# Patient Record
Sex: Male | Born: 1945 | Race: Black or African American | Hispanic: No | Marital: Married | Smoking: Never smoker
Health system: Southern US, Community
[De-identification: ages and names within clinical notes are randomized; demographics above are authoritative.]

## PROBLEM LIST (undated history)

## (undated) DIAGNOSIS — M1990 Unspecified osteoarthritis, unspecified site: Secondary | ICD-10-CM

## (undated) DIAGNOSIS — E785 Hyperlipidemia, unspecified: Secondary | ICD-10-CM

## (undated) DIAGNOSIS — D126 Benign neoplasm of colon, unspecified: Secondary | ICD-10-CM

## (undated) DIAGNOSIS — K625 Hemorrhage of anus and rectum: Secondary | ICD-10-CM

## (undated) DIAGNOSIS — K760 Fatty (change of) liver, not elsewhere classified: Secondary | ICD-10-CM

## (undated) DIAGNOSIS — E66811 Obesity, class 1: Secondary | ICD-10-CM

## (undated) DIAGNOSIS — K56609 Unspecified intestinal obstruction, unspecified as to partial versus complete obstruction: Secondary | ICD-10-CM

## (undated) DIAGNOSIS — K648 Other hemorrhoids: Secondary | ICD-10-CM

## (undated) DIAGNOSIS — K5900 Constipation, unspecified: Secondary | ICD-10-CM

## (undated) DIAGNOSIS — E669 Obesity, unspecified: Secondary | ICD-10-CM

## (undated) DIAGNOSIS — D649 Anemia, unspecified: Secondary | ICD-10-CM

## (undated) DIAGNOSIS — M549 Dorsalgia, unspecified: Secondary | ICD-10-CM

## (undated) DIAGNOSIS — N281 Cyst of kidney, acquired: Secondary | ICD-10-CM

## (undated) DIAGNOSIS — I10 Essential (primary) hypertension: Secondary | ICD-10-CM

## (undated) DIAGNOSIS — M810 Age-related osteoporosis without current pathological fracture: Secondary | ICD-10-CM

## (undated) DIAGNOSIS — R7303 Prediabetes: Secondary | ICD-10-CM

## (undated) DIAGNOSIS — E559 Vitamin D deficiency, unspecified: Secondary | ICD-10-CM

## (undated) DIAGNOSIS — F419 Anxiety disorder, unspecified: Secondary | ICD-10-CM

## (undated) DIAGNOSIS — R739 Hyperglycemia, unspecified: Secondary | ICD-10-CM

## (undated) DIAGNOSIS — K649 Unspecified hemorrhoids: Secondary | ICD-10-CM

## (undated) DIAGNOSIS — R933 Abnormal findings on diagnostic imaging of other parts of digestive tract: Secondary | ICD-10-CM

## (undated) DIAGNOSIS — J189 Pneumonia, unspecified organism: Secondary | ICD-10-CM

## (undated) DIAGNOSIS — R351 Nocturia: Secondary | ICD-10-CM

## (undated) HISTORY — DX: Obesity, class 1: E66.811

## (undated) HISTORY — DX: Unspecified intestinal obstruction, unspecified as to partial versus complete obstruction: K56.609

## (undated) HISTORY — DX: Benign neoplasm of colon, unspecified: D12.6

## (undated) HISTORY — DX: Nocturia: R35.1

## (undated) HISTORY — DX: Hyperlipidemia, unspecified: E78.5

## (undated) HISTORY — DX: Constipation, unspecified: K59.00

## (undated) HISTORY — DX: Anxiety disorder, unspecified: F41.9

## (undated) HISTORY — DX: Essential (primary) hypertension: I10

## (undated) HISTORY — DX: Unspecified osteoarthritis, unspecified site: M19.90

## (undated) HISTORY — PX: COLONOSCOPY: SHX174

## (undated) HISTORY — DX: Anemia, unspecified: D64.9

## (undated) HISTORY — PX: TONSILLECTOMY: SUR1361

## (undated) HISTORY — DX: Age-related osteoporosis without current pathological fracture: M81.0

## (undated) HISTORY — DX: Abnormal findings on diagnostic imaging of other parts of digestive tract: R93.3

## (undated) HISTORY — DX: Cyst of kidney, acquired: N28.1

## (undated) HISTORY — DX: Hyperglycemia, unspecified: R73.9

## (undated) HISTORY — DX: Unspecified hemorrhoids: K64.9

## (undated) HISTORY — DX: Other hemorrhoids: K64.8

## (undated) HISTORY — DX: Vitamin D deficiency, unspecified: E55.9

## (undated) HISTORY — DX: Hemorrhage of anus and rectum: K62.5

## (undated) HISTORY — DX: Obesity, unspecified: E66.9

## (undated) HISTORY — PX: CATARACT EXTRACTION W/ INTRAOCULAR LENS IMPLANT: SHX1309

## (undated) HISTORY — DX: Dorsalgia, unspecified: M54.9

---
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
## 1987-07-01 HISTORY — PX: FRACTURE SURGERY: SHX138

## 2003-07-01 DIAGNOSIS — K648 Other hemorrhoids: Secondary | ICD-10-CM

## 2003-07-01 DIAGNOSIS — K760 Fatty (change of) liver, not elsewhere classified: Secondary | ICD-10-CM

## 2003-07-01 HISTORY — DX: Fatty (change of) liver, not elsewhere classified: K76.0

## 2003-07-01 HISTORY — DX: Other hemorrhoids: K64.8

## 2003-07-01 HISTORY — PX: HEMORRHOID BANDING: SHX5850

## 2008-06-30 HISTORY — PX: KNEE SURGERY: SHX244

## 2010-06-30 HISTORY — PX: KNEE SURGERY: SHX244

## 2011-12-15 LAB — PULMONARY FUNCTION TEST

## 2012-05-30 LAB — HM COLONOSCOPY

## 2012-10-07 ENCOUNTER — Encounter: Payer: Self-pay | Admitting: Family Medicine

## 2012-10-07 ENCOUNTER — Ambulatory Visit (INDEPENDENT_AMBULATORY_CARE_PROVIDER_SITE_OTHER): Admitting: Family Medicine

## 2012-10-07 ENCOUNTER — Ambulatory Visit (HOSPITAL_BASED_OUTPATIENT_CLINIC_OR_DEPARTMENT_OTHER)
Admission: RE | Admit: 2012-10-07 | Discharge: 2012-10-07 | Disposition: A | Discharge status: Home / Self Care | Payer: BC Managed Care – PPO | Source: Ambulatory Visit | Attending: Family Medicine | Admitting: Family Medicine

## 2012-10-07 VITALS — BP 120/84 | HR 70 | Temp 98.0°F | Resp 16 | Ht 70.5 in | Wt 216.1 lb

## 2012-10-07 DIAGNOSIS — M545 Low back pain, unspecified: Secondary | ICD-10-CM

## 2012-10-07 DIAGNOSIS — H00019 Hordeolum externum unspecified eye, unspecified eyelid: Secondary | ICD-10-CM

## 2012-10-07 DIAGNOSIS — M5137 Other intervertebral disc degeneration, lumbosacral region: Secondary | ICD-10-CM | POA: Insufficient documentation

## 2012-10-07 DIAGNOSIS — M549 Dorsalgia, unspecified: Secondary | ICD-10-CM

## 2012-10-07 DIAGNOSIS — H00016 Hordeolum externum left eye, unspecified eyelid: Secondary | ICD-10-CM

## 2012-10-07 DIAGNOSIS — E785 Hyperlipidemia, unspecified: Secondary | ICD-10-CM

## 2012-10-07 DIAGNOSIS — M51379 Other intervertebral disc degeneration, lumbosacral region without mention of lumbar back pain or lower extremity pain: Secondary | ICD-10-CM | POA: Insufficient documentation

## 2012-10-07 DIAGNOSIS — T7840XA Allergy, unspecified, initial encounter: Secondary | ICD-10-CM

## 2012-10-07 DIAGNOSIS — E669 Obesity, unspecified: Secondary | ICD-10-CM

## 2012-10-07 DIAGNOSIS — I10 Essential (primary) hypertension: Secondary | ICD-10-CM

## 2012-10-07 MED ORDER — METHOCARBAMOL 500 MG PO TABS: 500.0000 mg | ORAL_TABLET | Freq: Two times a day (BID) | ORAL | Status: DC | PRN

## 2012-10-07 MED ORDER — IBUPROFEN 400 MG PO TABS: 400.0000 mg | ORAL_TABLET | Freq: Four times a day (QID) | ORAL | Status: DC | PRN

## 2012-10-07 NOTE — Patient Instructions (Addendum)
[facility]  Annual exam with labs prior  Lipid, renal, cbc, psa, hepatic, tsh  Preventive Care for Adults, Male A healthy lifestyle and preventive care can promote health and wellness. Preventive health guidelines for men include the following key practices:  A routine yearly physical is a good way to check with your caregiver about your health and preventative screening. It is a chance to share any concerns and updates on your health, and to receive a thorough exam.  Visit your dentist for a routine exam and preventative care every 6 months. Brush your teeth twice a day and floss once a day. Good oral hygiene prevents tooth decay and gum disease.  The frequency of eye exams is based on your age, health, family medical history, use of contact lenses, and other factors. Follow your caregiver's recommendations for frequency of eye exams.  Eat a healthy diet. Foods like vegetables, fruits, whole grains, low-fat dairy products, and lean protein foods contain the nutrients you need without too many calories. Decrease your intake of foods high in solid fats, added sugars, and salt. Eat the right amount of calories for you.Get information about a proper diet from your caregiver, if necessary.  Regular physical exercise is one of the most important things you can do for your health. Most adults should get at least 150 minutes of moderate-intensity exercise (any activity that increases your heart rate and causes you to sweat) each week. In addition, most adults need muscle-strengthening exercises on 2 or more days a week.  Maintain a healthy weight. The body mass index (BMI) is a screening tool to identify possible weight problems. It provides an estimate of body fat based on height and weight. Your caregiver can help determine your BMI, and can help you achieve or maintain a healthy weight.For adults 20 years and older:  A BMI below 18.5 is considered underweight.  A BMI of 18.5 to 24.9  is normal.  A BMI of 25 to 29.9 is considered overweight.  A BMI of 30 and above is considered obese.  Maintain normal blood lipids and cholesterol levels by exercising and minimizing your intake of saturated fat. Eat a balanced diet with plenty of fruit and vegetables. Blood tests for lipids and cholesterol should begin at age 63 and be repeated every 5 years. If your lipid or cholesterol levels are high, you are over 50, or you are a high risk for heart disease, you may need your cholesterol levels checked more frequently.Ongoing high lipid and cholesterol levels should be treated with medicines if diet and exercise are not effective.  If you smoke, find out from your caregiver how to quit. If you do not use tobacco, do not start.  If you choose to drink alcohol, do not exceed 2 drinks per day. One drink is considered to be 12 ounces (355 mL) of beer, 5 ounces (148 mL) of wine, or 1.5 ounces (44 mL) of liquor.  Avoid use of street drugs. Do not share needles with anyone. Ask for help if you need support or instructions about stopping the use of drugs.  High blood pressure causes heart disease and increases the risk of stroke. Your blood pressure should be checked at least every 1 to 2 years. Ongoing high blood pressure should be treated with medicines, if weight loss and exercise are not effective.  If you are 48 to 65 years old, ask your caregiver if you should take aspirin to prevent heart disease.  Diabetes screening involves  taking a blood sample to check your fasting blood sugar level. This should be done once every 3 years, after age 70, if you are within normal weight and without risk factors for diabetes. Testing should be considered at a younger age or be carried out more frequently if you are overweight and have at least 1 risk factor for diabetes.  Colorectal cancer can be detected and often prevented. Most routine colorectal cancer screening begins at the age of 76 and continues  through age 22. However, your caregiver may recommend screening at an earlier age if you have risk factors for colon cancer. On a yearly basis, your caregiver may provide home test kits to check for hidden blood in the stool. Use of a small camera at the end of a tube, to directly examine the colon (sigmoidoscopy or colonoscopy), can detect the earliest forms of colorectal cancer. Talk to your caregiver about this at age 85, when routine screening begins. Direct examination of the colon should be repeated every 5 to 10 years through age 46, unless early forms of pre-cancerous polyps or small growths are found.  Hepatitis C blood testing is recommended for all people born from [birth year] through 1965 and any individual with known risks for hepatitis C.  Practice safe sex. Use condoms and avoid high-risk sexual practices to reduce the spread of sexually transmitted infections (STIs). STIs include gonorrhea, chlamydia, syphilis, trichomonas, herpes, HPV, and human immunodeficiency virus (HIV). Herpes, HIV, and HPV are viral illnesses that have no cure. They can result in disability, cancer, and death.  A one-time screening for abdominal aortic aneurysm (AAA) and surgical repair of large AAAs by sound wave imaging (ultrasonography) is recommended for ages 76 to 44 years who are current or former smokers.  Healthy men should no longer receive prostate-specific antigen (PSA) blood tests as part of routine cancer screening. Consult with your caregiver about prostate cancer screening.  Testicular cancer screening is not recommended for adult males who have no symptoms. Screening includes self-exam, caregiver exam, and other screening tests. Consult with your caregiver about any symptoms you have or any concerns you have about testicular cancer.  Use sunscreen with skin protection factor (SPF) of 30 or more. Apply sunscreen liberally and repeatedly throughout the day. You should seek shade when your shadow is shorter  than you. Protect yourself by wearing long sleeves, pants, a wide-brimmed hat, and sunglasses year round, whenever you are outdoors.  Once a month, do a whole body skin exam, using a mirror to look at the skin on your back. Notify your caregiver of new moles, moles that have irregular borders, moles that are larger than a pencil eraser, or moles that have changed in shape or color.  Stay current with required immunizations.  Influenza. You need a dose every fall (or winter). The composition of the flu vaccine changes each year, so being vaccinated once is not enough.  Pneumococcal polysaccharide. You need 1 to 2 doses if you smoke cigarettes or if you have certain chronic medical conditions. You need 1 dose at age 33 (or older) if you have never been vaccinated.  Tetanus, diphtheria, pertussis (Tdap, Td). Get 1 dose of Tdap vaccine if you are younger than age 103 years, are over 71 and have contact with an infant, are a Research scientist (physical sciences), or simply want to be protected from whooping cough. After that, you need a Td booster dose every 10 years. Consult your caregiver if you have not had at least 3 tetanus and  diphtheria-containing shots sometime in your life or have a deep or dirty wound.  HPV. This vaccine is recommended for males 13 through 65 years of age. This vaccine may be given to men 22 through 65 years of age who have not completed the 3 dose series. It is recommended for men through age 55 who have sex with men or whose immune system is weakened because of HIV infection, other illness, or medications. The vaccine is given in 3 doses over 6 months.  Measles, mumps, rubella (MMR). You need at least 1 dose of MMR if you were born in 1957 or later. You may also need a 2nd dose.  Meningococcal. If you are age 4 to 69 years and a Orthoptist living in a residence hall, or have one of several medical conditions, you need to get vaccinated against meningococcal disease. You may also  need additional booster doses.  Zoster (shingles). If you are age 23 years or older, you should get this vaccine.  Varicella (chickenpox). If you have never had chickenpox or you were vaccinated but received only 1 dose, talk to your caregiver to find out if you need this vaccine.  Hepatitis A. You need this vaccine if you have a specific risk factor for hepatitis A virus infection, or you simply wish to be protected from this disease. The vaccine is usually given as 2 doses, 6 to 18 months apart.  Hepatitis B. You need this vaccine if you have a specific risk factor for hepatitis B virus infection or you simply wish to be protected from this disease. The vaccine is given in 3 doses, usually over 6 months. Preventative Service / Frequency Ages 40 to 57  Blood pressure check.** / Every 1 to 2 years.  Lipid and cholesterol check.** / Every 5 years beginning at age 46.  Hepatitis C blood test.** / For any individual with known risks for hepatitis C.  Skin self-exam. / Monthly.  Influenza immunization.** / Every year.  Pneumococcal polysaccharide immunization.** / 1 to 2 doses if you smoke cigarettes or if you have certain chronic medical conditions.  Tetanus, diphtheria, pertussis (Tdap,Td) immunization. / A one-time dose of Tdap vaccine. After that, you need a Td booster dose every 10 years.  HPV immunization. / 3 doses over 6 months, if 26 and younger.  Measles, mumps, rubella (MMR) immunization. / You need at least 1 dose of MMR if you were born in 1957 or later. You may also need a 2nd dose.  Meningococcal immunization. / 1 dose if you are age 48 to 25 years and a Orthoptist living in a residence hall, or have one of several medical conditions, you need to get vaccinated against meningococcal disease. You may also need additional booster doses.  Varicella immunization.** / Consult your caregiver.  Hepatitis A immunization.** / Consult your caregiver. 2 doses, 6 to  18 months apart.  Hepatitis B immunization.** / Consult your caregiver. 3 doses usually over 6 months. Ages 53 to 81  Blood pressure check.** / Every 1 to 2 years.  Lipid and cholesterol check.** / Every 5 years beginning at age 61.  Fecal occult blood test (FOBT) of stool. / Every year beginning at age 55 and continuing until age 22. You may not have to do this test if you get colonoscopy every 10 years.  Flexible sigmoidoscopy** or colonoscopy.** / Every 5 years for a flexible sigmoidoscopy or every 10 years for a colonoscopy beginning at age 61 and continuing  until age 49.  Hepatitis C blood test.** / For all people born from [birth year] through 1965 and any individual with known risks for hepatitis C.  Skin self-exam. / Monthly.  Influenza immunization.** / Every year.  Pneumococcal polysaccharide immunization.** / 1 to 2 doses if you smoke cigarettes or if you have certain chronic medical conditions.  Tetanus, diphtheria, pertussis (Tdap/Td) immunization.** / A one-time dose of Tdap vaccine. After that, you need a Td booster dose every 10 years.  Measles, mumps, rubella (MMR) immunization. / You need at least 1 dose of MMR if you were born in 1957 or later. You may also need a 2nd dose.  Varicella immunization.**/ Consult your caregiver.  Meningococcal immunization.** / Consult your caregiver.  Hepatitis A immunization.** / Consult your caregiver. 2 doses, 6 to 18 months apart.  Hepatitis B immunization.** / Consult your caregiver. 3 doses, usually over 6 months. Ages 73 and over  Blood pressure check.** / Every 1 to 2 years.  Lipid and cholesterol check.**/ Every 5 years beginning at age 67.  Fecal occult blood test (FOBT) of stool. / Every year beginning at age 23 and continuing until age 72. You may not have to do this test if you get colonoscopy every 10 years.  Flexible sigmoidoscopy** or colonoscopy.** / Every 5 years for a flexible sigmoidoscopy or every 10 years for a  colonoscopy beginning at age 42 and continuing until age 42.  Hepatitis C blood test.** / For all people born from [birth year] through 1965 and any individual with known risks for hepatitis C.  Abdominal aortic aneurysm (AAA) screening.** / A one-time screening for ages 77 to 62 years who are current or former smokers.  Skin self-exam. / Monthly.  Influenza immunization.** / Every year.  Pneumococcal polysaccharide immunization.** / 1 dose at age 41 (or older) if you have never been vaccinated.  Tetanus, diphtheria, pertussis (Tdap, Td) immunization. / A one-time dose of Tdap vaccine if you are over 65 and have contact with an infant, are a Research scientist (physical sciences), or simply want to be protected from whooping cough. After that, you need a Td booster dose every 10 years.  Varicella immunization. ** / Consult your caregiver.  Meningococcal immunization.** / Consult your caregiver.  Hepatitis A immunization. ** / Consult your caregiver. 2 doses, 6 to 18 months apart.  Hepatitis B immunization.** / Check with your caregiver. 3 doses, usually over 6 months. **Family history and personal history of risk and conditions may change your caregiver's recommendations. Document Released: 08/12/2001 Document Revised: 09/08/2011 Document Reviewed: 11/11/2010 [facility] Patient Information 2013 [place], [place].

## 2012-10-10 ENCOUNTER — Encounter: Payer: Self-pay | Admitting: Family Medicine

## 2012-10-10 DIAGNOSIS — M545 Low back pain, unspecified: Secondary | ICD-10-CM | POA: Insufficient documentation

## 2012-10-10 DIAGNOSIS — Z86010 Personal history of colonic polyps: Secondary | ICD-10-CM | POA: Insufficient documentation

## 2012-10-10 DIAGNOSIS — H00019 Hordeolum externum unspecified eye, unspecified eyelid: Secondary | ICD-10-CM | POA: Insufficient documentation

## 2012-10-10 DIAGNOSIS — T7840XA Allergy, unspecified, initial encounter: Secondary | ICD-10-CM | POA: Insufficient documentation

## 2012-10-10 DIAGNOSIS — M549 Dorsalgia, unspecified: Secondary | ICD-10-CM

## 2012-10-10 DIAGNOSIS — E669 Obesity, unspecified: Secondary | ICD-10-CM | POA: Insufficient documentation

## 2012-10-10 DIAGNOSIS — E785 Hyperlipidemia, unspecified: Secondary | ICD-10-CM | POA: Insufficient documentation

## 2012-10-10 DIAGNOSIS — I10 Essential (primary) hypertension: Secondary | ICD-10-CM | POA: Insufficient documentation

## 2012-10-10 HISTORY — DX: Dorsalgia, unspecified: M54.9

## 2012-10-10 NOTE — Assessment & Plan Note (Signed)
Well controlled no changes to meds 

## 2012-10-10 NOTE — Assessment & Plan Note (Signed)
Consider DASH diet and increase exercise.

## 2012-10-10 NOTE — Assessment & Plan Note (Signed)
Prescribed Flonase, encouraged Zyrtec daily

## 2012-10-10 NOTE — Progress Notes (Signed)
Patient ID: [name], male   DOB: 10/22/1945, 67 y.o.   MRN: [id number] [name] [id number] 1945/09/28 10/10/2012      Progress Note-Follow Up  Subjective  Chief Complaint  Chief Complaint  Patient presents with  . Establish Care    Pt new to establish care. Had colonoscopy 05/2012. Up to date with tdap and shingels vaccines per pt.  . Back Pain    Pt reports intermittent low back pain x 3-4 months. Worse with sitting and lying.   . Stye    Pt thinks he may have a stye on his left eye. Reports that he gets one every year.    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 His wife already 6 cannot clinic. He is a medical history which includes hypertension, hyperlipidemia chronic back pain. Generally is doing well. His last colonoscopy in December 2013 was diagnosed with hemorrhoids and polyps. Reports a lifelong history of poor sleeping but this is not worse than usual. Has trouble with spring allergies frequently. Is having a little flare in his left eye at the moment. Says he frequently develops styes when his allergies flare and he has a mild one in his left eye. Has chronic back pain has been having more trouble for the last 3-4 months. It is the worst pain occurs when he changes positions specifically from sitting to lying and lying to sitting. He is following with acupuncture and chiropractic and finds that somewhat helpful. No neurologic complaints, chest pain or palpitations, shortness of breath, GI or GU concerns otherwise  Past Medical History  Diagnosis Date  . History of colon polyps   . Back pain 10/10/2012  . Arthritis   . Hyperlipidemia   . Hypertension   . Obesity (BMI 30.0-34.9)   . Allergic state 10/10/2012    Past Surgical History  Procedure Laterality Date  . Tonsillectomy  1957  . Fracture surgery Left 1989    leg  . Knee surgery Left 2010    arthroscopy, torn carilage  . Knee surgery Right 2012    arthroscopy    Family  History  Problem Relation Age of Onset  . Stroke Mother   . Arthritis Father   . Cancer Father     liver  . Hyperlipidemia Sister   . Hypertension Sister   . Diabetes Brother   . Hyperlipidemia Sister   . Hypertension Sister     History   Social History  . Marital Status: Married    Spouse Name: N/A    Number of Children: 1  . Years of Education: N/A   Occupational History  .  [employer]   Social History Main Topics  . Smoking status: Never Smoker   . Smokeless tobacco: Never Used  . Alcohol Use: 1.0 oz/week    2 drink(s) per week  . Drug Use: No  . Sexually Active: No   Other Topics Concern  . Not on file   Social History Narrative  . No narrative on file    No current outpatient prescriptions on file prior to visit.   No current facility-administered medications on file prior to visit.    No Known Allergies  Review of Systems  Review of Systems  Constitutional: Negative for fever and malaise/fatigue.  HENT: Negative for congestion.   Eyes: Negative for discharge.  Respiratory: Negative for shortness of breath.   Cardiovascular: Negative for chest pain, palpitations and leg swelling.  Gastrointestinal: Negative for nausea,  abdominal pain and diarrhea.  Genitourinary: Negative for dysuria.  Musculoskeletal: Positive for myalgias, back pain and joint pain. Negative for falls.  Skin: Negative for rash.  Neurological: Negative for loss of consciousness and headaches.  Endo/Heme/Allergies: Negative for polydipsia.  Psychiatric/Behavioral: Negative for depression and suicidal ideas. The patient is not nervous/anxious and does not have insomnia.     Objective  BP 120/84  Pulse 70  Temp(Src) 98 F (36.7 C) (Oral)  Resp 16  Ht 5' 10.5" (1.791 m)  Wt 216 lb 1.9 oz (98.031 kg)  BMI 30.56 kg/m2  SpO2 99%  Physical Exam  Physical Exam  Constitutional: He is oriented to person, place, and time and well-developed, well-nourished, and in no  distress. No distress.  HENT:  Head: Normocephalic and atraumatic.  Eyes: Conjunctivae are normal.  Neck: Neck supple. No thyromegaly present.  Cardiovascular: Normal rate, regular rhythm and normal heart sounds.   No murmur heard. Pulmonary/Chest: Effort normal and breath sounds normal. No respiratory distress.  Abdominal: He exhibits no distension and no mass. There is no tenderness.  Musculoskeletal: He exhibits no edema.  Neurological: He is alert and oriented to person, place, and time.  Skin: Skin is warm.  Psychiatric: Memory, affect and judgment normal.      Assessment & Plan  Stye Occur occasionally with allergy flares, continue eye drops and apply warm compresses qid  Allergic state Prescribed Flonase, encouraged Zyrtec daily  Hypertension Well controlled no changes to meds.  Obesity (BMI 30.0-34.9) Consider DASH diet and increase exercise.  Hyperlipidemia Avoid trans fats, start a krill oil cap daily. Continue Livalo daily  Back pain Follows with chiropractor and Acupuncturist, may use NSAIDs and encouraged increased exercise.

## 2012-10-10 NOTE — Assessment & Plan Note (Signed)
Follows with chiropractor and Acupuncturist, may use NSAIDs and encouraged increased exercise.

## 2012-10-10 NOTE — Assessment & Plan Note (Signed)
Occur occasionally with allergy flares, continue eye drops and apply warm compresses qid

## 2012-10-10 NOTE — Assessment & Plan Note (Signed)
Avoid trans fats, start a krill oil cap daily. Continue Livalo daily

## 2012-10-18 ENCOUNTER — Telehealth: Payer: Self-pay | Admitting: Family Medicine

## 2012-10-18 MED ORDER — METOPROLOL TARTRATE 50 MG PO TABS: 50.0000 mg | ORAL_TABLET | Freq: Every day | ORAL | Status: DC

## 2012-10-18 NOTE — Telephone Encounter (Signed)
metroprolol tartrate  50 mg qty 30 take 1 per day.   They are going out of town and need to pick this up today

## 2012-11-15 ENCOUNTER — Telehealth: Payer: Self-pay | Admitting: Family Medicine

## 2012-11-15 DIAGNOSIS — M545 Low back pain, unspecified: Secondary | ICD-10-CM

## 2012-11-15 MED ORDER — IBUPROFEN 400 MG PO TABS: 400.0000 mg | ORAL_TABLET | Freq: Four times a day (QID) | ORAL | Status: DC | PRN

## 2012-11-15 NOTE — Telephone Encounter (Signed)
Refill- ibuprofen 400mg  tablet. Take one tablet(400mg  total) by mouth every six hours as needed for pain. Qty 30 last fill 4.10.14

## 2012-11-23 ENCOUNTER — Other Ambulatory Visit: Payer: Self-pay | Admitting: Family Medicine

## 2012-11-23 ENCOUNTER — Telehealth: Payer: Self-pay | Admitting: *Deleted

## 2012-11-23 DIAGNOSIS — Z0000 Encounter for general adult medical examination without abnormal findings: Secondary | ICD-10-CM

## 2012-11-23 LAB — TSH: TSH: 1.221 u[IU]/mL (ref 0.350–4.500)

## 2012-11-23 LAB — RENAL FUNCTION PANEL
Albumin: 4.3 g/dL (ref 3.5–5.2)
BUN: 12 mg/dL (ref 6–23)
CO2: 25 mEq/L (ref 19–32)
Calcium: 9.4 mg/dL (ref 8.4–10.5)
Chloride: 110 mEq/L (ref 96–112)
Creat: 1.09 mg/dL (ref 0.50–1.35)
Glucose, Bld: 113 mg/dL — ABNORMAL HIGH (ref 70–99)
Phosphorus: 3.3 mg/dL (ref 2.3–4.6)
Potassium: 4.5 mEq/L (ref 3.5–5.3)
Sodium: 140 mEq/L (ref 135–145)

## 2012-11-23 LAB — HEPATIC FUNCTION PANEL
ALT: 23 U/L (ref 0–53)
AST: 19 U/L (ref 0–37)
Albumin: 4.3 g/dL (ref 3.5–5.2)
Alkaline Phosphatase: 76 U/L (ref 39–117)
Bilirubin, Direct: 0.1 mg/dL (ref 0.0–0.3)
Indirect Bilirubin: 0.3 mg/dL (ref 0.0–0.9)
Total Bilirubin: 0.4 mg/dL (ref 0.3–1.2)
Total Protein: 7 g/dL (ref 6.0–8.3)

## 2012-11-23 LAB — CBC WITH DIFFERENTIAL/PLATELET
Basophils Absolute: 0 10*3/uL (ref 0.0–0.1)
Basophils Relative: 0 % (ref 0–1)
Eosinophils Absolute: 0.4 10*3/uL (ref 0.0–0.7)
Eosinophils Relative: 7 % — ABNORMAL HIGH (ref 0–5)
HCT: 38.8 % — ABNORMAL LOW (ref 39.0–52.0)
Hemoglobin: 13.3 g/dL (ref 13.0–17.0)
Lymphocytes Relative: 39 % (ref 12–46)
Lymphs Abs: 2 10*3/uL (ref 0.7–4.0)
MCH: 27.7 pg (ref 26.0–34.0)
MCHC: 34.3 g/dL (ref 30.0–36.0)
MCV: 80.7 fL (ref 78.0–100.0)
Monocytes Absolute: 0.5 10*3/uL (ref 0.1–1.0)
Monocytes Relative: 9 % (ref 3–12)
Neutro Abs: 2.2 10*3/uL (ref 1.7–7.7)
Neutrophils Relative %: 45 % (ref 43–77)
Platelets: 199 10*3/uL (ref 150–400)
RBC: 4.81 MIL/uL (ref 4.22–5.81)
RDW: 13.7 % (ref 11.5–15.5)
WBC: 5 10*3/uL (ref 4.0–10.5)

## 2012-11-23 LAB — PSA: PSA: 0.8 ng/mL (ref <=4.00)

## 2012-11-23 LAB — LIPID PANEL
Cholesterol: 148 mg/dL (ref 0–200)
HDL: 40 mg/dL (ref >39)
LDL Cholesterol: 84 mg/dL (ref 0–99)
Total CHOL/HDL Ratio: 3.7 Ratio
Triglycerides: 121 mg/dL (ref <150)
VLDL: 24 mg/dL (ref 0–40)

## 2012-11-23 NOTE — Telephone Encounter (Signed)
Pt presented to the lab. Orders entered per 10/27/12 office note as below:  Annual exam with labs prior Lipid, renal, cbc, psa, hepatic, tsh

## 2012-11-24 LAB — HEMOGLOBIN A1C
Hgb A1c MFr Bld: 5.8 % — ABNORMAL HIGH (ref <5.7)
Mean Plasma Glucose: 120 mg/dL — ABNORMAL HIGH (ref <117)

## 2012-11-26 ENCOUNTER — Ambulatory Visit (INDEPENDENT_AMBULATORY_CARE_PROVIDER_SITE_OTHER): Admitting: Family Medicine

## 2012-11-26 ENCOUNTER — Encounter: Payer: Self-pay | Admitting: Family Medicine

## 2012-11-26 VITALS — BP 112/84 | HR 55 | Temp 98.4°F | Ht 70.5 in | Wt 217.0 lb

## 2012-11-26 DIAGNOSIS — Z86010 Personal history of colonic polyps: Secondary | ICD-10-CM

## 2012-11-26 DIAGNOSIS — I10 Essential (primary) hypertension: Secondary | ICD-10-CM

## 2012-11-26 DIAGNOSIS — Z5189 Encounter for other specified aftercare: Secondary | ICD-10-CM

## 2012-11-26 DIAGNOSIS — Z0000 Encounter for general adult medical examination without abnormal findings: Secondary | ICD-10-CM

## 2012-11-26 DIAGNOSIS — E669 Obesity, unspecified: Secondary | ICD-10-CM

## 2012-11-26 DIAGNOSIS — F411 Generalized anxiety disorder: Secondary | ICD-10-CM

## 2012-11-26 DIAGNOSIS — M549 Dorsalgia, unspecified: Secondary | ICD-10-CM

## 2012-11-26 DIAGNOSIS — T7840XD Allergy, unspecified, subsequent encounter: Secondary | ICD-10-CM

## 2012-11-26 MED ORDER — ALPRAZOLAM 1 MG PO TABS: 1.0000 mg | ORAL_TABLET | Freq: Every day | ORAL | Status: DC | PRN

## 2012-11-26 NOTE — Patient Instructions (Addendum)
Try the Salon Pas patches at night Krill oil caps, MegaRed 1 daily  Probiotic such as Digestive Advantage by Schiff, daily  Labs prior to visit, lipid, renal, cbc, tsh, hepatic, hgba1c  Back Pain, Adult Low back pain is very common. About 1 in 5 people have back pain.The cause of low back pain is rarely dangerous. The pain often gets better over time.About half of people with a sudden onset of back pain feel better in just 2 weeks. About 8 in 10 people feel better by 6 weeks.  CAUSES Some common causes of back pain include:  Strain of the muscles or ligaments supporting the spine.  Wear and tear (degeneration) of the spinal discs.  Arthritis.  Direct injury to the back. DIAGNOSIS Most of the time, the direct cause of low back pain is not known.However, back pain can be treated effectively even when the exact cause of the pain is unknown.Answering your caregiver's questions about your overall health and symptoms is one of the most accurate ways to make sure the cause of your pain is not dangerous. If your caregiver needs more information, he or she may order lab work or imaging tests (X-rays or MRIs).However, even if imaging tests show changes in your back, this usually does not require surgery. HOME CARE INSTRUCTIONS For many people, back pain returns.Since low back pain is rarely dangerous, it is often a condition that people can learn to [facility] their own.   Remain active. It is stressful on the back to sit or stand in one place. Do not sit, drive, or stand in one place for more than 30 minutes at a time. Take short walks on level surfaces as soon as pain allows.Try to increase the length of time you walk each day.  Do not stay in bed.Resting more than 1 or 2 days can delay your recovery.  Do not avoid exercise or work.Your body is made to move.It is not dangerous to be active, even though your back may hurt.Your back will likely heal faster if you return to being active  before your pain is gone.  Pay attention to your body when you bend and lift. Many people have less discomfortwhen lifting if they bend their knees, keep the load close to their bodies,and avoid twisting. Often, the most comfortable positions are those that put less stress on your recovering back.  Find a comfortable position to sleep. Use a firm mattress and lie on your side with your knees slightly bent. If you lie on your back, put a pillow under your knees.  Only take over-the-counter or prescription medicines as directed by your caregiver. Over-the-counter medicines to reduce pain and inflammation are often the most helpful.Your caregiver may prescribe muscle relaxant drugs.These medicines help dull your pain so you can more quickly return to your normal activities and healthy exercise.  Put ice on the injured area.  Put ice in a plastic bag.  Place a towel between your skin and the bag.  Leave the ice on for 15-20 minutes, 3-4 times a day for the first 2 to 3 days. After that, ice and heat may be alternated to reduce pain and spasms.  Ask your caregiver about trying back exercises and gentle massage. This may be of some benefit.  Avoid feeling anxious or stressed.Stress increases muscle tension and can worsen back pain.It is important to recognize when you are anxious or stressed and learn ways to manage it.Exercise is a great option. SEEK MEDICAL CARE IF:  You  have pain that is not relieved with rest or medicine.  You have pain that does not improve in 1 week.  You have new symptoms.  You are generally not feeling well. SEEK IMMEDIATE MEDICAL CARE IF:   You have pain that radiates from your back into your legs.  You develop new bowel or bladder control problems.  You have unusual weakness or numbness in your arms or legs.  You develop nausea or vomiting.  You develop abdominal pain.  You feel faint. Document Released: 06/16/2005 Document Revised: 12/16/2011  Document Reviewed: 11/04/2010 [facility] Patient Information 2014 [place], [place].

## 2012-11-26 NOTE — Progress Notes (Signed)
Patient ID: [name], male   DOB: [birth year], 67 y.o.   MRN: [id number] [name] [id number] 16-Nov-1945 11/26/2012      Progress Note-Follow Up  Subjective  Chief Complaint  Chief Complaint  Patient presents with  . Annual Exam    physical    HPI   patient is a 67 year old African American male who is in today for annual exam. His greatest complaint is of persistent low back pain. He has used chiropractic, acupuncture, physical therapy in the past without significant improvement. He has stiffness most notably after prolonged sitting and twisting in the morning. No radicular symptoms. No incontinence. No other recent complaints. No recent illness, fevers, chest pain, palpitations, shortness of breath, GI or GU concerns  Past Medical History  Diagnosis Date  . History of colon polyps   . Back pain 10/10/2012  . Arthritis   . Hyperlipidemia   . Hypertension   . Obesity (BMI 30.0-34.9)   . Allergic state 10/10/2012    Past Surgical History  Procedure Laterality Date  . Tonsillectomy  1957  . Fracture surgery Left 1989    leg  . Knee surgery Left 2010    arthroscopy, torn carilage  . Knee surgery Right 2012    arthroscopy    Family History  Problem Relation Age of Onset  . Stroke Mother   . Arthritis Father   . Cancer Father     liver, atomic testing in [employer]  . Hyperlipidemia Sister   . Hypertension Sister   . Obesity Sister   . Diabetes Sister   . Diabetes Brother   . Hyperlipidemia Sister   . Hypertension Sister   . Obesity Sister   . Diabetes Sister   . Aneurysm Brother     brain  . COPD Brother     History   Social History  . Marital Status: Married    Spouse Name: N/A    Number of Children: 1  . Years of Education: N/A   Occupational History  .  [employer]   Social History Main Topics  . Smoking status: Never Smoker   . Smokeless tobacco: Never Used  . Alcohol Use: 1.0 oz/week    2 drink(s) per week  . Drug Use: No  .  Sexually Active: No   Other Topics Concern  . Not on file   Social History Narrative  . No narrative on file    Current Outpatient Prescriptions on File Prior to Visit  Medication Sig Dispense Refill  . alfuzosin (UROXATRAL) 10 MG 24 hr tablet Take 10 mg by mouth daily.      Marland Kitchen ALPRAZolam (XANAX) 0.5 MG tablet Take 0.5 mg by mouth daily as needed for anxiety.      Marland Kitchen amLODipine-benazepril (LOTREL) 5-20 MG per capsule Take 1 capsule by mouth every morning.      Marland Kitchen azelastine (OPTIVAR) 0.05 % ophthalmic solution Place 1 drop into the left eye 2 (two) times daily.      . fluticasone (FLONASE) 50 MCG/ACT nasal spray Place 1 spray into the nose as needed.      Marland Kitchen ibuprofen (ADVIL,MOTRIN) 400 MG tablet Take 1 tablet (400 mg total) by mouth every 6 (six) hours as needed for pain.  30 tablet  3  . LIVALO 2 MG TABS Take 2 mg by mouth daily.      . methocarbamol (ROBAXIN) 500 MG tablet Take 1 tablet (500 mg total) by mouth 2 (two) times daily as needed.  60 tablet  1  . metoprolol (LOPRESSOR) 50 MG tablet Take 1 tablet (50 mg total) by mouth daily.  30 tablet  4   No current facility-administered medications on file prior to visit.    No Known Allergies  Review of Systems  Review of Systems  Constitutional: Negative for fever and malaise/fatigue.  HENT: Negative for congestion.   Eyes: Negative for discharge.  Respiratory: Negative for shortness of breath.   Cardiovascular: Negative for chest pain, palpitations and leg swelling.  Gastrointestinal: Positive for constipation. Negative for nausea, abdominal pain and diarrhea.  Genitourinary: Negative for dysuria.  Musculoskeletal: Negative for falls.  Skin: Negative for rash.  Neurological: Negative for loss of consciousness and headaches.  Endo/Heme/Allergies: Negative for polydipsia.  Psychiatric/Behavioral: Negative for depression and suicidal ideas. The patient is not nervous/anxious and does not have insomnia.     Objective  BP  112/84  Pulse 55  Temp(Src) 98.4 F (36.9 C) (Oral)  Ht 5' 10.5" (1.791 m)  Wt 217 lb 0.6 oz (98.449 kg)  BMI 30.69 kg/m2  SpO2 97%  Physical Exam  Physical Exam  Constitutional: He is oriented to person, place, and time and well-developed, well-nourished, and in no distress. No distress.  HENT:  Head: Normocephalic and atraumatic.  Eyes: Conjunctivae are normal.  Neck: Neck supple. No thyromegaly present.  Cardiovascular: Normal rate, regular rhythm and normal heart sounds.   No murmur heard. Pulmonary/Chest: Effort normal and breath sounds normal. No respiratory distress.  Abdominal: He exhibits no distension and no mass. There is no tenderness.  Musculoskeletal: He exhibits no edema.  Neurological: He is alert and oriented to person, place, and time.  Skin: Skin is warm.  Psychiatric: Memory, affect and judgment normal.    Lab Results  Component Value Date   TSH 1.221 11/23/2012   Lab Results  Component Value Date   WBC 5.0 11/23/2012   HGB 13.3 11/23/2012   HCT 38.8* 11/23/2012   MCV 80.7 11/23/2012   PLT 199 11/23/2012   Lab Results  Component Value Date   CREATININE 1.09 11/23/2012   BUN 12 11/23/2012   NA 140 11/23/2012   K 4.5 11/23/2012   CL 110 11/23/2012   CO2 25 11/23/2012   Lab Results  Component Value Date   ALT 23 11/23/2012   AST 19 11/23/2012   ALKPHOS 76 11/23/2012   BILITOT 0.4 11/23/2012   Lab Results  Component Value Date   CHOL 148 11/23/2012   Lab Results  Component Value Date   HDL 40 11/23/2012   Lab Results  Component Value Date   LDLCALC 84 11/23/2012   Lab Results  Component Value Date   TRIG 121 11/23/2012   Lab Results  Component Value Date   CHOLHDL 3.7 11/23/2012     Assessment & Plan  Back pain methocarbamal marginally helpful. Has tried PT, chiropractic and acupuncture in past. Referred to ortho further consideratio at this time. Xray did confirm degenerative changes.  Allergic state No changes today, no flare in  allergies at this time  Hypertension Well controlled, no changes.   Obesity (BMI 30.0-34.9) Encouraged DASH diet, increase activity as tolerated  History of colon polyps Avoid trans fats, incrase exercise add krill oil caps  Preventative health care Encouraged heart healthy diet, regular exercise, adequate sleep. UTD with colonoscopy.

## 2012-11-26 NOTE — Assessment & Plan Note (Addendum)
methocarbamal marginally helpful. Has tried PT, chiropractic and acupuncture in past. Referred to ortho further consideratio at this time. Xray did confirm degenerative changes.

## 2012-11-28 ENCOUNTER — Encounter: Payer: Self-pay | Admitting: Family Medicine

## 2012-11-28 DIAGNOSIS — Z0000 Encounter for general adult medical examination without abnormal findings: Secondary | ICD-10-CM | POA: Insufficient documentation

## 2012-11-28 DIAGNOSIS — Z860100 Personal history of colon polyps, unspecified: Secondary | ICD-10-CM | POA: Insufficient documentation

## 2012-11-28 NOTE — Assessment & Plan Note (Signed)
Encouraged heart healthy diet, regular exercise, adequate sleep. UTD with colonoscopy.

## 2012-11-28 NOTE — Assessment & Plan Note (Signed)
Encouraged DASH diet, increase activity as tolerated 

## 2012-11-28 NOTE — Assessment & Plan Note (Signed)
Well controlled, no changes 

## 2012-11-28 NOTE — Assessment & Plan Note (Signed)
Avoid trans fats, incrase exercise add krill oil caps

## 2012-11-28 NOTE — Assessment & Plan Note (Signed)
No changes today, no flare in allergies at this time

## 2012-12-15 ENCOUNTER — Telehealth: Payer: Self-pay | Admitting: Family Medicine

## 2012-12-15 NOTE — Telephone Encounter (Signed)
Received medical records from [facility]

## 2013-03-07 ENCOUNTER — Other Ambulatory Visit: Payer: Self-pay | Admitting: Family Medicine

## 2013-03-14 ENCOUNTER — Other Ambulatory Visit: Payer: Self-pay | Admitting: Family Medicine

## 2013-03-21 ENCOUNTER — Other Ambulatory Visit: Payer: Self-pay | Admitting: Family Medicine

## 2013-03-28 ENCOUNTER — Other Ambulatory Visit: Payer: Self-pay | Admitting: Family Medicine

## 2013-03-28 NOTE — Telephone Encounter (Signed)
Rx request to pharmacy/[facility]  

## 2013-04-25 ENCOUNTER — Other Ambulatory Visit: Payer: Self-pay | Admitting: Family Medicine

## 2013-05-16 ENCOUNTER — Encounter: Payer: Self-pay | Admitting: Family Medicine

## 2013-05-16 ENCOUNTER — Ambulatory Visit (INDEPENDENT_AMBULATORY_CARE_PROVIDER_SITE_OTHER): Payer: Medicare Other | Admitting: Family Medicine

## 2013-05-16 VITALS — BP 118/82 | HR 55 | Temp 98.3°F | Ht 70.5 in | Wt 221.1 lb

## 2013-05-16 DIAGNOSIS — E785 Hyperlipidemia, unspecified: Secondary | ICD-10-CM | POA: Diagnosis not present

## 2013-05-16 DIAGNOSIS — R059 Cough, unspecified: Secondary | ICD-10-CM

## 2013-05-16 DIAGNOSIS — R05 Cough: Secondary | ICD-10-CM | POA: Diagnosis not present

## 2013-05-16 DIAGNOSIS — M25511 Pain in right shoulder: Secondary | ICD-10-CM

## 2013-05-16 DIAGNOSIS — M1990 Unspecified osteoarthritis, unspecified site: Secondary | ICD-10-CM

## 2013-05-16 DIAGNOSIS — R7309 Other abnormal glucose: Secondary | ICD-10-CM | POA: Diagnosis not present

## 2013-05-16 DIAGNOSIS — R739 Hyperglycemia, unspecified: Secondary | ICD-10-CM

## 2013-05-16 DIAGNOSIS — I10 Essential (primary) hypertension: Secondary | ICD-10-CM | POA: Diagnosis not present

## 2013-05-16 DIAGNOSIS — M25519 Pain in unspecified shoulder: Secondary | ICD-10-CM

## 2013-05-16 DIAGNOSIS — J209 Acute bronchitis, unspecified: Secondary | ICD-10-CM

## 2013-05-16 DIAGNOSIS — M19019 Primary osteoarthritis, unspecified shoulder: Secondary | ICD-10-CM

## 2013-05-16 DIAGNOSIS — M129 Arthropathy, unspecified: Secondary | ICD-10-CM

## 2013-05-16 LAB — LIPID PANEL
Cholesterol: 143 mg/dL (ref 0–200)
HDL: 39 mg/dL — ABNORMAL LOW (ref >39)
LDL Cholesterol: 80 mg/dL (ref 0–99)
Total CHOL/HDL Ratio: 3.7 Ratio
Triglycerides: 121 mg/dL (ref <150)
VLDL: 24 mg/dL (ref 0–40)

## 2013-05-16 LAB — CBC
HCT: 38.9 % — ABNORMAL LOW (ref 39.0–52.0)
Hemoglobin: 13.6 g/dL (ref 13.0–17.0)
MCH: 28.6 pg (ref 26.0–34.0)
MCHC: 35 g/dL (ref 30.0–36.0)
MCV: 81.9 fL (ref 78.0–100.0)
Platelets: 193 10*3/uL (ref 150–400)
RBC: 4.75 MIL/uL (ref 4.22–5.81)
RDW: 13.6 % (ref 11.5–15.5)
WBC: 5.4 10*3/uL (ref 4.0–10.5)

## 2013-05-16 LAB — HEPATIC FUNCTION PANEL
ALT: 26 U/L (ref 0–53)
AST: 28 U/L (ref 0–37)
Albumin: 4.3 g/dL (ref 3.5–5.2)
Alkaline Phosphatase: 83 U/L (ref 39–117)
Bilirubin, Direct: 0.1 mg/dL (ref 0.0–0.3)
Indirect Bilirubin: 0.4 mg/dL (ref 0.0–0.9)
Total Bilirubin: 0.5 mg/dL (ref 0.3–1.2)
Total Protein: 7.3 g/dL (ref 6.0–8.3)

## 2013-05-16 LAB — RENAL FUNCTION PANEL
Albumin: 4.3 g/dL (ref 3.5–5.2)
BUN: 14 mg/dL (ref 6–23)
CO2: 28 mEq/L (ref 19–32)
Calcium: 9.7 mg/dL (ref 8.4–10.5)
Chloride: 105 mEq/L (ref 96–112)
Creat: 1.12 mg/dL (ref 0.50–1.35)
Glucose, Bld: 97 mg/dL (ref 70–99)
Phosphorus: 3.3 mg/dL (ref 2.3–4.6)
Potassium: 4.7 mEq/L (ref 3.5–5.3)
Sodium: 141 mEq/L (ref 135–145)

## 2013-05-16 LAB — TSH: TSH: 1.08 u[IU]/mL (ref 0.350–4.500)

## 2013-05-16 LAB — HEMOGLOBIN A1C
Hgb A1c MFr Bld: 5.8 % — ABNORMAL HIGH (ref <5.7)
Mean Plasma Glucose: 120 mg/dL — ABNORMAL HIGH (ref <117)

## 2013-05-16 MED ORDER — DOXYCYCLINE HYCLATE 100 MG PO TABS: 100.0000 mg | ORAL_TABLET | Freq: Two times a day (BID) | ORAL | Status: DC

## 2013-05-16 MED ORDER — HYDROCODONE-HOMATROPINE 5-1.5 MG/5ML PO SYRP: 5.0000 mL | ORAL_SOLUTION | Freq: Two times a day (BID) | ORAL | Status: DC | PRN

## 2013-05-16 NOTE — Patient Instructions (Addendum)
Mucinex 600 mg 1 tab po twice x 10 days Probiotic such as Digestive Advantage or a generic Zinc such as Coldeeze or Zicam Start antibiotic if not improving  Try Aspercreme or Icy Hot creams or salon pas patches  Upper Respiratory Infection, Adult An upper respiratory infection (URI) is also sometimes known as the common cold. The upper respiratory tract includes the nose, sinuses, throat, trachea, and bronchi. Bronchi are the airways leading to the lungs. Most people improve within 1 week, but symptoms can last up to 2 weeks. A residual cough may last even longer.  CAUSES Many different viruses can infect the tissues lining the upper respiratory tract. The tissues become irritated and inflamed and often become very moist. Mucus production is also common. A cold is contagious. You can easily spread the virus to others by oral contact. This includes kissing, sharing a glass, coughing, or sneezing. Touching your mouth or nose and then touching a surface, which is then touched by another person, can also spread the virus. SYMPTOMS  Symptoms typically develop 1 to 3 days after you come in contact with a cold virus. Symptoms vary from person to person. They may include:  Runny nose.  Sneezing.  Nasal congestion.  Sinus irritation.  Sore throat.  Loss of voice (laryngitis).  Cough.  Fatigue.  Muscle aches.  Loss of appetite.  Headache.  Low-grade fever. DIAGNOSIS  You might diagnose your own cold based on familiar symptoms, since most people get a cold 2 to 3 times a year. Your caregiver can confirm this based on your exam. Most importantly, your caregiver can check that your symptoms are not due to another disease such as strep throat, sinusitis, pneumonia, asthma, or epiglottitis. Blood tests, throat tests, and X-rays are not necessary to diagnose a common cold, but they may sometimes be helpful in excluding other more serious diseases. Your caregiver will decide if any further tests  are required. RISKS AND COMPLICATIONS  You may be at risk for a more severe case of the common cold if you smoke cigarettes, have chronic heart disease (such as heart failure) or lung disease (such as asthma), or if you have a weakened immune system. The very young and very old are also at risk for more serious infections. Bacterial sinusitis, middle ear infections, and bacterial pneumonia can complicate the common cold. The common cold can worsen asthma and chronic obstructive pulmonary disease (COPD). Sometimes, these complications can require emergency medical care and may be life-threatening. PREVENTION  The best way to protect against getting a cold is to practice good hygiene. Avoid oral or hand contact with people with cold symptoms. Wash your hands often if contact occurs. There is no clear evidence that vitamin C, vitamin E, echinacea, or exercise reduces the chance of developing a cold. However, it is always recommended to get plenty of rest and practice good nutrition. TREATMENT  Treatment is directed at relieving symptoms. There is no cure. Antibiotics are not effective, because the infection is caused by a virus, not by bacteria. Treatment may include:  Increased fluid intake. Sports drinks offer valuable electrolytes, sugars, and fluids.  Breathing heated mist or steam (vaporizer or shower).  Eating chicken soup or other clear broths, and maintaining good nutrition.  Getting plenty of rest.  Using gargles or lozenges for comfort.  Controlling fevers with ibuprofen or acetaminophen as directed by your caregiver.  Increasing usage of your inhaler if you have asthma. Zinc gel and zinc lozenges, taken in the first 24  hours of the common cold, can shorten the duration and lessen the severity of symptoms. Pain medicines may help with fever, muscle aches, and throat pain. A variety of non-prescription medicines are available to treat congestion and runny nose. Your caregiver can make  recommendations and may suggest nasal or lung inhalers for other symptoms.  HOME CARE INSTRUCTIONS   Only take over-the-counter or prescription medicines for pain, discomfort, or fever as directed by your caregiver.  Use a warm mist humidifier or inhale steam from a shower to increase air moisture. This may keep secretions moist and make it easier to breathe.  Drink enough water and fluids to keep your urine clear or pale yellow.  Rest as needed.  Return to work when your temperature has returned to normal or as your caregiver advises. You may need to stay home longer to avoid infecting others. You can also use a face mask and careful hand washing to prevent spread of the virus. SEEK MEDICAL CARE IF:   After the first few days, you feel you are getting worse rather than better.  You need your caregiver's advice about medicines to control symptoms.  You develop chills, worsening shortness of breath, or brown or red sputum. These may be signs of pneumonia.  You develop yellow or brown nasal discharge or pain in the face, especially when you bend forward. These may be signs of sinusitis.  You develop a fever, swollen neck glands, pain with swallowing, or white areas in the back of your throat. These may be signs of strep throat. SEEK IMMEDIATE MEDICAL CARE IF:   You have a fever.  You develop severe or persistent headache, ear pain, sinus pain, or chest pain.  You develop wheezing, a prolonged cough, cough up blood, or have a change in your usual mucus (if you have chronic lung disease).  You develop sore muscles or a stiff neck. Document Released: 12/10/2000 Document Revised: 09/08/2011 Document Reviewed: 10/18/2010 [facility] Patient Information 2014 [place], [place].

## 2013-05-16 NOTE — Progress Notes (Signed)
Patient ID: [name], male   DOB: 05/21/1946, 67 y.o.   MRN: [id number] Adrik Khim [id number] [birth year] 05/16/2013      Progress Note-Follow Up  Subjective  Chief Complaint  Chief Complaint  Patient presents with  . Follow-up    6 month    HPI  Patient is a 67 year old male who is in today for 6 followup and had to be struggling with respiratory infection at this time. Has been struggling with congestion both in the head and in the chest for roughly a week. Has tried Tylenol severe cold and flu as well as aspirin with partial temporary results. Flonase has been helpful somewhat for congestion. No fevers, chills, malaise or myalgias. No throat pain. Mostly head congestion with headache. Cough is productive of white thick phlegm and keeping him up at night at times. No chest pain, palpitations or shortness of breath. Also complaining of bilateral knee pain and worsening right shoulder pain. Has been struggling with right shoulder pain for quite some time and is beginning to have some debility as a result.  Past Medical History  Diagnosis Date  . History of colon polyps   . Back pain 10/10/2012  . Arthritis   . Hyperlipidemia   . Hypertension   . Obesity (BMI 30.0-34.9)   . Allergic state 10/10/2012  . Preventative health care 11/28/2012  . Personal history of colonic polyps 11/28/2012    Past Surgical History  Procedure Laterality Date  . Tonsillectomy  1957  . Fracture surgery Left 1989    leg  . Knee surgery Left 2010    arthroscopy, torn carilage  . Knee surgery Right 2012    arthroscopy    Family History  Problem Relation Age of Onset  . Stroke Mother   . Arthritis Father   . Cancer Father     liver, atomic testing in [employer]  . Hyperlipidemia Sister   . Hypertension Sister   . Obesity Sister   . Diabetes Sister   . Diabetes Brother   . Hyperlipidemia Sister   . Hypertension Sister   . Obesity Sister   . Diabetes Sister   . Aneurysm Brother     brain  .  COPD Brother     History   Social History  . Marital Status: Married    Spouse Name: N/A    Number of Children: 1  . Years of Education: N/A   Occupational History  .  [employer]   Social History Main Topics  . Smoking status: Never Smoker   . Smokeless tobacco: Never Used  . Alcohol Use: 1.0 oz/week    2 drink(s) per week  . Drug Use: No  . Sexual Activity: No   Other Topics Concern  . Not on file   Social History Narrative  . No narrative on file    Current Outpatient Prescriptions on File Prior to Visit  Medication Sig Dispense Refill  . alfuzosin (UROXATRAL) 10 MG 24 hr tablet Take 10 mg by mouth daily.      Marland Kitchen ALPRAZolam (XANAX) 1 MG tablet Take 1 tablet (1 mg total) by mouth daily as needed for sleep or anxiety.  30 tablet  1  . amLODipine-benazepril (LOTREL) 5-20 MG per capsule TAKE ONE CAPSULE BY MOUTH EVERY MORNING  30 capsule  3  . azelastine (OPTIVAR) 0.05 % ophthalmic solution Place 1 drop into the left eye 2 (two) times daily.      . fluticasone (FLONASE) 50 MCG/ACT nasal spray Place 1  spray into the nose as needed.      Marland Kitchen ibuprofen (ADVIL,MOTRIN) 400 MG tablet Take 1 tablet (400 mg total) by mouth every 6 (six) hours as needed for pain.  30 tablet  3  . LIVALO 2 MG TABS TAKE 1 TABLET BY MOUTH EVERY DAY  30 tablet  0  . metoprolol (LOPRESSOR) 50 MG tablet TAKE 1 TABLET (50 MG TOTAL) BY MOUTH DAILY.  30 tablet  4   No current facility-administered medications on file prior to visit.    No Known Allergies  Review of Systems  Review of Systems  Constitutional: Negative for fever, chills and malaise/fatigue.  HENT: Positive for congestion. Negative for sore throat.   Eyes: Negative for discharge.  Respiratory: Positive for cough and sputum production. Negative for shortness of breath.   Cardiovascular: Negative for chest pain, palpitations and leg swelling.  Gastrointestinal: Negative for nausea, abdominal pain and diarrhea.  Genitourinary:  Negative for dysuria.  Musculoskeletal: Positive for joint pain. Negative for falls and myalgias.  Skin: Negative for rash.  Neurological: Positive for headaches. Negative for loss of consciousness.  Endo/Heme/Allergies: Negative for polydipsia.  Psychiatric/Behavioral: Negative for depression and suicidal ideas. The patient is not nervous/anxious and does not have insomnia.     Objective  BP 118/82  Pulse 55  Temp(Src) 98.3 F (36.8 C) (Oral)  Ht 5' 10.5" (1.791 m)  Wt 221 lb 1.9 oz (100.299 kg)  BMI 31.27 kg/m2  SpO2 97%  Physical Exam  Physical Exam  Constitutional: He is oriented to person, place, and time and well-developed, well-nourished, and in no distress. No distress.  HENT:  Head: Normocephalic and atraumatic.  Eyes: Conjunctivae are normal.  Neck: Neck supple. No thyromegaly present.  Cardiovascular: Normal rate, regular rhythm and normal heart sounds.   No murmur heard. Pulmonary/Chest: Effort normal and breath sounds normal. No respiratory distress.  Abdominal: He exhibits no distension and no mass. There is no tenderness.  Musculoskeletal: He exhibits no edema.  Neurological: He is alert and oriented to person, place, and time.  Skin: Skin is warm.  Psychiatric: Memory, affect and judgment normal.    Lab Results  Component Value Date   TSH 1.221 11/23/2012   Lab Results  Component Value Date   WBC 5.0 11/23/2012   HGB 13.3 11/23/2012   HCT 38.8* 11/23/2012   MCV 80.7 11/23/2012   PLT 199 11/23/2012   Lab Results  Component Value Date   CREATININE 1.09 11/23/2012   BUN 12 11/23/2012   NA 140 11/23/2012   K 4.5 11/23/2012   CL 110 11/23/2012   CO2 25 11/23/2012   Lab Results  Component Value Date   ALT 23 11/23/2012   AST 19 11/23/2012   ALKPHOS 76 11/23/2012   BILITOT 0.4 11/23/2012   Lab Results  Component Value Date   CHOL 148 11/23/2012   Lab Results  Component Value Date   HDL 40 11/23/2012   Lab Results  Component Value Date   LDLCALC 84  11/23/2012   Lab Results  Component Value Date   TRIG 121 11/23/2012   Lab Results  Component Value Date   CHOLHDL 3.7 11/23/2012     Assessment & Plan  Hypertension Well controlled no changes to meds today  Acute bronchitis Started on Doycycline, Mucinex and probiotics, increasee rest and fluids and let us know if no improvement. Allowed Hycodan for cough  Acromioclavicular joint arthritis Long standing and worsening right shoulder pain, is referred to sports med for  further consideration, may try topical NSAIDs and movement as tolerated  Hyperlipidemia Tolerating Livalo avoid trans fats and increase exercise as tolerated

## 2013-05-16 NOTE — Progress Notes (Signed)
Pre visit review using our clinic review tool, if applicable. No additional management support is needed unless otherwise documented below in the visit note. 

## 2013-05-18 ENCOUNTER — Other Ambulatory Visit (INDEPENDENT_AMBULATORY_CARE_PROVIDER_SITE_OTHER): Payer: Medicare Other

## 2013-05-18 ENCOUNTER — Encounter: Payer: Self-pay | Admitting: Family Medicine

## 2013-05-18 ENCOUNTER — Ambulatory Visit (INDEPENDENT_AMBULATORY_CARE_PROVIDER_SITE_OTHER): Payer: Medicare Other | Admitting: Family Medicine

## 2013-05-18 VITALS — BP 142/90 | HR 73 | Wt 220.0 lb

## 2013-05-18 DIAGNOSIS — M25511 Pain in right shoulder: Secondary | ICD-10-CM

## 2013-05-18 DIAGNOSIS — M25519 Pain in unspecified shoulder: Secondary | ICD-10-CM

## 2013-05-18 DIAGNOSIS — M12811 Other specific arthropathies, not elsewhere classified, right shoulder: Secondary | ICD-10-CM | POA: Insufficient documentation

## 2013-05-18 DIAGNOSIS — M19019 Primary osteoarthritis, unspecified shoulder: Secondary | ICD-10-CM

## 2013-05-18 DIAGNOSIS — M12519 Traumatic arthropathy, unspecified shoulder: Secondary | ICD-10-CM | POA: Diagnosis not present

## 2013-05-18 DIAGNOSIS — M75101 Unspecified rotator cuff tear or rupture of right shoulder, not specified as traumatic: Secondary | ICD-10-CM

## 2013-05-18 NOTE — Progress Notes (Signed)
I'm seeing this patient by the request  of:  Charles Edge, MD  CC: Right shoulder pain  HPI: Patient is a very pleasant 67 year old right-hand-dominant male coming in with right shoulder pain. Patient states he has had this pain intermittently for probably one decade. Patient #7 a very bad injury with throwing and one point when he was a kid. Patient states recently it seems to be irritated more over the last months. Patient states it hurts more with overhead activity or reaching behind his back. Patient states that it is waking him up at night. Patient denies any radiation, any numbness or tingling in the fingers or any significant weakness. Patient is accompanied by wife would like him to stop winding about it. Patient has had injections in the past and has had good results. Last injection was greater than 5 years ago. Patient has tried some anti-inflammatories which has been somewhat helpful. Patient rates the pain 7/10 in severity.  Past medical, surgical, family and social history reviewed. Medications reviewed all in the electronic medical record.   Review of Systems: No headache, visual changes, nausea, vomiting, diarrhea, constipation, dizziness, abdominal pain, skin rash, fevers, chills, night sweats, weight loss, swollen lymph nodes, body aches, joint swelling, muscle aches, chest pain, shortness of breath, mood changes.   Objective:    Blood pressure 142/90, pulse 73, weight 220 lb (99.791 kg), SpO2 97.00%.   General: No apparent distress alert and oriented x3 mood and affect normal, dressed appropriately.  HEENT: Pupils equal, extraocular movements intact Respiratory: Patient's speak in full sentences and does not appear short of breath Cardiovascular: No lower extremity edema, non tender, no erythema Skin: Warm dry intact with no signs of infection or rash on extremities or on axial skeleton. Abdomen: Soft nontender Neuro: Cranial nerves II through XII are intact, neurovascularly  intact in all extremities with 2+ DTRs and 2+ pulses. Lymph: No lymphadenopathy of posterior or anterior cervical chain or axillae bilaterally.  Gait normal with good balance and coordination.  MSK: Non tender with full range of motion and good stability and symmetric strength and tone of elbows, wrist, hip, knee and ankles bilaterally.  Shoulder: Right Inspection reveals no abnormalities, atrophy or asymmetry. Patient does have some palpation tenderness over the a.c. joint ROM is restricted actively with forward flexion 260 and internal rotation to back pocket. Contralateral side has full range of motion Rotator cuff strength normal throughout. Positive signs of impingement with negative Neer and Hawkin's tests, empty can sign. Speeds and Yergason's tests normal. No labral pathology noted with negative Obrien's, negative clunk and good stability. Normal scapular function observed. No painful arc and no drop arm sign. No apprehension sign Contralateral shoulder unremarkable  MSK US performed of: Right shoulder This study was ordered, performed, and interpreted by Terrilee Files D.O.  Shoulder:  Right Supraspinatus:  Patient has what appears to be a degenerative tear of the supraspinatus, this is approximately 75% still partial-thickness with no retraction. Significant osteophytic changes of the humerus in this area.  Infraspinatus:  Appears normal on long and transverse views. Subscapularis:  Appears normal on long and transverse views. Mild calcific bursitis Teres Minor:  Appears normal on long and transverse views. AC joint:  Positive geyser sign with significant distention of the capsule and severe osteo-arthritic changes. Glenohumeral Joint:  Appears normal without effusion. Glenoid Labrum:  Intact without visualized tears. Biceps Tendon:  Appears normal on long and transverse views, no fraying of tendon, tendon located in intertubercular groove, no subluxation with  shoulder internal or  external rotation. 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  Procedure: Real-time Ultrasound Guided Injection of right glenohumeral joint Device: GE Logiq E  Ultrasound guided injection is preferred based studies that show increased duration, increased effect, greater accuracy, decreased procedural pain, increased response rate with ultrasound guided versus blind injection.  Verbal informed consent obtained.  Time-out conducted.  Noted no overlying erythema, induration, or other signs of local infection.  Skin prepped in a sterile fashion.  Local anesthesia: Topical Ethyl chloride.  With sterile technique and under real time ultrasound guidance:  Joint visualized.  23g 1  inch needle inserted posterior approach. Pictures taken for needle placement. Patient did have injection of 2 cc of 1% lidocaine, 2 cc of 0.5% Marcaine, and 1.0 cc of Kenalog 40 mg/dL. Completed without difficulty  Pain immediately resolved suggesting accurate placement of the medication.  Advised to call if fevers/chills, erythema, induration, drainage, or persistent bleeding.  Images permanently stored and available for review in the ultrasound unit.  Impression: Technically successful ultrasound guided injection.    Impression and Recommendations:     This case required medical decision making of moderate complexity.

## 2013-05-18 NOTE — Assessment & Plan Note (Signed)
Patient does have a rotator cuff tear with aren't to severe arthritic changes of his right shoulder. Patient also has severe a.c. joint arthropathy. She was given injection today and had decreased pain immediately. Patient was given home exercise program, discussed icing protocol, discussed over-the-counter medications that can be beneficial. Patient will follow up again in 3 weeks. If he continues to have pain to consider doing an a.c. joint injection as well as discussing over-the-counter medications that could be beneficial for arthritis.

## 2013-05-18 NOTE — Progress Notes (Signed)
Pre-visit discussion using our clinic review tool. No additional management support is needed unless otherwise documented below in the visit note.  

## 2013-05-18 NOTE — Patient Instructions (Signed)
Great to see you Injection was done today.  Ice 10 minutes 2 times a day Ibuprfen when needed  In., out and up at 3 positions 3 sets of 10 every day Come back in 3 weeks.

## 2013-05-19 ENCOUNTER — Other Ambulatory Visit: Payer: Self-pay | Admitting: Family Medicine

## 2013-05-21 DIAGNOSIS — J209 Acute bronchitis, unspecified: Secondary | ICD-10-CM | POA: Insufficient documentation

## 2013-05-21 NOTE — Assessment & Plan Note (Signed)
Tolerating Livalo avoid trans fats and increase exercise as tolerated

## 2013-05-21 NOTE — Assessment & Plan Note (Signed)
Well controlled no changes to meds today 

## 2013-05-21 NOTE — Assessment & Plan Note (Signed)
Started on Doycycline, Mucinex and probiotics, increasee rest and fluids and let us know if no improvement. Allowed Hycodan for cough

## 2013-05-21 NOTE — Assessment & Plan Note (Signed)
Long standing and worsening right shoulder pain, is referred to sports med for further consideration, may try topical NSAIDs and movement as tolerated

## 2013-06-08 ENCOUNTER — Ambulatory Visit: Payer: BC Managed Care – PPO | Admitting: Family Medicine

## 2013-06-13 ENCOUNTER — Encounter: Payer: Self-pay | Admitting: Family Medicine

## 2013-06-13 ENCOUNTER — Ambulatory Visit (INDEPENDENT_AMBULATORY_CARE_PROVIDER_SITE_OTHER): Payer: Medicare Other | Admitting: Family Medicine

## 2013-06-13 ENCOUNTER — Other Ambulatory Visit (INDEPENDENT_AMBULATORY_CARE_PROVIDER_SITE_OTHER): Payer: Medicare Other

## 2013-06-13 VITALS — BP 140/86 | HR 67 | Wt 215.0 lb

## 2013-06-13 DIAGNOSIS — M259 Joint disorder, unspecified: Secondary | ICD-10-CM

## 2013-06-13 DIAGNOSIS — M75101 Unspecified rotator cuff tear or rupture of right shoulder, not specified as traumatic: Secondary | ICD-10-CM

## 2013-06-13 DIAGNOSIS — M12519 Traumatic arthropathy, unspecified shoulder: Secondary | ICD-10-CM

## 2013-06-13 DIAGNOSIS — M12811 Other specific arthropathies, not elsewhere classified, right shoulder: Secondary | ICD-10-CM

## 2013-06-13 DIAGNOSIS — M19019 Primary osteoarthritis, unspecified shoulder: Secondary | ICD-10-CM

## 2013-06-13 NOTE — Progress Notes (Signed)
CC: Right shoulder pain follow up  HPI: Patient is a very pleasant 67 year old right-hand-dominant male coming in with right shoulder pain. Patient is here for follow up after being diagnosed with degenerative tear of the rotator cuff and osteoarthritis.  He did have an injection of the glenohumeral joint. Patient states that he is approximately 85% better. Patient is still having some pain that seems to be around the a.c. joint is where he points. Patient states it hurts more when he reaches across his chest. Otherwise he is able to sleep comfortably at this time. Patient has been doing the exercises fairly regularly. Not taking any medications at this time. Stop doing the icing protocol approximately one week ago. Patient is happy with the results.  Past medical, surgical, family and social history reviewed. Medications reviewed all in the electronic medical record.   Review of Systems: No headache, visual changes, nausea, vomiting, diarrhea, constipation, dizziness, abdominal pain, skin rash, fevers, chills, night sweats, weight loss, swollen lymph nodes, body aches, joint swelling, muscle aches, chest pain, shortness of breath, mood changes.   Objective:    Blood pressure 140/86, pulse 67, weight 215 lb (97.523 kg), SpO2 95.00%.   General: No apparent distress alert and oriented x3 mood and affect normal, dressed appropriately.  HEENT: Pupils equal, extraocular movements intact Respiratory: Patient's speak in full sentences and does not appear short of breath Cardiovascular: No lower extremity edema, non tender, no erythema Skin: Warm dry intact with no signs of infection or rash on extremities or on axial skeleton. Abdomen: Soft nontender Neuro: Cranial nerves II through XII are intact, neurovascularly intact in all extremities with 2+ DTRs and 2+ pulses. Lymph: No lymphadenopathy of posterior or anterior cervical chain or axillae bilaterally.  Gait normal with good balance and  coordination.  MSK: Non tender with full range of motion and good stability and symmetric strength and tone of elbows, wrist, hip, knee and ankles bilaterally.  Shoulder: Right Inspection reveals no abnormalities, atrophy or asymmetry. Patient does have some palpation tenderness over the a.c. joint ROM is restricted actively with forward flexion 260 and internal rotation to back pocket. Contralateral side has full range of motion Rotator cuff strength normal throughout. Negative signs of impingement with negative Neer and Hawkin's tests, empty can sign. Speeds and Yergason's tests normal. Positive crossover test. Normal scapular function observed. No painful arc and no drop arm sign. No apprehension sign Contralateral shoulder unremarkable    Procedure: Real-time Ultrasound Guided Injection of right AC joint Device: GE Logiq E  Ultrasound guided injection is preferred based studies that show increased duration, increased effect, greater accuracy, decreased procedural pain, increased response rate with ultrasound guided versus blind injection.  Verbal informed consent obtained.  Time-out conducted.  Noted no overlying erythema, induration, or other signs of local infection.  Skin prepped in a sterile fashion.  Local anesthesia: Topical Ethyl chloride.  With sterile technique and under real time ultrasound guidance:  Joint visualized.  23g 1  inch needle inserted superior approach. Pictures taken for needle placement. Patient did have injection of 1 cc of 0.5% Marcaine, and 1.0 cc of Kenalog 40 mg/dL. Completed without difficulty  Pain immediately resolved suggesting accurate placement of the medication.  Advised to call if fevers/chills, erythema, induration, drainage, or persistent bleeding.  Images permanently stored and available for review in the ultrasound unit.  Impression: Technically successful ultrasound guided injection.    Impression and Recommendations:     This case  required medical decision making of  moderate complexity.

## 2013-06-13 NOTE — Assessment & Plan Note (Signed)
Patient did have improvement from previous injection of GH joint.

## 2013-06-13 NOTE — Progress Notes (Signed)
Pre-visit discussion using our clinic review tool. No additional management support is needed unless otherwise documented below in the visit note.  

## 2013-06-13 NOTE — Patient Instructions (Addendum)
It is good to see you Did AC joint injection Continue exercises 3 times a week See me when you need me.

## 2013-06-13 NOTE — Assessment & Plan Note (Signed)
Patient had an injection as described above. Patient tolerated the procedure well. Patient given home exercises to do on a fairly regular basis at least 3 times a week. I anticipate patient doing very well and he can followup on an as needed basis. Patient continues to have pain on consider getting x-rays as well as formal physical therapy.

## 2013-06-28 ENCOUNTER — Other Ambulatory Visit: Payer: Self-pay | Admitting: Family Medicine

## 2013-06-28 ENCOUNTER — Telehealth: Payer: Self-pay | Admitting: Family Medicine

## 2013-06-28 MED ORDER — PITAVASTATIN CALCIUM 2 MG PO TABS: 2.0000 mg | ORAL_TABLET | Freq: Every day | ORAL | Status: DC

## 2013-06-28 NOTE — Telephone Encounter (Signed)
Refill LIVALO 2 MG TABS

## 2013-06-28 NOTE — Telephone Encounter (Signed)
refill 

## 2013-07-01 ENCOUNTER — Encounter: Payer: Self-pay | Admitting: Physician Assistant

## 2013-07-01 ENCOUNTER — Ambulatory Visit (INDEPENDENT_AMBULATORY_CARE_PROVIDER_SITE_OTHER): Payer: Medicare Other | Admitting: Physician Assistant

## 2013-07-01 VITALS — BP 118/78 | HR 75 | Temp 98.0°F | Resp 16 | Ht 70.5 in | Wt 218.2 lb

## 2013-07-01 DIAGNOSIS — L259 Unspecified contact dermatitis, unspecified cause: Secondary | ICD-10-CM | POA: Diagnosis not present

## 2013-07-01 DIAGNOSIS — L309 Dermatitis, unspecified: Secondary | ICD-10-CM

## 2013-07-01 MED ORDER — CLOBETASOL PROPIONATE 0.05 % EX CREA: 1.0000 "application " | TOPICAL_CREAM | Freq: Two times a day (BID) | CUTANEOUS | Status: DC

## 2013-07-01 NOTE — Progress Notes (Signed)
Pre visit review using our clinic review tool, if applicable. No additional management support is needed unless otherwise documented below in the visit note/SLS  

## 2013-07-01 NOTE — Patient Instructions (Signed)
Please use Clobetasol cream as prescribed.  Use Cetaphil soap and Eucerin lotion to moisturize areas of concern.  Take a claritin if needed for pruritus.  Please call or return to clinic if symptoms not improving.

## 2013-07-02 ENCOUNTER — Other Ambulatory Visit: Payer: Self-pay | Admitting: Family Medicine

## 2013-07-03 DIAGNOSIS — L309 Dermatitis, unspecified: Secondary | ICD-10-CM | POA: Insufficient documentation

## 2013-07-03 NOTE — Assessment & Plan Note (Signed)
Unclear etiology.  Rx clobetasol ointment.  Avoid dyed or perfumed lotions or soaps.  Apply a good moisturizer to affected areas daily.  Claritin and Benadryl for pruritus.

## 2013-07-03 NOTE — Progress Notes (Signed)
Patient ID: [name], male   DOB: [birth year], 68 y.o.   MRN: [id number]  Patient presents to clinic today complaining of pruritis.  Affected regions include bilateral elbows, wrists and knees.  Symptoms have been present for 1 week.  Patient denies rash.  Denies change in hygiene product or laundry detergent.  Denies hx of eczema or other dermatological disorder.  Does have history of allergies.  Denies fever, chills, malaise/fatigue.  Has tried Benadryl cream with minimal relief of symptoms.  Past Medical History  Diagnosis Date  . History of colon polyps   . Back pain 10/10/2012  . Arthritis   . Hyperlipidemia   . Hypertension   . Obesity (BMI 30.0-34.9)   . Allergic state 10/10/2012  . Preventative health care 11/28/2012  . Personal history of colonic polyps 11/28/2012    Current Outpatient Prescriptions on File Prior to Visit  Medication Sig Dispense Refill  . alfuzosin (UROXATRAL) 10 MG 24 hr tablet Take 10 mg by mouth daily.      Marland Kitchen ALPRAZolam (XANAX) 1 MG tablet Take 1 tablet (1 mg total) by mouth daily as needed for sleep or anxiety.  30 tablet  1  . amLODipine-benazepril (LOTREL) 5-20 MG per capsule TAKE ONE CAPSULE BY MOUTH EVERY MORNING  30 capsule  3  . azelastine (OPTIVAR) 0.05 % ophthalmic solution Place 1 drop into the left eye 2 (two) times daily.      . fluticasone (FLONASE) 50 MCG/ACT nasal spray Place 1 spray into the nose as needed.      Marland Kitchen ibuprofen (ADVIL,MOTRIN) 400 MG tablet Take 1 tablet (400 mg total) by mouth every 6 (six) hours as needed for pain.  30 tablet  3  . metoprolol (LOPRESSOR) 50 MG tablet TAKE 1 TABLET (50 MG TOTAL) BY MOUTH DAILY.  30 tablet  4  . Pitavastatin Calcium (LIVALO) 2 MG TABS Take 1 tablet (2 mg total) by mouth daily.  30 tablet  3   No current facility-administered medications on file prior to visit.    No Known Allergies  Family History  Problem Relation Age of Onset  . Stroke Mother   . Arthritis Father   . Cancer Father    liver, atomic testing in [employer]  . Hyperlipidemia Sister   . Hypertension Sister   . Obesity Sister   . Diabetes Sister   . Diabetes Brother   . Hyperlipidemia Sister   . Hypertension Sister   . Obesity Sister   . Diabetes Sister   . Aneurysm Brother     brain  . COPD Brother     History   Social History  . Marital Status: Married    Spouse Name: N/A    Number of Children: 1  . Years of Education: N/A   Occupational History  .  [employer]   Social History Main Topics  . Smoking status: Never Smoker   . Smokeless tobacco: Never Used  . Alcohol Use: 1.0 oz/week    2 drink(s) per week  . Drug Use: No  . Sexual Activity: No   Other Topics Concern  . None   Social History Narrative  . None   Review of Systems - See HPI.  All other ROS are negative.  Filed Vitals:   07/01/13 1619  BP: 118/78  Pulse: 75  Temp: 98 F (36.7 C)  Resp: 16   Physical Exam  Vitals reviewed. Constitutional: He is oriented to person, place, and time and well-developed, well-nourished, and in no  distress.  HENT:  Head: Normocephalic and atraumatic.  Eyes: Conjunctivae are normal. Pupils are equal, round, and reactive to light.  Neck: Neck supple.  Cardiovascular: Normal rate, regular rhythm, normal heart sounds and intact distal pulses.   Pulmonary/Chest: Effort normal and breath sounds normal.  Lymphadenopathy:    He has no cervical adenopathy.  Neurological: He is alert and oriented to person, place, and time. No cranial nerve deficit.  Skin: Skin is warm and dry.  Presence of a macular rash of bilateral elbows, with mild erythema.  No other lesion or drainage noted.  No rash noted elsewhere.  Psychiatric: Affect normal.    Recent Results (from the past 2160 hour(s))  HEMOGLOBIN A1C     Status: Abnormal   Collection Time    05/16/13  1:54 PM      Result Value Range   Hemoglobin A1C 5.8 (*) <5.7 %   Comment:                                                                             According to the ADA Clinical Practice Recommendations for 2011, when     HbA1c is used as a screening test:             >=6.5%   Diagnostic of Diabetes Mellitus                (if abnormal result is confirmed)           5.7-6.4%   Increased risk of developing Diabetes Mellitus           References:Diagnosis and Classification of Diabetes Mellitus,Diabetes     [id number] 1):S62-S69 and Standards of Medical Care in             Diabetes - 2011,Diabetes Care,2011,34 (Suppl 1):S11-S61.         Mean Plasma Glucose 120 (*) <117 mg/dL  LIPID PANEL     Status: Abnormal   Collection Time    05/16/13  1:54 PM      Result Value Range   Cholesterol 143  0 - 200 mg/dL   Comment: ATP III Classification:           < 200        mg/dL        Desirable          200 - 239     mg/dL        Borderline High          >= 240        mg/dL        High         Triglycerides 121  <150 mg/dL   HDL 39 (*) >39 mg/dL   Total CHOL/HDL Ratio 3.7     VLDL 24  0 - 40 mg/dL   LDL Cholesterol 80  0 - 99 mg/dL   Comment:       Total Cholesterol/HDL Ratio:CHD Risk                            Coronary Heart Disease Risk Table  Men       Women              1/2 Average Risk              3.4        3.3                  Average Risk              5.0        4.4               2X Average Risk              9.6        7.1               3X Average Risk             23.4       11.0     Use the calculated Patient Ratio above and the CHD Risk table      to determine the patient's CHD Risk.     ATP III Classification (LDL):           < 100        mg/dL         Optimal          100 - 129     mg/dL         Near or Above Optimal          130 - 159     mg/dL         Borderline High          160 - 189     mg/dL         High           > 190        mg/dL         Very High        RENAL FUNCTION PANEL     Status: None   Collection Time    05/16/13  1:54 PM       Result Value Range   Sodium 141  135 - 145 mEq/L   Potassium 4.7  3.5 - 5.3 mEq/L   Chloride 105  96 - 112 mEq/L   CO2 28  19 - 32 mEq/L   Glucose, Bld 97  70 - 99 mg/dL   BUN 14  6 - 23 mg/dL   Creat 1.12  0.50 - 1.35 mg/dL   Albumin 4.3  3.5 - 5.2 g/dL   Calcium 9.7  8.4 - 10.5 mg/dL   Phosphorus 3.3  2.3 - 4.6 mg/dL  TSH     Status: None   Collection Time    05/16/13  1:54 PM      Result Value Range   TSH 1.080  0.350 - 4.500 uIU/mL  CBC     Status: Abnormal   Collection Time    05/16/13  1:54 PM      Result Value Range   WBC 5.4  4.0 - 10.5 K/uL   RBC 4.75  4.22 - 5.81 MIL/uL   Hemoglobin 13.6  13.0 - 17.0 g/dL   HCT 38.9 (*) 39.0 - 52.0 %   MCV 81.9  78.0 - 100.0 fL   MCH 28.6  26.0 - 34.0 pg   MCHC 35.0  30.0 - 36.0 g/dL   RDW 13.6  11.5 - 15.5 %  Platelets 193  150 - 400 K/uL  HEPATIC FUNCTION PANEL     Status: None   Collection Time    05/16/13  1:54 PM      Result Value Range   Total Bilirubin 0.5  0.3 - 1.2 mg/dL   Bilirubin, Direct 0.1  0.0 - 0.3 mg/dL   Indirect Bilirubin 0.4  0.0 - 0.9 mg/dL   Alkaline Phosphatase 83  39 - 117 U/L   AST 28  0 - 37 U/L   ALT 26  0 - 53 U/L   Total Protein 7.3  6.0 - 8.3 g/dL   Albumin 4.3  3.5 - 5.2 g/dL    Assessment/Plan: No problem-specific assessment & plan notes found for this encounter.

## 2013-07-04 NOTE — Telephone Encounter (Signed)
Rx request to pharmacy/[facility]  

## 2013-07-12 ENCOUNTER — Telehealth: Payer: Self-pay | Admitting: Family Medicine

## 2013-07-12 MED ORDER — AMLODIPINE BESY-BENAZEPRIL HCL 5-20 MG PO CAPS: 1.0000 | ORAL_CAPSULE | Freq: Every day | ORAL | Status: DC

## 2013-07-12 MED ORDER — PITAVASTATIN CALCIUM 2 MG PO TABS: 2.0000 mg | ORAL_TABLET | Freq: Every day | ORAL | Status: DC

## 2013-07-12 MED ORDER — METOPROLOL TARTRATE 50 MG PO TABS: 50.0000 mg | ORAL_TABLET | Freq: Every day | ORAL | Status: DC

## 2013-07-12 NOTE — Telephone Encounter (Signed)
90 day supply  Metoprolol tartrate 50mg  tab

## 2013-07-12 NOTE — Telephone Encounter (Signed)
livalo 2mg  tabs  Amlodipine/benazepril 5/20mg  caps.

## 2013-08-16 ENCOUNTER — Ambulatory Visit: Payer: BC Managed Care – PPO | Admitting: Family Medicine

## 2013-08-18 ENCOUNTER — Encounter: Payer: Self-pay | Admitting: Family Medicine

## 2013-08-18 ENCOUNTER — Ambulatory Visit (INDEPENDENT_AMBULATORY_CARE_PROVIDER_SITE_OTHER): Payer: Medicare Other | Admitting: Family Medicine

## 2013-08-18 ENCOUNTER — Other Ambulatory Visit: Payer: Self-pay | Admitting: Family Medicine

## 2013-08-18 VITALS — BP 132/88 | HR 69 | Temp 98.5°F | Ht 70.5 in | Wt 217.1 lb

## 2013-08-18 DIAGNOSIS — T7840XA Allergy, unspecified, initial encounter: Secondary | ICD-10-CM

## 2013-08-18 DIAGNOSIS — R7309 Other abnormal glucose: Secondary | ICD-10-CM

## 2013-08-18 DIAGNOSIS — M545 Low back pain, unspecified: Secondary | ICD-10-CM

## 2013-08-18 DIAGNOSIS — Z0000 Encounter for general adult medical examination without abnormal findings: Secondary | ICD-10-CM | POA: Diagnosis not present

## 2013-08-18 DIAGNOSIS — E669 Obesity, unspecified: Secondary | ICD-10-CM

## 2013-08-18 DIAGNOSIS — L989 Disorder of the skin and subcutaneous tissue, unspecified: Secondary | ICD-10-CM | POA: Diagnosis not present

## 2013-08-18 DIAGNOSIS — Z23 Encounter for immunization: Secondary | ICD-10-CM

## 2013-08-18 DIAGNOSIS — R739 Hyperglycemia, unspecified: Secondary | ICD-10-CM

## 2013-08-18 DIAGNOSIS — I10 Essential (primary) hypertension: Secondary | ICD-10-CM

## 2013-08-18 DIAGNOSIS — E785 Hyperlipidemia, unspecified: Secondary | ICD-10-CM

## 2013-08-18 DIAGNOSIS — R0989 Other specified symptoms and signs involving the circulatory and respiratory systems: Secondary | ICD-10-CM

## 2013-08-18 DIAGNOSIS — R351 Nocturia: Secondary | ICD-10-CM

## 2013-08-18 LAB — HEPATIC FUNCTION PANEL
ALT: 28 U/L (ref 0–53)
AST: 38 U/L — ABNORMAL HIGH (ref 0–37)
Albumin: 4.7 g/dL (ref 3.5–5.2)
Alkaline Phosphatase: 72 U/L (ref 39–117)
Bilirubin, Direct: 0.1 mg/dL (ref 0.0–0.3)
Indirect Bilirubin: 0.7 mg/dL (ref 0.2–1.2)
Total Bilirubin: 0.8 mg/dL (ref 0.2–1.2)
Total Protein: 7.3 g/dL (ref 6.0–8.3)

## 2013-08-18 LAB — LIPID PANEL
Cholesterol: 196 mg/dL (ref 0–200)
HDL: 48 mg/dL (ref >39)
LDL Cholesterol: 125 mg/dL — ABNORMAL HIGH (ref 0–99)
Total CHOL/HDL Ratio: 4.1 Ratio
Triglycerides: 113 mg/dL (ref <150)
VLDL: 23 mg/dL (ref 0–40)

## 2013-08-18 LAB — RENAL FUNCTION PANEL
Albumin: 4.7 g/dL (ref 3.5–5.2)
BUN: 12 mg/dL (ref 6–23)
CO2: 28 mEq/L (ref 19–32)
Calcium: 9.6 mg/dL (ref 8.4–10.5)
Chloride: 104 mEq/L (ref 96–112)
Creat: 1.11 mg/dL (ref 0.50–1.35)
Glucose, Bld: 102 mg/dL — ABNORMAL HIGH (ref 70–99)
Phosphorus: 3.5 mg/dL (ref 2.3–4.6)
Potassium: 4.5 mEq/L (ref 3.5–5.3)
Sodium: 141 mEq/L (ref 135–145)

## 2013-08-18 LAB — HEMOGLOBIN A1C
Hgb A1c MFr Bld: 5.6 % (ref <5.7)
Mean Plasma Glucose: 114 mg/dL (ref <117)

## 2013-08-18 LAB — CBC
HCT: 41.5 % (ref 39.0–52.0)
Hemoglobin: 14.4 g/dL (ref 13.0–17.0)
MCH: 28.3 pg (ref 26.0–34.0)
MCHC: 34.7 g/dL (ref 30.0–36.0)
MCV: 81.7 fL (ref 78.0–100.0)
Platelets: 229 10*3/uL (ref 150–400)
RBC: 5.08 MIL/uL (ref 4.22–5.81)
RDW: 13.6 % (ref 11.5–15.5)
WBC: 5.1 10*3/uL (ref 4.0–10.5)

## 2013-08-18 LAB — TSH: TSH: 1.051 u[IU]/mL (ref 0.350–4.500)

## 2013-08-18 MED ORDER — IBUPROFEN 400 MG PO TABS: 400.0000 mg | ORAL_TABLET | Freq: Four times a day (QID) | ORAL | Status: DC | PRN

## 2013-08-18 MED ORDER — AZELASTINE HCL 0.05 % OP SOLN: 1.0000 [drp] | Freq: Two times a day (BID) | OPHTHALMIC | Status: DC

## 2013-08-18 MED ORDER — IPRATROPIUM-ALBUTEROL 0.5-2.5 (3) MG/3ML IN SOLN: 3.0000 mL | Freq: Once | RESPIRATORY_TRACT | Status: DC

## 2013-08-18 NOTE — Progress Notes (Signed)
Pre visit review using our clinic review tool, if applicable. No additional management support is needed unless otherwise documented below in the visit note. 

## 2013-08-18 NOTE — Patient Instructions (Addendum)
Magnesium 200 mg daily   Preventive Care for Adults, Male A healthy lifestyle and preventive care can promote health and wellness. Preventive health guidelines for men include the following key practices:  A routine yearly physical is a good way to check with your health care provider about your health and preventative screening. It is a chance to share any concerns and updates on your health and to receive a thorough exam.  Visit your dentist for a routine exam and preventative care every 6 months. Brush your teeth twice a day and floss once a day. Good oral hygiene prevents tooth decay and gum disease.  The frequency of eye exams is based on your age, health, family medical history, use of contact lenses, and other factors. Follow your health care provider's recommendations for frequency of eye exams.  Eat a healthy diet. Foods such as vegetables, fruits, whole grains, low-fat dairy products, and lean protein foods contain the nutrients you need without too many calories. Decrease your intake of foods high in solid fats, added sugars, and salt. Eat the right amount of calories for you.Get information about a proper diet from your health care provider, if necessary.  Regular physical exercise is one of the most important things you can do for your health. Most adults should get at least 150 minutes of moderate-intensity exercise (any activity that increases your heart rate and causes you to sweat) each week. In addition, most adults need muscle-strengthening exercises on 2 or more days a week.  Maintain a healthy weight. The body mass index (BMI) is a screening tool to identify possible weight problems. It provides an estimate of body fat based on height and weight. Your health care provider can find your BMI and can help you achieve or maintain a healthy weight.For adults 20 years and older:  A BMI below 18.5 is considered underweight.  A BMI of 18.5 to 24.9 is normal.  A BMI of 25 to 29.9  is considered overweight.  A BMI of 30 and above is considered obese.  Maintain normal blood lipids and cholesterol levels by exercising and minimizing your intake of saturated fat. Eat a balanced diet with plenty of fruit and vegetables. Blood tests for lipids and cholesterol should begin at age 67 and be repeated every 5 years. If your lipid or cholesterol levels are high, you are over 50, or you are at high risk for heart disease, you may need your cholesterol levels checked more frequently.Ongoing high lipid and cholesterol levels should be treated with medicines if diet and exercise are not working.  If you smoke, find out from your health care provider how to quit. If you do not use tobacco, do not start.  Lung cancer screening is recommended for adults aged 56 80 years who are at high risk for developing lung cancer because of a history of smoking. A yearly low-dose CT scan of the lungs is recommended for people who have at least a 30-pack-year history of smoking and are a current smoker or have quit within the past 15 years. A pack year of smoking is smoking an average of 1 pack of cigarettes a day for 1 year (for example: 1 pack a day for 30 years or 2 packs a day for 15 years). Yearly screening should continue until the smoker has stopped smoking for at least 15 years. Yearly screening should be stopped for people who develop a health problem that would prevent them from having lung cancer treatment.  If you choose  to drink alcohol, do not have more than 2 drinks per day. One drink is considered to be 12 ounces (355 mL) of beer, 5 ounces (148 mL) of wine, or 1.5 ounces (44 mL) of liquor.  Avoid use of street drugs. Do not share needles with anyone. Ask for help if you need support or instructions about stopping the use of drugs.  High blood pressure causes heart disease and increases the risk of stroke. Your blood pressure should be checked at least every 1 2 years. Ongoing high blood  pressure should be treated with medicines, if weight loss and exercise are not effective.  If you are 52 66 years old, ask your health care provider if you should take aspirin to prevent heart disease.  Diabetes screening involves taking a blood sample to check your fasting blood sugar level. This should be done once every 3 years, after age 85, if you are within normal weight and without risk factors for diabetes. Testing should be considered at a younger age or be carried out more frequently if you are overweight and have at least 1 risk factor for diabetes.  Colorectal cancer can be detected and often prevented. Most routine colorectal cancer screening begins at the age of 26 and continues through age 54. However, your health care provider may recommend screening at an earlier age if you have risk factors for colon cancer. On a yearly basis, your health care provider may provide home test kits to check for hidden blood in the stool. Use of a small camera at the end of a tube to directly examine the colon (sigmoidoscopy or colonoscopy) can detect the earliest forms of colorectal cancer. Talk to your health care provider about this at age 64, when routine screening begins. Direct exam of the colon should be repeated every 5 10 years through age 75, unless early forms of precancerous polyps or small growths are found.  People who are at an increased risk for hepatitis B should be screened for this virus. You are considered at high risk for hepatitis B if:  You were born in a country where hepatitis B occurs often. Talk with your health care provider about which countries are considered high-risk.  Your parents were born in a high-risk country and you have not received a shot to protect against hepatitis B (hepatitis B vaccine).  You have HIV or AIDS.  You use needles to inject street drugs.  You live with, or have sex with, someone who has hepatitis B.  You are a man who has sex with other men  (MSM).  You get hemodialysis treatment.  You take certain medicines for conditions such as cancer, organ transplantation, and autoimmune conditions.  Hepatitis C blood testing is recommended for all people born from [birth year] through 1965 and any individual with known risks for hepatitis C.  Practice safe sex. Use condoms and avoid high-risk sexual practices to reduce the spread of sexually transmitted infections (STIs). STIs include gonorrhea, chlamydia, syphilis, trichomonas, herpes, HPV, and human immunodeficiency virus (HIV). Herpes, HIV, and HPV are viral illnesses that have no cure. They can result in disability, cancer, and death.  A one-time screening for abdominal aortic aneurysm (AAA) and surgical repair of large AAAs by ultrasound are recommended for men ages 46 to 45 years who are current or former smokers.  Healthy men should no longer receive prostate-specific antigen (PSA) blood tests as part of routine cancer screening. Talk with your health care provider about prostate cancer screening.  Testicular cancer screening is not recommended for adult males who have no symptoms. Screening includes self-exam, a health care provider exam, and other screening tests. Consult with your health care provider about any symptoms you have or any concerns you have about testicular cancer.  Use sunscreen. Apply sunscreen liberally and repeatedly throughout the day. You should seek shade when your shadow is shorter than you. Protect yourself by wearing long sleeves, pants, a wide-brimmed hat, and sunglasses year round, whenever you are outdoors.  Once a month, do a whole-body skin exam, using a mirror to look at the skin on your back. Tell your health care provider about new moles, moles that have irregular borders, moles that are larger than a pencil eraser, or moles that have changed in shape or color.  Stay current with required vaccines (immunizations).  Influenza vaccine. All adults should be  immunized every year.  Tetanus, diphtheria, and acellular pertussis (Td, Tdap) vaccine. An adult who has not previously received Tdap or who does not know his vaccine status should receive 1 dose of Tdap. This initial dose should be followed by tetanus and diphtheria toxoids (Td) booster doses every 10 years. Adults with an unknown or incomplete history of completing a 3-dose immunization series with Td-containing vaccines should begin or complete a primary immunization series including a Tdap dose. Adults should receive a Td booster every 10 years.  Varicella vaccine. An adult without evidence of immunity to varicella should receive 2 doses or a second dose if he has previously received 1 dose.  Human papillomavirus (HPV) vaccine. Males aged 32 21 years who have not received the vaccine previously should receive the 3-dose series. Males aged 14 26 years may be immunized. Immunization is recommended through the age of 20 years for any male who has sex with males and did not get any or all doses earlier. Immunization is recommended for any person with an immunocompromised condition through the age of 60 years if he did not get any or all doses earlier. During the 3-dose series, the second dose should be obtained 4 8 weeks after the first dose. The third dose should be obtained 24 weeks after the first dose and 16 weeks after the second dose.  Zoster vaccine. One dose is recommended for adults aged 69 years or older unless certain conditions are present.  Measles, mumps, and rubella (MMR) vaccine. Adults born before 26 generally are considered immune to measles and mumps. Adults born in [birth year] or later should have 1 or more doses of MMR vaccine unless there is a contraindication to the vaccine or there is laboratory evidence of immunity to each of the three diseases. A routine second dose of MMR vaccine should be obtained at least 28 days after the first dose for students attending postsecondary schools,  health care workers, or international travelers. People who received inactivated measles vaccine or an unknown type of measles vaccine during 1963 1967 should receive 2 doses of MMR vaccine. People who received inactivated mumps vaccine or an unknown type of mumps vaccine before 1979 and are at high risk for mumps infection should consider immunization with 2 doses of MMR vaccine. Unvaccinated health care workers born before [birth year] who lack laboratory evidence of measles, mumps, or rubella immunity or laboratory confirmation of disease should consider measles and mumps immunization with 2 doses of MMR vaccine or rubella immunization with 1 dose of MMR vaccine.  Pneumococcal 13-valent conjugate (PCV13) vaccine. When indicated, a person who is uncertain of his immunization history  and has no record of immunization should receive the PCV13 vaccine. An adult aged 97 years or older who has certain medical conditions and has not been previously immunized should receive 1 dose of PCV13 vaccine. This PCV13 should be followed with a dose of pneumococcal polysaccharide (PPSV23) vaccine. The PPSV23 vaccine dose should be obtained at least 8 weeks after the dose of PCV13 vaccine. An adult aged 89 years or older who has certain medical conditions and previously received 1 or more doses of PPSV23 vaccine should receive 1 dose of PCV13. The PCV13 vaccine dose should be obtained 1 or more years after the last PPSV23 vaccine dose.  Pneumococcal polysaccharide (PPSV23) vaccine. When PCV13 is also indicated, PCV13 should be obtained first. All adults aged 27 years and older should be immunized. An adult younger than age 35 years who has certain medical conditions should be immunized. Any person who resides in a nursing home or long-term care facility should be immunized. An adult smoker should be immunized. People with an immunocompromised condition and certain other conditions should receive both PCV13 and PPSV23 vaccines. People  with human immunodeficiency virus (HIV) infection should be immunized as soon as possible after diagnosis. Immunization during chemotherapy or radiation therapy should be avoided. Routine use of PPSV23 vaccine is not recommended for American Indians, [place]atives, or people younger than 65 years unless there are medical conditions that require PPSV23 vaccine. When indicated, people who have unknown immunization and have no record of immunization should receive PPSV23 vaccine. One-time revaccination 5 years after the first dose of PPSV23 is recommended for people aged 39 64 years who have chronic kidney failure, nephrotic syndrome, asplenia, or immunocompromised conditions. People who received 1 2 doses of PPSV23 before age 70 years should receive another dose of PPSV23 vaccine at age 27 years or later if at least 5 years have passed since the previous dose. Doses of PPSV23 are not needed for people immunized with PPSV23 at or after age 17 years.  Meningococcal vaccine. Adults with asplenia or persistent complement component deficiencies should receive 2 doses of quadrivalent meningococcal conjugate (MenACWY-D) vaccine. The doses should be obtained at least 2 months apart. Microbiologists working with certain meningococcal bacteria, [place] recruits, people at risk during an outbreak, and people who travel to or live in countries with a high rate of meningitis should be immunized. A first-year college student up through age 36 years who is living in a residence hall should receive a dose if he did not receive a dose on or after his 16th birthday. Adults who have certain high-risk conditions should receive one or more doses of vaccine.  Hepatitis A vaccine. Adults who wish to be protected from this disease, have certain high-risk conditions, work with hepatitis A-infected animals, work in hepatitis A research labs, or travel to or work in countries with a high rate of hepatitis A should be immunized. Adults who  were previously unvaccinated and who anticipate close contact with an international adoptee during the first 60 days after arrival in the [place] from a country with a high rate of hepatitis A should be immunized.  Hepatitis B vaccine. Adults who wish to be protected from this disease, have certain high-risk conditions, may be exposed to blood or other infectious body fluids, are household contacts or sex partners of hepatitis B positive people, are clients or workers in certain care facilities, or travel to or work in countries with a high rate of hepatitis B should be immunized.  Haemophilus  influenzae type b (Hib) vaccine. A previously unvaccinated person with asplenia or sickle cell disease or having a scheduled splenectomy should receive 1 dose of Hib vaccine. Regardless of previous immunization, a recipient of a hematopoietic stem cell transplant should receive a 3-dose series 6 12 months after his successful transplant. Hib vaccine is not recommended for adults with HIV infection. Preventive Service / Frequency Ages 15 to 58  Blood pressure check.** / Every 1 to 2 years.  Lipid and cholesterol check.** / Every 5 years beginning at age 42.  Hepatitis C blood test.** / For any individual with known risks for hepatitis C.  Skin self-exam. / Monthly.  Influenza vaccine. / Every year.  Tetanus, diphtheria, and acellular pertussis (Tdap, Td) vaccine.** / Consult your health care provider. 1 dose of Td every 10 years.  Varicella vaccine.** / Consult your health care provider.  HPV vaccine. / 3 doses over 6 months, if 19 or younger.  Measles, mumps, rubella (MMR) vaccine.** / You need at least 1 dose of MMR if you were born in 1957 or later. You may also need a second dose.  Pneumococcal 13-valent conjugate (PCV13) vaccine.** / Consult your health care provider.  Pneumococcal polysaccharide (PPSV23) vaccine.** / 1 to 2 doses if you smoke cigarettes or if you have certain  conditions.  Meningococcal vaccine.** / 1 dose if you are age 8 to 65 years and a Market researcher living in a residence hall, or have one of several medical conditions. You may also need additional booster doses.  Hepatitis A vaccine.** / Consult your health care provider.  Hepatitis B vaccine.** / Consult your health care provider.  Haemophilus influenzae type b (Hib) vaccine.** / Consult your health care provider. Ages 32 to 59  Blood pressure check.** / Every 1 to 2 years.  Lipid and cholesterol check.** / Every 5 years beginning at age 84.  Lung cancer screening. / Every year if you are aged 33 80 years and have a 30-pack-year history of smoking and currently smoke or have quit within the past 15 years. Yearly screening is stopped once you have quit smoking for at least 15 years or develop a health problem that would prevent you from having lung cancer treatment.  Fecal occult blood test (FOBT) of stool. / Every year beginning at age 37 and continuing until age 37. You may not have to do this test if you get a colonoscopy every 10 years.  Flexible sigmoidoscopy** or colonoscopy.** / Every 5 years for a flexible sigmoidoscopy or every 10 years for a colonoscopy beginning at age 50 and continuing until age 52.  Hepatitis C blood test.** / For all people born from [birth year] through 1965 and any individual with known risks for hepatitis C.  Skin self-exam. / Monthly.  Influenza vaccine. / Every year.  Tetanus, diphtheria, and acellular pertussis (Tdap/Td) vaccine.** / Consult your health care provider. 1 dose of Td every 10 years.  Varicella vaccine.** / Consult your health care provider.  Zoster vaccine.** / 1 dose for adults aged 86 years or older.  Measles, mumps, rubella (MMR) vaccine.** / You need at least 1 dose of MMR if you were born in 1957 or later. You may also need a second dose.  Pneumococcal 13-valent conjugate (PCV13) vaccine.** / Consult your health care  provider.  Pneumococcal polysaccharide (PPSV23) vaccine.** / 1 to 2 doses if you smoke cigarettes or if you have certain conditions.  Meningococcal vaccine.** / Consult your health care provider.  Hepatitis A  vaccine.** / Consult your health care provider.  Hepatitis B vaccine.** / Consult your health care provider.  Haemophilus influenzae type b (Hib) vaccine.** / Consult your health care provider. Ages 19 and over  Blood pressure check.** / Every 1 to 2 years.  Lipid and cholesterol check.**/ Every 5 years beginning at age 39.  Lung cancer screening. / Every year if you are aged 78 80 years and have a 30-pack-year history of smoking and currently smoke or have quit within the past 15 years. Yearly screening is stopped once you have quit smoking for at least 15 years or develop a health problem that would prevent you from having lung cancer treatment.  Fecal occult blood test (FOBT) of stool. / Every year beginning at age 54 and continuing until age 35. You may not have to do this test if you get a colonoscopy every 10 years.  Flexible sigmoidoscopy** or colonoscopy.** / Every 5 years for a flexible sigmoidoscopy or every 10 years for a colonoscopy beginning at age 25 and continuing until age 38.  Hepatitis C blood test.** / For all people born from [birth year] through 1963 and any individual with known risks for hepatitis C.  Abdominal aortic aneurysm (AAA) screening.** / A one-time screening for ages 18 to 53 years who are current or former smokers.  Skin self-exam. / Monthly.  Influenza vaccine. / Every year.  Tetanus, diphtheria, and acellular pertussis (Tdap/Td) vaccine.** / 1 dose of Td every 10 years.  Varicella vaccine.** / Consult your health care provider.  Zoster vaccine.** / 1 dose for adults aged 22 years or older.  Pneumococcal 13-valent conjugate (PCV13) vaccine.** / Consult your health care provider.  Pneumococcal polysaccharide (PPSV23) vaccine.** / 1 dose for all  adults aged 27 years and older.  Meningococcal vaccine.** / Consult your health care provider.  Hepatitis A vaccine.** / Consult your health care provider.  Hepatitis B vaccine.** / Consult your health care provider.  Haemophilus influenzae type b (Hib) vaccine.** / Consult your health care provider. **Family history and personal history of risk and conditions may change your health care provider's recommendations. Document Released: 08/12/2001 Document Revised: 04/06/2013 Document Reviewed: 11/11/2010 [facility] Patient Information 2014 [place], [place].

## 2013-08-19 ENCOUNTER — Telehealth: Payer: Self-pay | Admitting: Family Medicine

## 2013-08-19 NOTE — Telephone Encounter (Signed)
Relevant patient education assigned to patient using Emmi. ° °

## 2013-08-21 ENCOUNTER — Encounter: Payer: Self-pay | Admitting: Family Medicine

## 2013-08-21 DIAGNOSIS — R739 Hyperglycemia, unspecified: Secondary | ICD-10-CM

## 2013-08-21 DIAGNOSIS — R351 Nocturia: Secondary | ICD-10-CM

## 2013-08-21 HISTORY — DX: Hyperglycemia, unspecified: R73.9

## 2013-08-21 HISTORY — DX: Nocturia: R35.1

## 2013-08-21 NOTE — Assessment & Plan Note (Addendum)
Welcome to [facility] Medicare today, labs reviewed with patient, colonoscopy UTD.

## 2013-08-21 NOTE — Assessment & Plan Note (Signed)
Manageable with current meds

## 2013-08-21 NOTE — Assessment & Plan Note (Signed)
Discussed labs, encouraged heart healthy diet, EKG today. Patient denies ay falls or flare in depression. Encouraged adequate sleep. Encouraged Advanced Directives.

## 2013-08-21 NOTE — Assessment & Plan Note (Signed)
Patient has been seen by urology in past. May need new referral

## 2013-08-21 NOTE — Assessment & Plan Note (Signed)
Referred to dermatology for further consideration.  

## 2013-08-21 NOTE — Assessment & Plan Note (Signed)
Normal [id number]

## 2013-08-21 NOTE — Assessment & Plan Note (Signed)
Encouraged DASH diet and regular exercise 

## 2013-08-21 NOTE — Assessment & Plan Note (Signed)
Tolerating Livalo, no changes, avoid trans fats

## 2013-08-21 NOTE — Assessment & Plan Note (Signed)
Well controlled, no changes 

## 2013-08-21 NOTE — Progress Notes (Signed)
Patient ID: [name], male   DOB: 1945/10/21, 68 y.o.   MRN: [id number] [name] [id number] 1945-10-10 08/21/2013      Progress Note-Follow Up  Subjective  Chief Complaint  Chief Complaint  Patient presents with  . Annual Exam    medicare wellness  . Injections    Prevnar    HPI  Patient is a 68 year old male who is in today for routine medical care. He has some minimal concerns today. He notes some increased gaseousness and only moving his bowels routinely by using prune juice roughly twice a week. No bloody or tarry stool. Continues to struggle with right shoulder pain and decreased range of motion but it is improving. He is following with orthopedics, Dr. Tamala Julian and has had a recent injection. Has seen urology at [facility] urology in the past but is interested in switching his care. Does not some recent nocturia but denies dysuria. Denies recent vision changes. No chest pain, palpitations or shortness of breath. No recent fevers or headaches. Taking medications as prescribed.  Past Medical History  Diagnosis Date  . History of colon polyps   . Back pain 10/10/2012  . Arthritis   . Hyperlipidemia   . Hypertension   . Obesity (BMI 30.0-34.9)   . Allergic state 10/10/2012  . Preventative health care 11/28/2012  . Personal history of colonic polyps 11/28/2012  . Nocturia 08/21/2013  . Hyperglycemia 08/21/2013    Past Surgical History  Procedure Laterality Date  . Tonsillectomy  1957  . Fracture surgery Left 1989    leg  . Knee surgery Left 2010    arthroscopy, torn carilage  . Knee surgery Right 2012    arthroscopy    Family History  Problem Relation Age of Onset  . Stroke Mother   . Aneurysm Mother     brain  . Arthritis Father   . Cancer Father     liver, atomic testing in [employer]  . Hyperlipidemia Sister   . Hypertension Sister   . Obesity Sister   . Diabetes Sister   . Diabetes Brother   . Hyperlipidemia Sister   . Hypertension Sister   . Obesity Sister    . Diabetes Sister   . Aneurysm Brother     brain  . COPD Brother     History   Social History  . Marital Status: Married    Spouse Name: N/A    Number of Children: 1  . Years of Education: N/A   Occupational History  .  [employer]   Social History Main Topics  . Smoking status: Never Smoker   . Smokeless tobacco: Never Used  . Alcohol Use: 1.0 oz/week    2 drink(s) per week  . Drug Use: No  . Sexual Activity: No   Other Topics Concern  . Not on file   Social History Narrative  . No narrative on file    Current Outpatient Prescriptions on File Prior to Visit  Medication Sig Dispense Refill  . ALPRAZolam (XANAX) 1 MG tablet Take 1 tablet (1 mg total) by mouth daily as needed for sleep or anxiety.  30 tablet  1  . amLODipine-benazepril (LOTREL) 5-20 MG per capsule Take 1 capsule by mouth daily.  90 capsule  1  . clobetasol cream (TEMOVATE) [id number] % Apply 1 application topically 2 (two) times daily.  30 g  0  . fluticasone (FLONASE) 50 MCG/ACT nasal spray Place 1 spray into the nose as needed.      Marland Kitchen  metoprolol (LOPRESSOR) 50 MG tablet Take 1 tablet (50 mg total) by mouth daily.  90 tablet  1  . Pitavastatin Calcium (LIVALO) 2 MG TABS Take 1 tablet (2 mg total) by mouth daily.  90 tablet  1   No current facility-administered medications on file prior to visit.    No Known Allergies  Review of Systems  Review of Systems  Constitutional: Negative for fever, chills and malaise/fatigue.  HENT: Negative for congestion, hearing loss and nosebleeds.   Eyes: Negative for discharge.  Respiratory: Negative for cough, sputum production, shortness of breath and wheezing.   Cardiovascular: Negative for chest pain, palpitations and leg swelling.  Gastrointestinal: Positive for constipation. Negative for heartburn, nausea, vomiting, abdominal pain, diarrhea and blood in stool.  Genitourinary: Positive for frequency. Negative for dysuria, urgency and hematuria.   Musculoskeletal: Positive for joint pain. Negative for back pain, falls and myalgias.       Right shoulder pain secondary to a tear is improving with therapy  Skin: Negative for rash.  Neurological: Negative for dizziness, tremors, sensory change, focal weakness, loss of consciousness, weakness and headaches.  Endo/Heme/Allergies: Negative for polydipsia. Does not bruise/bleed easily.  Psychiatric/Behavioral: Negative for depression and suicidal ideas. The patient is not nervous/anxious and does not have insomnia.     Objective  BP 132/88  Pulse 69  Temp(Src) 98.5 F (36.9 C) (Oral)  Ht 5' 10.5" (1.791 m)  Wt 217 lb 1.3 oz (98.467 kg)  BMI 30.70 kg/m2  SpO2 95%  Physical Exam  Physical Exam  Constitutional: He is oriented to person, place, and time and well-developed, well-nourished, and in no distress. No distress.  HENT:  Head: Normocephalic and atraumatic.  Eyes: Conjunctivae are normal.  Neck: Neck supple. No thyromegaly present.  Cardiovascular: Normal rate, regular rhythm and normal heart sounds.   No murmur heard. Pulmonary/Chest: Effort normal and breath sounds normal. No respiratory distress.  Abdominal: He exhibits no distension and no mass. There is no tenderness.  Musculoskeletal: He exhibits no edema.  Neurological: He is alert and oriented to person, place, and time.  Skin: Skin is warm.  Psychiatric: Memory, affect and judgment normal.    Lab Results  Component Value Date   TSH 1.051 08/18/2013   Lab Results  Component Value Date   WBC 5.1 08/18/2013   HGB 14.4 08/18/2013   HCT 41.5 08/18/2013   MCV 81.7 08/18/2013   PLT 229 08/18/2013   Lab Results  Component Value Date   CREATININE 1.11 08/18/2013   BUN 12 08/18/2013   NA 141 08/18/2013   K 4.5 08/18/2013   CL 104 08/18/2013   CO2 28 08/18/2013   Lab Results  Component Value Date   ALT 28 08/18/2013   AST 38* 08/18/2013   ALKPHOS 72 08/18/2013   BILITOT 0.8 08/18/2013   Lab Results  Component Value  Date   CHOL 196 08/18/2013   Lab Results  Component Value Date   HDL 48 08/18/2013   Lab Results  Component Value Date   LDLCALC 125* 08/18/2013   Lab Results  Component Value Date   TRIG 113 08/18/2013   Lab Results  Component Value Date   CHOLHDL 4.1 08/18/2013     Assessment & Plan  Preventative health care Welcome to [facility] Medicare today, labs reviewed with patient, colonoscopy UTD.  Medicare welcome exam Discussed labs, encouraged heart healthy diet, EKG today. Patient denies ay falls or flare in depression. Encouraged adequate sleep. Encouraged Advanced Directives.   Skin  lesion Referred to dermatology for further consideration.  Allergic state Manageable with current meds   Obesity (BMI 30.0-34.9) Encouraged DASH diet and regular exercise  Hypertension Well controlled, no changes  Hyperlipidemia Tolerating [place], no changes, avoid trans fats  Nocturia Patient has been seen by urology in past. May need new referral  Hyperglycemia Normal hgba1c.

## 2013-09-02 DIAGNOSIS — N401 Enlarged prostate with lower urinary tract symptoms: Secondary | ICD-10-CM | POA: Diagnosis not present

## 2013-09-02 DIAGNOSIS — N138 Other obstructive and reflux uropathy: Secondary | ICD-10-CM | POA: Diagnosis not present

## 2013-09-02 DIAGNOSIS — R351 Nocturia: Secondary | ICD-10-CM | POA: Diagnosis not present

## 2013-10-20 ENCOUNTER — Other Ambulatory Visit: Payer: Self-pay | Admitting: Family Medicine

## 2013-10-20 DIAGNOSIS — T753XXA Motion sickness, initial encounter: Secondary | ICD-10-CM

## 2013-10-20 MED ORDER — SCOPOLAMINE 1 MG/3DAYS TD PT72: 1.0000 | MEDICATED_PATCH | TRANSDERMAL | Status: DC

## 2013-10-21 ENCOUNTER — Telehealth: Payer: Self-pay | Admitting: Family Medicine

## 2013-10-21 NOTE — Telephone Encounter (Signed)
Received paperwork for PA on Transderm-scop, forward to nurse

## 2013-10-27 NOTE — Telephone Encounter (Signed)
Can you check on this. This was put in the "to be faxed" on Tuesday night.

## 2013-10-27 NOTE — Telephone Encounter (Signed)
Called scan ctr awaiting return call

## 2013-10-27 NOTE — Telephone Encounter (Signed)
Pharmacy calling checking on status of PA

## 2013-11-03 ENCOUNTER — Telehealth: Payer: Self-pay

## 2013-11-03 NOTE — Telephone Encounter (Signed)
PA for Transderm-Scop Patch was denied

## 2013-11-03 NOTE — Telephone Encounter (Signed)
Left a detailed message per md that insurance will not cover the Transderm patch

## 2013-11-29 ENCOUNTER — Other Ambulatory Visit: Payer: Self-pay

## 2013-11-29 MED ORDER — PITAVASTATIN CALCIUM 2 MG PO TABS: 2.0000 mg | ORAL_TABLET | Freq: Every day | ORAL | Status: DC

## 2013-11-29 NOTE — Telephone Encounter (Signed)
Per md send in 68 with [facility]

## 2013-11-30 ENCOUNTER — Other Ambulatory Visit: Payer: Self-pay | Admitting: Family Medicine

## 2014-01-05 ENCOUNTER — Other Ambulatory Visit: Payer: Self-pay

## 2014-01-05 MED ORDER — AZELASTINE HCL 0.05 % OP SOLN: 1.0000 [drp] | Freq: Two times a day (BID) | OPHTHALMIC | Status: DC

## 2014-02-06 ENCOUNTER — Encounter: Payer: Self-pay | Admitting: Family Medicine

## 2014-02-06 ENCOUNTER — Ambulatory Visit (INDEPENDENT_AMBULATORY_CARE_PROVIDER_SITE_OTHER): Payer: Medicare Other | Admitting: Family Medicine

## 2014-02-06 VITALS — BP 120/72 | HR 76 | Temp 98.7°F | Ht 70.5 in | Wt 221.1 lb

## 2014-02-06 DIAGNOSIS — R739 Hyperglycemia, unspecified: Secondary | ICD-10-CM

## 2014-02-06 DIAGNOSIS — M1990 Unspecified osteoarthritis, unspecified site: Secondary | ICD-10-CM

## 2014-02-06 DIAGNOSIS — Z86010 Personal history of colonic polyps: Secondary | ICD-10-CM

## 2014-02-06 DIAGNOSIS — I10 Essential (primary) hypertension: Secondary | ICD-10-CM | POA: Diagnosis not present

## 2014-02-06 DIAGNOSIS — R7309 Other abnormal glucose: Secondary | ICD-10-CM

## 2014-02-06 DIAGNOSIS — M129 Arthropathy, unspecified: Secondary | ICD-10-CM | POA: Diagnosis not present

## 2014-02-06 DIAGNOSIS — E785 Hyperlipidemia, unspecified: Secondary | ICD-10-CM

## 2014-02-06 DIAGNOSIS — K5900 Constipation, unspecified: Secondary | ICD-10-CM

## 2014-02-06 HISTORY — DX: Constipation, unspecified: K59.00

## 2014-02-06 LAB — HEMOGLOBIN A1C
Hgb A1c MFr Bld: 5.9 % — ABNORMAL HIGH (ref <5.7)
Mean Plasma Glucose: 123 mg/dL — ABNORMAL HIGH (ref <117)

## 2014-02-06 LAB — CBC
HCT: 37.5 % — ABNORMAL LOW (ref 39.0–52.0)
Hemoglobin: 12.8 g/dL — ABNORMAL LOW (ref 13.0–17.0)
MCH: 28 pg (ref 26.0–34.0)
MCHC: 34.1 g/dL (ref 30.0–36.0)
MCV: 82.1 fL (ref 78.0–100.0)
Platelets: 209 10*3/uL (ref 150–400)
RBC: 4.57 MIL/uL (ref 4.22–5.81)
RDW: 13.9 % (ref 11.5–15.5)
WBC: 5 10*3/uL (ref 4.0–10.5)

## 2014-02-06 NOTE — Progress Notes (Signed)
Patient ID: [name], male   DOB: 1945-09-06, 68 y.o.   MRN: [id number] Nicklous Aburto [id number] 30-Apr-1946 02/06/2014      Progress Note-Follow Up  Subjective  Chief Complaint  Chief Complaint  Patient presents with  . Follow-up    6 month    HPI   Patient is a 68 year old male in today for routine medical care. Has had trouble off and on for years has to strain sometimes after 2-3 days. Other times he does not have to strain and stool are soft. After straining will sometimes see blood ontissue and in bowl for a couple days. No pain associated. Las episode was yesterday. Last colonoscopy was in 2013 and was clear per patient. Denies CP/palp/SOB/HA/congestion/fevers/GI or GU c/o. Taking meds as prescribed   Past Medical History  Diagnosis Date  . History of colon polyps   . Back pain 10/10/2012  . Arthritis   . Hyperlipidemia   . Hypertension   . Obesity (BMI 30.0-34.9)   . Allergic state 10/10/2012  . Preventative health care 11/28/2012  . Personal history of colonic polyps 11/28/2012  . Nocturia 08/21/2013  . Hyperglycemia 08/21/2013    Past Surgical History  Procedure Laterality Date  . Tonsillectomy  1957  . Fracture surgery Left 1989    leg  . Knee surgery Left 2010    arthroscopy, torn carilage  . Knee surgery Right 2012    arthroscopy    Family History  Problem Relation Age of Onset  . Stroke Mother   . Aneurysm Mother     brain  . Arthritis Father   . Cancer Father     liver, atomic testing in [employer]  . Hyperlipidemia Sister   . Hypertension Sister   . Obesity Sister   . Diabetes Sister   . Diabetes Brother   . Hyperlipidemia Sister   . Hypertension Sister   . Obesity Sister   . Diabetes Sister   . Aneurysm Brother     brain  . COPD Brother     History   Social History  . Marital Status: Married    Spouse Name: N/A    Number of Children: 1  . Years of Education: N/A   Occupational History  .  [employer]   Social History Main  Topics  . Smoking status: Never Smoker   . Smokeless tobacco: Never Used  . Alcohol Use: 1.0 oz/week    2 drink(s) per week  . Drug Use: No  . Sexual Activity: No   Other Topics Concern  . Not on file   Social History Narrative  . No narrative on file    Current Outpatient Prescriptions on File Prior to Visit  Medication Sig Dispense Refill  . ALPRAZolam (XANAX) 1 MG tablet Take 1 tablet (1 mg total) by mouth daily as needed for sleep or anxiety.  30 tablet  1  . amLODipine-benazepril (LOTREL) 5-20 MG per capsule TAKE 1 CAPSULE DAILY  90 capsule  1  . azelastine (OPTIVAR) 0.05 % ophthalmic solution Place 1 drop into the left eye 2 (two) times daily.  6 mL  1  . clobetasol cream (TEMOVATE) 6.29 % Apply 1 application topically 2 (two) times daily.  30 g  0  . fluticasone (FLONASE) 50 MCG/ACT nasal spray Place 1 spray into the nose as needed.      Marland Kitchen ibuprofen (ADVIL,MOTRIN) 400 MG tablet Take 1 tablet (400 mg total) by mouth every 6 (six) hours as needed.  [place]  tablet  3  . metoprolol (LOPRESSOR) 50 MG tablet TAKE 1 TABLET DAILY  90 tablet  1  . Pitavastatin Calcium (LIVALO) 2 MG TABS Take 1 tablet (2 mg total) by mouth daily.  90 tablet  1   No current facility-administered medications on file prior to visit.    No Known Allergies  Review of Systems  Review of Systems  Constitutional: Negative for fever and malaise/fatigue.  HENT: Negative for congestion.   Eyes: Negative for discharge.  Respiratory: Negative for shortness of breath.   Cardiovascular: Negative for chest pain, palpitations and leg swelling.  Gastrointestinal: Positive for constipation and blood in stool. Negative for nausea, abdominal pain, diarrhea and melena.  Genitourinary: Negative for dysuria.  Musculoskeletal: Negative for falls.  Skin: Negative for rash.  Neurological: Negative for loss of consciousness and headaches.  Endo/Heme/Allergies: Negative for polydipsia.  Psychiatric/Behavioral: Negative for  depression and suicidal ideas. The patient is not nervous/anxious and does not have insomnia.     Objective  BP 120/72  Pulse 76  Temp(Src) 98.7 F (37.1 C) (Oral)  Ht 5' 10.5" (1.791 m)  Wt 221 lb 1.3 oz (100.281 kg)  BMI 31.26 kg/m2  SpO2 97%  Physical Exam  Physical Exam  Constitutional: He is oriented to person, place, and time and well-developed, well-nourished, and in no distress. No distress.  HENT:  Head: Normocephalic and atraumatic.  Right Ear: External ear normal.  Left Ear: External ear normal.  Nose: Nose normal.  Mouth/Throat: Oropharynx is clear and moist.  Eyes: Conjunctivae are normal. Right eye exhibits no discharge. Left eye exhibits no discharge.  Neck: Neck supple. No thyromegaly present.  Cardiovascular: Normal rate, regular rhythm and normal heart sounds.   No murmur heard. Pulmonary/Chest: Effort normal and breath sounds normal. No respiratory distress.  Abdominal: Soft. Bowel sounds are normal. He exhibits no distension and no mass. There is no tenderness.  Musculoskeletal: He exhibits no edema.  Neurological: He is alert and oriented to person, place, and time.  Skin: Skin is warm.  Psychiatric: Memory, affect and judgment normal.    Lab Results  Component Value Date   TSH 1.051 08/18/2013   Lab Results  Component Value Date   WBC 5.1 08/18/2013   HGB 14.4 08/18/2013   HCT 41.5 08/18/2013   MCV 81.7 08/18/2013   PLT 229 08/18/2013   Lab Results  Component Value Date   CREATININE 1.11 08/18/2013   BUN 12 08/18/2013   NA 141 08/18/2013   K 4.5 08/18/2013   CL 104 08/18/2013   CO2 28 08/18/2013   Lab Results  Component Value Date   ALT 28 08/18/2013   AST 38* 08/18/2013   ALKPHOS 72 08/18/2013   BILITOT 0.8 08/18/2013   Lab Results  Component Value Date   CHOL 196 08/18/2013   Lab Results  Component Value Date   HDL 48 08/18/2013   Lab Results  Component Value Date   LDLCALC 125* 08/18/2013   Lab Results  Component Value Date   TRIG  113 08/18/2013   Lab Results  Component Value Date   CHOLHDL 4.1 08/18/2013     Assessment & Plan  Hypertension Well controlled, no changes to meds. Encouraged heart healthy diet such as the DASH diet and exercise as tolerated.   Arthritis Persistent but better with exercise, encouraged to continue moderate exercise and start a fatty acid supplement such as Flaxseed oil or krill oil caps daily  Hyperglycemia hgba1c acceptable, minimize simple carbs. Increase exercise  as tolerated.   Hyperlipidemia Tolerating statin, encouraged heart healthy diet, avoid trans fats, minimize simple carbs and saturated fats. Increase exercise as tolerated  History of colon polyps Last colonoscopy in [place] with Dr Ferdinand Lango in 2013  was clear per patient, when due for next would like to change providers  Unspecified constipation Has had trouble off and on for years has to strain sometimes after 2-3 days. Other times he does not have to strain and stool are soft. After straining will sometimes see blood ontissue and in bowl for a couple days. No pain associated. Las episode was yesterday. Encouraged increased hydration and fiber in diet. Daily probiotics. If bowels not moving can use MOM 2 tbls po in 4 oz of warm prune juice by mouth every 2-3 days. If no results then repeat in 4 hours with  Dulcolax suppository pr, may repeat again in 4 more hours as needed. Seek care if symptoms worsen. Consider daily Miralax and/or Dulcolax if symptoms persist. If no improvement with conservative measures then call for referral, he acknowledges only drinking roughly 32 of fluid daily

## 2014-02-06 NOTE — Assessment & Plan Note (Signed)
Tolerating statin, encouraged heart healthy diet, avoid trans fats, minimize simple carbs and saturated fats. Increase exercise as tolerated 

## 2014-02-06 NOTE — Progress Notes (Signed)
Pre visit review using our clinic review tool, if applicable. No additional management support is needed unless otherwise documented below in the visit note. 

## 2014-02-06 NOTE — Assessment & Plan Note (Signed)
Last colonoscopy in [place] with Dr Ferdinand Lango in 2013  was clear per patient, when due for next would like to change providers

## 2014-02-06 NOTE — Assessment & Plan Note (Signed)
hgba1c acceptable, minimize simple carbs. Increase exercise as tolerated.  

## 2014-02-06 NOTE — Patient Instructions (Signed)
Prune juice daily. Encouraged increased hydration and fiber in diet. Daily probiotics. If bowels not moving can use MOM 2 tbls po in 4 oz of warm prune juice by mouth every 2-3 days. If no results then repeat in 4 hours with  Dulcolax suppository pr, may repeat again in 4 more hours as needed. Seek care if symptoms worsen. Consider daily Miralax and/or Dulcolax if symptoms persist.   Constipation Constipation is when a person has fewer than three bowel movements a week, has difficulty having a bowel movement, or has stools that are dry, hard, or larger than normal. As people grow older, constipation is more common. If you try to fix constipation with medicines that make you have a bowel movement (laxatives), the problem may get worse. Long-term laxative use may cause the muscles of the colon to become weak. A low-fiber diet, not taking in enough fluids, and taking certain medicines may make constipation worse.  CAUSES   Certain medicines, such as antidepressants, pain medicine, iron supplements, antacids, and water pills.   Certain diseases, such as diabetes, irritable bowel syndrome (IBS), thyroid disease, or depression.   Not drinking enough water.   Not eating enough fiber-rich foods.   Stress or travel.   Lack of physical activity or exercise.   Ignoring the urge to have a bowel movement.   Using laxatives too much.  SIGNS AND SYMPTOMS   Having fewer than three bowel movements a week.   Straining to have a bowel movement.   Having stools that are hard, dry, or larger than normal.   Feeling full or bloated.   Pain in the lower abdomen.   Not feeling relief after having a bowel movement.  DIAGNOSIS  Your health care provider will take a medical history and perform a physical exam. Further testing may be done for severe constipation. Some tests may include:  A barium enema X-ray to examine your rectum, colon, and, sometimes, your small intestine.   A  sigmoidoscopy to examine your lower colon.   A colonoscopy to examine your entire colon. TREATMENT  Treatment will depend on the severity of your constipation and what is causing it. Some dietary treatments include drinking more fluids and eating more fiber-rich foods. Lifestyle treatments may include regular exercise. If these diet and lifestyle recommendations do not help, your health care provider may recommend taking over-the-counter laxative medicines to help you have bowel movements. Prescription medicines may be prescribed if over-the-counter medicines do not work.  HOME CARE INSTRUCTIONS   Eat foods that have a lot of fiber, such as fruits, vegetables, whole grains, and beans.  Limit foods high in fat and processed sugars, such as french fries, hamburgers, cookies, candies, and soda.   A fiber supplement may be added to your diet if you cannot get enough fiber from foods.   Drink enough fluids to keep your urine clear or pale yellow.   Exercise regularly or as directed by your health care provider.   Go to the restroom when you have the urge to go. Do not hold it.   Only take over-the-counter or prescription medicines as directed by your health care provider. Do not take other medicines for constipation without talking to your health care provider first.  [place] IF:   You have bright red blood in your stool.   Your constipation lasts for more than 4 days or gets worse.   You have abdominal or rectal pain.   You have thin, pencil-like stools.  You have unexplained weight loss. MAKE SURE YOU:   Understand these instructions.  Will watch your condition.  Will get help right away if you are not doing well or get worse. Document Released: 03/14/2004 Document Revised: 06/21/2013 Document Reviewed: 03/28/2013 [facility] Patient Information 2015 [place], [place]. This information is not intended to replace advice given to you by your health care  provider. Make sure you discuss any questions you have with your health care provider.

## 2014-02-06 NOTE — Assessment & Plan Note (Signed)
Has had trouble off and on for years has to strain sometimes after 2-3 days. Other times he does not have to strain and stool are soft. After straining will sometimes see blood ontissue and in bowl for a couple days. No pain associated. Las episode was yesterday. Encouraged increased hydration and fiber in diet. Daily probiotics. If bowels not moving can use MOM 2 tbls po in 4 oz of warm prune juice by mouth every 2-3 days. If no results then repeat in 4 hours with  Dulcolax suppository pr, may repeat again in 4 more hours as needed. Seek care if symptoms worsen. Consider daily Miralax and/or Dulcolax if symptoms persist. If no improvement with conservative measures then call for referral, he acknowledges only drinking roughly 32 of fluid daily

## 2014-02-06 NOTE — Assessment & Plan Note (Signed)
Persistent but better with exercise, encouraged to continue moderate exercise and start a fatty acid supplement such as Flaxseed oil or krill oil caps daily

## 2014-02-06 NOTE — Assessment & Plan Note (Signed)
Well controlled, no changes to meds. Encouraged heart healthy diet such as the DASH diet and exercise as tolerated.  °

## 2014-02-07 LAB — RENAL FUNCTION PANEL
Albumin: 4.3 g/dL (ref 3.5–5.2)
BUN: 15 mg/dL (ref 6–23)
CO2: 26 mEq/L (ref 19–32)
Calcium: 9.3 mg/dL (ref 8.4–10.5)
Chloride: 106 mEq/L (ref 96–112)
Creat: 1.15 mg/dL (ref 0.50–1.35)
Glucose, Bld: 116 mg/dL — ABNORMAL HIGH (ref 70–99)
Phosphorus: 3.6 mg/dL (ref 2.3–4.6)
Potassium: 4.6 mEq/L (ref 3.5–5.3)
Sodium: 140 mEq/L (ref 135–145)

## 2014-02-07 LAB — LIPID PANEL
Cholesterol: 145 mg/dL (ref 0–200)
HDL: 36 mg/dL — ABNORMAL LOW (ref >39)
LDL Cholesterol: 78 mg/dL (ref 0–99)
Total CHOL/HDL Ratio: 4 Ratio
Triglycerides: 153 mg/dL — ABNORMAL HIGH (ref <150)
VLDL: 31 mg/dL (ref 0–40)

## 2014-02-07 LAB — TSH: TSH: 1.34 u[IU]/mL (ref 0.350–4.500)

## 2014-02-07 LAB — HEPATIC FUNCTION PANEL
ALT: 26 U/L (ref 0–53)
AST: 19 U/L (ref 0–37)
Albumin: 4.3 g/dL (ref 3.5–5.2)
Alkaline Phosphatase: 73 U/L (ref 39–117)
Bilirubin, Direct: 0.1 mg/dL (ref 0.0–0.3)
Indirect Bilirubin: 0.4 mg/dL (ref 0.2–1.2)
Total Bilirubin: 0.5 mg/dL (ref 0.2–1.2)
Total Protein: 6.8 g/dL (ref 6.0–8.3)

## 2014-02-22 ENCOUNTER — Other Ambulatory Visit: Payer: Medicare Other

## 2014-02-27 ENCOUNTER — Other Ambulatory Visit (INDEPENDENT_AMBULATORY_CARE_PROVIDER_SITE_OTHER): Payer: Medicare Other

## 2014-02-27 ENCOUNTER — Telehealth: Payer: Self-pay

## 2014-02-27 DIAGNOSIS — Z86010 Personal history of colonic polyps: Secondary | ICD-10-CM

## 2014-02-27 LAB — FECAL OCCULT BLOOD, IMMUNOCHEMICAL: Fecal Occult Bld: NEGATIVE

## 2014-02-27 NOTE — Telephone Encounter (Signed)
[name] with [facility] called stating that they needed an IFOB order placed  Order placed

## 2014-04-13 ENCOUNTER — Ambulatory Visit: Payer: Medicare Other

## 2014-04-23 ENCOUNTER — Other Ambulatory Visit: Payer: Self-pay | Admitting: Family

## 2014-04-24 NOTE — Telephone Encounter (Signed)
FYI

## 2014-06-01 ENCOUNTER — Other Ambulatory Visit: Payer: Self-pay | Admitting: Family Medicine

## 2014-06-19 ENCOUNTER — Ambulatory Visit (INDEPENDENT_AMBULATORY_CARE_PROVIDER_SITE_OTHER): Payer: Medicare Other | Admitting: Medical

## 2014-06-19 ENCOUNTER — Encounter: Payer: Self-pay | Admitting: Medical

## 2014-06-19 VITALS — BP 133/81 | HR 74 | Temp 98.3°F | Ht 69.75 in | Wt 219.8 lb

## 2014-06-19 DIAGNOSIS — J0101 Acute recurrent maxillary sinusitis: Secondary | ICD-10-CM

## 2014-06-19 DIAGNOSIS — J322 Chronic ethmoidal sinusitis: Secondary | ICD-10-CM | POA: Insufficient documentation

## 2014-06-19 MED ORDER — AZITHROMYCIN 250 MG PO TABS: ORAL_TABLET | ORAL | Status: DC

## 2014-06-19 MED ORDER — HYDROCODONE-HOMATROPINE 5-1.5 MG/5ML PO SYRP: 5.0000 mL | ORAL_SOLUTION | Freq: Three times a day (TID) | ORAL | Status: DC | PRN

## 2014-06-19 MED ORDER — FLUTICASONE PROPIONATE 50 MCG/ACT NA SUSP: 2.0000 | Freq: Every day | NASAL | Status: DC

## 2014-06-19 NOTE — Progress Notes (Signed)
Subjective:    Patient ID: [name], male    DOB: [birth year], 68 y.o.   MRN: [id number]  HPI   Pt in today reporting  cough, nasal congestion and runny nose for  7  Days. At first started with a lot of pnd that caused cough.Pt states he can bring up some colored mucous. Feels chest congestion. Some left side sinus pressure. Pt has been using some flonase but ran out 3 days ago.  His sinus pressure and cough are worst symptoms.  Does report hx of occasoinal bronchitis.  Associated symptoms( below yes or no)  Fever-no Chills-no Chest congestion-yes Sneezing- no Itching eyes-no Sore throat- yes Post-nasal drainage-yes Wheezing-no Purulent nasal drainage-no Fatigue-mild  No hx of asthma. No wheezing. Non smoker.  Past Medical History  Diagnosis Date  . History of colon polyps   . Back pain 10/10/2012  . Arthritis   . Hyperlipidemia   . Hypertension   . Obesity (BMI 30.0-34.9)   . Allergic state 10/10/2012  . Preventative health care 11/28/2012  . Personal history of colonic polyps 11/28/2012  . Nocturia 08/21/2013  . Hyperglycemia 08/21/2013  . Unspecified constipation 02/06/2014    History   Social History  . Marital Status: Married    Spouse Name: N/A    Number of Children: 1  . Years of Education: N/A   Occupational History  .  [employer]   Social History Main Topics  . Smoking status: Never Smoker   . Smokeless tobacco: Never Used  . Alcohol Use: 1.0 oz/week    2 drink(s) per week  . Drug Use: No  . Sexual Activity: No   Other Topics Concern  . Not on file   Social History Narrative    Past Surgical History  Procedure Laterality Date  . Tonsillectomy  1957  . Fracture surgery Left 1989    leg  . Knee surgery Left 2010    arthroscopy, torn carilage  . Knee surgery Right 2012    arthroscopy    Family History  Problem Relation Age of Onset  . Stroke Mother   . Aneurysm Mother     brain  . Arthritis Father   . Cancer Father    liver, atomic testing in [employer]  . Hyperlipidemia Sister   . Hypertension Sister   . Obesity Sister   . Diabetes Sister   . Diabetes Brother   . Hyperlipidemia Sister   . Hypertension Sister   . Obesity Sister   . Diabetes Sister   . Aneurysm Brother     brain  . COPD Brother     No Known Allergies  Current Outpatient Prescriptions on File Prior to Visit  Medication Sig Dispense Refill  . ALPRAZolam (XANAX) 1 MG tablet Take 1 tablet (1 mg total) by mouth daily as needed for sleep or anxiety. 30 tablet 1  . amLODipine-benazepril (LOTREL) 5-20 MG per capsule TAKE 1 CAPSULE DAILY 90 capsule 1  . azelastine (OPTIVAR) 0.05 % ophthalmic solution Place 1 drop into the left eye 2 (two) times daily. 6 mL 1  . clobetasol cream (TEMOVATE) 4.97 % Apply 1 application topically 2 (two) times daily. 30 g 0  . fluticasone (FLONASE) 50 MCG/ACT nasal spray Place 1 spray into the nose as needed.    Marland Kitchen ibuprofen (ADVIL,MOTRIN) 400 MG tablet TAKE 1 TABLET (400 MG TOTAL) BY MOUTH EVERY 6 (SIX) HOURS AS NEEDED. 30 tablet 2  . metoprolol (LOPRESSOR) 50 MG tablet TAKE 1 TABLET DAILY 90  tablet 1  . Pitavastatin Calcium (LIVALO) 2 MG TABS Take 1 tablet (2 mg total) by mouth daily. 90 tablet 1   No current facility-administered medications on file prior to visit.    BP 133/81 mmHg  Pulse 74  Temp(Src) 98.3 F (36.8 C) (Oral)  Ht 5' 9.75" (1.772 m)  Wt 219 lb 12.8 oz (99.701 kg)  BMI 31.75 kg/m2  SpO2 99%        Review of Systems  Constitutional: Positive for fatigue. Negative for fever and chills.  HENT: Positive for congestion, postnasal drip, rhinorrhea, sinus pressure and sore throat.   Respiratory: Positive for cough. Negative for chest tightness and wheezing.   Cardiovascular: Negative for chest pain and palpitations.  Musculoskeletal: Negative for back pain and neck pain.  Neurological: Negative for dizziness and headaches.  Hematological: Negative for adenopathy. Does not  bruise/bleed easily.       Objective:   Physical Exam  General  Mental Status - Alert. General Appearance - Well groomed. Not in acute distress.  Skin Rashes- No Rashes.  HEENT Head- Normal. Ear Auditory Canal - Left- Normal. Right - Normal.Tympanic Membrane- Left- Normal. Right- Normal. Eye Sclera/Conjunctiva- Left- Normal. Right- Normal. Nose & Sinuses Nasal Mucosa- Left-  Boggy and Congested. Right-  Boggy and  Congested.Bilateral tl  maxillary + frontal sinus pressure. Mouth & Throat Lips: Upper Lip- Normal: no dryness, cracking, pallor, cyanosis, or vesicular eruption. Lower Lip-Normal: no dryness, cracking, pallor, cyanosis or vesicular eruption. Buccal Mucosa- Bilateral- No Aphthous ulcers. Oropharynx- No Discharge or Erythema. Tonsils: Characteristics- Bilateral- No Erythema or Congestion. Size/Enlargement- Bilateral- No enlargement. Discharge- bilateral-None.  Neck Neck- Supple. No Masses.   Chest and Lung Exam Auscultation: Breath Sounds:-Clear even and unlabored. Only mild faint upper lobe rhonchi.  Cardiovascular Auscultation:Rythm- Regular, rate and rhythm. Murmurs & Other Heart Sounds:Ausculatation of the heart reveal- No Murmurs.  Lymphatic Head & Neck General Head & Neck Lymphatics: Bilateral: Description- No Localized lymphadenopathy.        Assessment & Plan:

## 2014-06-19 NOTE — Assessment & Plan Note (Signed)
You appear to have  Sinusitis and bronchitis. Rest hydrate and tylenol for fever. I am prescribing hydrocodone based cough medicine, and azithromycin  antibiotic. For your nasal congestion rx flonase nasal steroid.   You should gradually get better. If not then notify us and would recommend a chest xray.

## 2014-06-19 NOTE — Progress Notes (Signed)
Pre visit review using our clinic review tool, if applicable. No additional management support is needed unless otherwise documented below in the visit note. 

## 2014-06-19 NOTE — Patient Instructions (Signed)
You appear to have  Sinusitis and bronchitis. Rest hydrate and tylenol for fever. I am prescribing hydrocodone based cough medicine, and azithromycin  antibiotic. For your nasal congestion rx flonase nasal steroid.   You should gradually get better. If not then notify us and would recommend a chest xray.  Follow up in 7-10 days or as needed

## 2014-07-01 ENCOUNTER — Other Ambulatory Visit: Payer: Self-pay | Admitting: Family Medicine

## 2014-07-03 NOTE — Telephone Encounter (Signed)
Livalo refilled per protocol. JG//CMA

## 2014-07-25 ENCOUNTER — Encounter: Payer: Self-pay | Admitting: Family Medicine

## 2014-07-25 ENCOUNTER — Ambulatory Visit (INDEPENDENT_AMBULATORY_CARE_PROVIDER_SITE_OTHER): Payer: Medicare Other | Admitting: Family Medicine

## 2014-07-25 VITALS — BP 136/86 | HR 76 | Temp 98.4°F | Resp 16 | Ht 69.75 in | Wt 221.2 lb

## 2014-07-25 DIAGNOSIS — I10 Essential (primary) hypertension: Secondary | ICD-10-CM | POA: Diagnosis not present

## 2014-07-25 DIAGNOSIS — K5900 Constipation, unspecified: Secondary | ICD-10-CM | POA: Diagnosis not present

## 2014-07-25 DIAGNOSIS — E669 Obesity, unspecified: Secondary | ICD-10-CM | POA: Diagnosis not present

## 2014-07-25 DIAGNOSIS — K625 Hemorrhage of anus and rectum: Secondary | ICD-10-CM | POA: Diagnosis not present

## 2014-07-25 MED ORDER — HYDROCODONE-HOMATROPINE 5-1.5 MG/5ML PO SYRP: 5.0000 mL | ORAL_SOLUTION | Freq: Three times a day (TID) | ORAL | Status: DC | PRN

## 2014-07-25 MED ORDER — HYDROCORTISONE ACETATE 25 MG RE SUPP: 25.0000 mg | Freq: Two times a day (BID) | RECTAL | Status: DC

## 2014-07-25 MED ORDER — CEFDINIR 300 MG PO CAPS: 300.0000 mg | ORAL_CAPSULE | Freq: Two times a day (BID) | ORAL | Status: AC

## 2014-07-25 NOTE — Progress Notes (Signed)
Pre visit review using our clinic review tool, if applicable. No additional management support is needed unless otherwise documented below in the visit note/SLS  

## 2014-07-25 NOTE — Patient Instructions (Signed)
mucinex twice daily x 14 days Add a fiber supplement such as Benefiber or Metamucil Add Zinc 50 mg daily, coldeeze 64 oz of clear fluids daily   Sinusitis Sinusitis is redness, soreness, and inflammation of the paranasal sinuses. Paranasal sinuses are air pockets within the bones of your face (beneath the eyes, the middle of the forehead, or above the eyes). In healthy paranasal sinuses, mucus is able to drain out, and air is able to circulate through them by way of your nose. However, when your paranasal sinuses are inflamed, mucus and air can become trapped. This can allow bacteria and other germs to grow and cause infection. Sinusitis can develop quickly and last only a short time (acute) or continue over a long period (chronic). Sinusitis that lasts for more than 12 weeks is considered chronic.  CAUSES  Causes of sinusitis include:  Allergies.  Structural abnormalities, such as displacement of the cartilage that separates your nostrils (deviated septum), which can decrease the air flow through your nose and sinuses and affect sinus drainage.  Functional abnormalities, such as when the small hairs (cilia) that line your sinuses and help remove mucus do not work properly or are not present. SIGNS AND SYMPTOMS  Symptoms of acute and chronic sinusitis are the same. The primary symptoms are pain and pressure around the affected sinuses. Other symptoms include:  Upper toothache.  Earache.  Headache.  Bad breath.  Decreased sense of smell and taste.  A cough, which worsens when you are lying flat.  Fatigue.  Fever.  Thick drainage from your nose, which often is green and may contain pus (purulent).  Swelling and warmth over the affected sinuses. DIAGNOSIS  Your health care provider will perform a physical exam. During the exam, your health care provider may:  Look in your nose for signs of abnormal growths in your nostrils (nasal polyps).  Tap over the affected sinus to check  for signs of infection.  View the inside of your sinuses (endoscopy) using an imaging device that has a light attached (endoscope). If your health care provider suspects that you have chronic sinusitis, one or more of the following tests may be recommended:  Allergy tests.  Nasal culture. A sample of mucus is taken from your nose, sent to a lab, and screened for bacteria.  Nasal cytology. A sample of mucus is taken from your nose and examined by your health care provider to determine if your sinusitis is related to an allergy. TREATMENT  Most cases of acute sinusitis are related to a viral infection and will resolve on their own within 10 days. Sometimes medicines are prescribed to help relieve symptoms (pain medicine, decongestants, nasal steroid sprays, or saline sprays).  However, for sinusitis related to a bacterial infection, your health care provider will prescribe antibiotic medicines. These are medicines that will help kill the bacteria causing the infection.  Rarely, sinusitis is caused by a fungal infection. In theses cases, your health care provider will prescribe antifungal medicine. For some cases of chronic sinusitis, surgery is needed. Generally, these are cases in which sinusitis recurs more than 3 times per year, despite other treatments. HOME CARE INSTRUCTIONS   Drink plenty of water. Water helps thin the mucus so your sinuses can drain more easily.  Use a humidifier.  Inhale steam 3 to 4 times a day (for example, sit in the bathroom with the shower running).  Apply a warm, moist washcloth to your face 3 to 4 times a day, or as directed  by your health care provider.  Use saline nasal sprays to help moisten and clean your sinuses.  Take medicines only as directed by your health care provider.  If you were prescribed either an antibiotic or antifungal medicine, finish it all even if you start to feel better. SEEK IMMEDIATE MEDICAL CARE IF:  You have increasing pain or  severe headaches.  You have nausea, vomiting, or drowsiness.  You have swelling around your face.  You have vision problems.  You have a stiff neck.  You have difficulty breathing. MAKE SURE YOU:   Understand these instructions.  Will watch your condition.  Will get help right away if you are not doing well or get worse. Document Released: 06/16/2005 Document Revised: 10/31/2013 Document Reviewed: 07/01/2011 [facility] Patient Information 2015 [place], [place]. This information is not intended to replace advice given to you by your health care provider. Make sure you discuss any questions you have with your health care provider.

## 2014-07-28 ENCOUNTER — Encounter: Payer: Self-pay | Admitting: *Deleted

## 2014-07-30 ENCOUNTER — Encounter: Payer: Self-pay | Admitting: Family Medicine

## 2014-07-30 DIAGNOSIS — K625 Hemorrhage of anus and rectum: Secondary | ICD-10-CM

## 2014-07-30 HISTORY — DX: Hemorrhage of anus and rectum: K62.5

## 2014-07-30 NOTE — Assessment & Plan Note (Signed)
Encouraged increased fiber and fluids. Anusol HC suppositories qhs and referred to GI seek care if worsens

## 2014-07-30 NOTE — Assessment & Plan Note (Signed)
Encouraged DASH diet, decrease po intake and increase exercise as tolerated. Needs 7-8 hours of sleep nightly. Avoid trans fats, eat small, frequent meals every 4-5 hours with lean proteins, complex carbs and healthy fats. Minimize simple carbs 

## 2014-07-30 NOTE — Progress Notes (Signed)
Patient ID: [name], male   DOB: 29-Jan-1946, 69 y.o.   MRN: [id number]   [name]  [id number] 12/22/1945 07/30/2014      Progress Note-Follow Up  Subjective  Chief Complaint  Chief Complaint  Patient presents with  . Blood In Stools    Pt c/o blood in stools & constipation, consistently x1 wk    HPI  Patient is a 69 y.o. male in today for routine medical care. Patient is in today noting blood in his stool daily for roughly a week now. He acknowledges he was straining with a BM. Sees BRB only with BM once daily. Denies melena, abdominal pain, f/c/n/v. Otherwise feels well. Denies CP/palp/SOB/HA/congestion/fevers/GU c/o. Taking meds as prescribed  Past Medical History  Diagnosis Date  . Vitamin D deficiency   . Back pain 10/10/2012  . Arthritis   . Hyperlipidemia   . Hypertension   . Obesity (BMI 30.0-34.9)   . Allergic state 10/10/2012  . Osteoporosis   . Anemia   . Nocturia 08/21/2013  . Hyperglycemia 08/21/2013  . Unspecified constipation 02/06/2014  . Internal hemorrhoids   . Tubular adenoma of colon   . Rectal bleeding 07/30/2014    Past Surgical History  Procedure Laterality Date  . Tonsillectomy  1957  . Fracture surgery Left 1989    leg  . Knee surgery Left 2010    arthroscopy, torn carilage  . Knee surgery Right 2012    arthroscopy    Family History  Problem Relation Age of Onset  . Stroke Mother   . Aneurysm Mother     brain  . Arthritis Father   . Liver cancer Father     liver, atomic testing in [employer]  . Hyperlipidemia Sister   . Hypertension Sister   . Obesity Sister   . Diabetes Sister   . Diabetes Brother   . Hyperlipidemia Sister   . Hypertension Sister   . Obesity Sister   . Diabetes Sister   . Aneurysm Brother     brain  . COPD Brother     History   Social History  . Marital Status: Married    Spouse Name: N/A    Number of Children: 1  . Years of Education: N/A   Occupational History  .  [employer]    Social History Main Topics  . Smoking status: Never Smoker   . Smokeless tobacco: Never Used  . Alcohol Use: 1.0 oz/week    2 drink(s) per week  . Drug Use: No  . Sexual Activity: No   Other Topics Concern  . Not on file   Social History Narrative    Current Outpatient Prescriptions on File Prior to Visit  Medication Sig Dispense Refill  . ALPRAZolam (XANAX) 1 MG tablet Take 1 tablet (1 mg total) by mouth daily as needed for sleep or anxiety. 30 tablet 1  . amLODipine-benazepril (LOTREL) 5-20 MG per capsule TAKE 1 CAPSULE DAILY 90 capsule 1  . azelastine (OPTIVAR) 0.05 % ophthalmic solution Place 1 drop into the left eye 2 (two) times daily. 6 mL 1  . clobetasol cream (TEMOVATE) 7.00 % Apply 1 application topically 2 (two) times daily. 30 g 0  . fluticasone (FLONASE) 50 MCG/ACT nasal spray Place 2 sprays into both nostrils daily. 16 g 1  . ibuprofen (ADVIL,MOTRIN) 400 MG tablet TAKE 1 TABLET (400 MG TOTAL) BY MOUTH EVERY 6 (SIX) HOURS AS NEEDED. 30 tablet 2  . LIVALO 2 MG TABS TAKE 1 TABLET DAILY 90  tablet 0  . metoprolol (LOPRESSOR) 50 MG tablet TAKE 1 TABLET DAILY 90 tablet 1   No current facility-administered medications on file prior to visit.    No Known Allergies  Review of Systems  Review of Systems  Constitutional: Negative for fever and malaise/fatigue.  HENT: Negative for congestion.   Eyes: Negative for discharge.  Respiratory: Negative for shortness of breath.   Cardiovascular: Negative for chest pain, palpitations and leg swelling.  Gastrointestinal: Positive for constipation and blood in stool. Negative for nausea, abdominal pain, diarrhea and melena.  Genitourinary: Negative for dysuria.  Musculoskeletal: Negative for falls.  Skin: Negative for rash.  Neurological: Negative for loss of consciousness and headaches.  Endo/Heme/Allergies: Negative for polydipsia.  Psychiatric/Behavioral: Negative for depression and suicidal ideas. The patient is not  nervous/anxious and does not have insomnia.     Objective  BP 136/86 mmHg  Pulse 76  Temp(Src) 98.4 F (36.9 C) (Oral)  Resp 16  Ht 5' 9.75" (1.772 m)  Wt 221 lb 4 oz (100.358 kg)  BMI 31.96 kg/m2  SpO2 99%  Physical Exam  Physical Exam  Constitutional: He is oriented to person, place, and time and well-developed, well-nourished, and in no distress. No distress.  HENT:  Head: Normocephalic and atraumatic.  Eyes: Conjunctivae are normal.  Neck: Neck supple. No thyromegaly present.  Cardiovascular: Normal rate, regular rhythm and normal heart sounds.   No murmur heard. Pulmonary/Chest: Effort normal and breath sounds normal. No respiratory distress.  Abdominal: He exhibits no distension and no mass. There is no tenderness.  Musculoskeletal: He exhibits no edema.  Neurological: He is alert and oriented to person, place, and time.  Skin: Skin is warm.  Psychiatric: Memory, affect and judgment normal.    Lab Results  Component Value Date   TSH 1.340 02/06/2014   Lab Results  Component Value Date   WBC 5.0 02/06/2014   HGB 12.8* 02/06/2014   HCT 37.5* 02/06/2014   MCV 82.1 02/06/2014   PLT 209 02/06/2014   Lab Results  Component Value Date   CREATININE 1.15 02/06/2014   BUN 15 02/06/2014   NA 140 02/06/2014   K 4.6 02/06/2014   CL 106 02/06/2014   CO2 26 02/06/2014   Lab Results  Component Value Date   ALT 26 02/06/2014   AST 19 02/06/2014   ALKPHOS 73 02/06/2014   BILITOT 0.5 02/06/2014   Lab Results  Component Value Date   CHOL 145 02/06/2014   Lab Results  Component Value Date   HDL 36* 02/06/2014   Lab Results  Component Value Date   LDLCALC 78 02/06/2014   Lab Results  Component Value Date   TRIG 153* 02/06/2014   Lab Results  Component Value Date   CHOLHDL 4.0 02/06/2014     Assessment & Plan  Hypertension Well controlled, no changes to meds. Encouraged heart healthy diet such as the DASH diet and exercise as tolerated.     Constipation Encouraged increased hydration and fiber in diet. Daily probiotics. If bowels not moving can use MOM 2 tbls po in 4 oz of warm prune juice by mouth every 2-3 days. If no results then repeat in 4 hours with  Dulcolax suppository pr, may repeat again in 4 more hours as needed. Seek care if symptoms worsen. Consider daily Miralax and/or Dulcolax if symptoms persist.    Obesity (BMI 30.0-34.9) Encouraged DASH diet, decrease po intake and increase exercise as tolerated. Needs 7-8 hours of sleep nightly. Avoid trans fats,  eat small, frequent meals every 4-5 hours with lean proteins, complex carbs and healthy fats. Minimize simple carbs   Rectal bleeding Encouraged increased fiber and fluids. Anusol HC suppositories qhs and referred to GI seek care if worsens

## 2014-07-30 NOTE — Assessment & Plan Note (Signed)
Well controlled, no changes to meds. Encouraged heart healthy diet such as the DASH diet and exercise as tolerated.  °

## 2014-07-30 NOTE — Assessment & Plan Note (Addendum)
Encouraged increased hydration and fiber in diet. Daily probiotics. If bowels not moving can use MOM 2 tbls po in 4 oz of warm prune juice by mouth every 2-3 days. If no results then repeat in 4 hours with  Dulcolax suppository pr, may repeat again in 4 more hours as needed. Seek care if symptoms worsen. Consider daily Miralax and/or Dulcolax if symptoms persist.  

## 2014-07-31 ENCOUNTER — Encounter: Payer: Self-pay | Admitting: Physician Assistant

## 2014-07-31 ENCOUNTER — Ambulatory Visit (INDEPENDENT_AMBULATORY_CARE_PROVIDER_SITE_OTHER): Payer: Medicare Other | Admitting: Physician Assistant

## 2014-07-31 VITALS — BP 134/82 | HR 76 | Ht 69.25 in | Wt 220.4 lb

## 2014-07-31 DIAGNOSIS — K648 Other hemorrhoids: Secondary | ICD-10-CM | POA: Diagnosis not present

## 2014-07-31 DIAGNOSIS — K5900 Constipation, unspecified: Secondary | ICD-10-CM

## 2014-07-31 DIAGNOSIS — Z86010 Personal history of colonic polyps: Secondary | ICD-10-CM | POA: Diagnosis not present

## 2014-07-31 DIAGNOSIS — K644 Residual hemorrhoidal skin tags: Secondary | ICD-10-CM

## 2014-07-31 DIAGNOSIS — K625 Hemorrhage of anus and rectum: Secondary | ICD-10-CM | POA: Diagnosis not present

## 2014-07-31 MED ORDER — HYDROCORTISONE ACETATE 25 MG RE SUPP: 25.0000 mg | Freq: Two times a day (BID) | RECTAL | Status: DC

## 2014-07-31 MED ORDER — POLYETHYLENE GLYCOL 3350 17 GM/SCOOP PO POWD: ORAL | Status: DC

## 2014-07-31 MED ORDER — MOVIPREP 100 G PO SOLR: 1.0000 | Freq: Once | ORAL | Status: DC

## 2014-07-31 NOTE — Patient Instructions (Addendum)
You have been scheduled for a colonoscopy. Please follow written instructions given to you at your visit today.  Please pick up your prep kit at the pharmacy within the next 1-3 days. If you use inhalers (even only as needed), please bring them with you on the day of your procedure. Your physician has requested that you go to www.startemmi.com and enter the access code given to you at your visit today. This web site gives a general overview about your procedure. However, you should still follow specific instructions given to you by our office regarding your preparation for the procedure.  We have sent the following medications to your pharmacy for you to pick up at your convenience: Miralax-Take 2 capfuls (34 grams) daily dissolved in water/juice Anusol suppositories-per rectum twice daily x 10 days  Please make sure to increase your water intake!  Eat plenty of "P" fruits: peaches, pears, prunes, plums, pinapple and pear juice!  CC:Dr Penni Homans

## 2014-07-31 NOTE — Progress Notes (Signed)
Agree w/ Ms. Hvozdovic's note and mangement.  

## 2014-07-31 NOTE — Progress Notes (Signed)
Patient ID: [name], male   DOB: May 24, 1946, 69 y.o.   MRN: [id number]    HPI:    [name] is a pleasant 69 year old male referred for evaluation by Dr. Charlett Blake due to rectal bleeding.  [name] states that for the past several years she will be constipated. He will have days that he passes hard nugget like stools and may go several days without a bowel movement. Recently he was advised to use Mira lax which she says is not providing impressive relief. He has been having blood on the toilet tissue with bowel movements and blood dripping in the commode on a daily basis for the past 2-3 months. Over the past several weeks the bleeding has gotten much have a year. He has no associated abdominal pain. He had a colonoscopy by Dr. Ferdinand Lango in 2012 at which time a tubular adenoma with low-grade dysplasia was removed from the proximal descending colon and a hyperplastic polyp was removed from the rectum he had a repeat colonoscopy on 04/23/2012 which had an inadequate prep with limited view. No follow-up was scheduled. The patient's appetite has been good and his weight has been stable. He does have a sensation of incomplete evacuation. He has no mucus or tenesmus.   Past Medical History  Diagnosis Date  . Vitamin D deficiency   . Back pain 10/10/2012  . Arthritis   . Hyperlipidemia   . Hypertension   . Obesity (BMI 30.0-34.9)   . Allergic state 10/10/2012  . Osteoporosis   . Anemia   . Nocturia 08/21/2013  . Hyperglycemia 08/21/2013  . Unspecified constipation 02/06/2014  . Internal hemorrhoids   . Tubular adenoma of colon   . Rectal bleeding 07/30/2014  . Colon polyps 2013    Past Surgical History  Procedure Laterality Date  . Tonsillectomy  1957  . Fracture surgery Left 1989    leg  . Knee surgery Left 2010    arthroscopy, torn carilage  . Knee surgery Right 2012    arthroscopy   Family History  Problem Relation Age of Onset  . Stroke Mother   . Aneurysm Mother     brain  .  Arthritis Father   . Liver cancer Father     liver, atomic testing in [employer]  . Hyperlipidemia Sister   . Hypertension Sister   . Obesity Sister   . Diabetes Sister   . Diabetes Brother   . Hyperlipidemia Sister   . Hypertension Sister   . Obesity Sister   . Diabetes Sister   . Aneurysm Brother     brain  . COPD Brother   . Colon cancer Neg Hx   . Colon polyps Neg Hx   . Gallbladder disease Sister     x4  . Gallbladder disease Father   . Esophageal cancer Neg Hx    History  Substance Use Topics  . Smoking status: Never Smoker   . Smokeless tobacco: Never Used  . Alcohol Use: 1.2 oz/week    2 Not specified per week     Comment: Occassionally   Current Outpatient Prescriptions  Medication Sig Dispense Refill  . ALPRAZolam (XANAX) 1 MG tablet Take 1 tablet (1 mg total) by mouth daily as needed for sleep or anxiety. 30 tablet 1  . amLODipine-benazepril (LOTREL) 5-20 MG per capsule TAKE 1 CAPSULE DAILY 90 capsule 1  . azelastine (OPTIVAR) 0.05 % ophthalmic solution Place 1 drop into the left eye 2 (two) times daily. 6 mL 1  .  cefdinir (OMNICEF) 300 MG capsule Take 1 capsule (300 mg total) by mouth 2 (two) times daily. 28 capsule 0  . clobetasol cream (TEMOVATE) 6.77 % Apply 1 application topically 2 (two) times daily. 30 g 0  . fluticasone (FLONASE) 50 MCG/ACT nasal spray Place 2 sprays into both nostrils daily. 16 g 1  . HYDROcodone-homatropine (HYCODAN) 5-1.5 MG/5ML syrup Take 5 mLs by mouth every 8 (eight) hours as needed for cough. 120 mL 0  . hydrocortisone (ANUSOL-HC) 25 MG suppository Place 1 suppository (25 mg total) rectally 2 (two) times daily. 20 suppository 0  . ibuprofen (ADVIL,MOTRIN) 400 MG tablet TAKE 1 TABLET (400 MG TOTAL) BY MOUTH EVERY 6 (SIX) HOURS AS NEEDED. 30 tablet 2  . LIVALO 2 MG TABS TAKE 1 TABLET DAILY 90 tablet 0  . metoprolol (LOPRESSOR) 50 MG tablet TAKE 1 TABLET DAILY 90 tablet 1  . MOVIPREP 100 G SOLR Take 1 kit (200 g total) by mouth once. 1  kit 0  . polyethylene glycol powder (GLYCOLAX/MIRALAX) powder Dissolve 2 capfuls (32 grams) in water/juice and drink daily. May titrate as needed 527 g 2   No current facility-administered medications for this visit.   No Known Allergies   Review of Systems: Gen: Denies any fever, chills, sweats, anorexia, fatigue, weakness, malaise, weight loss, and sleep disorder CV: Denies chest pain, angina, palpitations, syncope, orthopnea, PND, peripheral edema, and claudication. Resp: Denies dyspnea at rest, dyspnea with exercise, cough, sputum, wheezing, coughing up blood, and pleurisy. GI: Denies vomiting blood, jaundice, and fecal incontinence.   Denies dysphagia or odynophagia. GU : Denies urinary burning, blood in urine, urinary frequency, urinary hesitancy, nocturnal urination, and urinary incontinence. MS: Denies joint pain, limitation of movement, and swelling, stiffness, low back pain, extremity pain. Denies muscle weakness, cramps, atrophy.  Derm: Denies rash, itching, dry skin, hives, moles, warts, or unhealing ulcers.  Psych: Denies depression, anxiety, memory loss, suicidal ideation, hallucinations, paranoia, and confusion. Heme: Denies bruising, bleeding, and enlarged lymph nodes. Neuro:  Denies any headaches, dizziness, paresthesias. Endo:  Denies any problems with DM, thyroid, adrenal function    Prior Endoscopies:   See history of present illness  Physical Exam: BP 134/82 mmHg  Pulse 76  Ht 5' 9.25" (1.759 m)  Wt 220 lb 6 oz (99.961 kg)  BMI 32.31 kg/m2 Constitutional: Pleasant,well-developedmale in no acute distress. HEENT: Normocephalic and atraumatic. Conjunctivae are normal. No scleral icterus. Neck supple. No thyromegaly Cardiovascular: Normal rate, regular rhythm.  Pulmonary/chest: Effort normal and breath sounds normal. No wheezing, rales or rhonchi. Abdominal: Soft, nondistended, nontender. Bowel sounds active throughout. There are no masses palpable. No  hepatomegaly. Rectal: external skin tags, anoscopy with posterior internal hemorrhoids. Extremities: no edema Lymphadenopathy: No cervical adenopathy noted. Neurological: Alert and oriented to person place and time. Skin: Skin is warm and dry. No rashes noted. Psychiatric: Normal mood and affect. Behavior is normal.  ASSESSMENT AND PLAN: #1 constipation. He has been advised to increase Mira lax to 2 capfuls daily and titrate as needed. He is to increase his water intake, and he will add "P fruits" to his diet.  #2. Internal hemorrhoids. He will be given a trial of Anusol HC suppositories 1 per rectum twice a day for 10 days. If his hemorrhoids are noted to be fairly large on colonoscopy, he may be a candidate for banding and he would like to discuss this with Dr. Carlean Purl at the time. He has had a hemorrhoid banded years ago with Dr. Ferdinand Lango and would  like to know if he is a candidate for additional banding.  #3. Personal history of adenomatous polyps. He is due for surveillance colonoscopy and will be scheduled for a colonoscopy to evaluate for recurrent polyps, neoplasia, or IBD.The risks, benefits, and alternatives to colonoscopy with possible biopsy and possible polypectomy were discussed with the patient and they consent to proceed. The procedure will be scheduled with Dr. Carlean Purl.  Further recommendations will be made pending the findings of his colonoscopy.    Kanton Kamel, Vita Barley PA-C 07/31/2014, 12:07 PM

## 2014-08-07 ENCOUNTER — Ambulatory Visit (INDEPENDENT_AMBULATORY_CARE_PROVIDER_SITE_OTHER): Payer: Medicare Other | Admitting: Family Medicine

## 2014-08-07 ENCOUNTER — Encounter: Payer: Self-pay | Admitting: Family Medicine

## 2014-08-07 VITALS — BP 138/90 | HR 55 | Temp 98.2°F | Ht 70.0 in | Wt 223.1 lb

## 2014-08-07 DIAGNOSIS — Z0000 Encounter for general adult medical examination without abnormal findings: Secondary | ICD-10-CM | POA: Diagnosis not present

## 2014-08-07 DIAGNOSIS — E669 Obesity, unspecified: Secondary | ICD-10-CM | POA: Diagnosis not present

## 2014-08-07 DIAGNOSIS — D649 Anemia, unspecified: Secondary | ICD-10-CM

## 2014-08-07 DIAGNOSIS — R351 Nocturia: Secondary | ICD-10-CM

## 2014-08-07 DIAGNOSIS — I10 Essential (primary) hypertension: Secondary | ICD-10-CM | POA: Diagnosis not present

## 2014-08-07 DIAGNOSIS — M545 Low back pain: Secondary | ICD-10-CM

## 2014-08-07 DIAGNOSIS — E785 Hyperlipidemia, unspecified: Secondary | ICD-10-CM

## 2014-08-07 DIAGNOSIS — Z79899 Other long term (current) drug therapy: Secondary | ICD-10-CM | POA: Diagnosis not present

## 2014-08-07 DIAGNOSIS — K625 Hemorrhage of anus and rectum: Secondary | ICD-10-CM | POA: Diagnosis not present

## 2014-08-07 DIAGNOSIS — R739 Hyperglycemia, unspecified: Secondary | ICD-10-CM

## 2014-08-07 DIAGNOSIS — E782 Mixed hyperlipidemia: Secondary | ICD-10-CM

## 2014-08-07 LAB — CBC
HCT: 34.9 % — ABNORMAL LOW (ref 39.0–52.0)
Hemoglobin: 11.8 g/dL — ABNORMAL LOW (ref 13.0–17.0)
MCHC: 33.8 g/dL (ref 30.0–36.0)
MCV: 83.1 fl (ref 78.0–100.0)
Platelets: 219 10*3/uL (ref 150.0–400.0)
RBC: 4.2 Mil/uL — ABNORMAL LOW (ref 4.22–5.81)
RDW: 13.6 % (ref 11.5–15.5)
WBC: 5.1 10*3/uL (ref 4.0–10.5)

## 2014-08-07 LAB — URINALYSIS
Bilirubin Urine: NEGATIVE
Hgb urine dipstick: NEGATIVE
Ketones, ur: NEGATIVE
Leukocytes, UA: NEGATIVE
Nitrite: NEGATIVE
Specific Gravity, Urine: 1.015 (ref 1.000–1.030)
Total Protein, Urine: NEGATIVE
Urine Glucose: NEGATIVE
Urobilinogen, UA: 0.2 (ref 0.0–1.0)
pH: 6 (ref 5.0–8.0)

## 2014-08-07 LAB — HEMOGLOBIN A1C: Hgb A1c MFr Bld: 6 % (ref 4.6–6.5)

## 2014-08-07 LAB — LIPID PANEL
Cholesterol: 135 mg/dL (ref 0–200)
HDL: 38.4 mg/dL — ABNORMAL LOW (ref >39.00)
LDL Cholesterol: 77 mg/dL (ref 0–99)
NonHDL: 96.6
Total CHOL/HDL Ratio: 4
Triglycerides: 98 mg/dL (ref 0.0–149.0)
VLDL: 19.6 mg/dL (ref 0.0–40.0)

## 2014-08-07 LAB — COMPREHENSIVE METABOLIC PANEL
ALT: 28 U/L (ref 0–53)
AST: 20 U/L (ref 0–37)
Albumin: 4.1 g/dL (ref 3.5–5.2)
Alkaline Phosphatase: 76 U/L (ref 39–117)
BUN: 15 mg/dL (ref 6–23)
CO2: 27 mEq/L (ref 19–32)
Calcium: 9.2 mg/dL (ref 8.4–10.5)
Chloride: 109 mEq/L (ref 96–112)
Creatinine, Ser: 1.19 mg/dL (ref 0.40–1.50)
GFR: 78.55 mL/min (ref >60.00)
Glucose, Bld: 107 mg/dL — ABNORMAL HIGH (ref 70–99)
Potassium: 4.1 mEq/L (ref 3.5–5.1)
Sodium: 140 mEq/L (ref 135–145)
Total Bilirubin: 0.5 mg/dL (ref 0.2–1.2)
Total Protein: 6.6 g/dL (ref 6.0–8.3)

## 2014-08-07 LAB — TSH: TSH: 1.22 u[IU]/mL (ref 0.35–4.50)

## 2014-08-07 LAB — PSA: PSA: 1.08 ng/mL (ref 0.10–4.00)

## 2014-08-07 MED ORDER — TAMSULOSIN HCL 0.4 MG PO CAPS: 0.4000 mg | ORAL_CAPSULE | Freq: Every day | ORAL | Status: DC

## 2014-08-07 NOTE — Patient Instructions (Addendum)
Benign Prostatic Hyperplasia An enlarged prostate (benign prostatic hyperplasia) is common in older men. You may experience the following:  Weak urine stream.  Dribbling.  Feeling like the bladder has not emptied completely.  Difficulty starting urination.  Getting up frequently at night to urinate.  Urinating more frequently during the day. HOME CARE INSTRUCTIONS  Monitor your prostatic hyperplasia for any changes. The following actions may help to alleviate any discomfort you are experiencing:  Give yourself time when you urinate.  Stay away from alcohol.  Avoid beverages containing caffeine, such as coffee, tea, and colas, because they can make the problem worse.  Avoid decongestants, antihistamines, and some prescription medicines that can make the problem worse.  Follow up with your health care provider for further treatment as recommended. SEEK MEDICAL CARE IF:  You are experiencing progressive difficulty voiding.  Your urine stream is progressively getting narrower.  You are awaking from sleep with the urge to void more frequently.  You are constantly feeling the need to void.  You experience loss of urine, especially in small amounts. SEEK IMMEDIATE MEDICAL CARE IF:   You develop increased pain with urination or are unable to urinate.  You develop severe abdominal pain, vomiting, a high fever, or fainting.  You develop back pain or blood in your urine. MAKE SURE YOU:   Understand these instructions.  Will watch your condition.  Will get help right away if you are not doing well or get worse. Document Released: 06/16/2005 Document Revised: 02/16/2013 Document Reviewed: 11/16/2012 [facility] Patient Information 2015 [place], [place]. This information is not intended to replace advice given to you by your health care provider. Make sure you discuss any questions you have with your health care provider.   Encouraged increased rest and hydration 64 oz daily,  continue probiotics. Mucinex twice daily x 2 weeks. Flonase daily, nasal saline twice daily

## 2014-08-07 NOTE — Progress Notes (Signed)
Pre visit review using our clinic review tool, if applicable. No additional management support is needed unless otherwise documented below in the visit note. 

## 2014-08-08 ENCOUNTER — Other Ambulatory Visit: Payer: Self-pay | Admitting: Family Medicine

## 2014-08-08 LAB — URINE CULTURE
Colony Count: NO GROWTH
Organism ID, Bacteria: NO GROWTH

## 2014-08-08 MED ORDER — FERROUS FUMARATE-FOLIC ACID 324-1 MG PO TABS: ORAL_TABLET | ORAL | Status: DC

## 2014-08-14 ENCOUNTER — Encounter: Payer: Self-pay | Admitting: Family Medicine

## 2014-08-14 DIAGNOSIS — D649 Anemia, unspecified: Secondary | ICD-10-CM

## 2014-08-14 HISTORY — DX: Anemia, unspecified: D64.9

## 2014-08-14 NOTE — Progress Notes (Signed)
[name]  [id number] 1946-05-17 08/14/2014      Progress Note-Follow Up  Subjective  Chief Complaint  No chief complaint on file.   HPI  Patient is a 69 y.o. male in today for routine medical care. Patient in today for follow-up. Continues to see blood intermittently in his stool. It is bright red and does not fill the bowl or continue between movements. He's been seen by gastroenterology and has been started on steroid suppositories and he does think they are helping. He's having no pain at this time. Has mild constipation but it is improving. No abdominal pain or fevers. No other acute complaints at this time. Denies CP/palp/SOB/HA/congestion/fevers/GI or GU c/o. Taking meds as prescribed  Past Medical History  Diagnosis Date  . Vitamin D deficiency   . Back pain 10/10/2012  . Arthritis   . Hyperlipidemia   . Hypertension   . Obesity (BMI 30.0-34.9)   . Allergic state 10/10/2012  . Osteoporosis   . Anemia   . Nocturia 08/21/2013  . Hyperglycemia 08/21/2013  . Unspecified constipation 02/06/2014  . Internal hemorrhoids   . Tubular adenoma of colon   . Rectal bleeding 07/30/2014  . Colon polyps 2013  . Anemia 08/14/2014    Past Surgical History  Procedure Laterality Date  . Tonsillectomy  1957  . Fracture surgery Left 1989    leg  . Knee surgery Left 2010    arthroscopy, torn carilage  . Knee surgery Right 2012    arthroscopy    Family History  Problem Relation Age of Onset  . Stroke Mother   . Aneurysm Mother     brain  . Arthritis Father   . Liver cancer Father     liver, atomic testing in [employer]  . Hyperlipidemia Sister   . Hypertension Sister   . Obesity Sister   . Diabetes Sister   . Diabetes Brother   . Hyperlipidemia Sister   . Hypertension Sister   . Obesity Sister   . Diabetes Sister   . Aneurysm Brother     brain  . COPD Brother   . Colon cancer Neg Hx   . Colon polyps Neg Hx   . Gallbladder disease Sister     x4  . Gallbladder  disease Father   . Esophageal cancer Neg Hx     History   Social History  . Marital Status: Married    Spouse Name: N/A  . Number of Children: 1  . Years of Education: N/A   Occupational History  . Retired [employer]   Social History Main Topics  . Smoking status: Never Smoker   . Smokeless tobacco: Never Used  . Alcohol Use: 1.2 oz/week    2 Standard drinks or equivalent per week     Comment: Occassionally  . Drug Use: No  . Sexual Activity: No   Other Topics Concern  . Not on file   Social History Narrative    Current Outpatient Prescriptions on File Prior to Visit  Medication Sig Dispense Refill  . ALPRAZolam (XANAX) 1 MG tablet Take 1 tablet (1 mg total) by mouth daily as needed for sleep or anxiety. 30 tablet 1  . amLODipine-benazepril (LOTREL) 5-20 MG per capsule TAKE 1 CAPSULE DAILY 90 capsule 1  . azelastine (OPTIVAR) 0.05 % ophthalmic solution Place 1 drop into the left eye 2 (two) times daily. 6 mL 1  . clobetasol cream (TEMOVATE) 1.15 % Apply 1 application topically 2 (two) times daily. 30 g  0  . fluticasone (FLONASE) 50 MCG/ACT nasal spray Place 2 sprays into both nostrils daily. 16 g 1  . hydrocortisone (ANUSOL-HC) 25 MG suppository Place 1 suppository (25 mg total) rectally 2 (two) times daily. 20 suppository 0  . ibuprofen (ADVIL,MOTRIN) 400 MG tablet TAKE 1 TABLET (400 MG TOTAL) BY MOUTH EVERY 6 (SIX) HOURS AS NEEDED. 30 tablet 2  . LIVALO 2 MG TABS TAKE 1 TABLET DAILY 90 tablet 0  . metoprolol (LOPRESSOR) 50 MG tablet TAKE 1 TABLET DAILY 90 tablet 1  . MOVIPREP 100 G SOLR Take 1 kit (200 g total) by mouth once. 1 kit 0  . polyethylene glycol powder (GLYCOLAX/MIRALAX) powder Dissolve 2 capfuls (32 grams) in water/juice and drink daily. May titrate as needed 527 g 2   No current facility-administered medications on file prior to visit.    No Known Allergies  Review of Systems  Review of Systems  Constitutional: Negative for fever and  malaise/fatigue.  HENT: Negative for congestion.   Eyes: Negative for discharge.  Respiratory: Negative for shortness of breath.   Cardiovascular: Negative for chest pain, palpitations and leg swelling.  Gastrointestinal: Positive for blood in stool. Negative for nausea, abdominal pain, diarrhea, constipation and melena.       Notes a small amount of blood noted on tissue intermittently  Genitourinary: Negative for dysuria.  Musculoskeletal: Negative for falls.  Skin: Negative for rash.  Neurological: Negative for loss of consciousness and headaches.  Endo/Heme/Allergies: Negative for polydipsia.  Psychiatric/Behavioral: Negative for depression and suicidal ideas. The patient is not nervous/anxious and does not have insomnia.     Objective  BP 138/90 mmHg  Pulse 55  Temp(Src) 98.2 F (36.8 C) (Oral)  Ht '5\' 10"'  (1.778 m)  Wt 223 lb 2 oz (101.209 kg)  BMI 32.02 kg/m2  SpO2 100%  Physical Exam  Physical Exam  Constitutional: He is oriented to person, place, and time and well-developed, well-nourished, and in no distress. No distress.  HENT:  Head: Normocephalic and atraumatic.  Eyes: Conjunctivae are normal.  Neck: Neck supple. No thyromegaly present.  Cardiovascular: Normal rate, regular rhythm and normal heart sounds.   No murmur heard. Pulmonary/Chest: Effort normal and breath sounds normal. No respiratory distress.  Abdominal: He exhibits no distension and no mass. There is no tenderness.  Musculoskeletal: He exhibits no edema.  Neurological: He is alert and oriented to person, place, and time.  Skin: Skin is warm.  Psychiatric: Memory, affect and judgment normal.    Lab Results  Component Value Date   TSH 1.22 08/07/2014   Lab Results  Component Value Date   WBC 5.1 08/07/2014   HGB 11.8* 08/07/2014   HCT 34.9* 08/07/2014   MCV 83.1 08/07/2014   PLT 219.0 08/07/2014   Lab Results  Component Value Date   CREATININE 1.19 08/07/2014   BUN 15 08/07/2014   NA  140 08/07/2014   K 4.1 08/07/2014   CL 109 08/07/2014   CO2 27 08/07/2014   Lab Results  Component Value Date   ALT 28 08/07/2014   AST 20 08/07/2014   ALKPHOS 76 08/07/2014   BILITOT 0.5 08/07/2014   Lab Results  Component Value Date   CHOL 135 08/07/2014   Lab Results  Component Value Date   HDL 38.40* 08/07/2014   Lab Results  Component Value Date   LDLCALC 77 08/07/2014   Lab Results  Component Value Date   TRIG 98.0 08/07/2014   Lab Results  Component Value Date  CHOLHDL 4 08/07/2014     Assessment & Plan  Hypertension Well controlled, no changes to meds. Encouraged heart healthy diet such as the DASH diet and exercise as tolerated. Improved on recheck   Hyperlipidemia Tolerating statin, encouraged heart healthy diet, avoid trans fats, minimize simple carbs and saturated fats. Increase exercise as tolerated   Anemia Will need FeSO4 if no improvement then will need further work up   Hyperglycemia hgba1c acceptable, minimize simple carbs. Increase exercise as tolerated. Continue current meds   Obesity (BMI 30.0-34.9) Encouraged DASH diet, decrease po intake and increase exercise as tolerated. Needs 7-8 hours of sleep nightly. Avoid trans fats, eat small, frequent meals every 4-5 hours with lean proteins, complex carbs and healthy fats. Minimize simple carbs   Rectal bleeding No large event but recurrent episodes. Has been seen by gastroenterology he will seek care if he has bleeding that increases. Will proceed with steroid suppositories and encouraged to add probiotics, fiber and fluids.

## 2014-08-14 NOTE — Assessment & Plan Note (Signed)
hgba1c acceptable, minimize simple carbs. Increase exercise as tolerated. Continue current meds 

## 2014-08-14 NOTE — Assessment & Plan Note (Signed)
No large event but recurrent episodes. Has been seen by gastroenterology he will seek care if he has bleeding that increases. Will proceed with steroid suppositories and encouraged to add probiotics, fiber and fluids.

## 2014-08-14 NOTE — Assessment & Plan Note (Signed)
Well controlled, no changes to meds. Encouraged heart healthy diet such as the DASH diet and exercise as tolerated. Improved on recheck 

## 2014-08-14 NOTE — Assessment & Plan Note (Signed)
Encouraged DASH diet, decrease po intake and increase exercise as tolerated. Needs 7-8 hours of sleep nightly. Avoid trans fats, eat small, frequent meals every 4-5 hours with lean proteins, complex carbs and healthy fats. Minimize simple carbs 

## 2014-08-14 NOTE — Assessment & Plan Note (Signed)
Will need FeSO4 if no improvement then will need further work up

## 2014-08-14 NOTE — Assessment & Plan Note (Signed)
Tolerating statin, encouraged heart healthy diet, avoid trans fats, minimize simple carbs and saturated fats. Increase exercise as tolerated 

## 2014-08-25 ENCOUNTER — Encounter: Payer: Self-pay | Admitting: Internal Medicine

## 2014-08-25 ENCOUNTER — Ambulatory Visit (AMBULATORY_SURGERY_CENTER): Payer: Medicare Other | Admitting: Internal Medicine

## 2014-08-25 VITALS — BP 131/85 | HR 67 | Temp 97.8°F | Resp 23 | Ht 69.25 in | Wt 220.0 lb

## 2014-08-25 DIAGNOSIS — Z86010 Personal history of colonic polyps: Secondary | ICD-10-CM | POA: Diagnosis not present

## 2014-08-25 DIAGNOSIS — K625 Hemorrhage of anus and rectum: Secondary | ICD-10-CM

## 2014-08-25 DIAGNOSIS — D122 Benign neoplasm of ascending colon: Secondary | ICD-10-CM

## 2014-08-25 DIAGNOSIS — M545 Low back pain: Secondary | ICD-10-CM | POA: Diagnosis not present

## 2014-08-25 DIAGNOSIS — I10 Essential (primary) hypertension: Secondary | ICD-10-CM | POA: Diagnosis not present

## 2014-08-25 DIAGNOSIS — D124 Benign neoplasm of descending colon: Secondary | ICD-10-CM

## 2014-08-25 MED ORDER — SODIUM CHLORIDE 0.9 % IV SOLN: 500.0000 mL | INTRAVENOUS | Status: DC

## 2014-08-25 NOTE — Op Note (Signed)
[place]  [address] [place], [address]   COLONOSCOPY PROCEDURE REPORT  PATIENT: [name], [name]  MR#: [id number] BIRTHDATE: 1945-09-23 , 23  yrs. old GENDER: male ENDOSCOPIST: Gatha Mayer, MD, [facility] PROCEDURE DATE:  08/25/2014 PROCEDURE:   Colonoscopy with biopsy and Colonoscopy with snare polypectomy First Screening Colonoscopy - Avg.  risk and is 50 yrs.  old or older - No.  Prior Negative Screening - Now for repeat screening. N/A  History of Adenoma - Now for follow-up colonoscopy & has been > or = to 3 yrs.  No.  It has been less than 3 yrs since last colonoscopy.  Medical reason.  Polyps Removed Today? Yes. ASA CLASS:   Class II INDICATIONS:history of adenomas and rectal bleeding. MEDICATIONS: Propofol 200 mg IV and Monitored anesthesia care  DESCRIPTION OF PROCEDURE:   After the risks benefits and alternatives of the procedure were thoroughly explained, informed consent was obtained.  The digital rectal exam revealed no abnormalities of the rectum, revealed no prostatic nodules, and revealed the prostate was not enlarged.   The [id number] endoscope was introduced through the anus and advanced to the cecum, which was identified by both the appendix and ileocecal valve. No adverse events experienced.   The quality of the prep was excellent, using MiraLax and Moviprep (double prep) excellent.  The instrument was then slowly withdrawn as the colon was fully examined.  COLON FINDINGS: 1) 1-2 mm ascending polyp removed with cold forceps and sent to pathology. 2) 4 mm descending polyp removed with cold snare and sent to pathology 3) sigmoid diverticulosis 4) internal hemorrhoids and external hemorrhoids with banding scars in right anterior rectum 5) otherwise normal colonoscopy with excellent (double) prep.  Retroflexed views revealed internal hemorrhoids and Retroflexed views revealed external hemorrhoids. The time to cecum=3 minutes 53  seconds.  Withdrawal time=9 minutes 38 seconds. The scope was withdrawn and the procedure completed. COMPLICATIONS: There were no immediate complications.  ENDOSCOPIC IMPRESSION: 1) 1-2 mm ascending polyp removed with cold forceps and sent to pathology. 2) 4 mm descending polyp removed with cold snare and sent to pathology 3) sigmoid diverticulosis 4) internal hemorrhoids and external hemorrhoids with banding scars in right anterior rectum 5) otherwise normal colonoscopy with excellent (double) prep  RECOMMENDATIONS: 1.  Timing of repeat colonoscopy will be determined by pathology findings. 2.  My office will contact patient re: banding appointment Continue fiber supplements  eSigned:  Gatha Mayer, MD, [facility] 08/25/2014 4:07 PM   cc: Penni Homans, MDand The Patient

## 2014-08-25 NOTE — Progress Notes (Signed)
Called to room to assist during endoscopic procedure.  Patient ID and intended procedure confirmed with present staff. Received instructions for my participation in the procedure from the performing physician.  

## 2014-08-25 NOTE — Patient Instructions (Addendum)
I found and removed two small polyps that look benign. I will have my staff contact you about banding the hemorrhoids.  I will let you know pathology results and when to have another routine colonoscopy by mail.  I appreciate the opportunity to care for you. Gatha Mayer, MD, [facility]  FOLLOW DISCHARGE INSTRUCTIONS [facility]).YOU HAD AN ENDOSCOPIC PROCEDURE TODAY AT [facility]:   Refer to the procedure report that was given to you for any specific questions about what was found during the examination.  If the procedure report does not answer your questions, please call your gastroenterologist to clarify.  If you requested that your care partner not be given the details of your procedure findings, then the procedure report has been included in a sealed envelope for you to review at your convenience later.  YOU SHOULD EXPECT: Some feelings of bloating in the abdomen. Passage of more gas than usual.  Walking can help get rid of the air that was put into your GI tract during the procedure and reduce the bloating. If you had a lower endoscopy (such as a colonoscopy or flexible sigmoidoscopy) you may notice spotting of blood in your stool or on the toilet paper. If you underwent a bowel prep for your procedure, you may not have a normal bowel movement for a few days.  Please Note:  You might notice some irritation and congestion in your nose or some drainage.  This is from the oxygen used during your procedure.  There is no need for concern and it should clear up in a day or so.  SYMPTOMS TO REPORT IMMEDIATELY:   Following lower endoscopy (colonoscopy or flexible sigmoidoscopy):  Excessive amounts of blood in the stool  Significant tenderness or worsening of abdominal pains  Swelling of the abdomen that is new, acute  Fever of 100F or higher    A gastroenterologist can be reached at any hour by calling [phone].   DIET: Your first meal following the  procedure should be a small meal and then it is ok to progress to your normal diet. Heavy or fried foods are harder to digest and may make you feel nauseous or bloated.  Likewise, meals heavy in dairy and vegetables can increase bloating.  Drink plenty of fluids but you should avoid alcoholic beverages for 24 hours.  ACTIVITY:  You should plan to take it easy for the rest of today and you should NOT DRIVE or use heavy machinery until tomorrow (because of the sedation medicines used during the test).     FOLLOW UP: Our staff will call the number listed on your records the next business day following your procedure to check on you and address any questions or concerns that you may have regarding the information given to you following your procedure. If we do not reach you, we will leave a message.  However, if you are feeling well and you are not experiencing any problems, there is no need to return our call.  We will assume that you have returned to your regular daily activities without incident.  If any biopsies were taken you will be contacted by phone or by letter within the next 1-3 weeks.  Please call your gastroenterologist at [phone] if you have not heard about the biopsies in 3 weeks.    SIGNATURES/CONFIDENTIALITY: You and/or your care partner have signed paperwork which will be entered into your electronic medical record.  These signatures attest to the  fact that that the information above on your After Visit Summary has been reviewed and is understood.  Full responsibility of the confidentiality of this discharge information lies with you and/or your care-partner.  Hemorrhoid banding, diverticulosis, and polyp information given.

## 2014-08-25 NOTE — Progress Notes (Signed)
Report to PACU, RN, vss, BBS= Clear.  

## 2014-08-28 ENCOUNTER — Telehealth: Payer: Self-pay | Admitting: *Deleted

## 2014-08-28 ENCOUNTER — Telehealth: Payer: Self-pay

## 2014-08-28 NOTE — Telephone Encounter (Signed)
appt scheduled with the patient for banding #1 on 09/28/14 2:00

## 2014-08-28 NOTE — Telephone Encounter (Signed)
-----   Message from Gatha Mayer, MD sent at 08/25/2014  4:08 PM EST ----- Regarding: banding appt He needs a banding appt not urgent

## 2014-08-28 NOTE — Telephone Encounter (Signed)
  Follow up Call-  Call back number 08/25/2014  Post procedure Call Back phone  # [phone]  Permission to leave phone message Yes     Patient questions:  Do you have a fever, pain , or abdominal swelling? No. Pain Score  0 *  Have you tolerated food without any problems? Yes.    Have you been able to return to your normal activities? Yes.    Do you have any questions about your discharge instructions: Diet   No. Medications  No. Follow up visit  No.  Do you have questions or concerns about your Care? No.  Actions: * If pain score is 4 or above: No action needed, pain <4.

## 2014-08-30 ENCOUNTER — Encounter: Payer: Self-pay | Admitting: Internal Medicine

## 2014-08-30 DIAGNOSIS — Z86010 Personal history of colonic polyps: Secondary | ICD-10-CM

## 2014-08-30 NOTE — Progress Notes (Signed)
Quick Note:  Diminutive adenoma - repeat colon 2021 ______

## 2014-09-05 DIAGNOSIS — N401 Enlarged prostate with lower urinary tract symptoms: Secondary | ICD-10-CM | POA: Diagnosis not present

## 2014-09-05 DIAGNOSIS — R351 Nocturia: Secondary | ICD-10-CM | POA: Diagnosis not present

## 2014-09-05 DIAGNOSIS — N138 Other obstructive and reflux uropathy: Secondary | ICD-10-CM | POA: Diagnosis not present

## 2014-09-11 ENCOUNTER — Other Ambulatory Visit: Payer: Self-pay | Admitting: Family Medicine

## 2014-09-13 ENCOUNTER — Other Ambulatory Visit: Payer: Self-pay | Admitting: Family Medicine

## 2014-09-13 NOTE — Telephone Encounter (Signed)
Amlodipine-benazepril Last filled:  04/24/14 Amt: 90, 1  Metoprolol   Last filled: 04/24/14 Amt: 90, 1   Last OV: 08/07/14  Med filled.

## 2014-09-28 ENCOUNTER — Ambulatory Visit (INDEPENDENT_AMBULATORY_CARE_PROVIDER_SITE_OTHER): Payer: Medicare Other | Admitting: Internal Medicine

## 2014-09-28 ENCOUNTER — Encounter: Payer: Self-pay | Admitting: Internal Medicine

## 2014-09-28 VITALS — BP 128/82 | HR 68 | Ht 69.25 in | Wt 219.4 lb

## 2014-09-28 DIAGNOSIS — K648 Other hemorrhoids: Secondary | ICD-10-CM | POA: Diagnosis not present

## 2014-09-28 NOTE — Progress Notes (Signed)
Patient ID: [name], male   DOB: 1945-11-25, 69 y.o.   MRN: [id number]          Rectal shows chronic anal tags all 3 positions, long canal with some spasm.  Anoscopy was performed with the patient in the left lateral decubitus position while a chaperone was present and revealed Grade 2 internal hemorrhoids in the RP, LL positions and RA positions.  PROCEDURE NOTE: The patient presents with symptomatic grade 2  hemorrhoids, requesting rubber band ligation of his/her hemorrhoidal disease.  All risks, benefits and alternative forms of therapy were described and informed consent was obtained.   The decision was made to band the RP internal hemorrhoid, and the [place] was used to perform band ligation without complication.  Digital anorectal examination was then performed to assure proper positioning of the band, and to adjust the banded tissue as required.  The patient was discharged home without pain or other issues.  Dietary and behavioral recommendations were given and along with follow-up instructions.     The following adjunctive treatments were recommended:  Continue MiraLAx  The patient will return in 2 weeks for  follow-up and possible additional banding as required. No complications were encountered and the patient tolerated the procedure well.  I appreciate the opportunity to care for this patient. Penni Homans, MD

## 2014-09-28 NOTE — Patient Instructions (Addendum)
HEMORRHOID BANDING PROCEDURE    FOLLOW-UP CARE   1. The procedure you have had should have been relatively painless since the banding of the area involved does not have nerve endings and there is no pain sensation.  The rubber band cuts off the blood supply to the hemorrhoid and the band may fall off as soon as 48 hours after the banding (the band may occasionally be seen in the toilet bowl following a bowel movement). You may notice a temporary feeling of fullness in the rectum which should respond adequately to plain Tylenol or Motrin.  2. Following the banding, avoid strenuous exercise that evening and resume full activity the next day.  A sitz bath (soaking in a warm tub) or bidet is soothing, and can be useful for cleansing the area after bowel movements.     3. To avoid constipation, take two tablespoons of natural wheat bran, natural oat bran, flax, Benefiber or any over the counter fiber supplement and increase your water intake to 7-8 glasses daily.    4. Unless you have been prescribed anorectal medication, do not put anything inside your rectum for two weeks: No suppositories, enemas, fingers, etc.  5. Occasionally, you may have more bleeding than usual after the banding procedure.  This is often from the untreated hemorrhoids rather than the treated one.  Don't be concerned if there is a tablespoon or so of blood.  If there is more blood than this, lie flat with your bottom higher than your head and apply an ice pack to the area. If the bleeding does not stop within a half an hour or if you feel faint, call our office at [phone] or go to the emergency room.  6. Problems are not common; however, if there is a substantial amount of bleeding, severe pain, chills, fever or difficulty passing urine (very rare) or other problems, you should call us at [phone] or report to the nearest emergency room.  7. Do not stay seated continuously for more than 2-3 hours for a day or two  after the procedure.  Tighten your buttock muscles 10-15 times every two hours and take 10-15 deep breaths every 1-2 hours.  Do not spend more than a few minutes on the toilet if you cannot empty your bowel; instead re-visit the toilet at a later time.    Continue Miralax.   We will see you at your next visit April 13th at 3:15pm.   I appreciate the opportunity to care for you.

## 2014-09-29 ENCOUNTER — Encounter: Payer: Self-pay | Admitting: Internal Medicine

## 2014-10-11 ENCOUNTER — Encounter: Payer: Self-pay | Admitting: Internal Medicine

## 2014-10-11 ENCOUNTER — Ambulatory Visit (INDEPENDENT_AMBULATORY_CARE_PROVIDER_SITE_OTHER): Payer: Medicare Other | Admitting: Internal Medicine

## 2014-10-11 VITALS — BP 126/66 | HR 68 | Ht 69.25 in | Wt 217.2 lb

## 2014-10-11 DIAGNOSIS — K648 Other hemorrhoids: Secondary | ICD-10-CM | POA: Diagnosis not present

## 2014-10-11 NOTE — Assessment & Plan Note (Signed)
LL and RA banded RTC prn Continue MiraLax

## 2014-10-11 NOTE — Progress Notes (Signed)
Patient ID: [name], male   DOB: 1945-09-15, 69 y.o.   MRN: [id number]        PROCEDURE NOTE: The patient presents with symptomatic grade  hemorrhoids, requesting rubber band ligation of his/her hemorrhoidal disease.  All risks, benefits and alternative forms of therapy were described and informed consent was obtained.   The anorectum was pre-medicated with 0.125% NTG and 5% lidocaine   The decision was made to band the RA and LL internal hemorrhoids, and the [place] was used to perform band ligation without complication.  Digital anorectal examination was then performed to assure proper positioning of the band, and to adjust the banded tissue as required.  The patient was discharged home without pain or other issues.  Dietary and behavioral recommendations were given and along with follow-up instructions.     The following adjunctive treatments were recommended:  MiraLax  The patient will return prn for  follow-up and possible additional banding as required. No complications were encountered and the patient tolerated the procedure well.

## 2014-10-11 NOTE — Patient Instructions (Addendum)
HEMORRHOID BANDING PROCEDURE    FOLLOW-UP CARE   1. The procedure you have had should have been relatively painless since the banding of the area involved does not have nerve endings and there is no pain sensation.  The rubber band cuts off the blood supply to the hemorrhoid and the band may fall off as soon as 48 hours after the banding (the band may occasionally be seen in the toilet bowl following a bowel movement). You may notice a temporary feeling of fullness in the rectum which should respond adequately to plain Tylenol or Motrin.  2. Following the banding, avoid strenuous exercise that evening and resume full activity the next day.  A sitz bath (soaking in a warm tub) or bidet is soothing, and can be useful for cleansing the area after bowel movements.     3. To avoid constipation, take two tablespoons of natural wheat bran, natural oat bran, flax, Benefiber or any over the counter fiber supplement and increase your water intake to 7-8 glasses daily.    4. Unless you have been prescribed anorectal medication, do not put anything inside your rectum for two weeks: No suppositories, enemas, fingers, etc.  5. Occasionally, you may have more bleeding than usual after the banding procedure.  This is often from the untreated hemorrhoids rather than the treated one.  Don't be concerned if there is a tablespoon or so of blood.  If there is more blood than this, lie flat with your bottom higher than your head and apply an ice pack to the area. If the bleeding does not stop within a half an hour or if you feel faint, call our office at [phone] or go to the emergency room.  6. Problems are not common; however, if there is a substantial amount of bleeding, severe pain, chills, fever or difficulty passing urine (very rare) or other problems, you should call us at [phone] or report to the nearest emergency room.  7. Do not stay seated continuously for more than 2-3 hours for a day or two  after the procedure.  Tighten your buttock muscles 10-15 times every two hours and take 10-15 deep breaths every 1-2 hours.  Do not spend more than a few minutes on the toilet if you cannot empty your bowel; instead re-visit the toilet at a later time.    Continue your Miralax to treat constipation.   Follow up with Dr. Carlean Purl as needed.    I appreciate the opportunity to care for you.

## 2014-12-08 ENCOUNTER — Other Ambulatory Visit: Payer: Self-pay | Admitting: Family Medicine

## 2015-02-09 DIAGNOSIS — R351 Nocturia: Secondary | ICD-10-CM | POA: Diagnosis not present

## 2015-02-09 DIAGNOSIS — N401 Enlarged prostate with lower urinary tract symptoms: Secondary | ICD-10-CM | POA: Diagnosis not present

## 2015-03-08 ENCOUNTER — Ambulatory Visit (INDEPENDENT_AMBULATORY_CARE_PROVIDER_SITE_OTHER): Payer: Medicare Other | Admitting: Family Medicine

## 2015-03-08 ENCOUNTER — Encounter: Payer: Self-pay | Admitting: Family Medicine

## 2015-03-08 VITALS — BP 120/78 | HR 81 | Temp 98.0°F | Resp 16 | Wt 214.1 lb

## 2015-03-08 DIAGNOSIS — R21 Rash and other nonspecific skin eruption: Secondary | ICD-10-CM | POA: Diagnosis not present

## 2015-03-08 MED ORDER — TRIAMCINOLONE ACETONIDE 0.1 % EX OINT: 1.0000 "application " | TOPICAL_OINTMENT | Freq: Two times a day (BID) | CUTANEOUS | Status: DC

## 2015-03-08 NOTE — Patient Instructions (Signed)
Follow up as needed Apply the Triamcinolone ointment twice daily on the arm Take nightly Zyrtec or Claritin (whichever is cheaper) to help w/ the itching and the inflammatory process Call with any questions or concerns- particularly if not improving Hang in there!!!

## 2015-03-08 NOTE — Progress Notes (Signed)
   Subjective:    Patient ID: [name], male    DOB: [birth year], 69 y.o.   MRN: [id number]  HPI Rash on L forearm- 1st appeared 2 weeks ago.  Very itchy.  Improves w/ OTC anti-itch cream.  Also used Neosporin.  sxs started after a yellow jacket sting.  No pain.  Mild swelling.  No fever.  No drainage or oozing.   Review of Systems For ROS see HPI     Objective:   Physical Exam  Constitutional: He is oriented to person, place, and time. He appears well-developed and well-nourished. No distress.  Cardiovascular: Intact distal pulses.   Musculoskeletal: He exhibits edema (mild edema of L ventral forearm). He exhibits no tenderness.  Neurological: He is alert and oriented to person, place, and time.  Skin: Skin is warm and dry. Rash (faint maculopapular rash on L ventral forearm) noted. There is erythema (faint erythema surrounding yellow jacket sting site on L ventral forearm.  no appreciable warmth).  Vitals reviewed.         Assessment & Plan:

## 2015-03-08 NOTE — Assessment & Plan Note (Signed)
New.  No evidence of superimposed infxn.  Suspect this is a local reaction to yellow jacket sting.  No need for abx.  Due to itching and localized response will start topical steroid ointment.  Reviewed supportive care and red flags that should prompt return.  Pt expressed understanding and is in agreement w/ plan.

## 2015-03-08 NOTE — Progress Notes (Signed)
Pre visit review using our clinic review tool, if applicable. No additional management support is needed unless otherwise documented below in the visit note. 

## 2015-03-11 ENCOUNTER — Other Ambulatory Visit: Payer: Self-pay | Admitting: Family Medicine

## 2015-04-11 ENCOUNTER — Telehealth: Payer: Self-pay | Admitting: Family Medicine

## 2015-04-11 NOTE — Telephone Encounter (Signed)
Dr. Charlett Blake did review the request.  Please scheduled

## 2015-04-11 NOTE — Telephone Encounter (Signed)
Pt scheduled appt 10/13 3:15pm for flu and pneumonia shot. Please get MD approval.

## 2015-04-11 NOTE — Telephone Encounter (Signed)
Yes please have him get regular flu shot unless he requests high dose (in which case he can have) and the Pneumovax (PCV 23) shot

## 2015-04-12 ENCOUNTER — Ambulatory Visit: Payer: Medicare Other

## 2015-04-12 DIAGNOSIS — Z23 Encounter for immunization: Secondary | ICD-10-CM

## 2015-04-13 ENCOUNTER — Other Ambulatory Visit: Payer: Self-pay | Admitting: Family Medicine

## 2015-06-04 ENCOUNTER — Inpatient Hospital Stay (HOSPITAL_BASED_OUTPATIENT_CLINIC_OR_DEPARTMENT_OTHER)
Admission: EM | Admit: 2015-06-04 | Discharge: 2015-06-13 | DRG: 390 | Disposition: A | Discharge status: Home / Self Care | Payer: Medicare Other | Attending: Internal Medicine | Admitting: Internal Medicine

## 2015-06-04 ENCOUNTER — Encounter (HOSPITAL_BASED_OUTPATIENT_CLINIC_OR_DEPARTMENT_OTHER): Payer: Self-pay | Admitting: *Deleted

## 2015-06-04 ENCOUNTER — Emergency Department (HOSPITAL_BASED_OUTPATIENT_CLINIC_OR_DEPARTMENT_OTHER): Payer: Medicare Other

## 2015-06-04 ENCOUNTER — Other Ambulatory Visit: Payer: Self-pay | Admitting: Family Medicine

## 2015-06-04 DIAGNOSIS — M1990 Unspecified osteoarthritis, unspecified site: Secondary | ICD-10-CM | POA: Diagnosis present

## 2015-06-04 DIAGNOSIS — F419 Anxiety disorder, unspecified: Secondary | ICD-10-CM | POA: Diagnosis present

## 2015-06-04 DIAGNOSIS — I491 Atrial premature depolarization: Secondary | ICD-10-CM | POA: Diagnosis present

## 2015-06-04 DIAGNOSIS — Z79899 Other long term (current) drug therapy: Secondary | ICD-10-CM

## 2015-06-04 DIAGNOSIS — K529 Noninfective gastroenteritis and colitis, unspecified: Secondary | ICD-10-CM

## 2015-06-04 DIAGNOSIS — I10 Essential (primary) hypertension: Secondary | ICD-10-CM | POA: Diagnosis present

## 2015-06-04 DIAGNOSIS — E876 Hypokalemia: Secondary | ICD-10-CM | POA: Diagnosis present

## 2015-06-04 DIAGNOSIS — K5660 Unspecified intestinal obstruction: Principal | ICD-10-CM | POA: Diagnosis present

## 2015-06-04 DIAGNOSIS — R195 Other fecal abnormalities: Secondary | ICD-10-CM | POA: Diagnosis present

## 2015-06-04 DIAGNOSIS — N400 Enlarged prostate without lower urinary tract symptoms: Secondary | ICD-10-CM | POA: Diagnosis present

## 2015-06-04 DIAGNOSIS — Z6830 Body mass index (BMI) 30.0-30.9, adult: Secondary | ICD-10-CM

## 2015-06-04 DIAGNOSIS — N200 Calculus of kidney: Secondary | ICD-10-CM | POA: Diagnosis not present

## 2015-06-04 DIAGNOSIS — R197 Diarrhea, unspecified: Secondary | ICD-10-CM | POA: Diagnosis present

## 2015-06-04 DIAGNOSIS — K567 Ileus, unspecified: Secondary | ICD-10-CM | POA: Diagnosis present

## 2015-06-04 DIAGNOSIS — E785 Hyperlipidemia, unspecified: Secondary | ICD-10-CM | POA: Diagnosis present

## 2015-06-04 DIAGNOSIS — Z86010 Personal history of colonic polyps: Secondary | ICD-10-CM

## 2015-06-04 DIAGNOSIS — R933 Abnormal findings on diagnostic imaging of other parts of digestive tract: Secondary | ICD-10-CM | POA: Insufficient documentation

## 2015-06-04 DIAGNOSIS — E669 Obesity, unspecified: Secondary | ICD-10-CM | POA: Diagnosis present

## 2015-06-04 DIAGNOSIS — K56609 Unspecified intestinal obstruction, unspecified as to partial versus complete obstruction: Secondary | ICD-10-CM

## 2015-06-04 DIAGNOSIS — E7800 Pure hypercholesterolemia, unspecified: Secondary | ICD-10-CM | POA: Diagnosis present

## 2015-06-04 DIAGNOSIS — D649 Anemia, unspecified: Secondary | ICD-10-CM | POA: Diagnosis present

## 2015-06-04 DIAGNOSIS — M810 Age-related osteoporosis without current pathological fracture: Secondary | ICD-10-CM | POA: Diagnosis present

## 2015-06-04 DIAGNOSIS — R109 Unspecified abdominal pain: Secondary | ICD-10-CM

## 2015-06-04 HISTORY — DX: Fatty (change of) liver, not elsewhere classified: K76.0

## 2015-06-04 LAB — URINALYSIS, ROUTINE W REFLEX MICROSCOPIC
Bilirubin Urine: NEGATIVE
Glucose, UA: NEGATIVE mg/dL
Hgb urine dipstick: NEGATIVE
Ketones, ur: NEGATIVE mg/dL
Leukocytes, UA: NEGATIVE
Nitrite: NEGATIVE
Protein, ur: 30 mg/dL — AB
Specific Gravity, Urine: 1.026 (ref 1.005–1.030)
pH: 5.5 (ref 5.0–8.0)

## 2015-06-04 LAB — CBC WITH DIFFERENTIAL/PLATELET
Basophils Absolute: 0 10*3/uL (ref 0.0–0.1)
Basophils Relative: 0 %
Eosinophils Absolute: 0.2 10*3/uL (ref 0.0–0.7)
Eosinophils Relative: 2 %
HCT: 41.6 % (ref 39.0–52.0)
Hemoglobin: 13.7 g/dL (ref 13.0–17.0)
Lymphocytes Relative: 32 %
Lymphs Abs: 2.2 10*3/uL (ref 0.7–4.0)
MCH: 27.8 pg (ref 26.0–34.0)
MCHC: 32.9 g/dL (ref 30.0–36.0)
MCV: 84.4 fL (ref 78.0–100.0)
Monocytes Absolute: 0.8 10*3/uL (ref 0.1–1.0)
Monocytes Relative: 12 %
Neutro Abs: 3.8 10*3/uL (ref 1.7–7.7)
Neutrophils Relative %: 54 %
Platelets: 217 10*3/uL (ref 150–400)
RBC: 4.93 MIL/uL (ref 4.22–5.81)
RDW: 13 % (ref 11.5–15.5)
WBC: 7 10*3/uL (ref 4.0–10.5)

## 2015-06-04 LAB — COMPREHENSIVE METABOLIC PANEL
ALT: 18 U/L (ref 17–63)
AST: 16 U/L (ref 15–41)
Albumin: 4.1 g/dL (ref 3.5–5.0)
Alkaline Phosphatase: 84 U/L (ref 38–126)
Anion gap: 6 (ref 5–15)
BUN: 14 mg/dL (ref 6–20)
CO2: 26 mmol/L (ref 22–32)
Calcium: 9.4 mg/dL (ref 8.9–10.3)
Chloride: 107 mmol/L (ref 101–111)
Creatinine, Ser: 1.21 mg/dL (ref 0.61–1.24)
GFR calc Af Amer: >60 mL/min (ref >60)
GFR calc non Af Amer: >60 mL/min (ref >60)
Glucose, Bld: 108 mg/dL — ABNORMAL HIGH (ref 65–99)
Potassium: 3.9 mmol/L (ref 3.5–5.1)
Sodium: 139 mmol/L (ref 135–145)
Total Bilirubin: 0.9 mg/dL (ref 0.3–1.2)
Total Protein: 7.4 g/dL (ref 6.5–8.1)

## 2015-06-04 LAB — URINE MICROSCOPIC-ADD ON
RBC / HPF: NONE SEEN RBC/hpf (ref 0–5)
WBC, UA: NONE SEEN WBC/hpf (ref 0–5)

## 2015-06-04 LAB — LIPASE, BLOOD: Lipase: 28 U/L (ref 11–51)

## 2015-06-04 MED ORDER — FENTANYL CITRATE (PF) 100 MCG/2ML IJ SOLN
100.0000 ug | Freq: Once | INTRAMUSCULAR | Status: AC
  Administered 2015-06-04: 100 ug via INTRAVENOUS
  Filled 2015-06-04: qty 2

## 2015-06-04 MED ORDER — PRAVASTATIN SODIUM 40 MG PO TABS
40.0000 mg | ORAL_TABLET | Freq: Every day | ORAL | Status: DC
Start: 2015-06-05 — End: 2015-06-13
  Administered 2015-06-05 – 2015-06-12 (×7): 40 mg via ORAL
  Filled 2015-06-04 (×7): qty 1

## 2015-06-04 MED ORDER — METOPROLOL TARTRATE 50 MG PO TABS
50.0000 mg | ORAL_TABLET | Freq: Every day | ORAL | Status: DC
  Administered 2015-06-05 – 2015-06-13 (×9): 50 mg via ORAL
  Filled 2015-06-04 (×9): qty 1

## 2015-06-04 MED ORDER — FENTANYL CITRATE (PF) 100 MCG/2ML IJ SOLN
50.0000 ug | Freq: Once | INTRAMUSCULAR | Status: AC
  Administered 2015-06-04: 50 ug via INTRAVENOUS
  Filled 2015-06-04: qty 2

## 2015-06-04 MED ORDER — IOHEXOL 300 MG/ML  SOLN
50.0000 mL | Freq: Once | INTRAMUSCULAR | Status: DC | PRN
  Administered 2015-06-04: 50 mL via INTRAVENOUS

## 2015-06-04 MED ORDER — TRIAMCINOLONE ACETONIDE 0.1 % EX OINT
1.0000 "application " | TOPICAL_OINTMENT | Freq: Two times a day (BID) | CUTANEOUS | Status: DC
  Administered 2015-06-04 – 2015-06-10 (×2): 1 via TOPICAL
  Filled 2015-06-04: qty 15

## 2015-06-04 MED ORDER — DOCUSATE SODIUM 100 MG PO CAPS
100.0000 mg | ORAL_CAPSULE | Freq: Two times a day (BID) | ORAL | Status: DC
  Administered 2015-06-04 – 2015-06-06 (×5): 100 mg via ORAL
  Filled 2015-06-04 (×5): qty 1

## 2015-06-04 MED ORDER — CLOBETASOL PROPIONATE 0.05 % EX CREA
1.0000 "application " | TOPICAL_CREAM | Freq: Two times a day (BID) | CUTANEOUS | Status: DC
  Administered 2015-06-10 – 2015-06-12 (×3): 1 via TOPICAL
  Filled 2015-06-04: qty 15

## 2015-06-04 MED ORDER — SODIUM CHLORIDE 0.9 % IV BOLUS (SEPSIS)
1000.0000 mL | Freq: Once | INTRAVENOUS | Status: AC
  Administered 2015-06-04: 1000 mL via INTRAVENOUS

## 2015-06-04 MED ORDER — FLUTICASONE PROPIONATE 50 MCG/ACT NA SUSP
2.0000 | Freq: Every day | NASAL | Status: DC
  Administered 2015-06-06: 2 via NASAL
  Filled 2015-06-04: qty 16

## 2015-06-04 MED ORDER — ADULT MULTIVITAMIN W/MINERALS CH
1.0000 | ORAL_TABLET | Freq: Every day | ORAL | Status: DC
Start: 2015-06-05 — End: 2015-06-13
  Administered 2015-06-06 – 2015-06-13 (×8): 1 via ORAL
  Filled 2015-06-04 (×9): qty 1

## 2015-06-04 MED ORDER — ALPRAZOLAM 0.5 MG PO TABS
1.0000 mg | ORAL_TABLET | Freq: Every day | ORAL | Status: DC | PRN
  Administered 2015-06-08 – 2015-06-09 (×2): 1 mg via ORAL
  Filled 2015-06-04 (×2): qty 2

## 2015-06-04 MED ORDER — FENTANYL CITRATE (PF) 100 MCG/2ML IJ SOLN: 90.0000 ug | Freq: Once | INTRAMUSCULAR | Status: DC

## 2015-06-04 MED ORDER — IOHEXOL 300 MG/ML  SOLN
25.0000 mL | Freq: Once | INTRAMUSCULAR | Status: AC | PRN
  Administered 2015-06-04: 25 mL via ORAL

## 2015-06-04 MED ORDER — AMLODIPINE BESYLATE 5 MG PO TABS
5.0000 mg | ORAL_TABLET | Freq: Every day | ORAL | Status: DC
  Administered 2015-06-06 – 2015-06-13 (×8): 5 mg via ORAL
  Filled 2015-06-04 (×9): qty 1

## 2015-06-04 MED ORDER — ONDANSETRON HCL 4 MG PO TABS: 4.0000 mg | ORAL_TABLET | Freq: Four times a day (QID) | ORAL | Status: DC | PRN

## 2015-06-04 MED ORDER — TAMSULOSIN HCL 0.4 MG PO CAPS
0.4000 mg | ORAL_CAPSULE | Freq: Every day | ORAL | Status: DC
  Administered 2015-06-05 – 2015-06-13 (×7): 0.4 mg via ORAL
  Filled 2015-06-04 (×9): qty 1

## 2015-06-04 MED ORDER — HEPARIN SODIUM (PORCINE) 5000 UNIT/ML IJ SOLN
5000.0000 [IU] | Freq: Three times a day (TID) | INTRAMUSCULAR | Status: DC
  Administered 2015-06-04 – 2015-06-13 (×27): 5000 [IU] via SUBCUTANEOUS
  Filled 2015-06-04 (×25): qty 1

## 2015-06-04 MED ORDER — FERROUS FUMARATE-FOLIC ACID 324-1 MG PO TABS: 1.0000 | ORAL_TABLET | Freq: Every day | ORAL | Status: DC

## 2015-06-04 MED ORDER — BENAZEPRIL HCL 20 MG PO TABS
20.0000 mg | ORAL_TABLET | Freq: Every day | ORAL | Status: DC
  Administered 2015-06-05 – 2015-06-06 (×2): 20 mg via ORAL
  Filled 2015-06-04 (×3): qty 1

## 2015-06-04 MED ORDER — FERROUS FUMARATE 325 (106 FE) MG PO TABS
1.0000 | ORAL_TABLET | Freq: Every day | ORAL | Status: DC
  Administered 2015-06-06: 106 mg via ORAL
  Filled 2015-06-04 (×2): qty 1

## 2015-06-04 MED ORDER — FOLIC ACID 1 MG PO TABS
1.0000 mg | ORAL_TABLET | Freq: Every day | ORAL | Status: DC
  Administered 2015-06-06: 1 mg via ORAL
  Filled 2015-06-04 (×2): qty 1

## 2015-06-04 MED ORDER — ONDANSETRON HCL 4 MG/2ML IJ SOLN
4.0000 mg | Freq: Four times a day (QID) | INTRAMUSCULAR | Status: DC | PRN
  Administered 2015-06-11: 4 mg via INTRAVENOUS
  Filled 2015-06-04: qty 2

## 2015-06-04 MED ORDER — SODIUM CHLORIDE 0.9 % IV SOLN
INTRAVENOUS | Status: DC
  Administered 2015-06-04 – 2015-06-09 (×7): via INTRAVENOUS

## 2015-06-04 NOTE — Progress Notes (Addendum)
Received patient from [employer] via [place].  Patient AOx4, VS stable, on RA at 99% O2Sat, pain controlled and denies pain, and ambulatory.  Paged admitting MD and orders received.  CNA oriented patient room, bed controls and call light.  Significant other at bedside.

## 2015-06-04 NOTE — ED Provider Notes (Signed)
CSN: [id number]     Arrival date & time 06/04/15  1630 History   First MD Initiated Contact with Patient 06/04/15 1638     Chief Complaint  Patient presents with  . Abdominal Pain    HPI   Chappell Sartini is a 69 y.o. M PMH significant for HLD, HTN, tubular adenoma of the colon presenting with a 3 day history of generalized abdominal pain, diarrhea, and nausea. He describes his pain as 8/10 pain scale, constant, sharp, lower abdominal in location, non-radiating worsened by movement. His last bowel movement was a "partial" solid one this morning. He has been tolerating food. He endorses gas from above and below and hematochezia (he endorses a chronic history of this d/t internal hemorrhoids). He denies fever, chills, CP, SOB, emesis, recent abx use, ill contacts, raw/undercooked foods.    His last colonoscopy was in Feb 2016, which demonstrated multiple polyps (pathology deemed these diminutive adenoma and suggested repeat colon 2021), sigmoid diverticulosis, internal and external hemorrhoids with banding scars in right anterior rectum, otherwise normal colonoscopy.   Past Medical History  Diagnosis Date  . Vitamin D deficiency   . Back pain 10/10/2012  . Arthritis   . Hyperlipidemia   . Hypertension   . Obesity (BMI 30.0-34.9)   . Allergic state 10/10/2012  . Osteoporosis   . Anemia   . Nocturia 08/21/2013  . Hyperglycemia 08/21/2013  . Unspecified constipation 02/06/2014  . Internal hemorrhoids   . Tubular adenoma of colon   . Rectal bleeding 07/30/2014  . Colon polyps 2013  . Anemia 08/14/2014  . Anxiety   . Hemorrhoids, internal, with bleeding 07/30/2014   Past Surgical History  Procedure Laterality Date  . Tonsillectomy  1957  . Fracture surgery Left 1989    leg  . Knee surgery Left 2010    arthroscopy, torn carilage  . Knee surgery Right 2012    arthroscopy  . Hemorrhoid banding  2016  . Colonoscopy     Family History  Problem Relation Age of Onset  . Stroke Mother   .  Aneurysm Mother     brain  . Arthritis Father   . Liver cancer Father     liver, atomic testing in [employer]  . Gallbladder disease Father   . Hyperlipidemia Sister   . Hypertension Sister   . Obesity Sister   . Diabetes Sister   . Diabetes Brother   . Hyperlipidemia Sister   . Hypertension Sister   . Obesity Sister   . Diabetes Sister   . Aneurysm Brother     brain  . COPD Brother   . Colon cancer Neg Hx   . Colon polyps Neg Hx   . Esophageal cancer Neg Hx   . Rectal cancer Neg Hx   . Stomach cancer Neg Hx   . Gallbladder disease Sister     x4   Social History  Substance Use Topics  . Smoking status: Never Smoker   . Smokeless tobacco: Never Used  . Alcohol Use: 1.2 oz/week    2 Standard drinks or equivalent per week     Comment: Occassionally    Review of Systems  Ten systems are reviewed and are negative for acute change except as noted in the HPI   Allergies  Review of patient's allergies indicates no known allergies.  Home Medications   Prior to Admission medications   Medication Sig Start Date End Date Taking? Authorizing Provider  ALPRAZolam Duanne Moron) 1 MG tablet Take 1 tablet (1  mg total) by mouth daily as needed for sleep or anxiety. Patient not taking: Reported on 03/08/2015 11/26/12   Mosie Lukes, MD  amLODipine-benazepril (LOTREL) 5-20 MG per capsule TAKE 1 CAPSULE DAILY 03/11/15   Mosie Lukes, MD  clobetasol cream (TEMOVATE) [id number] % Apply 1 application topically 2 (two) times daily. 07/01/13   Brunetta Jeans, PA-C  Ferrous Fumarate-Folic Acid 99991111 MG TABS Take one by mouth daily Patient not taking: Reported on 03/08/2015 08/08/14   Mosie Lukes, MD  fluticasone [facility]) 50 MCG/ACT nasal spray Place 2 sprays into both nostrils daily. 06/19/14   Percell Miller Saguier, PA-C  hydrocortisone (ANUSOL-HC) 25 MG suppository Place 1 suppository (25 mg total) rectally 2 (two) times daily. Patient not taking: Reported on 03/08/2015 07/31/14   Cecille Rubin P Hvozdovic, PA-C   ibuprofen (ADVIL,MOTRIN) 400 MG tablet TAKE 1 TABLET (400 MG) BY MOUTH EVERY 6 HOURS AS NEEDED. 04/13/15   Mosie Lukes, MD  LIVALO 2 MG TABS TAKE 1 TABLET DAILY 06/04/15   Mosie Lukes, MD  metoprolol (LOPRESSOR) 50 MG tablet TAKE 1 TABLET DAILY 03/11/15   Mosie Lukes, MD  Multiple Vitamin (MULTIVITAMIN) tablet Take 1 tablet by mouth daily.    Historical Provider, MD  tamsulosin (FLOMAX) 0.4 MG CAPS capsule Take 1 capsule (0.4 mg total) by mouth daily. 08/07/14   Mosie Lukes, MD  triamcinolone ointment (KENALOG) 0.1 % Apply 1 application topically 2 (two) times daily. 03/08/15   Midge Minium, MD   BP 121/95 mmHg  Pulse 83  Temp(Src) 99.3 F (37.4 C) (Oral)  Resp 16  Ht 5' 10.5" (1.791 m)  Wt 97.07 kg  BMI 30.26 kg/m2  SpO2 100% Physical Exam  Constitutional: He appears well-developed and well-nourished. No distress.  HENT:  Head: Normocephalic and atraumatic.  Mouth/Throat: Oropharynx is clear and moist. No oropharyngeal exudate.  Eyes: Conjunctivae are normal. Pupils are equal, round, and reactive to light. Right eye exhibits no discharge. Left eye exhibits no discharge. No scleral icterus.  Neck: No tracheal deviation present.  Cardiovascular: Normal rate, regular rhythm, normal heart sounds and intact distal pulses.  Exam reveals no gallop and no friction rub.   No murmur heard. Pulmonary/Chest: Effort normal and breath sounds normal. No respiratory distress. He has no wheezes. He has no rales. He exhibits no tenderness.  Abdominal: Soft. Bowel sounds are normal. He exhibits distension. He exhibits no mass. There is tenderness. There is no rebound and no guarding.  Diffuse abdominal tenderness, especially periumbilical region. Negative Murphy's, CVA tenderness, McBurney's, Rosving's.   Musculoskeletal: He exhibits no edema.  Lymphadenopathy:    He has no cervical adenopathy.  Neurological: He is alert. Coordination normal.  Skin: Skin is warm and dry. No rash noted. He  is not diaphoretic. No erythema.  Psychiatric: He has a normal mood and affect. His behavior is normal.  Nursing note and vitals reviewed.   ED Course  Procedures (including critical care time) Labs Review Labs Reviewed  URINALYSIS, ROUTINE W REFLEX MICROSCOPIC (NOT AT [facility]) - Abnormal; Notable for the following:    Protein, ur 30 (*)    All other components within normal limits  COMPREHENSIVE METABOLIC PANEL - Abnormal; Notable for the following:    Glucose, Bld 108 (*)    All other components within normal limits  URINE MICROSCOPIC-ADD ON - Abnormal; Notable for the following:    Squamous Epithelial / LPF 0-5 (*)    Bacteria, UA RARE (*)    Crystals  CA OXALATE CRYSTALS (*)    All other components within normal limits  CBC WITH DIFFERENTIAL/PLATELET  LIPASE, BLOOD    Imaging Review Ct Abdomen Pelvis W Contrast  06/04/2015  CLINICAL DATA:  Generalized abdominal pain and distention for 3 days. Diarrhea. EXAM: CT ABDOMEN AND PELVIS WITH CONTRAST TECHNIQUE: Multidetector CT imaging of the abdomen and pelvis was performed using the standard protocol following bolus administration of intravenous contrast. CONTRAST:  48mL OMNIPAQUE IOHEXOL 300 MG/ML SOLN, 65mL OMNIPAQUE IOHEXOL 300 MG/ML SOLN COMPARISON:  None. FINDINGS: Lower chest:  No acute findings. Hepatobiliary: Mild hepatic steatosis noted. Gallbladder is unremarkable. Pancreas: No mass, inflammatory changes, or other significant abnormality. Spleen: Within normal limits in size and appearance. Adrenals/Urinary Tract: Bilateral renal cysts noted. Tiny 2 mm nonobstructive right renal calculus seen. No evidence of ureteral calculi or hydronephrosis. Stomach/Bowel: Multiple dilated mid and distal small bowel loops are seen. Wall thickening is seen involving a loop of small bowel in the anterior lower abdomen. Adjacent soft tissue stranding seen in the mesenteric fat and small amount of free fluid noted in the pelvis. Normal appendix  visualized. No evidence of colonic dilatation or wall thickening. Vascular/Lymphatic: No pathologically enlarged lymph nodes. No evidence of abdominal aortic aneurysm. Reproductive: No mass or other significant abnormality. Other: Small paraumbilical hernia containing only fat. No herniated bowel loops. Musculoskeletal:  No suspicious bone lesions identified. IMPRESSION: Mid and distal small bowel dilatation, with wall thickening involving a small bowel loop in the anterior lower abdomen. Differential diagnosis includes small bowel ileus versus obstruction, with associated enteritis. Small amount of free fluid in pelvis.  No evidence of abscess. Tiny nonobstructive right renal calculus. Electronically Signed   By: Earle Gell M.D.   On: 06/04/2015 18:36   I have personally reviewed and evaluated these images and lab results as part of my medical decision-making.   EKG Interpretation None      MDM   Final diagnoses:  Intestinal obstruction, unspecified type ([place])   Patient non-toxic appearing and VSS. Most likely gastroenteritis vs appendicitis vs diverticulitis vs mass.   Proteinuria, calcium oxalate crystals on urinalysis.  CT demonstrated mid and distal small bowel dilation, anterior lower abdominal wall thickening, and possible small bowel ileus vs obstruction with associated enteritis.  There was a tiny nonobstructive right renal calculus.   Patient will need to be admitted for bowel rest and monitoring.   Spoke with Dr. Myna Hidalgo who will accept patient. Reassessed patient, explained plan. Completed EMTALA. Patient ready for transfer.  Plain Lions, PA-C 06/06/15 2330  Harvel Quale, MD 06/07/15 [phone]

## 2015-06-04 NOTE — ED Notes (Signed)
Abdominal pain x 3 days. Diarrhea and vomiting.

## 2015-06-04 NOTE — Progress Notes (Signed)
48 yom w/ HTN, HLD, and tubular adenomas on recent colonoscopy who p/w 3 days abd pain, diarrhea and nausea.  Also notes small amt of hematochezia but states is chronic from internal hemorrhoids.  CT in ED with mid and distal sm bowel dilation with wall thickening.  Per radiology, sm bowel ileus vs SBO, associated with enteritis.  Afebrile, no wbc.  Hgb and vitals good in ED.  Pain controlled with fentanyl x1.

## 2015-06-05 ENCOUNTER — Observation Stay (HOSPITAL_COMMUNITY): Payer: Medicare Other

## 2015-06-05 ENCOUNTER — Encounter (HOSPITAL_COMMUNITY): Payer: Self-pay | Admitting: Physician Assistant

## 2015-06-05 DIAGNOSIS — N400 Enlarged prostate without lower urinary tract symptoms: Secondary | ICD-10-CM | POA: Diagnosis present

## 2015-06-05 DIAGNOSIS — R109 Unspecified abdominal pain: Secondary | ICD-10-CM | POA: Diagnosis not present

## 2015-06-05 DIAGNOSIS — R1013 Epigastric pain: Secondary | ICD-10-CM | POA: Diagnosis not present

## 2015-06-05 DIAGNOSIS — K649 Unspecified hemorrhoids: Secondary | ICD-10-CM

## 2015-06-05 DIAGNOSIS — Z86010 Personal history of colonic polyps: Secondary | ICD-10-CM

## 2015-06-05 DIAGNOSIS — E785 Hyperlipidemia, unspecified: Secondary | ICD-10-CM

## 2015-06-05 DIAGNOSIS — E7800 Pure hypercholesterolemia, unspecified: Secondary | ICD-10-CM | POA: Diagnosis present

## 2015-06-05 DIAGNOSIS — R1032 Left lower quadrant pain: Secondary | ICD-10-CM | POA: Diagnosis not present

## 2015-06-05 DIAGNOSIS — E669 Obesity, unspecified: Secondary | ICD-10-CM

## 2015-06-05 DIAGNOSIS — D649 Anemia, unspecified: Secondary | ICD-10-CM | POA: Diagnosis present

## 2015-06-05 DIAGNOSIS — M1990 Unspecified osteoarthritis, unspecified site: Secondary | ICD-10-CM | POA: Diagnosis present

## 2015-06-05 DIAGNOSIS — K5669 Other intestinal obstruction: Secondary | ICD-10-CM | POA: Diagnosis not present

## 2015-06-05 DIAGNOSIS — I10 Essential (primary) hypertension: Secondary | ICD-10-CM

## 2015-06-05 DIAGNOSIS — K567 Ileus, unspecified: Secondary | ICD-10-CM | POA: Diagnosis present

## 2015-06-05 DIAGNOSIS — K5660 Unspecified intestinal obstruction: Secondary | ICD-10-CM | POA: Diagnosis not present

## 2015-06-05 DIAGNOSIS — R1084 Generalized abdominal pain: Secondary | ICD-10-CM | POA: Diagnosis not present

## 2015-06-05 DIAGNOSIS — Z79899 Other long term (current) drug therapy: Secondary | ICD-10-CM | POA: Diagnosis not present

## 2015-06-05 DIAGNOSIS — I491 Atrial premature depolarization: Secondary | ICD-10-CM | POA: Diagnosis present

## 2015-06-05 DIAGNOSIS — R197 Diarrhea, unspecified: Secondary | ICD-10-CM | POA: Diagnosis not present

## 2015-06-05 DIAGNOSIS — Z6830 Body mass index (BMI) 30.0-30.9, adult: Secondary | ICD-10-CM | POA: Diagnosis not present

## 2015-06-05 DIAGNOSIS — M810 Age-related osteoporosis without current pathological fracture: Secondary | ICD-10-CM | POA: Diagnosis present

## 2015-06-05 DIAGNOSIS — F419 Anxiety disorder, unspecified: Secondary | ICD-10-CM | POA: Diagnosis present

## 2015-06-05 DIAGNOSIS — R933 Abnormal findings on diagnostic imaging of other parts of digestive tract: Secondary | ICD-10-CM | POA: Diagnosis not present

## 2015-06-05 DIAGNOSIS — E876 Hypokalemia: Secondary | ICD-10-CM | POA: Diagnosis not present

## 2015-06-05 DIAGNOSIS — K529 Noninfective gastroenteritis and colitis, unspecified: Secondary | ICD-10-CM | POA: Diagnosis not present

## 2015-06-05 DIAGNOSIS — R195 Other fecal abnormalities: Secondary | ICD-10-CM | POA: Diagnosis not present

## 2015-06-05 DIAGNOSIS — A09 Infectious gastroenteritis and colitis, unspecified: Secondary | ICD-10-CM | POA: Diagnosis not present

## 2015-06-05 DIAGNOSIS — R112 Nausea with vomiting, unspecified: Secondary | ICD-10-CM | POA: Diagnosis not present

## 2015-06-05 LAB — COMPREHENSIVE METABOLIC PANEL
ALT: 16 U/L — ABNORMAL LOW (ref 17–63)
AST: 16 U/L (ref 15–41)
Albumin: 3.2 g/dL — ABNORMAL LOW (ref 3.5–5.0)
Alkaline Phosphatase: 71 U/L (ref 38–126)
Anion gap: 4 — ABNORMAL LOW (ref 5–15)
BUN: 9 mg/dL (ref 6–20)
CO2: 23 mmol/L (ref 22–32)
Calcium: 8.4 mg/dL — ABNORMAL LOW (ref 8.9–10.3)
Chloride: 111 mmol/L (ref 101–111)
Creatinine, Ser: 1.13 mg/dL (ref 0.61–1.24)
GFR calc Af Amer: >60 mL/min (ref >60)
GFR calc non Af Amer: >60 mL/min (ref >60)
Glucose, Bld: 112 mg/dL — ABNORMAL HIGH (ref 65–99)
Potassium: 3.9 mmol/L (ref 3.5–5.1)
Sodium: 138 mmol/L (ref 135–145)
Total Bilirubin: 1 mg/dL (ref 0.3–1.2)
Total Protein: 5.9 g/dL — ABNORMAL LOW (ref 6.5–8.1)

## 2015-06-05 MED ORDER — FENTANYL CITRATE (PF) 100 MCG/2ML IJ SOLN
25.0000 ug | INTRAMUSCULAR | Status: DC | PRN
  Administered 2015-06-05 – 2015-06-07 (×12): 25 ug via INTRAVENOUS
  Filled 2015-06-05 (×12): qty 2

## 2015-06-05 MED ORDER — CHLORHEXIDINE GLUCONATE 0.12 % MT SOLN
15.0000 mL | Freq: Two times a day (BID) | OROMUCOSAL | Status: DC
  Administered 2015-06-05: 15 mL via OROMUCOSAL
  Filled 2015-06-05 (×2): qty 15

## 2015-06-05 MED ORDER — BISACODYL 5 MG PO TBEC
10.0000 mg | DELAYED_RELEASE_TABLET | Freq: Every day | ORAL | Status: DC
  Administered 2015-06-05 – 2015-06-06 (×2): 10 mg via ORAL
  Filled 2015-06-05 (×2): qty 2

## 2015-06-05 MED ORDER — CETYLPYRIDINIUM CHLORIDE 0.05 % MT LIQD: 7.0000 mL | Freq: Two times a day (BID) | OROMUCOSAL | Status: DC

## 2015-06-05 MED ORDER — SIMETHICONE 80 MG PO CHEW
80.0000 mg | CHEWABLE_TABLET | Freq: Four times a day (QID) | ORAL | Status: DC | PRN
  Administered 2015-06-05: 80 mg via ORAL
  Filled 2015-06-05: qty 1

## 2015-06-05 NOTE — H&P (Signed)
[facility] History and Physical  [name] [id number] DOB: [birth year] DOA: 06/04/2015  Referring physician: [facility] [place] [facility] PCP: Penni Homans, MD   Chief Complaint: Abdominal pain  HPI:  [name] is a 69 year old male with a past medical history significant for obesity, hypertension, hyperlipidemia, colon polyps, and hemorrhoids; who presents with complaints of generalized abdominal pain. He notes symptoms initially started approximately 1 month ago with complaints of increased gas. However, 4 days ago he started to have a constant sharp abdominal pain that was all over. He describes the pain as a 10 out of 10 when he decided to have it evaluated today. He complained of some nausea. He denies any vomiting, chest pain, shortness of breath, recent antibiotics, or recent sick contacts. His wife noted that his belly was more distended than usual. At baseline the patient has had issues with hemorrhoids in the past for which he's had intermittent blood in his stool.  He drinks prune juice every now and then, but denies being on a set bowel regimen. He reports still being able to passing flatus. Associated symptoms included diarrhea whenever he eat anything. He reports having at least 4 bowel movements per day with loose watery stools. His last colonoscopy was 08/25/2014 where the patient was found to have 2 polyps with internal and external hemorrhoids. Upon arrival to [facility] the patient was evaluated with a CT scan which showed a mid and distal small bowel dilation with wall thickening suggestive of a possible small bowel obstruction versus enteritis. Patient was found to be afebrile with no significant elevation in white blood cell count. Transferred to [facility] for observation    Review of Systems  Constitutional: Negative for fever and chills.  HENT: Negative for ear pain and hearing loss.   Eyes: Negative for double vision and discharge.  Respiratory:  Negative for hemoptysis, sputum production and shortness of breath.   Cardiovascular: Negative for palpitations and orthopnea.  Gastrointestinal: Positive for nausea, abdominal pain, diarrhea and blood in stool. Negative for vomiting and constipation.  Genitourinary: Negative for dysuria and frequency.  Musculoskeletal: Negative for myalgias and neck pain.  Skin: Negative for itching and rash.  Neurological: Negative for dizziness and sensory change.  Endo/Heme/Allergies: Negative for polydipsia. Does not bruise/bleed easily.  Psychiatric/Behavioral: Negative for suicidal ideas and substance abuse.      Past Medical History  Diagnosis Date  . Vitamin D deficiency   . Back pain 10/10/2012  . Arthritis   . Hyperlipidemia   . Hypertension   . Obesity (BMI 30.0-34.9)   . Allergic state 10/10/2012  . Osteoporosis   . Anemia   . Nocturia 08/21/2013  . Hyperglycemia 08/21/2013  . Unspecified constipation 02/06/2014  . Internal hemorrhoids   . Tubular adenoma of colon   . Rectal bleeding 07/30/2014  . Colon polyps 2013  . Anemia 08/14/2014  . Anxiety   . Hemorrhoids, internal, with bleeding 07/30/2014     Past Surgical History  Procedure Laterality Date  . Tonsillectomy  1957  . Fracture surgery Left 1989    leg  . Knee surgery Left 2010    arthroscopy, torn carilage  . Knee surgery Right 2012    arthroscopy  . Hemorrhoid banding  2016  . Colonoscopy        Social History:  reports that he has never smoked. He has never used smokeless tobacco. He reports that he drinks about 1.2 oz of alcohol per week. He reports that  he does not use illicit drugs. Where does patient live--home and with whom if at home? Wife Can patient participate in ADLs? Yes  No Known Allergies  Family History  Problem Relation Age of Onset  . Stroke Mother   . Aneurysm Mother     brain  . Arthritis Father   . Liver cancer Father     liver, atomic testing in [employer]  . Gallbladder disease Father    . Hyperlipidemia Sister   . Hypertension Sister   . Obesity Sister   . Diabetes Sister   . Diabetes Brother   . Hyperlipidemia Sister   . Hypertension Sister   . Obesity Sister   . Diabetes Sister   . Aneurysm Brother     brain  . COPD Brother   . Colon cancer Neg Hx   . Colon polyps Neg Hx   . Esophageal cancer Neg Hx   . Rectal cancer Neg Hx   . Stomach cancer Neg Hx   . Gallbladder disease Sister     x4        Prior to Admission medications   Medication Sig Start Date End Date Taking? Authorizing Provider  ALPRAZolam Duanne Moron) 1 MG tablet Take 1 tablet (1 mg total) by mouth daily as needed for sleep or anxiety. Patient not taking: Reported on 03/08/2015 11/26/12   Mosie Lukes, MD  amLODipine-benazepril (LOTREL) 5-20 MG per capsule TAKE 1 CAPSULE DAILY 03/11/15   Mosie Lukes, MD  clobetasol cream (TEMOVATE) [id number] % Apply 1 application topically 2 (two) times daily. 07/01/13   Brunetta Jeans, PA-C  Ferrous Fumarate-Folic Acid 99991111 MG TABS Take one by mouth daily Patient not taking: Reported on 03/08/2015 08/08/14   Mosie Lukes, MD  fluticasone [facility]) 50 MCG/ACT nasal spray Place 2 sprays into both nostrils daily. 06/19/14   Percell Miller Saguier, PA-C  hydrocortisone (ANUSOL-HC) 25 MG suppository Place 1 suppository (25 mg total) rectally 2 (two) times daily. Patient not taking: Reported on 03/08/2015 07/31/14   Cecille Rubin P Hvozdovic, PA-C  ibuprofen (ADVIL,MOTRIN) 400 MG tablet TAKE 1 TABLET (400 MG) BY MOUTH EVERY 6 HOURS AS NEEDED. 04/13/15   Mosie Lukes, MD  LIVALO 2 MG TABS TAKE 1 TABLET DAILY 06/04/15   Mosie Lukes, MD  metoprolol (LOPRESSOR) 50 MG tablet TAKE 1 TABLET DAILY 03/11/15   Mosie Lukes, MD  Multiple Vitamin (MULTIVITAMIN) tablet Take 1 tablet by mouth daily.    Historical Provider, MD  tamsulosin (FLOMAX) 0.4 MG CAPS capsule Take 1 capsule (0.4 mg total) by mouth daily. 08/07/14   Mosie Lukes, MD  triamcinolone ointment (KENALOG) 0.1 % Apply 1 application  topically 2 (two) times daily. 03/08/15   Midge Minium, MD     Physical Exam: Filed Vitals:   06/04/15 1635 06/04/15 1903 06/04/15 2237  BP: 131/98 121/95 126/79  Pulse: 84 83 82  Temp: 98.4 F (36.9 C) 99.3 F (37.4 C) 98.1 F (36.7 C)  TempSrc: Oral Oral Oral  Resp: 18 16 18   Height: 5' 10.5" (1.791 m)  5' 10.5" (1.791 m)  Weight: 97.07 kg (214 lb)  97 kg (213 lb 13.5 oz)  SpO2: 100% 100% 99%     Constitutional: Vital signs reviewed. Patient is a obese male in no acute distress and cooperative with exam. Alert and oriented x3.  Head: Normocephalic and atraumatic  Ear: TM normal bilaterally  Mouth: no erythema or exudates, MMM  Eyes: PERRL, EOMI, conjunctivae normal, No scleral icterus.  Neck: Supple, Trachea midline normal ROM, No JVD, mass, thyromegaly, or carotid bruit present.  Cardiovascular: RRR, S1 normal, S2 normal, no MRG, pulses symmetric and intact bilaterally  Pulmonary/Chest: CTAB, no wheezes, rales, or rhonchi  Abdominal: Mildly distended with generalized tenderness to palpation in all 4 quadrants. No rebound tenderness or guarding present. Positive bowel sounds found in all 4 quadrants GU: no CVA tenderness Musculoskeletal: No joint deformities, erythema, or stiffness, ROM full and no nontender Ext: no edema and no cyanosis, pulses palpable bilaterally (DP and PT)  Hematology: no cervical, inginal, or axillary adenopathy.  Neurological: A&O x3, Strenght is normal and symmetric bilaterally, cranial nerve II-XII are grossly intact, no focal motor deficit, sensory intact to light touch bilaterally.  Skin: Warm, dry and intact. No rash, cyanosis, or clubbing.  Psychiatric: Normal mood and affect. speech and behavior is normal. Judgment and thought content normal. Cognition and memory are normal.      Data Review   Micro Results No results found for this or any previous visit (from the past 240 hour(s)).  Radiology Reports Ct Abdomen Pelvis W  Contrast  06/04/2015  CLINICAL DATA:  Generalized abdominal pain and distention for 3 days. Diarrhea. EXAM: CT ABDOMEN AND PELVIS WITH CONTRAST TECHNIQUE: Multidetector CT imaging of the abdomen and pelvis was performed using the standard protocol following bolus administration of intravenous contrast. CONTRAST:  2mL OMNIPAQUE IOHEXOL 300 MG/ML SOLN, 68mL OMNIPAQUE IOHEXOL 300 MG/ML SOLN COMPARISON:  None. FINDINGS: Lower chest:  No acute findings. Hepatobiliary: Mild hepatic steatosis noted. Gallbladder is unremarkable. Pancreas: No mass, inflammatory changes, or other significant abnormality. Spleen: Within normal limits in size and appearance. Adrenals/Urinary Tract: Bilateral renal cysts noted. Tiny 2 mm nonobstructive right renal calculus seen. No evidence of ureteral calculi or hydronephrosis. Stomach/Bowel: Multiple dilated mid and distal small bowel loops are seen. Wall thickening is seen involving a loop of small bowel in the anterior lower abdomen. Adjacent soft tissue stranding seen in the mesenteric fat and small amount of free fluid noted in the pelvis. Normal appendix visualized. No evidence of colonic dilatation or wall thickening. Vascular/Lymphatic: No pathologically enlarged lymph nodes. No evidence of abdominal aortic aneurysm. Reproductive: No mass or other significant abnormality. Other: Small paraumbilical hernia containing only fat. No herniated bowel loops. Musculoskeletal:  No suspicious bone lesions identified. IMPRESSION: Mid and distal small bowel dilatation, with wall thickening involving a small bowel loop in the anterior lower abdomen. Differential diagnosis includes small bowel ileus versus obstruction, with associated enteritis. Small amount of free fluid in pelvis.  No evidence of abscess. Tiny nonobstructive right renal calculus. Electronically Signed   By: Earle Gell M.D.   On: 06/04/2015 18:36     CBC  Recent Labs Lab 06/04/15 1655  WBC 7.0  HGB 13.7  HCT 41.6  PLT  217  MCV 84.4  MCH 27.8  MCHC 32.9  RDW 13.0  LYMPHSABS 2.2  MONOABS 0.8  EOSABS 0.2  BASOSABS 0.0    Chemistries   Recent Labs Lab 06/04/15 1655  NA 139  K 3.9  CL 107  CO2 26  GLUCOSE 108*  BUN 14  CREATININE 1.21  CALCIUM 9.4  AST 16  ALT 18  ALKPHOS 84  BILITOT 0.9   ------------------------------------------------------------------------------------------------------------------ estimated creatinine clearance is 69.8 mL/min (by C-G formula based on Cr of 1.21). ------------------------------------------------------------------------------------------------------------------ No results for input(s): HGBA1C in the last 72 hours. ------------------------------------------------------------------------------------------------------------------ No results for input(s): CHOL, HDL, LDLCALC, TRIG, CHOLHDL, LDLDIRECT in the last 72 hours. ------------------------------------------------------------------------------------------------------------------ No  results for input(s): TSH, T4TOTAL, T3FREE, THYROIDAB in the last 72 hours.  Invalid input(s): FREET3 ------------------------------------------------------------------------------------------------------------------ No results for input(s): VITAMINB12, FOLATE, FERRITIN, TIBC, IRON, RETICCTPCT in the last 72 hours.  Coagulation profile No results for input(s): INR, PROTIME in the last 168 hours.  No results for input(s): DDIMER in the last 72 hours.  Cardiac Enzymes No results for input(s): CKMB, TROPONINI, MYOGLOBIN in the last 168 hours.  Invalid input(s): CK ------------------------------------------------------------------------------------------------------------------ Invalid input(s): POCBNP   CBG: No results for input(s): GLUCAP in the last 168 hours.       Assessment/Plan Suspected small bowel obstruction: Acute. Patient with complaints of progressively worsening abdominal pain and flatus. Physical  exam with positive bowel sounds and generalized tenderness. CT scan of the abdomen showed dilated mid and distal small bowel loops with Wall thickening is seen involving a loop of small bowel in the anterior lower abdomen. Differential includes small bowel obstruction versus enteritis. -Admitted to MedSurg bed for observation. Re-evaluate for need of surgical consultation -Bowel rest -Fentanyl  prn abdominal pain -Colace scheduled -Abdominal x-ray in a.m. -Physical therapy consult  -Encourage patient to get up and ambulate -Zofran prn nausea  Hypertension: Stable. Blood pressure 126/79. -Continue amlodipine, benazepril, and metoprolol   History of colon polyps: Last colonoscopy was on 08/25/2014 where 2 polyps were removed. Patient notes that he was told not to follow-up for 3-5 years.    Hemorrhoids: Stable. Patient with a history of hemorrhoid banding last performed 10/11/2014.   Hyperlipidemia: Stable -Continue pravastatin  BPH: Stable. Patient's creatinine appears to be within normal limits. -Continue Flomax  Obesity (BMI 30.0-34.9) -Encourage patient on need of weight loss   Code Status:   full Family Communication: bedside Disposition Plan: admit   Total time spent 55 minutes.Greater than 50% of this time was spent in counseling, explanation of diagnosis, planning of further management, and coordination of care  [facility] Pager [phone]  If 7PM-7AM, please contact night-coverage [email or web address] Password [email or web address] 06/05/2015, 1:48 AM

## 2015-06-05 NOTE — Evaluation (Signed)
Physical Therapy Evaluation Patient Details Name: [name] MRN: [id number] DOB: [birth year] Today's Date: 06/05/2015   History of Present Illness  Patient is a 69 yo male admitted 06/04/15 with abdominal pain.  Patient with SBO.    PMH:  HTN, back pain, anemia  Clinical Impression  Patient is independent with all mobility and gait.  No acute PT needs identified - PT will sign off.  Recommended patient ambulate in hallway.  Notified RN patient is independent with ambulation.    Follow Up Recommendations No PT follow up    Equipment Recommendations  None recommended by PT    Recommendations for Other Services       Precautions / Restrictions Precautions Precautions: None Restrictions Weight Bearing Restrictions: No      Mobility  Bed Mobility Overal bed mobility: Independent                Transfers Overall transfer level: Independent Equipment used: None                Ambulation/Gait Ambulation/Gait assistance: Independent Ambulation Distance (Feet): 900 Feet Assistive device: None Gait Pattern/deviations: WFL(Within Functional Limits)   Gait velocity interpretation: at or above normal speed for age/gender General Gait Details: Patient with good gait pattern, balance, and speed.  No assist needed.  Stairs            Wheelchair Mobility    Modified Rankin (Stroke Patients Only)       Balance Overall balance assessment: Independent                                           Pertinent Vitals/Pain Pain Assessment: 0-10 Pain Score: 6  Pain Location: Abdomen Pain Descriptors / Indicators: Discomfort;Pressure Pain Intervention(s): Monitored during session    Home Living Family/patient expects to be discharged to:: Private residence Living Arrangements: Spouse/significant other Available Help at Discharge: Family;Available 24 hours/day Type of Home: House Home Access: Stairs to enter Entrance Stairs-Rails:  Psychiatric nurse of Steps: 2 Home Layout: Two level;Bed/bath upstairs Home Equipment: None      Prior Function Level of Independence: Independent         Comments: Very active.  Patient and wife travel.     Hand Dominance        Extremity/Trunk Assessment   Upper Extremity Assessment: Overall WFL for tasks assessed           Lower Extremity Assessment: Overall WFL for tasks assessed      Cervical / Trunk Assessment: Normal (Abdomen distended)  Communication   Communication: No difficulties  Cognition Arousal/Alertness: Awake/alert Behavior During Therapy: WFL for tasks assessed/performed Overall Cognitive Status: Within Functional Limits for tasks assessed                      General Comments      Exercises        Assessment/Plan    PT Assessment Patent does not need any further PT services  PT Diagnosis Acute pain   PT Problem List    PT Treatment Interventions     PT Goals (Current goals can be found in the Care Plan section) Acute Rehab PT Goals PT Goal Formulation: All assessment and education complete, DC therapy    Frequency     Barriers to discharge        Co-evaluation  End of Session   Activity Tolerance: Patient tolerated treatment well Patient left: in bed;with call bell/phone within reach Nurse Communication: Mobility status (Safe to ambulate in hallway independently)    Functional Assessment Tool Used: Clinical judgement Functional Limitation: Mobility: Walking and moving around Mobility: Walking and Moving Around Current Status [phone]): 0 percent impaired, limited or restricted Mobility: Walking and Moving Around Goal Status [phone]): 0 percent impaired, limited or restricted Mobility: Walking and Moving Around Discharge Status [phone]): 0 percent impaired, limited or restricted    Time: 1228-1239 PT Time Calculation (min) (ACUTE ONLY): 11 min   Charges:   PT  Evaluation $Initial PT Evaluation Tier I: 1 Procedure     PT G Codes:   PT G-Codes **NOT FOR INPATIENT CLASS** Functional Assessment Tool Used: Clinical judgement Functional Limitation: Mobility: Walking and moving around Mobility: Walking and Moving Around Current Status [id number]): 0 percent impaired, limited or restricted Mobility: Walking and Moving Around Goal Status [phone]): 0 percent impaired, limited or restricted Mobility: Walking and Moving Around Discharge Status [id number]): 0 percent impaired, limited or restricted    Despina Pole 06/05/2015, 1:29 PM Carita Pian. Sanjuana Kava, [place] Pager [phone]

## 2015-06-05 NOTE — Plan of Care (Signed)
[name], is a 69 y.o. male, DOB - 10/20/1945, [id number]  Patient admitted few hrs ago for ileus, imaging D/E GI MD Dr Fuller Plan, outpt follow with Dr Carlean Purl, supportive Rx which has been initiated, no steroids or ABX, if better DC in am, if not call GI formally per Dr Fuller Plan.  Patient non toxic with stable exam, passing flatus, place on clears & B regimen.     Filed Vitals:   06/04/15 1635 06/04/15 1903 06/04/15 2237 06/05/15 0452  BP: 131/98 121/95 126/79 115/82  Pulse: 84 83 82 80  Temp: 98.4 F (36.9 C) 99.3 F (37.4 C) 98.1 F (36.7 C) 97.7 F (36.5 C)  TempSrc: Oral Oral Oral Oral  Resp: 18 16 18 18   Height: 5' 10.5" (1.791 m)  5' 10.5" (1.791 m)   Weight: 97.07 kg (214 lb)  97 kg (213 lb 13.5 oz)   SpO2: 100% 100% 99% 100%        Data Review   Micro Results No results found for this or any previous visit (from the past 240 hour(s)).  Radiology Reports Dg Abd 1 View  06/05/2015  CLINICAL DATA:  69 year old male with generalized abdominal pain for 4 days. Abnormal distal small bowel on CT. Initial encounter. EXAM: ABDOMEN - 1 VIEW COMPARISON:  CT Abdomen and Pelvis 06/04/2015. FINDINGS: Supine view at 0726 hours. Oral contrast administered yesterday now is present in the distal colon and rectum. Continued upper limits of normal to mildly enlarged gas-filled small bowel in the left and mid abdomen. Mild gaseous distension of the stomach persists. Abdominal visceral contours are stable. Stable visualized osseous structures. No definite pneumoperitoneum on this supine view. IMPRESSION: No bowel obstruction, oral contrast administered yesterday has reached the rectum. Stable mild gaseous distension of small bowel probably reflecting mild inflammatory ileus. Electronically Signed   By: Genevie Ann M.D.   On: 06/05/2015 07:55   Ct Abdomen Pelvis W Contrast  06/04/2015  CLINICAL DATA:  Generalized  abdominal pain and distention for 3 days. Diarrhea. EXAM: CT ABDOMEN AND PELVIS WITH CONTRAST TECHNIQUE: Multidetector CT imaging of the abdomen and pelvis was performed using the standard protocol following bolus administration of intravenous contrast. CONTRAST:  38mL OMNIPAQUE IOHEXOL 300 MG/ML SOLN, 75mL OMNIPAQUE IOHEXOL 300 MG/ML SOLN COMPARISON:  None. FINDINGS: Lower chest:  No acute findings. Hepatobiliary: Mild hepatic steatosis noted. Gallbladder is unremarkable. Pancreas: No mass, inflammatory changes, or other significant abnormality. Spleen: Within normal limits in size and appearance. Adrenals/Urinary Tract: Bilateral renal cysts noted. Tiny 2 mm nonobstructive right renal calculus seen. No evidence of ureteral calculi or hydronephrosis. Stomach/Bowel: Multiple dilated mid and distal small bowel loops are seen. Wall thickening is seen involving a loop of small bowel in the anterior lower abdomen. Adjacent soft tissue stranding seen in the mesenteric fat and small amount of free fluid noted in the pelvis. Normal appendix visualized. No evidence of colonic dilatation or wall thickening. Vascular/Lymphatic: No pathologically enlarged lymph nodes. No evidence of abdominal aortic aneurysm. Reproductive: No mass or other significant abnormality. Other: Small paraumbilical hernia containing only fat. No herniated bowel loops. Musculoskeletal:  No suspicious bone lesions identified. IMPRESSION: Mid and distal small bowel dilatation, with wall thickening involving a small bowel loop in the anterior lower abdomen. Differential diagnosis includes small bowel ileus versus obstruction, with associated enteritis. Small amount of free fluid in pelvis.  No evidence of abscess. Tiny nonobstructive right renal calculus. Electronically Signed   By: Earle Gell M.D.   On:  06/04/2015 18:36    CBC  Recent Labs Lab 06/04/15 1655  WBC 7.0  HGB 13.7  HCT 41.6  PLT 217  MCV 84.4  MCH 27.8  MCHC 32.9  RDW 13.0    LYMPHSABS 2.2  MONOABS 0.8  EOSABS 0.2  BASOSABS 0.0    Chemistries   Recent Labs Lab 06/04/15 1655 06/05/15 0447  NA 139 138  K 3.9 3.9  CL 107 111  CO2 26 23  GLUCOSE 108* 112*  BUN 14 9  CREATININE 1.21 1.13  CALCIUM 9.4 8.4*  AST 16 16  ALT 18 16*  ALKPHOS 84 71  BILITOT 0.9 1.0   ------------------------------------------------------------------------------------------------------------------ estimated creatinine clearance is 74.7 mL/min (by C-G formula based on Cr of 1.13). ------------------------------------------------------------------------------------------------------------------ No results for input(s): HGBA1C in the last 72 hours. ------------------------------------------------------------------------------------------------------------------ No results for input(s): CHOL, HDL, LDLCALC, TRIG, CHOLHDL, LDLDIRECT in the last 72 hours. ------------------------------------------------------------------------------------------------------------------ No results for input(s): TSH, T4TOTAL, T3FREE, THYROIDAB in the last 72 hours.  Invalid input(s): FREET3 ------------------------------------------------------------------------------------------------------------------ No results for input(s): VITAMINB12, FOLATE, FERRITIN, TIBC, IRON, RETICCTPCT in the last 72 hours.  Coagulation profile No results for input(s): INR, PROTIME in the last 168 hours.  No results for input(s): DDIMER in the last 72 hours.  Cardiac Enzymes No results for input(s): CKMB, TROPONINI, MYOGLOBIN in the last 168 hours.  Invalid input(s): CK ------------------------------------------------------------------------------------------------------------------ Invalid input(s): POCBNP    SINGH,PRASHANT K M.D on 06/05/2015 at 11:10 AM  Between 7am to 7pm - Pager - [phone], After 7pm go to [email or web address] - password [facility]  [facility]  - Office  [phone]

## 2015-06-06 ENCOUNTER — Ambulatory Visit: Payer: Medicare Other | Admitting: Internal Medicine

## 2015-06-06 ENCOUNTER — Encounter (HOSPITAL_COMMUNITY): Payer: Self-pay | Admitting: Physician Assistant

## 2015-06-06 DIAGNOSIS — R1084 Generalized abdominal pain: Secondary | ICD-10-CM

## 2015-06-06 DIAGNOSIS — R197 Diarrhea, unspecified: Secondary | ICD-10-CM

## 2015-06-06 DIAGNOSIS — K5669 Other intestinal obstruction: Secondary | ICD-10-CM

## 2015-06-06 DIAGNOSIS — R933 Abnormal findings on diagnostic imaging of other parts of digestive tract: Secondary | ICD-10-CM

## 2015-06-06 LAB — OCCULT BLOOD X 1 CARD TO LAB, STOOL: Fecal Occult Bld: POSITIVE — AB

## 2015-06-06 MED ORDER — SIMETHICONE 80 MG PO CHEW
80.0000 mg | CHEWABLE_TABLET | Freq: Four times a day (QID) | ORAL | Status: DC
  Administered 2015-06-06 – 2015-06-13 (×25): 80 mg via ORAL
  Filled 2015-06-06 (×26): qty 1

## 2015-06-06 NOTE — Consult Note (Signed)
[facility] Consult: 10:33 AM 06/06/2015  LOS: 2 days    Referring Provider: Mart Piggs, MD Primary Care Physician:  Penni Homans, MD Primary Gastroenterologist:  Silvano Rusk, MD     Reason for Consultation:  Watery brown stool, FOBT not determined.  Enteritis on CT   HPI: [name] is a 69 y.o. male.  Hx mild obesity.  Hyperlipidemia. Fatty liver.   07/2014 Colonoscopy.  For adenoma surveillance.  Small polyps (adenomas) removed from ascending, descending colon.  03/2012 Colonoscopy   + polyps (tubular adenoma), poor prep.  03/2011 Colonoscopy: 2 polyps removed (HP and adenomatous).  Poor prep.  02/2004 Colonoscopy: 12 mm polyp in transverse, 12 mm polyp in sigmoid: hyperplastic.   Int rrhoids.  1999 Colonoscopy: HP polyps.   Pt normally having his normal pattern of intermittent loose stools.  Last Friday developed waterry/non-bloody/brown diarrhea, bloating.  On Sunday/Monday: severe pain, greatest on left abdomen. Increased flatus and belching.   Came to ED on Monday 12/5:  LFTs and lipase normal. WBCs normal, not CT scan: fatty liver, mid/distal SB wall thickening and dilatation.  ? Ileus vs obstruction with associated enteritis.   Stool frequency has slowed, abdominal pain mostly gone but distress now from lots of bloating and cramps intermittently.  At most mild nausea.  No NSAIDs or excessive ETOH.   Not clear why but MD initiated po Ferrous Fumarate yesterday ( Hgb 13.7, MCV 84) at which point stools turned black.     Past Medical History  Diagnosis Date  . Vitamin D deficiency   . Back pain 10/10/2012  . Arthritis   . Hyperlipidemia   . Hypertension   . Obesity (BMI 30.0-34.9)   . Osteoporosis   . Anemia   . Nocturia 08/21/2013  . Hyperglycemia 08/21/2013  . Unspecified constipation  02/06/2014  . 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 Rectal bleeding 07/30/2014  . Anemia 08/14/2014  . Anxiety   . Hemorrhoids, internal, with bleeding 2005    internal and external. 2005 band ligation (dr Ferdinand Lango in [facility])  . Fatty liver 2005    seen on 2005 ultrasound,05/2015 CT.     Past Surgical History  Procedure Laterality Date  . Tonsillectomy  1957  . Fracture surgery Left 1989    leg  . Knee surgery Left 2010    arthroscopy, torn carilage  . Knee surgery Right 2012    arthroscopy  . Hemorrhoid banding  2005  . Colonoscopy  before 2005, 2012, 2013, 08/2014.     Prior to Admission medications   Medication Sig Start Date End Date Taking? Authorizing Provider  amLODipine-benazepril (LOTREL) 5-20 MG per capsule TAKE 1 CAPSULE DAILY 03/11/15  Yes Mosie Lukes, MD  LIVALO 2 MG TABS TAKE 1 TABLET DAILY 06/04/15  Yes Mosie Lukes, MD  metoprolol (LOPRESSOR) 50 MG tablet TAKE 1 TABLET DAILY 03/11/15  Yes Mosie Lukes, MD    Scheduled Meds: . amLODipine  5 mg Oral Daily  . benazepril  20 mg Oral Daily  . bisacodyl  10 mg Oral Daily  . clobetasol cream  1 application Topical BID  . docusate sodium  100 mg Oral BID  . ferrous fumarate  1 tablet Oral Daily   And  . folic acid  1 mg Oral Daily  . fluticasone  2 spray Each Nare Daily  . heparin  5,000 Units Subcutaneous 3 times per day  . metoprolol  50 mg Oral Daily  . multivitamin with minerals  1 tablet Oral Daily  . pravastatin  40 mg Oral q1800  . tamsulosin  0.4 mg Oral Daily  . triamcinolone ointment  1 application Topical BID   Infusions: . sodium chloride 100 mL/hr at 06/06/15 0531   PRN Meds: ALPRAZolam, fentaNYL (SUBLIMAZE) injection, iohexol, ondansetron **OR** ondansetron (ZOFRAN) IV, simethicone   Allergies as of 06/04/2015  . (No Known Allergies)    Family History  Problem Relation Age of Onset  . Stroke Mother   . Aneurysm Mother     brain  . Arthritis Father   . Liver  cancer Father     liver, atomic testing in [employer]  . Gallbladder disease Father   . Hyperlipidemia Sister   . Hypertension Sister   . Obesity Sister   . Diabetes Sister   . Diabetes Brother   . Hyperlipidemia Sister   . Hypertension Sister   . Obesity Sister   . Diabetes Sister   . Aneurysm Brother     brain  . COPD Brother   . Colon cancer Neg Hx   . Colon polyps Neg Hx   . Esophageal cancer Neg Hx   . Rectal cancer Neg Hx   . Stomach cancer Neg Hx   . Gallbladder disease Sister     x4    Social History   Social History  . Marital Status: Married    Spouse Name: N/A  . Number of Children: 1  . Years of Education: N/A   Occupational History  . Retired [employer]   Social History Main Topics  . Smoking status: Never Smoker   . Smokeless tobacco: Never Used  . Alcohol Use: 1.2 oz/week    2 Standard drinks or equivalent per week     Comment: Occassionally  . Drug Use: No  . Sexual Activity: No   Other Topics Concern  . Not on file   Social History Narrative    REVIEW OF SYSTEMS: Constitutional:  Generally active and good energy level ENT:  No nose bleeds Pulm:  No DOE or cough CV:  No palpitations, no LE edema.  GU:  No hematuria, no frequency GI:  Per HPI Heme:  No issues with anemia or low iron.    Transfusions:  None ever.  Neuro:  No headaches, no peripheral tingling or numbness Derm:  No itching, no rash or sores.  Endocrine:  No sweats or chills.  No polyuria or dysuria Immunization:  fluvax current;  All shots reviwed Travel:  None beyond local counties in last few months.    PHYSICAL EXAM: Vital signs in last 24 hours: Filed Vitals:   06/05/15 2124 06/06/15 0556  BP: 130/85 133/85  Pulse: 80 83  Temp: 99.3 F (37.4 C) 98.4 F (36.9 C)  Resp: 18 18   Wt Readings from Last 3 Encounters:  06/04/15 97 kg (213 lb 13.5 oz)  03/08/15 97.126 kg (214 lb 2 oz)  10/11/14 98.544 kg (217 lb 4 oz)    General: pleasant, looks well.   Slightly uncomfortable Head:  No swelling  or asymmetry  Eyes:  No pallor or icterus Ears:  Not HOH  Nose:  No discharge Mouth:  Moist and clear oral mm.  Neck:  No mass, no JVD, no TMG Lungs:  Clear bil.  No sob or cough Heart: RRR.  No mrg Abdomen:  Soft, ND.  Mild to moderate left sided tenderness.   Rectal: empty, no mass, no stool   Musc/Skeltl: no joint contractures or other deformities.  No erythema or redness Extremities:  No CCE  Neurologic:  Oriented x 3.  No tremor.  No limb weakness or gross deficits.  Skin:  No rash, sores.  Lots of "freckles" on his back.  Tattoos:  none Nodes:  No cervical or inguinal adenopathy.    Psych:  Pleasant, cooperative, calm.   Intake/Output from previous day: 12/06 0701 - 12/07 0700 In: 2892 [P.O.:1200; I.V.:1692] Out: 575 [Urine:575] Intake/Output this shift:    LAB RESULTS:  Recent Labs  06/04/15 1655  WBC 7.0  HGB 13.7  HCT 41.6  PLT 217   BMET Lab Results  Component Value Date   NA 138 06/05/2015   NA 139 06/04/2015   NA 140 08/07/2014   K 3.9 06/05/2015   K 3.9 06/04/2015   K 4.1 08/07/2014   CL 111 06/05/2015   CL 107 06/04/2015   CL 109 08/07/2014   CO2 23 06/05/2015   CO2 26 06/04/2015   CO2 27 08/07/2014   GLUCOSE 112* 06/05/2015   GLUCOSE 108* 06/04/2015   GLUCOSE 107* 08/07/2014   BUN 9 06/05/2015   BUN 14 06/04/2015   BUN 15 08/07/2014   CREATININE 1.13 06/05/2015   CREATININE 1.21 06/04/2015   CREATININE 1.19 08/07/2014   CALCIUM 8.4* 06/05/2015   CALCIUM 9.4 06/04/2015   CALCIUM 9.2 08/07/2014   LFT  Recent Labs  06/04/15 1655 06/05/15 0447  PROT 7.4 5.9*  ALBUMIN 4.1 3.2*  AST 16 16  ALT 18 16*  ALKPHOS 84 71  BILITOT 0.9 1.0   PT/INR No results found for: INR, PROTIME Hepatitis Panel No results for input(s): HEPBSAG, HCVAB, HEPAIGM, HEPBIGM in the last 72 hours. C-Diff No components found for: CDIFF Lipase     Component Value Date/Time   LIPASE 28 06/04/2015 1655    Drugs  of Abuse  No results found for: LABOPIA, COCAINSCRNUR, LABBENZ, AMPHETMU, THCU, LABBARB   RADIOLOGY STUDIES: Dg Abd 1 View  06/05/2015  CLINICAL DATA:  69 year old male with generalized abdominal pain for 4 days. Abnormal distal small bowel on CT. Initial encounter. EXAM: ABDOMEN - 1 VIEW COMPARISON:  CT Abdomen and Pelvis 06/04/2015. FINDINGS: Supine view at 0726 hours. Oral contrast administered yesterday now is present in the distal colon and rectum. Continued upper limits of normal to mildly enlarged gas-filled small bowel in the left and mid abdomen. Mild gaseous distension of the stomach persists. Abdominal visceral contours are stable. Stable visualized osseous structures. No definite pneumoperitoneum on this supine view. IMPRESSION: No bowel obstruction, oral contrast administered yesterday has reached the rectum. Stable mild gaseous distension of small bowel probably reflecting mild inflammatory ileus. Electronically Signed   By: Genevie Ann M.D.   On: 06/05/2015 07:55   Ct Abdomen Pelvis W Contrast  06/04/2015  CLINICAL DATA:  Generalized abdominal pain and distention for 3 days. Diarrhea. EXAM: CT ABDOMEN AND PELVIS WITH CONTRAST TECHNIQUE: Multidetector CT imaging of the abdomen and pelvis was performed using the standard protocol following bolus administration of intravenous contrast. CONTRAST:  45mL OMNIPAQUE IOHEXOL 300 MG/ML  SOLN, 43mL OMNIPAQUE IOHEXOL 300 MG/ML SOLN COMPARISON:  None. FINDINGS: Lower chest:  No acute findings. Hepatobiliary: Mild hepatic steatosis noted. Gallbladder is unremarkable. Pancreas: No mass, inflammatory changes, or other significant abnormality. Spleen: Within normal limits in size and appearance. Adrenals/Urinary Tract: Bilateral renal cysts noted. Tiny 2 mm nonobstructive right renal calculus seen. No evidence of ureteral calculi or hydronephrosis. Stomach/Bowel: Multiple dilated mid and distal small bowel loops are seen. Wall thickening is seen involving a loop of  small bowel in the anterior lower abdomen. Adjacent soft tissue stranding seen in the mesenteric fat and small amount of free fluid noted in the pelvis. Normal appendix visualized. No evidence of colonic dilatation or wall thickening. Vascular/Lymphatic: No pathologically enlarged lymph nodes. No evidence of abdominal aortic aneurysm. Reproductive: No mass or other significant abnormality. Other: Small paraumbilical hernia containing only fat. No herniated bowel loops. Musculoskeletal:  No suspicious bone lesions identified. IMPRESSION: Mid and distal small bowel dilatation, with wall thickening involving a small bowel loop in the anterior lower abdomen. Differential diagnosis includes small bowel ileus versus obstruction, with associated enteritis. Small amount of free fluid in pelvis.  No evidence of abscess. Tiny nonobstructive right renal calculus. Electronically Signed   By: Earle Gell M.D.   On: 06/04/2015 18:36    ENDOSCOPIC STUDIES: Per HPI  IMPRESSION:   *  SB enteritis. ? Inflammatory vs infectious vs ischemic.   *  Hx of recurrent HP and adenomatous colon polyps and hemorrhoids, up to date on surveillnce (07/2014)  *  Irregular heart rate.  Rule out a fib. EKG 07/2013: sinus brady, occasional PAC.     PLAN:     *  EKG to rule out Afib, which if present might support dx of ischemic injury to SB  *  Leave on clears. Since sxs improving, no need to repeat CT.    *  Change from prn to q 6 hour simethicone, has yet to receive a dose.  Consider adding hyoscyamine.    Azucena Freed  06/06/2015, 10:33 AM Pager: [phone]  Addendum: 11:45: EKG shows sinus rhythm with premature SVC complexes.      Attending physician's note   I have taken a history, examined the patient and reviewed the chart. I agree with the Advanced Practitioner's note, impression and recommendations. Worsening abd pain, abdominal distention and diarrhea over past 4 days. CT shows enteritis with ileus or partial  SBO  Enteritis possible infectious, inflammatory, less likely ischemic. Clear liquids, symptomatic mgmt and monitor for now. Stool cultures.  Lucio Edward, MD [facility] [phone] Mon-Fri 8a-5p [phone] after 5p, weekends, holidays

## 2015-06-06 NOTE — Progress Notes (Signed)
Patient ID: [name], male   DOB: Mar 29, 1946, 69 y.o.   MRN: [id number] [facility] PROGRESS NOTE  Emrys Starosta [id number] DOB: 02-19-1946 DOA: 06/04/2015 PCP: Penni Homans, MD   Brief narrative:    Patient is 69 year old male who presented to [facility] for evaluation of several days duration of watery stools, dark in color, epigastric pain, intermittent and throbbing, 3-4/10 in severity and nonradiating. Patient denies similar events in the past, no recent sick contacts or exposures. In the emergency department patient noted to be hemodynamically stable with T 99.4, vital signs otherwise stable, imaging studies concerning for questionable ileus versus enteritis.  Assessment/Plan:    Principal Problem:   Loose and dark stools, abdominal pain - Questionable enteritis - No antibiotics initiated as patient with no fever and white blood cell count is within normal limits - GI team consulted as imaging studies also concerning for possible small bowel ileus versus obstruction - Patient currently on clear liquid diets and so far tolerating well - Appreciate GI team assistance - FOBT requested  Active Problems:   Hyperlipidemia - Continue statin as per home medical regimen    Hypertension, Essential - Continue Norvasc, lisinopril, metoprolol as per home medical regimen     Obesity (BMI 30.0-34.9) - Body mass index is 30.24 kg/(m^2).  DVT prophylaxis - heparin SQ  Code Status: Full.  Family Communication:  plan of care discussed with the patient and wife at bedside Disposition Plan: Home when stable.   IV access:  Peripheral IV  Procedures and diagnostic studies:    Dg Abd 1 View 06/05/2015  No bowel obstruction, oral contrast administered yesterday has reached the rectum. Stable mild gaseous distension of small bowel probably reflecting mild inflammatory ileus.   Ct Abdomen Pelvis W Contrast 06/04/2015  Mid and distal small bowel dilatation, with wall thickening  involving a small bowel loop in the anterior lower abdomen. Differential diagnosis includes small bowel ileus versus obstruction, with associated enteritis. Small amount of free fluid in pelvis.  No evidence of abscess.   Medical Consultants:  GI  Other Consultants:  None  IAnti-Infectives:   None  [name], MD  [facility] Pager [phone]  If 7PM-7AM, please contact night-coverage [email or web address] Password [email or web address] 06/06/2015, 10:52 AM   LOS: 2 days   HPI/Subjective: No events overnight. Still with watery dark stools  Objective: Filed Vitals:   06/05/15 0452 06/05/15 1448 06/05/15 2124 06/06/15 0556  BP: 115/82 138/94 130/85 133/85  Pulse: 80 79 80 83  Temp: 97.7 F (36.5 C) 99.4 F (37.4 C) 99.3 F (37.4 C) 98.4 F (36.9 C)  TempSrc: Oral Oral Oral Oral  Resp: 18 18 18 18   Height:      Weight:      SpO2: 100% 100% 99% 99%    Intake/Output Summary (Last 24 hours) at 06/06/15 1052 Last data filed at 06/06/15 1045  Gross per 24 hour  Intake   3272 ml  Output    700 ml  Net   2572 ml    Exam:   General:  Pt is alert, follows commands appropriately, not in acute distress  Cardiovascular: Regular rate and rhythm, no rubs, no gallops  Respiratory: Clear to auscultation bilaterally, no wheezing, no crackles, no rhonchi  Abdomen: Soft, non tender, non distended, bowel sounds present, no guarding  Extremities: No edema, pulses DP and PT palpable bilaterally  Neuro: Grossly nonfocal  Data Reviewed: Basic Metabolic Panel:  Recent Labs Lab 06/04/15 1655 06/05/15 0447  NA 139 138  K 3.9 3.9  CL 107 111  CO2 26 23  GLUCOSE 108* 112*  BUN 14 9  CREATININE 1.21 1.13  CALCIUM 9.4 8.4*   Liver Function Tests:  Recent Labs Lab 06/04/15 1655 06/05/15 0447  AST 16 16  ALT 18 16*  ALKPHOS 84 71  BILITOT 0.9 1.0  PROT 7.4 5.9*  ALBUMIN 4.1 3.2*    Recent Labs Lab 06/04/15 1655  LIPASE 28   CBC:  Recent Labs Lab 06/04/15 1655  WBC 7.0   NEUTROABS 3.8  HGB 13.7  HCT 41.6  MCV 84.4  PLT 217   Scheduled Meds: . amLODipine  5 mg Oral Daily  . benazepril  20 mg Oral Daily  . bisacodyl  10 mg Oral Daily  . clobetasol cream  1 application Topical BID  . docusate sodium  100 mg Oral BID  . ferrous fumarate  1 tablet Oral Daily   And  . folic acid  1 mg Oral Daily  . fluticasone  2 spray Each Nare Daily  . heparin  5,000 Units Subcutaneous 3 times per day  . metoprolol  50 mg Oral Daily  . multivitamin with minerals  1 tablet Oral Daily  . pravastatin  40 mg Oral q1800  . tamsulosin  0.4 mg Oral Daily  . triamcinolone ointment  1 application Topical BID   Continuous Infusions: . sodium chloride 100 mL/hr at 06/06/15 0531

## 2015-06-07 ENCOUNTER — Other Ambulatory Visit: Payer: Self-pay | Admitting: Family Medicine

## 2015-06-07 ENCOUNTER — Inpatient Hospital Stay (HOSPITAL_COMMUNITY): Payer: Medicare Other

## 2015-06-07 ENCOUNTER — Encounter (HOSPITAL_COMMUNITY): Payer: Self-pay | Admitting: General Surgery

## 2015-06-07 DIAGNOSIS — R197 Diarrhea, unspecified: Secondary | ICD-10-CM | POA: Diagnosis present

## 2015-06-07 DIAGNOSIS — R195 Other fecal abnormalities: Secondary | ICD-10-CM | POA: Diagnosis present

## 2015-06-07 DIAGNOSIS — R109 Unspecified abdominal pain: Secondary | ICD-10-CM | POA: Diagnosis present

## 2015-06-07 LAB — CBC
HCT: 35.1 % — ABNORMAL LOW (ref 39.0–52.0)
Hemoglobin: 11.7 g/dL — ABNORMAL LOW (ref 13.0–17.0)
MCH: 28.1 pg (ref 26.0–34.0)
MCHC: 33.3 g/dL (ref 30.0–36.0)
MCV: 84.4 fL (ref 78.0–100.0)
Platelets: 180 10*3/uL (ref 150–400)
RBC: 4.16 MIL/uL — ABNORMAL LOW (ref 4.22–5.81)
RDW: 12.6 % (ref 11.5–15.5)
WBC: 5.2 10*3/uL (ref 4.0–10.5)

## 2015-06-07 LAB — BASIC METABOLIC PANEL
Anion gap: 8 (ref 5–15)
BUN: 5 mg/dL — ABNORMAL LOW (ref 6–20)
CO2: 24 mmol/L (ref 22–32)
Calcium: 8.6 mg/dL — ABNORMAL LOW (ref 8.9–10.3)
Chloride: 105 mmol/L (ref 101–111)
Creatinine, Ser: 0.98 mg/dL (ref 0.61–1.24)
GFR calc Af Amer: >60 mL/min (ref >60)
GFR calc non Af Amer: >60 mL/min (ref >60)
Glucose, Bld: 110 mg/dL — ABNORMAL HIGH (ref 65–99)
Potassium: 3.1 mmol/L — ABNORMAL LOW (ref 3.5–5.1)
Sodium: 137 mmol/L (ref 135–145)

## 2015-06-07 LAB — FECAL LACTOFERRIN, QUANT: Fecal Lactoferrin: POSITIVE

## 2015-06-07 MED ORDER — POTASSIUM CHLORIDE CRYS ER 20 MEQ PO TBCR
40.0000 meq | EXTENDED_RELEASE_TABLET | Freq: Once | ORAL | Status: AC
  Administered 2015-06-07: 40 meq via ORAL
  Filled 2015-06-07: qty 2

## 2015-06-07 MED ORDER — CIPROFLOXACIN IN D5W 400 MG/200ML IV SOLN
400.0000 mg | Freq: Two times a day (BID) | INTRAVENOUS | Status: DC
  Administered 2015-06-07 – 2015-06-12 (×10): 400 mg via INTRAVENOUS
  Filled 2015-06-07 (×11): qty 200

## 2015-06-07 MED ORDER — HYDROCORTISONE 2.5 % RE CREA
TOPICAL_CREAM | Freq: Three times a day (TID) | RECTAL | Status: AC
  Administered 2015-06-07: 18:00:00 via RECTAL
  Filled 2015-06-07 (×2): qty 28.35

## 2015-06-07 MED ORDER — METRONIDAZOLE IN NACL 5-0.79 MG/ML-% IV SOLN
500.0000 mg | Freq: Three times a day (TID) | INTRAVENOUS | Status: DC
  Administered 2015-06-07 – 2015-06-12 (×15): 500 mg via INTRAVENOUS
  Filled 2015-06-07 (×17): qty 100

## 2015-06-07 NOTE — Consult Note (Signed)
[name] 30-Apr-1946  [id number].   Primary Care MD: Dr. Penni Homans Requesting MD: Dr. Lucio Edward Chief Complaint/Reason for Consult: abdominal pain, psbo HPI: This is a pleasant 69 yo black male with a history of HTN and hypercholesterolemia who for the last several months has been having intermittent left sided abdominal pain generally after eating with some diarrhea within 30 minutes to 2 hours after eating.  This will happen every couple of weeks or so.  Last Friday he began to have some diarrhea, same with Saturday.  On Sunday, his diarrhea persisted, but he then developed some abdominal pain as well, particularly in his lower abdomen.  He denied nausea at that time.  He then developed significant abdominal distention on Monday with persistently worsening abdominal pain.  He came to the [facility] where he had a CT scan that revealed an abnormally thickened loop of small bowel c/w enteritis with reactive ileus or secondary PSBO.  He was admitted and GI has evaluated the patient as well.  He has been on clear liquids and not making much progress.  Today he was feeling well this morning until he started eating his liquids.  He then developed severe abdominal pain that was crampy in nature on the left side of his abdomen.  He has been belching a lot and feels very distended.  He has still passed some flatus and diarrhea.  We have been asked to see him due to a concern for possible ischemia.  ROS : Please see HPI, otherwise all other systems are currently negative  Family History  Problem Relation Age of Onset  . Stroke Mother   . Aneurysm Mother     brain  . Arthritis Father   . Liver cancer Father     liver, atomic testing in [employer]  . Gallbladder disease Father   . Hyperlipidemia Sister   . Hypertension Sister   . Obesity Sister   . Diabetes Sister   . Diabetes Brother   . Hyperlipidemia Sister   . Hypertension Sister   . Obesity Sister   . Diabetes Sister   . Aneurysm Brother      brain  . COPD Brother   . Colon cancer Neg Hx   . Colon polyps Neg Hx   . Esophageal cancer Neg Hx   . Rectal cancer Neg Hx   . Stomach cancer Neg Hx   . Gallbladder disease Sister     x4    Past Medical History  Diagnosis Date  . Vitamin D deficiency   . Back pain 10/10/2012  . Arthritis   . Hyperlipidemia   . Hypertension   . Obesity (BMI 30.0-34.9)   . Osteoporosis   . Anemia   . Nocturia 08/21/2013  . Hyperglycemia 08/21/2013  . Unspecified constipation 02/06/2014  . 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 Rectal bleeding 07/30/2014  . Anemia 08/14/2014  . Anxiety   . Hemorrhoids, internal, with bleeding 2005    internal and external. 2005 band ligation (dr Ferdinand Lango in [facility])  . Fatty liver 2005    seen on 2005 ultrasound,05/2015 CT.     Past Surgical History  Procedure Laterality Date  . Tonsillectomy  1957  . Fracture surgery Left 1989    leg  . Knee surgery Left 2010    arthroscopy, torn carilage  . Knee surgery Right 2012    arthroscopy  . Hemorrhoid banding  2005  . Colonoscopy  before 2005, 2012, 2013, 08/2014.  Social History:  reports that he has never smoked. He has never used smokeless tobacco. He reports that he drinks about 1.2 oz of alcohol per week. He reports that he does not use illicit drugs.  Allergies: No Known Allergies  Medications Prior to Admission  Medication Sig Dispense Refill  . LIVALO 2 MG TABS TAKE 1 TABLET DAILY 90 tablet 0    Blood pressure 136/85, pulse 80, temperature 98.8 F (37.1 C), temperature source Oral, resp. rate 18, height 5' 10.5" (1.791 m), weight 97 kg (213 lb 13.5 oz), SpO2 100 %. Physical Exam: General: pleasant, obese black male who is laying in bed in NAD HEENT: head is normocephalic, atraumatic.  Sclera are noninjected.  PERRL.  Ears and nose without any masses or lesions.  Mouth is pink and moist Heart: regular, rate, and rhythm.  Normal s1,s2. No obvious murmurs, gallops, or rubs  noted.  Palpable radial and pedal pulses bilaterally Lungs: CTAB, no wheezes, rhonchi, or rales noted.  Respiratory effort nonlabored Abd: soft, but distended, initially very tender particularly on the left side of his abdomen; however by the end of the conversation on re-examination he was much less tender and stated his pain was much better.  He was tympanetic. Somewhat hypoactive BS, no masses or organomegaly.  Small reducible umbilical hernia MS: all 4 extremities are symmetrical with no cyanosis, clubbing, or edema. Skin: warm and dry with no masses, lesions, or rashes Psych: A&Ox3 with an appropriate affect.    Results for orders placed or performed during the hospital encounter of 06/04/15 (from the past 48 hour(s))  Occult blood card to lab, stool RN will collect     Status: Abnormal   Collection Time: 06/06/15 12:35 PM  Result Value Ref Range   Fecal Occult Bld POSITIVE (A) NEGATIVE  Fecal lactoferrin     Status: None   Collection Time: 06/06/15  2:03 PM  Result Value Ref Range   Specimen Description STOOL    Special Requests NONE    Fecal Lactoferrin POSITIVE Performed at [facility]     Report Status 06/07/2015 FINAL   CBC     Status: Abnormal   Collection Time: 06/07/15  5:10 AM  Result Value Ref Range   WBC 5.2 4.0 - 10.5 K/uL   RBC 4.16 (L) 4.22 - 5.81 MIL/uL   Hemoglobin 11.7 (L) 13.0 - 17.0 g/dL   HCT 35.1 (L) 39.0 - 52.0 %   MCV 84.4 78.0 - 100.0 fL   MCH 28.1 26.0 - 34.0 pg   MCHC 33.3 30.0 - 36.0 g/dL   RDW 12.6 11.5 - 15.5 %   Platelets 180 150 - 400 K/uL  Basic metabolic panel     Status: Abnormal   Collection Time: 06/07/15  5:10 AM  Result Value Ref Range   Sodium 137 135 - 145 mmol/L   Potassium 3.1 (L) 3.5 - 5.1 mmol/L   Chloride 105 101 - 111 mmol/L   CO2 24 22 - 32 mmol/L   Glucose, Bld 110 (H) 65 - 99 mg/dL   BUN 5 (L) 6 - 20 mg/dL   Creatinine, Ser 0.98 0.61 - 1.24 mg/dL   Calcium 8.6 (L) 8.9 - 10.3 mg/dL   GFR calc non Af Amer >60  >60 mL/min   GFR calc Af Amer >60 >60 mL/min    Comment: (NOTE) The eGFR has been calculated using the CKD EPI equation. This calculation has not been validated in all clinical situations. eGFR's persistently <60 mL/min  signify possible Chronic Kidney Disease.    Anion gap 8 5 - 15   Dg Abd Portable 2v  06/07/2015  CLINICAL DATA:  69 year old male with abdominal pain for 5 days. Diarrhea prior to coming to the hospital. Hypertension. Insert follow-up EXAM: PORTABLE ABDOMEN - 2 VIEW COMPARISON:  06/15/2015 plain film exam and 06/04/2015 CT. FINDINGS: Gas distended small bowel loops measuring up to 4.2 cm versus prior 3.9 cm suggestive of partial small bowel obstruction with gas seen within portions of colon. No free intraperitoneal air detected on decubitus view. IMPRESSION: Partial small bowel obstruction suspected as noted above. Electronically Signed   By: Genia Del M.D.   On: 06/07/2015 12:06       Assessment/Plan 1. PSBO, ? Etiology -I don't think the patient has ischemic bowel causing his pain or partial obstruction. Usually the pain is not intermittent like his is.  It is usually more constantly in nature.  I suspect his is crampy from peristaltic pain.  He may have some type of enteritis that is causing a reactive ileus vs a partial small bowel obstruction.  He is belching a lot and tympanetic.  Repeat abdominal films do not show a huge stomach, but his small is more dilated today.  He does have some air in his colon.  I have made him NPO, but will d/w Dr. Hulen Skains placement of an NGT.  I do not think he has a surgical abdomen at this time. -we will continue to follow along with you.  Sanjit Mcmichael E 06/07/2015, 12:25 PM Pager: [phone]

## 2015-06-07 NOTE — Progress Notes (Signed)
Daily Rounding Note  06/07/2015, 8:48 AM  LOS: 3 days   SUBJECTIVE:       After consuming clear liquid tray this AM pain rapidly intensified.   Stools still watery and brown but has been seeing some blood he attributes to hemorrhoids.    OBJECTIVE:         Vital signs in last 24 hours:    Temp:  [98.8 F (37.1 C)-98.9 F (37.2 C)] 98.8 F (37.1 C) (12/08 0510) Pulse Rate:  [80-86] 80 (12/08 0510) Resp:  [18] 18 (12/08 0510) BP: (136-142)/(85-93) 136/85 mmHg (12/08 0510) SpO2:  [100 %] 100 % (12/08 0510) Last BM Date: 06/06/15 Filed Weights   06/04/15 1635 06/04/15 2237  Weight: 97.07 kg (214 lb) 97 kg (213 lb 13.5 oz)   General: uncomfortable, in pain.     Heart: RRR Chest: clear bil.   Abdomen: tender diffusely with some guarding.  BS active.   Extremities: no CCE Neuro/Psych:  Pleasant, alert, oriented x 3.    Intake/Output from previous day: 12/07 0701 - 12/08 0700 In: 2190 [P.O.:1090; I.V.:1100] Out: 1000 [Urine:1000]  Intake/Output this shift:    Lab Results:  Recent Labs  06/04/15 1655 06/07/15 0510  WBC 7.0 5.2  HGB 13.7 11.7*  HCT 41.6 35.1*  PLT 217 180   BMET  Recent Labs  06/04/15 1655 06/05/15 0447 06/07/15 0510  NA 139 138 137  K 3.9 3.9 3.1*  CL 107 111 105  CO2 26 23 24   GLUCOSE 108* 112* 110*  BUN 14 9 5*  CREATININE 1.21 1.13 0.98  CALCIUM 9.4 8.4* 8.6*   LFT  Recent Labs  06/04/15 1655 06/05/15 0447  PROT 7.4 5.9*  ALBUMIN 4.1 3.2*  AST 16 16  ALT 18 16*  ALKPHOS 84 71  BILITOT 0.9 1.0   PT/INR No results for input(s): LABPROT, INR in the last 72 hours. Hepatitis Panel No results for input(s): HEPBSAG, HCVAB, HEPAIGM, HEPBIGM in the last 72 hours.  Studies/Results: No results found.   Scheduled Meds: . amLODipine  5 mg Oral Daily  . benazepril  20 mg Oral Daily  . bisacodyl  10 mg Oral Daily  . clobetasol cream  1 application Topical BID  .  docusate sodium  100 mg Oral BID  . fluticasone  2 spray Each Nare Daily  . heparin  5,000 Units Subcutaneous 3 times per day  . metoprolol  50 mg Oral Daily  . multivitamin with minerals  1 tablet Oral Daily  . pravastatin  40 mg Oral q1800  . simethicone  80 mg Oral QID  . tamsulosin  0.4 mg Oral Daily  . triamcinolone ointment  1 application Topical BID   Continuous Infusions: . sodium chloride 50 mL/hr at 06/06/15 1704   PRN Meds:.ALPRAZolam, fentaNYL (SUBLIMAZE) injection, iohexol, ondansetron **OR** ondansetron (ZOFRAN) IV   ASSESMENT:   * SB enteritis. ? Inflammatory vs infectious vs ischemic. + fecal lactoferrin, stool path panel pending.  Note ongoing Dulcolax and colace.  Pain is worse today after clears.  Given level of pain, Ischemia is suspected.  Less suspicious of infection.  WBCs normal.   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   *  Normocytic anemia post rehydration.    *  Arrhythmia.  Premature supraventricular complexes on EKG     PLAN   *  2 view  abd films.   ? Repeat CT if xray unrevealing, ? gen surg consult. Sips clears only.   *  Potassium supplement per hospitalist.  Added Anusol cream.  Stop stool softener and laxative.   *  Wonder if we should hold Benazapril in setting of little po intake?Marland Kitchen  Urine output is reduced (400, 600, and 1000 ml daily in last 3 days) though BUN/creatinine ok. Still on Norvasc and Metoprolol.  I went ahead and dc'd this, hospitalist can decide if they want this to restart.     Azucena Freed  06/07/2015, 8:48 AM Pager: [phone]     Attending physician's note   I have taken an interval history, reviewed the chart and examined the patient. I agree with the Advanced Practitioner's note, impression and recommendations. Partial SBO appears to be worsening. Possible enteritis. Start IV antibiotics however this does not appear to be  infectious-WBC normal and no fever. Stool studies pending. NPO and consider NGT-defer decision to surgical consult. Consider repeat CT scan.  Lucio Edward, MD [facility] [phone] Mon-Fri 8a-5p [phone] after 5p, weekends, holidays

## 2015-06-07 NOTE — Care Management Note (Signed)
Case Management Note  Patient Details  Name: [name] MRN: [id number] Date of Birth: Oct 29, 1945  Subjective/Objective:                    Action/Plan:  UR updated  Expected Discharge Date:                  Expected Discharge Plan:  Home/Self Care  In-House Referral:     Discharge planning Services     Post Acute Care Choice:    Choice offered to:     DME Arranged:    DME Agency:     HH Arranged:    [place] Agency:     Status of Service:  In process, will continue to follow  Medicare Important Message Given:    Date Medicare IM Given:    Medicare IM give by:    Date Additional Medicare IM Given:    Additional Medicare Important Message give by:     If discussed at [place] of Stay Meetings, dates discussed:    Additional Comments:  Marilu Favre, RN 06/07/2015, 1:53 PM

## 2015-06-07 NOTE — Progress Notes (Addendum)
Patient ID: [name], male   DOB: 1945/07/11, 69 y.o.   MRN: [id number] [facility] PROGRESS NOTE  [name] [id number] DOB: 1945/12/08 DOA: 06/04/2015 PCP: Penni Homans, MD   Brief narrative:    Patient is 69 year old male who presented to [facility] for evaluation of several days duration of watery stools, dark in color, epigastric pain, intermittent and throbbing, 3-4/10 in severity and nonradiating. Patient denies similar events in the past, no recent sick contacts or exposures. In the emergency department patient noted to be hemodynamically stable with T 99.4, vital signs otherwise stable, imaging studies concerning for questionable ileus versus enteritis.  Assessment/Plan:    Principal Problem:   Loose and dark stools, abdominal pain - more distended this AM - Questionable enteritis and ? SBO - abd imaging this AM - Patient currently on clear liquid diets and has more abd pain - Appreciate GI team assistance, surgery consulted   Active Problems:   Hypokalemia - supplement and repeat BMP In AM    Hyperlipidemia - Continue statin as per home medical regimen    Hypertension, Essential - Continue Norvasc, lisinopril, metoprolol as per home medical regimen     Obesity (BMI 30.0-34.9) - Body mass index is 30.24 kg/(m^2).  DVT prophylaxis - heparin SQ  Code Status: Full.  Family Communication:  plan of care discussed with the patient and wife at bedside Disposition Plan: Not ready for d/c as pt still with loose stools and more abd distension, surgery and GI team following   IV access:  Peripheral IV  Procedures and diagnostic studies:    Dg Abd 1 View 06/05/2015  No bowel obstruction, oral contrast administered yesterday has reached the rectum. Stable mild gaseous distension of small bowel probably reflecting mild inflammatory ileus.   Ct Abdomen Pelvis W Contrast 06/04/2015  Mid and distal small bowel dilatation, with wall thickening involving a small  bowel loop in the anterior lower abdomen. Differential diagnosis includes small bowel ileus versus obstruction, with associated enteritis. Small amount of free fluid in pelvis.  No evidence of abscess.   Medical Consultants:  GI Surgery   Other Consultants:  None  IAnti-Infectives:   None  [name], MD  [facility] Pager [phone]  If 7PM-7AM, please contact night-coverage [email or web address] Password [facility] 06/07/2015, 9:51 AM   LOS: 3 days   HPI/Subjective: No events overnight. Still with watery dark stools  Objective: Filed Vitals:   06/05/15 2124 06/06/15 0556 06/06/15 2145 06/07/15 0510  BP: 130/85 133/85 142/93 136/85  Pulse: 80 83 86 80  Temp: 99.3 F (37.4 C) 98.4 F (36.9 C) 98.9 F (37.2 C) 98.8 F (37.1 C)  TempSrc: Oral Oral Oral Oral  Resp: 18 18 18 18   Height:      Weight:      SpO2: 99% 99% 100% 100%    Intake/Output Summary (Last 24 hours) at 06/07/15 0951 Last data filed at 06/07/15 0934  Gross per 24 hour  Intake   2540 ml  Output   1000 ml  Net   1540 ml    Exam:   General:  Pt is alert, follows commands appropriately, not in acute distress  Cardiovascular: Regular rate and rhythm, no rubs, no gallops  Respiratory: Clear to auscultation bilaterally, no wheezing, no crackles, no rhonchi  Abdomen: Soft, non tender, slightly distended, bowel sounds present, no guarding   Data Reviewed: Basic Metabolic Panel:  Recent Labs Lab 06/04/15 1655 06/05/15 0447 06/07/15 0510  NA 139 138 137  K 3.9 3.9  3.1*  CL 107 111 105  CO2 26 23 24   GLUCOSE 108* 112* 110*  BUN 14 9 5*  CREATININE 1.21 1.13 0.98  CALCIUM 9.4 8.4* 8.6*   Liver Function Tests:  Recent Labs Lab 06/04/15 1655 06/05/15 0447  AST 16 16  ALT 18 16*  ALKPHOS 84 71  BILITOT 0.9 1.0  PROT 7.4 5.9*  ALBUMIN 4.1 3.2*    Recent Labs Lab 06/04/15 1655  LIPASE 28   CBC:  Recent Labs Lab 06/04/15 1655 06/07/15 0510  WBC 7.0 5.2  NEUTROABS 3.8  --   HGB 13.7  11.7*  HCT 41.6 35.1*  MCV 84.4 84.4  PLT 217 180   Scheduled Meds: . amLODipine  5 mg Oral Daily  . clobetasol cream  1 application Topical BID  . fluticasone  2 spray Each Nare Daily  . heparin  5,000 Units Subcutaneous 3 times per day  . hydrocortisone   Rectal TID  . metoprolol  50 mg Oral Daily  . multivitamin with minerals  1 tablet Oral Daily  . pravastatin  40 mg Oral q1800  . simethicone  80 mg Oral QID  . tamsulosin  0.4 mg Oral Daily  . triamcinolone ointment  1 application Topical BID   Continuous Infusions: . sodium chloride 50 mL/hr at 06/06/15 1704

## 2015-06-08 DIAGNOSIS — K529 Noninfective gastroenteritis and colitis, unspecified: Secondary | ICD-10-CM

## 2015-06-08 DIAGNOSIS — A09 Infectious gastroenteritis and colitis, unspecified: Secondary | ICD-10-CM

## 2015-06-08 DIAGNOSIS — R1013 Epigastric pain: Secondary | ICD-10-CM

## 2015-06-08 DIAGNOSIS — R195 Other fecal abnormalities: Secondary | ICD-10-CM

## 2015-06-08 LAB — CBC
HCT: 34.1 % — ABNORMAL LOW (ref 39.0–52.0)
Hemoglobin: 11.3 g/dL — ABNORMAL LOW (ref 13.0–17.0)
MCH: 28 pg (ref 26.0–34.0)
MCHC: 33.1 g/dL (ref 30.0–36.0)
MCV: 84.4 fL (ref 78.0–100.0)
Platelets: 175 10*3/uL (ref 150–400)
RBC: 4.04 MIL/uL — ABNORMAL LOW (ref 4.22–5.81)
RDW: 12.5 % (ref 11.5–15.5)
WBC: 5 10*3/uL (ref 4.0–10.5)

## 2015-06-08 LAB — BASIC METABOLIC PANEL
Anion gap: 8 (ref 5–15)
BUN: 8 mg/dL (ref 6–20)
CO2: 27 mmol/L (ref 22–32)
Calcium: 8.4 mg/dL — ABNORMAL LOW (ref 8.9–10.3)
Chloride: 102 mmol/L (ref 101–111)
Creatinine, Ser: 1.01 mg/dL (ref 0.61–1.24)
GFR calc Af Amer: >60 mL/min (ref >60)
GFR calc non Af Amer: >60 mL/min (ref >60)
Glucose, Bld: 93 mg/dL (ref 65–99)
Potassium: 3.1 mmol/L — ABNORMAL LOW (ref 3.5–5.1)
Sodium: 137 mmol/L (ref 135–145)

## 2015-06-08 LAB — OVA + PARASITE EXAM

## 2015-06-08 LAB — GI PATHOGEN PANEL BY PCR, STOOL
C difficile toxin A/B: NOT DETECTED
Campylobacter by PCR: NOT DETECTED
Cryptosporidium by PCR: NOT DETECTED
E coli (ETEC) LT/ST: NOT DETECTED
E coli (STEC): NOT DETECTED
E coli 0157 by PCR: NOT DETECTED
G lamblia by PCR: NOT DETECTED
Norovirus GI/GII: NOT DETECTED
Rotavirus A by PCR: NOT DETECTED
Salmonella by PCR: NOT DETECTED
Shigella by PCR: NOT DETECTED

## 2015-06-08 LAB — O&P RESULT

## 2015-06-08 MED ORDER — PANTOPRAZOLE SODIUM 40 MG IV SOLR
40.0000 mg | INTRAVENOUS | Status: DC
  Administered 2015-06-08 – 2015-06-11 (×4): 40 mg via INTRAVENOUS
  Filled 2015-06-08 (×4): qty 40

## 2015-06-08 MED ORDER — PHENOL 1.4 % MT LIQD
1.0000 | OROMUCOSAL | Status: DC | PRN
  Filled 2015-06-08: qty 177

## 2015-06-08 MED ORDER — POTASSIUM CHLORIDE 10 MEQ/100ML IV SOLN
10.0000 meq | INTRAVENOUS | Status: AC
  Administered 2015-06-08 (×4): 10 meq via INTRAVENOUS
  Filled 2015-06-08 (×4): qty 100

## 2015-06-08 NOTE — Progress Notes (Signed)
Daily Rounding Note  06/08/2015, 9:10 AM  LOS: 4 days   SUBJECTIVE:       NGT placed yest afternoon. 800 plus ml output thus far.  Dark, bilious with som reddish blood.  One loose, small, non-bloody BM this AM.  Overall diarrhea much improved.  Abd pain resolved after NGT placed.   OBJECTIVE:         Vital signs in last 24 hours:    Temp:  [98.1 F (36.7 C)-98.9 F (37.2 C)] 98.3 F (36.8 C) (12/09 0445) Pulse Rate:  [74-79] 79 (12/09 0445) Resp:  [18-19] 18 (12/09 0445) BP: (126-150)/(86-98) 126/86 mmHg (12/09 0445) SpO2:  [100 %] 100 % (12/09 0445) Last BM Date: 06/07/15 Filed Weights   06/04/15 1635 06/04/15 2237  Weight: 97.07 kg (214 lb) 97 kg (213 lb 13.5 oz)   General: pleasant, comfortable, looks well except for NGT being present   Heart: RRR Chest: clear bil.   Abdomen: soft, NT, slight protuberance.  BS present.  No tympanitic or tinkling BS  Extremities: no CCE Neuro/Psych:  Oriented x 3.  Alert.  No gross deficits.   Intake/Output from previous day: 12/08 0701 - 12/09 0700 In: 350 [P.O.:350] Out: 550 [Urine:550]  Intake/Output this shift: Total I/O In: -  Out: 800 [Emesis/NG output:800]  Lab Results:  Recent Labs  06/07/15 0510 06/08/15 0548  WBC 5.2 5.0  HGB 11.7* 11.3*  HCT 35.1* 34.1*  PLT 180 175   BMET  Recent Labs  06/07/15 0510 06/08/15 0548  NA 137 137  K 3.1* 3.1*  CL 105 102  CO2 24 27  GLUCOSE 110* 93  BUN 5* 8  CREATININE 0.98 1.01  CALCIUM 8.6* 8.4*   LFT No results for input(s): PROT, ALBUMIN, AST, ALT, ALKPHOS, BILITOT, BILIDIR, IBILI in the last 72 hours. PT/INR No results for input(s): LABPROT, INR in the last 72 hours. Hepatitis Panel No results for input(s): HEPBSAG, HCVAB, HEPAIGM, HEPBIGM in the last 72 hours.  Studies/Results: Dg Abd Portable 2v  06/07/2015  CLINICAL DATA:  69 year old male with abdominal pain for 5 days. Diarrhea prior to coming  to the hospital. Hypertension. Insert follow-up EXAM: PORTABLE ABDOMEN - 2 VIEW COMPARISON:  06/15/2015 plain film exam and 06/04/2015 CT. FINDINGS: Gas distended small bowel loops measuring up to 4.2 cm versus prior 3.9 cm suggestive of partial small bowel obstruction with gas seen within portions of colon. No free intraperitoneal air detected on decubitus view. IMPRESSION: Partial small bowel obstruction suspected as noted above. Electronically Signed   By: Genia Del M.D.   On: 06/07/2015 12:06   Scheduled Meds: . amLODipine  5 mg Oral Daily  . ciprofloxacin  400 mg Intravenous Q12H  . clobetasol cream  1 application Topical BID  . fluticasone  2 spray Each Nare Daily  . heparin  5,000 Units Subcutaneous 3 times per day  . hydrocortisone   Rectal TID  . metoprolol  50 mg Oral Daily  . metronidazole  500 mg Intravenous Q8H  . multivitamin with minerals  1 tablet Oral Daily  . potassium chloride  10 mEq Intravenous Q1 Hr x 4  . pravastatin  40 mg Oral q1800  . simethicone  80 mg Oral QID  . tamsulosin  0.4 mg Oral Daily  . triamcinolone ointment  1 application Topical BID   Continuous Infusions: . sodium chloride 50 mL/hr at 06/08/15 0540   PRN Meds:.ALPRAZolam, fentaNYL (SUBLIMAZE) injection, iohexol, ondansetron **  OR** ondansetron (ZOFRAN) IV   ASSESMENT:   * SB enteritis with PSBO ? inflammatory vs infectious vs ischemic. + fecal lactoferrin, stool path panel pending.  Dulcolax, colace, po iron discontinued.  Pain improved after NGT placed.  Some blood tinge to NGT output, ? NGT trauma vs PUD.   Day 2 Cipro/flagyl.  WBCs consistently normal.   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 Persists despite po supplementation yesterday   PLAN   *  Start once daily PPI.  NGT mgmt per gen surgery.      Azucena Freed  06/08/2015, 9:10 AM Pager: [phone]     Attending physician's note   I have  taken an interval history, reviewed the chart and examined the patient. I agree with the Advanced Practitioner's note, impression and recommendations. SBO improved with NG suction. GI pathogen panel and O&P are negative. Continue current mgmt with NG suction, IV antibiotics.   Lucio Edward, MD [facility] [phone] Mon-Fri 8a-5p [phone] after 5p, weekends, holidays

## 2015-06-08 NOTE — Progress Notes (Signed)
[facility] Progress Note     Subjective: Pt says his pain is nearly resolved, still sore in LLQ.  No N/V, ambulating well.  Urinating well.  Had a small BM this am.  On precautions.  Wife at bedside.  NG put out 860mL/24hr.    Objective: Vital signs in last 24 hours: Temp:  [98.1 F (36.7 C)-98.9 F (37.2 C)] 98.3 F (36.8 C) (12/09 0445) Pulse Rate:  [74-79] 79 (12/09 0445) Resp:  [18-19] 18 (12/09 0445) BP: (126-150)/(86-98) 126/86 mmHg (12/09 0445) SpO2:  [100 %] 100 % (12/09 0445) Last BM Date: 06/07/15  Intake/Output from previous day: 12/08 0701 - 12/09 0700 In: 350 [P.O.:350] Out: 550 [Urine:550] Intake/Output this shift: Total I/O In: -  Out: 800 [Emesis/NG output:800]  PE: Gen:  Alert, NAD, pleasant Abd: Soft, mild distension, NT to palp, few BS, no HSM, no abdominal scars noted   Lab Results:   Recent Labs  06/07/15 0510 06/08/15 0548  WBC 5.2 5.0  HGB 11.7* 11.3*  HCT 35.1* 34.1*  PLT 180 175   BMET  Recent Labs  06/07/15 0510  NA 137  K 3.1*  CL 105  CO2 24  GLUCOSE 110*  BUN 5*  CREATININE 0.98  CALCIUM 8.6*   PT/INR No results for input(s): LABPROT, INR in the last 72 hours. CMP     Component Value Date/Time   NA 137 06/07/2015 0510   K 3.1* 06/07/2015 0510   CL 105 06/07/2015 0510   CO2 24 06/07/2015 0510   GLUCOSE 110* 06/07/2015 0510   BUN 5* 06/07/2015 0510   CREATININE 0.98 06/07/2015 0510   CREATININE 1.15 02/06/2014 0816   CALCIUM 8.6* 06/07/2015 0510   PROT 5.9* 06/05/2015 0447   ALBUMIN 3.2* 06/05/2015 0447   AST 16 06/05/2015 0447   ALT 16* 06/05/2015 0447   ALKPHOS 71 06/05/2015 0447   BILITOT 1.0 06/05/2015 0447   GFRNONAA >60 06/07/2015 0510   GFRAA >60 06/07/2015 0510   Lipase     Component Value Date/Time   LIPASE 28 06/04/2015 1655       Studies/Results: Dg Abd Portable 2v  06/07/2015  CLINICAL DATA:  69 year old male with abdominal pain for 5 days. Diarrhea prior to coming to the  hospital. Hypertension. Insert follow-up EXAM: PORTABLE ABDOMEN - 2 VIEW COMPARISON:  06/15/2015 plain film exam and 06/04/2015 CT. FINDINGS: Gas distended small bowel loops measuring up to 4.2 cm versus prior 3.9 cm suggestive of partial small bowel obstruction with gas seen within portions of colon. No free intraperitoneal air detected on decubitus view. IMPRESSION: Partial small bowel obstruction suspected as noted above. Electronically Signed   By: Genia Del M.D.   On: 06/07/2015 12:06    Anti-infectives: Anti-infectives    Start     Dose/Rate Route Frequency Ordered Stop   06/07/15 1345  ciprofloxacin (CIPRO) IVPB 400 mg     400 mg 200 mL/hr over 60 Minutes Intravenous Every 12 hours 06/07/15 1301     06/07/15 1345  metroNIDAZOLE (FLAGYL) IVPB 500 mg     500 mg 100 mL/hr over 60 Minutes Intravenous Every 8 hours 06/07/15 1301         Assessment/Plan PSBO, ? enteritis LLQ abdominal pain -I don't think the patient has ischemic bowel causing his pain or partial obstruction. He may have some type of enteritis that is causing a reactive ileus vs a partial small bowel obstruction. He is improving quickly.   -NPO, NG tube,  IVF, pain  control, antiemetics -Ambulate and IS -SCD's and heparin -Consider SB protocol, but patient is improving quite a bit, may not be needed. Hypokalemia - will supplement    LOS: 4 days    Nat Christen 06/08/2015, 7:41 AM Pager: [phone]

## 2015-06-08 NOTE — Progress Notes (Signed)
Patient ID: [name], male   DOB: [birth year], 69 y.o.   MRN: [id number] [facility] PROGRESS NOTE  [name] [id number] DOB: [birth year] DOA: 06/04/2015 PCP: Penni Homans, MD   Brief narrative:    Patient is 69 year old male who presented to [facility] for evaluation of several days duration of watery stools, dark in color, epigastric pain, intermittent and throbbing, 3-4/10 in severity and nonradiating. Patient denies similar events in the past, no recent sick contacts or exposures. In the emergency department patient noted to be hemodynamically stable with T 99.4, vital signs otherwise stable, imaging studies concerning for questionable ileus versus enteritis.  Assessment/Plan:    Principal Problem:   Loose and dark stools, abdominal pain - SB enteritis with PSBO ? Inflammatory vs infectious vs ischemic. + fecal lactoferrin - stool path panel pending.  - Pain improved after NGT placed - Day# 2 Cipro and Flagyl   Active Problems:   Hypokalemia - still low, continue to supplement and repeat BMP In AM - check mg level as well     Hyperlipidemia - Continue statin as per home medical regimen    Hypertension, Essential - Continue Norvasc, lisinopril, metoprolol as per home medical regimen     Obesity (BMI 30.0-34.9) - Body mass index is 30.24 kg/(m^2).  DVT prophylaxis - heparin SQ  Code Status: Full.  Family Communication:  plan of care discussed with the patient and wife at bedside Disposition Plan: Not ready for d/c as pt still with loose stools, surgery and GI team following   IV access:  Peripheral IV  Procedures and diagnostic studies:    Dg Abd 1 View 06/05/2015  No bowel obstruction, oral contrast administered yesterday has reached the rectum. Stable mild gaseous distension of small bowel probably reflecting mild inflammatory ileus.   Ct Abdomen Pelvis W Contrast 06/04/2015  Mid and distal small bowel dilatation, with wall thickening involving a  small bowel loop in the anterior lower abdomen. Differential diagnosis includes small bowel ileus versus obstruction, with associated enteritis. Small amount of free fluid in pelvis.  No evidence of abscess.   Medical Consultants:  GI Surgery   Other Consultants:  None  IAnti-Infectives:   Cipro and Flagyl 12/08 -->  [name], MD  [facility] Pager [phone]  If 7PM-7AM, please contact night-coverage [email or web address] Password [email or web address] 06/08/2015, 10:53 AM   LOS: 4 days   HPI/Subjective: No events overnight. Still with watery dark stools  Objective: Filed Vitals:   06/07/15 0510 06/07/15 1545 06/07/15 2134 06/08/15 0445  BP: 136/85 137/98 150/88 126/86  Pulse: 80 78 74 79  Temp: 98.8 F (37.1 C) 98.9 F (37.2 C) 98.1 F (36.7 C) 98.3 F (36.8 C)  TempSrc: Oral Oral Oral Oral  Resp: 18 18 19 18   Height:      Weight:      SpO2: 100% 100% 100% 100%    Intake/Output Summary (Last 24 hours) at 06/08/15 1053 Last data filed at 06/08/15 0737  Gross per 24 hour  Intake      0 ml  Output   1350 ml  Net  -1350 ml    Exam:   General:  Pt is alert, follows commands appropriately, not in acute distress  Cardiovascular: Regular rate and rhythm, no rubs, no gallops  Respiratory: Clear to auscultation bilaterally, no wheezing, no crackles, no rhonchi  Abdomen: Soft, non tender, slightly distended, bowel sounds present, no guarding   Data Reviewed: Basic Metabolic Panel:  Recent Labs Lab 06/04/15 1655 06/05/15 0447 06/07/15  0510 06/08/15 0548  NA 139 138 137 137  K 3.9 3.9 3.1* 3.1*  CL 107 111 105 102  CO2 26 23 24 27   GLUCOSE 108* 112* 110* 93  BUN 14 9 5* 8  CREATININE 1.21 1.13 0.98 1.01  CALCIUM 9.4 8.4* 8.6* 8.4*   Liver Function Tests:  Recent Labs Lab 06/04/15 1655 06/05/15 0447  AST 16 16  ALT 18 16*  ALKPHOS 84 71  BILITOT 0.9 1.0  PROT 7.4 5.9*  ALBUMIN 4.1 3.2*    Recent Labs Lab 06/04/15 1655  LIPASE 28   CBC:  Recent  Labs Lab 06/04/15 1655 06/07/15 0510 06/08/15 0548  WBC 7.0 5.2 5.0  NEUTROABS 3.8  --   --   HGB 13.7 11.7* 11.3*  HCT 41.6 35.1* 34.1*  MCV 84.4 84.4 84.4  PLT 217 180 175   Scheduled Meds: . amLODipine  5 mg Oral Daily  . ciprofloxacin  400 mg Intravenous Q12H  . clobetasol cream  1 application Topical BID  . fluticasone  2 spray Each Nare Daily  . heparin  5,000 Units Subcutaneous 3 times per day  . hydrocortisone   Rectal TID  . metoprolol  50 mg Oral Daily  . metronidazole  500 mg Intravenous Q8H  . multivitamin with minerals  1 tablet Oral Daily  . pantoprazole (PROTONIX) IV  40 mg Intravenous Q24H  . potassium chloride  10 mEq Intravenous Q1 Hr x 4  . pravastatin  40 mg Oral q1800  . simethicone  80 mg Oral QID  . tamsulosin  0.4 mg Oral Daily  . triamcinolone ointment  1 application Topical BID   Continuous Infusions: . sodium chloride 50 mL/hr at 06/08/15 0540

## 2015-06-09 ENCOUNTER — Inpatient Hospital Stay (HOSPITAL_COMMUNITY): Payer: Medicare Other

## 2015-06-09 LAB — CBC
HCT: 33.6 % — ABNORMAL LOW (ref 39.0–52.0)
Hemoglobin: 11.6 g/dL — ABNORMAL LOW (ref 13.0–17.0)
MCH: 28.6 pg (ref 26.0–34.0)
MCHC: 34.5 g/dL (ref 30.0–36.0)
MCV: 83 fL (ref 78.0–100.0)
Platelets: 189 10*3/uL (ref 150–400)
RBC: 4.05 MIL/uL — ABNORMAL LOW (ref 4.22–5.81)
RDW: 12.5 % (ref 11.5–15.5)
WBC: 6 10*3/uL (ref 4.0–10.5)

## 2015-06-09 LAB — BASIC METABOLIC PANEL
Anion gap: 9 (ref 5–15)
BUN: 10 mg/dL (ref 6–20)
CO2: 27 mmol/L (ref 22–32)
Calcium: 8.5 mg/dL — ABNORMAL LOW (ref 8.9–10.3)
Chloride: 102 mmol/L (ref 101–111)
Creatinine, Ser: 1.03 mg/dL (ref 0.61–1.24)
GFR calc Af Amer: >60 mL/min (ref >60)
GFR calc non Af Amer: >60 mL/min (ref >60)
Glucose, Bld: 93 mg/dL (ref 65–99)
Potassium: 2.9 mmol/L — ABNORMAL LOW (ref 3.5–5.1)
Sodium: 138 mmol/L (ref 135–145)

## 2015-06-09 LAB — MAGNESIUM: Magnesium: 2 mg/dL (ref 1.7–2.4)

## 2015-06-09 MED ORDER — DIATRIZOATE MEGLUMINE & SODIUM 66-10 % PO SOLN
90.0000 mL | Freq: Once | ORAL | Status: AC
  Administered 2015-06-09: 90 mL via NASOGASTRIC

## 2015-06-09 MED ORDER — POTASSIUM CHLORIDE 10 MEQ/100ML IV SOLN
10.0000 meq | INTRAVENOUS | Status: AC
  Administered 2015-06-09 (×6): 10 meq via INTRAVENOUS
  Filled 2015-06-09 (×6): qty 100

## 2015-06-09 MED ORDER — DIATRIZOATE MEGLUMINE & SODIUM 66-10 % PO SOLN
ORAL | Status: AC
  Filled 2015-06-09: qty 90

## 2015-06-09 MED ORDER — SODIUM CHLORIDE 0.45 % IV SOLN
INTRAVENOUS | Status: DC
  Administered 2015-06-09 – 2015-06-13 (×8): via INTRAVENOUS
  Filled 2015-06-09 (×13): qty 1000

## 2015-06-09 NOTE — Progress Notes (Signed)
Patient ID: [name], male   DOB: [birth year], 69 y.o.   MRN: [id number]   LOS: 5 days   Subjective: Maybe feels a little better that yesterday. Some flatus, no N/V. Little pain but unchanged.   Objective: Vital signs in last 24 hours: Temp:  [98.4 F (36.9 C)-99 F (37.2 C)] 98.4 F (36.9 C) (12/10 0625) Pulse Rate:  [72-84] 78 (12/10 0625) Resp:  [18-188] 18 (12/10 0625) BP: (133-134)/(84-90) 133/84 mmHg (12/10 0625) SpO2:  [97 %-100 %] 97 % (12/10 0625) Last BM Date: 06/08/15   NGT: 2157ml/24h   Laboratory  CBC  Recent Labs  06/08/15 0548 06/09/15 0357  WBC 5.0 6.0  HGB 11.3* 11.6*  HCT 34.1* 33.6*  PLT 175 189   BMET  Recent Labs  06/08/15 0548 06/09/15 0357  NA 137 138  K 3.1* 2.9*  CL 102 102  CO2 27 27  GLUCOSE 93 93  BUN 8 10  CREATININE 1.01 1.03  CALCIUM 8.4* 8.5*    Physical Exam General appearance: alert and no distress Resp: clear to auscultation bilaterally Cardio: regular rate and rhythm GI: Moderate distension, scant BS, minimal TTP   Assessment/Plan: SBO -- Given increase in NGT OP will institute SB protocol and see how he does.  Hypokalemia -- Replace K+.   Lisette Abu, PA-C Pager: [phone] 06/09/2015

## 2015-06-09 NOTE — Progress Notes (Signed)
Patient ID: [name], male   DOB: 13-Dec-1945, 69 y.o.   MRN: [id number] [facility] PROGRESS NOTE  Waddell Schlesser [id number] DOB: July 29, 1945 DOA: 06/04/2015 PCP: Penni Homans, MD   Brief narrative:    Patient is 69 year old male who presented to [facility] for evaluation of several days duration of watery stools, dark in color, epigastric pain, intermittent and throbbing, 3-4/10 in severity and nonradiating. Patient denies similar events in the past, no recent sick contacts or exposures. In the emergency department patient noted to be hemodynamically stable with T 99.4, vital signs otherwise stable, imaging studies concerning for questionable ileus versus enteritis.  Assessment/Plan:    Principal Problem:   Loose and dark stools, abdominal pain - SB enteritis with PSBO ? Inflammatory vs infectious vs ischemic. + fecal lactoferrin - stool path panel still pending.  - abd still distended this AM - Pain improved after NGT placed but still belching  - Day# 3 Cipro and Flagyl   Active Problems:   Hypokalemia - still low, continue to supplement and repeat BMP In AM - Mg is WNL     Hyperlipidemia - Continue statin as per home medical regimen    Hypertension, Essential - Continue Norvasc, lisinopril, metoprolol as per home medical regimen     Obesity (BMI 30.0-34.9) - Body mass index is 30.24 kg/(m^2).  DVT prophylaxis - heparin SQ  Code Status: Full.  Family Communication:  plan of care discussed with the patient and wife at bedside Disposition Plan: Not ready for d/c as pt still with loose stools, surgery and GI team following   IV access:  Peripheral IV  Procedures and diagnostic studies:    Dg Abd 1 View 06/05/2015  No bowel obstruction, oral contrast administered yesterday has reached the rectum. Stable mild gaseous distension of small bowel probably reflecting mild inflammatory ileus.   Ct Abdomen Pelvis W Contrast 06/04/2015  Mid and distal small bowel  dilatation, with wall thickening involving a small bowel loop in the anterior lower abdomen. Differential diagnosis includes small bowel ileus versus obstruction, with associated enteritis. Small amount of free fluid in pelvis.  No evidence of abscess.   Medical Consultants:  GI Surgery   Other Consultants:  None  IAnti-Infectives:   Cipro and Flagyl 12/08 -->  [name], MD  [facility] Pager [phone]  If 7PM-7AM, please contact night-coverage [email or web address] Password [email or web address] 06/09/2015, 12:08 PM   LOS: 5 days   HPI/Subjective: No events overnight. Still with watery dark stools  Objective: Filed Vitals:   06/08/15 0445 06/08/15 1402 06/08/15 2146 06/09/15 0625  BP: 126/86 134/90 133/90 133/84  Pulse: 79 72 84 78  Temp: 98.3 F (36.8 C) 98.9 F (37.2 C) 99 F (37.2 C) 98.4 F (36.9 C)  TempSrc: Oral Oral Oral Oral  Resp: 18 188 18 18  Height:      Weight:      SpO2: 100% 100% 100% 97%    Intake/Output Summary (Last 24 hours) at 06/09/15 1208 Last data filed at 06/09/15 [id number]  Gross per 24 hour  Intake 2043.33 ml  Output   2250 ml  Net -206.67 ml    Exam:   General:  Pt is alert, follows commands appropriately, not in acute distress  Cardiovascular: Regular rate and rhythm, no rubs, no gallops  Respiratory: Clear to auscultation bilaterally, no wheezing, no crackles, no rhonchi  Abdomen: Soft, non tender, slightly distended, bowel sounds present, no guarding   Data Reviewed: Basic Metabolic Panel:  Recent Labs Lab 06/04/15 1655  06/05/15 0447 06/07/15 0510 06/08/15 0548 06/09/15 0357  NA 139 138 137 137 138  K 3.9 3.9 3.1* 3.1* 2.9*  CL 107 111 105 102 102  CO2 26 23 24 27 27   GLUCOSE 108* 112* 110* 93 93  BUN 14 9 5* 8 10  CREATININE 1.21 1.13 0.98 1.01 1.03  CALCIUM 9.4 8.4* 8.6* 8.4* 8.5*  MG  --   --   --   --  2.0   Liver Function Tests:  Recent Labs Lab 06/04/15 1655 06/05/15 0447  AST 16 16  ALT 18 16*  ALKPHOS 84 71   BILITOT 0.9 1.0  PROT 7.4 5.9*  ALBUMIN 4.1 3.2*    Recent Labs Lab 06/04/15 1655  LIPASE 28   CBC:  Recent Labs Lab 06/04/15 1655 06/07/15 0510 06/08/15 0548 06/09/15 0357  WBC 7.0 5.2 5.0 6.0  NEUTROABS 3.8  --   --   --   HGB 13.7 11.7* 11.3* 11.6*  HCT 41.6 35.1* 34.1* 33.6*  MCV 84.4 84.4 84.4 83.0  PLT 217 180 175 189   Scheduled Meds: . amLODipine  5 mg Oral Daily  . ciprofloxacin  400 mg Intravenous Q12H  . clobetasol cream  1 application Topical BID  . fluticasone  2 spray Each Nare Daily  . heparin  5,000 Units Subcutaneous 3 times per day  . hydrocortisone   Rectal TID  . metoprolol  50 mg Oral Daily  . metronidazole  500 mg Intravenous Q8H  . multivitamin with minerals  1 tablet Oral Daily  . pantoprazole (PROTONIX) IV  40 mg Intravenous Q24H  . potassium chloride  10 mEq Intravenous Q1 Hr x 6  . pravastatin  40 mg Oral q1800  . simethicone  80 mg Oral QID  . tamsulosin  0.4 mg Oral Daily  . triamcinolone ointment  1 application Topical BID   Continuous Infusions: . sodium chloride 0.45 % 1,000 mL with potassium chloride 20 mEq infusion 75 mL/hr at 06/09/15 0915

## 2015-06-09 NOTE — Progress Notes (Signed)
Daily Rounding Note  06/09/2015, 9:04 AM  LOS: 5 days   SUBJECTIVE:       NGT output 2.1 liters yesterday.  Some flatus.  No pain meds used yesterday.  Feels bloated.  No nausea, no abd pain.  Small watery stool last night.    OBJECTIVE:         Vital signs in last 24 hours:    Temp:  [98.4 F (36.9 C)-99 F (37.2 C)] 98.4 F (36.9 C) (12/10 0625) Pulse Rate:  [72-84] 78 (12/10 0625) Resp:  [18-188] 18 (12/10 0625) BP: (133-134)/(84-90) 133/84 mmHg (12/10 0625) SpO2:  [97 %-100 %] 97 % (12/10 0625) Last BM Date: 06/08/15 Filed Weights   06/04/15 1635 06/04/15 2237  Weight: 97.07 kg (214 lb) 97 kg (213 lb 13.5 oz)   General: pleasant, comfortable.  NGT in place   Heart: RRR Chest: clear bil.  Abdomen: moderate distention, scant BS.  Dark NGT aspirate, not bloody.   Extremities: no CCE Neuro/Psych:  Pleasant, alert, no somnolence.    Intake/Output from previous day: 12/09 0701 - 12/10 0700 In: 2043.3 [I.V.:1343.3; IV Piggyback:700] Out: 3200 [Urine:1100; Emesis/NG output:2100]  Intake/Output this shift:    Lab Results:  Recent Labs  06/07/15 0510 06/08/15 0548 06/09/15 0357  WBC 5.2 5.0 6.0  HGB 11.7* 11.3* 11.6*  HCT 35.1* 34.1* 33.6*  PLT 180 175 189   BMET  Recent Labs  06/07/15 0510 06/08/15 0548 06/09/15 0357  NA 137 137 138  K 3.1* 3.1* 2.9*  CL 105 102 102  CO2 24 27 27   GLUCOSE 110* 93 93  BUN 5* 8 10  CREATININE 0.98 1.01 1.03  CALCIUM 8.6* 8.4* 8.5*   LFT No results for input(s): PROT, ALBUMIN, AST, ALT, ALKPHOS, BILITOT, BILIDIR, IBILI in the last 72 hours. PT/INR No results for input(s): LABPROT, INR in the last 72 hours. Hepatitis Panel No results for input(s): HEPBSAG, HCVAB, HEPAIGM, HEPBIGM in the last 72 hours.  Studies/Results: Dg Abd Portable 2v  06/07/2015  CLINICAL DATA:  69 year old male with abdominal pain for 5 days. Diarrhea prior to coming to the hospital.  Hypertension. Insert follow-up EXAM: PORTABLE ABDOMEN - 2 VIEW COMPARISON:  06/15/2015 plain film exam and 06/04/2015 CT. FINDINGS: Gas distended small bowel loops measuring up to 4.2 cm versus prior 3.9 cm suggestive of partial small bowel obstruction with gas seen within portions of colon. No free intraperitoneal air detected on decubitus view. IMPRESSION: Partial small bowel obstruction suspected as noted above. Electronically Signed   By: Genia Del M.D.   On: 06/07/2015 12:06    ASSESMENT:   * SB enteritis with PSBO ? inflammatory vs infectious vs ischemic. + fecal lactoferrin, stool path panel pending.  Dulcolax, colace, po iron discontinued.  Pain improved after NGT placed. Some blood tinge to NGT output, ? NGT trauma vs PUD.  Day 2 Cipro/flagyl. WBCs consistently normal.   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 Persists despite po and IV supplement in last 48 hours    PLAN   *  Abdominal films "SBO protocol" ordered by surgery.     Azucena Freed  06/09/2015, 9:04 AM Pager: [phone]     Attending physician's note   I have taken an interval history, reviewed the chart and examined the patient. I agree with the Advanced Practitioner's note, impression and recommendations. Persistent SBO symptoms. Etiology of  thickened SB loop and SBO are not clear. Awaiting repeat abd films today. Continue NG suction. May need ex laparotomy if he does not continue to improve.  Lucio Edward, MD [facility] [phone] Mon-Fri 8a-5p [phone] after 5p, weekends, holidays

## 2015-06-10 DIAGNOSIS — E876 Hypokalemia: Secondary | ICD-10-CM | POA: Diagnosis not present

## 2015-06-10 LAB — BASIC METABOLIC PANEL
Anion gap: 9 (ref 5–15)
BUN: 9 mg/dL (ref 6–20)
CO2: 26 mmol/L (ref 22–32)
Calcium: 8.6 mg/dL — ABNORMAL LOW (ref 8.9–10.3)
Chloride: 103 mmol/L (ref 101–111)
Creatinine, Ser: 1.05 mg/dL (ref 0.61–1.24)
GFR calc Af Amer: >60 mL/min (ref >60)
GFR calc non Af Amer: >60 mL/min (ref >60)
Glucose, Bld: 88 mg/dL (ref 65–99)
Potassium: 3.2 mmol/L — ABNORMAL LOW (ref 3.5–5.1)
Sodium: 138 mmol/L (ref 135–145)

## 2015-06-10 LAB — STOOL CULTURE

## 2015-06-10 MED ORDER — OXYCODONE-ACETAMINOPHEN 5-325 MG PO TABS
1.0000 | ORAL_TABLET | ORAL | Status: DC | PRN
  Administered 2015-06-10: 1 via ORAL
  Administered 2015-06-10: 2 via ORAL
  Filled 2015-06-10: qty 1
  Filled 2015-06-10: qty 2

## 2015-06-10 MED ORDER — METHOCARBAMOL 1000 MG/10ML IJ SOLN
500.0000 mg | Freq: Three times a day (TID) | INTRAVENOUS | Status: DC | PRN
  Administered 2015-06-10: 500 mg via INTRAVENOUS
  Filled 2015-06-10 (×2): qty 5

## 2015-06-10 MED ORDER — POTASSIUM CHLORIDE 10 MEQ/100ML IV SOLN
10.0000 meq | INTRAVENOUS | Status: AC
  Administered 2015-06-10 (×4): 10 meq via INTRAVENOUS
  Filled 2015-06-10 (×4): qty 100

## 2015-06-10 MED ORDER — HYDROMORPHONE HCL 1 MG/ML IJ SOLN
1.0000 mg | INTRAMUSCULAR | Status: DC | PRN
  Administered 2015-06-11 (×2): 1 mg via INTRAVENOUS
  Filled 2015-06-10 (×2): qty 1

## 2015-06-10 NOTE — Progress Notes (Signed)
Pt is c/o neck pain unrelieved by heat packs. says he thinks he needs a muscle relaxer,MD paged and awaiting response

## 2015-06-10 NOTE — Progress Notes (Signed)
MD paged again regarding complaints of neck pain. Awaiting return call

## 2015-06-10 NOTE — Progress Notes (Signed)
1 po percocet administered for neck pain. Pt rating pain at 9/10. Prn robaxin also requested from pharmacy for future use if needed

## 2015-06-10 NOTE — Progress Notes (Signed)
Heat packs applied to neck for c/o pain

## 2015-06-10 NOTE — Progress Notes (Signed)
Pt has not been nauseated all afternoon. NGT placement verified, aspirated with no residuals noted. Tube discontinued as ordered by MD. Pt now on a clear liquid diet

## 2015-06-10 NOTE — Progress Notes (Signed)
Daily Rounding Note  06/10/2015, 11:11 AM  LOS: 6 days   SUBJECTIVE:       Only 200 cc of NGT ouput yesterday, 50 cc so far today.   Loose stools before and after SB xrays yesterday.   No abd pain and bloating is improved.   OBJECTIVE:         Vital signs in last 24 hours:    Temp:  [98.1 F (36.7 C)-98.4 F (36.9 C)] 98.1 F (36.7 C) (12/11 0523) Pulse Rate:  [81-83] 83 (12/11 0523) Resp:  [18] 18 (12/11 0523) BP: (137-147)/(91-97) 137/97 mmHg (12/11 0523) SpO2:  [98 %-100 %] 100 % (12/11 0523) Last BM Date: 06/09/15 Filed Weights   06/04/15 1635 06/04/15 2237  Weight: 97.07 kg (214 lb) 97 kg (213 lb 13.5 oz)   General: looks well.  NGT clamped   Heart: RRR Chest: clear bil Abdomen: soft but still moderately distended.  Active BS, NT  Extremities: no CCE Neuro/Psych:  Pleasant, calm, in good spirits.   Intake/Output from previous day: 12/10 0701 - 12/11 0700 In: 2210 [I.V.:1480; NG/GT:30; IV Piggyback:700] Out: 700 [Urine:500; Emesis/NG output:200]  Intake/Output this shift: Total I/O In: -  Out: 50 [Emesis/NG output:50]  Lab Results:  Recent Labs  06/08/15 0548 06/09/15 0357  WBC 5.0 6.0  HGB 11.3* 11.6*  HCT 34.1* 33.6*  PLT 175 189   BMET  Recent Labs  06/08/15 0548 06/09/15 0357 06/10/15 0455  NA 137 138 138  K 3.1* 2.9* 3.2*  CL 102 102 103  CO2 27 27 26   GLUCOSE 93 93 88  BUN 8 10 9   CREATININE 1.01 1.03 1.05  CALCIUM 8.4* 8.5* 8.6*   LFT No results for input(s): PROT, ALBUMIN, AST, ALT, ALKPHOS, BILITOT, BILIDIR, IBILI in the last 72 hours. PT/INR No results for input(s): LABPROT, INR in the last 72 hours. Hepatitis Panel No results for input(s): HEPBSAG, HCVAB, HEPAIGM, HEPBIGM in the last 72 hours.  Studies/Results: Dg Abd Portable 1v-small Bowel Obstruction Protocol-initial, 8 Hr Delay  06/09/2015  CLINICAL DATA:  Small-bowel obstruction EXAM: PORTABLE ABDOMEN - 1  VIEW COMPARISON:  06/07/2015 FINDINGS: Oral contrast is administered and extends throughout the entire large bowel. There remain mildly dilated loops of central small bowel. The degree of distention is similar, with maximal diameter of about 4.7 cm. An NG tube projects over the left lung base/left upper quadrant. Position is not well evaluated as it is at the edge of the film. IMPRESSION: Suspect partial small bowel obstruction. Recommend abdominal film centered at the diaphragm to optimally evaluate NG tube position. Electronically Signed   By: Skipper Cliche M.D.   On: 06/09/2015 19:06   Scheduled Meds: . amLODipine  5 mg Oral Daily  . ciprofloxacin  400 mg Intravenous Q12H  . clobetasol cream  1 application Topical BID  . fluticasone  2 spray Each Nare Daily  . heparin  5,000 Units Subcutaneous 3 times per day  . metoprolol  50 mg Oral Daily  . metronidazole  500 mg Intravenous Q8H  . multivitamin with minerals  1 tablet Oral Daily  . pantoprazole (PROTONIX) IV  40 mg Intravenous Q24H  . potassium chloride  10 mEq Intravenous Q1 Hr x 4  . pravastatin  40 mg Oral q1800  . simethicone  80 mg Oral QID  . tamsulosin  0.4 mg Oral Daily  . triamcinolone ointment  1 application Topical BID   Continuous Infusions: .  sodium chloride 0.45 % 1,000 mL with potassium chloride 20 mEq infusion 75 mL/hr at 06/10/15 0149   PRN Meds:.ALPRAZolam, fentaNYL (SUBLIMAZE) injection, iohexol, ondansetron **OR** ondansetron (ZOFRAN) IV, phenol   ASSESMENT:   * SB enteritis with PSBO ? inflammatory vs infectious vs ischemic. + fecal lactoferrin, stool path panel negative.  Pain improved after NGT placed.  Day 3 Cipro/flagyl. WBCs consistently normal.  Films show contrast in colon but still has stable, mild distention SB loops, suggesting PSBO.   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 Persists despite daily po and then IV  supplement.  Magnesium level ok.     PLAN   *  Per Dr Donne Hazel of gen surg, does not need surgery now, he ordered NGT clamp and, if tolerates, removal later today. Could add clears either before removal (to assure no obstuctive sxs) vs after NGT removal.  Will defer decision to surgeon.   *  IV potassium per today's orders, 4 runs.     [name]  06/10/2015, 11:11 AM Pager: [phone]     Attending physician's note   I have taken an interval history, reviewed the chart and examined the patient. I agree with the Advanced Practitioner's note, impression and recommendations. Persistent partial SBO, improved but not sure it has resolved. Surgery clamping NGT.   Lucio Edward, MD [facility] [phone] Mon-Fri 8a-5p [phone] after 5p, weekends, holidays

## 2015-06-10 NOTE — Progress Notes (Signed)
NGT currently clamped off as pt just had his morning po meds. Pt currently ambulating in hallway

## 2015-06-10 NOTE — Progress Notes (Signed)
Pt 's NGt has been clamped off since 0930hrs this morning. No c/o of nausea or abdominal discomfort voiced

## 2015-06-10 NOTE — Progress Notes (Signed)
  Subjective: Having bms/flatus, feels back to normal. Contrast in colon from sb films  Objective: Vital signs in last 24 hours: Temp:  [98.1 F (36.7 C)-98.4 F (36.9 C)] 98.1 F (36.7 C) (12/11 0523) Pulse Rate:  [81-83] 83 (12/11 0523) Resp:  [18] 18 (12/11 0523) BP: (137-147)/(91-97) 137/97 mmHg (12/11 0523) SpO2:  [98 %-100 %] 100 % (12/11 0523) Last BM Date: 06/09/15  Intake/Output from previous day: 12/10 0701 - 12/11 0700 In: 2210 [I.V.:1480; NG/GT:30; IV Piggyback:700] Out: 700 [Urine:500; Emesis/NG output:200] Intake/Output this shift:    GI: small reducible uh nontender, abd nontender soft with good bs  Lab Results:   Recent Labs  06/08/15 0548 06/09/15 0357  WBC 5.0 6.0  HGB 11.3* 11.6*  HCT 34.1* 33.6*  PLT 175 189   BMET  Recent Labs  06/09/15 0357 06/10/15 0455  NA 138 138  K 2.9* 3.2*  CL 102 103  CO2 27 26  GLUCOSE 93 88  BUN 10 9  CREATININE 1.03 1.05  CALCIUM 8.5* 8.6*   PT/INR No results for input(s): LABPROT, INR in the last 72 hours. ABG No results for input(s): PHART, HCO3 in the last 72 hours.  Invalid input(s): PCO2, PO2  Studies/Results: Dg Abd Portable 1v-small Bowel Obstruction Protocol-initial, 8 Hr Delay  06/09/2015  CLINICAL DATA:  Small-bowel obstruction EXAM: PORTABLE ABDOMEN - 1 VIEW COMPARISON:  06/07/2015 FINDINGS: Oral contrast is administered and extends throughout the entire large bowel. There remain mildly dilated loops of central small bowel. The degree of distention is similar, with maximal diameter of about 4.7 cm. An NG tube projects over the left lung base/left upper quadrant. Position is not well evaluated as it is at the edge of the film. IMPRESSION: Suspect partial small bowel obstruction. Recommend abdominal film centered at the diaphragm to optimally evaluate NG tube position. Electronically Signed   By: Skipper Cliche M.D.   On: 06/09/2015 19:06    Anti-infectives: Anti-infectives    Start      Dose/Rate Route Frequency Ordered Stop   06/07/15 1345  ciprofloxacin (CIPRO) IVPB 400 mg     400 mg 200 mL/hr over 60 Minutes Intravenous Every 12 hours 06/07/15 1301     06/07/15 1345  metroNIDAZOLE (FLAGYL) IVPB 500 mg     500 mg 100 mL/hr over 60 Minutes Intravenous Every 8 hours 06/07/15 1301        Assessment/Plan: sbo ,not sure of etiology   He is clearly improved clinically although xray not completely better, do not think he needs surgery now Will trial ng clamping today and remove later if does well  [facility] 06/10/2015

## 2015-06-10 NOTE — Progress Notes (Addendum)
Patient ID: [name], male   DOB: Mar 30, 1946, 69 y.o.   MRN: [id number] [facility] PROGRESS NOTE  [name] [id number] DOB: 1946/01/17 DOA: 06/04/2015 PCP: Penni Homans, MD   Brief narrative:    Patient is 69 year old male who presented to [facility] for evaluation of several days duration of watery stools, dark in color, epigastric pain, intermittent and throbbing, 3-4/10 in severity and nonradiating. Patient denies similar events in the past, no recent sick contacts or exposures. In the emergency department patient noted to be hemodynamically stable with T 99.4, vital signs otherwise stable, imaging studies concerning for questionable ileus versus enteritis.  Assessment/Plan:    Principal Problem:   Loose and dark stools, abdominal pain - SB enteritis with PSBO ? Inflammatory vs infectious vs ischemic. + fecal lactoferrin - stool path panel still pending.  - abd remains distended but softer, NGT in place  - Pain improved after NGT in place but still belching  - plan to clamp tube today and if tolerating diet, can be discontinued possibly this afternoon  - Day# 4 Cipro and Flagyl per GI team   Active Problems:   Hypokalemia - still low, continue to supplement and repeat BMP In AM - Mg is WNL     Hyperlipidemia - Continue statin as per home medical regimen    Hypertension, Essential - Continue Norvasc, metoprolol as per home medical regimen     Obesity (BMI 30.0-34.9) - Body mass index is 30.24 kg/(m^2).  DVT prophylaxis - heparin SQ  Code Status: Full.  Family Communication:  plan of care discussed with the patient and wife at bedside Disposition Plan: Not ready for d/c as pt still with loose stools, surgery and GI team following   IV access:  Peripheral IV  Procedures and diagnostic studies:    Dg Abd 1 View 06/05/2015  No bowel obstruction, oral contrast administered yesterday has reached the rectum. Stable mild gaseous distension of small bowel  probably reflecting mild inflammatory ileus.   Ct Abdomen Pelvis W Contrast 06/04/2015  Mid and distal small bowel dilatation, with wall thickening involving a small bowel loop in the anterior lower abdomen. Differential diagnosis includes small bowel ileus versus obstruction, with associated enteritis. Small amount of free fluid in pelvis.  No evidence of abscess.   Medical Consultants:  GI Surgery   Other Consultants:  None  IAnti-Infectives:   Cipro and Flagyl 12/08 -->  [name], MD  [facility] Pager [phone]  If 7PM-7AM, please contact night-coverage [email or web address] Password [email or web address] 06/10/2015, 7:45 AM   LOS: 6 days   HPI/Subjective: No events overnight. Still with watery dark stools, better this AM.  Objective: Filed Vitals:   06/08/15 2146 06/09/15 0625 06/09/15 1336 06/10/15 0523  BP: 133/90 133/84 147/91 137/97  Pulse: 84 78 81 83  Temp: 99 F (37.2 C) 98.4 F (36.9 C) 98.4 F (36.9 C) 98.1 F (36.7 C)  TempSrc: Oral Oral Oral Oral  Resp: 18 18 18 18   Height:      Weight:      SpO2: 100% 97% 98% 100%    Intake/Output Summary (Last 24 hours) at 06/10/15 0745 Last data filed at 06/10/15 0524  Gross per 24 hour  Intake   2210 ml  Output    700 ml  Net   1510 ml    Exam:   General:  Pt is alert, follows commands appropriately, not in acute distress  Cardiovascular: Regular rate and rhythm, no rubs, no gallops  Respiratory: Clear to auscultation  bilaterally, no wheezing, no crackles, no rhonchi  Abdomen: Soft, non tender, slightly distended, bowel sounds present, no guarding   Data Reviewed: Basic Metabolic Panel:  Recent Labs Lab 06/05/15 0447 06/07/15 0510 06/08/15 0548 06/09/15 0357 06/10/15 0455  NA 138 137 137 138 138  K 3.9 3.1* 3.1* 2.9* 3.2*  CL 111 105 102 102 103  CO2 23 24 27 27 26   GLUCOSE 112* 110* 93 93 88  BUN 9 5* 8 10 9   CREATININE 1.13 0.98 1.01 1.03 1.05  CALCIUM 8.4* 8.6* 8.4* 8.5* 8.6*  MG  --   --   --  2.0   --    Liver Function Tests:  Recent Labs Lab 06/04/15 1655 06/05/15 0447  AST 16 16  ALT 18 16*  ALKPHOS 84 71  BILITOT 0.9 1.0  PROT 7.4 5.9*  ALBUMIN 4.1 3.2*    Recent Labs Lab 06/04/15 1655  LIPASE 28   CBC:  Recent Labs Lab 06/04/15 1655 06/07/15 0510 06/08/15 0548 06/09/15 0357  WBC 7.0 5.2 5.0 6.0  NEUTROABS 3.8  --   --   --   HGB 13.7 11.7* 11.3* 11.6*  HCT 41.6 35.1* 34.1* 33.6*  MCV 84.4 84.4 84.4 83.0  PLT 217 180 175 189   Scheduled Meds: . amLODipine  5 mg Oral Daily  . ciprofloxacin  400 mg Intravenous Q12H  . clobetasol cream  1 application Topical BID  . fluticasone  2 spray Each Nare Daily  . heparin  5,000 Units Subcutaneous 3 times per day  . hydrocortisone   Rectal TID  . metoprolol  50 mg Oral Daily  . metronidazole  500 mg Intravenous Q8H  . multivitamin with minerals  1 tablet Oral Daily  . pantoprazole (PROTONIX) IV  40 mg Intravenous Q24H  . pravastatin  40 mg Oral q1800  . simethicone  80 mg Oral QID  . tamsulosin  0.4 mg Oral Daily  . triamcinolone ointment  1 application Topical BID   Continuous Infusions: . sodium chloride 0.45 % 1,000 mL with potassium chloride 20 mEq infusion 75 mL/hr at 06/10/15 0149

## 2015-06-11 ENCOUNTER — Inpatient Hospital Stay (HOSPITAL_COMMUNITY): Payer: Medicare Other

## 2015-06-11 DIAGNOSIS — K5660 Unspecified intestinal obstruction: Principal | ICD-10-CM

## 2015-06-11 DIAGNOSIS — K56609 Unspecified intestinal obstruction, unspecified as to partial versus complete obstruction: Secondary | ICD-10-CM | POA: Diagnosis present

## 2015-06-11 LAB — BASIC METABOLIC PANEL
Anion gap: 7 (ref 5–15)
BUN: 6 mg/dL (ref 6–20)
CO2: 27 mmol/L (ref 22–32)
Calcium: 8.4 mg/dL — ABNORMAL LOW (ref 8.9–10.3)
Chloride: 102 mmol/L (ref 101–111)
Creatinine, Ser: 0.99 mg/dL (ref 0.61–1.24)
GFR calc Af Amer: >60 mL/min (ref >60)
GFR calc non Af Amer: >60 mL/min (ref >60)
Glucose, Bld: 125 mg/dL — ABNORMAL HIGH (ref 65–99)
Potassium: 3.6 mmol/L (ref 3.5–5.1)
Sodium: 136 mmol/L (ref 135–145)

## 2015-06-11 LAB — CBC
HCT: 34.2 % — ABNORMAL LOW (ref 39.0–52.0)
Hemoglobin: 11.2 g/dL — ABNORMAL LOW (ref 13.0–17.0)
MCH: 27.5 pg (ref 26.0–34.0)
MCHC: 32.7 g/dL (ref 30.0–36.0)
MCV: 84 fL (ref 78.0–100.0)
Platelets: 190 10*3/uL (ref 150–400)
RBC: 4.07 MIL/uL — ABNORMAL LOW (ref 4.22–5.81)
RDW: 12.6 % (ref 11.5–15.5)
WBC: 5.9 10*3/uL (ref 4.0–10.5)

## 2015-06-11 MED ORDER — KETOROLAC TROMETHAMINE 30 MG/ML IJ SOLN
30.0000 mg | Freq: Four times a day (QID) | INTRAMUSCULAR | Status: AC
  Administered 2015-06-11 – 2015-06-12 (×3): 30 mg via INTRAVENOUS
  Filled 2015-06-11 (×3): qty 1

## 2015-06-11 MED ORDER — KETOROLAC TROMETHAMINE 30 MG/ML IJ SOLN
30.0000 mg | Freq: Once | INTRAMUSCULAR | Status: AC
  Administered 2015-06-11: 30 mg via INTRAVENOUS
  Filled 2015-06-11: qty 1

## 2015-06-11 MED ORDER — DEXTROSE 5 % IV SOLN: 1000.0000 mg | Freq: Three times a day (TID) | INTRAVENOUS | Status: DC | PRN

## 2015-06-11 MED ORDER — LIDOCAINE 5 % EX PTCH
1.0000 | MEDICATED_PATCH | CUTANEOUS | Status: DC
  Administered 2015-06-11 – 2015-06-13 (×3): 1 via TRANSDERMAL
  Filled 2015-06-11 (×3): qty 1

## 2015-06-11 NOTE — Care Management Important Message (Signed)
Important Message  Patient Details  Name: [name] MRN: [id number] Date of Birth: 1946-05-17   Medicare Important Message Given:  Yes    Barb Merino Channon Brougher 06/11/2015, 3:39 PM

## 2015-06-11 NOTE — Progress Notes (Signed)
[facility] Progress Note     Subjective: He says his abdomen feels much better.  No more abdominal pain or distension.  Having flatus, but no BM yet.  Says he was nauseated this am and threw up after drinking water.  Right after I left the room he threw up again.  He wonders if its from the pain meds he got for his neck.    Objective: Vital signs in last 24 hours: Temp:  [98.3 F (36.8 C)-99 F (37.2 C)] 98.3 F (36.8 C) (12/12 0625) Pulse Rate:  [78-92] 92 (12/12 0625) Resp:  [18-19] 19 (12/12 0625) BP: (130-144)/(86-99) 130/86 mmHg (12/12 0625) SpO2:  [100 %] 100 % (12/12 0625) Last BM Date: 06/10/15  Intake/Output from previous day: 12/11 0701 - 12/12 0700 In: 220 [P.O.:220] Out: 1100 [Urine:1050; Emesis/NG output:50] Intake/Output this shift:    PE: Gen:  Alert, NAD, pleasant Neck:  Stiff with limited ROM Abd: some what distended, NT, some BS, no HSM, no abdominal scars noted   Lab Results:   Recent Labs  06/09/15 0357 06/11/15 0652  WBC 6.0 5.9  HGB 11.6* 11.2*  HCT 33.6* 34.2*  PLT 189 190   BMET  Recent Labs  06/10/15 0455 06/11/15 0652  NA 138 136  K 3.2* 3.6  CL 103 102  CO2 26 27  GLUCOSE 88 125*  BUN 9 6  CREATININE 1.05 0.99  CALCIUM 8.6* 8.4*   PT/INR No results for input(s): LABPROT, INR in the last 72 hours. CMP     Component Value Date/Time   NA 136 06/11/2015 0652   K 3.6 06/11/2015 0652   CL 102 06/11/2015 0652   CO2 27 06/11/2015 0652   GLUCOSE 125* 06/11/2015 0652   BUN 6 06/11/2015 0652   CREATININE 0.99 06/11/2015 0652   CREATININE 1.15 02/06/2014 0816   CALCIUM 8.4* 06/11/2015 0652   PROT 5.9* 06/05/2015 0447   ALBUMIN 3.2* 06/05/2015 0447   AST 16 06/05/2015 0447   ALT 16* 06/05/2015 0447   ALKPHOS 71 06/05/2015 0447   BILITOT 1.0 06/05/2015 0447   GFRNONAA >60 06/11/2015 0652   GFRAA >60 06/11/2015 0652   Lipase     Component Value Date/Time   LIPASE 28 06/04/2015 1655        Studies/Results: Dg Abd Portable 1v-small Bowel Obstruction Protocol-initial, 8 Hr Delay  06/09/2015  CLINICAL DATA:  Small-bowel obstruction EXAM: PORTABLE ABDOMEN - 1 VIEW COMPARISON:  06/07/2015 FINDINGS: Oral contrast is administered and extends throughout the entire large bowel. There remain mildly dilated loops of central small bowel. The degree of distention is similar, with maximal diameter of about 4.7 cm. An NG tube projects over the left lung base/left upper quadrant. Position is not well evaluated as it is at the edge of the film. IMPRESSION: Suspect partial small bowel obstruction. Recommend abdominal film centered at the diaphragm to optimally evaluate NG tube position. Electronically Signed   By: Skipper Cliche M.D.   On: 06/09/2015 19:06    Anti-infectives: Anti-infectives    Start     Dose/Rate Route Frequency Ordered Stop   06/07/15 1345  ciprofloxacin (CIPRO) IVPB 400 mg     400 mg 200 mL/hr over 60 Minutes Intravenous Every 12 hours 06/07/15 1301     06/07/15 1345  metroNIDAZOLE (FLAGYL) IVPB 500 mg     500 mg 100 mL/hr over 60 Minutes Intravenous Every 8 hours 06/07/15 1301         Assessment/Plan PSBO, ? enteritis LLQ  abdominal pain -Contrast in colon from SB films, NG was removed yesterday and he was started on clears which he tolerated yesterday.  This am he has N/V.  Made NPO.  Check xray, if worse or continues to vomit, may need NG tube replaced -Antiemetics, IVF, limit narcotics due to effects on bowels.  Increase robaxin for neck and add heat pad and lidoderm patch -Ambulate and IS -SCD's and heparin     LOS: 7 days    Nat Christen 06/11/2015, 8:02 AM Pager: [phone]

## 2015-06-11 NOTE — Progress Notes (Signed)
Patient ID: [name], male   DOB: 1945-09-30, 69 y.o.   MRN: [id number] [facility] PROGRESS NOTE  [name] [id number] DOB: [birth year] DOA: 06/04/2015 PCP: Penni Homans, MD   Brief narrative:    Patient is 69 year old male who presented to [facility] for evaluation of several days duration of watery stools, dark in color, epigastric pain, intermittent and throbbing, 3-4/10 in severity and nonradiating. Patient denies similar events in the past, no recent sick contacts or exposures. In the emergency department patient noted to be hemodynamically stable with T 99.4, vital signs otherwise stable, imaging studies concerning for questionable ileus versus enteritis.  Major events since admission: 12/08 - NGT placed, GI consulted 12/09 - surgery consulted  12/11 - NGT removed, more nausea in the afternoon  12/12 - still with nausea and one episode of vomiting   Assessment/Plan:    Principal Problem:   Loose and dark stools, abdominal pain, N/V  - SB enteritis with PSBO ? Inflammatory vs infectious vs ischemic. + fecal lactoferrin - stool culture and GI panel negative, O&p negative  - abd remains distended but softer, NGT removed 12/11 - pt with 2 episodes of emesis this AM, keep NPO for now  - Day# 5 Cipro and Flagyl per GI team  - needs repeat imaging studies today   Active Problems:   Hypokalemia - MG WNL, K supplemented and WNL this AM    Hyperlipidemia - Continue statin as per home medical regimen    Hypertension, Essential - Continue Norvasc, metoprolol as per home medical regimen     Obesity (BMI 30.0-34.9) - Body mass index is 30.24 kg/(m^2).  DVT prophylaxis - heparin SQ  Code Status: Full.  Family Communication:  plan of care discussed with the patient and wife at bedside Disposition Plan: Not ready for d/c as pt still with N/V, abd distension   IV access:  Peripheral IV  Procedures and diagnostic studies:    Dg Abd 1 View 06/05/2015  No bowel  obstruction, oral contrast administered yesterday has reached the rectum. Stable mild gaseous distension of small bowel probably reflecting mild inflammatory ileus.   Ct Abdomen Pelvis W Contrast 06/04/2015  Mid and distal small bowel dilatation, with wall thickening involving a small bowel loop in the anterior lower abdomen. Differential diagnosis includes small bowel ileus versus obstruction, with associated enteritis. Small amount of free fluid in pelvis.  No evidence of abscess.   Dg Abd Portable 1v 06/11/2015  Persistent mild dilatation of the small bowel. There may be some mild improvement when compared with the prior exam.   Dg Abd Portable 1v-small Bowel Obstruction Protocol-initial, 8 Hr Delay 06/09/2015  Suspect partial small bowel obstruction. Recommend abdominal film centered at the diaphragm to optimally evaluate NG tube position.   Medical Consultants:  GI Surgery   Other Consultants:  None  IAnti-Infectives:   Cipro and Flagyl 12/08 -->  [name], MD  [facility] Pager [phone]  If 7PM-7AM, please contact night-coverage [email or web address] Password [email or web address] 06/11/2015, 3:08 PM   LOS: 7 days   HPI/Subjective: No events overnight. Nausea and vomiting x 2 this AM.  Objective: Filed Vitals:   06/10/15 0523 06/10/15 1400 06/10/15 2124 06/11/15 0625  BP: 137/97 144/99 134/93 130/86  Pulse: 83 78 87 92  Temp: 98.1 F (36.7 C) 99 F (37.2 C) 98.9 F (37.2 C) 98.3 F (36.8 C)  TempSrc: Oral Oral Oral   Resp: 18 18 18 19   Height:      Weight:  SpO2: 100% 100% 100% 100%    Intake/Output Summary (Last 24 hours) at 06/11/15 1508 Last data filed at 06/11/15 0900  Gross per 24 hour  Intake    220 ml  Output   1350 ml  Net  -1130 ml    Exam:   General:  Pt is alert, follows commands appropriately, not in acute distress  Cardiovascular: Regular rate and rhythm, no rubs, no gallops  Respiratory: Clear to auscultation bilaterally, no wheezing, no crackles,  no rhonchi  Abdomen: Soft, non tender, slightly distended, bowel sounds present, no guarding   Data Reviewed: Basic Metabolic Panel:  Recent Labs Lab 06/07/15 0510 06/08/15 0548 06/09/15 0357 06/10/15 0455 06/11/15 0652  NA 137 137 138 138 136  K 3.1* 3.1* 2.9* 3.2* 3.6  CL 105 102 102 103 102  CO2 24 27 27 26 27   GLUCOSE 110* 93 93 88 125*  BUN 5* 8 10 9 6   CREATININE 0.98 1.01 1.03 1.05 0.99  CALCIUM 8.6* 8.4* 8.5* 8.6* 8.4*  MG  --   --  2.0  --   --    Liver Function Tests:  Recent Labs Lab 06/04/15 1655 06/05/15 0447  AST 16 16  ALT 18 16*  ALKPHOS 84 71  BILITOT 0.9 1.0  PROT 7.4 5.9*  ALBUMIN 4.1 3.2*    Recent Labs Lab 06/04/15 1655  LIPASE 28   CBC:  Recent Labs Lab 06/04/15 1655 06/07/15 0510 06/08/15 0548 06/09/15 0357 06/11/15 0652  WBC 7.0 5.2 5.0 6.0 5.9  NEUTROABS 3.8  --   --   --   --   HGB 13.7 11.7* 11.3* 11.6* 11.2*  HCT 41.6 35.1* 34.1* 33.6* 34.2*  MCV 84.4 84.4 84.4 83.0 84.0  PLT 217 180 175 189 190   Scheduled Meds: . amLODipine  5 mg Oral Daily  . ciprofloxacin  400 mg Intravenous Q12H  . clobetasol cream  1 application Topical BID  . fluticasone  2 spray Each Nare Daily  . heparin  5,000 Units Subcutaneous 3 times per day  . lidocaine  1 patch Transdermal Q24H  . metoprolol  50 mg Oral Daily  . metronidazole  500 mg Intravenous Q8H  . multivitamin with minerals  1 tablet Oral Daily  . pantoprazole (PROTONIX) IV  40 mg Intravenous Q24H  . pravastatin  40 mg Oral q1800  . simethicone  80 mg Oral QID  . tamsulosin  0.4 mg Oral Daily  . triamcinolone ointment  1 application Topical BID   Continuous Infusions: . sodium chloride 0.45 % 1,000 mL with potassium chloride 20 mEq infusion 125 mL/hr at 06/11/15 0909

## 2015-06-11 NOTE — Care Management Note (Signed)
Case Management Note  Patient Details  Name: [name] MRN: [id number] Date of Birth: Feb 09, 1946  Subjective/Objective:                    Action/Plan:  UR updated  Expected Discharge Date:                  Expected Discharge Plan:  Home/Self Care  In-House Referral:     Discharge planning Services     Post Acute Care Choice:    Choice offered to:     DME Arranged:    DME Agency:     HH Arranged:    [place] Agency:     Status of Service:  In process, will continue to follow  Medicare Important Message Given:    Date Medicare IM Given:    Medicare IM give by:    Date Additional Medicare IM Given:    Additional Medicare Important Message give by:     If discussed at [place] of Stay Meetings, dates discussed:    Additional Comments:  Marilu Favre, RN 06/11/2015, 11:39 AM

## 2015-06-11 NOTE — Progress Notes (Signed)
Daily Rounding Note  06/11/2015, 8:31 AM  LOS: 7 days   SUBJECTIVE:       NGT removed yesterday ~ 4 PM.  Tolerated clears at supper.  No stools yest or today, last was 12/10.  Some flatus.  Belly feels better but still lingering pain in LLQ, bloating better.   PRN Percocet, Robaxin, Dilaudid,  added for neck pain.  Vomited 2 x this AM, timing corresponds somewhat with doses of Dilaudid and at least one incidence was after drinking water.     OBJECTIVE:         Vital signs in last 24 hours:    Temp:  [98.3 F (36.8 C)-99 F (37.2 C)] 98.3 F (36.8 C) (12/12 0625) Pulse Rate:  [78-92] 92 (12/12 0625) Resp:  [18-19] 19 (12/12 0625) BP: (130-144)/(86-99) 130/86 mmHg (12/12 0625) SpO2:  [100 %] 100 % (12/12 0625) Last BM Date: 06/10/15 Filed Weights   06/04/15 1635 06/04/15 2237  Weight: 97.07 kg (214 lb) 97 kg (213 lb 13.5 oz)   General: pleasant. Alert.  Does not look ill.  A bit uncomfortable but not in severe pain.    Heart: RRR.  No MRG Chest: clear bil.  No cough or dyspnea Abdomen: soft, seems less distended.  Tender in LLQ: mild.  Scant, if any BS but no high-pitched/tinkling sounds and no increased tympany.  MS:  Antalgic, en-bloc movement of neck and spine. Tender upper traps and scalenes bil.    Extremities: no CCE Neuro/Psych:  Oriented x 3.  Fully alert.   Intake/Output from previous day: 12/11 0701 - 12/12 0700 In: 220 [P.O.:220] Out: 1100 [Urine:1050; Emesis/NG output:50]  Intake/Output this shift:    Lab Results:  Recent Labs  06/09/15 0357 06/11/15 0652  WBC 6.0 5.9  HGB 11.6* 11.2*  HCT 33.6* 34.2*  PLT 189 190   BMET  Recent Labs  06/09/15 0357 06/10/15 0455 06/11/15 0652  NA 138 138 136  K 2.9* 3.2* 3.6  CL 102 103 102  CO2 27 26 27   GLUCOSE 93 88 125*  BUN 10 9 6   CREATININE 1.03 1.05 0.99  CALCIUM 8.5* 8.6* 8.4*   LFT No results for input(s): PROT, ALBUMIN, AST, ALT,  ALKPHOS, BILITOT, BILIDIR, IBILI in the last 72 hours. PT/INR No results for input(s): LABPROT, INR in the last 72 hours. Hepatitis Panel No results for input(s): HEPBSAG, HCVAB, HEPAIGM, HEPBIGM in the last 72 hours.  Studies/Results: Dg Abd Portable 1v-small Bowel Obstruction Protocol-initial, 8 Hr Delay  06/09/2015  CLINICAL DATA:  Small-bowel obstruction EXAM: PORTABLE ABDOMEN - 1 VIEW COMPARISON:  06/07/2015 FINDINGS: Oral contrast is administered and extends throughout the entire large bowel. There remain mildly dilated loops of central small bowel. The degree of distention is similar, with maximal diameter of about 4.7 cm. An NG tube projects over the left lung base/left upper quadrant. Position is not well evaluated as it is at the edge of the film. IMPRESSION: Suspect partial small bowel obstruction. Recommend abdominal film centered at the diaphragm to optimally evaluate NG tube position. Electronically Signed   By: Skipper Cliche M.D.   On: 06/09/2015 19:06   Scheduled Meds: . amLODipine  5 mg Oral Daily  . ciprofloxacin  400 mg Intravenous Q12H  . clobetasol cream  1 application Topical BID  . fluticasone  2 spray Each Nare Daily  . heparin  5,000 Units Subcutaneous 3 times per day  . lidocaine  1 patch  Transdermal Q24H  . metoprolol  50 mg Oral Daily  . metronidazole  500 mg Intravenous Q8H  . multivitamin with minerals  1 tablet Oral Daily  . pantoprazole (PROTONIX) IV  40 mg Intravenous Q24H  . pravastatin  40 mg Oral q1800  . simethicone  80 mg Oral QID  . tamsulosin  0.4 mg Oral Daily  . triamcinolone ointment  1 application Topical BID   Continuous Infusions: . sodium chloride 0.45 % 1,000 mL with potassium chloride 20 mEq infusion 75 mL/hr at 06/10/15 1712   PRN Meds:.ALPRAZolam, fentaNYL (SUBLIMAZE) injection, HYDROmorphone (DILAUDID) injection, iohexol, methocarbamol (ROBAXIN)  IV, ondansetron **OR** ondansetron (ZOFRAN) IV, oxyCODONE-acetaminophen,  phenol   ASSESMENT:   * SB enteritis with PSBO ? inflammatory vs infectious vs ischemic. + fecal lactoferrin, stool path panel negative. WBCs consistently normal. Pain improved after NGT placed (12/8 - 12/11) .  Day 5 Cipro/flagyl.  Still on scheduled Simethicone, IV Protonix.  Films show contrast in colon but still has stable, mild distention SB loops, suggesting PSBO.  NGT removed 12/12 in setting of improved pain, passing loose stool and flatus and decreased NGT output.  *  Neck pain. Musculoskeletal.    *  Hypocalcemia.   *  Hypokalemia, resolved after 4 days of supplementation.   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 Persists despite daily po and then IV supplement. Magnesium level ok.    PLAN   *  Added single dose of Toradol.   *  Since ordered NPO again, I upped IVF to 139ml/hour. .    *  Abdominal films ordered, await results.  Lidoderm patch and increased Robaxin for neck pain per surgery. .  *  Albumin and calcium levels (cmet) in AM    Azucena Freed  06/11/2015, 8:31 AM Pager: [place] Attending   I have taken an interval history, reviewed the chart and examined the patient. I agree with the Advanced Practitioner's note, impression and recommendations.   He is improved this PM - no vomiting since AM after analgesic.  Will allow clears again  Toradol x a few more doses for his neck  Eventually - presumably as outpt - will need a f/u CT   Gatha Mayer, MD, [facility] [phone] (pager) [phone] after 5 PM, weekends and holidays  06/11/2015 4:53 PM

## 2015-06-11 NOTE — Progress Notes (Signed)
Pt a/o, c/o neck pain and stiffness, PRN Robaxin given with little relief, pt also given 2 Percocet with some relief, heat backs and rolled towel used to comfort, pt denies numbness/tingling in upper extremeties, no c/o n/v or HA, VSS, pt stable

## 2015-06-11 NOTE — Progress Notes (Signed)
Noticed 6am dose of Flagyl was still hanging and patient didn't receive dose this am on previous shift. Will continue to give scheduled medication at this time.

## 2015-06-12 DIAGNOSIS — R933 Abnormal findings on diagnostic imaging of other parts of digestive tract: Secondary | ICD-10-CM | POA: Insufficient documentation

## 2015-06-12 DIAGNOSIS — K56609 Unspecified intestinal obstruction, unspecified as to partial versus complete obstruction: Secondary | ICD-10-CM | POA: Insufficient documentation

## 2015-06-12 DIAGNOSIS — K5649 Other impaction of intestine: Secondary | ICD-10-CM

## 2015-06-12 LAB — CBC
HCT: 34.2 % — ABNORMAL LOW (ref 39.0–52.0)
Hemoglobin: 11.2 g/dL — ABNORMAL LOW (ref 13.0–17.0)
MCH: 27.8 pg (ref 26.0–34.0)
MCHC: 32.7 g/dL (ref 30.0–36.0)
MCV: 84.9 fL (ref 78.0–100.0)
Platelets: 205 10*3/uL (ref 150–400)
RBC: 4.03 MIL/uL — ABNORMAL LOW (ref 4.22–5.81)
RDW: 12.7 % (ref 11.5–15.5)
WBC: 5.5 10*3/uL (ref 4.0–10.5)

## 2015-06-12 LAB — COMPREHENSIVE METABOLIC PANEL
ALT: 29 U/L (ref 17–63)
AST: 26 U/L (ref 15–41)
Albumin: 2.8 g/dL — ABNORMAL LOW (ref 3.5–5.0)
Alkaline Phosphatase: 82 U/L (ref 38–126)
Anion gap: 9 (ref 5–15)
BUN: 7 mg/dL (ref 6–20)
CO2: 23 mmol/L (ref 22–32)
Calcium: 8.3 mg/dL — ABNORMAL LOW (ref 8.9–10.3)
Chloride: 106 mmol/L (ref 101–111)
Creatinine, Ser: 1.03 mg/dL (ref 0.61–1.24)
GFR calc Af Amer: >60 mL/min (ref >60)
GFR calc non Af Amer: >60 mL/min (ref >60)
Glucose, Bld: 103 mg/dL — ABNORMAL HIGH (ref 65–99)
Potassium: 3.9 mmol/L (ref 3.5–5.1)
Sodium: 138 mmol/L (ref 135–145)
Total Bilirubin: 0.8 mg/dL (ref 0.3–1.2)
Total Protein: 5.6 g/dL — ABNORMAL LOW (ref 6.5–8.1)

## 2015-06-12 MED ORDER — CIPROFLOXACIN HCL 500 MG PO TABS
500.0000 mg | ORAL_TABLET | Freq: Two times a day (BID) | ORAL | Status: DC
  Administered 2015-06-12 – 2015-06-13 (×3): 500 mg via ORAL
  Filled 2015-06-12 (×3): qty 1

## 2015-06-12 MED ORDER — METRONIDAZOLE 500 MG PO TABS
500.0000 mg | ORAL_TABLET | Freq: Three times a day (TID) | ORAL | Status: DC
  Administered 2015-06-12 – 2015-06-13 (×4): 500 mg via ORAL
  Filled 2015-06-12 (×4): qty 1

## 2015-06-12 NOTE — Progress Notes (Signed)
Pt has a F/U appt with PCP on 07-04-15 at 1030

## 2015-06-12 NOTE — Progress Notes (Signed)
[facility] Progress Note     Subjective: Pt feels much better today.  No N/V, ambulating well.  Thirsty/hungry.  Had 2 BM's and good flatus.  Urinating well.  Objective: Vital signs in last 24 hours: Temp:  [98.6 F (37 C)-98.8 F (37.1 C)] 98.6 F (37 C) (12/12 2120) Pulse Rate:  [82-86] 82 (12/12 2120) Resp:  [16] 16 (12/12 2120) BP: (135-146)/(90-102) 146/90 mmHg (12/12 2120) SpO2:  [100 %] 100 % (12/12 2120) Last BM Date: 06/11/15  Intake/Output from previous day: 12/12 0701 - 12/13 0700 In: 4892.5 [P.O.:40; I.V.:4852.5] Out: 2050 [Urine:2050] Intake/Output this shift:    PE: Gen:  Alert, NAD, pleasant Abd: Soft, NT, less distended, +BS, no HSM   Lab Results:   Recent Labs  06/11/15 0652 06/12/15 0439  WBC 5.9 5.5  HGB 11.2* 11.2*  HCT 34.2* 34.2*  PLT 190 205   BMET  Recent Labs  06/11/15 0652 06/12/15 0439  NA 136 138  K 3.6 3.9  CL 102 106  CO2 27 23  GLUCOSE 125* 103*  BUN 6 7  CREATININE 0.99 1.03  CALCIUM 8.4* 8.3*   PT/INR No results for input(s): LABPROT, INR in the last 72 hours. CMP     Component Value Date/Time   NA 138 06/12/2015 0439   K 3.9 06/12/2015 0439   CL 106 06/12/2015 0439   CO2 23 06/12/2015 0439   GLUCOSE 103* 06/12/2015 0439   BUN 7 06/12/2015 0439   CREATININE 1.03 06/12/2015 0439   CREATININE 1.15 02/06/2014 0816   CALCIUM 8.3* 06/12/2015 0439   PROT 5.6* 06/12/2015 0439   ALBUMIN 2.8* 06/12/2015 0439   AST 26 06/12/2015 0439   ALT 29 06/12/2015 0439   ALKPHOS 82 06/12/2015 0439   BILITOT 0.8 06/12/2015 0439   GFRNONAA >60 06/12/2015 0439   GFRAA >60 06/12/2015 0439   Lipase     Component Value Date/Time   LIPASE 28 06/04/2015 1655       Studies/Results: Dg Abd Portable 1v  06/11/2015  CLINICAL DATA:  Small-bowel obstruction EXAM: PORTABLE ABDOMEN - 1 VIEW COMPARISON:  06/09/2015 FINDINGS: Persistent mildly dilated loops of small bowel are noted in the mid abdomen. Contrast  material is again noted within the colon. No free air is seen. No abnormal mass is noted. Degenerative changes of lumbar spine are seen. IMPRESSION: Persistent mild dilatation of the small bowel. There may be some mild improvement when compared with the prior exam. Electronically Signed   By: Inez Catalina M.D.   On: 06/11/2015 10:42    Anti-infectives: Anti-infectives    Start     Dose/Rate Route Frequency Ordered Stop   06/07/15 1345  ciprofloxacin (CIPRO) IVPB 400 mg     400 mg 200 mL/hr over 60 Minutes Intravenous Every 12 hours 06/07/15 1301     06/07/15 1345  metroNIDAZOLE (FLAGYL) IVPB 500 mg     500 mg 100 mL/hr over 60 Minutes Intravenous Every 8 hours 06/07/15 1301         Assessment/Plan PSBO, ? enteritis LLQ abdominal pain -Had several BM's, no more N/V, advance diet to clears, fulls at lunch -Limit narcotics due to effects on bowels. Robaxin for neck and add heat pad and lidoderm patch -Ambulate and IS -SCD's and heparin -Hopefully home soon when tolerating more solid diet    LOS: 8 days    [name] 06/12/2015, 7:52 AM Pager: [phone]

## 2015-06-12 NOTE — Progress Notes (Signed)
Patient ID: [name], male   DOB: 17-Dec-1945, 69 y.o.   MRN: [id number] [facility] PROGRESS NOTE  Taimur Feicht [id number] DOB: June 17, 1946 DOA: 06/04/2015 PCP: Penni Homans, MD   Brief narrative:    Patient is very pleasant 69 year old male who presented to [facility] for evaluation of several days duration of watery stools, dark in color, epigastric pain, intermittent and throbbing, 3-4/10 in severity and nonradiating. Patient denies similar events in the past, no recent sick contacts or exposures. In the emergency department patient noted to be hemodynamically stable with T 99.4, vital signs otherwise stable, imaging studies concerning for questionable ileus versus enteritis.  Major events since admission: 12/08 - NGT placed, GI consulted 12/09 - surgery consulted  12/11 - NGT removed, more nausea in the afternoon  12/12 - still with nausea and two episodes of vomiting 12/13 - no N/V since yesterday, NGT still out, diet advanced to clears this AM and hopefully to full this afternoon    Assessment/Plan:    Principal Problem:   Loose and dark stools, abdominal pain, N/V  - secondary to small bowel enteritis with PSBO ? Inflammatory vs infectious vs ischemic. + fecal lactoferrin - stool culture and GI panel negative, stool O&P negative  - NGT has been out since 12/11 and pt continues to improve - per surgery team, OK to advance diet to clear liquid this AM and later today to full liquids - Day# 6 Cipro and Flagyl per GI team, I think it would be OK to stop both ABX after tomorrows doses which would complete 7 days of ABX, provided GI team agrees   Active Problems:   Hypokalemia - has been supplemented and remains WNL in the past 48 hours  - repeat again BMP in AM    Hyperlipidemia - Continue pravastatin 40 mg PO QHS per home medical regimen     Hypertension, Essential - Continue Norvasc 5 mg OP QD, metoprolol 50 mg PO QD as per home medical regimen     Obesity (BMI  30.0-34.9) - Body mass index is 30.24 kg/(m^2).  DVT prophylaxis - heparin SQ  Code Status: Full.  Family Communication:  plan of care discussed with the patient and wife at bedside Disposition Plan: Pt will go home when tolerating at least soft diet. Per surgery possibly by 12/14 or 12/15. Pt will not need any additional HH needs. He has been doing excellent with ambulation, no assistance.   IV access:  Peripheral IV  Procedures and diagnostic studies:    Dg Abd 1 View 06/05/2015  No bowel obstruction, oral contrast administered yesterday has reached the rectum. Stable mild gaseous distension of small bowel probably reflecting mild inflammatory ileus.   Ct Abdomen Pelvis W Contrast 06/04/2015  Mid and distal small bowel dilatation, with wall thickening involving a small bowel loop in the anterior lower abdomen. Differential diagnosis includes small bowel ileus versus obstruction, with associated enteritis. Small amount of free fluid in pelvis.  No evidence of abscess.   Dg Abd Portable 1v 06/11/2015  Persistent mild dilatation of the small bowel. There may be some mild improvement when compared with the prior exam.   Dg Abd Portable 1v-small Bowel Obstruction Protocol-initial, 8 Hr Delay 06/09/2015  Suspect partial small bowel obstruction. Recommend abdominal film centered at the diaphragm to optimally evaluate NG tube position.   Medical Consultants:  GI Surgery   Other Consultants:  None  IAnti-Infectives:   Cipro and Flagyl 12/08 -->  MAGICK-Aalaysia Liggins, Rebecca Eaton, MD  [facility] Pager  [phone]  If 7PM-7AM, please contact night-coverage [email or web address] Password [facility] 06/12/2015, 10:56 AM   LOS: 8 days   HPI/Subjective: No events overnight. No N/V since yesterday. Reports feeling better and would like to eat this AM.   Objective: Filed Vitals:   06/10/15 2124 06/11/15 0625 06/11/15 1410 06/11/15 2120  BP: 134/93 130/86 135/102 146/90  Pulse: 87 92 86 82  Temp: 98.9 F (37.2 C)  98.3 F (36.8 C) 98.8 F (37.1 C) 98.6 F (37 C)  TempSrc: Oral  Oral Oral  Resp: 18 19 16 16   Height:      Weight:      SpO2: 100% 100% 100% 100%    Intake/Output Summary (Last 24 hours) at 06/12/15 1056 Last data filed at 06/12/15 0900  Gross per 24 hour  Intake 4852.5 ml  Output   1750 ml  Net 3102.5 ml    Exam:   General:  Pt is alert, follows commands appropriately, not in acute distress  Cardiovascular: Regular rate and rhythm, no rubs, no gallops  Respiratory: Clear to auscultation bilaterally, no wheezing, no crackles, no rhonchi  Abdomen: Soft, non tender, remains slightly distended but soft with bowel sounds present, no guarding   Data Reviewed: Basic Metabolic Panel:  Recent Labs Lab 06/08/15 0548 06/09/15 0357 06/10/15 0455 06/11/15 0652 06/12/15 0439  NA 137 138 138 136 138  K 3.1* 2.9* 3.2* 3.6 3.9  CL 102 102 103 102 106  CO2 27 27 26 27 23   GLUCOSE 93 93 88 125* 103*  BUN 8 10 9 6 7   CREATININE 1.01 1.03 1.05 0.99 1.03  CALCIUM 8.4* 8.5* 8.6* 8.4* 8.3*  MG  --  2.0  --   --   --    Liver Function Tests:  Recent Labs Lab 06/12/15 0439  AST 26  ALT 29  ALKPHOS 82  BILITOT 0.8  PROT 5.6*  ALBUMIN 2.8*   CBC:  Recent Labs Lab 06/07/15 0510 06/08/15 0548 06/09/15 0357 06/11/15 0652 06/12/15 0439  WBC 5.2 5.0 6.0 5.9 5.5  HGB 11.7* 11.3* 11.6* 11.2* 11.2*  HCT 35.1* 34.1* 33.6* 34.2* 34.2*  MCV 84.4 84.4 83.0 84.0 84.9  PLT 180 175 189 190 205   Scheduled Meds: . amLODipine  5 mg Oral Daily  . ciprofloxacin  400 mg Intravenous Q12H  . clobetasol cream  1 application Topical BID  . fluticasone  2 spray Each Nare Daily  . heparin  5,000 Units Subcutaneous 3 times per day  . ketorolac  30 mg Intravenous 4 times per day  . lidocaine  1 patch Transdermal Q24H  . metoprolol  50 mg Oral Daily  . metronidazole  500 mg Intravenous Q8H  . multivitamin with minerals  1 tablet Oral Daily  . pravastatin  40 mg Oral q1800  .  simethicone  80 mg Oral QID  . tamsulosin  0.4 mg Oral Daily  . triamcinolone ointment  1 application Topical BID   Continuous Infusions: . sodium chloride 0.45 % 1,000 mL with potassium chloride 20 mEq infusion 125 mL/hr at 06/12/15 [phone]

## 2015-06-12 NOTE — Progress Notes (Signed)
          Daily Rounding Note  06/12/2015, 8:39 AM  LOS: 8 days   SUBJECTIVE:       Feeling better.  2 large BMs.  No n/v.  Continues to walk in hall circuit frequently.   OBJECTIVE:         Vital signs in last 24 hours:    Temp:  [98.6 F (37 C)-98.8 F (37.1 C)] 98.6 F (37 C) (12/12 2120) Pulse Rate:  [82-86] 82 (12/12 2120) Resp:  [16] 16 (12/12 2120) BP: (135-146)/(90-102) 146/90 mmHg (12/12 2120) SpO2:  [100 %] 100 % (12/12 2120) Last BM Date: 06/12/15 Filed Weights   06/04/15 1635 06/04/15 2237  Weight: 97.07 kg (214 lb) 97 kg (213 lb 13.5 oz)   General: NAD Abdomen: distended but soft and NT, BS + Neuro/Psych:  NL   Lab Results: Reviewed in EMR Studies/Results: Dg Abd Portable 1v  06/11/2015  CLINICAL DATA:  Small-bowel obstruction EXAM: PORTABLE ABDOMEN - 1 VIEW COMPARISON:  06/09/2015 FINDINGS: Persistent mildly dilated loops of small bowel are noted in the mid abdomen. Contrast material is again noted within the colon. No free air is seen. No abnormal mass is noted. Degenerative changes of lumbar spine are seen. IMPRESSION: Persistent mild dilatation of the small bowel. There may be some mild improvement when compared with the prior exam. Electronically Signed   By: Inez Catalina M.D.   On: 06/11/2015 10:42     ASSESMENT:   * SB enteritis with PSBO ? inflammatory vs infectious vs ischemic. + fecal lactoferrin, stool path panel negative. WBCs consistently normal. Pain improved after NGT placed (12/8 - 12/11) .  Day 6 Cipro/flagyl. Still on scheduled Simethicone, IV Protonix.  Films show contrast in colon but still has stable, mild distention SB loops, suggesting PSBO.  NGT removed 12/12 in setting of improved pain, passing loose stool and flatus and decreased NGT output.  * Neck pain. Musculoskeletal.   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  Stop PPI (none PTA).  Surgical PA advanced diet to full liquids.     [name]  06/12/2015, 8:39 AM Pager: [phone]    [facility] Attending   I have taken an interval history, reviewed the chart and examined the patient. I agree with the Advanced Practitioner's note, impression and recommendations.   He is better. I explained that cause of problems not clear.  Anticipate CT-E - most likely as outpatient unless he fails to be ready for DC. Ultimately would like to give this time to resolve if its going to but would do the CT- Could need capsule endo vs surgical evaluation or both  if persistent bowel wall changes seen.  Charles Mayer, MD, [facility] [phone] (pager) [phone] after 5 PM, weekends and holidays  06/12/2015 12:57 PM

## 2015-06-13 ENCOUNTER — Other Ambulatory Visit: Payer: Self-pay

## 2015-06-13 DIAGNOSIS — R112 Nausea with vomiting, unspecified: Secondary | ICD-10-CM

## 2015-06-13 DIAGNOSIS — E876 Hypokalemia: Secondary | ICD-10-CM

## 2015-06-13 DIAGNOSIS — K5000 Crohn's disease of small intestine without complications: Secondary | ICD-10-CM

## 2015-06-13 LAB — BASIC METABOLIC PANEL
Anion gap: 8 (ref 5–15)
BUN: <5 mg/dL — ABNORMAL LOW (ref 6–20)
CO2: 24 mmol/L (ref 22–32)
Calcium: 8.6 mg/dL — ABNORMAL LOW (ref 8.9–10.3)
Chloride: 106 mmol/L (ref 101–111)
Creatinine, Ser: 1.01 mg/dL (ref 0.61–1.24)
GFR calc Af Amer: >60 mL/min (ref >60)
GFR calc non Af Amer: >60 mL/min (ref >60)
Glucose, Bld: 110 mg/dL — ABNORMAL HIGH (ref 65–99)
Potassium: 3.8 mmol/L (ref 3.5–5.1)
Sodium: 138 mmol/L (ref 135–145)

## 2015-06-13 MED ORDER — ACETAMINOPHEN 325 MG PO TABS
650.0000 mg | ORAL_TABLET | ORAL | Status: DC | PRN
  Administered 2015-06-13: 650 mg via ORAL
  Filled 2015-06-13: qty 2

## 2015-06-13 MED ORDER — LIDOCAINE 5 % EX PTCH: 1.0000 | MEDICATED_PATCH | CUTANEOUS | Status: DC

## 2015-06-13 MED ORDER — SIMETHICONE 80 MG PO CHEW: 80.0000 mg | CHEWABLE_TABLET | Freq: Four times a day (QID) | ORAL | Status: DC

## 2015-06-13 NOTE — Progress Notes (Signed)
Patient ID: [name], male   DOB: Jul 10, 1945, 69 y.o.   MRN: [id number]    Subjective: Pt feels well this morning.  Tolerating full liquids with no current issues. No abdominal pain.  Still moving his bowels and no nausea  Objective: Vital signs in last 24 hours: Temp:  [98.1 F (36.7 C)-98.5 F (36.9 C)] 98.4 F (36.9 C) (12/14 0531) Pulse Rate:  [79-85] 79 (12/14 0531) Resp:  [16-18] 18 (12/14 0531) BP: (132-142)/(85-108) 142/85 mmHg (12/14 0531) SpO2:  [98 %-100 %] 98 % (12/14 0531) Last BM Date: 06/13/15  Intake/Output from previous day: 12/13 0701 - 12/14 0700 In: 11186.7 [P.O.:1340; I.V.:6991.7; IV Piggyback:2855] Out: 1200 [Urine:1200] Intake/Output this shift: Total I/O In: -  Out: 350 [Urine:350]  PE: Abd: soft, NT, ND, +BS  Lab Results:   Recent Labs  06/11/15 0652 06/12/15 0439  WBC 5.9 5.5  HGB 11.2* 11.2*  HCT 34.2* 34.2*  PLT 190 205   BMET  Recent Labs  06/12/15 0439 06/13/15 0700  NA 138 138  K 3.9 3.8  CL 106 106  CO2 23 24  GLUCOSE 103* 110*  BUN 7 <5*  CREATININE 1.03 1.01  CALCIUM 8.3* 8.6*   PT/INR No results for input(s): LABPROT, INR in the last 72 hours. CMP     Component Value Date/Time   NA 138 06/13/2015 0700   K 3.8 06/13/2015 0700   CL 106 06/13/2015 0700   CO2 24 06/13/2015 0700   GLUCOSE 110* 06/13/2015 0700   BUN <5* 06/13/2015 0700   CREATININE 1.01 06/13/2015 0700   CREATININE 1.15 02/06/2014 0816   CALCIUM 8.6* 06/13/2015 0700   PROT 5.6* 06/12/2015 0439   ALBUMIN 2.8* 06/12/2015 0439   AST 26 06/12/2015 0439   ALT 29 06/12/2015 0439   ALKPHOS 82 06/12/2015 0439   BILITOT 0.8 06/12/2015 0439   GFRNONAA >60 06/13/2015 0700   GFRAA >60 06/13/2015 0700   Lipase     Component Value Date/Time   LIPASE 28 06/04/2015 1655       Studies/Results: Dg Abd Portable 1v  06/11/2015  CLINICAL DATA:  Small-bowel obstruction EXAM: PORTABLE ABDOMEN - 1 VIEW COMPARISON:  06/09/2015 FINDINGS: Persistent mildly  dilated loops of small bowel are noted in the mid abdomen. Contrast material is again noted within the colon. No free air is seen. No abnormal mass is noted. Degenerative changes of lumbar spine are seen. IMPRESSION: Persistent mild dilatation of the small bowel. There may be some mild improvement when compared with the prior exam. Electronically Signed   By: Inez Catalina M.D.   On: 06/11/2015 10:42    Anti-infectives: Anti-infectives    Start     Dose/Rate Route Frequency Ordered Stop   06/12/15 1500  metroNIDAZOLE (FLAGYL) tablet 500 mg     500 mg Oral Every 8 hours 06/12/15 1253     06/12/15 1300  ciprofloxacin (CIPRO) tablet 500 mg     500 mg Oral 2 times daily 06/12/15 1253     06/07/15 1345  ciprofloxacin (CIPRO) IVPB 400 mg  Status:  Discontinued     400 mg 200 mL/hr over 60 Minutes Intravenous Every 12 hours 06/07/15 1301 06/12/15 1253   06/07/15 1345  metroNIDAZOLE (FLAGYL) IVPB 500 mg  Status:  Discontinued     500 mg 100 mL/hr over 60 Minutes Intravenous Every 8 hours 06/07/15 1301 06/12/15 1253       Assessment/Plan  1. Psbo, ? Secondary to enteritis, resolving -advance to soft, low fiber  diet today.  If he tolerates this, he is surgically stable for dc home.  -GI has recommended an outpatient CT-E, vs capsule, etc.  Will defer this work up to them -no plans for surgical intervention as an inpatient.  We will sign off.  LOS: 9 days    Auda Finfrock E 06/13/2015, 8:39 AM Pager: [phone]

## 2015-06-13 NOTE — Progress Notes (Signed)
Daily Rounding Note  06/13/2015, 10:35 AM  LOS: 9 days   SUBJECTIVE:       Feeling well.  Tolerating fulls.  No abdominal pain, no nausea.  Total of 3 BMs yesterday and this AM are still loose, but less watery.  Surgery has signed off.   OBJECTIVE:         Vital signs in last 24 hours:    Temp:  [98.1 F (36.7 C)-98.5 F (36.9 C)] 98.4 F (36.9 C) (12/14 0531) Pulse Rate:  [79-85] 79 (12/14 0531) Resp:  [16-18] 18 (12/14 0531) BP: (132-142)/(85-108) 142/85 mmHg (12/14 0531) SpO2:  [98 %-100 %] 98 % (12/14 0531) Last BM Date: 06/13/15 Filed Weights   06/04/15 1635 06/04/15 2237  Weight: 97.07 kg (214 lb) 97 kg (213 lb 13.5 oz)   General: pleasant, NAD, looks well.    Heart: RRR Chest: clear bil.   Abdomen: soft, NT, ND.  + BS  Extremities: no CCE Neuro/Psych:  Calm, alert, fully oriented.  No deficits.   Lab Results:  Recent Labs  06/11/15 0652 06/12/15 0439  WBC 5.9 5.5  HGB 11.2* 11.2*  HCT 34.2* 34.2*  PLT 190 205   BMET  Recent Labs  06/11/15 0652 06/12/15 0439 06/13/15 0700  NA 136 138 138  K 3.6 3.9 3.8  CL 102 106 106  CO2 27 23 24   GLUCOSE 125* 103* 110*  BUN 6 7 <5*  CREATININE 0.99 1.03 1.01  CALCIUM 8.4* 8.3* 8.6*   LFT  Recent Labs  06/12/15 0439  PROT 5.6*  ALBUMIN 2.8*  AST 26  ALT 29  ALKPHOS 82  BILITOT 0.8    Studies/Results: Dg Abd Portable 1v  06/11/2015  CLINICAL DATA:  Small-bowel obstruction EXAM: PORTABLE ABDOMEN - 1 VIEW COMPARISON:  06/09/2015 FINDINGS: Persistent mildly dilated loops of small bowel are noted in the mid abdomen. Contrast material is again noted within the colon. No free air is seen. No abnormal mass is noted. Degenerative changes of lumbar spine are seen. IMPRESSION: Persistent mild dilatation of the small bowel. There may be some mild improvement when compared with the prior exam. Electronically Signed   By: Inez Catalina M.D.   On:  06/11/2015 10:42    ASSESMENT:   * SB enteritis with PSBO ? inflammatory vs infectious vs ischemic. + fecal lactoferrin, stool path panel negative. WBCs consistently normal. Pain improved after NGT placed (12/8 - 12/11) .  Day 7 Cipro/flagyl. Still on scheduled Simethicone, IV Protonix.  Films show contrast in colon but still has stable, mild distention SB loops, suggesting PSBO.  NGT removed 12/12 in setting of improved pain, passing loose stool and flatus and decreased NGT output.  * Neck pain. Musculoskeletal.better.   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    PLAN   *  Diet advanced to soft.  If tolerates this can discharge home later today.  Diet at home should be regular but avoid excessive fiber for now.   *  Will arrange for outpt CT enterography, which Dr Carlean Purl will review and decide if he needs to see pt back in the office.  Pt to contact us if recurrent problems. If recurrent severe sxs, should go to ED.     Azucena Freed  06/13/2015, 10:35 AM Pager: [facility] Attending  I have taken an interval history, reviewed the chart and examined the patient. I agree with the Advanced Practitioner's note, impression and recommendations.    Gatha Mayer, MD, [facility] [phone] (pager) [phone] after 5 PM, weekends and holidays  06/13/2015 2:20 PM

## 2015-06-13 NOTE — Progress Notes (Signed)
Discussed discharge summary with patient. Reviewed all medications with patient. Patient ready for discharge.  

## 2015-06-13 NOTE — Discharge Summary (Signed)
Discharge Summary  [name] [id number] DOB: 04-21-1946  PCP: Penni Homans, MD  Admit date: 06/04/2015 Discharge date: 06/13/2015  Time spent: 25 minutes   Recommendations for Outpatient Follow-up:  1. Patient will follow-up with gastroenterology for outpatient CT antero-gram 2. New medication:   Discharge Diagnoses:  Active Hospital Problems   Diagnosis Date Noted  . Generalized abdominal pain   . SBO (small bowel obstruction) ([place])   . Abnormal CT scan, gastrointestinal tract   . Intestinal obstruction ([place])   . Hypokalemia 06/10/2015  . Enteritis   . Loose stools 06/07/2015  . Diarrhea   . Small bowel obstruction ([place]) 06/04/2015  . History of colon polyps   . Obesity (BMI 30.0-34.9)   . Hypertension, essential    . Hyperlipidemia     Resolved Hospital Problems   Diagnosis Date Noted Date Resolved  No resolved problems to display.    Discharge Condition: Improved, being discharged home   Diet recommendation: Soft bland diet   Filed Weights   06/04/15 1635 06/04/15 2237  Weight: 97.07 kg (214 lb) 97 kg (213 lb 13.5 oz)    History of present illness:  69 year old male admitted on 12/5 for several days of watery stools, epigastric pain plus continued nausea and vomiting.   Hospital Course:  Principal Problem:   Generalized abdominal pain: Unclear etiology. NG tube placed on 12/8 as patient could not keep food down. GI consulted. Cultures unrevealing. Possible small bowel enteritis with questionable partial small bowel obstruction. Inflammatory versus ischemic versus infectious. Start on Cipro and Flagyl after positive fecal lactoferrin. Seen by general surgery. NG tube removed on 12/11 and after 24 hours of no nausea and vomiting, started on clear liquids as of 12/13. Diet slowly advanced and patient able tolerate solid foods by 12/14. At this point, GI plans for outpatient CT entero-gram Active Problems:   Hyperlipidemia: Continue statin   Hypertension,  essential : Continue home medications   History of colon polyps   Obesity (BMI 30.0-34.9): Patient meets criteria with BMI greater than 30    Hypokalemia: Potassium replaced    Procedures:  None   Consultations:  GI  Gen. surgery   Discharge Exam: BP 130/85 mmHg  Pulse 86  Temp(Src) 98.4 F (36.9 C) (Oral)  Resp 18  Ht 5' 10.5" (1.791 m)  Wt 97 kg (213 lb 13.5 oz)  BMI 30.24 kg/m2  SpO2 100%  General: Alert and oriented 3, no acute distress  Cardiovascular: Regular rate and rhythm, S1 and S2  Respiratory: Clear to auscultation bilaterally   Discharge Instructions You were cared for by a hospitalist during your hospital stay. If you have any questions about your discharge medications or the care you received while you were in the hospital after you are discharged, you can call the unit and asked to speak with the hospitalist on call if the hospitalist that took care of you is not available. Once you are discharged, your primary care physician will handle any further medical issues. Please note that NO REFILLS for any discharge medications will be authorized once you are discharged, as it is imperative that you return to your primary care physician (or establish a relationship with a primary care physician if you do not have one) for your aftercare needs so that they can reassess your need for medications and monitor your lab values.  Discharge Instructions    Diet - low sodium heart healthy    Complete by:  As directed      Increase  activity slowly    Complete by:  As directed             Medication List    TAKE these medications        amLODipine-benazepril 5-20 MG capsule  Commonly known as:  LOTREL  TAKE 1 CAPSULE DAILY     lidocaine 5 %  Commonly known as:  LIDODERM  Place 1 patch onto the skin daily. Remove & Discard patch within 12 hours or as directed by MD     LIVALO 2 MG Tabs  Generic drug:  Pitavastatin Calcium  TAKE 1 TABLET DAILY     metoprolol  50 MG tablet  Commonly known as:  LOPRESSOR  TAKE 1 TABLET DAILY     simethicone 80 MG chewable tablet  Commonly known as:  MYLICON  Chew 1 tablet (80 mg total) by mouth 4 (four) times daily.       No Known Allergies     Follow-up Information    Follow up with Penni Homans, MD In 1 month.   Specialty:  Family Medicine   Why:  As needed   Contact information:   [place] [address] [phone]       Follow up with Silvano Rusk, MD.   Specialty:  Gastroenterology   Why:  Office will call you to schedule an outpatient CT enterogram   Contact information:   [address] [place] [address] [phone]        The results of significant diagnostics from this hospitalization (including imaging, microbiology, ancillary and laboratory) are listed below for reference.    Significant Diagnostic Studies: Dg Abd 1 View  06/05/2015  CLINICAL DATA:  69 year old male with generalized abdominal pain for 4 days. Abnormal distal small bowel on CT. Initial encounter. EXAM: ABDOMEN - 1 VIEW COMPARISON:  CT Abdomen and Pelvis 06/04/2015. FINDINGS: Supine view at 0726 hours. Oral contrast administered yesterday now is present in the distal colon and rectum. Continued upper limits of normal to mildly enlarged gas-filled small bowel in the left and mid abdomen. Mild gaseous distension of the stomach persists. Abdominal visceral contours are stable. Stable visualized osseous structures. No definite pneumoperitoneum on this supine view. IMPRESSION: No bowel obstruction, oral contrast administered yesterday has reached the rectum. Stable mild gaseous distension of small bowel probably reflecting mild inflammatory ileus. Electronically Signed   By: Genevie Ann M.D.   On: 06/05/2015 07:55   Ct Abdomen Pelvis W Contrast  06/04/2015  CLINICAL DATA:  Generalized abdominal pain and distention for 3 days. Diarrhea. EXAM: CT ABDOMEN AND PELVIS WITH CONTRAST TECHNIQUE:  Multidetector CT imaging of the abdomen and pelvis was performed using the standard protocol following bolus administration of intravenous contrast. CONTRAST:  10mL OMNIPAQUE IOHEXOL 300 MG/ML SOLN, 86mL OMNIPAQUE IOHEXOL 300 MG/ML SOLN COMPARISON:  None. FINDINGS: Lower chest:  No acute findings. Hepatobiliary: Mild hepatic steatosis noted. Gallbladder is unremarkable. Pancreas: No mass, inflammatory changes, or other significant abnormality. Spleen: Within normal limits in size and appearance. Adrenals/Urinary Tract: Bilateral renal cysts noted. Tiny 2 mm nonobstructive right renal calculus seen. No evidence of ureteral calculi or hydronephrosis. Stomach/Bowel: Multiple dilated mid and distal small bowel loops are seen. Wall thickening is seen involving a loop of small bowel in the anterior lower abdomen. Adjacent soft tissue stranding seen in the mesenteric fat and small amount of free fluid noted in the pelvis. Normal appendix visualized. No evidence of colonic dilatation or wall thickening. Vascular/Lymphatic: No  pathologically enlarged lymph nodes. No evidence of abdominal aortic aneurysm. Reproductive: No mass or other significant abnormality. Other: Small paraumbilical hernia containing only fat. No herniated bowel loops. Musculoskeletal:  No suspicious bone lesions identified. IMPRESSION: Mid and distal small bowel dilatation, with wall thickening involving a small bowel loop in the anterior lower abdomen. Differential diagnosis includes small bowel ileus versus obstruction, with associated enteritis. Small amount of free fluid in pelvis.  No evidence of abscess. Tiny nonobstructive right renal calculus. Electronically Signed   By: Earle Gell M.D.   On: 06/04/2015 18:36   Dg Abd Portable 1v  06/11/2015  CLINICAL DATA:  Small-bowel obstruction EXAM: PORTABLE ABDOMEN - 1 VIEW COMPARISON:  06/09/2015 FINDINGS: Persistent mildly dilated loops of small bowel are noted in the mid abdomen. Contrast material  is again noted within the colon. No free air is seen. No abnormal mass is noted. Degenerative changes of lumbar spine are seen. IMPRESSION: Persistent mild dilatation of the small bowel. There may be some mild improvement when compared with the prior exam. Electronically Signed   By: Inez Catalina M.D.   On: 06/11/2015 10:42   Dg Abd Portable 1v-small Bowel Obstruction Protocol-initial, 8 Hr Delay  06/09/2015  CLINICAL DATA:  Small-bowel obstruction EXAM: PORTABLE ABDOMEN - 1 VIEW COMPARISON:  06/07/2015 FINDINGS: Oral contrast is administered and extends throughout the entire large bowel. There remain mildly dilated loops of central small bowel. The degree of distention is similar, with maximal diameter of about 4.7 cm. An NG tube projects over the left lung base/left upper quadrant. Position is not well evaluated as it is at the edge of the film. IMPRESSION: Suspect partial small bowel obstruction. Recommend abdominal film centered at the diaphragm to optimally evaluate NG tube position. Electronically Signed   By: Skipper Cliche M.D.   On: 06/09/2015 19:06   Dg Abd Portable 2v  06/07/2015  CLINICAL DATA:  69 year old male with abdominal pain for 5 days. Diarrhea prior to coming to the hospital. Hypertension. Insert follow-up EXAM: PORTABLE ABDOMEN - 2 VIEW COMPARISON:  06/15/2015 plain film exam and 06/04/2015 CT. FINDINGS: Gas distended small bowel loops measuring up to 4.2 cm versus prior 3.9 cm suggestive of partial small bowel obstruction with gas seen within portions of colon. No free intraperitoneal air detected on decubitus view. IMPRESSION: Partial small bowel obstruction suspected as noted above. Electronically Signed   By: Genia Del M.D.   On: 06/07/2015 12:06    Microbiology: Recent Results (from the past 240 hour(s))  Stool culture     Status: None   Collection Time: 06/06/15  2:02 PM  Result Value Ref Range Status   Specimen Description STOOL  Final   Special Requests NONE  Final     Culture   Final    NO SALMONELLA, SHIGELLA, CAMPYLOBACTER, YERSINIA, OR E.COLI 0157:H7 ISOLATED Performed at [facility]    Report Status 06/10/2015 FINAL  Final  OVA + PARASITE EXAM     Status: None   Collection Time: 06/06/15  2:03 PM  Result Value Ref Range Status   OVA + PARASITE EXAM Final report  Final    Comment: (NOTE) These results were obtained using wet preparation(s) and trichrome stained smear. This test does not include testing for Cryptosporidium parvum, Cyclospora, or Microsporidia. Performed At: [facility] [place], [place] [id number] Caprice Red MD [id number]    Source of Sample STOOL  Final     Labs: Basic Metabolic Panel:  Recent  Labs Lab 06/09/15 0357 06/10/15 0455 06/11/15 0652 06/12/15 0439 06/13/15 0700  NA 138 138 136 138 138  K 2.9* 3.2* 3.6 3.9 3.8  CL 102 103 102 106 106  CO2 27 26 27 23 24   GLUCOSE 93 88 125* 103* 110*  BUN 10 9 6 7  <5*  CREATININE 1.03 1.05 0.99 1.03 1.01  CALCIUM 8.5* 8.6* 8.4* 8.3* 8.6*  MG 2.0  --   --   --   --    Liver Function Tests:  Recent Labs Lab 06/12/15 0439  AST 26  ALT 29  ALKPHOS 82  BILITOT 0.8  PROT 5.6*  ALBUMIN 2.8*   No results for input(s): LIPASE, AMYLASE in the last 168 hours. No results for input(s): AMMONIA in the last 168 hours. CBC:  Recent Labs Lab 06/07/15 0510 06/08/15 0548 06/09/15 0357 06/11/15 0652 06/12/15 0439  WBC 5.2 5.0 6.0 5.9 5.5  HGB 11.7* 11.3* 11.6* 11.2* 11.2*  HCT 35.1* 34.1* 33.6* 34.2* 34.2*  MCV 84.4 84.4 83.0 84.0 84.9  PLT 180 175 189 190 205   Cardiac Enzymes: No results for input(s): CKTOTAL, CKMB, CKMBINDEX, TROPONINI in the last 168 hours. BNP: BNP (last 3 results) No results for input(s): BNP in the last 8760 hours.  ProBNP (last 3 results) No results for input(s): PROBNP in the last 8760 hours.  CBG: No results for input(s): GLUCAP in the last 168 hours.     Signed:  Annita Brod  [facility] 06/13/2015, 5:49 PM

## 2015-06-15 ENCOUNTER — Telehealth: Payer: Self-pay | Admitting: Behavioral Health

## 2015-06-15 NOTE — Telephone Encounter (Signed)
Transition Care Management Follow-up Telephone Call   Date discharged? 06/13/15   How have you been since you were released from the hospital? Patient reported that he's doing pretty good for the most part, but a little concern that he has not had a bowel movement. Also, he voiced that he's having some pain in his lower abdomen and groin area.   Do you understand why you were in the hospital? yes, patient verbalized that he had an obstruction of his bowels.   Do you understand the discharge instructions? yes   Where were you discharged to? Home   Items Reviewed:  Medications reviewed: yes  Allergies reviewed: yes  Dietary changes reviewed: yes, soft bland diet  Referrals reviewed: yes, follow-up with PCP in a month and Dr. Carlean Purl at Gastroenterology   Functional Questionnaire:   Activities of Daily Living (ADLs):   He states they are independent in the following: ambulation, bathing and hygiene, feeding, continence, grooming, toileting and dressing States they require assistance with the following: None   Any transportation issues/concerns?: no   Any patient concerns? yes, patient voiced that he has not had a bowel movement since the time he's discharged from the hospital   Confirmed importance and date/time of follow-up visits scheduled yes, 07/04/15 at 10:30 AM  Provider Appointment booked with Mackie Pai, PA-C.   Confirmed with patient if condition begins to worsen call PCP or go to the ER.  Patient was given the office number and encouraged to call back with question or concerns.  : yes

## 2015-07-04 ENCOUNTER — Ambulatory Visit: Payer: Medicare Other | Admitting: Medical

## 2015-07-05 ENCOUNTER — Encounter: Payer: Self-pay | Admitting: Medical

## 2015-07-05 ENCOUNTER — Ambulatory Visit (INDEPENDENT_AMBULATORY_CARE_PROVIDER_SITE_OTHER): Payer: Medicare Other | Admitting: Medical

## 2015-07-05 VITALS — BP 116/70 | HR 71 | Temp 98.4°F | Ht 71.0 in | Wt 206.0 lb

## 2015-07-05 DIAGNOSIS — R1012 Left upper quadrant pain: Secondary | ICD-10-CM

## 2015-07-05 DIAGNOSIS — K649 Unspecified hemorrhoids: Secondary | ICD-10-CM | POA: Diagnosis not present

## 2015-07-05 DIAGNOSIS — L989 Disorder of the skin and subcutaneous tissue, unspecified: Secondary | ICD-10-CM

## 2015-07-05 MED ORDER — HYDROCORTISONE ACETATE 25 MG RE SUPP: 25.0000 mg | Freq: Two times a day (BID) | RECTAL | Status: DC

## 2015-07-05 MED ORDER — SIMETHICONE 80 MG PO CHEW: 80.0000 mg | CHEWABLE_TABLET | Freq: Four times a day (QID) | ORAL | Status: DC

## 2015-07-05 MED FILL — ANUCORT-HC 25 MG SUPPOSITOR: 25 | 6 days supply | Qty: 12 | Fill #0

## 2015-07-05 NOTE — Progress Notes (Signed)
Pre visit review using our clinic review tool, if applicable. No additional management support is needed unless otherwise documented below in the visit note. 

## 2015-07-05 NOTE — Progress Notes (Signed)
Subjective:    Patient ID: [name], male    DOB: [birth year], 71 y.o.   MRN: [id number]  HPI Generalized abdominal pain: Unclear etiology. NG tube placed on 12/8 as patient could not keep food down. GI consulted. Cultures unrevealing. Possible small bowel enteritis with questionable partial small bowel obstruction. Inflammatory versus ischemic versus infectious. Start on Cipro and Flagyl after positive fecal lactoferrin. Seen by general surgery. NG tube removed on 12/11 and after 24 hours of no nausea and vomiting, started on clear liquids as of 12/13. Diet slowly advanced and patient able tolerate solid foods by 12/14. At this point, GI plans for outpatient CT entero-gram Active Problems:  Hyperlipidemia: Continue statin  Hypertension, essential : Continue home medications  History of colon polyps  Obesity (BMI 30.0-34.9): Patient meets criteria with BMI greater than 30   Hypokalemia: Potassium replaced(prior to dischare was resolved) Also prior to dc no elevated wbc.  Above is hospital course.  Pt states he overall feels better. He states can hear his stomach rumble at times. Occasional he transient sharp pain. Will last second or so. He states not pattern to pain and will occur rare. No pain today. Pt states he had bowel every day. Usually in am. Some days have more than one stool. But no diarrhea. Discharge on 06-13-2015.   Pt is taking simethicon chew tab 80 mg qid. But [facility] and [facility] are not [facility] the meds.  Pt has used some occasional miralax about every 3-4 days.    Pt has no fever, no chills,no nausea or vomiting.  Pt had colonosocpy by Dr. Rigoberto Noel this past   year.   End noted rt side of neck lump that has brown and become prominent over past 2 months. Pt is scheduled to have CT scan on Tuesday.      Review of Systems  Constitutional: Negative for fever, chills, diaphoresis, activity change and fatigue.  Respiratory: Negative for  cough, chest tightness and shortness of breath.   Cardiovascular: Negative for chest pain, palpitations and leg swelling.  Gastrointestinal: Negative for nausea, vomiting, abdominal pain, diarrhea, constipation and blood in stool.       See hpi. No current pain  Musculoskeletal: Negative for neck pain and neck stiffness.  Skin:       See hpi.  Neurological: Negative for dizziness, seizures, weakness and headaches.  Psychiatric/Behavioral: Negative for behavioral problems, confusion and agitation. The patient is not nervous/anxious.     Past Medical History  Diagnosis Date  . Vitamin D deficiency   . Back pain 10/10/2012  . Arthritis   . Hyperlipidemia   . Hypertension   . Obesity (BMI 30.0-34.9)   . Osteoporosis   . Anemia   . Nocturia 08/21/2013  . Hyperglycemia 08/21/2013  . Unspecified constipation 02/06/2014  . 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 Rectal bleeding 07/30/2014  . Anemia 08/14/2014  . Anxiety   . Hemorrhoids, internal, with bleeding 2005    internal and external. 2005 band ligation (dr Ferdinand Lango in [facility])  . Fatty liver 2005    seen on 2005 ultrasound,05/2015 CT.     Social History   Social History  . Marital Status: Married    Spouse Name: N/A  . Number of Children: 1  . Years of Education: N/A   Occupational History  . Retired [employer]   Social History Main Topics  . Smoking status: Never Smoker   . Smokeless tobacco: Never Used  .  Alcohol Use: 1.2 oz/week    2 Standard drinks or equivalent per week     Comment: Occassionally  . Drug Use: No  . Sexual Activity: No   Other Topics Concern  . Not on file   Social History Narrative    Past Surgical History  Procedure Laterality Date  . Tonsillectomy  1957  . Fracture surgery Left 1989    leg  . Knee surgery Left 2010    arthroscopy, torn carilage  . Knee surgery Right 2012    arthroscopy  . Hemorrhoid banding  2005  . Colonoscopy  before 2005, 2012, 2013,  08/2014.     Family History  Problem Relation Age of Onset  . Stroke Mother   . Aneurysm Mother     brain  . Arthritis Father   . Liver cancer Father     liver, atomic testing in [employer]  . Gallbladder disease Father   . Hyperlipidemia Sister   . Hypertension Sister   . Obesity Sister   . Diabetes Sister   . Diabetes Brother   . Hyperlipidemia Sister   . Hypertension Sister   . Obesity Sister   . Diabetes Sister   . Aneurysm Brother     brain  . COPD Brother   . Colon cancer Neg Hx   . Colon polyps Neg Hx   . Esophageal cancer Neg Hx   . Rectal cancer Neg Hx   . Stomach cancer Neg Hx   . Gallbladder disease Sister     x4    No Known Allergies  Current Outpatient Prescriptions on File Prior to Visit  Medication Sig Dispense Refill  . amLODipine-benazepril (LOTREL) 5-20 MG capsule TAKE 1 CAPSULE DAILY 90 capsule 0  . lidocaine (LIDODERM) 5 % Place 1 patch onto the skin daily. Remove & Discard patch within 12 hours or as directed by MD 30 patch 0  . LIVALO 2 MG TABS TAKE 1 TABLET DAILY 90 tablet 0  . metoprolol (LOPRESSOR) 50 MG tablet TAKE 1 TABLET DAILY 90 tablet 0   No current facility-administered medications on file prior to visit.    BP 116/70 mmHg  Pulse 71  Temp(Src) 98.4 F (36.9 C) (Oral)  Ht 5\' 11"  (1.803 m)  Wt 206 lb (93.441 kg)  BMI 28.74 kg/m2  SpO2 98%       Objective:   Physical Exam  General Appearance- Not in acute distress.  Neck- rt side of neck/posterior aspect 10 mm hyperpigmented skin lesion. With feel of sebacious cyst or lipoma underneath.  HEENT Eyes- Scleraeral/Conjuntiva-bilat- Not Yellow. Mouth & Throat- Normal.  Chest and Lung Exam Auscultation: Breath sounds:-Normal. Adventitious sounds:- No Adventitious sounds.  Cardiovascular Auscultation:Rythm - Regular. Heart Sounds -Normal heart sounds.  Abdomen Inspection:-Inspection Normal.  Palpation/Perucssion: Palpation and Percussion of the abdomen reveal- Non  Tender, No Rebound tenderness, No rigidity(Guarding) and No Palpable abdominal masses.  Liver:-Normal.  Spleen:- Normal.        Assessment & Plan:  Abdomen pain. Improved and having bms. Please get ct on Tuesday. Will refer to Dr. Carlean Purl. If you have flare of  Sever abdomen pain during interim or constipation for more than 2 days then ED evaluation.  Rx simethicone today. Can get down stairs.  If no bm daily can use miralax.  Follow up with GI hopefully next week. If not able to see them next week then reschedule with me.  Rx annusol for occasonal flare external hemorrhoid he mentioned during interview.   Refer to  dermatologist for skin lesion rt side of neck.

## 2015-07-05 NOTE — Patient Instructions (Addendum)
Abdomen pain. Improved and having bms. Please get ct on Tuesday. Will refer to Dr. Carlean Purl. If you have flare of  Sever abdomen pain during interim or constipation for more than 2 days then ED evaluation.  Rx simethicone today. Can get down stairs.  If no bm daily can use miralax.  Follow up with GI hopefully next week. If not able to see them next week then reschedule with me.  Rx annusol for occasonal flare external hemorrhoid he mentioned during interview.   Refer to dermatologist for skin lesion rt side of neck.

## 2015-07-06 ENCOUNTER — Encounter: Payer: Self-pay | Admitting: Family Medicine

## 2015-07-10 ENCOUNTER — Ambulatory Visit (INDEPENDENT_AMBULATORY_CARE_PROVIDER_SITE_OTHER)
Admit: 2015-07-10 | Discharge: 2015-07-10 | Disposition: A | Discharge status: Home / Self Care | Payer: Medicare Other | Attending: Internal Medicine | Admitting: Internal Medicine

## 2015-07-10 DIAGNOSIS — K5000 Crohn's disease of small intestine without complications: Secondary | ICD-10-CM | POA: Diagnosis not present

## 2015-07-10 DIAGNOSIS — N200 Calculus of kidney: Secondary | ICD-10-CM | POA: Diagnosis not present

## 2015-07-10 MED ORDER — IOHEXOL 300 MG/ML  SOLN
100.0000 mL | Freq: Once | INTRAMUSCULAR | Status: AC | PRN
  Administered 2015-07-10: 100 mL via INTRAVENOUS

## 2015-07-11 NOTE — Progress Notes (Signed)
Quick Note:  Let him know this looks ok - intestinal problems resolved   Fatty liver seen - not causing problems  i see that he is being referred back by PCP from 1/5 note - I think for LUQ pain  It seemed like he was improved overall, however. Please sort out and arrange f/u me/APP    ______

## 2015-07-12 ENCOUNTER — Other Ambulatory Visit (INDEPENDENT_AMBULATORY_CARE_PROVIDER_SITE_OTHER): Payer: Medicare Other

## 2015-07-12 ENCOUNTER — Ambulatory Visit (INDEPENDENT_AMBULATORY_CARE_PROVIDER_SITE_OTHER): Payer: Medicare Other | Admitting: Physician Assistant

## 2015-07-12 ENCOUNTER — Encounter: Payer: Self-pay | Admitting: Physician Assistant

## 2015-07-12 VITALS — BP 122/82 | HR 76 | Ht 70.0 in | Wt 206.0 lb

## 2015-07-12 DIAGNOSIS — R1012 Left upper quadrant pain: Secondary | ICD-10-CM

## 2015-07-12 DIAGNOSIS — R1032 Left lower quadrant pain: Secondary | ICD-10-CM

## 2015-07-12 DIAGNOSIS — R143 Flatulence: Secondary | ICD-10-CM | POA: Diagnosis not present

## 2015-07-12 DIAGNOSIS — K529 Noninfective gastroenteritis and colitis, unspecified: Secondary | ICD-10-CM

## 2015-07-12 DIAGNOSIS — R140 Abdominal distension (gaseous): Secondary | ICD-10-CM

## 2015-07-12 DIAGNOSIS — IMO0001 Reserved for inherently not codable concepts without codable children: Secondary | ICD-10-CM

## 2015-07-12 LAB — CBC WITH DIFFERENTIAL/PLATELET
Basophils Absolute: 0 10*3/uL (ref 0.0–0.1)
Basophils Relative: 0.4 % (ref 0.0–3.0)
Eosinophils Absolute: 0.4 10*3/uL (ref 0.0–0.7)
Eosinophils Relative: 9.3 % — ABNORMAL HIGH (ref 0.0–5.0)
HCT: 41.7 % (ref 39.0–52.0)
Hemoglobin: 13.8 g/dL (ref 13.0–17.0)
Lymphocytes Relative: 37.6 % (ref 12.0–46.0)
Lymphs Abs: 1.7 10*3/uL (ref 0.7–4.0)
MCHC: 33 g/dL (ref 30.0–36.0)
MCV: 84.4 fl (ref 78.0–100.0)
Monocytes Absolute: 0.5 10*3/uL (ref 0.1–1.0)
Monocytes Relative: 10.7 % (ref 3.0–12.0)
Neutro Abs: 2 10*3/uL (ref 1.4–7.7)
Neutrophils Relative %: 42 % — ABNORMAL LOW (ref 43.0–77.0)
Platelets: 183 10*3/uL (ref 150.0–400.0)
RBC: 4.94 Mil/uL (ref 4.22–5.81)
RDW: 13.3 % (ref 11.5–15.5)
WBC: 4.7 10*3/uL (ref 4.0–10.5)

## 2015-07-12 LAB — IGA: IgA: 174 mg/dL (ref 68–378)

## 2015-07-12 LAB — C-REACTIVE PROTEIN: CRP: 0.1 mg/dL — ABNORMAL LOW (ref 0.5–20.0)

## 2015-07-12 LAB — SEDIMENTATION RATE: Sed Rate: 11 mm/hr (ref 0–22)

## 2015-07-12 NOTE — Progress Notes (Signed)
Patient ID: Torris House, male   DOB: 1946/05/06, 70 y.o.   MRN: [id number]   Subjective:    Patient ID: Micholas Drumwright, male    DOB: [birth year], 70 y.o.   MRN: [id number]  HPI  Mikey is a pleasant 70 year old African-American male known to Dr. Carlean Purl. He has history of adenomatous colon polyps and undergone colonoscopy in February 2016 with finding of 2 small adenomatous polyps which were removed. Patient had a hospitalization in December 2016 for abdominal pain, distention and loose stools. CT scan of the abdomen and pelvis done on admission showed mid and distal small bowel dilation with wall thickening involving a loop of small bowel in the anterior lower abdomen felt most consistent with partial obstruction secondary to an enteritis. Patient empirically was treated with Cipro and Flagyl, stool cultures were negative and he gradually symptomatically improved though says he was still uncomfortable on discharge.   He was then set up for CT enterography which was done on 07/10/2015. This shows diffuse hepatic steatosis and the thickening of the loops of small bowel were no longer present.  He comes in today for follow-up stating that he feels better than when he was hospitalized and has not had any nausea or vomiting. His appetite has been okay but he has been eating purposefully smaller meals. He continues to have discomfort in his left abdomen and at times gets a sharper pain in the left abdomen. He also has a lot of ongoing gas and bloating. Says he feels worse usually early in the morning with a lot of rumbling and gas in his abdomen and notices the left-sided discomfort more at that time. He is using MiraLAX and prune juice for bowel movements as he is afraid did not keep his bowels moving. No fever or chills.  Review of Systems Pertinent positive and negative review of systems were noted in the above HPI section.  All other review of systems was otherwise negative.  Outpatient Encounter  Prescriptions as of 07/12/2015  Medication Sig  . amLODipine-benazepril (LOTREL) 5-20 MG capsule TAKE 1 CAPSULE DAILY  . hydrocortisone (ANUSOL-HC) 25 MG suppository Place 1 suppository (25 mg total) rectally 2 (two) times daily.  Marland Kitchen lidocaine (LIDODERM) 5 % Place 1 patch onto the skin daily. Remove & Discard patch within 12 hours or as directed by MD  . LIVALO 2 MG TABS TAKE 1 TABLET DAILY  . metoprolol (LOPRESSOR) 50 MG tablet TAKE 1 TABLET DAILY  . simethicone (MYLICON) 80 MG chewable tablet Chew 1 tablet (80 mg total) by mouth 4 (four) times daily.   No facility-administered encounter medications on file as of 07/12/2015.   No Known Allergies Patient Active Problem List   Diagnosis Date Noted  . SBO (small bowel obstruction) ([place])   . Abnormal CT scan, gastrointestinal tract   . Intestinal obstruction ([place])   . Hypokalemia 06/10/2015  . Enteritis   . Loose stools 06/07/2015  . Diarrhea   . Generalized abdominal pain   . Small bowel obstruction ([place]) 06/04/2015  . Hyperlipidemia   . Hypertension, essential    . History of colon polyps   . Obesity (BMI 30.0-34.9)    Social History   Social History  . Marital Status: Married    Spouse Name: N/A  . Number of Children: 1  . Years of Education: N/A   Occupational History  . Retired [employer]   Social History Main Topics  . Smoking status: Never Smoker   . Smokeless tobacco: Never Used  .  Alcohol Use: 1.2 oz/week    2 Standard drinks or equivalent per week     Comment: Occassionally  . Drug Use: No  . Sexual Activity: No   Other Topics Concern  . Not on file   Social History Narrative    Mr. Demas family history includes Aneurysm in his brother and mother; Arthritis in his father; COPD in his brother; Diabetes in his brother, sister, and sister; Gallbladder disease in his father and sister; Hyperlipidemia in his sister and sister; Hypertension in his sister and sister; Liver cancer in his father; Obesity in  his sister and sister; Stroke in his mother. There is no history of Colon cancer, Colon polyps, Esophageal cancer, Rectal cancer, or Stomach cancer.      Objective:    Filed Vitals:   07/12/15 0855  BP: 122/82  Pulse: 76    Physical Exam  well-developed older African-American male in no acute distress, pleasant accompanied by his wife blood pressure 122/82 pulse 76 height 5 foot 10 weight 206. HEENT; nontraumatic normocephalic EOMI PERRLA sclera anicteric, Cardiovascular; regular rate and rhythm with S1-S2 no murmur or gallop, Pulmonary; clear bilaterally, Abdomen ;soft , bowel sounds are present there is no palpable mass or hepatosplenomegaly is tender in in the left mid and left lower abdomen just to the left of the umbilicus , Rectal ;exam not done, extremities no clubbing cyanosis or edema skin warm and dry, Neuropsych; mood and affect appropriate      Assessment & Plan:   #1 70 yo  Male with recent hospitalization for abdominal pain, distention, loose stool with Ct showing findings consistent with a distal small bowel enteritis with partial SBO Negative CT enterography 2 days ago, however pt continues to have left sided abdominal discomfort , gas and bloating. Etiology not clear - could be post infectious IBS picture but concerned with persistent pain- r/o underlying IBD, vasculitis,occult lesion #2 hx adenomatous polyps- 07/2014 #3 HTN #4 hyperlipidemia #5 mild anemia  Plan; Will check cbc, BMET, ESR,CRP, ANA  proceed with capsule endoscopy which was discussed in detail with pt and wife  continue daily probiotic Continue prn miralax, and add daily fiber supp.   Amy Genia Harold PA-C 07/12/2015   Cc: Mosie Lukes, MD

## 2015-07-12 NOTE — Patient Instructions (Signed)
Please go to the basement level to have your labs drawn.  Continue probiotic daily. Fiber supplement daily. Take Miralax as needed.  You have been instructed and scheduled for a capsule Endoscopy.

## 2015-07-13 LAB — TISSUE TRANSGLUTAMINASE, IGG: t-Transglutaminase (tTG) IgG: 1 U/mL

## 2015-07-13 LAB — ANA: Anti Nuclear Antibody(ANA): NEGATIVE

## 2015-07-16 NOTE — Progress Notes (Signed)
Agree with Ms. Esterwood's assessment and plan. Lando Alcalde E. Haward Pope, MD, FACG   

## 2015-07-19 DIAGNOSIS — L821 Other seborrheic keratosis: Secondary | ICD-10-CM | POA: Diagnosis not present

## 2015-07-19 DIAGNOSIS — L720 Epidermal cyst: Secondary | ICD-10-CM | POA: Diagnosis not present

## 2015-07-20 ENCOUNTER — Ambulatory Visit (INDEPENDENT_AMBULATORY_CARE_PROVIDER_SITE_OTHER): Payer: Medicare Other | Admitting: Internal Medicine

## 2015-07-20 DIAGNOSIS — R933 Abnormal findings on diagnostic imaging of other parts of digestive tract: Secondary | ICD-10-CM

## 2015-07-20 NOTE — Progress Notes (Signed)
Patient here for capsule endoscopy.Patient completed prep and has not eaten. Tolerated procedure. Verbalizes understanding of written and verbal instructions. Capsule lot-[id number] Expires 2017-12.

## 2015-07-27 ENCOUNTER — Telehealth: Payer: Self-pay | Admitting: *Deleted

## 2015-07-27 NOTE — Telephone Encounter (Signed)
Patient did pass the capsule per his wife.

## 2015-07-27 NOTE — Telephone Encounter (Signed)
-----   Message from Hulan Saas, RN sent at 07/20/2015  4:04 PM EST ----- Call and see if passed capsule.

## 2015-07-27 NOTE — Telephone Encounter (Signed)
Left a message for patient to call back. 

## 2015-07-31 ENCOUNTER — Telehealth: Payer: Self-pay

## 2015-07-31 ENCOUNTER — Encounter: Payer: Self-pay | Admitting: Internal Medicine

## 2015-07-31 NOTE — Telephone Encounter (Signed)
-----   Message from Gatha Mayer, MD sent at 07/30/2015  4:53 PM EST ----- Regarding: capsule f/u He needs a KUB unless he knows he passed capsule as study was incomplete - and no problems seen Please let me know how he is

## 2015-07-31 NOTE — Telephone Encounter (Signed)
Not sure that is related to GI issue I suggest he seek f/u PCP to examine that and I will be on standby

## 2015-07-31 NOTE — Telephone Encounter (Signed)
I spoke with his wife.  He has passed the capsule.  His wife reports that his pain has improved except for the one area on his left side "around his rib area".  She reports that he is tender to the touch in this location "we can feel a lump".

## 2015-07-31 NOTE — Telephone Encounter (Signed)
Left message for patient to call back  

## 2015-08-01 NOTE — Telephone Encounter (Signed)
Patient notified

## 2015-08-09 ENCOUNTER — Encounter: Payer: Self-pay | Admitting: Family Medicine

## 2015-08-09 ENCOUNTER — Ambulatory Visit (INDEPENDENT_AMBULATORY_CARE_PROVIDER_SITE_OTHER): Payer: Medicare Other | Admitting: Family Medicine

## 2015-08-09 VITALS — BP 144/80 | HR 81 | Temp 99.8°F | Wt 213.4 lb

## 2015-08-09 DIAGNOSIS — J0140 Acute pansinusitis, unspecified: Secondary | ICD-10-CM | POA: Diagnosis not present

## 2015-08-09 DIAGNOSIS — J209 Acute bronchitis, unspecified: Secondary | ICD-10-CM

## 2015-08-09 MED ORDER — AMOXICILLIN-POT CLAVULANATE 875-125 MG PO TABS: 1.0000 | ORAL_TABLET | Freq: Two times a day (BID) | ORAL | Status: DC

## 2015-08-09 MED ORDER — PROMETHAZINE-DM 6.25-15 MG/5ML PO SYRP: 5.0000 mL | ORAL_SOLUTION | Freq: Four times a day (QID) | ORAL | Status: DC | PRN

## 2015-08-09 MED ORDER — FLUTICASONE PROPIONATE 50 MCG/ACT NA SUSP: 2.0000 | Freq: Every day | NASAL | Status: DC

## 2015-08-09 MED FILL — PROMETHAZINE-DM SYRUP: 6.25-15 | 6 days supply | Qty: 118 | Fill #0

## 2015-08-09 MED FILL — FLUTICASONE PROP 50 MCG SPR: 50 | 30 days supply | Qty: 16 | Fill #0

## 2015-08-09 MED FILL — AMOX-CLAV 875-125 MG TABLET: 875-125 | 10 days supply | Qty: 20 | Fill #0

## 2015-08-09 NOTE — Progress Notes (Signed)
  Subjective:     [name] is a 70 y.o. male who presents for evaluation of sinus pain. Symptoms include: cough, fevers, headaches, nasal congestion, purulent rhinorrhea, sinus pressure and sneezing. Onset of symptoms was 1 week ago. Symptoms have been gradually worsening since that time. Past history is significant for no history of pneumonia or bronchitis. Patient is a non-smoker.  The following portions of the patient's history were reviewed and updated as appropriate: allergies, current medications, past family history, past medical history, past social history, past surgical history and problem list.  Review of Systems Pertinent items are noted in HPI.   Objective:    BP 144/80 mmHg  Pulse 81  Temp(Src) 99.8 F (37.7 C) (Oral)  Wt 213 lb 6.4 oz (96.798 kg)  SpO2 99% General appearance: alert, cooperative, appears stated age and mild distress Ears: normal TM's and external ear canals both ears Nose: Nares normal. Septum midline. Mucosa normal. No drainage or sinus tenderness., green discharge, moderate congestion, turbinates red, swollen, sinus tenderness bilateral Throat: lips, mucosa, and tongue normal; teeth and gums normal Neck: moderate anterior cervical adenopathy, supple, symmetrical, trachea midline and thyroid not enlarged, symmetric, no tenderness/mass/nodules Lungs: clear to auscultation bilaterally Heart: S1, S2 normal    Assessment:    Acute bacterial sinusitis.    Plan:    Nasal steroids per medication orders. Antihistamines per medication orders. Augmentin per medication orders.

## 2015-08-09 NOTE — Patient Instructions (Signed)

## 2015-08-09 NOTE — Progress Notes (Signed)
Pre visit review using our clinic review tool, if applicable. No additional management support is needed unless otherwise documented below in the visit note. 

## 2015-08-13 ENCOUNTER — Telehealth: Payer: Self-pay | Admitting: Family Medicine

## 2015-08-13 DIAGNOSIS — J209 Acute bronchitis, unspecified: Secondary | ICD-10-CM

## 2015-08-13 MED ORDER — PROMETHAZINE-DM 6.25-15 MG/5ML PO SYRP: 5.0000 mL | ORAL_SOLUTION | Freq: Four times a day (QID) | ORAL | Status: DC | PRN

## 2015-08-13 MED FILL — PROMETHAZINE-DM SYRUP: 6.25-15 | 6 days supply | Qty: 118 | Fill #0

## 2015-08-13 NOTE — Telephone Encounter (Signed)
Rx faxed and the patient has agreed to follow up if the cough does not improve.     KP

## 2015-08-13 NOTE — Telephone Encounter (Signed)
Refill x1=-- will need ov if no better 

## 2015-08-13 NOTE — Telephone Encounter (Signed)
Relation to [id number] Call back [phone] Pharmacy: [phone]  Reason for call:  Patient requesting a refill promethazine-dextromethorphan (PROMETHAZINE-DM) 6.25-15 MG/5ML syrup due to cough not improving

## 2015-08-13 NOTE — Telephone Encounter (Signed)
Seen 08/09/15 for URI. Please advise     KP

## 2015-08-17 ENCOUNTER — Telehealth: Payer: Self-pay | Admitting: Internal Medicine

## 2015-08-17 NOTE — Telephone Encounter (Signed)
Patient reports that he was on antibiotics for a sinus infection.  He is advised that if he has diarrhea he can try imodium.  He will call back for worsening symptoms

## 2015-09-02 ENCOUNTER — Other Ambulatory Visit: Payer: Self-pay | Admitting: Family Medicine

## 2015-09-02 NOTE — Telephone Encounter (Signed)
I will approve Livalo for 30 d with 2 rf or 90 day supply whichever he requests but he needs an appt for labs and follow up before that runs out.

## 2015-09-03 ENCOUNTER — Other Ambulatory Visit: Payer: Self-pay | Admitting: Family Medicine

## 2015-09-04 MED FILL — IBUPROFEN 400 MG TABLET: 400 | 7 days supply | Qty: 30 | Fill #2

## 2015-09-20 ENCOUNTER — Ambulatory Visit (HOSPITAL_BASED_OUTPATIENT_CLINIC_OR_DEPARTMENT_OTHER)
Admission: RE | Admit: 2015-09-20 | Discharge: 2015-09-20 | Disposition: A | Discharge status: Home / Self Care | Payer: Medicare Other | Source: Ambulatory Visit | Attending: Medical | Admitting: Medical

## 2015-09-20 ENCOUNTER — Telehealth: Payer: Self-pay | Admitting: Medical

## 2015-09-20 ENCOUNTER — Ambulatory Visit (INDEPENDENT_AMBULATORY_CARE_PROVIDER_SITE_OTHER): Payer: Medicare Other | Admitting: Medical

## 2015-09-20 ENCOUNTER — Encounter: Payer: Self-pay | Admitting: Medical

## 2015-09-20 DIAGNOSIS — M25561 Pain in right knee: Secondary | ICD-10-CM | POA: Insufficient documentation

## 2015-09-20 DIAGNOSIS — R0781 Pleurodynia: Secondary | ICD-10-CM | POA: Diagnosis not present

## 2015-09-20 DIAGNOSIS — L989 Disorder of the skin and subcutaneous tissue, unspecified: Secondary | ICD-10-CM

## 2015-09-20 DIAGNOSIS — F411 Generalized anxiety disorder: Secondary | ICD-10-CM | POA: Diagnosis not present

## 2015-09-20 DIAGNOSIS — D179 Benign lipomatous neoplasm, unspecified: Secondary | ICD-10-CM

## 2015-09-20 DIAGNOSIS — M1711 Unilateral primary osteoarthritis, right knee: Secondary | ICD-10-CM | POA: Insufficient documentation

## 2015-09-20 MED ORDER — DICLOFENAC SODIUM 75 MG PO TBEC: 75.0000 mg | DELAYED_RELEASE_TABLET | Freq: Two times a day (BID) | ORAL | Status: DC

## 2015-09-20 MED ORDER — ALPRAZOLAM 0.5 MG PO TABS: 0.5000 mg | ORAL_TABLET | Freq: Two times a day (BID) | ORAL | Status: DC | PRN

## 2015-09-20 MED ORDER — TRAMADOL HCL 50 MG PO TABS: 50.0000 mg | ORAL_TABLET | Freq: Four times a day (QID) | ORAL | Status: DC | PRN

## 2015-09-20 MED FILL — DICLOFENAC SOD EC 75 MG TAB: 75 | 15 days supply | Qty: 30 | Fill #0

## 2015-09-20 MED FILL — traMADol HCL 50 MG TABS: 50 | 4 days supply | Qty: 16 | Fill #0

## 2015-09-20 MED FILL — ALPRAZolam 0.5 MG TABS: 0.5 | 4 days supply | Qty: 8 | Fill #0

## 2015-09-20 NOTE — Progress Notes (Signed)
Subjective:    Patient ID: Stevyn Madden, male    DOB: April 25, 1946, 70 y.o.   MRN: [id number]  HPI   Pt in with rt knee pain. Hx of meniscus repair in the past. Past 6-8 months pain in knee is getting worse. Pt states getting in way of his daily activities. If tries to go to gym or golf pain is increased. Pt is taking ibuprofen for the pain. He thinks 400 mg dose. But not helping adequatley.   Paint level 5/10 today. 8/10 yesterday.  Pt had surgery in past [facility]. Surgery more than 5 years.  Also at end he mentioned 6 months of posterior left lower rib region pain. He feels like may be a lump in the area. Pain comes and goes in past. Last 2 months mild and almost daily.   Review of Systems  Cardiovascular: Negative for chest pain and palpitations.  Gastrointestinal: Negative for abdominal pain.  Musculoskeletal:       Rt knee pain.  Lt posterior rib region pain.  Psychiatric/Behavioral: Negative for suicidal ideas and sleep disturbance. The patient is nervous/anxious.    Past Medical History  Diagnosis Date  . Vitamin D deficiency   . Back pain 10/10/2012  . Arthritis   . Hyperlipidemia   . Hypertension   . Obesity (BMI 30.0-34.9)   . Osteoporosis   . Anemia   . Nocturia 08/21/2013  . Hyperglycemia 08/21/2013  . Unspecified constipation 02/06/2014  . 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 Rectal bleeding 07/30/2014  . Anemia 08/14/2014  . Anxiety   . Hemorrhoids, internal, with bleeding 2005    internal and external. 2005 band ligation (dr Ferdinand Lango in [facility])  . Fatty liver 2005    seen on 2005 ultrasound,05/2015 CT.     Social History   Social History  . Marital Status: Married    Spouse Name: N/A  . Number of Children: 1  . Years of Education: N/A   Occupational History  . Retired [employer]   Social History Main Topics  . Smoking status: Never Smoker   . Smokeless tobacco: Never Used  . Alcohol Use: 1.2 oz/week   2 Standard drinks or equivalent per week     Comment: Occassionally  . Drug Use: No  . Sexual Activity: No   Other Topics Concern  . Not on file   Social History Narrative    Past Surgical History  Procedure Laterality Date  . Tonsillectomy  1957  . Fracture surgery Left 1989    leg  . Knee surgery Left 2010    arthroscopy, torn carilage  . Knee surgery Right 2012    arthroscopy  . Hemorrhoid banding  2005  . Colonoscopy  before 2005, 2012, 2013, 08/2014.     Family History  Problem Relation Age of Onset  . Stroke Mother   . Aneurysm Mother     brain  . Arthritis Father   . Liver cancer Father     liver, atomic testing in [employer]  . Gallbladder disease Father   . Hyperlipidemia Sister   . Hypertension Sister   . Obesity Sister   . Diabetes Sister   . Diabetes Brother   . Hyperlipidemia Sister   . Hypertension Sister   . Obesity Sister   . Diabetes Sister   . Aneurysm Brother     brain  . COPD Brother   . Colon cancer Neg Hx   . Colon polyps Neg Hx   .  Esophageal cancer Neg Hx   . Rectal cancer Neg Hx   . Stomach cancer Neg Hx   . Gallbladder disease Sister     x4    No Known Allergies  Current Outpatient Prescriptions on File Prior to Visit  Medication Sig Dispense Refill  . amLODipine-benazepril (LOTREL) 5-20 MG capsule TAKE 1 CAPSULE DAILY 90 capsule 0  . amoxicillin-clavulanate (AUGMENTIN) 875-125 MG tablet Take 1 tablet by mouth 2 (two) times daily. 20 tablet 0  . fluticasone (FLONASE) 50 MCG/ACT nasal spray Place 2 sprays into both nostrils daily. 16 g 6  . hydrocortisone (ANUSOL-HC) 25 MG suppository Place 1 suppository (25 mg total) rectally 2 (two) times daily. 12 suppository 0  . lidocaine (LIDODERM) 5 % Place 1 patch onto the skin daily. Remove & Discard patch within 12 hours or as directed by MD 30 patch 0  . LIVALO 2 MG TABS TAKE 1 TABLET DAILY 90 tablet 0  . metoprolol (LOPRESSOR) 50 MG tablet TAKE 1 TABLET DAILY 90 tablet 0  .  promethazine-dextromethorphan (PROMETHAZINE-DM) 6.25-15 MG/5ML syrup Take 5 mLs by mouth 4 (four) times daily as needed. 118 mL 0  . simethicone (MYLICON) 80 MG chewable tablet Chew 1 tablet (80 mg total) by mouth 4 (four) times daily. 60 tablet 0   No current facility-administered medications on file prior to visit.    There were no vitals taken for this visit.       Objective:   Physical Exam  General- No acute distress. Pleasant patient. Lungs- Clear, even and unlabored. Heart- regular rate and rhythm. Neurologic- CNII- XII grossly intact.  Rt knee- medial aspect tenderness to palpation. No instability. No crepitus. Negative homans sign.  Lt lower rib area- mild tenderness to palpation. Pain is over small mole. But this area has many small moles. No redness, no warmth. Maybe small lipoma felt. If so is very small.     Assessment & Plan:  For your rt knee pain will rx diclofenac and tramadol for more severe pain. Stop ibuprofen. Will get xray of the knee and do plan to refer you to orthopedist after I review xray.  For lt rib area pain will get lt rib series. If xray negative then may refer you to surgeon to evaluate if lipoma in region. Or dermatologist to evaluate the overlying mole.  For anxiety. rx xanax low dose. For refills contact Dr. Charlett Blake.  Follow up in 7 days or as needed

## 2015-09-20 NOTE — Telephone Encounter (Signed)
referal to ortho placed.

## 2015-09-20 NOTE — Patient Instructions (Addendum)
For your rt knee pain will rx diclofenac and tramadol for more severe pain. Stop ibuprofen. Will get xray of the knee and do plan to refer you to orthopedist after I review xray.  For lt rib area pain will get lt rib series. If xray negative then may refer you to surgeon to evaluate if lipoma in region. Or dermatologist to evaluate the overlying mole.  For anxiety. rx xanax low dose. For refills contact Dr. Charlett Blake.  Follow up in 7 days or as needed

## 2015-09-20 NOTE — Telephone Encounter (Signed)
Referral to dermatologist made.

## 2015-09-25 ENCOUNTER — Emergency Department (INDEPENDENT_AMBULATORY_CARE_PROVIDER_SITE_OTHER)
Admission: EM | Admit: 2015-09-25 | Discharge: 2015-09-25 | Disposition: A | Discharge status: Home / Self Care | Payer: Medicare Other | Source: Home / Self Care | Attending: Family Medicine | Admitting: Family Medicine

## 2015-09-25 DIAGNOSIS — H04302 Unspecified dacryocystitis of left lacrimal passage: Secondary | ICD-10-CM

## 2015-09-25 MED ORDER — TOBRAMYCIN 0.3 % OP SOLN: OPHTHALMIC | Status: DC

## 2015-09-25 NOTE — ED Provider Notes (Signed)
CSN: [id number]     Arrival date & time 09/25/15  1858 History   First MD Initiated Contact with Patient 09/25/15 1940     Chief Complaint  Patient presents with  . Eye Pain      HPI Comments: Patient states that his left eye began "twitching" 3 days ago.  He has noted swelling at the medial aspect of his left eye, worse each morning, and he has had increased lacrimation.  No changes in vision.  Patient is a 70 y.o. male presenting with eye problem. The history is provided by the patient.  Eye Problem Location:  L eye Quality: twitching. Severity:  Mild Onset quality:  Sudden Duration:  3 days Timing:  Constant Progression:  Unchanged Chronicity:  New Context: not contact lens problem, not direct trauma, not foreign body and not scratch   Relieved by:  None tried Worsened by:  Nothing tried Ineffective treatments:  None tried Associated symptoms: crusting, decreased vision, discharge, inflammation, swelling and tearing   Associated symptoms: no blurred vision, no double vision, no facial rash, no foreign body sensation, no headaches, no itching, no photophobia, no redness and no scotomas     Past Medical History  Diagnosis Date  . Vitamin D deficiency   . Back pain 10/10/2012  . Arthritis   . Hyperlipidemia   . Hypertension   . Obesity (BMI 30.0-34.9)   . Osteoporosis   . Anemia   . Nocturia 08/21/2013  . Hyperglycemia 08/21/2013  . Unspecified constipation 02/06/2014  . 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 Rectal bleeding 07/30/2014  . Anemia 08/14/2014  . Anxiety   . Hemorrhoids, internal, with bleeding 2005    internal and external. 2005 band ligation (dr Ferdinand Lango in [facility])  . Fatty liver 2005    seen on 2005 ultrasound,05/2015 CT.    Past Surgical History  Procedure Laterality Date  . Tonsillectomy  1957  . Fracture surgery Left 1989    leg  . Knee surgery Left 2010    arthroscopy, torn carilage  . Knee surgery Right 2012    arthroscopy  .  Hemorrhoid banding  2005  . Colonoscopy  before 2005, 2012, 2013, 08/2014.    Family History  Problem Relation Age of Onset  . Stroke Mother   . Aneurysm Mother     brain  . Arthritis Father   . Liver cancer Father     liver, atomic testing in [employer]  . Gallbladder disease Father   . Hyperlipidemia Sister   . Hypertension Sister   . Obesity Sister   . Diabetes Sister   . Diabetes Brother   . Hyperlipidemia Sister   . Hypertension Sister   . Obesity Sister   . Diabetes Sister   . Aneurysm Brother     brain  . COPD Brother   . Colon cancer Neg Hx   . Colon polyps Neg Hx   . Esophageal cancer Neg Hx   . Rectal cancer Neg Hx   . Stomach cancer Neg Hx   . Gallbladder disease Sister     x4   Social History  Substance Use Topics  . Smoking status: Never Smoker   . Smokeless tobacco: Never Used  . Alcohol Use: 1.2 oz/week    2 Standard drinks or equivalent per week     Comment: Occassionally    Review of Systems  HENT: Negative for congestion.   Eyes: Positive for discharge. Negative for blurred vision, double vision, photophobia,  redness, itching and visual disturbance.  Neurological: Negative for headaches.  All other systems reviewed and are negative.   Allergies  Review of patient's allergies indicates no known allergies.  Home Medications   Prior to Admission medications   Medication Sig Start Date End Date Taking? Authorizing Provider  ALPRAZolam Duanne Moron) 0.5 MG tablet Take 1 tablet (0.5 mg total) by mouth 2 (two) times daily as needed for anxiety. 09/20/15   Edward Saguier, PA-C  amLODipine-benazepril (LOTREL) 5-20 MG capsule TAKE 1 CAPSULE DAILY 09/03/15   Mosie Lukes, MD  amoxicillin-clavulanate (AUGMENTIN) 875-125 MG tablet Take 1 tablet by mouth 2 (two) times daily. 08/09/15   Rosalita Chessman Chase, DO  diclofenac (VOLTAREN) 75 MG EC tablet Take 1 tablet (75 mg total) by mouth 2 (two) times daily. 09/20/15   Percell Miller Saguier, PA-C  fluticasone (FLONASE) 50  MCG/ACT nasal spray Place 2 sprays into both nostrils daily. 08/09/15   Rosalita Chessman Chase, DO  hydrocortisone (ANUSOL-HC) 25 MG suppository Place 1 suppository (25 mg total) rectally 2 (two) times daily. 07/05/15   Percell Miller Saguier, PA-C  lidocaine (LIDODERM) 5 % Place 1 patch onto the skin daily. Remove & Discard patch within 12 hours or as directed by MD 06/13/15   Annita Brod, MD  LIVALO 2 MG TABS TAKE 1 TABLET DAILY 09/03/15   Mosie Lukes, MD  metoprolol (LOPRESSOR) 50 MG tablet TAKE 1 TABLET DAILY 09/03/15   Mosie Lukes, MD  promethazine-dextromethorphan (PROMETHAZINE-DM) 6.25-15 MG/5ML syrup Take 5 mLs by mouth 4 (four) times daily as needed. 08/13/15   Rosalita Chessman Chase, DO  simethicone (MYLICON) 80 MG chewable tablet Chew 1 tablet (80 mg total) by mouth 4 (four) times daily. 07/05/15   Percell Miller Saguier, PA-C  tobramycin (TOBREX) 0.3 % ophthalmic solution Place 2 drops in left eye every 1 to 2 hours until improved, then 1 drop every 4 hours 09/25/15   Kandra Nicolas, MD  traMADol (ULTRAM) 50 MG tablet Take 1 tablet (50 mg total) by mouth every 6 (six) hours as needed. 09/20/15   Mackie Pai, PA-C   Meds Ordered and Administered this Visit  Medications - No data to display  BP 131/85 mmHg  Pulse 79  Temp(Src) 98.2 F (36.8 C) (Oral)  Ht 5' 10.5" (1.791 m)  Wt 213 lb 8 oz (96.843 kg)  BMI 30.19 kg/m2  SpO2 98% No data found.   Physical Exam  Constitutional: He appears well-developed and well-nourished. No distress.  HENT:  Head: Normocephalic.  Right Ear: Tympanic membrane normal.  Left Ear: Tympanic membrane normal.  Nose: Nose normal.  Mouth/Throat: Oropharynx is clear and moist.  Eyes: Conjunctivae, EOM and lids are normal. Pupils are equal, round, and reactive to light. Lids are everted and swept, no foreign bodies found. Left eye exhibits no chemosis, no discharge, no exudate and no hordeolum. No foreign body present in the left eye.    There is tenderness to  palpation and mild swelling/erythema at the left medial canthus over nasolacrimal duct.  No evidence preseptal cellulitis.  Nursing note and vitals reviewed.   ED Course  Procedures none   MDM   1. Dacrocystitis, left    Begin tobramycin ophthalmic suspension.  Begin applying warm compress every 2 to 3 hours until improved. Followup with ophthalmologist if not improved 3 days.   Kandra Nicolas, MD 10/01/15 [phone]

## 2015-09-25 NOTE — ED Notes (Signed)
Left eye started itching 3 days ago, Became inflamed, and watering.

## 2015-09-25 NOTE — Discharge Instructions (Signed)
Begin applying warm compress every 2 to 3 hours until improved.   Dacryocystitis Dacryocystitis is an infection of the tear sac (lacrimal sac). The lacrimal sac lies between the inner corner of the eyelids and the nose. The glands of the eyelids produce tears. This is to keep the surface of the eye wet and protect it. These tears drain from the surface of the eyes through a duct in each lid (lacrimal ducts), then through the lacrimal sac into the nose. The tears are then swallowed. If the lacrimal sacs become blocked, bacteria begin to buildup. The lacrimal sacs can become infected. Dacryocystitis may be sudden (acute) or long-lasting (chronic). This problem is most common in infants because the tear ducts are not fully developed and clog easily. In that case, infants may have episodes of tearing and infection. However, in most cases, the problem gets better as the infant grows. CAUSES  The cause is often unknown. Known causes can include:  Malformation of the lacrimal sac.  Injury to the eye.  Eye infection.  Injury or inflammation of the nasal passages. SYMPTOMS   Usually only one eye is involved.  Excessive tearing and watering from the involved eye.  Tenderness, redness, and swelling of the lower lid near the nose.  A sore, red, inflamed bump on the inner corner of the lower lid. DIAGNOSIS  A diagnosis is made after an eye exam to see how much blockage is present and if the surface of the eye is also infected. A culture of the fluid from the lacrimal sac may be examined to find if a specific infection is present. TREATMENT  Treatment depends on:   The person's age.  Whether or not the infection is chronic or acute.  The amount of blockage that is present. Additional treatment Sometimes massaging the area (starting from the inside of the eye and gently massaging down toward the nose) will improve the condition, combined with antibiotic eyedrops or ointments. If massaging the  area does not work, it may be necessary to probe the ducts and open up the drainage system. While this is easily done in the office in adults, probing usually has to be done under general anesthesia in infants.  If the blockage cannot be cleared by probing, surgery may be needed under general anesthesia to create a direct opening for tears to flow between the lacrimal sac and the inside of the nose (dacryocystorhinostomy, DCR). HOME CARE INSTRUCTIONS   Use any antibiotic eyedrops, ointment, or pills as directed by the caregiver. Finish all medicines even if the symptoms start to get better.  Massage the lacrimal sac as directed by the caregiver. SEEK IMMEDIATE MEDICAL CARE IF:   There is increased pain, swelling, redness, or drainage from the eye.  Muscle aches, chills, or a general sick feeling develop.  A fever or persistent symptoms develop for more than 2-3 days.  The fever and symptoms suddenly get worse. MAKE SURE YOU:   Understand these instructions.  Will watch your condition.  Will get help right away if you are not doing well or get worse.   This information is not intended to replace advice given to you by your health care provider. Make sure you discuss any questions you have with your health care provider.   Document Released: 06/13/2000 Document Revised: 07/07/2014 Document Reviewed: 11/17/2011 Elsevier Interactive Patient Education [employer].

## 2015-10-26 DIAGNOSIS — N401 Enlarged prostate with lower urinary tract symptoms: Secondary | ICD-10-CM | POA: Diagnosis not present

## 2015-10-26 DIAGNOSIS — Z0000 Encounter for general adult medical examination without abnormal findings: Secondary | ICD-10-CM | POA: Diagnosis not present

## 2015-10-26 DIAGNOSIS — N138 Other obstructive and reflux uropathy: Secondary | ICD-10-CM | POA: Diagnosis not present

## 2015-10-26 DIAGNOSIS — R351 Nocturia: Secondary | ICD-10-CM | POA: Diagnosis not present

## 2015-11-19 ENCOUNTER — Other Ambulatory Visit: Payer: Self-pay | Admitting: Family Medicine

## 2015-11-22 ENCOUNTER — Other Ambulatory Visit: Payer: Self-pay | Admitting: Family Medicine

## 2015-11-30 ENCOUNTER — Other Ambulatory Visit: Payer: Self-pay | Admitting: Family Medicine

## 2015-12-22 ENCOUNTER — Encounter (HOSPITAL_BASED_OUTPATIENT_CLINIC_OR_DEPARTMENT_OTHER): Payer: Self-pay | Admitting: Emergency Medicine

## 2015-12-22 ENCOUNTER — Emergency Department (HOSPITAL_BASED_OUTPATIENT_CLINIC_OR_DEPARTMENT_OTHER): Payer: Medicare Other

## 2015-12-22 ENCOUNTER — Emergency Department (HOSPITAL_BASED_OUTPATIENT_CLINIC_OR_DEPARTMENT_OTHER)
Admission: EM | Admit: 2015-12-22 | Discharge: 2015-12-22 | Disposition: A | Discharge status: Home / Self Care | Payer: Medicare Other | Attending: Emergency Medicine | Admitting: Emergency Medicine

## 2015-12-22 DIAGNOSIS — M1990 Unspecified osteoarthritis, unspecified site: Secondary | ICD-10-CM | POA: Diagnosis not present

## 2015-12-22 DIAGNOSIS — B9789 Other viral agents as the cause of diseases classified elsewhere: Secondary | ICD-10-CM

## 2015-12-22 DIAGNOSIS — J069 Acute upper respiratory infection, unspecified: Secondary | ICD-10-CM | POA: Diagnosis not present

## 2015-12-22 DIAGNOSIS — Z79899 Other long term (current) drug therapy: Secondary | ICD-10-CM | POA: Insufficient documentation

## 2015-12-22 DIAGNOSIS — E669 Obesity, unspecified: Secondary | ICD-10-CM | POA: Diagnosis not present

## 2015-12-22 DIAGNOSIS — R05 Cough: Secondary | ICD-10-CM | POA: Diagnosis not present

## 2015-12-22 DIAGNOSIS — E785 Hyperlipidemia, unspecified: Secondary | ICD-10-CM | POA: Diagnosis not present

## 2015-12-22 DIAGNOSIS — Z6830 Body mass index (BMI) 30.0-30.9, adult: Secondary | ICD-10-CM | POA: Diagnosis not present

## 2015-12-22 DIAGNOSIS — I10 Essential (primary) hypertension: Secondary | ICD-10-CM | POA: Diagnosis not present

## 2015-12-22 MED ORDER — PROMETHAZINE-DM 6.25-15 MG/5ML PO SYRP: 5.0000 mL | ORAL_SOLUTION | Freq: Four times a day (QID) | ORAL | Status: DC | PRN

## 2015-12-22 MED ORDER — DOXYCYCLINE HYCLATE 100 MG PO CAPS: 100.0000 mg | ORAL_CAPSULE | Freq: Two times a day (BID) | ORAL | Status: DC

## 2015-12-22 MED ORDER — GUAIFENESIN ER 1200 MG PO TB12: 1.0000 | ORAL_TABLET | Freq: Two times a day (BID) | ORAL | Status: DC

## 2015-12-22 MED ORDER — PREDNISONE 50 MG PO TABS: 50.0000 mg | ORAL_TABLET | Freq: Every day | ORAL | Status: DC

## 2015-12-22 NOTE — Discharge Instructions (Signed)
Return here as needed.  Follow-up with your primary care Dr. increase your fluid intake, rest as much as possible °

## 2015-12-22 NOTE — ED Provider Notes (Signed)
CSN: [id number]     Arrival date & time 12/22/15  1954 History   First MD Initiated Contact with Patient 12/22/15 2221     Chief Complaint  Patient presents with  . Cough     (Consider location/radiation/quality/duration/timing/severity/associated sxs/prior Treatment) HPI Patient presents to the emergency department with a cough and nasal congestion for the last 2-3 weeks.  Patient states that he has had multiple over-the-counter medications without relief of his symptoms.  Patient states the numbness is made better or worse.  States that his symptoms are worse at night. The patient denies chest pain, shortness of breath, headache,blurred vision, neck pain, fever, weakness, numbness, dizziness, anorexia, edema, abdominal pain, nausea, vomiting, diarrhea, rash, back pain, dysuria, hematemesis, bloody stool, near syncope, or syncope. Past Medical History  Diagnosis Date  . Vitamin D deficiency   . Back pain 10/10/2012  . Arthritis   . Hyperlipidemia   . Hypertension   . Obesity (BMI 30.0-34.9)   . Osteoporosis   . Anemia   . Nocturia 08/21/2013  . Hyperglycemia 08/21/2013  . Unspecified constipation 02/06/2014  . 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 Rectal bleeding 07/30/2014  . Anemia 08/14/2014  . Anxiety   . Hemorrhoids, internal, with bleeding 2005    internal and external. 2005 band ligation (dr Ferdinand Lango in [facility])  . Fatty liver 2005    seen on 2005 ultrasound,05/2015 CT.    Past Surgical History  Procedure Laterality Date  . Tonsillectomy  1957  . Fracture surgery Left 1989    leg  . Knee surgery Left 2010    arthroscopy, torn carilage  . Knee surgery Right 2012    arthroscopy  . Hemorrhoid banding  2005  . Colonoscopy  before 2005, 2012, 2013, 08/2014.    Family History  Problem Relation Age of Onset  . Stroke Mother   . Aneurysm Mother     brain  . Arthritis Father   . Liver cancer Father     liver, atomic testing in [employer]  . Gallbladder  disease Father   . Hyperlipidemia Sister   . Hypertension Sister   . Obesity Sister   . Diabetes Sister   . Diabetes Brother   . Hyperlipidemia Sister   . Hypertension Sister   . Obesity Sister   . Diabetes Sister   . Aneurysm Brother     brain  . COPD Brother   . Colon cancer Neg Hx   . Colon polyps Neg Hx   . Esophageal cancer Neg Hx   . Rectal cancer Neg Hx   . Stomach cancer Neg Hx   . Gallbladder disease Sister     x4   Social History  Substance Use Topics  . Smoking status: Never Smoker   . Smokeless tobacco: Never Used  . Alcohol Use: 1.2 oz/week    2 Standard drinks or equivalent per week     Comment: Occassionally    Review of Systems All other systems negative except as documented in the HPI. All pertinent positives and negatives as reviewed in the HPI.    Allergies  Review of patient's allergies indicates no known allergies.  Home Medications   Prior to Admission medications   Medication Sig Start Date End Date Taking? Authorizing Provider  ALPRAZolam Duanne Moron) 0.5 MG tablet Take 1 tablet (0.5 mg total) by mouth 2 (two) times daily as needed for anxiety. 09/20/15   Edward Saguier, PA-C  amLODipine-benazepril (LOTREL) 5-20 MG capsule TAKE 1 CAPSULE  DAILY 09/03/15   Mosie Lukes, MD  amoxicillin-clavulanate (AUGMENTIN) 875-125 MG tablet Take 1 tablet by mouth 2 (two) times daily. 08/09/15   Rosalita Chessman Chase, DO  diclofenac (VOLTAREN) 75 MG EC tablet Take 1 tablet (75 mg total) by mouth 2 (two) times daily. 09/20/15   Percell Miller Saguier, PA-C  fluticasone (FLONASE) 50 MCG/ACT nasal spray Place 2 sprays into both nostrils daily. 08/09/15   Rosalita Chessman Chase, DO  hydrocortisone (ANUSOL-HC) 25 MG suppository Place 1 suppository (25 mg total) rectally 2 (two) times daily. 07/05/15   Percell Miller Saguier, PA-C  lidocaine (LIDODERM) 5 % Place 1 patch onto the skin daily. Remove & Discard patch within 12 hours or as directed by MD 06/13/15   Annita Brod, MD  LIVALO 2 MG  TABS TAKE 1 TABLET DAILY 09/03/15   Mosie Lukes, MD  metoprolol (LOPRESSOR) 50 MG tablet TAKE 1 TABLET DAILY 09/03/15   Mosie Lukes, MD  promethazine-dextromethorphan (PROMETHAZINE-DM) 6.25-15 MG/5ML syrup Take 5 mLs by mouth 4 (four) times daily as needed. 08/13/15   Rosalita Chessman Chase, DO  simethicone (MYLICON) 80 MG chewable tablet Chew 1 tablet (80 mg total) by mouth 4 (four) times daily. 07/05/15   Percell Miller Saguier, PA-C  tobramycin (TOBREX) 0.3 % ophthalmic solution Place 2 drops in left eye every 1 to 2 hours until improved, then 1 drop every 4 hours 09/25/15   Kandra Nicolas, MD  traMADol (ULTRAM) 50 MG tablet Take 1 tablet (50 mg total) by mouth every 6 (six) hours as needed. 09/20/15   Edward Saguier, PA-C   BP 137/80 mmHg  Pulse 82  Temp(Src) 98.4 F (36.9 C) (Oral)  Resp 18  Ht 5\' 10"  (1.778 m)  Wt 96.163 kg  BMI 30.42 kg/m2  SpO2 100% Physical Exam  Constitutional: He is oriented to person, place, and time. He appears well-developed and well-nourished. No distress.  HENT:  Head: Normocephalic and atraumatic.  Mouth/Throat: Oropharynx is clear and moist.  Eyes: Pupils are equal, round, and reactive to light.  Neck: Normal range of motion. Neck supple.  Cardiovascular: Normal rate, regular rhythm and normal heart sounds.  Exam reveals no gallop and no friction rub.   No murmur heard. Pulmonary/Chest: Effort normal and breath sounds normal. No respiratory distress. He has no wheezes.  Neurological: He is alert and oriented to person, place, and time. He exhibits normal muscle tone. Coordination normal.  Skin: Skin is warm and dry. No rash noted. No erythema.  Psychiatric: He has a normal mood and affect. His behavior is normal.  Nursing note and vitals reviewed.   ED Course  Procedures (including critical care time) Labs Review Labs Reviewed - No data to display  Imaging Review Dg Chest 2 View  12/22/2015  CLINICAL DATA:  70 year old male with a history of productive  cough EXAM: CHEST  2 VIEW COMPARISON:  09/20/2015 FINDINGS: Cardiomediastinal silhouette within normal limits. No evidence of central vascular congestion. No interlobular septal thickening. No confluent airspace disease. No pneumothorax. No pleural effusion. No displaced fracture. IMPRESSION: No radiographic evidence of acute cardiopulmonary disease. Signed, Dulcy Fanny. Earleen Newport, DO [facility] Electronically Signed   By: Corrie Mckusick D.O.   On: 12/22/2015 20:42   I have personally reviewed and evaluated these images and lab results as part of my medical decision-making.   EKG Interpretation None       The patient will be treated for upper respiratory infection.  He also has a small area on the posterior right neck swollen and tender, nonfluctuant.  Patient is advised follow-up with his primary care Dr. told to return here as needed.  Patient agrees the plan.  All questions were answered  Dalia Heading, PA-C 12/26/15 0127  Ezequiel Essex, MD 12/26/15 [phone]

## 2015-12-22 NOTE — ED Notes (Signed)
Pt states he has had a productive cough for 1 week. Also c/o sinus pressure and swollen gland.

## 2015-12-22 NOTE — ED Notes (Signed)
Patient states that he has had a cough and aches x 2 -3 weeks. Reports that he also has some chest and nasal congestion with a headache and draingage

## 2015-12-22 NOTE — ED Notes (Signed)
Pt given d/c instructions. Rx x 4. Verbalizes understanding. No questions.

## 2015-12-31 ENCOUNTER — Ambulatory Visit (INDEPENDENT_AMBULATORY_CARE_PROVIDER_SITE_OTHER): Payer: Medicare Other | Admitting: Family

## 2015-12-31 ENCOUNTER — Encounter: Payer: Self-pay | Admitting: Family

## 2015-12-31 VITALS — BP 130/80 | HR 81 | Temp 98.0°F | Resp 18 | Ht 71.0 in | Wt 215.4 lb

## 2015-12-31 DIAGNOSIS — L0211 Cutaneous abscess of neck: Secondary | ICD-10-CM

## 2015-12-31 NOTE — Progress Notes (Addendum)
Subjective:    Patient ID: [name], male    DOB: 1945/09/14, 70 y.o.   MRN: [id number]  HPI  [name] is a 70 yr old male who presents today with chief complaint of cyst. Cyst is located on the right side of his neck and has been present x 6 months.  Notes that for the last few days the cyst has been draining.  Reports that the area swelled up last week and got sore to the touch.  Notes drainage is bloody/pus filled.  He started doxycycline on 6/24 for a URI and continues this medication. Review of Systems See HPI  Past Medical History  Diagnosis Date  . Vitamin D deficiency   . Back pain 10/10/2012  . Arthritis   . Hyperlipidemia   . Hypertension   . Obesity (BMI 30.0-34.9)   . Osteoporosis   . Anemia   . Nocturia 08/21/2013  . Hyperglycemia 08/21/2013  . Unspecified constipation 02/06/2014  . 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 Rectal bleeding 07/30/2014  . Anemia 08/14/2014  . Anxiety   . Hemorrhoids, internal, with bleeding 2005    internal and external. 2005 band ligation (dr Ferdinand Lango in [facility])  . Fatty liver 2005    seen on 2005 ultrasound,05/2015 CT.      Social History   Social History  . Marital Status: Married    Spouse Name: N/A  . Number of Children: 1  . Years of Education: N/A   Occupational History  . Retired [employer]   Social History Main Topics  . Smoking status: Never Smoker   . Smokeless tobacco: Never Used  . Alcohol Use: 1.2 oz/week    2 Standard drinks or equivalent per week     Comment: Occassionally  . Drug Use: No  . Sexual Activity: No   Other Topics Concern  . Not on file   Social History Narrative    Past Surgical History  Procedure Laterality Date  . Tonsillectomy  1957  . Fracture surgery Left 1989    leg  . Knee surgery Left 2010    arthroscopy, torn carilage  . Knee surgery Right 2012    arthroscopy  . Hemorrhoid banding  2005  . Colonoscopy  before 2005, 2012, 2013, 08/2014.      Family History  Problem Relation Age of Onset  . Stroke Mother   . Aneurysm Mother     brain  . Arthritis Father   . Liver cancer Father     liver, atomic testing in [employer]  . Gallbladder disease Father   . Hyperlipidemia Sister   . Hypertension Sister   . Obesity Sister   . Diabetes Sister   . Diabetes Brother   . Hyperlipidemia Sister   . Hypertension Sister   . Obesity Sister   . Diabetes Sister   . Aneurysm Brother     brain  . COPD Brother   . Colon cancer Neg Hx   . Colon polyps Neg Hx   . Esophageal cancer Neg Hx   . Rectal cancer Neg Hx   . Stomach cancer Neg Hx   . Gallbladder disease Sister     x4    No Known Allergies  Current Outpatient Prescriptions on File Prior to Visit  Medication Sig Dispense Refill  . ALPRAZolam (XANAX) 0.5 MG tablet Take 1 tablet (0.5 mg total) by mouth 2 (two) times daily as needed for anxiety. 8 tablet 0  . amLODipine-benazepril (LOTREL)  5-20 MG capsule TAKE 1 CAPSULE DAILY 90 capsule 0  . doxycycline (VIBRAMYCIN) 100 MG capsule Take 1 capsule (100 mg total) by mouth 2 (two) times daily. 20 capsule 0  . fluticasone (FLONASE) 50 MCG/ACT nasal spray Place 2 sprays into both nostrils daily. 16 g 6  . Guaifenesin 1200 MG TB12 Take 1 tablet (1,200 mg total) by mouth 2 (two) times daily. 20 each 0  . hydrocortisone (ANUSOL-HC) 25 MG suppository Place 1 suppository (25 mg total) rectally 2 (two) times daily. 12 suppository 0  . LIVALO 2 MG TABS TAKE 1 TABLET DAILY 90 tablet 0  . metoprolol (LOPRESSOR) 50 MG tablet TAKE 1 TABLET DAILY 90 tablet 0  . promethazine-dextromethorphan (PROMETHAZINE-DM) 6.25-15 MG/5ML syrup Take 5 mLs by mouth 4 (four) times daily as needed for cough. 120 mL 0  . amoxicillin-clavulanate (AUGMENTIN) 875-125 MG tablet Take 1 tablet by mouth 2 (two) times daily. (Patient not taking: Reported on 12/31/2015) 20 tablet 0  . diclofenac (VOLTAREN) 75 MG EC tablet Take 1 tablet (75 mg total) by mouth 2 (two) times  daily. (Patient not taking: Reported on 12/31/2015) 30 tablet 0  . lidocaine (LIDODERM) 5 % Place 1 patch onto the skin daily. Remove & Discard patch within 12 hours or as directed by MD (Patient not taking: Reported on 12/31/2015) 30 patch 0  . predniSONE (DELTASONE) 50 MG tablet Take 1 tablet (50 mg total) by mouth daily. (Patient not taking: Reported on 12/31/2015) 5 tablet 0  . simethicone (MYLICON) 80 MG chewable tablet Chew 1 tablet (80 mg total) by mouth 4 (four) times daily. (Patient not taking: Reported on 12/31/2015) 60 tablet 0  . tobramycin (TOBREX) 0.3 % ophthalmic solution Place 2 drops in left eye every 1 to 2 hours until improved, then 1 drop every 4 hours (Patient not taking: Reported on 12/31/2015) 5 mL 0  . traMADol (ULTRAM) 50 MG tablet Take 1 tablet (50 mg total) by mouth every 6 (six) hours as needed. (Patient not taking: Reported on 12/31/2015) 16 tablet 0   No current facility-administered medications on file prior to visit.    BP 130/80 mmHg  Pulse 81  Temp(Src) 98 F (36.7 C) (Oral)  Resp 18  Ht 5\' 11"  (1.803 m)  Wt 215 lb 6.4 oz (97.705 kg)  BMI 30.06 kg/m2  SpO2 98%       Objective:   Physical Exam  Constitutional: He is oriented to person, place, and time. He appears well-developed and well-nourished.  Neck:    Approximately 1 inch diameter fluctuant boil noted right posterior neck.    Neurological: He is alert and oriented to person, place, and time.  Skin: Skin is warm and dry.  Psychiatric: He has a normal mood and affect. His behavior is normal. Judgment and thought content normal.          Assessment & Plan:  Neck Abscess-  Procedure including risks/benefits explained to patient.  Questions were answered. After informed consent was obtained and a time out completed, site was cleansed with betadine and then alcohol. 1% Lidocaine with epinephrine was injected into the boil and surrounding skin and incision and drainage was performed.  Some pruritic material  was expressed.  I was not able to express as much drainage as I expected. I think that the cyst is multiloculated.  I recommended that the patient continue doxycycline bid and will work to arrange an ENT referral on Wednesday after th holiday.  Pt tolerated procedure well.  Pt  instructed to contact us if he develops redness, drainage or swelling at the site.  Pt may use tylenol as needed for discomfort today.   Debbrah Alar NP

## 2015-12-31 NOTE — Patient Instructions (Addendum)
Please keep the area clean.  It is ok to shower. Continue doxycycline. Call if increased pain, redness, swelling or if you develop fever.  Follow up on Friday with Inda Castle or Charlett Blake.

## 2015-12-31 NOTE — Progress Notes (Signed)
Pre visit review using our clinic review tool, if applicable. No additional management support is needed unless otherwise documented below in the visit note. 

## 2016-01-03 ENCOUNTER — Telehealth: Payer: Self-pay | Admitting: Family

## 2016-01-03 DIAGNOSIS — H6123 Impacted cerumen, bilateral: Secondary | ICD-10-CM | POA: Diagnosis not present

## 2016-01-03 DIAGNOSIS — L989 Disorder of the skin and subcutaneous tissue, unspecified: Secondary | ICD-10-CM | POA: Diagnosis not present

## 2016-01-03 MED ORDER — DOXYCYCLINE HYCLATE 100 MG PO TABS: 100.0000 mg | ORAL_TABLET | Freq: Two times a day (BID) | ORAL | Status: DC

## 2016-01-03 MED FILL — DOXYCYCLINE 100 MG TABLET: 100 | 7 days supply | Qty: 14 | Fill #0

## 2016-01-03 NOTE — Telephone Encounter (Signed)
Contacted pt to follow up.  He stated that he saw ENT today. They plan excision of cyst next Friday. He is just about out of doxy, they did not provide refills. Advised pt I would send rx for doxy to cover him until his follow up with ENT next week. Pt verbalizes understanding.

## 2016-01-10 DIAGNOSIS — R221 Localized swelling, mass and lump, neck: Secondary | ICD-10-CM | POA: Diagnosis not present

## 2016-01-10 DIAGNOSIS — L988 Other specified disorders of the skin and subcutaneous tissue: Secondary | ICD-10-CM | POA: Diagnosis not present

## 2016-02-01 ENCOUNTER — Other Ambulatory Visit: Payer: Self-pay | Admitting: *Deleted

## 2016-02-01 MED ORDER — PITAVASTATIN CALCIUM 2 MG PO TABS: 1.0000 | ORAL_TABLET | Freq: Every day | ORAL | 0 refills | Status: DC

## 2016-02-01 MED FILL — LIVALO 2 MG TABLET: 2 | 30 days supply | Qty: 30 | Fill #0

## 2016-02-01 NOTE — Telephone Encounter (Signed)
Rx sent to the pharmacy by e-script.   Pt is scheduled an appt for (02/19/16).//AB/CMA

## 2016-02-19 ENCOUNTER — Ambulatory Visit (INDEPENDENT_AMBULATORY_CARE_PROVIDER_SITE_OTHER): Payer: Medicare Other | Admitting: Family Medicine

## 2016-02-19 ENCOUNTER — Encounter: Payer: Self-pay | Admitting: Family Medicine

## 2016-02-19 VITALS — BP 118/78 | HR 98 | Temp 98.1°F | Resp 16 | Ht 73.0 in | Wt 216.6 lb

## 2016-02-19 DIAGNOSIS — E876 Hypokalemia: Secondary | ICD-10-CM | POA: Diagnosis not present

## 2016-02-19 DIAGNOSIS — M62838 Other muscle spasm: Secondary | ICD-10-CM

## 2016-02-19 DIAGNOSIS — Z0000 Encounter for general adult medical examination without abnormal findings: Secondary | ICD-10-CM | POA: Diagnosis not present

## 2016-02-19 DIAGNOSIS — M898X1 Other specified disorders of bone, shoulder: Secondary | ICD-10-CM | POA: Diagnosis not present

## 2016-02-19 DIAGNOSIS — E669 Obesity, unspecified: Secondary | ICD-10-CM | POA: Diagnosis not present

## 2016-02-19 DIAGNOSIS — I10 Essential (primary) hypertension: Secondary | ICD-10-CM | POA: Diagnosis not present

## 2016-02-19 DIAGNOSIS — Z23 Encounter for immunization: Secondary | ICD-10-CM | POA: Diagnosis not present

## 2016-02-19 DIAGNOSIS — E785 Hyperlipidemia, unspecified: Secondary | ICD-10-CM

## 2016-02-19 DIAGNOSIS — D649 Anemia, unspecified: Secondary | ICD-10-CM

## 2016-02-19 DIAGNOSIS — M25511 Pain in right shoulder: Secondary | ICD-10-CM

## 2016-02-19 MED ORDER — PITAVASTATIN CALCIUM 2 MG PO TABS: 1.0000 | ORAL_TABLET | Freq: Every day | ORAL | 1 refills | Status: DC

## 2016-02-19 MED ORDER — METOPROLOL TARTRATE 50 MG PO TABS: 50.0000 mg | ORAL_TABLET | Freq: Every day | ORAL | 2 refills | Status: DC

## 2016-02-19 MED ORDER — AMLODIPINE BESY-BENAZEPRIL HCL 5-20 MG PO CAPS: 1.0000 | ORAL_CAPSULE | Freq: Every day | ORAL | 2 refills | Status: DC

## 2016-02-19 MED ORDER — AZELASTINE HCL 0.05 % OP SOLN: 1.0000 [drp] | Freq: Two times a day (BID) | OPHTHALMIC | 1 refills | Status: DC

## 2016-02-19 MED ORDER — IBUPROFEN 400 MG PO TABS: 400.0000 mg | ORAL_TABLET | Freq: Three times a day (TID) | ORAL | 3 refills | Status: DC | PRN

## 2016-02-19 MED ORDER — PITAVASTATIN CALCIUM 2 MG PO TABS: 1.0000 | ORAL_TABLET | Freq: Every day | ORAL | 2 refills | Status: DC

## 2016-02-19 MED FILL — IBUPROFEN 400 MG TABLET: 400 | 14 days supply | Qty: 40 | Fill #0

## 2016-02-19 MED FILL — AZELASTINE HCL 0.05% DROPS: 0.05 | 30 days supply | Qty: 6 | Fill #0

## 2016-02-19 NOTE — Patient Instructions (Addendum)
Melatonin 1-10 mg at bed  Encouraged good sleep hygiene such as dark, quiet room. No blue/green glowing lights such as computer screens in bedroom. No alcohol or stimulants in evening. Cut down on caffeine as able. Regular exercise is helpful but not just prior to bed time.  Hypertension Hypertension, commonly called high blood pressure, is when the force of blood pumping through your arteries is too strong. Your arteries are the blood vessels that carry blood from your heart throughout your body. A blood pressure reading consists of a higher number over a lower number, such as 110/72. The higher number (systolic) is the pressure inside your arteries when your heart pumps. The lower number (diastolic) is the pressure inside your arteries when your heart relaxes. Ideally you want your blood pressure below 120/80. Hypertension forces your heart to work harder to pump blood. Your arteries may become narrow or stiff. Having untreated or uncontrolled hypertension can cause heart attack, stroke, kidney disease, and other problems. RISK FACTORS Some risk factors for high blood pressure are controllable. Others are not.  Risk factors you cannot control include:   Race. You may be at higher risk if you are African American.  Age. Risk increases with age.  Gender. Men are at higher risk than women before age 32 years. After age 75, women are at higher risk than men. Risk factors you can control include:  Not getting enough exercise or physical activity.  Being overweight.  Getting too much fat, sugar, calories, or salt in your diet.  Drinking too much alcohol. SIGNS AND SYMPTOMS Hypertension does not usually cause signs or symptoms. Extremely high blood pressure (hypertensive crisis) may cause headache, anxiety, shortness of breath, and nosebleed. DIAGNOSIS To check if you have hypertension, your health care provider will measure your blood pressure while you are seated, with your arm held at the  level of your heart. It should be measured at least twice using the same arm. Certain conditions can cause a difference in blood pressure between your right and left arms. A blood pressure reading that is higher than normal on one occasion does not mean that you need treatment. If it is not clear whether you have high blood pressure, you may be asked to return on a different day to have your blood pressure checked again. Or, you may be asked to monitor your blood pressure at home for 1 or more weeks. TREATMENT Treating high blood pressure includes making lifestyle changes and possibly taking medicine. Living a healthy lifestyle can help lower high blood pressure. You may need to change some of your habits. Lifestyle changes may include:  Following the DASH diet. This diet is high in fruits, vegetables, and whole grains. It is low in salt, red meat, and added sugars.  Keep your sodium intake below 2,300 mg per day.  Getting at least 30-45 minutes of aerobic exercise at least 4 times per week.  Losing weight if necessary.  Not smoking.  Limiting alcoholic beverages.  Learning ways to reduce stress. Your health care provider may prescribe medicine if lifestyle changes are not enough to get your blood pressure under control, and if one of the following is true:  You are 18-45 years of age and your systolic blood pressure is above 140.  You are 28 years of age or older, and your systolic blood pressure is above 150.  Your diastolic blood pressure is above 90.  You have diabetes, and your systolic blood pressure is over [id number] or your diastolic  blood pressure is over 90.  You have kidney disease and your blood pressure is above 140/90.  You have heart disease and your blood pressure is above 140/90. Your personal target blood pressure may vary depending on your medical conditions, your age, and other factors. HOME CARE INSTRUCTIONS  Have your blood pressure rechecked as directed by your  health care provider.   Take medicines only as directed by your health care provider. Follow the directions carefully. Blood pressure medicines must be taken as prescribed. The medicine does not work as well when you skip doses. Skipping doses also puts you at risk for problems.  Do not smoke.   Monitor your blood pressure at home as directed by your health care provider. SEEK MEDICAL CARE IF:   You think you are having a reaction to medicines taken.  You have recurrent headaches or feel dizzy.  You have swelling in your ankles.  You have trouble with your vision. SEEK IMMEDIATE MEDICAL CARE IF:  You develop a severe headache or confusion.  You have unusual weakness, numbness, or feel faint.  You have severe chest or abdominal pain.  You vomit repeatedly.  You have trouble breathing. MAKE SURE YOU:   Understand these instructions.  Will watch your condition.  Will get help right away if you are not doing well or get worse.   This information is not intended to replace advice given to you by your health care provider. Make sure you discuss any questions you have with your health care provider.   Document Released: 06/16/2005 Document Revised: 10/31/2014 Document Reviewed: 04/08/2013 Elsevier Interactive Patient Education [employer].

## 2016-02-19 NOTE — Progress Notes (Signed)
Pre visit review using our clinic review tool, if applicable. No additional management support is needed unless otherwise documented below in the visit note. 

## 2016-02-20 LAB — CBC
HCT: 35.7 % — ABNORMAL LOW (ref 39.0–52.0)
Hemoglobin: 11.8 g/dL — ABNORMAL LOW (ref 13.0–17.0)
MCHC: 33 g/dL (ref 30.0–36.0)
MCV: 83.2 fl (ref 78.0–100.0)
Platelets: 194 10*3/uL (ref 150.0–400.0)
RBC: 4.29 Mil/uL (ref 4.22–5.81)
RDW: 13.7 % (ref 11.5–15.5)
WBC: 5.4 10*3/uL (ref 4.0–10.5)

## 2016-02-20 LAB — COMPREHENSIVE METABOLIC PANEL
ALT: 17 U/L (ref 0–53)
AST: 17 U/L (ref 0–37)
Albumin: 4.1 g/dL (ref 3.5–5.2)
Alkaline Phosphatase: 70 U/L (ref 39–117)
BUN: 14 mg/dL (ref 6–23)
CO2: 29 mEq/L (ref 19–32)
Calcium: 9.1 mg/dL (ref 8.4–10.5)
Chloride: 106 mEq/L (ref 96–112)
Creatinine, Ser: 1.28 mg/dL (ref 0.40–1.50)
GFR: 71.88 mL/min (ref >60.00)
Glucose, Bld: 90 mg/dL (ref 70–99)
Potassium: 4.3 mEq/L (ref 3.5–5.1)
Sodium: 139 mEq/L (ref 135–145)
Total Bilirubin: 0.3 mg/dL (ref 0.2–1.2)
Total Protein: 7 g/dL (ref 6.0–8.3)

## 2016-02-20 LAB — LIPID PANEL
Cholesterol: 145 mg/dL (ref 0–200)
HDL: 41.2 mg/dL (ref >39.00)
LDL Cholesterol: 66 mg/dL (ref 0–99)
NonHDL: 103.45
Total CHOL/HDL Ratio: 4
Triglycerides: 185 mg/dL — ABNORMAL HIGH (ref 0.0–149.0)
VLDL: 37 mg/dL (ref 0.0–40.0)

## 2016-02-20 LAB — MAGNESIUM: Magnesium: 1.9 mg/dL (ref 1.5–2.5)

## 2016-02-20 LAB — TSH: TSH: 1.46 u[IU]/mL (ref 0.35–4.50)

## 2016-02-20 LAB — HEPATITIS C ANTIBODY: HCV Ab: NEGATIVE

## 2016-02-20 MED ORDER — FERROUS FUMARATE 325 (106 FE) MG PO TABS: 1.0000 | ORAL_TABLET | Freq: Every day | ORAL | 3 refills | Status: DC

## 2016-02-20 MED FILL — FERROCITE TABLET: 324 | 30 days supply | Qty: 30 | Fill #0

## 2016-02-21 ENCOUNTER — Ambulatory Visit: Payer: Medicare Other | Admitting: Family Medicine

## 2016-02-21 ENCOUNTER — Encounter: Payer: Self-pay | Admitting: Family Medicine

## 2016-02-21 ENCOUNTER — Ambulatory Visit (INDEPENDENT_AMBULATORY_CARE_PROVIDER_SITE_OTHER): Payer: Medicare Other | Admitting: Family Medicine

## 2016-02-21 VITALS — BP 128/86 | HR 72 | Ht 71.0 in | Wt 215.0 lb

## 2016-02-21 DIAGNOSIS — M25511 Pain in right shoulder: Secondary | ICD-10-CM

## 2016-02-21 NOTE — Patient Instructions (Signed)
You have rotator cuff impingement Try to avoid painful activities (overhead activities, lifting with extended arm) as much as possible. Consider Aleve 2 tabs twice a day with food OR ibuprofen 3 tabs three times a day with food for pain and inflammation - use sparingly though because they can irritate the kidneys, stomach, increase blood pressure. Can take tylenol in addition to this. Subacromial injection may be beneficial to help with pain and to decrease inflammation - you were given this today. Consider physical therapy with transition to home exercise program. Do home exercise program with theraband and scapular stabilization exercises daily - these are very important for long term relief even if an injection was given.  3 sets of 10 once a day. If not improving at follow-up we will consider further imaging, physical therapy, and/or nitro patches. Follow up with me in 5-6 weeks.

## 2016-02-22 DIAGNOSIS — M25511 Pain in right shoulder: Secondary | ICD-10-CM | POA: Diagnosis present

## 2016-02-22 MED ORDER — METHYLPREDNISOLONE ACETATE 40 MG/ML IJ SUSP
40.0000 mg | Freq: Once | INTRAMUSCULAR | Status: AC
  Administered 2016-02-22: 40 mg via INTRA_ARTICULAR

## 2016-02-26 DIAGNOSIS — M25511 Pain in right shoulder: Secondary | ICD-10-CM | POA: Insufficient documentation

## 2016-02-26 NOTE — Assessment & Plan Note (Signed)
2/2 rotator cuff impingement.  Remote tear 3 years ago but exam doesn't indicate weakness.  Shown home exercises to do daily.  Subacromial injection given as well.  Consider short term use of nsaids.  F/u in 5-6 weeks.  Consider imaging, PT, nitro patches if not improving.  After informed written consent, patient was seated on exam table. Right shoulder was prepped with alcohol swab and utilizing posterior approach, patient's right subacromial space was injected with 3:1 marcaine: depomedrol. Patient tolerated the procedure well without immediate complications.

## 2016-02-26 NOTE — Progress Notes (Signed)
PCP and consultation requested by: Penni Homans, MD  Subjective:   HPI: Patient is a 70 y.o. male here for right shoulder pain.  Patient reports he was told about 3 years ago he had a small rotator cuff tear in his right shoulder. No surgery undertaken and he improved. States over past 2-3 months he's had posterior 4/10 level shoulder pain that is sharp. Worse with reaching, overhead motions, and at nighttime. He is right handed. No skin changes, numbness. Doing home exercises and tried ibuprofen 200mg  at a time.  Past Medical History:  Diagnosis Date  . Anemia   . Anemia 08/14/2014  . Anxiety   . Arthritis   . Back pain 10/10/2012  . Fatty liver 2005   seen on 2005 ultrasound,05/2015 CT.   Marland Kitchen Hemorrhoids, internal, with bleeding 2005   internal and external. 2005 band ligation (dr Ferdinand Lango in [facility])  . Hyperglycemia 08/21/2013  . Hyperlipidemia   . Hypertension   . Nocturia 08/21/2013  . Obesity (BMI 30.0-34.9)   . Osteoporosis   . Rectal bleeding 07/30/2014  . 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 Unspecified constipation 02/06/2014  . Vitamin D deficiency     Current Outpatient Prescriptions on File Prior to Visit  Medication Sig Dispense Refill  . ALPRAZolam (XANAX) 0.5 MG tablet Take 1 tablet (0.5 mg total) by mouth 2 (two) times daily as needed for anxiety. 8 tablet 0  . amLODipine-benazepril (LOTREL) 5-20 MG capsule Take 1 capsule by mouth daily. 90 capsule 2  . azelastine (OPTIVAR) 0.05 % ophthalmic solution Place 1 drop into the left eye 2 (two) times daily. 6 mL 1  . ferrous fumarate (HEMOCYTE - 106 MG FE) 325 (106 Fe) MG TABS tablet Take 1 tablet (106 mg of iron total) by mouth daily. 30 tablet 3  . fluticasone (FLONASE) 50 MCG/ACT nasal spray Place 2 sprays into both nostrils daily. 16 g 6  . hydrocortisone (ANUSOL-HC) 25 MG suppository Place 1 suppository (25 mg total) rectally 2 (two) times daily. 12 suppository 0  . ibuprofen (ADVIL,MOTRIN)  400 MG tablet Take 1 tablet (400 mg total) by mouth every 8 (eight) hours as needed. 40 tablet 3  . metoprolol (LOPRESSOR) 50 MG tablet Take 1 tablet (50 mg total) by mouth daily. 90 tablet 2  . Pitavastatin Calcium (LIVALO) 2 MG TABS Take 1 tablet (2 mg total) by mouth daily. 90 tablet 2   No current facility-administered medications on file prior to visit.     Past Surgical History:  Procedure Laterality Date  . COLONOSCOPY  before 2005, 2012, 2013, 08/2014.   Marland Kitchen FRACTURE SURGERY Left 1989   leg  . HEMORRHOID BANDING  2005  . KNEE SURGERY Left 2010   arthroscopy, torn carilage  . KNEE SURGERY Right 2012   arthroscopy  . TONSILLECTOMY  1957    No Known Allergies  Social History   Social History  . Marital status: Married    Spouse name: N/A  . Number of children: 1  . Years of education: N/A   Occupational History  . Retired [employer]   Social History Main Topics  . Smoking status: Never Smoker  . Smokeless tobacco: Never Used  . Alcohol use 1.2 oz/week    2 Standard drinks or equivalent per week     Comment: Occassionally  . Drug use: No  . Sexual activity: No   Other Topics Concern  . Not on file   Social History Narrative  .  No narrative on file    Family History  Problem Relation Age of Onset  . Stroke Mother   . Aneurysm Mother     brain  . Arthritis Father   . Liver cancer Father     liver, atomic testing in [employer]  . Gallbladder disease Father   . Hyperlipidemia Sister   . Hypertension Sister   . Obesity Sister   . Diabetes Sister   . Diabetes Brother   . Hyperlipidemia Sister   . Hypertension Sister   . Obesity Sister   . Diabetes Sister   . Aneurysm Brother     brain  . COPD Brother   . Colon cancer Neg Hx   . Colon polyps Neg Hx   . Esophageal cancer Neg Hx   . Rectal cancer Neg Hx   . Stomach cancer Neg Hx   . Gallbladder disease Sister     x4    BP 128/86   Pulse 72   Ht 5\' 11"  (1.803 m)   Wt 215 lb (97.5 kg)    BMI 29.99 kg/m   Review of Systems: See HPI above.    Objective:  Physical Exam:  Gen: NAD, comfortable in exam room  Right shoulder: No swelling, ecchymoses.  No gross deformity. TTP inferior to acromion laterally.  No other tenderness including AC joint, biceps tendon. FROM with painful arc. Positive Hawkins, negative Neers. Negative Yergasons. Strength 5/5 with empty can and resisted internal/external rotation.  Pain empty can. Negative apprehension. NV intact distally.  Left shoulder: FROM without pain.    Assessment & Plan:  1. Right shoulder pain - 2/2 rotator cuff impingement.  Remote tear 3 years ago but exam doesn't indicate weakness.  Shown home exercises to do daily.  Subacromial injection given as well.  Consider short term use of nsaids.  F/u in 5-6 weeks.  Consider imaging, PT, nitro patches if not improving.  After informed written consent, patient was seated on exam table. Right shoulder was prepped with alcohol swab and utilizing posterior approach, patient's right subacromial space was injected with 3:1 marcaine: depomedrol. Patient tolerated the procedure well without immediate complications.

## 2016-03-03 NOTE — Assessment & Plan Note (Signed)
Well controlled, no changes to meds. Encouraged heart healthy diet such as the DASH diet and exercise as tolerated.  °

## 2016-03-03 NOTE — Assessment & Plan Note (Signed)
Referred to sports med for further consideration. Encouraged moist heat and gentle stretching as tolerated. May try NSAIDs and prescription meds as directed and report if symptoms worsen or seek immediate care

## 2016-03-03 NOTE — Assessment & Plan Note (Signed)
Tolerating statin, encouraged heart healthy diet, avoid trans fats, minimize simple carbs and saturated fats. Increase exercise as tolerated 

## 2016-03-03 NOTE — Progress Notes (Signed)
Patient ID: [name], male   DOB: [birth year], 70 y.o.   MRN: [id number]   Subjective:    Patient ID: [name], male    DOB: Dec 22, 1945, 70 y.o.   MRN: [id number]  Chief Complaint  Patient presents with  . Follow-up    HPI Patient is in today for follow up. Has persistent right shoulder pain, pain into upper back as well. No recent fall or injury. Has some intermittent abdominal discomfort. No change in bowels, bloody stool, melena, N/V, anorexia. Does note some itching in let eye at times. No discharge or visual changes. Falls asleep OK but wakes up within 2-3 hours most night. Denies CP/palp/SOB/HA/congestion/fevers/GI or GU c/o. Taking meds as prescribed  Past Medical History:  Diagnosis Date  . Anemia   . Anemia 08/14/2014  . Anxiety   . Arthritis   . Back pain 10/10/2012  . Fatty liver 2005   seen on 2005 ultrasound,05/2015 CT.   Marland Kitchen Hemorrhoids, internal, with bleeding 2005   internal and external. 2005 band ligation (dr Ferdinand Lango in [facility])  . Hyperglycemia 08/21/2013  . Hyperlipidemia   . Hypertension   . Nocturia 08/21/2013  . Obesity (BMI 30.0-34.9)   . Osteoporosis   . Rectal bleeding 07/30/2014  . 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 Unspecified constipation 02/06/2014  . Vitamin D deficiency     Past Surgical History:  Procedure Laterality Date  . COLONOSCOPY  before 2005, 2012, 2013, 08/2014.   Marland Kitchen FRACTURE SURGERY Left 1989   leg  . HEMORRHOID BANDING  2005  . KNEE SURGERY Left 2010   arthroscopy, torn carilage  . KNEE SURGERY Right 2012   arthroscopy  . TONSILLECTOMY  1957    Family History  Problem Relation Age of Onset  . Stroke Mother   . Aneurysm Mother     brain  . Arthritis Father   . Liver cancer Father     liver, atomic testing in [employer]  . Gallbladder disease Father   . Hyperlipidemia Sister   . Hypertension Sister   . Obesity Sister   . Diabetes Sister   . Diabetes Brother   . Hyperlipidemia Sister   .  Hypertension Sister   . Obesity Sister   . Diabetes Sister   . Aneurysm Brother     brain  . COPD Brother   . Colon cancer Neg Hx   . Colon polyps Neg Hx   . Esophageal cancer Neg Hx   . Rectal cancer Neg Hx   . Stomach cancer Neg Hx   . Gallbladder disease Sister     x4    Social History   Social History  . Marital status: Married    Spouse name: N/A  . Number of children: 1  . Years of education: N/A   Occupational History  . Retired [employer]   Social History Main Topics  . Smoking status: Never Smoker  . Smokeless tobacco: Never Used  . Alcohol use 1.2 oz/week    2 Standard drinks or equivalent per week     Comment: Occassionally  . Drug use: No  . Sexual activity: No   Other Topics Concern  . Not on file   Social History Narrative  . No narrative on file    Outpatient Medications Prior to Visit  Medication Sig Dispense Refill  . ALPRAZolam (XANAX) 0.5 MG tablet Take 1 tablet (0.5 mg total) by mouth 2 (two) times daily as needed for anxiety. 8 tablet  0  . fluticasone (FLONASE) 50 MCG/ACT nasal spray Place 2 sprays into both nostrils daily. 16 g 6  . hydrocortisone (ANUSOL-HC) 25 MG suppository Place 1 suppository (25 mg total) rectally 2 (two) times daily. 12 suppository 0  . amLODipine-benazepril (LOTREL) 5-20 MG capsule TAKE 1 CAPSULE DAILY 90 capsule 0  . doxycycline (VIBRA-TABS) 100 MG tablet Take 1 tablet (100 mg total) by mouth 2 (two) times daily. 14 tablet 0  . doxycycline (VIBRAMYCIN) 100 MG capsule Take 1 capsule (100 mg total) by mouth 2 (two) times daily. 20 capsule 0  . Guaifenesin 1200 MG TB12 Take 1 tablet (1,200 mg total) by mouth 2 (two) times daily. 20 each 0  . metoprolol (LOPRESSOR) 50 MG tablet TAKE 1 TABLET DAILY 90 tablet 0  . Pitavastatin Calcium (LIVALO) 2 MG TABS Take 1 tablet (2 mg total) by mouth daily. 30 tablet 0  . promethazine-dextromethorphan (PROMETHAZINE-DM) 6.25-15 MG/5ML syrup Take 5 mLs by mouth 4 (four) times  daily as needed for cough. 120 mL 0   No facility-administered medications prior to visit.     No Known Allergies  Review of Systems  Constitutional: Negative for fever and malaise/fatigue.  HENT: Negative for congestion.   Eyes: Negative for blurred vision.  Respiratory: Negative for shortness of breath.   Cardiovascular: Negative for chest pain, palpitations and leg swelling.  Gastrointestinal: Negative for abdominal pain, blood in stool and nausea.  Genitourinary: Negative for dysuria and frequency.  Musculoskeletal: Positive for back pain and joint pain. Negative for falls.  Skin: Negative for rash.  Neurological: Negative for dizziness, loss of consciousness and headaches.  Endo/Heme/Allergies: Negative for environmental allergies.  Psychiatric/Behavioral: Negative for depression. The patient has insomnia. The patient is not nervous/anxious.        Objective:    Physical Exam  Constitutional: He is oriented to person, place, and time. He appears well-developed and well-nourished. No distress.  HENT:  Head: Normocephalic and atraumatic.  Nose: Nose normal.  Eyes: Right eye exhibits no discharge. Left eye exhibits no discharge.  Neck: Normal range of motion. Neck supple.  Cardiovascular: Normal rate and regular rhythm.   No murmur heard. Pulmonary/Chest: Effort normal and breath sounds normal.  Abdominal: Soft. Bowel sounds are normal. There is no tenderness.  Musculoskeletal: He exhibits no edema.  Neurological: He is alert and oriented to person, place, and time.  Skin: Skin is warm and dry.  Psychiatric: He has a normal mood and affect.  Nursing note and vitals reviewed.   BP 118/78 (BP Location: Left Arm, Patient Position: Sitting, Cuff Size: Normal)   Pulse 98   Temp 98.1 F (36.7 C) (Oral)   Resp 16   Ht 6\' 1"  (1.854 m)   Wt 216 lb 9.6 oz (98.2 kg)   SpO2 98%   BMI 28.58 kg/m  Wt Readings from Last 3 Encounters:  02/21/16 215 lb (97.5 kg)  02/19/16 216  lb 9.6 oz (98.2 kg)  12/31/15 215 lb 6.4 oz (97.7 kg)     Lab Results  Component Value Date   WBC 5.4 02/19/2016   HGB 11.8 (L) 02/19/2016   HCT 35.7 (L) 02/19/2016   PLT 194.0 02/19/2016   GLUCOSE 90 02/19/2016   CHOL 145 02/19/2016   TRIG 185.0 (H) 02/19/2016   HDL 41.20 02/19/2016   LDLCALC 66 02/19/2016   ALT 17 02/19/2016   AST 17 02/19/2016   NA 139 02/19/2016   K 4.3 02/19/2016   CL 106 02/19/2016   CREATININE  1.28 02/19/2016   BUN 14 02/19/2016   CO2 29 02/19/2016   TSH 1.46 02/19/2016   PSA 1.08 08/07/2014   HGBA1C 6.0 08/07/2014    Lab Results  Component Value Date   TSH 1.46 02/19/2016   Lab Results  Component Value Date   WBC 5.4 02/19/2016   HGB 11.8 (L) 02/19/2016   HCT 35.7 (L) 02/19/2016   MCV 83.2 02/19/2016   PLT 194.0 02/19/2016   Lab Results  Component Value Date   NA 139 02/19/2016   K 4.3 02/19/2016   CO2 29 02/19/2016   GLUCOSE 90 02/19/2016   BUN 14 02/19/2016   CREATININE 1.28 02/19/2016   BILITOT 0.3 02/19/2016   ALKPHOS 70 02/19/2016   AST 17 02/19/2016   ALT 17 02/19/2016   PROT 7.0 02/19/2016   ALBUMIN 4.1 02/19/2016   CALCIUM 9.1 02/19/2016   ANIONGAP 8 06/13/2015   GFR 71.88 02/19/2016   Lab Results  Component Value Date   CHOL 145 02/19/2016   Lab Results  Component Value Date   HDL 41.20 02/19/2016   Lab Results  Component Value Date   LDLCALC 66 02/19/2016   Lab Results  Component Value Date   TRIG 185.0 (H) 02/19/2016   Lab Results  Component Value Date   CHOLHDL 4 02/19/2016   Lab Results  Component Value Date   HGBA1C 6.0 08/07/2014       Assessment & Plan:   Problem List Items Addressed This Visit    Hyperlipidemia    Tolerating statin, encouraged heart healthy diet, avoid trans fats, minimize simple carbs and saturated fats. Increase exercise as tolerated      Relevant Medications   Pitavastatin Calcium (LIVALO) 2 MG TABS   amLODipine-benazepril (LOTREL) 5-20 MG capsule   metoprolol  (LOPRESSOR) 50 MG tablet   Other Relevant Orders   Lipid panel (Completed)   Hypertension, essential     Well controlled, no changes to meds. Encouraged heart healthy diet such as the DASH diet and exercise as tolerated.       Relevant Medications   Pitavastatin Calcium (LIVALO) 2 MG TABS   amLODipine-benazepril (LOTREL) 5-20 MG capsule   metoprolol (LOPRESSOR) 50 MG tablet   Other Relevant Orders   TSH (Completed)   CBC (Completed)   Comprehensive metabolic panel (Completed)   Obesity (BMI 30.0-34.9)    Encouraged DASH diet, decrease po intake and increase exercise as tolerated. Needs 7-8 hours of sleep nightly. Avoid trans fats, eat small, frequent meals every 4-5 hours with lean proteins, complex carbs and healthy fats. Minimize simple carbs      Hypokalemia   Right shoulder pain    Referred to sports med for further consideration. Encouraged moist heat and gentle stretching as tolerated. May try NSAIDs and prescription meds as directed and report if symptoms worsen or seek immediate care       Other Visit Diagnoses    Shoulder blade pain    -  Primary   Relevant Orders   Ambulatory referral to Sports Medicine   Preventative health care       Relevant Orders   Hepatitis C Antibody (Completed)   Muscle spasm       Relevant Orders   Magnesium (Completed)   Anemia, unspecified       Relevant Medications   ferrous fumarate (HEMOCYTE - 106 MG FE) 325 (106 Fe) MG TABS tablet   Other Relevant Orders   CBC w/Diff      I have discontinued  Mr. Strohmeyer Guaifenesin, doxycycline, and doxycycline. I have also changed his amLODipine-benazepril and metoprolol. Additionally, I am having him start on ibuprofen and ferrous fumarate. Lastly, I am having him maintain his hydrocortisone, fluticasone, ALPRAZolam, azelastine, and Pitavastatin Calcium.  Meds ordered this encounter  Medications  . DISCONTD: Pitavastatin Calcium (LIVALO) 2 MG TABS    Sig: Take 1 tablet (2 mg total) by mouth  daily.    Dispense:  90 tablet    Refill:  1    Patient needs to schedule appointment with labs for further refills.  Marland Kitchen azelastine (OPTIVAR) 0.05 % ophthalmic solution    Sig: Place 1 drop into the left eye 2 (two) times daily.    Dispense:  6 mL    Refill:  1  . Pitavastatin Calcium (LIVALO) 2 MG TABS    Sig: Take 1 tablet (2 mg total) by mouth daily.    Dispense:  90 tablet    Refill:  2    Patient needs to schedule appointment with labs for further refills.  Marland Kitchen amLODipine-benazepril (LOTREL) 5-20 MG capsule    Sig: Take 1 capsule by mouth daily.    Dispense:  90 capsule    Refill:  2    Needs appointment for further refills  . metoprolol (LOPRESSOR) 50 MG tablet    Sig: Take 1 tablet (50 mg total) by mouth daily.    Dispense:  90 tablet    Refill:  2    Needs appointment for further refills.  Marland Kitchen ibuprofen (ADVIL,MOTRIN) 400 MG tablet    Sig: Take 1 tablet (400 mg total) by mouth every 8 (eight) hours as needed.    Dispense:  40 tablet    Refill:  3  . ferrous fumarate (HEMOCYTE - 106 MG FE) 325 (106 Fe) MG TABS tablet    Sig: Take 1 tablet (106 mg of iron total) by mouth daily.    Dispense:  30 tablet    Refill:  3     Penni Homans, MD

## 2016-03-03 NOTE — Assessment & Plan Note (Signed)
Encouraged DASH diet, decrease po intake and increase exercise as tolerated. Needs 7-8 hours of sleep nightly. Avoid trans fats, eat small, frequent meals every 4-5 hours with lean proteins, complex carbs and healthy fats. Minimize simple carbs 

## 2016-03-11 ENCOUNTER — Encounter: Payer: Self-pay | Admitting: Family Medicine

## 2016-03-11 ENCOUNTER — Ambulatory Visit (INDEPENDENT_AMBULATORY_CARE_PROVIDER_SITE_OTHER): Payer: Medicare Other | Admitting: Family Medicine

## 2016-03-11 VITALS — BP 108/76 | HR 79 | Temp 98.6°F | Ht 71.0 in | Wt 213.1 lb

## 2016-03-11 DIAGNOSIS — Z23 Encounter for immunization: Secondary | ICD-10-CM

## 2016-03-11 DIAGNOSIS — R252 Cramp and spasm: Secondary | ICD-10-CM

## 2016-03-11 DIAGNOSIS — E669 Obesity, unspecified: Secondary | ICD-10-CM

## 2016-03-11 DIAGNOSIS — I10 Essential (primary) hypertension: Secondary | ICD-10-CM | POA: Diagnosis not present

## 2016-03-11 DIAGNOSIS — E785 Hyperlipidemia, unspecified: Secondary | ICD-10-CM

## 2016-03-11 DIAGNOSIS — D649 Anemia, unspecified: Secondary | ICD-10-CM

## 2016-03-11 DIAGNOSIS — R1084 Generalized abdominal pain: Secondary | ICD-10-CM

## 2016-03-11 DIAGNOSIS — M25511 Pain in right shoulder: Secondary | ICD-10-CM

## 2016-03-11 MED ORDER — HYDROCORTISONE ACETATE 25 MG RE SUPP: 25.0000 mg | Freq: Two times a day (BID) | RECTAL | 3 refills | Status: DC

## 2016-03-11 MED ORDER — HYOSCYAMINE SULFATE 0.125 MG SL SUBL: 0.1250 mg | SUBLINGUAL_TABLET | SUBLINGUAL | 1 refills | Status: DC | PRN

## 2016-03-11 NOTE — Patient Instructions (Signed)
Lidocaine patches, Aspercreme, Salon Pas or Icy Hot 64 oz of clear fluids Magnesium 50 mg daily Hyland's leg cramp med  Muscle Cramps and Spasms Muscle cramps and spasms occur when a muscle or muscles tighten and you have no control over this tightening (involuntary muscle contraction). They are a common problem and can develop in any muscle. The most common place is in the calf muscles of the leg. Both muscle cramps and muscle spasms are involuntary muscle contractions, but they also have differences:   Muscle cramps are sporadic and painful. They may last a few seconds to a quarter of an hour. Muscle cramps are often more forceful and last longer than muscle spasms.  Muscle spasms may or may not be painful. They may also last just a few seconds or much longer. CAUSES  It is uncommon for cramps or spasms to be due to a serious underlying problem. In many cases, the cause of cramps or spasms is unknown. Some common causes are:   Overexertion.   Overuse from repetitive motions (doing the same thing over and over).   Remaining in a certain position for a long period of time.   Improper preparation, form, or technique while performing a sport or activity.   Dehydration.   Injury.   Side effects of some medicines.   Abnormally low levels of the salts and ions in your blood (electrolytes), especially potassium and calcium. This could happen if you are taking water pills (diuretics) or you are pregnant.  Some underlying medical problems can make it more likely to develop cramps or spasms. These include, but are not limited to:   Diabetes.   Parkinson disease.   Hormone disorders, such as thyroid problems.   Alcohol abuse.   Diseases specific to muscles, joints, and bones.   Blood vessel disease where not enough blood is getting to the muscles.  HOME CARE INSTRUCTIONS   Stay well hydrated. Drink enough water and fluids to keep your urine clear or pale yellow.  It  may be helpful to massage, stretch, and relax the affected muscle.  For tight or tense muscles, use a warm towel, heating pad, or hot shower water directed to the affected area.  If you are sore or have pain after a cramp or spasm, applying ice to the affected area may relieve discomfort.  Put ice in a plastic bag.  Place a towel between your skin and the bag.  Leave the ice on for 15-20 minutes, 03-04 times a day.  Medicines used to treat a known cause of cramps or spasms may help reduce their frequency or severity. Only take over-the-counter or prescription medicines as directed by your caregiver. SEEK MEDICAL CARE IF:  Your cramps or spasms get more severe, more frequent, or do not improve over time.  MAKE SURE YOU:   Understand these instructions.  Will watch your condition.  Will get help right away if you are not doing well or get worse.   This information is not intended to replace advice given to you by your health care provider. Make sure you discuss any questions you have with your health care provider.   Document Released: 12/06/2001 Document Revised: 10/11/2012 Document Reviewed: 06/02/2012 Elsevier Interactive Patient Education [employer].

## 2016-03-11 NOTE — Progress Notes (Signed)
Pre visit review using our clinic review tool, if applicable. No additional management support is needed unless otherwise documented below in the visit note. 

## 2016-03-12 ENCOUNTER — Other Ambulatory Visit (INDEPENDENT_AMBULATORY_CARE_PROVIDER_SITE_OTHER): Payer: Medicare Other

## 2016-03-12 DIAGNOSIS — R252 Cramp and spasm: Secondary | ICD-10-CM | POA: Diagnosis not present

## 2016-03-12 DIAGNOSIS — D649 Anemia, unspecified: Secondary | ICD-10-CM | POA: Diagnosis not present

## 2016-03-12 LAB — RETICULOCYTES
ABS Retic: 69920 cells/uL (ref 25000–90000)
RBC.: 4.37 MIL/uL (ref 4.20–5.80)
Retic Ct Pct: 1.6 %

## 2016-03-12 MED FILL — ANUCORT-HC 25 MG SUPPOSITOR: 25 | 6 days supply | Qty: 12 | Fill #0

## 2016-03-12 MED FILL — OSCIMIN SL 0.125 MG TABLET: 0.125 | 5 days supply | Qty: 30 | Fill #0

## 2016-03-13 LAB — CBC
HCT: 35.8 % — ABNORMAL LOW (ref 39.0–52.0)
Hemoglobin: 12.1 g/dL — ABNORMAL LOW (ref 13.0–17.0)
MCHC: 33.8 g/dL (ref 30.0–36.0)
MCV: 83 fl (ref 78.0–100.0)
Platelets: 188 10*3/uL (ref 150.0–400.0)
RBC: 4.31 Mil/uL (ref 4.22–5.81)
RDW: 14.4 % (ref 11.5–15.5)
WBC: 4.7 10*3/uL (ref 4.0–10.5)

## 2016-03-13 LAB — MAGNESIUM: Magnesium: 1.7 mg/dL (ref 1.5–2.5)

## 2016-03-16 ENCOUNTER — Encounter: Payer: Self-pay | Admitting: Family Medicine

## 2016-03-16 DIAGNOSIS — D649 Anemia, unspecified: Secondary | ICD-10-CM | POA: Insufficient documentation

## 2016-03-16 NOTE — Assessment & Plan Note (Signed)
Is established with sports med

## 2016-03-16 NOTE — Assessment & Plan Note (Signed)
Well controlled, no changes to meds. Encouraged heart healthy diet such as the DASH diet and exercise as tolerated.  °

## 2016-03-16 NOTE — Assessment & Plan Note (Signed)
Increase leafy greens, consider increased lean red meat and using cast iron cookware. Continue to monitor, report any concerns 

## 2016-03-16 NOTE — Assessment & Plan Note (Signed)
Tolerating statin, encouraged heart healthy diet, avoid trans fats, minimize simple carbs and saturated fats. Increase exercise as tolerated 

## 2016-03-16 NOTE — Assessment & Plan Note (Signed)
Encouraged DASH diet, decrease po intake and increase exercise as tolerated. Needs 7-8 hours of sleep nightly. Avoid trans fats, eat small, frequent meals every 4-5 hours with lean proteins, complex carbs and healthy fats. Minimize simple carbs 

## 2016-03-16 NOTE — Progress Notes (Signed)
Patient ID: [name], male   DOB: Nov 08, 1945, 70 y.o.   MRN: [id number]   Subjective:    Patient ID: [name], male    DOB: 04/27/1946, 70 y.o.   MRN: [id number]  Chief Complaint  Patient presents with  . Follow-up    HPI Patient is in today for follow up. His abdominal pain is less frequent and more manageable. No new illness or acute concerns. Is working on his shoulder with sports medicine. Denies CP/palp/SOB/HA/congestion/fevers/GI or GU c/o. Taking meds as prescribed  Past Medical History:  Diagnosis Date  . Anemia   . Anemia 08/14/2014  . Anemia 03/16/2016  . Anxiety   . Arthritis   . Back pain 10/10/2012  . Fatty liver 2005   seen on 2005 ultrasound,05/2015 CT.   Marland Kitchen Hemorrhoids, internal, with bleeding 2005   internal and external. 2005 band ligation (dr Ferdinand Lango in [facility])  . Hyperglycemia 08/21/2013  . Hyperlipidemia   . Hypertension   . Nocturia 08/21/2013  . Obesity (BMI 30.0-34.9)   . Osteoporosis   . Rectal bleeding 07/30/2014  . 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 Unspecified constipation 02/06/2014  . Vitamin D deficiency     Past Surgical History:  Procedure Laterality Date  . COLONOSCOPY  before 2005, 2012, 2013, 08/2014.   Marland Kitchen FRACTURE SURGERY Left 1989   leg  . HEMORRHOID BANDING  2005  . KNEE SURGERY Left 2010   arthroscopy, torn carilage  . KNEE SURGERY Right 2012   arthroscopy  . TONSILLECTOMY  1957    Family History  Problem Relation Age of Onset  . Stroke Mother   . Aneurysm Mother     brain  . Arthritis Father   . Liver cancer Father     liver, atomic testing in [employer]  . Gallbladder disease Father   . Hyperlipidemia Sister   . Hypertension Sister   . Obesity Sister   . Diabetes Sister   . Diabetes Brother   . Hyperlipidemia Sister   . Hypertension Sister   . Obesity Sister   . Diabetes Sister   . Aneurysm Brother     brain  . COPD Brother   . Colon cancer Neg Hx   . Colon polyps Neg Hx   . Esophageal  cancer Neg Hx   . Rectal cancer Neg Hx   . Stomach cancer Neg Hx   . Gallbladder disease Sister     x4    Social History   Social History  . Marital status: Married    Spouse name: N/A  . Number of children: 1  . Years of education: N/A   Occupational History  . Retired [employer]   Social History Main Topics  . Smoking status: Never Smoker  . Smokeless tobacco: Never Used  . Alcohol use 1.2 oz/week    2 Standard drinks or equivalent per week     Comment: Occassionally  . Drug use: No  . Sexual activity: No   Other Topics Concern  . Not on file   Social History Narrative  . No narrative on file    Outpatient Medications Prior to Visit  Medication Sig Dispense Refill  . ALPRAZolam (XANAX) 0.5 MG tablet Take 1 tablet (0.5 mg total) by mouth 2 (two) times daily as needed for anxiety. 8 tablet 0  . amLODipine-benazepril (LOTREL) 5-20 MG capsule Take 1 capsule by mouth daily. 90 capsule 2  . azelastine (OPTIVAR) 0.05 % ophthalmic solution Place 1 drop  into the left eye 2 (two) times daily. 6 mL 1  . ferrous fumarate (HEMOCYTE - 106 MG FE) 325 (106 Fe) MG TABS tablet Take 1 tablet (106 mg of iron total) by mouth daily. 30 tablet 3  . fluticasone (FLONASE) 50 MCG/ACT nasal spray Place 2 sprays into both nostrils daily. 16 g 6  . ibuprofen (ADVIL,MOTRIN) 400 MG tablet Take 1 tablet (400 mg total) by mouth every 8 (eight) hours as needed. 40 tablet 3  . metoprolol (LOPRESSOR) 50 MG tablet Take 1 tablet (50 mg total) by mouth daily. 90 tablet 2  . Pitavastatin Calcium (LIVALO) 2 MG TABS Take 1 tablet (2 mg total) by mouth daily. 90 tablet 2  . hydrocortisone (ANUSOL-HC) 25 MG suppository Place 1 suppository (25 mg total) rectally 2 (two) times daily. 12 suppository 0   No facility-administered medications prior to visit.     No Known Allergies  Review of Systems  Constitutional: Negative for fever and malaise/fatigue.  HENT: Negative for congestion.   Eyes:  Negative for blurred vision.  Respiratory: Negative for shortness of breath.   Cardiovascular: Negative for chest pain, palpitations and leg swelling.  Gastrointestinal: Positive for abdominal pain. Negative for blood in stool and nausea.  Genitourinary: Negative for dysuria and frequency.  Musculoskeletal: Negative for falls.  Skin: Negative for rash.  Neurological: Negative for dizziness, loss of consciousness and headaches.  Endo/Heme/Allergies: Negative for environmental allergies.  Psychiatric/Behavioral: Negative for depression. The patient is not nervous/anxious.        Objective:    Physical Exam  Constitutional: He is oriented to person, place, and time. He appears well-developed and well-nourished. No distress.  HENT:  Head: Normocephalic and atraumatic.  Nose: Nose normal.  Eyes: Right eye exhibits no discharge. Left eye exhibits no discharge.  Neck: Normal range of motion. Neck supple.  Cardiovascular: Normal rate and regular rhythm.   No murmur heard. Pulmonary/Chest: Effort normal and breath sounds normal.  Abdominal: Soft. Bowel sounds are normal. There is no tenderness.  Musculoskeletal: He exhibits no edema.  Neurological: He is alert and oriented to person, place, and time.  Skin: Skin is warm and dry.  Psychiatric: He has a normal mood and affect.  Nursing note and vitals reviewed.   BP 108/76 (BP Location: Left Arm, Patient Position: Sitting, Cuff Size: Normal)   Pulse 79   Temp 98.6 F (37 C) (Oral)   Ht 5\' 11"  (1.803 m)   Wt 213 lb 2 oz (96.7 kg)   SpO2 98%   BMI 29.72 kg/m  Wt Readings from Last 3 Encounters:  03/11/16 213 lb 2 oz (96.7 kg)  02/21/16 215 lb (97.5 kg)  02/19/16 216 lb 9.6 oz (98.2 kg)     Lab Results  Component Value Date   WBC 4.7 03/12/2016   HGB 12.1 (L) 03/12/2016   HCT 35.8 (L) 03/12/2016   PLT 188.0 03/12/2016   GLUCOSE 90 02/19/2016   CHOL 145 02/19/2016   TRIG 185.0 (H) 02/19/2016   HDL 41.20 02/19/2016    LDLCALC 66 02/19/2016   ALT 17 02/19/2016   AST 17 02/19/2016   NA 139 02/19/2016   K 4.3 02/19/2016   CL 106 02/19/2016   CREATININE 1.28 02/19/2016   BUN 14 02/19/2016   CO2 29 02/19/2016   TSH 1.46 02/19/2016   PSA 1.08 08/07/2014   HGBA1C 6.0 08/07/2014    Lab Results  Component Value Date   TSH 1.46 02/19/2016   Lab Results  Component  Value Date   WBC 4.7 03/12/2016   HGB 12.1 (L) 03/12/2016   HCT 35.8 (L) 03/12/2016   MCV 83.0 03/12/2016   PLT 188.0 03/12/2016   Lab Results  Component Value Date   NA 139 02/19/2016   K 4.3 02/19/2016   CO2 29 02/19/2016   GLUCOSE 90 02/19/2016   BUN 14 02/19/2016   CREATININE 1.28 02/19/2016   BILITOT 0.3 02/19/2016   ALKPHOS 70 02/19/2016   AST 17 02/19/2016   ALT 17 02/19/2016   PROT 7.0 02/19/2016   ALBUMIN 4.1 02/19/2016   CALCIUM 9.1 02/19/2016   ANIONGAP 8 06/13/2015   GFR 71.88 02/19/2016   Lab Results  Component Value Date   CHOL 145 02/19/2016   Lab Results  Component Value Date   HDL 41.20 02/19/2016   Lab Results  Component Value Date   LDLCALC 66 02/19/2016   Lab Results  Component Value Date   TRIG 185.0 (H) 02/19/2016   Lab Results  Component Value Date   CHOLHDL 4 02/19/2016   Lab Results  Component Value Date   HGBA1C 6.0 08/07/2014       Assessment & Plan:   Problem List Items Addressed This Visit    Hyperlipidemia    Tolerating statin, encouraged heart healthy diet, avoid trans fats, minimize simple carbs and saturated fats. Increase exercise as tolerated      Hypertension, essential     Well controlled, no changes to meds. Encouraged heart healthy diet such as the DASH diet and exercise as tolerated.       Obesity (BMI 30.0-34.9)    Encouraged DASH diet, decrease po intake and increase exercise as tolerated. Needs 7-8 hours of sleep nightly. Avoid trans fats, eat small, frequent meals every 4-5 hours with lean proteins, complex carbs and healthy fats. Minimize simple carbs       Generalized abdominal pain    Happening less frequently and more localized to LLQ. Use hyoscyamine prn. Encouraged fiber, hydration and probiotics. Report worsening symptoms      Right shoulder pain    Is established with sports med       Anemia - Primary    Increase leafy greens, consider increased lean red meat and using cast iron cookware. Continue to monitor, report any concerns      Relevant Orders   CBC (Completed)   Retic (Completed)    Other Visit Diagnoses    Encounter for immunization       Relevant Medications   hydrocortisone (ANUSOL-HC) 25 MG suppository   hyoscyamine (LEVSIN SL) 0.125 MG SL tablet   Other Relevant Orders   Flu vaccine HIGH DOSE PF (Completed)   Muscle cramps       Relevant Orders   Magnesium (Completed)      I am having Mr. Rametta start on hyoscyamine. I am also having him maintain his fluticasone, ALPRAZolam, azelastine, Pitavastatin Calcium, amLODipine-benazepril, metoprolol, ibuprofen, ferrous fumarate, and hydrocortisone.  Meds ordered this encounter  Medications  . hydrocortisone (ANUSOL-HC) 25 MG suppository    Sig: Place 1 suppository (25 mg total) rectally 2 (two) times daily.    Dispense:  12 suppository    Refill:  3  . hyoscyamine (LEVSIN SL) 0.125 MG SL tablet    Sig: Place 1 tablet (0.125 mg total) under the tongue every 4 (four) hours as needed.    Dispense:  30 tablet    Refill:  1     Penni Homans, MD

## 2016-03-16 NOTE — Assessment & Plan Note (Signed)
Happening less frequently and more localized to LLQ. Use hyoscyamine prn. Encouraged fiber, hydration and probiotics. Report worsening symptoms

## 2016-03-17 ENCOUNTER — Telehealth: Payer: Self-pay | Admitting: Family Medicine

## 2016-03-17 MED ORDER — PITAVASTATIN CALCIUM 2 MG PO TABS: 1.0000 | ORAL_TABLET | Freq: Every day | ORAL | 3 refills | Status: DC

## 2016-03-17 NOTE — Telephone Encounter (Signed)
[facility] Patient informed sent in.

## 2016-03-17 NOTE — Telephone Encounter (Signed)
°  Relationship to patient: Self  Can be reached: [phone]   Pharmacy:  [facility], [place] [phone] (Phone) [phone]      Reason for call: Request refill on Pitavastatin Calcium (LIVALO) 2 MG TABS [id number]

## 2016-03-20 MED FILL — FERROCITE TABLET: 324 | 30 days supply | Qty: 30 | Fill #1

## 2016-05-28 ENCOUNTER — Ambulatory Visit (INDEPENDENT_AMBULATORY_CARE_PROVIDER_SITE_OTHER): Payer: Medicare Other | Admitting: Physician Assistant

## 2016-05-28 ENCOUNTER — Encounter: Payer: Self-pay | Admitting: Physician Assistant

## 2016-05-28 VITALS — BP 128/80 | HR 64 | Ht 71.0 in | Wt 213.2 lb

## 2016-05-28 DIAGNOSIS — K5792 Diverticulitis of intestine, part unspecified, without perforation or abscess without bleeding: Secondary | ICD-10-CM

## 2016-05-28 DIAGNOSIS — K5909 Other constipation: Secondary | ICD-10-CM | POA: Diagnosis not present

## 2016-05-28 MED ORDER — METRONIDAZOLE 500 MG PO TABS: 500.0000 mg | ORAL_TABLET | Freq: Two times a day (BID) | ORAL | 0 refills | Status: DC

## 2016-05-28 MED ORDER — CIPROFLOXACIN HCL 500 MG PO TABS: 500.0000 mg | ORAL_TABLET | Freq: Two times a day (BID) | ORAL | 0 refills | Status: DC

## 2016-05-28 MED FILL — metroNIDAZOLE 500 MG TABS: 500 | 10 days supply | Qty: 20 | Fill #0

## 2016-05-28 MED FILL — CIPROFLOXACIN HCL 500 MG TA: 500 | 10 days supply | Qty: 20 | Fill #0

## 2016-05-28 NOTE — Patient Instructions (Signed)
[facility] sent prescriptions to [facility]. 1. Cipro 500 mg 2. Flagyl ( Metronidazol ) 500 mg  Take 1 dose of Miralax daily.  We have given you High Fiber diet and Diverticulosis handouts.  Call to speak to a nurse if your symptoms have not resolved after you finish the antibiotics.

## 2016-05-28 NOTE — Progress Notes (Signed)
Subjective:    Patient ID: [name], male    DOB: 05-15-1946, 70 y.o.   MRN: [id number]  HPI [name] is a pleasant 70 year old African-American male known to Dr. Carlean Purl who comes in today with complaints of left lower quadrant pain 2 weeks. Patient has history of adenomatous colon polyps and had colonoscopy last in February 2016 with finding of 2 small adenomatous polyps and sigmoid diverticulosis. He was hospitalized in December 2016 with acute abdominal pain and distention and loose stools. CT was done at that time showing mid and distal small bowel dilation and wall thickening involving a loop of small bowel in the anterior lower abdomen felt most consistent with partial obstruction sent secondary to an enteritis. He was treated empirically with Cipro and Flagyl and improved.  : He subsequently underwent CT enterography which was negative. We last saw him in January 2017. He comes in today stating that he is been having left lower quadrant abdominal pain "coming and going" over the past couple of weeks. He has noticed some increase in discomfort and cramping with bowel movements. No melena or hematochezia, no problems with diarrhea and in fact has been having problems with ongoing constipation. He has not had any associated fever or chills, no dysuria. Appetite fine, no nausea or vomiting. He has been taking MiraLAX on an every other day basis and using prune juice daily which works fairly well to help with constipation.   Review of Systems Pertinent positive and negative review of systems were noted in the above HPI section.  All other review of systems was otherwise negative.  Outpatient Encounter Prescriptions as of 05/28/2016  Medication Sig  . ALPRAZolam (XANAX) 0.5 MG tablet Take 1 tablet (0.5 mg total) by mouth 2 (two) times daily as needed for anxiety.  Marland Kitchen amLODipine-benazepril (LOTREL) 5-20 MG capsule Take 1 capsule by mouth daily.  Marland Kitchen azelastine (OPTIVAR) 0.05 % ophthalmic  solution Place 1 drop into the left eye 2 (two) times daily.  . ferrous fumarate (HEMOCYTE - 106 MG FE) 325 (106 Fe) MG TABS tablet Take 1 tablet (106 mg of iron total) by mouth daily.  . fluticasone (FLONASE) 50 MCG/ACT nasal spray Place 2 sprays into both nostrils daily.  . hydrocortisone (ANUSOL-HC) 25 MG suppository Place 1 suppository (25 mg total) rectally 2 (two) times daily.  . hyoscyamine (LEVSIN SL) 0.125 MG SL tablet Place 1 tablet (0.125 mg total) under the tongue every 4 (four) hours as needed.  Marland Kitchen ibuprofen (ADVIL,MOTRIN) 400 MG tablet Take 1 tablet (400 mg total) by mouth every 8 (eight) hours as needed.  . metoprolol (LOPRESSOR) 50 MG tablet Take 1 tablet (50 mg total) by mouth daily.  . Pitavastatin Calcium (LIVALO) 2 MG TABS Take 1 tablet (2 mg total) by mouth daily.  . ciprofloxacin (CIPRO) 500 MG tablet Take 1 tablet (500 mg total) by mouth 2 (two) times daily.  . metroNIDAZOLE (FLAGYL) 500 MG tablet Take 1 tablet (500 mg total) by mouth 2 (two) times daily.   No facility-administered encounter medications on file as of 05/28/2016.    No Known Allergies Patient Active Problem List   Diagnosis Date Noted  . Anemia 03/16/2016  . Right shoulder pain 02/26/2016  . SBO (small bowel obstruction)   . Abnormal CT scan, gastrointestinal tract   . Hypokalemia 06/10/2015  . Loose stools 06/07/2015  . Generalized abdominal pain   . Hyperlipidemia   . Hypertension, essential    . History of colon polyps   .  Obesity (BMI 30.0-34.9)    Social History   Social History  . Marital status: Married    Spouse name: N/A  . Number of children: 1  . Years of education: N/A   Occupational History  . Retired [employer]   Social History Main Topics  . Smoking status: Never Smoker  . Smokeless tobacco: Never Used  . Alcohol use 1.2 oz/week    2 Standard drinks or equivalent per week     Comment: Occassionally  . Drug use: No  . Sexual activity: No   Other Topics  Concern  . Not on file   Social History Narrative  . No narrative on file    [name] family history includes Aneurysm in his brother and mother; Arthritis in his father; COPD in his brother; Diabetes in his brother, sister, and sister; Gallbladder disease in his father and sister; Hyperlipidemia in his sister and sister; Hypertension in his sister and sister; Liver cancer in his father; Obesity in his sister and sister; Stroke in his mother.      Objective:    Vitals:   05/28/16 0820  BP: 128/80  Pulse: 64    Physical Exam   well-developed older African-American male in no acute distress, pleasant blood pressure 124/80 pulse 64 BMI 29.7. HEENT; nontraumatic, normocephalic EOMI PERRLA sclera anicteric, Cardiovascular ;regular rate and rhythm with S1-S2 no murmur or gallop, Pulmonary ;clear bilaterally Abdomen; soft, bowel sounds are present, he is tender in the left lower quadrant is no guarding or rebound no palpable mass or hepatosplenomegaly bowel sounds are present, Rectal; exam not done, Extremities; no clubbing cyanosis or edema skin warm and dry, Neuropsych; mood and affect appropriate       Assessment & Plan:   #16  70 year old African-American male with 2 week history of left lower quadrant pain. I suspect he has an acute sigmoid diverticulitis # 2 chronic constipation #3 history of adenomatous colon polyps, last colonoscopy February 2016 due for follow-up 2021 #4 hypertension  #5 status post hospitalization December 2016 with partial small bowel obstruction secondary to an enteritis, no recurrence  Plan; we will start Cipro 500 mg by mouth twice a day 10 days and Flagyl 500 mg by mouth twice a day 10 days. Continue prune juice and increase MiraLAX to once daily. Pt t is asked to call should he having any worsening in symptoms , or if he fails to improve on antibiotics as outlined above. At that point he would likely need imaging with CT.   Dorian Duval Genia Harold  PA-C 05/28/2016   Cc: Mosie Lukes, MD

## 2016-06-08 NOTE — Progress Notes (Signed)
Agree with Ms. Esterwood's assessment and plan. Carl E. Gessner, MD, FACG   

## 2016-06-09 MED FILL — IBUPROFEN 400 MG TABLET: 400 | 14 days supply | Qty: 40 | Fill #1

## 2016-08-15 ENCOUNTER — Telehealth: Payer: Self-pay | Admitting: *Deleted

## 2016-08-15 NOTE — Telephone Encounter (Signed)
Scheduled AWV 08/22/16 @8 

## 2016-08-19 NOTE — Progress Notes (Signed)
Subjective:   [name] is a 71 y.o. male who presents for Medicare Annual/Subsequent preventive examination.  Review of Systems:  No ROS.  Medicare Wellness Visit.  Cardiac Risk Factors include: advanced age (>80men, >82 women);dyslipidemia;hypertension;male gender Sleep patterns:pt states sleep is very sporadic. Feels rested though. Does not want any meds to sleep.  Home Safety/Smoke Alarms:  Feels safe in home. Smoke alarms in place.  Living environment; residence and Firearm Safety: Lives with wife in 2 story home. Guns safely stored.  Seat Belt Safety/Bike Helmet: Wears seat belt.   Counseling:   Eye Exam- pt states he goes about once every 2 yrs. Dental- Dr.Thatch every 6 months.  Male:   CCS- Last 08/25/14:  One precancerous polyp removed. Recall 5 yrs per report. PSA-  Lab Results  Component Value Date   PSA 1.08 08/07/2014   PSA 0.80 11/23/2012       Objective:    Vitals: BP 140/80 (BP Location: Right Arm, Patient Position: Sitting, Cuff Size: Normal)   Pulse 74   Ht 5\' 11"  (1.803 m)   Wt 217 lb (98.4 kg)   SpO2 99%   BMI 30.27 kg/m   Body mass index is 30.27 kg/m.  Tobacco History  Smoking Status  . Never Smoker  Smokeless Tobacco  . Never Used     Counseling given: Not Answered   Past Medical History:  Diagnosis Date  . Anemia   . Anemia 08/14/2014  . Anemia 03/16/2016  . Anxiety   . Arthritis   . Back pain 10/10/2012  . Fatty liver 2005   seen on 2005 ultrasound,05/2015 CT.   Marland Kitchen Hemorrhoids, internal, with bleeding 2005   internal and external. 2005 band ligation (dr Ferdinand Lango in [facility])  . Hyperglycemia 08/21/2013  . Hyperlipidemia   . Hypertension   . Nocturia 08/21/2013  . Obesity (BMI 30.0-34.9)   . Osteoporosis   . Rectal bleeding 07/30/2014  . 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 Unspecified constipation 02/06/2014  . Vitamin D deficiency    Past Surgical History:  Procedure Laterality Date  . COLONOSCOPY   before 2005, 2012, 2013, 08/2014.   Marland Kitchen FRACTURE SURGERY Left 1989   leg  . HEMORRHOID BANDING  2005  . KNEE SURGERY Left 2010   arthroscopy, torn carilage  . KNEE SURGERY Right 2012   arthroscopy  . TONSILLECTOMY  1957   Family History  Problem Relation Age of Onset  . Stroke Mother   . Aneurysm Mother     brain  . Arthritis Father   . Liver cancer Father     liver, atomic testing in [employer]  . Gallbladder disease Father   . Hyperlipidemia Sister   . Hypertension Sister   . Obesity Sister   . Diabetes Sister   . Diabetes Brother   . Hyperlipidemia Sister   . Hypertension Sister   . Obesity Sister   . Diabetes Sister   . Aneurysm Brother     brain  . COPD Brother   . Gallbladder disease Sister     x4  . Colon cancer Neg Hx   . Colon polyps Neg Hx   . Esophageal cancer Neg Hx   . Rectal cancer Neg Hx   . Stomach cancer Neg Hx    History  Sexual Activity  . Sexual activity: Yes    Outpatient Encounter Prescriptions as of 08/22/2016  Medication Sig  . ALPRAZolam (XANAX) 0.5 MG tablet Take 1 tablet (0.5 mg  total) by mouth 2 (two) times daily as needed for anxiety.  Marland Kitchen amLODipine-benazepril (LOTREL) 5-20 MG capsule Take 1 capsule by mouth daily.  Marland Kitchen azelastine (OPTIVAR) 0.05 % ophthalmic solution Place 1 drop into the left eye 2 (two) times daily.  . fluticasone (FLONASE) 50 MCG/ACT nasal spray Place 2 sprays into both nostrils daily.  . hydrocortisone (ANUSOL-HC) 25 MG suppository Place 1 suppository (25 mg total) rectally 2 (two) times daily.  . hyoscyamine (LEVSIN SL) 0.125 MG SL tablet Place 1 tablet (0.125 mg total) under the tongue every 4 (four) hours as needed.  Marland Kitchen ibuprofen (ADVIL,MOTRIN) 400 MG tablet Take 1 tablet (400 mg total) by mouth every 8 (eight) hours as needed.  . metoprolol (LOPRESSOR) 50 MG tablet Take 1 tablet (50 mg total) by mouth daily.  . Pitavastatin Calcium (LIVALO) 2 MG TABS Take 1 tablet (2 mg total) by mouth daily.  . [DISCONTINUED]  ciprofloxacin (CIPRO) 500 MG tablet Take 1 tablet (500 mg total) by mouth 2 (two) times daily.  . [DISCONTINUED] ferrous fumarate (HEMOCYTE - 106 MG FE) 325 (106 Fe) MG TABS tablet Take 1 tablet (106 mg of iron total) by mouth daily.  . [DISCONTINUED] metroNIDAZOLE (FLAGYL) 500 MG tablet Take 1 tablet (500 mg total) by mouth 2 (two) times daily.   No facility-administered encounter medications on file as of 08/22/2016.     Activities of Daily Living In your present state of health, do you have any difficulty performing the following activities: 08/22/2016  Hearing? N  Vision? N  Difficulty concentrating or making decisions? N  Walking or climbing stairs? N  Dressing or bathing? N  Doing errands, shopping? N  Preparing Food and eating ? N  Using the Toilet? N  In the past six months, have you accidently leaked urine? N  Do you have problems with loss of bowel control? N  Managing your Medications? N  Managing your Finances? N  Housekeeping or managing your Housekeeping? N  Some recent data might be hidden    Patient Care Team: Mosie Lukes, MD as PCP - General (Family Medicine)   Assessment:    Physical assessment deferred to PCP.  Exercise Activities and Dietary recommendations Current Exercise Habits: The patient does not participate in regular exercise at present, Exercise limited by: None identified Diet (meal preparation, eat out, water intake, caffeinated beverages, dairy products, fruits and vegetables): in general, a "healthy" diet  , on average, 3 meals per day   Goals    . Get back in the gym 3x/week      Fall Risk Fall Risk  08/22/2016 07/05/2015  Falls in the past year? No No   Depression Screen PHQ 2/9 Scores 08/22/2016 07/05/2015  PHQ - 2 Score 0 0    Cognitive Function MMSE - Mini Mental State Exam 08/22/2016  Orientation to time 5  Orientation to Place 5  Registration 3  Attention/ Calculation 5  Recall 3  Language- name 2 objects 2  Language- repeat 1   Language- follow 3 step command 3  Language- read & follow direction 1  Write a sentence 1  Copy design 1  Total score 30        Immunization History  Administered Date(s) Administered  . Influenza Whole 04/12/2013  . Influenza, High Dose Seasonal PF 03/11/2016  . Influenza,inj,Quad PF,36+ Mos 04/12/2015  . Influenza-Unspecified 04/13/2014  . Pneumococcal Conjugate-13 08/18/2013  . Pneumococcal Polysaccharide-23 02/19/2016  . Tdap 06/30/2010  . Zoster 06/30/2010   Screening Tests  Health Maintenance  Topic Date Due  . COLONOSCOPY  08/26/2019  . TETANUS/TDAP  06/30/2020  . INFLUENZA VACCINE  Completed  . Hepatitis C Screening  Completed  . PNA vac Low Risk Adult  Completed      Plan:     Continue to eat heart healthy diet (full of fruits, vegetables, whole grains, lean protein, water--limit salt, fat, and sugar intake) and increase physical activity as tolerated.  Bring a copy of your advance directives to your next office visit.  During the course of the visit the patient was educated and counseled about the following appropriate screening and preventive services:   Vaccines to include Pneumoccal, Influenza, Hepatitis B, Td, Zostavax, HCV  Cardiovascular Disease  Colorectal cancer screening  Diabetes screening  Prostate Cancer Screening  Glaucoma screening  Nutrition counseling   Patient Instructions (the written plan) was given to the patient.    Shela Nevin, [place]  08/22/2016   RN AWV note reviewed. Agree with documention and plan.  Penni Homans, MD

## 2016-08-19 NOTE — Progress Notes (Signed)
Pre visit review using our clinic review tool, if applicable. No additional management support is needed unless otherwise documented below in the visit note. 

## 2016-08-22 ENCOUNTER — Ambulatory Visit (INDEPENDENT_AMBULATORY_CARE_PROVIDER_SITE_OTHER): Payer: Medicare Other | Admitting: Family Medicine

## 2016-08-22 ENCOUNTER — Ambulatory Visit (HOSPITAL_BASED_OUTPATIENT_CLINIC_OR_DEPARTMENT_OTHER)
Admission: RE | Admit: 2016-08-22 | Discharge: 2016-08-22 | Disposition: A | Discharge status: Home / Self Care | Payer: Medicare Other | Source: Ambulatory Visit | Attending: Family Medicine | Admitting: Family Medicine

## 2016-08-22 ENCOUNTER — Encounter: Payer: Self-pay | Admitting: Family Medicine

## 2016-08-22 VITALS — BP 140/80 | HR 74 | Ht 71.0 in | Wt 217.0 lb

## 2016-08-22 DIAGNOSIS — E785 Hyperlipidemia, unspecified: Secondary | ICD-10-CM | POA: Diagnosis not present

## 2016-08-22 DIAGNOSIS — M545 Low back pain, unspecified: Secondary | ICD-10-CM

## 2016-08-22 DIAGNOSIS — R0789 Other chest pain: Secondary | ICD-10-CM

## 2016-08-22 DIAGNOSIS — Z0000 Encounter for general adult medical examination without abnormal findings: Secondary | ICD-10-CM | POA: Diagnosis not present

## 2016-08-22 DIAGNOSIS — M4807 Spinal stenosis, lumbosacral region: Secondary | ICD-10-CM | POA: Insufficient documentation

## 2016-08-22 DIAGNOSIS — D649 Anemia, unspecified: Secondary | ICD-10-CM

## 2016-08-22 DIAGNOSIS — G8929 Other chronic pain: Secondary | ICD-10-CM | POA: Insufficient documentation

## 2016-08-22 DIAGNOSIS — K649 Unspecified hemorrhoids: Secondary | ICD-10-CM | POA: Diagnosis not present

## 2016-08-22 DIAGNOSIS — E669 Obesity, unspecified: Secondary | ICD-10-CM | POA: Diagnosis not present

## 2016-08-22 DIAGNOSIS — I10 Essential (primary) hypertension: Secondary | ICD-10-CM

## 2016-08-22 HISTORY — DX: Unspecified hemorrhoids: K64.9

## 2016-08-22 LAB — LIPID PANEL
Cholesterol: 146 mg/dL (ref 0–200)
HDL: 43.7 mg/dL (ref >39.00)
LDL Cholesterol: 85 mg/dL (ref 0–99)
NonHDL: 102.59
Total CHOL/HDL Ratio: 3
Triglycerides: 90 mg/dL (ref 0.0–149.0)
VLDL: 18 mg/dL (ref 0.0–40.0)

## 2016-08-22 LAB — COMPREHENSIVE METABOLIC PANEL
ALT: 19 U/L (ref 0–53)
AST: 19 U/L (ref 0–37)
Albumin: 4.4 g/dL (ref 3.5–5.2)
Alkaline Phosphatase: 78 U/L (ref 39–117)
BUN: 11 mg/dL (ref 6–23)
CO2: 28 mEq/L (ref 19–32)
Calcium: 9.5 mg/dL (ref 8.4–10.5)
Chloride: 108 mEq/L (ref 96–112)
Creatinine, Ser: 1.14 mg/dL (ref 0.40–1.50)
GFR: 82.03 mL/min (ref >60.00)
Glucose, Bld: 109 mg/dL — ABNORMAL HIGH (ref 70–99)
Potassium: 4.2 mEq/L (ref 3.5–5.1)
Sodium: 141 mEq/L (ref 135–145)
Total Bilirubin: 0.7 mg/dL (ref 0.2–1.2)
Total Protein: 7.4 g/dL (ref 6.0–8.3)

## 2016-08-22 LAB — CBC
HCT: 38.4 % — ABNORMAL LOW (ref 39.0–52.0)
Hemoglobin: 12.6 g/dL — ABNORMAL LOW (ref 13.0–17.0)
MCHC: 32.8 g/dL (ref 30.0–36.0)
MCV: 84.3 fl (ref 78.0–100.0)
Platelets: 208 10*3/uL (ref 150.0–400.0)
RBC: 4.56 Mil/uL (ref 4.22–5.81)
RDW: 13.4 % (ref 11.5–15.5)
WBC: 5.2 10*3/uL (ref 4.0–10.5)

## 2016-08-22 LAB — TSH: TSH: 1.04 u[IU]/mL (ref 0.35–4.50)

## 2016-08-22 MED ORDER — ALPRAZOLAM 0.5 MG PO TABS: 0.5000 mg | ORAL_TABLET | Freq: Two times a day (BID) | ORAL | 2 refills | Status: DC | PRN

## 2016-08-22 MED FILL — ALPRAZolam 0.5 MG TABS: 0.5 | 5 days supply | Qty: 10 | Fill #0

## 2016-08-22 NOTE — Progress Notes (Signed)
Pre visit review using our clinic review tool, if applicable. No additional management support is needed unless otherwise documented below in the visit note. 

## 2016-08-22 NOTE — Patient Instructions (Addendum)
Continue to eat heart healthy diet (full of fruits, vegetables, whole grains, lean protein, water--limit salt, fat, and sugar intake) and increase physical activity as tolerated.  Bring a copy of your advance directives to your next office visit. Back Pain, Adult Back pain is very common in adults.The cause of back pain is rarely dangerous and the pain often gets better over time.The cause of your back pain may not be known. Some common causes of back pain include:  Strain of the muscles or ligaments supporting the spine.  Wear and tear (degeneration) of the spinal disks.  Arthritis.  Direct injury to the back. For many people, back pain may return. Since back pain is rarely dangerous, most people can learn to manage this condition on their own. Follow these instructions at home: Watch your back pain for any changes. The following actions may help to lessen any discomfort you are feeling:  Remain active. It is stressful on your back to sit or stand in one place for long periods of time. Do not sit, drive, or stand in one place for more than 30 minutes at a time. Take short walks on even surfaces as soon as you are able.Try to increase the length of time you walk each day.  Exercise regularly as directed by your health care provider. Exercise helps your back heal faster. It also helps avoid future injury by keeping your muscles strong and flexible.  Do not stay in bed.Resting more than 1-2 days can delay your recovery.  Pay attention to your body when you bend and lift. The most comfortable positions are those that put less stress on your recovering back. Always use proper lifting techniques, including:  Bending your knees.  Keeping the load close to your body.  Avoiding twisting.  Find a comfortable position to sleep. Use a firm mattress and lie on your side with your knees slightly bent. If you lie on your back, put a pillow under your knees.  Avoid feeling anxious or  stressed.Stress increases muscle tension and can worsen back pain.It is important to recognize when you are anxious or stressed and learn ways to manage it, such as with exercise.  Take medicines only as directed by your health care provider. Over-the-counter medicines to reduce pain and inflammation are often the most helpful.Your health care provider may prescribe muscle relaxant drugs.These medicines help dull your pain so you can more quickly return to your normal activities and healthy exercise.  Apply ice to the injured area:  Put ice in a plastic bag.  Place a towel between your skin and the bag.  Leave the ice on for 20 minutes, 2-3 times a day for the first 2-3 days. After that, ice and heat may be alternated to reduce pain and spasms.  Maintain a healthy weight. Excess weight puts extra stress on your back and makes it difficult to maintain good posture. Contact a health care provider if:  You have pain that is not relieved with rest or medicine.  You have increasing pain going down into the legs or buttocks.  You have pain that does not improve in one week.  You have night pain.  You lose weight.  You have a fever or chills. Get help right away if:  You develop new bowel or bladder control problems.  You have unusual weakness or numbness in your arms or legs.  You develop nausea or vomiting.  You develop abdominal pain.  You feel faint. This information is not intended to  replace advice given to you by your health care provider. Make sure you discuss any questions you have with your health care provider. Document Released: 06/16/2005 Document Revised: 10/25/2015 Document Reviewed: 10/18/2013 Elsevier Interactive Patient Education  2017 [employer].

## 2016-08-22 NOTE — Assessment & Plan Note (Signed)
Increase leafy greens, consider increased lean red meat and using cast iron cookware. Continue to monitor, report any concerns. Recheck CBC today

## 2016-08-22 NOTE — Assessment & Plan Note (Signed)
Tolerating statin, encouraged heart healthy diet, avoid trans fats, minimize simple carbs and saturated fats. Increase exercise as tolerated 

## 2016-08-22 NOTE — Assessment & Plan Note (Signed)
Encouraged DASH diet, decrease po intake and increase exercise as tolerated. Needs 7-8 hours of sleep nightly. Avoid trans fats, eat small, frequent meals every 4-5 hours with lean proteins, complex carbs and healthy fats. Minimize simple carbs, declines bariatric referral 

## 2016-08-22 NOTE — Assessment & Plan Note (Signed)
External, occasionally symptomatic, try [place]

## 2016-08-22 NOTE — Assessment & Plan Note (Signed)
Well controlled, no changes to meds. Encouraged heart healthy diet such as the DASH diet and exercise as tolerated.  °

## 2016-08-22 NOTE — Progress Notes (Signed)
Patient ID: [name], male   DOB: 1945/11/29, 71 y.o.   MRN: [id number]   Subjective:  I acted as a Education administrator for Dr. Charlett Blake. [place], [place]   Patient ID: [name], male    DOB: [birth year], 71 y.o.   MRN: [id number]  Chief Complaint  Patient presents with  . Medicare Wellness  . Annual Exam  . Hypertension  . Hyperlipidemia    Hypertension  This is a recurrent problem. The problem is controlled. Pertinent negatives include no blurred vision, chest pain, headaches, malaise/fatigue, palpitations or shortness of breath.  Hyperlipidemia  This is a recurrent problem. The problem is controlled. Pertinent negatives include no chest pain or shortness of breath.    Patient is in today for follow up on above conditions, anemia and his chronic pain. He notes his back hurts every day and has begun to limit his activity. He has mostly low back pain but he has upper back pain with some anterior chest wall pain, worse with certain movements. No recent fevers but has noted some minor congestion. Denies CP/palp/SOB/HA/fevers/GI or GU c/o. Taking meds as prescribed  Past Medical History:  Diagnosis Date  . Anemia   . Anemia 08/14/2014  . Anemia 03/16/2016  . Anxiety   . Arthritis   . Back pain 10/10/2012  . Fatty liver 2005   seen on 2005 ultrasound,05/2015 CT.   Marland Kitchen Hemorrhoids 08/22/2016  . Hemorrhoids, internal, with bleeding 2005   internal and external. 2005 band ligation (dr Ferdinand Lango in [facility])  . Hyperglycemia 08/21/2013  . Hyperlipidemia   . Hypertension   . Nocturia 08/21/2013  . Obesity (BMI 30.0-34.9)   . Osteoporosis   . Rectal bleeding 07/30/2014  . 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 Unspecified constipation 02/06/2014  . Vitamin D deficiency     Past Surgical History:  Procedure Laterality Date  . COLONOSCOPY  before 2005, 2012, 2013, 08/2014.   Marland Kitchen FRACTURE SURGERY Left 1989   leg  . HEMORRHOID BANDING  2005  . KNEE SURGERY Left 2010   arthroscopy, torn  carilage  . KNEE SURGERY Right 2012   arthroscopy  . TONSILLECTOMY  1957    Family History  Problem Relation Age of Onset  . Stroke Mother   . Aneurysm Mother     brain  . Arthritis Father   . Liver cancer Father     liver, atomic testing in [employer]  . Gallbladder disease Father   . Hyperlipidemia Sister   . Hypertension Sister   . Obesity Sister   . Diabetes Sister   . Diabetes Brother   . Hyperlipidemia Sister   . Hypertension Sister   . Obesity Sister   . Diabetes Sister   . Aneurysm Brother     brain  . COPD Brother   . Gallbladder disease Sister     x4  . Colon cancer Neg Hx   . Colon polyps Neg Hx   . Esophageal cancer Neg Hx   . Rectal cancer Neg Hx   . Stomach cancer Neg Hx     Social History   Social History  . Marital status: Married    Spouse name: N/A  . Number of children: 1  . Years of education: N/A   Occupational History  . Retired [employer]   Social History Main Topics  . Smoking status: Never Smoker  . Smokeless tobacco: Never Used  . Alcohol use 1.2 oz/week    2 Standard drinks or equivalent  per week     Comment: Occassionally  . Drug use: No  . Sexual activity: Yes   Other Topics Concern  . Not on file   Social History Narrative  . No narrative on file    Outpatient Medications Prior to Visit  Medication Sig Dispense Refill  . amLODipine-benazepril (LOTREL) 5-20 MG capsule Take 1 capsule by mouth daily. 90 capsule 2  . azelastine (OPTIVAR) 0.05 % ophthalmic solution Place 1 drop into the left eye 2 (two) times daily. 6 mL 1  . fluticasone (FLONASE) 50 MCG/ACT nasal spray Place 2 sprays into both nostrils daily. 16 g 6  . hydrocortisone (ANUSOL-HC) 25 MG suppository Place 1 suppository (25 mg total) rectally 2 (two) times daily. 12 suppository 3  . hyoscyamine (LEVSIN SL) 0.125 MG SL tablet Place 1 tablet (0.125 mg total) under the tongue every 4 (four) hours as needed. 30 tablet 1  . ibuprofen (ADVIL,MOTRIN) 400 MG  tablet Take 1 tablet (400 mg total) by mouth every 8 (eight) hours as needed. 40 tablet 3  . metoprolol (LOPRESSOR) 50 MG tablet Take 1 tablet (50 mg total) by mouth daily. 90 tablet 2  . Pitavastatin Calcium (LIVALO) 2 MG TABS Take 1 tablet (2 mg total) by mouth daily. 90 tablet 3  . ALPRAZolam (XANAX) 0.5 MG tablet Take 1 tablet (0.5 mg total) by mouth 2 (two) times daily as needed for anxiety. 8 tablet 0  . ciprofloxacin (CIPRO) 500 MG tablet Take 1 tablet (500 mg total) by mouth 2 (two) times daily. 20 tablet 0  . ferrous fumarate (HEMOCYTE - 106 MG FE) 325 (106 Fe) MG TABS tablet Take 1 tablet (106 mg of iron total) by mouth daily. 30 tablet 3  . metroNIDAZOLE (FLAGYL) 500 MG tablet Take 1 tablet (500 mg total) by mouth 2 (two) times daily. 20 tablet 0   No facility-administered medications prior to visit.     No Known Allergies  Review of Systems  Constitutional: Negative for fever and malaise/fatigue.  HENT: Negative for congestion.   Eyes: Negative for blurred vision.  Respiratory: Negative for cough and shortness of breath.   Cardiovascular: Negative for chest pain, palpitations and leg swelling.  Gastrointestinal: Negative for vomiting.  Musculoskeletal: Positive for back pain and joint pain.  Skin: Negative for rash.  Neurological: Negative for loss of consciousness and headaches.  Psychiatric/Behavioral: The patient is nervous/anxious.        Objective:    Physical Exam  Constitutional: He is oriented to person, place, and time. He appears well-developed and well-nourished. No distress.  HENT:  Head: Normocephalic and atraumatic.  Eyes: Conjunctivae are normal.  Neck: Normal range of motion. No thyromegaly present.  Cardiovascular: Normal rate and regular rhythm.   Pulmonary/Chest: Effort normal and breath sounds normal. He has no wheezes.  Abdominal: Soft. Bowel sounds are normal. There is no tenderness.  Musculoskeletal: Normal range of motion. He exhibits no edema  or deformity.  Neurological: He is alert and oriented to person, place, and time.  Skin: Skin is warm and dry. He is not diaphoretic.  Psychiatric: He has a normal mood and affect.    BP 140/80 (BP Location: Right Arm, Patient Position: Sitting, Cuff Size: Normal)   Pulse 74   Ht 5\' 11"  (1.803 m)   Wt 217 lb (98.4 kg)   SpO2 99%   BMI 30.27 kg/m  Wt Readings from Last 3 Encounters:  08/30/16 215 lb (97.5 kg)  08/25/16 217 lb (98.4 kg)  08/22/16 217 lb (98.4 kg)     Lab Results  Component Value Date   WBC 5.2 08/22/2016   HGB 12.6 (L) 08/22/2016   HCT 38.4 (L) 08/22/2016   PLT 208.0 08/22/2016   GLUCOSE 109 (H) 08/22/2016   CHOL 146 08/22/2016   TRIG 90.0 08/22/2016   HDL 43.70 08/22/2016   LDLCALC 85 08/22/2016   ALT 19 08/22/2016   AST 19 08/22/2016   NA 141 08/22/2016   K 4.2 08/22/2016   CL 108 08/22/2016   CREATININE 1.14 08/22/2016   BUN 11 08/22/2016   CO2 28 08/22/2016   TSH 1.04 08/22/2016   PSA 1.08 08/07/2014   HGBA1C 6.0 08/07/2014    Lab Results  Component Value Date   TSH 1.04 08/22/2016   Lab Results  Component Value Date   WBC 5.2 08/22/2016   HGB 12.6 (L) 08/22/2016   HCT 38.4 (L) 08/22/2016   MCV 84.3 08/22/2016   PLT 208.0 08/22/2016   Lab Results  Component Value Date   NA 141 08/22/2016   K 4.2 08/22/2016   CO2 28 08/22/2016   GLUCOSE 109 (H) 08/22/2016   BUN 11 08/22/2016   CREATININE 1.14 08/22/2016   BILITOT 0.7 08/22/2016   ALKPHOS 78 08/22/2016   AST 19 08/22/2016   ALT 19 08/22/2016   PROT 7.4 08/22/2016   ALBUMIN 4.4 08/22/2016   CALCIUM 9.5 08/22/2016   ANIONGAP 8 06/13/2015   GFR 82.03 08/22/2016   Lab Results  Component Value Date   CHOL 146 08/22/2016   Lab Results  Component Value Date   HDL 43.70 08/22/2016   Lab Results  Component Value Date   LDLCALC 85 08/22/2016   Lab Results  Component Value Date   TRIG 90.0 08/22/2016   Lab Results  Component Value Date   CHOLHDL 3 08/22/2016   Lab  Results  Component Value Date   HGBA1C 6.0 08/07/2014       Assessment & Plan:   Problem List Items Addressed This Visit    Hyperlipidemia    Tolerating statin, encouraged heart healthy diet, avoid trans fats, minimize simple carbs and saturated fats. Increase exercise as tolerated      Relevant Orders   Lipid panel (Completed)   Hypertension, essential     Well controlled, no changes to meds. Encouraged heart healthy diet such as the DASH diet and exercise as tolerated.       Relevant Orders   CBC (Completed)   Comprehensive metabolic panel (Completed)   TSH (Completed)   Obesity (BMI 30.0-34.9)    Encouraged DASH diet, decrease po intake and increase exercise as tolerated. Needs 7-8 hours of sleep nightly. Avoid trans fats, eat small, frequent meals every 4-5 hours with lean proteins, complex carbs and healthy fats. Minimize simple carbs, declines bariatric referral      Low back pain    Encouraged moist heat and gentle stretching as tolerated. May try NSAIDs and prescription meds as directed and report if symptoms worsen or seek immediate care, xray confirms significant degenerative changes. Referred to sports med.       Relevant Orders   Ambulatory referral to Sports Medicine   DG Lumbar Spine Complete (Completed)   Anemia    Increase leafy greens, consider increased lean red meat and using cast iron cookware. Continue to monitor, report any concerns. Recheck CBC today      Hemorrhoids    External, occasionally symptomatic, try [place]  Other Visit Diagnoses    Encounter for Medicare annual wellness exam    -  Primary   Left-sided chest wall pain       Relevant Orders   Ambulatory referral to Sports Medicine      I have discontinued Mr. Skidgel ferrous fumarate, ciprofloxacin, and metroNIDAZOLE. I am also having him maintain his fluticasone, azelastine, amLODipine-benazepril, metoprolol, ibuprofen, hydrocortisone, hyoscyamine, Pitavastatin  Calcium, and ALPRAZolam.  Meds ordered this encounter  Medications  . ALPRAZolam (XANAX) 0.5 MG tablet    Sig: Take 1 tablet (0.5 mg total) by mouth 2 (two) times daily as needed for anxiety.    Dispense:  10 tablet    Refill:  2    CMA served as scribe during this visit. History, Physical and Plan performed by medical provider. Documentation and orders reviewed and attested to.  Penni Homans, MD

## 2016-08-25 ENCOUNTER — Ambulatory Visit (INDEPENDENT_AMBULATORY_CARE_PROVIDER_SITE_OTHER): Payer: Medicare Other | Admitting: Family Medicine

## 2016-08-25 ENCOUNTER — Encounter: Payer: Self-pay | Admitting: Family Medicine

## 2016-08-25 DIAGNOSIS — M545 Low back pain, unspecified: Secondary | ICD-10-CM

## 2016-08-25 DIAGNOSIS — G8929 Other chronic pain: Secondary | ICD-10-CM | POA: Diagnosis not present

## 2016-08-25 NOTE — Patient Instructions (Signed)
Your back pain is due to arthritis and lumbar spasms. If you need to take a medicine, first take tylenol for baseline pain relief (1-2 extra strength tabs up to 3x/day) Ibuprofen if needed above this. Stay as active as possible. Do home exercises and stretches as directed - hold each for 20-30 seconds and do each one three times. Consider massage, chiropractor, physical therapy, and/or acupuncture. Physical therapy has been shown to be helpful while the others have mixed results. Strengthening of low back muscles, abdominal musculature are key for long term pain relief. Follow up with me as needed.

## 2016-08-26 NOTE — Progress Notes (Signed)
PCP and consultation requested by: Penni Homans, MD  Subjective:   HPI: Patient is a 71 y.o. male here for low back pain.  Patient reports he's had problems with low back for several years. Pain comes and goes. Feels more with sitting to standing. Pain currently a 1/10 mid-low back with stiffness. No radiation into legs. No numbness or tingling. Occasionally takes ibuprofen which helps some. No bowel/bladder dysfunction.  Past Medical History:  Diagnosis Date  . Anemia   . Anemia 08/14/2014  . Anemia 03/16/2016  . Anxiety   . Arthritis   . Back pain 10/10/2012  . Fatty liver 2005   seen on 2005 ultrasound,05/2015 CT.   Marland Kitchen Hemorrhoids 08/22/2016  . Hemorrhoids, internal, with bleeding 2005   internal and external. 2005 band ligation (dr Ferdinand Lango in [facility])  . Hyperglycemia 08/21/2013  . Hyperlipidemia   . Hypertension   . Nocturia 08/21/2013  . Obesity (BMI 30.0-34.9)   . Osteoporosis   . Rectal bleeding 07/30/2014  . 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 Unspecified constipation 02/06/2014  . Vitamin D deficiency     Current Outpatient Prescriptions on File Prior to Visit  Medication Sig Dispense Refill  . ALPRAZolam (XANAX) 0.5 MG tablet Take 1 tablet (0.5 mg total) by mouth 2 (two) times daily as needed for anxiety. 10 tablet 2  . amLODipine-benazepril (LOTREL) 5-20 MG capsule Take 1 capsule by mouth daily. 90 capsule 2  . azelastine (OPTIVAR) 0.05 % ophthalmic solution Place 1 drop into the left eye 2 (two) times daily. 6 mL 1  . fluticasone (FLONASE) 50 MCG/ACT nasal spray Place 2 sprays into both nostrils daily. 16 g 6  . hydrocortisone (ANUSOL-HC) 25 MG suppository Place 1 suppository (25 mg total) rectally 2 (two) times daily. 12 suppository 3  . hyoscyamine (LEVSIN SL) 0.125 MG SL tablet Place 1 tablet (0.125 mg total) under the tongue every 4 (four) hours as needed. 30 tablet 1  . ibuprofen (ADVIL,MOTRIN) 400 MG tablet Take 1 tablet (400 mg total) by  mouth every 8 (eight) hours as needed. 40 tablet 3  . metoprolol (LOPRESSOR) 50 MG tablet Take 1 tablet (50 mg total) by mouth daily. 90 tablet 2  . Pitavastatin Calcium (LIVALO) 2 MG TABS Take 1 tablet (2 mg total) by mouth daily. 90 tablet 3   No current facility-administered medications on file prior to visit.     Past Surgical History:  Procedure Laterality Date  . COLONOSCOPY  before 2005, 2012, 2013, 08/2014.   Marland Kitchen FRACTURE SURGERY Left 1989   leg  . HEMORRHOID BANDING  2005  . KNEE SURGERY Left 2010   arthroscopy, torn carilage  . KNEE SURGERY Right 2012   arthroscopy  . TONSILLECTOMY  1957    No Known Allergies  Social History   Social History  . Marital status: Married    Spouse name: N/A  . Number of children: 1  . Years of education: N/A   Occupational History  . Retired [employer]   Social History Main Topics  . Smoking status: Never Smoker  . Smokeless tobacco: Never Used  . Alcohol use 1.2 oz/week    2 Standard drinks or equivalent per week     Comment: Occassionally  . Drug use: No  . Sexual activity: Yes   Other Topics Concern  . Not on file   Social History Narrative  . No narrative on file    Family History  Problem Relation Age of  Onset  . Stroke Mother   . Aneurysm Mother     brain  . Arthritis Father   . Liver cancer Father     liver, atomic testing in [employer]  . Gallbladder disease Father   . Hyperlipidemia Sister   . Hypertension Sister   . Obesity Sister   . Diabetes Sister   . Diabetes Brother   . Hyperlipidemia Sister   . Hypertension Sister   . Obesity Sister   . Diabetes Sister   . Aneurysm Brother     brain  . COPD Brother   . Gallbladder disease Sister     x4  . Colon cancer Neg Hx   . Colon polyps Neg Hx   . Esophageal cancer Neg Hx   . Rectal cancer Neg Hx   . Stomach cancer Neg Hx     BP (!) 139/97   Pulse 82   Ht 5\' 11"  (1.803 m)   Wt 217 lb (98.4 kg)   BMI 30.27 kg/m   Review of  Systems: See HPI above.     Objective:  Physical Exam:  Gen: NAD, comfortable in exam room  Back: No gross deformity, scoliosis. TTP mild low lumbar paraspinal regions .  No midline or bony TTP. FROM. Strength LEs 5/5 all muscle groups.   2+ MSRs in patellar and achilles tendons, equal bilaterally. Negative SLRs. Sensation intact to light touch bilaterally. Negative logroll bilateral hips Negative fabers and piriformis stretches.   Assessment & Plan:  1. 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  This is primary cause of his pain based on history and exam.  Some lumbar spasms also.  Encouraged to take tylenol as first line with ibuprofen only if needed.  Shown home exercises and stretches to do daily - he declined formal physical therapy for now.  Consider this in the future.  F/u prn otherwise.

## 2016-08-26 NOTE — Assessment & Plan Note (Signed)
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  This is primary cause of his pain based on history and exam.  Some lumbar spasms also.  Encouraged to take tylenol as first line with ibuprofen only if needed.  Shown home exercises and stretches to do daily - he declined formal physical therapy for now.  Consider this in the future.  F/u prn otherwise.

## 2016-08-30 ENCOUNTER — Emergency Department (HOSPITAL_BASED_OUTPATIENT_CLINIC_OR_DEPARTMENT_OTHER): Payer: Medicare Other

## 2016-08-30 ENCOUNTER — Encounter (HOSPITAL_BASED_OUTPATIENT_CLINIC_OR_DEPARTMENT_OTHER): Payer: Self-pay | Admitting: Emergency Medicine

## 2016-08-30 ENCOUNTER — Emergency Department (HOSPITAL_BASED_OUTPATIENT_CLINIC_OR_DEPARTMENT_OTHER)
Admission: EM | Admit: 2016-08-30 | Discharge: 2016-08-30 | Disposition: A | Discharge status: Home / Self Care | Payer: Medicare Other | Attending: Emergency Medicine | Admitting: Emergency Medicine

## 2016-08-30 DIAGNOSIS — Z79899 Other long term (current) drug therapy: Secondary | ICD-10-CM | POA: Insufficient documentation

## 2016-08-30 DIAGNOSIS — R05 Cough: Secondary | ICD-10-CM | POA: Diagnosis not present

## 2016-08-30 DIAGNOSIS — Z791 Long term (current) use of non-steroidal anti-inflammatories (NSAID): Secondary | ICD-10-CM | POA: Diagnosis not present

## 2016-08-30 DIAGNOSIS — I10 Essential (primary) hypertension: Secondary | ICD-10-CM | POA: Insufficient documentation

## 2016-08-30 DIAGNOSIS — J069 Acute upper respiratory infection, unspecified: Secondary | ICD-10-CM | POA: Diagnosis not present

## 2016-08-30 DIAGNOSIS — B9789 Other viral agents as the cause of diseases classified elsewhere: Secondary | ICD-10-CM

## 2016-08-30 MED ORDER — HYDROCODONE-HOMATROPINE 5-1.5 MG/5ML PO SYRP: 5.0000 mL | ORAL_SOLUTION | Freq: Four times a day (QID) | ORAL | 0 refills | Status: DC | PRN

## 2016-08-30 NOTE — ED Provider Notes (Signed)
[facility] DEPT [facility] Provider Note   CSN: [id number] Arrival date & time: 08/30/16  2046   By signing my name below, I, Delton Prairie, attest that this documentation has been prepared under the direction and in the presence of Blanchie Dessert, MD  Electronically Signed: Delton Prairie, ED Scribe. 08/30/16. 10:22 PM.   History   Chief Complaint Chief Complaint  Patient presents with  . Cough   The history is provided by the patient. No language interpreter was used.   HPI Comments:  [name] is a 71 y.o. male, with a hx of HTN and hyperlipidemia, who presents to the Emergency Department complaining of acute onset, persistent, intermittent cough onset 2 days. Pt also reports congestion, rhinorrhea, resolved fevers and rib pain secondary to coughing. No alleviating factors noted. Pt denies nausea, vomiting, diarrhea, SOB, a hx of asthma or any other associated symptoms. Pt is a non-smoker. No other complaints noted.   Past Medical History:  Diagnosis Date  . Anemia   . Anemia 08/14/2014  . Anemia 03/16/2016  . Anxiety   . Arthritis   . Back pain 10/10/2012  . Fatty liver 2005   seen on 2005 ultrasound,05/2015 CT.   Marland Kitchen Hemorrhoids 08/22/2016  . Hemorrhoids, internal, with bleeding 2005   internal and external. 2005 band ligation (dr Ferdinand Lango in [facility])  . Hyperglycemia 08/21/2013  . Hyperlipidemia   . Hypertension   . Nocturia 08/21/2013  . Obesity (BMI 30.0-34.9)   . Osteoporosis   . Rectal bleeding 07/30/2014  . 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 Unspecified constipation 02/06/2014  . Vitamin D deficiency     Patient Active Problem List   Diagnosis Date Noted  . Hemorrhoids 08/22/2016  . Anemia 03/16/2016  . Right shoulder pain 02/26/2016  . SBO (small bowel obstruction)   . Abnormal CT scan, gastrointestinal tract   . Hypokalemia 06/10/2015  . Loose stools 06/07/2015  . Generalized abdominal pain   . Low back pain 10/10/2012  . Hyperlipidemia    . Hypertension, essential    . History of colon polyps   . Obesity (BMI 30.0-34.9)     Past Surgical History:  Procedure Laterality Date  . COLONOSCOPY  before 2005, 2012, 2013, 08/2014.   Marland Kitchen FRACTURE SURGERY Left 1989   leg  . HEMORRHOID BANDING  2005  . KNEE SURGERY Left 2010   arthroscopy, torn carilage  . KNEE SURGERY Right 2012   arthroscopy  . TONSILLECTOMY  1957    Home Medications    Prior to Admission medications   Medication Sig Start Date End Date Taking? Authorizing Provider  ALPRAZolam Duanne Moron) 0.5 MG tablet Take 1 tablet (0.5 mg total) by mouth 2 (two) times daily as needed for anxiety. 08/22/16  Yes Mosie Lukes, MD  amLODipine-benazepril (LOTREL) 5-20 MG capsule Take 1 capsule by mouth daily. 02/19/16  Yes Mosie Lukes, MD  metoprolol (LOPRESSOR) 50 MG tablet Take 1 tablet (50 mg total) by mouth daily. 02/19/16  Yes Mosie Lukes, MD  Pitavastatin Calcium (LIVALO) 2 MG TABS Take 1 tablet (2 mg total) by mouth daily. 03/17/16  Yes Mosie Lukes, MD  azelastine (OPTIVAR) 0.05 % ophthalmic solution Place 1 drop into the left eye 2 (two) times daily. 02/19/16   Mosie Lukes, MD  fluticasone (FLONASE) 50 MCG/ACT nasal spray Place 2 sprays into both nostrils daily. 08/09/15   Alferd Apa Lowne Chase, DO  hydrocortisone (ANUSOL-HC) 25 MG suppository Place 1 suppository (25  mg total) rectally 2 (two) times daily. 03/11/16   Mosie Lukes, MD  hyoscyamine (LEVSIN SL) 0.125 MG SL tablet Place 1 tablet (0.125 mg total) under the tongue every 4 (four) hours as needed. 03/11/16   Mosie Lukes, MD  ibuprofen (ADVIL,MOTRIN) 400 MG tablet Take 1 tablet (400 mg total) by mouth every 8 (eight) hours as needed. 02/19/16   Mosie Lukes, MD    Family History Family History  Problem Relation Age of Onset  . Stroke Mother   . Aneurysm Mother     brain  . Arthritis Father   . Liver cancer Father     liver, atomic testing in [employer]  . Gallbladder disease Father   . Hyperlipidemia  Sister   . Hypertension Sister   . Obesity Sister   . Diabetes Sister   . Diabetes Brother   . Hyperlipidemia Sister   . Hypertension Sister   . Obesity Sister   . Diabetes Sister   . Aneurysm Brother     brain  . COPD Brother   . Gallbladder disease Sister     x4  . Colon cancer Neg Hx   . Colon polyps Neg Hx   . Esophageal cancer Neg Hx   . Rectal cancer Neg Hx   . Stomach cancer Neg Hx     Social History Social History  Substance Use Topics  . Smoking status: Never Smoker  . Smokeless tobacco: Never Used  . Alcohol use 1.2 oz/week    2 Standard drinks or equivalent per week     Comment: Occassionally     Allergies   Patient has no known allergies.   Review of Systems Review of Systems  HENT: Positive for congestion and rhinorrhea.   Respiratory: Positive for cough. Negative for shortness of breath.   Gastrointestinal: Negative for diarrhea, nausea and vomiting.  Musculoskeletal: Positive for myalgias.  All other systems reviewed and are negative.  Physical Exam Updated Vital Signs BP 135/83 (BP Location: Right Arm)   Pulse 77   Temp 99.5 F (37.5 C) (Oral)   Resp 24   Ht 5\' 11"  (1.803 m)   Wt 215 lb (97.5 kg)   SpO2 100%   BMI 29.99 kg/m   Physical Exam  Constitutional: He is oriented to person, place, and time. He appears well-developed and well-nourished.  HENT:  Head: Normocephalic and atraumatic.  Mouth/Throat: Posterior oropharyngeal erythema (minimal) present.  Swollen nasal turbinates   Eyes: EOM are normal.  Neck: Normal range of motion.  Cardiovascular: Normal rate, regular rhythm, normal heart sounds and intact distal pulses.   Pulmonary/Chest: Effort normal and breath sounds normal. No respiratory distress.  Abdominal: Soft. He exhibits no distension. There is no tenderness.  Musculoskeletal: Normal range of motion.  Neurological: He is alert and oriented to person, place, and time.  Skin: Skin is warm and dry.  Psychiatric: He has  a normal mood and affect. Judgment normal.  Nursing note and vitals reviewed.  ED Treatments / Results  DIAGNOSTIC STUDIES:  Oxygen Saturation is 100% on RA, normal by my interpretation.    COORDINATION OF CARE:  10:14 PM Discussed treatment plan with pt at bedside and pt agreed to plan.  Labs (all labs ordered are listed, but only abnormal results are displayed) Labs Reviewed - No data to display  EKG  EKG Interpretation  Date/Time:  Saturday August 30 2016 20:58:51 EST Ventricular Rate:  78 PR Interval:  140 QRS Duration: 78 QT Interval:  356 QTC Calculation: 405 R Axis:   56 Text Interpretation:  Normal sinus rhythm No significant change since last tracing Confirmed by [facility]  MD, Loree Fee ([id number]) on 08/30/2016 9:56:12 PM       Radiology Dg Chest 2 View  Result Date: 08/30/2016 CLINICAL DATA:  Cough, sore throat and fever. EXAM: CHEST  2 VIEW COMPARISON:  12/22/2015 FINDINGS: The cardiomediastinal contours are normal. The lungs are clear. Pulmonary vasculature is normal. No consolidation, pleural effusion, or pneumothorax. No acute osseous abnormalities are seen. 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 IMPRESSION: No acute abnormality. Electronically Signed   By: Jeb Levering M.D.   On: 08/30/2016 21:48    Procedures Procedures (including critical care time)  Medications Ordered in ED Medications - No data to display   Initial Impression / Assessment and Plan / ED Course  I have reviewed the triage vital signs and the nursing notes.  Pertinent labs & imaging results that were available during my care of the patient were reviewed by me and considered in my medical decision making (see chart for details).     Pt with symptoms consistent with viral URI.  Well appearing here.  No signs of breathing difficulty  No signs of pharyngitis, otitis or abnormal abdominal findings.   CXR wnl and pt to return with any further problems.   Final Clinical  Impressions(s) / ED Diagnoses   Final diagnoses:  Viral URI with cough    New Prescriptions Discharge Medication List as of 08/30/2016 10:39 PM    START taking these medications   Details  HYDROcodone-homatropine (HYCODAN) 5-1.5 MG/5ML syrup Take 5 mLs by mouth every 6 (six) hours as needed for cough., Starting Sat 08/30/2016, Print       I personally performed the services described in this documentation, which was scribed in my presence.  The recorded information has been reviewed and considered.    Blanchie Dessert, MD 08/31/16 [phone]

## 2016-08-30 NOTE — ED Notes (Signed)
Alert, NAD, calm, interactive, resps e/u, speaking in clear complete sentences, no dyspnea noted, skin W&D, VSS, c/o dry cough, fever yesterday (resolved), sore throat from cough, and HA, mentions some pain behind L eye, (denies: sob, nvd, current fever, dizziness, ear issues, or visual changes). Family at [facility]. No sinus tenderness noted. No relief with thera-flu.

## 2016-08-30 NOTE — ED Triage Notes (Signed)
Cough and congestion with bil rib pain with cough x 3 days , denies SOB

## 2016-08-31 NOTE — Assessment & Plan Note (Signed)
Encouraged moist heat and gentle stretching as tolerated. May try NSAIDs and prescription meds as directed and report if symptoms worsen or seek immediate care, xray confirms significant degenerative changes. Referred to sports med.

## 2016-09-02 ENCOUNTER — Other Ambulatory Visit: Payer: Self-pay | Admitting: Family Medicine

## 2016-09-02 DIAGNOSIS — J209 Acute bronchitis, unspecified: Secondary | ICD-10-CM

## 2016-09-02 MED FILL — FLUTICASONE PROP 50 MCG SPR: 50 | 30 days supply | Qty: 16 | Fill #0

## 2016-10-17 MED FILL — IBUPROFEN 400 MG TABLET: 400 | 14 days supply | Qty: 40 | Fill #2

## 2016-11-07 ENCOUNTER — Other Ambulatory Visit: Payer: Self-pay | Admitting: Family Medicine

## 2016-11-07 MED ORDER — IBUPROFEN 400 MG PO TABS: 400.0000 mg | ORAL_TABLET | Freq: Three times a day (TID) | ORAL | 3 refills | Status: DC | PRN

## 2016-11-10 DIAGNOSIS — R351 Nocturia: Secondary | ICD-10-CM | POA: Diagnosis not present

## 2016-11-10 DIAGNOSIS — R350 Frequency of micturition: Secondary | ICD-10-CM | POA: Diagnosis not present

## 2016-11-10 DIAGNOSIS — N401 Enlarged prostate with lower urinary tract symptoms: Secondary | ICD-10-CM | POA: Diagnosis not present

## 2016-11-15 ENCOUNTER — Other Ambulatory Visit: Payer: Self-pay | Admitting: Family Medicine

## 2016-12-10 MED FILL — ANUCORT-HC 25 MG SUPPOSITOR: 25 | 6 days supply | Qty: 12 | Fill #1

## 2017-01-17 ENCOUNTER — Other Ambulatory Visit: Payer: Self-pay | Admitting: Family Medicine

## 2017-01-23 ENCOUNTER — Encounter (HOSPITAL_BASED_OUTPATIENT_CLINIC_OR_DEPARTMENT_OTHER): Payer: Self-pay

## 2017-01-23 ENCOUNTER — Emergency Department (HOSPITAL_BASED_OUTPATIENT_CLINIC_OR_DEPARTMENT_OTHER): Payer: Medicare Other

## 2017-01-23 ENCOUNTER — Emergency Department (HOSPITAL_BASED_OUTPATIENT_CLINIC_OR_DEPARTMENT_OTHER)
Admission: EM | Admit: 2017-01-23 | Discharge: 2017-01-24 | Disposition: A | Discharge status: Home / Self Care | Payer: Medicare Other | Attending: Emergency Medicine | Admitting: Emergency Medicine

## 2017-01-23 DIAGNOSIS — R252 Cramp and spasm: Secondary | ICD-10-CM | POA: Insufficient documentation

## 2017-01-23 DIAGNOSIS — R0789 Other chest pain: Secondary | ICD-10-CM | POA: Diagnosis not present

## 2017-01-23 DIAGNOSIS — R110 Nausea: Secondary | ICD-10-CM | POA: Insufficient documentation

## 2017-01-23 DIAGNOSIS — R109 Unspecified abdominal pain: Secondary | ICD-10-CM | POA: Diagnosis not present

## 2017-01-23 DIAGNOSIS — R079 Chest pain, unspecified: Secondary | ICD-10-CM | POA: Diagnosis not present

## 2017-01-23 DIAGNOSIS — Z79899 Other long term (current) drug therapy: Secondary | ICD-10-CM | POA: Diagnosis not present

## 2017-01-23 DIAGNOSIS — I10 Essential (primary) hypertension: Secondary | ICD-10-CM | POA: Diagnosis not present

## 2017-01-23 LAB — CBC WITH DIFFERENTIAL/PLATELET
Basophils Absolute: 0 10*3/uL (ref 0.0–0.1)
Basophils Relative: 0 %
Eosinophils Absolute: 0.1 10*3/uL (ref 0.0–0.7)
Eosinophils Relative: 1 %
HCT: 36.1 % — ABNORMAL LOW (ref 39.0–52.0)
Hemoglobin: 12 g/dL — ABNORMAL LOW (ref 13.0–17.0)
Lymphocytes Relative: 23 %
Lymphs Abs: 2.4 10*3/uL (ref 0.7–4.0)
MCH: 27 pg (ref 26.0–34.0)
MCHC: 33.2 g/dL (ref 30.0–36.0)
MCV: 81.1 fL (ref 78.0–100.0)
Monocytes Absolute: 0.8 10*3/uL (ref 0.1–1.0)
Monocytes Relative: 8 %
Neutro Abs: 6.8 10*3/uL (ref 1.7–7.7)
Neutrophils Relative %: 68 %
Platelets: 216 10*3/uL (ref 150–400)
RBC: 4.45 MIL/uL (ref 4.22–5.81)
RDW: 15.5 % (ref 11.5–15.5)
WBC: 10.1 10*3/uL (ref 4.0–10.5)

## 2017-01-23 LAB — TROPONIN I
Troponin I: <0.03 ng/mL (ref <0.03)
Troponin I: <0.03 ng/mL (ref <0.03)

## 2017-01-23 LAB — COMPREHENSIVE METABOLIC PANEL
ALT: 26 U/L (ref 17–63)
AST: 31 U/L (ref 15–41)
Albumin: 4.3 g/dL (ref 3.5–5.0)
Alkaline Phosphatase: 78 U/L (ref 38–126)
Anion gap: 11 (ref 5–15)
BUN: 20 mg/dL (ref 6–20)
CO2: 24 mmol/L (ref 22–32)
Calcium: 9.2 mg/dL (ref 8.9–10.3)
Chloride: 101 mmol/L (ref 101–111)
Creatinine, Ser: 1.88 mg/dL — ABNORMAL HIGH (ref 0.61–1.24)
GFR calc Af Amer: 41 mL/min — ABNORMAL LOW (ref >60)
GFR calc non Af Amer: 35 mL/min — ABNORMAL LOW (ref >60)
Glucose, Bld: 141 mg/dL — ABNORMAL HIGH (ref 65–99)
Potassium: 3.9 mmol/L (ref 3.5–5.1)
Sodium: 136 mmol/L (ref 135–145)
Total Bilirubin: 0.6 mg/dL (ref 0.3–1.2)
Total Protein: 7.9 g/dL (ref 6.5–8.1)

## 2017-01-23 LAB — LIPASE, BLOOD: Lipase: 32 U/L (ref 11–51)

## 2017-01-23 MED ORDER — OMEPRAZOLE 20 MG PO CPDR: 20.0000 mg | DELAYED_RELEASE_CAPSULE | Freq: Every day | ORAL | 0 refills | Status: DC

## 2017-01-23 MED ORDER — HYOSCYAMINE SULFATE 0.125 MG SL SUBL: 0.1250 mg | SUBLINGUAL_TABLET | SUBLINGUAL | 1 refills | Status: DC | PRN

## 2017-01-23 NOTE — ED Provider Notes (Signed)
[facility] DEPT M[facility] Provider Note   CSN: [id number] Arrival date & time: 01/23/17  2024     History   Chief Complaint Chief Complaint  Patient presents with  . Chest Pain    HPI [name] is a 71 y.o. male.  HPI Pt started having pain in his chest around 3pm.  Earlier in the day he had played golf and had some muscle cramps in his legs.  Initially he thought it was from the muscle cramping. The pain in his chest got worse after dinner.  He then started having bloating and cramping in his abdomen.  No vomiting although he did feel nauseated.  He also felt a little sweaty earlier.    No hx of heart disease or blood clots.     Past Medical History:  Diagnosis Date  . Anemia   . Anemia 08/14/2014  . Anemia 03/16/2016  . Anxiety   . Arthritis   . Back pain 10/10/2012  . Fatty liver 2005   seen on 2005 ultrasound,05/2015 CT.   Marland Kitchen Hemorrhoids 08/22/2016  . Hemorrhoids, internal, with bleeding 2005   internal and external. 2005 band ligation (dr Ferdinand Lango in [facility])  . Hyperglycemia 08/21/2013  . Hyperlipidemia   . Hypertension   . Nocturia 08/21/2013  . Obesity (BMI 30.0-34.9)   . Osteoporosis   . Rectal bleeding 07/30/2014  . 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 Unspecified constipation 02/06/2014  . Vitamin D deficiency       Past Surgical History:  Procedure Laterality Date  . COLONOSCOPY  before 2005, 2012, 2013, 08/2014.   Marland Kitchen FRACTURE SURGERY Left 1989   leg  . HEMORRHOID BANDING  2005  . KNEE SURGERY Left 2010   arthroscopy, torn carilage  . KNEE SURGERY Right 2012   arthroscopy  . TONSILLECTOMY  1957       Home Medications    Prior to Admission medications   Medication Sig Start Date End Date Taking? Authorizing Provider  ALPRAZolam Duanne Moron) 0.5 MG tablet Take 1 tablet (0.5 mg total) by mouth 2 (two) times daily as needed for anxiety. 08/22/16   Mosie Lukes, MD  amLODipine-benazepril (LOTREL) 5-20 MG capsule TAKE 1 CAPSULE  DAILY (NEED APPOINTMENT FOR FURTHER REFILLS) 01/20/17   Mosie Lukes, MD  azelastine (OPTIVAR) 0.05 % ophthalmic solution Place 1 drop into the left eye 2 (two) times daily. 02/19/16   Mosie Lukes, MD  fluticasone (FLONASE) 50 MCG/ACT nasal spray PLACE 2 SPRAYS INTO BOTH NOSTRILS DAILY. 09/02/16   Mosie Lukes, MD  HYDROcodone-homatropine [facility]) 5-1.5 MG/5ML syrup Take 5 mLs by mouth every 6 (six) hours as needed for cough. 08/30/16   Blanchie Dessert, MD  hydrocortisone (ANUSOL-HC) 25 MG suppository Place 1 suppository (25 mg total) rectally 2 (two) times daily. 03/11/16   Mosie Lukes, MD  hyoscyamine (LEVSIN SL) 0.125 MG SL tablet Place 1 tablet (0.125 mg total) under the tongue every 4 (four) hours as needed. 01/23/17   Dorie Rank, MD  ibuprofen (ADVIL,MOTRIN) 400 MG tablet Take 1 tablet (400 mg total) by mouth every 8 (eight) hours as needed. 11/07/16   Mosie Lukes, MD  metoprolol tartrate (LOPRESSOR) 50 MG tablet TAKE 1 TABLET DAILY (NEED APPOINTMENT FOR FURTHER REFILLS) 11/17/16   Mosie Lukes, MD  omeprazole (PRILOSEC) 20 MG capsule Take 1 capsule (20 mg total) by mouth daily. 01/23/17   Dorie Rank, MD  Pitavastatin Calcium (LIVALO) 2 MG TABS Take 1 tablet (  2 mg total) by mouth daily. 03/17/16   Mosie Lukes, MD    Family History Family History  Problem Relation Age of Onset  . Stroke Mother   . Aneurysm Mother        brain  . Arthritis Father   . Liver cancer Father        liver, atomic testing in [employer]  . Gallbladder disease Father   . Hyperlipidemia Sister   . Hypertension Sister   . Obesity Sister   . Diabetes Sister   . Diabetes Brother   . Hyperlipidemia Sister   . Hypertension Sister   . Obesity Sister   . Diabetes Sister   . Aneurysm Brother        brain  . COPD Brother   . Gallbladder disease Sister        x4  . Colon cancer Neg Hx   . Colon polyps Neg Hx   . Esophageal cancer Neg Hx   . Rectal cancer Neg Hx   . Stomach cancer Neg Hx      Social History Social History  Substance Use Topics  . Smoking status: Never Smoker  . Smokeless tobacco: Never Used  . Alcohol use 1.2 oz/week    2 Standard drinks or equivalent per week     Comment: Occassionally     Allergies   Patient has no known allergies.   Review of Systems Review of Systems  All other systems reviewed and are negative.    Physical Exam Updated Vital Signs BP 123/77   Pulse 78   Temp 98.2 F (36.8 C) (Oral)   Resp 15   Ht 1.778 m (5\' 10" )   Wt 96.2 kg (212 lb)   SpO2 96%   BMI 30.42 kg/m   Physical Exam   ED Treatments / Results  Labs (all labs ordered are listed, but only abnormal results are displayed) Labs Reviewed  CBC WITH DIFFERENTIAL/PLATELET - Abnormal; Notable for the following:       Result Value   Hemoglobin 12.0 (*)    HCT 36.1 (*)    All other components within normal limits  COMPREHENSIVE METABOLIC PANEL - Abnormal; Notable for the following:    Glucose, Bld 141 (*)    Creatinine, Ser 1.88 (*)    GFR calc non Af Amer 35 (*)    GFR calc Af Amer 41 (*)    All other components within normal limits  TROPONIN I  LIPASE, BLOOD  TROPONIN I    EKG  EKG Interpretation  Date/Time:  Friday January 23 2017 20:28:56 EDT Ventricular Rate:  81 PR Interval:  134 QRS Duration: 76 QT Interval:  358 QTC Calculation: 415 R Axis:   31 Text Interpretation:  Normal sinus rhythm Nonspecific T wave abnormality Abnormal ECG No significant change since last tracing Confirmed by Dorie Rank [phone]) on 01/23/2017 8:35:31 PM       Radiology Dg Chest 2 View  Result Date: 01/23/2017 CLINICAL DATA:  Chest pain. EXAM: CHEST  2 VIEW COMPARISON:  Radiographs of August 30, 2016. FINDINGS: The heart size and mediastinal contours are within normal limits. Rounded density seen in left lung base which may represent overlying nipple shadow. No acute pulmonary disease is noted. No pneumothorax or pleural effusion is noted. The visualized skeletal  structures are unremarkable. IMPRESSION: Rounded nodular density seen in left lower lobe most consistent with overlying nipple shadow; repeat radiograph with nipple markers is recommended to rule out pulmonary nodule or mass. No  other abnormality seen Electronically Signed   By: Marijo Conception, M.D.   On: 01/23/2017 21:01    Procedures Procedures (including critical care time)  Medications Ordered in ED Medications - No data to display   Initial Impression / Assessment and Plan / ED Course  I have reviewed the triage vital signs and the nursing notes.  Pertinent labs & imaging results that were available during my care of the patient were reviewed by me and considered in my medical decision making (see chart for details).    Sx atypical for heart disease.  Pt was also experiencing abdominal cramping.  Low risk for heart disease.  Heart score of 3.  No dyspnea.  Doubt PE.  Sx not severe, doubt dissection or aneurysm. Sx may be related to his abdominal cramping issues.  Will dc home with antispasmodic and antacids.  Follow up with PCP  Final Clinical Impressions(s) / ED Diagnoses   Final diagnoses:  Chest pain, unspecified type  Abdominal pain, unspecified abdominal location    New Prescriptions New Prescriptions   OMEPRAZOLE (PRILOSEC) 20 MG CAPSULE    Take 1 capsule (20 mg total) by mouth daily.  Darol Destine, MD 01/24/17 Dyann Kief

## 2017-01-23 NOTE — ED Triage Notes (Signed)
Pt c/o left side chest pain for the last 20 minutes with an episode earlier today at 1500, chest pain associated with nausea, diaphoresis, radiation to left arm

## 2017-01-24 NOTE — ED Notes (Signed)
Pt verbalizes understanding of d/c instructions and denies any further needs at this time. 

## 2017-01-26 MED FILL — OSCIMIN SL 0.125 MG TABLET: 0.125 | 5 days supply | Qty: 30 | Fill #0

## 2017-01-26 MED FILL — OMEPRAZOLE DR 20 MG CAPSULE: 20 | 14 days supply | Qty: 14 | Fill #0

## 2017-04-07 ENCOUNTER — Ambulatory Visit (INDEPENDENT_AMBULATORY_CARE_PROVIDER_SITE_OTHER): Payer: Medicare Other | Admitting: Behavioral Health

## 2017-04-07 DIAGNOSIS — Z23 Encounter for immunization: Secondary | ICD-10-CM

## 2017-04-07 NOTE — Progress Notes (Signed)
Pre visit review using our clinic review tool, if applicable. No additional management support is needed unless otherwise documented below in the visit note.  Patient came in office today for influenza vaccination. IM injection was given in the left deltoid. Patient tolerated the injection well. No signs or symptoms of a reaction prior to patient leaving the nurse visit.

## 2017-04-14 ENCOUNTER — Other Ambulatory Visit: Payer: Self-pay | Admitting: Family Medicine

## 2017-05-19 ENCOUNTER — Telehealth: Payer: Self-pay | Admitting: Family Medicine

## 2017-05-19 ENCOUNTER — Other Ambulatory Visit: Payer: Self-pay | Admitting: Family Medicine

## 2017-05-19 DIAGNOSIS — M25519 Pain in unspecified shoulder: Secondary | ICD-10-CM

## 2017-05-19 DIAGNOSIS — M545 Low back pain: Secondary | ICD-10-CM

## 2017-05-19 NOTE — Telephone Encounter (Signed)
Copied from [place] [id number]. Topic: Referral - Request >> May 19, 2017 12:56 PM Wynetta Emery, [place] C wrote: Reason for CRM: pt is requesting a referral to pain management.   Please assist further.

## 2017-07-03 ENCOUNTER — Other Ambulatory Visit: Payer: Self-pay | Admitting: Family Medicine

## 2017-07-03 MED FILL — HEMMOREX-HC 25 MG SUPP: 25 | 6 days supply | Qty: 12 | Fill #0

## 2017-07-21 ENCOUNTER — Telehealth: Payer: Self-pay | Admitting: Family Medicine

## 2017-07-21 MED ORDER — ALPRAZOLAM 0.5 MG PO TABS: 0.5000 mg | ORAL_TABLET | Freq: Two times a day (BID) | ORAL | 2 refills | Status: DC | PRN

## 2017-07-21 NOTE — Telephone Encounter (Signed)
Copied from [place]. Topic: Quick Communication - See Telephone Encounter >> Jul 21, 2017 11:37 AM Rosalin Hawking wrote: CRM for notification. See Telephone encounter for:  07/21/17.   Pt came in office stating called since Thursday 07-16-2017 to get a refill on ALPRAZolam (XANAX) 0.5 MG tablet. Pt went to the pharmacy and the pharmacy mentioned did not received any rx for Xanax and sent to pt to come to our office. Also pt mentioned that pharmacist sent another request threw fax today. Pt would like to have is rx refill ASAP. Please advise.

## 2017-07-21 NOTE — Telephone Encounter (Signed)
I have not sen him in nearly a year. He can have #10without refills til seen

## 2017-07-21 NOTE — Telephone Encounter (Signed)
Requesting:xanax Contract:no UDS:no Last OV:08/22/16 Next OV:Not scheduled  Last Refill:08/22/16 #[id number]   Please advise  '

## 2017-07-22 ENCOUNTER — Other Ambulatory Visit: Payer: Self-pay

## 2017-07-22 MED ORDER — ALPRAZOLAM 0.5 MG PO TABS: 0.5000 mg | ORAL_TABLET | Freq: Two times a day (BID) | ORAL | 0 refills | Status: DC | PRN

## 2017-07-23 NOTE — Telephone Encounter (Signed)
Pt came in office and set up an appt for tomorrow.

## 2017-07-24 ENCOUNTER — Ambulatory Visit (HOSPITAL_BASED_OUTPATIENT_CLINIC_OR_DEPARTMENT_OTHER)
Admission: RE | Admit: 2017-07-24 | Discharge: 2017-07-24 | Disposition: A | Discharge status: Home / Self Care | Payer: Medicare Other | Source: Ambulatory Visit | Attending: Family Medicine | Admitting: Family Medicine

## 2017-07-24 ENCOUNTER — Ambulatory Visit (INDEPENDENT_AMBULATORY_CARE_PROVIDER_SITE_OTHER): Payer: Medicare Other | Admitting: Family Medicine

## 2017-07-24 VITALS — BP 120/80 | HR 80 | Temp 98.1°F | Resp 18 | Wt 218.4 lb

## 2017-07-24 DIAGNOSIS — E785 Hyperlipidemia, unspecified: Secondary | ICD-10-CM | POA: Diagnosis not present

## 2017-07-24 DIAGNOSIS — D649 Anemia, unspecified: Secondary | ICD-10-CM | POA: Diagnosis not present

## 2017-07-24 DIAGNOSIS — R109 Unspecified abdominal pain: Secondary | ICD-10-CM

## 2017-07-24 DIAGNOSIS — R933 Abnormal findings on diagnostic imaging of other parts of digestive tract: Secondary | ICD-10-CM | POA: Diagnosis not present

## 2017-07-24 DIAGNOSIS — Z79899 Other long term (current) drug therapy: Secondary | ICD-10-CM

## 2017-07-24 DIAGNOSIS — I10 Essential (primary) hypertension: Secondary | ICD-10-CM | POA: Diagnosis not present

## 2017-07-24 DIAGNOSIS — R1084 Generalized abdominal pain: Secondary | ICD-10-CM

## 2017-07-24 LAB — CBC WITH DIFFERENTIAL/PLATELET
Basophils Absolute: 0 10*3/uL (ref 0.0–0.1)
Basophils Relative: 0.5 % (ref 0.0–3.0)
Eosinophils Absolute: 0.3 10*3/uL (ref 0.0–0.7)
Eosinophils Relative: 7.8 % — ABNORMAL HIGH (ref 0.0–5.0)
HCT: 39.4 % (ref 39.0–52.0)
Hemoglobin: 13.1 g/dL (ref 13.0–17.0)
Lymphocytes Relative: 42.5 % (ref 12.0–46.0)
Lymphs Abs: 1.8 10*3/uL (ref 0.7–4.0)
MCHC: 33.2 g/dL (ref 30.0–36.0)
MCV: 84.8 fl (ref 78.0–100.0)
Monocytes Absolute: 0.4 10*3/uL (ref 0.1–1.0)
Monocytes Relative: 9.4 % (ref 3.0–12.0)
Neutro Abs: 1.7 10*3/uL (ref 1.4–7.7)
Neutrophils Relative %: 39.8 % — ABNORMAL LOW (ref 43.0–77.0)
Platelets: 211 10*3/uL (ref 150.0–400.0)
RBC: 4.65 Mil/uL (ref 4.22–5.81)
RDW: 13.4 % (ref 11.5–15.5)
WBC: 4.3 10*3/uL (ref 4.0–10.5)

## 2017-07-24 LAB — LIPID PANEL
Cholesterol: 140 mg/dL (ref 0–200)
HDL: 35.8 mg/dL — ABNORMAL LOW (ref >39.00)
LDL Cholesterol: 89 mg/dL (ref 0–99)
NonHDL: 103.7
Total CHOL/HDL Ratio: 4
Triglycerides: 73 mg/dL (ref 0.0–149.0)
VLDL: 14.6 mg/dL (ref 0.0–40.0)

## 2017-07-24 LAB — COMPREHENSIVE METABOLIC PANEL
ALT: 19 U/L (ref 0–53)
AST: 18 U/L (ref 0–37)
Albumin: 4 g/dL (ref 3.5–5.2)
Alkaline Phosphatase: 75 U/L (ref 39–117)
BUN: 12 mg/dL (ref 6–23)
CO2: 28 mEq/L (ref 19–32)
Calcium: 9.1 mg/dL (ref 8.4–10.5)
Chloride: 106 mEq/L (ref 96–112)
Creatinine, Ser: 1.06 mg/dL (ref 0.40–1.50)
GFR: 88.98 mL/min (ref >60.00)
Glucose, Bld: 102 mg/dL — ABNORMAL HIGH (ref 70–99)
Potassium: 4.2 mEq/L (ref 3.5–5.1)
Sodium: 140 mEq/L (ref 135–145)
Total Bilirubin: 0.5 mg/dL (ref 0.2–1.2)
Total Protein: 7.1 g/dL (ref 6.0–8.3)

## 2017-07-24 LAB — TSH: TSH: 0.8 u[IU]/mL (ref 0.35–4.50)

## 2017-07-24 LAB — FERRITIN: Ferritin: 13.9 ng/mL — ABNORMAL LOW (ref 22.0–322.0)

## 2017-07-24 MED ORDER — AMLODIPINE BESY-BENAZEPRIL HCL 5-20 MG PO CAPS: ORAL_CAPSULE | ORAL | 2 refills | Status: DC

## 2017-07-24 MED ORDER — METOPROLOL TARTRATE 50 MG PO TABS: ORAL_TABLET | ORAL | 2 refills | Status: DC

## 2017-07-24 MED ORDER — HYOSCYAMINE SULFATE 0.125 MG SL SUBL: 0.1250 mg | SUBLINGUAL_TABLET | SUBLINGUAL | 1 refills | Status: DC | PRN

## 2017-07-24 MED FILL — OSCIMIN SL 0.125 MG TABLET: 0.125 | 5 days supply | Qty: 30 | Fill #0

## 2017-07-24 MED FILL — ALPRAZolam 0.5 MG TABS: 0.5 | 5 days supply | Qty: 10 | Fill #0

## 2017-07-24 NOTE — Assessment & Plan Note (Signed)
Increase leafy greens, consider increased lean red meat and using cast iron cookware. Continue to monitor, report any concerns. Check cbc today 

## 2017-07-24 NOTE — Assessment & Plan Note (Addendum)
With increased abdominal pain and a history of bowel obstruction, will repeat CT scan

## 2017-07-24 NOTE — Assessment & Plan Note (Signed)
Encouraged heart healthy diet, increase exercise, avoid trans fats, consider a krill oil cap daily 

## 2017-07-24 NOTE — Progress Notes (Signed)
Subjective:  I acted as a Education administrator for Dr. Charlett Blake. [name], [place]  Patient ID: [name], male    DOB: February 09, 1946, 72 y.o.   MRN: [id number]  No chief complaint on file.   HPI  Patient is in today for a medication follow up. Patient c/o low back pain. No recent febrile illness or acute hospitalizations. Denies CP/palp/SOB/HA/congestion/fevers/GI or GU c/o. Taking meds as prescribed.he has not been in in nearly a year but has been having intermittent low back pain without incontinence or radiculopathy. His loow back pain is improved with his chiropractic and acupuncture treatments. His abdominal pain is cramping in the LLQ at times which responds to hyoscyamine but he also has some b/l upper flank pain, really over his ribs that occurs intermittently the last few months. No falls or trauma but the pain he describes can radiate from spine to b/l upper quadrants with certain movements. Denies CP/palp/SOB/HA/congestion/fevers/GI or GU c/o. Taking meds as prescribed. No bloody or tarry stool.   Patient Care Team: Mosie Lukes, MD as PCP - General (Family Medicine)   Past Medical History:  Diagnosis Date  . Anemia   . Anemia 08/14/2014  . Anemia 03/16/2016  . Anxiety   . Arthritis   . Back pain 10/10/2012  . Fatty liver 2005   seen on 2005 ultrasound,05/2015 CT.   Marland Kitchen Hemorrhoids 08/22/2016  . Hemorrhoids, internal, with bleeding 2005   internal and external. 2005 band ligation (dr Ferdinand Lango in [facility])  . Hyperglycemia 08/21/2013  . Hyperlipidemia   . Hypertension   . Nocturia 08/21/2013  . Obesity (BMI 30.0-34.9)   . Osteoporosis   . Rectal bleeding 07/30/2014  . 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 Unspecified constipation 02/06/2014  . Vitamin D deficiency     Past Surgical History:  Procedure Laterality Date  . COLONOSCOPY  before 2005, 2012, 2013, 08/2014.   Marland Kitchen FRACTURE SURGERY Left 1989   leg  . HEMORRHOID BANDING  2005  . KNEE SURGERY Left 2010   arthroscopy,  torn carilage  . KNEE SURGERY Right 2012   arthroscopy  . TONSILLECTOMY  1957    Family History  Problem Relation Age of Onset  . Stroke Mother   . Aneurysm Mother        brain  . Arthritis Father   . Liver cancer Father        liver, atomic testing in [employer]  . Gallbladder disease Father   . Hyperlipidemia Sister   . Hypertension Sister   . Obesity Sister   . Diabetes Sister   . Diabetes Brother   . Hyperlipidemia Sister   . Hypertension Sister   . Obesity Sister   . Diabetes Sister   . Aneurysm Brother        brain  . COPD Brother   . Gallbladder disease Sister        x4  . Colon cancer Neg Hx   . Colon polyps Neg Hx   . Esophageal cancer Neg Hx   . Rectal cancer Neg Hx   . Stomach cancer Neg Hx     Social History   Socioeconomic History  . Marital status: Married    Spouse name: Not on file  . Number of children: 1  . Years of education: Not on file  . Highest education level: Not on file  Social Needs  . Financial resource strain: Not on file  . Food insecurity - worry: Not on file  .  Food insecurity - inability: Not on file  . Transportation needs - medical: Not on file  . Transportation needs - non-medical: Not on file  Occupational History  . Occupation: Retired    Fish farm manager: [employer]  Tobacco Use  . Smoking status: Never Smoker  . Smokeless tobacco: Never Used  Substance and Sexual Activity  . Alcohol use: Yes    Alcohol/week: 1.2 oz    Types: 2 Standard drinks or equivalent per week    Comment: Occassionally  . Drug use: No  . Sexual activity: Yes  Other Topics Concern  . Not on file  Social History Narrative  . Not on file    Outpatient Medications Prior to Visit  Medication Sig Dispense Refill  . ALPRAZolam (XANAX) 0.5 MG tablet Take 1 tablet (0.5 mg total) by mouth 2 (two) times daily as needed for anxiety. 10 tablet 0  . ANUCORT-HC 25 MG suppository INSERT 1 SUPPOSITORY (25 MG TOTAL) RECTALLY 2 TIMES DAILY. 12  suppository 3  . azelastine (OPTIVAR) 0.05 % ophthalmic solution Place 1 drop into the left eye 2 (two) times daily. 6 mL 1  . fluticasone (FLONASE) 50 MCG/ACT nasal spray PLACE 2 SPRAYS INTO BOTH NOSTRILS DAILY. 16 g 6  . HYDROcodone-homatropine (HYCODAN) 5-1.5 MG/5ML syrup Take 5 mLs by mouth every 6 (six) hours as needed for cough. 120 mL 0  . ibuprofen (ADVIL,MOTRIN) 400 MG tablet Take 1 tablet (400 mg total) by mouth every 8 (eight) hours as needed. 40 tablet 3  . LIVALO 2 MG TABS TAKE 1 TABLET DAILY 90 tablet 1  . omeprazole (PRILOSEC) 20 MG capsule Take 1 capsule (20 mg total) by mouth daily. 14 capsule 0  . amLODipine-benazepril (LOTREL) 5-20 MG capsule TAKE 1 CAPSULE DAILY (NEED APPOINTMENT FOR FURTHER REFILLS) 90 capsule 2  . hyoscyamine (LEVSIN SL) 0.125 MG SL tablet Place 1 tablet (0.125 mg total) under the tongue every 4 (four) hours as needed. 30 tablet 1  . metoprolol tartrate (LOPRESSOR) 50 MG tablet TAKE 1 TABLET DAILY (NEED APPOINTMENT FOR FURTHER REFILLS) 90 tablet 2   No facility-administered medications prior to visit.     No Known Allergies  Review of Systems  Constitutional: Negative for fever and malaise/fatigue.  HENT: Negative for congestion.   Eyes: Negative for blurred vision.  Respiratory: Negative for cough and shortness of breath.   Cardiovascular: Negative for chest pain, palpitations and leg swelling.  Gastrointestinal: Negative for vomiting.  Musculoskeletal: Negative for back pain.  Skin: Negative for rash.  Neurological: Negative for loss of consciousness and headaches.       Objective:    Physical Exam  Constitutional: He is oriented to person, place, and time. He appears well-developed and well-nourished. No distress.  HENT:  Head: Normocephalic and atraumatic.  Eyes: Conjunctivae are normal.  Neck: Normal range of motion. No thyromegaly present.  Cardiovascular: Normal rate and regular rhythm.  Pulmonary/Chest: Effort normal and breath  sounds normal. He has no wheezes.  Abdominal: Soft. Bowel sounds are normal. There is no tenderness.  Musculoskeletal: Normal range of motion. He exhibits no edema or deformity.  Neurological: He is alert and oriented to person, place, and time.  Skin: Skin is warm and dry. He is not diaphoretic.  Psychiatric: He has a normal mood and affect.    BP 120/80 (BP Location: Left Arm, Patient Position: Sitting, Cuff Size: Normal)   Pulse 80   Temp 98.1 F (36.7 C) (Oral)   Resp 18   Wt 218  lb 6.4 oz (99.1 kg)   SpO2 98%   BMI 31.34 kg/m  Wt Readings from Last 3 Encounters:  07/24/17 218 lb 6.4 oz (99.1 kg)  01/23/17 212 lb (96.2 kg)  08/30/16 215 lb (97.5 kg)   BP Readings from Last 3 Encounters:  07/24/17 120/80  01/24/17 120/81  08/30/16 135/83     Immunization History  Administered Date(s) Administered  . Influenza Whole 04/12/2013  . Influenza, High Dose Seasonal PF 03/11/2016, 04/07/2017  . Influenza,inj,Quad PF,6+ Mos 04/12/2015  . Influenza-Unspecified 04/13/2014  . Pneumococcal Conjugate-13 08/18/2013  . Pneumococcal Polysaccharide-23 02/19/2016  . Tdap 06/30/2010  . Zoster 06/30/2010    Health Maintenance  Topic Date Due  . COLONOSCOPY  08/26/2019  . TETANUS/TDAP  06/30/2020  . INFLUENZA VACCINE  Completed  . Hepatitis C Screening  Completed  . PNA vac Low Risk Adult  Completed    Lab Results  Component Value Date   WBC 4.3 07/24/2017   HGB 13.1 07/24/2017   HCT 39.4 07/24/2017   PLT 211.0 07/24/2017   GLUCOSE 102 (H) 07/24/2017   CHOL 140 07/24/2017   TRIG 73.0 07/24/2017   HDL 35.80 (L) 07/24/2017   LDLCALC 89 07/24/2017   ALT 19 07/24/2017   AST 18 07/24/2017   NA 140 07/24/2017   K 4.2 07/24/2017   CL 106 07/24/2017   CREATININE 1.06 07/24/2017   BUN 12 07/24/2017   CO2 28 07/24/2017   TSH 0.80 07/24/2017   PSA 1.08 08/07/2014   HGBA1C 6.0 08/07/2014    Lab Results  Component Value Date   TSH 0.80 07/24/2017   Lab Results  Component  Value Date   WBC 4.3 07/24/2017   HGB 13.1 07/24/2017   HCT 39.4 07/24/2017   MCV 84.8 07/24/2017   PLT 211.0 07/24/2017   Lab Results  Component Value Date   NA 140 07/24/2017   K 4.2 07/24/2017   CO2 28 07/24/2017   GLUCOSE 102 (H) 07/24/2017   BUN 12 07/24/2017   CREATININE 1.06 07/24/2017   BILITOT 0.5 07/24/2017   ALKPHOS 75 07/24/2017   AST 18 07/24/2017   ALT 19 07/24/2017   PROT 7.1 07/24/2017   ALBUMIN 4.0 07/24/2017   CALCIUM 9.1 07/24/2017   ANIONGAP 11 01/23/2017   GFR 88.98 07/24/2017   Lab Results  Component Value Date   CHOL 140 07/24/2017   Lab Results  Component Value Date   HDL 35.80 (L) 07/24/2017   Lab Results  Component Value Date   LDLCALC 89 07/24/2017   Lab Results  Component Value Date   TRIG 73.0 07/24/2017   Lab Results  Component Value Date   CHOLHDL 4 07/24/2017   Lab Results  Component Value Date   HGBA1C 6.0 08/07/2014         Assessment & Plan:   Problem List Items Addressed This Visit    Hyperlipidemia    Encouraged heart healthy diet, increase exercise, avoid trans fats, consider a krill oil cap daily      Relevant Medications   amLODipine-benazepril (LOTREL) 5-20 MG capsule   metoprolol tartrate (LOPRESSOR) 50 MG tablet   Other Relevant Orders   Lipid panel (Completed)   Hypertension, essential     Well controlled, no changes to meds. Encouraged heart healthy diet such as the DASH diet and exercise as tolerated.       Relevant Medications   amLODipine-benazepril (LOTREL) 5-20 MG capsule   metoprolol tartrate (LOPRESSOR) 50 MG tablet   Other Relevant  Orders   CBC with Differential/Platelet (Completed)   Comprehensive metabolic panel (Completed)   TSH (Completed)   Generalized abdominal pain    Experiencing some intermittent upper and lower pain he cannot identify any triggers such as eating, bowel movements certain movements. AXR shows some air fluid pockets and stool due to history will proceed with CT  abdomen if patient a      Abnormal CT scan, gastrointestinal tract    With increased abdominal pain and a history of bowel obstruction, will repeat CT scan      Relevant Orders   DG Abd 2 Views (Completed)   Anemia    Increase leafy greens, consider increased lean red meat and using cast iron cookware. Continue to monitor, report any concerns. Check cbc today      Relevant Orders   Ferritin (Completed)    Other Visit Diagnoses    High risk medication use    -  Primary   Relevant Orders   Pain Mgmt, Profile 8 w/Conf, U   Abdominal pain, unspecified abdominal location       Relevant Medications   hyoscyamine (LEVSIN SL) 0.125 MG SL tablet   Other Relevant Orders   DG Abd 2 Views (Completed)      I am having [name] maintain his azelastine, HYDROcodone-homatropine, fluticasone, ibuprofen, omeprazole, LIVALO, ANUCORT-HC, ALPRAZolam, hyoscyamine, amLODipine-benazepril, and metoprolol tartrate.  Meds ordered this encounter  Medications  . DISCONTD: amLODipine-benazepril (LOTREL) 5-20 MG capsule    Sig: TAKE 1 CAPSULE DAILY (NEED APPOINTMENT FOR FURTHER REFILLS)    Dispense:  90 capsule    Refill:  2  . DISCONTD: metoprolol tartrate (LOPRESSOR) 50 MG tablet    Sig: TAKE 1 TABLET DAILY    Dispense:  90 tablet    Refill:  2  . hyoscyamine (LEVSIN SL) 0.125 MG SL tablet    Sig: Place 1 tablet (0.125 mg total) under the tongue every 4 (four) hours as needed.    Dispense:  30 tablet    Refill:  1  . amLODipine-benazepril (LOTREL) 5-20 MG capsule    Sig: TAKE 1 CAPSULE DAILY (NEED APPOINTMENT FOR FURTHER REFILLS)    Dispense:  90 capsule    Refill:  2  . metoprolol tartrate (LOPRESSOR) 50 MG tablet    Sig: TAKE 1 TABLET DAILY    Dispense:  90 tablet    Refill:  2    CMA served as scribe during this visit. History, Physical and Plan performed by medical provider. Documentation and orders reviewed and attested to.  Penni Homans, MD

## 2017-07-24 NOTE — Assessment & Plan Note (Signed)
Well controlled, no changes to meds. Encouraged heart healthy diet such as the DASH diet and exercise as tolerated.  °

## 2017-07-24 NOTE — Patient Instructions (Addendum)
Encouraged increased hydration and fiber in diet. Daily probiotics. If bowels not moving can use MOM 2 tbls po in 4 oz of warm prune juice by mouth every 2-3 days. If no results then repeat in 4 hours with  Dulcolax suppository pr, may repeat again in 4 more hours as needed. Seek care if symptoms worsen. Consider daily Miralax and/or Dulcolax if symptoms persist.  Miralax and Benefiber together twice daily Lidocaine patches or gel    Abdominal Pain, Adult Many things can cause belly (abdominal) pain. Most times, belly pain is not dangerous. Many cases of belly pain can be watched and treated at home. Sometimes belly pain is serious, though. Your doctor will try to find the cause of your belly pain. Follow these instructions at home:  Take over-the-counter and prescription medicines only as told by your doctor. Do not take medicines that help you poop (laxatives) unless told to by your doctor.  Drink enough fluid to keep your pee (urine) clear or pale yellow.  Watch your belly pain for any changes.  Keep all follow-up visits as told by your doctor. This is important. Contact a doctor if:  Your belly pain changes or gets worse.  You are not hungry, or you lose weight without trying.  You are having trouble pooping (constipated) or have watery poop (diarrhea) for more than 2-3 days.  You have pain when you pee or poop.  Your belly pain wakes you up at night.  Your pain gets worse with meals, after eating, or with certain foods.  You are throwing up and cannot keep anything down.  You have a fever. Get help right away if:  Your pain does not go away as soon as your doctor says it should.  You cannot stop throwing up.  Your pain is only in areas of your belly, such as the right side or the left lower part of the belly.  You have bloody or black poop, or poop that looks like tar.  You have very bad pain, cramping, or bloating in your belly.  You have signs of not having enough  fluid or water in your body (dehydration), such as: ? Dark pee, very little pee, or no pee. ? Cracked lips. ? Dry mouth. ? Sunken eyes. ? Sleepiness. ? Weakness. This information is not intended to replace advice given to you by your health care provider. Make sure you discuss any questions you have with your health care provider. Document Released: 12/03/2007 Document Revised: 01/04/2016 Document Reviewed: 11/28/2015 Elsevier Interactive Patient Education  2018 [employer].  mouth every 2-3 days. If no results then repeat in 4 hours with  Dulcolax suppository pr, may repeat again in 4 more hours as needed. Seek care if symptoms worsen. Consider daily Miralax and/or Dulcolax if symptoms persist.    Mix Miralax with Benefiber twice daily

## 2017-07-26 NOTE — Assessment & Plan Note (Signed)
Experiencing some intermittent upper and lower pain he cannot identify any triggers such as eating, bowel movements certain movements. AXR shows some air fluid pockets and stool due to history will proceed with CT abdomen if patient a

## 2017-07-27 MED ORDER — FERROUS FUMARATE-FOLIC ACID 324-1 MG PO TABS: 1.0000 mg | ORAL_TABLET | Freq: Every day | ORAL | 1 refills | Status: DC

## 2017-07-27 NOTE — Addendum Note (Signed)
Addended by: Magdalene Molly A on: 07/27/2017 02:34 PM   Modules accepted: Orders

## 2017-07-27 NOTE — Addendum Note (Signed)
Addended by: Magdalene Molly A on: 07/27/2017 02:16 PM   Modules accepted: Orders

## 2017-07-29 LAB — PAIN MGMT, PROFILE 8 W/CONF, U
6 Acetylmorphine: NEGATIVE ng/mL (ref <10)
Alcohol Metabolites: POSITIVE ng/mL — AB (ref <500)
Alphahydroxyalprazolam: 75 ng/mL — ABNORMAL HIGH (ref <25)
Alphahydroxymidazolam: NEGATIVE ng/mL (ref <50)
Alphahydroxytriazolam: NEGATIVE ng/mL (ref <50)
Aminoclonazepam: NEGATIVE ng/mL (ref <25)
Amphetamines: NEGATIVE ng/mL (ref <500)
Benzodiazepines: POSITIVE ng/mL — AB (ref <100)
Buprenorphine, Urine: NEGATIVE ng/mL (ref <5)
Cocaine Metabolite: NEGATIVE ng/mL (ref <150)
Creatinine: 155.2 mg/dL
Ethyl Glucuronide (ETG): 1318 ng/mL — ABNORMAL HIGH (ref <500)
Ethyl Sulfate (ETS): 242 ng/mL — ABNORMAL HIGH (ref <100)
Hydroxyethylflurazepam: NEGATIVE ng/mL (ref <50)
Lorazepam: NEGATIVE ng/mL (ref <50)
MDMA: NEGATIVE ng/mL (ref <500)
Marijuana Metabolite: NEGATIVE ng/mL (ref <20)
Nordiazepam: NEGATIVE ng/mL (ref <50)
Opiates: NEGATIVE ng/mL (ref <100)
Oxazepam: NEGATIVE ng/mL (ref <50)
Oxidant: NEGATIVE ug/mL (ref <200)
Oxycodone: NEGATIVE ng/mL (ref <100)
Temazepam: NEGATIVE ng/mL (ref <50)
pH: 6.07 (ref 4.5–9.0)

## 2017-07-31 ENCOUNTER — Other Ambulatory Visit: Payer: Self-pay | Admitting: Family Medicine

## 2017-07-31 ENCOUNTER — Telehealth: Payer: Self-pay

## 2017-07-31 DIAGNOSIS — Z8719 Personal history of other diseases of the digestive system: Secondary | ICD-10-CM

## 2017-07-31 DIAGNOSIS — R1084 Generalized abdominal pain: Secondary | ICD-10-CM

## 2017-07-31 DIAGNOSIS — Z86010 Personal history of colonic polyps: Secondary | ICD-10-CM

## 2017-07-31 DIAGNOSIS — R109 Unspecified abdominal pain: Secondary | ICD-10-CM

## 2017-07-31 DIAGNOSIS — R195 Other fecal abnormalities: Secondary | ICD-10-CM

## 2017-07-31 DIAGNOSIS — K56609 Unspecified intestinal obstruction, unspecified as to partial versus complete obstruction: Secondary | ICD-10-CM

## 2017-07-31 DIAGNOSIS — R933 Abnormal findings on diagnostic imaging of other parts of digestive tract: Secondary | ICD-10-CM

## 2017-07-31 NOTE — Telephone Encounter (Signed)
Patient would like to proceed with the CT scan    Please advise

## 2017-07-31 NOTE — Telephone Encounter (Signed)
Ordered

## 2017-08-05 ENCOUNTER — Other Ambulatory Visit (INDEPENDENT_AMBULATORY_CARE_PROVIDER_SITE_OTHER): Payer: Medicare Other

## 2017-08-05 DIAGNOSIS — D649 Anemia, unspecified: Secondary | ICD-10-CM

## 2017-08-05 LAB — FECAL OCCULT BLOOD, IMMUNOCHEMICAL: Fecal Occult Bld: NEGATIVE

## 2017-08-06 ENCOUNTER — Ambulatory Visit (HOSPITAL_BASED_OUTPATIENT_CLINIC_OR_DEPARTMENT_OTHER)
Admission: RE | Admit: 2017-08-06 | Discharge: 2017-08-06 | Disposition: A | Discharge status: Home / Self Care | Payer: Medicare Other | Source: Ambulatory Visit | Attending: Family Medicine | Admitting: Family Medicine

## 2017-08-06 DIAGNOSIS — R1084 Generalized abdominal pain: Secondary | ICD-10-CM | POA: Diagnosis not present

## 2017-08-06 DIAGNOSIS — Z86010 Personal history of colonic polyps: Secondary | ICD-10-CM | POA: Insufficient documentation

## 2017-08-06 DIAGNOSIS — R933 Abnormal findings on diagnostic imaging of other parts of digestive tract: Secondary | ICD-10-CM | POA: Insufficient documentation

## 2017-08-06 DIAGNOSIS — N281 Cyst of kidney, acquired: Secondary | ICD-10-CM | POA: Diagnosis not present

## 2017-08-06 DIAGNOSIS — Z8719 Personal history of other diseases of the digestive system: Secondary | ICD-10-CM | POA: Diagnosis not present

## 2017-08-06 DIAGNOSIS — R109 Unspecified abdominal pain: Secondary | ICD-10-CM

## 2017-08-06 DIAGNOSIS — R195 Other fecal abnormalities: Secondary | ICD-10-CM | POA: Insufficient documentation

## 2017-08-18 NOTE — Progress Notes (Signed)
I do not see that he has contacted [place]  This may be a self-limited issue but we will contact them about an appointment with me or an APP

## 2017-08-31 ENCOUNTER — Encounter: Payer: Self-pay | Admitting: Physician Assistant

## 2017-08-31 ENCOUNTER — Other Ambulatory Visit (INDEPENDENT_AMBULATORY_CARE_PROVIDER_SITE_OTHER): Payer: Medicare Other

## 2017-08-31 ENCOUNTER — Ambulatory Visit (INDEPENDENT_AMBULATORY_CARE_PROVIDER_SITE_OTHER): Payer: Medicare Other | Admitting: Physician Assistant

## 2017-08-31 VITALS — BP 132/86 | HR 68 | Ht 69.25 in | Wt 219.0 lb

## 2017-08-31 DIAGNOSIS — R109 Unspecified abdominal pain: Secondary | ICD-10-CM | POA: Diagnosis not present

## 2017-08-31 DIAGNOSIS — R935 Abnormal findings on diagnostic imaging of other abdominal regions, including retroperitoneum: Secondary | ICD-10-CM | POA: Diagnosis not present

## 2017-08-31 LAB — HIGH SENSITIVITY CRP: CRP, High Sensitivity: 0.85 mg/L (ref 0.000–5.000)

## 2017-08-31 LAB — SEDIMENTATION RATE: Sed Rate: 10 mm/hr (ref 0–20)

## 2017-08-31 NOTE — Progress Notes (Addendum)
Subjective:    Patient ID: Yanixan Mellinger, male    DOB: 21-Mar-1946, 72 y.o.   MRN: [id number]  HPI Aarish is a pleasant 72 year old African-American male, known to Dr. Carlean Purl who is referred today by Dr. Vivien Rossetti for evaluation of abnormal CT scan. Patient has history of hypertension, adenomatous colon polyps, diverticulosis, and was hospitalized in December 2016 with a partial small bowel obstruction secondary to an enteritis with a loop of small bowel showing wall thickening in the anterior lower abdomen, there was adjacent soft tissue stranding in the mesenteric fat and a small amount of free fluid in the pelvis. He was seen by GI after that hospitalization and CT enterography was done in January 2017, about 1 month later and was read as negative. Patient had colonoscopy in February 2016 with finding of 2 small adenomatous polyps and sigmoid diverticulosis. He comes in today stating that he has been having some pain in his left mid abdomen off and on for 3-4 months. He says this is crampy in nature, and sometimes worse before and with a bowel movement. His bowels have been moving fairly well though symptoms constipation. He has not noted any melena or hematochezia. He does feel that he gets some trapped gas under his ribs off and on. He has not had any nausea or vomiting. He says sometimes first thing in the morning his abdomen will feel somewhat distended. He had CT of the abdomen and pelvis done on 08/06/2016 which showed some thick-walled small bowel loops in the proximal jejunum without evidence of obstruction. Recent CBC, and chemistries unremarkable. Ferritin done on 07/24/2017 low at 13.9 stool Hemoccults were negative. Reviewing his medications he is on amlodipine-benazepril for blood pressure.  Review of Systems Pertinent positive and negative review of systems were noted in the above HPI section.  All other review of systems was otherwise negative.  Outpatient Encounter  Medications as of 08/31/2017  Medication Sig  . ALPRAZolam (XANAX) 0.5 MG tablet Take 1 tablet (0.5 mg total) by mouth 2 (two) times daily as needed for anxiety.  Marland Kitchen amLODipine-benazepril (LOTREL) 5-20 MG capsule TAKE 1 CAPSULE DAILY (NEED APPOINTMENT FOR FURTHER REFILLS)  . ANUCORT-HC 25 MG suppository INSERT 1 SUPPOSITORY (25 MG TOTAL) RECTALLY 2 TIMES DAILY.  Marland Kitchen azelastine (OPTIVAR) 0.05 % ophthalmic solution Place 1 drop into the left eye 2 (two) times daily.  . Ferrous Fumarate-Folic Acid 756-4 MG TABS Take 1 mg by mouth daily.  . fluticasone (FLONASE) 50 MCG/ACT nasal spray PLACE 2 SPRAYS INTO BOTH NOSTRILS DAILY.  Marland Kitchen HYDROcodone-homatropine (HYCODAN) 5-1.5 MG/5ML syrup Take 5 mLs by mouth every 6 (six) hours as needed for cough.  . hyoscyamine (LEVSIN SL) 0.125 MG SL tablet Place 1 tablet (0.125 mg total) under the tongue every 4 (four) hours as needed.  Marland Kitchen ibuprofen (ADVIL,MOTRIN) 400 MG tablet Take 1 tablet (400 mg total) by mouth every 8 (eight) hours as needed.  Marland Kitchen LIVALO 2 MG TABS TAKE 1 TABLET DAILY  . metoprolol tartrate (LOPRESSOR) 50 MG tablet TAKE 1 TABLET DAILY  . omeprazole (PRILOSEC) 20 MG capsule Take 1 capsule (20 mg total) by mouth daily.  . polyethylene glycol powder (GLYCOLAX/MIRALAX) powder Take 17 Containers by mouth as needed.   . Psyllium (METAMUCIL PO) Take 1 Dose by mouth as needed.   No facility-administered encounter medications on file as of 08/31/2017.    No Known Allergies Patient Active Problem List   Diagnosis Date Noted  . Hemorrhoids 08/22/2016  . Anemia 03/16/2016  .  Right shoulder pain 02/26/2016  . SBO (small bowel obstruction) ([place])   . Abnormal CT scan, gastrointestinal tract   . Hypokalemia 06/10/2015  . Loose stools 06/07/2015  . Generalized abdominal pain   . Low back pain 10/10/2012  . Hyperlipidemia   . Hypertension, essential    . History of colon polyps   . Obesity (BMI 30.0-34.9)    Social History   Socioeconomic History  . Marital  status: Married    Spouse name: Not on file  . Number of children: 1  . Years of education: Not on file  . Highest education level: Not on file  Social Needs  . Financial resource strain: Not on file  . Food insecurity - worry: Not on file  . Food insecurity - inability: Not on file  . Transportation needs - medical: Not on file  . Transportation needs - non-medical: Not on file  Occupational History  . Occupation: Retired    Fish farm manager: [employer]  Tobacco Use  . Smoking status: Never Smoker  . Smokeless tobacco: Never Used  Substance and Sexual Activity  . Alcohol use: Yes    Alcohol/week: 1.2 oz    Types: 2 Standard drinks or equivalent per week    Comment: Occassionally  . Drug use: No  . Sexual activity: Yes  Other Topics Concern  . Not on file  Social History Narrative  . Not on file    Mr. Bazzi family history includes Aneurysm in his brother and mother; Arthritis in his father; COPD in his brother; Diabetes in his brother, sister, and sister; Gallbladder disease in his father and sister; Hyperlipidemia in his sister and sister; Hypertension in his sister and sister; Liver cancer in his father; Obesity in his sister and sister; Stroke in his mother.      Objective:    Vitals:   08/31/17 1354  BP: 132/86  Pulse: 68    Physical Exam; well-developed older African-American male in no acute distress, accompanied by his wife, both pleasant blood pressure 132/86 pulse 68, height 5 foot 9, weight 219, BMI 32.1. HEENT ;nontraumatic normocephalic EOMI PERRLA sclera anicteric, Cardiovascular; regular rate and rhythm with S1-S2 no murmur or gallop, Pulmonary ;clear bilaterally, Abdomen; soft, bowel sounds are present, he's tender in the left mid quadrant and to the left of the umbilicus no guarding or rebound no palpable mass or hepatosplenomegaly, Rectal; exam not done, Ext; no clubbing cyanosis or edema skin warm and dry, Neuropsych; mood and affect appropriate        Assessment & Plan:   #35 72 year old African-American male with 3-4 month history of left mid abdominal pain, crampy in nature associated with intermittent mild distention, and CT suggesting thickened small bowel loops in the proximal jejunum. Patient also had hospitalization in December 2016 for a partial small bowel obstruction ( patient has not had any prior abdominal surgery)'s secondary to enteritis as outlined above. CT enterography done a month after this episode was negative.   Rule out IBD/Crohn's disease, rule out intermittent angioedema of the small bowel, or hereditary angioedema. Patient is on an ACE inhibitor. His most current episode has been present for the past 3-4 months making that seem less likely.  #2 history of adenomatous colon polyps-up-to-date with colonoscopy last done February 2016 #3 diverticulosis #4 hypertension  Plan; patient will continue using Levsin sublingual when necessary. We discussed use of MiraLAX on a daily or every other day basis to keep his bowels moving. Will schedule for small bowel enteroscopy  with Dr. Carlean Purl. Procedure was discussed in detail with patient and his wife including indications risks and benefits and he is agreeable to proceed. Depending on results of enteroscopy he may need further imaging. We'll check sedimentation rate, CRP, C4 level and C1 esterase inhibitor. May want to stop his ACE inhibitor/Lotrel. I'll send a message to Dr. Charlett Blake and suggest so that he can be placed on an alternative class of drugs.    Demiya Magno S Lerone Onder PA-C 08/31/2017   Cc: Mosie Lukes, MD  Agree with Ms. Genia Harold assessment and plan. Gatha Mayer, MD, [facility]

## 2017-08-31 NOTE — Patient Instructions (Signed)
Please go to the basement level to have your labs drawn.  USe Levsin as needed every 4-6 hours.   We may Stop Lotrel- we will discuss this with Dr. Vivien Rossetti first.  You may usse Miralax daily.  If you are age 70 or older, your body mass index should be between 23-30. Your Body mass index is 32.11 kg/m. If this is out of the aforementioned range listed, please consider follow up with your Primary Care Provider.

## 2017-09-01 LAB — C1 ESTERASE INHIBITOR: C1INH SerPl-mCnc: 25 mg/dL (ref 21–39)

## 2017-09-01 LAB — C4 COMPLEMENT: C4 Complement: 26 mg/dL (ref 15–53)

## 2017-09-18 ENCOUNTER — Encounter (HOSPITAL_COMMUNITY): Payer: Self-pay

## 2017-09-22 ENCOUNTER — Encounter (HOSPITAL_COMMUNITY): Payer: Self-pay

## 2017-09-22 ENCOUNTER — Other Ambulatory Visit: Payer: Self-pay

## 2017-10-05 ENCOUNTER — Ambulatory Visit (HOSPITAL_COMMUNITY): Payer: Medicare Other | Admitting: Anesthesiology

## 2017-10-05 ENCOUNTER — Ambulatory Visit (HOSPITAL_COMMUNITY)
Admission: RE | Admit: 2017-10-05 | Discharge: 2017-10-05 | Disposition: A | Discharge status: Home / Self Care | Payer: Medicare Other | Source: Ambulatory Visit | Attending: Internal Medicine | Admitting: Internal Medicine

## 2017-10-05 ENCOUNTER — Encounter (HOSPITAL_COMMUNITY): Payer: Self-pay

## 2017-10-05 ENCOUNTER — Encounter (HOSPITAL_COMMUNITY)
Admission: RE | Disposition: A | Discharge status: Home / Self Care | Payer: Self-pay | Source: Ambulatory Visit | Attending: Internal Medicine

## 2017-10-05 ENCOUNTER — Other Ambulatory Visit: Payer: Self-pay

## 2017-10-05 DIAGNOSIS — Z79899 Other long term (current) drug therapy: Secondary | ICD-10-CM | POA: Diagnosis not present

## 2017-10-05 DIAGNOSIS — R933 Abnormal findings on diagnostic imaging of other parts of digestive tract: Secondary | ICD-10-CM

## 2017-10-05 DIAGNOSIS — E785 Hyperlipidemia, unspecified: Secondary | ICD-10-CM | POA: Diagnosis not present

## 2017-10-05 DIAGNOSIS — R935 Abnormal findings on diagnostic imaging of other abdominal regions, including retroperitoneum: Secondary | ICD-10-CM

## 2017-10-05 DIAGNOSIS — F419 Anxiety disorder, unspecified: Secondary | ICD-10-CM | POA: Insufficient documentation

## 2017-10-05 DIAGNOSIS — Z86010 Personal history of colonic polyps: Secondary | ICD-10-CM | POA: Diagnosis not present

## 2017-10-05 DIAGNOSIS — I10 Essential (primary) hypertension: Secondary | ICD-10-CM | POA: Insufficient documentation

## 2017-10-05 DIAGNOSIS — D649 Anemia, unspecified: Secondary | ICD-10-CM | POA: Diagnosis not present

## 2017-10-05 DIAGNOSIS — K429 Umbilical hernia without obstruction or gangrene: Secondary | ICD-10-CM | POA: Diagnosis not present

## 2017-10-05 DIAGNOSIS — R109 Unspecified abdominal pain: Secondary | ICD-10-CM

## 2017-10-05 DIAGNOSIS — K529 Noninfective gastroenteritis and colitis, unspecified: Secondary | ICD-10-CM | POA: Diagnosis not present

## 2017-10-05 HISTORY — PX: ENTEROSCOPY: SHX5533

## 2017-10-05 HISTORY — DX: Pneumonia, unspecified organism: J18.9

## 2017-10-05 SURGERY — ENTEROSCOPY
Anesthesia: Monitor Anesthesia Care

## 2017-10-05 MED ORDER — PROPOFOL 10 MG/ML IV BOLUS
INTRAVENOUS | Status: DC | PRN
  Administered 2017-10-05 (×5): 20 mg via INTRAVENOUS

## 2017-10-05 MED ORDER — PROPOFOL 10 MG/ML IV BOLUS
INTRAVENOUS | Status: AC
  Filled 2017-10-05: qty 40

## 2017-10-05 MED ORDER — SODIUM CHLORIDE 0.9 % IV SOLN: INTRAVENOUS | Status: DC

## 2017-10-05 MED ORDER — PROPOFOL 500 MG/50ML IV EMUL
INTRAVENOUS | Status: DC | PRN
  Administered 2017-10-05: 130 ug/kg/min via INTRAVENOUS

## 2017-10-05 MED ORDER — PROPOFOL 10 MG/ML IV BOLUS
INTRAVENOUS | Status: AC
  Filled 2017-10-05: qty 20

## 2017-10-05 MED ORDER — LACTATED RINGERS IV SOLN
INTRAVENOUS | Status: DC
  Administered 2017-10-05: 1000 mL via INTRAVENOUS

## 2017-10-05 NOTE — Discharge Instructions (Signed)
° ° ° °  Things looked normal - I took biopsies in the area of the bowel that was thickened on CT - that might turn up inflammation and provide guidance.  I will let you know results and recommendations soon.  I appreciate the opportunity to care for you. Gatha Mayer, MD, FACG  YOU HAD AN ENDOSCOPIC PROCEDURE TODAY: Refer to the procedure report and other information in the discharge instructions given to you for any specific questions about what was found during the examination. If this information does not answer your questions, please call Dr. Celesta Aver office at [phone] to clarify.   YOU SHOULD EXPECT: Some feelings of bloating in the abdomen. Passage of more gas than usual. Walking can help get rid of the air that was put into your GI tract during the procedure and reduce the bloating. If you had a lower endoscopy (such as a colonoscopy or flexible sigmoidoscopy) you may notice spotting of blood in your stool or on the toilet paper. Some abdominal soreness may be present for a day or two, also.  DIET: Your first meal following the procedure should be a light meal and then it is ok to progress to your normal diet. A half-sandwich or bowl of soup is an example of a good first meal. Heavy or fried foods are harder to digest and may make you feel nauseous or bloated. Drink plenty of fluids but you should avoid alcoholic beverages for 24 hours.   ACTIVITY: Your care partner should take you home directly after the procedure. You should plan to take it easy, moving slowly for the rest of the day. You can resume normal activity the day after the procedure however YOU SHOULD NOT DRIVE, use power tools, machinery or perform tasks that involve climbing or major physical exertion for 24 hours (because of the sedation medicines used during the test).   SYMPTOMS TO REPORT IMMEDIATELY: A gastroenterologist can be reached at any hour. Please call [phone]  for any of the following symptoms:    Following upper endoscopy (EGD, EUS, ERCP, esophageal dilation) Vomiting of blood or coffee ground material  New, significant abdominal pain  New, significant chest pain or pain under the shoulder blades  Painful or persistently difficult swallowing  New shortness of breath  Black, tarry-looking or red, bloody stools  FOLLOW UP:  If any biopsies were taken you will be contacted by phone or by letter within the next 1-3 weeks. Call [phone]  if you have not heard about the biopsies in 3 weeks.  Please also call with any specific questions about appointments or follow up tests.

## 2017-10-05 NOTE — Anesthesia Postprocedure Evaluation (Signed)
Anesthesia Post Note  Patient: [name]  Procedure(s) Performed: ENTEROSCOPY (N/A )     Patient location during evaluation: PACU Anesthesia Type: MAC Level of consciousness: awake and alert Pain management: pain level controlled Vital Signs Assessment: post-procedure vital signs reviewed and stable Respiratory status: spontaneous breathing, nonlabored ventilation and respiratory function stable Cardiovascular status: stable and blood pressure returned to baseline Postop Assessment: no apparent nausea or vomiting Anesthetic complications: no    Last Vitals:  Vitals:   10/05/17 1240 10/05/17 1250  BP: 131/60 118/69  Pulse: 67 (!) 55  Resp: 13 13  Temp:    SpO2: 100% 100%    Last Pain:  Vitals:   10/05/17 1240  TempSrc:   PainSc: 0-No pain                 Lynda Rainwater

## 2017-10-05 NOTE — Transfer of Care (Signed)
Immediate Anesthesia Transfer of Care Note  Patient: [name]  Procedure(s) Performed: ENTEROSCOPY (N/A )  Patient Location: PACU and Endoscopy Unit  Anesthesia Type:MAC  Level of Consciousness: awake, alert , oriented and patient cooperative  Airway & Oxygen Therapy: Patient Spontanous Breathing and Patient connected to nasal cannula oxygen  Post-op Assessment: Report given to RN, Post -op Vital signs reviewed and stable and Patient moving all extremities  Post vital signs: Reviewed and stable  Last Vitals:  Vitals Value Taken Time  BP 109/60 10/05/2017 12:33 PM  Temp 36.4 C 10/05/2017 12:33 PM  Pulse 58 10/05/2017 12:33 PM  Resp 16 10/05/2017 12:33 PM  SpO2 100 % 10/05/2017 12:33 PM  Vitals shown include unvalidated device data.  Last Pain:  Vitals:   10/05/17 1233  TempSrc: Oral  PainSc: 0-No pain         Complications: No apparent anesthesia complications

## 2017-10-05 NOTE — Anesthesia Preprocedure Evaluation (Signed)
Anesthesia Evaluation  Patient identified by MRN, date of birth, ID band Patient awake    Reviewed: Allergy & Precautions, NPO status , Patient's Chart, lab work & pertinent test results, reviewed documented beta blocker date and time   Airway Mallampati: II  TM Distance: >3 FB Neck ROM: Full    Dental no notable dental hx.    Pulmonary neg pulmonary ROS,    Pulmonary exam normal breath sounds clear to auscultation       Cardiovascular hypertension, Pt. on medications and Pt. on home beta blockers negative cardio ROS Normal cardiovascular exam Rhythm:Regular Rate:Normal     Neuro/Psych Anxiety negative neurological ROS  negative psych ROS   GI/Hepatic negative GI ROS, Neg liver ROS,   Endo/Other  negative endocrine ROS  Renal/GU negative Renal ROS  negative genitourinary   Musculoskeletal negative musculoskeletal ROS (+) Arthritis , Osteoarthritis,    Abdominal   Peds negative pediatric ROS (+)  Hematology negative hematology ROS (+)   Anesthesia Other Findings   Reproductive/Obstetrics negative OB ROS                             Anesthesia Physical Anesthesia Plan  ASA: II  Anesthesia Plan: MAC   Post-op Pain Management:    Induction: Intravenous  PONV Risk Score and Plan: 1 and Treatment may vary due to age or medical condition  Airway Management Planned:   Additional Equipment:   Intra-op Plan:   Post-operative Plan:   Informed Consent: I have reviewed the patients History and Physical, chart, labs and discussed the procedure including the risks, benefits and alternatives for the proposed anesthesia with the patient or authorized representative who has indicated his/her understanding and acceptance.   Dental advisory given  Plan Discussed with: CRNA  Anesthesia Plan Comments:         Anesthesia Quick Evaluation

## 2017-10-05 NOTE — H&P (Signed)
[facility] History and Physical   Primary Care Physician:  Mosie Lukes, MD   Reason for Procedure:   Evaluate thickened jejunum on CY  Plan:    Enteroscopy The risks and benefits as well as alternatives of endoscopic procedure(s) have been discussed and reviewed. All questions answered. The patient agrees to proceed.    HPI: [name] is a 72 y.o. male seen by [place] in past and on 08/31/2017 (Amy esterwood PA-C) - see her HPI from that date. No changes since then, though inflammatory markers ESR, CRP and C1 esterase inhibitor and C4 NL also.  HPI [name] is a pleasant 72 year old African-American male, known to Dr. Carlean Purl who is referred today by Dr. Vivien Rossetti for evaluation of abnormal CT scan. Patient has history of hypertension, adenomatous colon polyps, diverticulosis, and was hospitalized in December 2016 with a partial small bowel obstruction secondary to an enteritis with a loop of small bowel showing wall thickening in the anterior lower abdomen, there was adjacent soft tissue stranding in the mesenteric fat and a small amount of free fluid in the pelvis. He was seen by GI after that hospitalization and CT enterography was done in January 2017, about 1 month later and was read as negative. Patient had colonoscopy in February 2016 with finding of 2 small adenomatous polyps and sigmoid diverticulosis. He comes in today stating that he has been having some pain in his left mid abdomen off and on for 3-4 months. He says this is crampy in nature, and sometimes worse before and with a bowel movement. His bowels have been moving fairly well though symptoms constipation. He has not noted any melena or hematochezia. He does feel that he gets some trapped gas under his ribs off and on. He has not had any nausea or vomiting. He says sometimes first thing in the morning his abdomen will feel somewhat distended. He had CT of the abdomen and pelvis done on 08/06/2016 which  showed some thick-walled small bowel loops in the proximal jejunum without evidence of obstruction. Recent CBC, and chemistries unremarkable. Ferritin done on 07/24/2017 low at 13.9 stool Hemoccults were negative. Reviewing his medications he is on amlodipine-benazepril for blood pressure.   Past Medical History:  Diagnosis Date  . Anemia 03/16/2016  . Anxiety   . Arthritis   . Back pain 10/10/2012  . Fatty liver 2005   seen on 2005 ultrasound,05/2015 CT.   Marland Kitchen Hemorrhoids 08/22/2016  . Hemorrhoids, internal, with bleeding 2005   internal and external. 2005 band ligation (dr Ferdinand Lango in [facility])  . Hyperglycemia 08/21/2013  . Hyperlipidemia   . Hypertension   . Nocturia 08/21/2013  . Obesity (BMI 30.0-34.9)   . Osteoporosis   . Pneumonia    as a child  . Rectal bleeding 07/30/2014  . Renal cyst   . 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 Unspecified constipation 02/06/2014  . Vitamin D deficiency     Past Surgical History:  Procedure Laterality Date  . COLONOSCOPY  before 2005, 2012, 2013, 08/2014.   Marland Kitchen FRACTURE SURGERY Left 1989   leg  . HEMORRHOID BANDING  2005  . KNEE SURGERY Left 2010   arthroscopy, torn carilage  . KNEE SURGERY Right 2012   arthroscopy  . TONSILLECTOMY  1957    Prior to Admission medications   Medication Sig Start Date End Date Taking? Authorizing Provider  amLODipine-benazepril (LOTREL) 5-20 MG capsule TAKE 1 CAPSULE DAILY (NEED APPOINTMENT FOR FURTHER REFILLS) Patient taking  differently: Take 1 capsule by mouth daily.  07/24/17  Yes Mosie Lukes, MD  azelastine (OPTIVAR) 0.05 % ophthalmic solution Place 1 drop into the left eye 2 (two) times daily. Patient taking differently: Place 1 drop into both eyes 2 (two) times daily as needed (for itching).  02/19/16  Yes Mosie Lukes, MD  hydrocortisone (HEMMOREX-HC) 25 MG suppository Place 25 mg rectally 2 (two) times daily as needed for hemorrhoids or anal itching.   Yes [provider]  ibuprofen (ADVIL,MOTRIN) 400 MG tablet Take 1 tablet (400 mg total) by mouth every 8 (eight) hours as needed. Patient taking differently: Take 400 mg by mouth every 8 (eight) hours as needed for headache or moderate pain.  11/07/16  Yes Mosie Lukes, MD  LIVALO 2 MG TABS TAKE 1 TABLET DAILY Patient taking differently: Take 2 mg by mouth daily 04/14/17  Yes Mosie Lukes, MD  metoprolol tartrate (LOPRESSOR) 50 MG tablet TAKE 1 TABLET DAILY Patient taking differently: Take 50 mg by mouth daily.  07/24/17  Yes Mosie Lukes, MD  Multiple Vitamins-Minerals (MULTIVITAMIN ADULT PO) Take 1 tablet by mouth daily.   Yes [provider]  polyethylene glycol (MIRALAX / GLYCOLAX) packet Take 17 g by mouth every Monday, Wednesday, and Friday.    Yes [provider]  ALPRAZolam (XANAX) 0.5 MG tablet Take 1 tablet (0.5 mg total) by mouth 2 (two) times daily as needed for anxiety. 07/22/17   Mosie Lukes, MD  Ferrous Fumarate-Folic Acid 827-0 MG TABS Take 1 mg by mouth daily. Patient not taking: Reported on 09/28/2017 07/27/17   Mosie Lukes, MD  fluticasone (FLONASE) 50 MCG/ACT nasal spray PLACE 2 SPRAYS INTO BOTH NOSTRILS DAILY. Patient not taking: Reported on 09/28/2017 09/02/16   Mosie Lukes, MD  HYDROcodone-homatropine [facility]) 5-1.5 MG/5ML syrup Take 5 mLs by mouth every 6 (six) hours as needed for cough. Patient not taking: Reported on 09/28/2017 08/30/16   Blanchie Dessert, MD  hyoscyamine (LEVSIN SL) 0.125 MG SL tablet Place 1 tablet (0.125 mg total) under the tongue every 4 (four) hours as needed. Patient taking differently: Place 0.125 mg under the tongue every 4 (four) hours as needed for cramping.  07/24/17   Mosie Lukes, MD  omeprazole (PRILOSEC) 20 MG capsule Take 1 capsule (20 mg total) by mouth daily. Patient not taking: Reported on 09/28/2017 01/23/17   Dorie Rank, MD    Current Facility-Administered Medications  Medication Dose Route Frequency Provider  Last Rate Last Dose  . 0.9 %  sodium chloride infusion   Intravenous Continuous Esterwood, Amy S, PA-C      . lactated ringers infusion   Intravenous Continuous Gatha Mayer, MD 10 mL/hr at 10/05/17 1149 1,000 mL at 10/05/17 1149    Allergies as of 08/31/2017  . (No Known Allergies)    Family History  Problem Relation Age of Onset  . Stroke Mother   . Aneurysm Mother        brain  . Arthritis Father   . Liver cancer Father        liver, atomic testing in [employer]  . Gallbladder disease Father   . Hyperlipidemia Sister   . Hypertension Sister   . Obesity Sister   . Diabetes Sister   . Diabetes Brother   . Hyperlipidemia Sister   . Hypertension Sister   . Obesity Sister   . Diabetes Sister   . Aneurysm Brother  brain  . COPD Brother   . Gallbladder disease Sister        x4  . Colon cancer Neg Hx   . Colon polyps Neg Hx   . Esophageal cancer Neg Hx   . Rectal cancer Neg Hx   . Stomach cancer Neg Hx     Social History   Socioeconomic History  . Marital status: Married    Spouse name: Not on file  . Number of children: 1  . Years of education: Not on file  . Highest education level: Not on file  Occupational History  . Occupation: Retired    Fish farm manager: [employer]  . Financial resource strain: Not on file  . Food insecurity:    Worry: Not on file    Inability: Not on file  . Transportation needs:    Medical: Not on file    Non-medical: Not on file  Tobacco Use  . Smoking status: Never Smoker  . Smokeless tobacco: Never Used  Substance and Sexual Activity  . Alcohol use: Yes    Alcohol/week: 1.2 oz    Types: 2 Standard drinks or equivalent per week    Comment: Occassionally  . Drug use: No  . Sexual activity: Yes  Lifestyle  . Physical activity:    Days per week: Not on file    Minutes per session: Not on file  . Stress: Not on file  Relationships  . Social connections:    Talks on phone: Not on file    Gets together:  Not on file    Attends religious service: Not on file    Active member of club or organization: Not on file    Attends meetings of clubs or organizations: Not on file    Relationship status: Not on file  . Intimate partner violence:    Fear of current or ex partner: Not on file    Emotionally abused: Not on file    Physically abused: Not on file    Forced sexual activity: Not on file  Other Topics Concern  . Not on file  Social History Narrative  . Not on file    Review of Systems: All other review of systems negative except as mentioned in the HPI. Also co symptomatic umbilical hernia  Physical Exam: Vital signs in last 24 hours: Temp:  [98.2 F (36.8 C)] 98.2 F (36.8 C) (04/08 1136) Pulse Rate:  [64] 64 (04/08 1136) Resp:  [13] 13 (04/08 1136) BP: (155)/(75) 155/75 (04/08 1136) SpO2:  [99 %] 99 % (04/08 1136) Weight:  [212 lb (96.2 kg)] 212 lb (96.2 kg) (04/08 1136)   General:   Alert,  Well-developed, well-nourished, pleasant and cooperative in NAD Lungs:  Clear throughout to auscultation.   Heart:  Regular rate and rhythm; no murmurs, clicks, rubs,  or gallops. Abdomen:  Soft, nontender and nondistended. Normal bowel sounds.  Small herniation of umbilicus nontender Neuro/Psych:  Alert and cooperative. Normal mood and affect. A and O x 3   '@Gee Habig'  Simonne Maffucci, MD, [facility] [phone] (pager) 10/05/2017 11:56 AM@

## 2017-10-05 NOTE — Op Note (Signed)
[facility] Patient Name: [name] Procedure Date: 10/05/2017 MRN: [id number] Attending MD: Gatha Mayer , MD Date of Birth: 1945-07-17 CSN: [id number] Age: 72 Admit Type: Outpatient Procedure:                Small bowel enteroscopy Indications:              Abnormal abdominal CT Thickened proximal jejunum Providers:                Gatha Mayer, MD, Presley Raddle, RN, Laurena Spies, Technician, Courtney Heys. [place], CRNA Referring MD:              Medicines:                Propofol per Anesthesia, Monitored Anesthesia Care Complications:            No immediate complications. Estimated Blood Loss:     Estimated blood loss was minimal. Procedure:                Pre-Anesthesia Assessment:                           - Prior to the procedure, a History and Physical                            was performed, and patient medications and                            allergies were reviewed. The patient's tolerance of                            previous anesthesia was also reviewed. The risks                            and benefits of the procedure and the sedation                            options and risks were discussed with the patient.                            All questions were answered, and informed consent                            was obtained. Prior Anticoagulants: The patient has                            taken no previous anticoagulant or antiplatelet                            agents. ASA Grade Assessment: II - A patient with                            mild systemic disease. After reviewing the risks  and benefits, the patient was deemed in                            satisfactory condition to undergo the procedure.                           After obtaining informed consent, the endoscope was                            passed under direct vision. Throughout the                            procedure,  the patient's blood pressure, pulse, and                            oxygen saturations were monitored continuously. The                            [id number] ([id number]) scope was introduced through                            the mouth and advanced to the proximal jejunum. The                            small bowel enteroscopy was accomplished without                            difficulty. The patient tolerated the procedure                            well. Scope In: Scope Out: Findings:      There was no evidence of significant pathology in the proximal jejunum.       Biopsies were taken with a cold forceps for histology. Verification of       patient identification for the specimen was done. Estimated blood loss       was minimal.      There was no evidence of significant pathology in the entire examined       duodenum.      The esophagus was normal.      The stomach was normal. Impression:               - The examined portion of the jejunum was normal.                            Biopsied.                           - Normal examined duodenum.                           - Normal esophagus.                           - Normal stomach. Recommendation:           - Patient has a contact number available for  emergencies. The signs and symptoms of potential                            delayed complications were discussed with the                            patient. Return to normal activities tomorrow.                            Written discharge instructions were provided to the                            patient.                           - Resume previous diet.                           - Await pathology results.                           - Consider DC ACE inhibitor pending path review - ?                            if he has had ACEI angioedema of intestine Procedure Code(s):        --- Professional ---                           [phone], Small intestinal endoscopy,  enteroscopy                            beyond second portion of duodenum, not including                            ileum; with biopsy, single or multiple Diagnosis Code(s):        --- Professional ---                           R93.3, Abnormal findings on diagnostic imaging of                            other parts of digestive tract CPT copyright 2017 American Medical Association. All rights reserved. The codes documented in this report are preliminary and upon coder review may  be revised to meet current compliance requirements. Gatha Mayer, MD 10/05/2017 12:33:57 PM This report has been signed electronically. Number of Addenda: 0

## 2017-10-07 ENCOUNTER — Encounter (HOSPITAL_COMMUNITY): Payer: Self-pay | Admitting: Internal Medicine

## 2017-10-07 NOTE — Progress Notes (Signed)
Small bowel biopsies are normal  Tell patient I think it would be best to change blood pressure medicine and stop the benazepril (he is on amlodipine-benazepril combo) as one possibility he has had these recurrent problems is angioedema (swelling of the bowel wall) from the benazepril - an ACE inhibitor.   I have cced Dr. Charlett Blake - his PCP to see this and consider making the medication change away from an ACE inhibitor. An angiotensin II inhibitor is probably ok if desired  See me prn

## 2017-10-08 NOTE — Progress Notes (Signed)
ARB should be ok

## 2017-10-11 ENCOUNTER — Other Ambulatory Visit: Payer: Self-pay | Admitting: Family Medicine

## 2017-10-12 ENCOUNTER — Other Ambulatory Visit: Payer: Self-pay

## 2017-10-12 MED ORDER — LOSARTAN POTASSIUM 50 MG PO TABS: 50.0000 mg | ORAL_TABLET | Freq: Every day | ORAL | 3 refills | Status: DC

## 2017-10-12 MED ORDER — AMLODIPINE BESYLATE 5 MG PO TABS: 5.0000 mg | ORAL_TABLET | Freq: Every day | ORAL | 3 refills | Status: DC

## 2017-10-12 MED FILL — LOSARTAN POTASSIUM 50 MG TA: 50 | 30 days supply | Qty: 30 | Fill #0

## 2017-10-12 MED FILL — AMLODIPINE BESYLATE 5 MG TA: 5 | 30 days supply | Qty: 30 | Fill #0

## 2017-10-27 ENCOUNTER — Ambulatory Visit (INDEPENDENT_AMBULATORY_CARE_PROVIDER_SITE_OTHER): Payer: Medicare Other | Admitting: Family Medicine

## 2017-10-27 ENCOUNTER — Ambulatory Visit (HOSPITAL_BASED_OUTPATIENT_CLINIC_OR_DEPARTMENT_OTHER)
Admission: RE | Admit: 2017-10-27 | Discharge: 2017-10-27 | Disposition: A | Discharge status: Home / Self Care | Payer: Medicare Other | Source: Ambulatory Visit | Attending: Family Medicine | Admitting: Family Medicine

## 2017-10-27 ENCOUNTER — Encounter: Payer: Self-pay | Admitting: Family Medicine

## 2017-10-27 VITALS — BP 135/86 | HR 70 | Temp 97.9°F | Resp 16 | Ht 69.29 in | Wt 212.0 lb

## 2017-10-27 DIAGNOSIS — M25511 Pain in right shoulder: Secondary | ICD-10-CM | POA: Diagnosis not present

## 2017-10-27 DIAGNOSIS — M25512 Pain in left shoulder: Secondary | ICD-10-CM | POA: Insufficient documentation

## 2017-10-27 DIAGNOSIS — I10 Essential (primary) hypertension: Secondary | ICD-10-CM | POA: Diagnosis not present

## 2017-10-27 DIAGNOSIS — M545 Low back pain: Secondary | ICD-10-CM | POA: Diagnosis not present

## 2017-10-27 DIAGNOSIS — M4186 Other forms of scoliosis, lumbar region: Secondary | ICD-10-CM | POA: Insufficient documentation

## 2017-10-27 DIAGNOSIS — M19012 Primary osteoarthritis, left shoulder: Secondary | ICD-10-CM | POA: Diagnosis not present

## 2017-10-27 DIAGNOSIS — E785 Hyperlipidemia, unspecified: Secondary | ICD-10-CM

## 2017-10-27 DIAGNOSIS — E669 Obesity, unspecified: Secondary | ICD-10-CM

## 2017-10-27 DIAGNOSIS — G8929 Other chronic pain: Secondary | ICD-10-CM

## 2017-10-27 MED ORDER — LOSARTAN POTASSIUM 50 MG PO TABS
50.0000 mg | ORAL_TABLET | Freq: Every day | ORAL | 1 refills | Status: DC
Start: 2017-10-27 — End: 2018-04-07

## 2017-10-27 MED ORDER — ALPRAZOLAM 0.5 MG PO TABS: 0.5000 mg | ORAL_TABLET | Freq: Two times a day (BID) | ORAL | 1 refills | Status: DC | PRN

## 2017-10-27 MED ORDER — TIZANIDINE HCL 4 MG PO TABS: 4.0000 mg | ORAL_TABLET | Freq: Two times a day (BID) | ORAL | 1 refills | Status: DC | PRN

## 2017-10-27 MED ORDER — AMLODIPINE BESYLATE 5 MG PO TABS
5.0000 mg | ORAL_TABLET | Freq: Every day | ORAL | 1 refills | Status: DC
Start: 2017-10-27 — End: 2018-04-07

## 2017-10-27 MED ORDER — METOPROLOL SUCCINATE ER 50 MG PO TB24: 50.0000 mg | ORAL_TABLET | Freq: Every day | ORAL | 1 refills | Status: DC

## 2017-10-27 MED FILL — ALPRAZolam 0.5 MG TABS: 0.5 | 5 days supply | Qty: 10 | Fill #0

## 2017-10-27 NOTE — Progress Notes (Signed)
Subjective:  I acted as a Education administrator for [employer]. [name], [place]   Patient ID: [name], male    DOB: [birth year], 72 y.o.   MRN: [id number]  Chief Complaint  Patient presents with  . Follow-up    HPI  Patient is in today for follow up visit and his greatest complaints are musculoskeletal in nature. He notes increased pain and decreased range of motion in both shoulders but no falls or injury. He also notes increased low back pain as well. No radiculopathy or incontinence. Denies CP/palp/SOB/HA/congestion/fevers/GI or GU c/o. Taking meds as prescribed  Patient Care Team: Mosie Lukes, MD as PCP - General (Family Medicine)   Past Medical History:  Diagnosis Date  . Abnormal CT scan, gastrointestinal tract suspect angioedema   . Anemia 03/16/2016  . Anxiety   . Arthritis   . Back pain 10/10/2012  . Fatty liver 2005   seen on 2005 ultrasound,05/2015 CT.   Marland Kitchen Hemorrhoids 08/22/2016  . Hemorrhoids, internal, with bleeding 2005   internal and external. 2005 band ligation (dr Ferdinand Lango in [facility])  . Hyperglycemia 08/21/2013  . Hyperlipidemia   . Hypertension   . Nocturia 08/21/2013  . Obesity (BMI 30.0-34.9)   . Osteoporosis   . Pneumonia    as a child  . Rectal bleeding 07/30/2014  . Renal cyst   . SBO (small bowel obstruction) ([place])   . 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 Unspecified constipation 02/06/2014  . Vitamin D deficiency     Past Surgical History:  Procedure Laterality Date  . COLONOSCOPY  before 2005, 2012, 2013, 08/2014.   Marland Kitchen ENTEROSCOPY N/A 10/05/2017   Procedure: ENTEROSCOPY;  Surgeon: Gatha Mayer, MD;  Location: [facility];  Service: Endoscopy;  Laterality: N/A;  . FRACTURE SURGERY Left 1989   leg  . HEMORRHOID BANDING  2005  . KNEE SURGERY Left 2010   arthroscopy, torn carilage  . KNEE SURGERY Right 2012   arthroscopy  . TONSILLECTOMY  1957    Family History  Problem Relation Age of Onset  . Stroke Mother   . Aneurysm Mother          brain  . Arthritis Father   . Liver cancer Father        liver, atomic testing in [employer]  . Gallbladder disease Father   . Hyperlipidemia Sister   . Hypertension Sister   . Obesity Sister   . Diabetes Sister   . Diabetes Brother   . Hyperlipidemia Sister   . Hypertension Sister   . Obesity Sister   . Diabetes Sister   . Aneurysm Brother        brain  . COPD Brother   . Gallbladder disease Sister        x4  . Colon cancer Neg Hx   . Colon polyps Neg Hx   . Esophageal cancer Neg Hx   . Rectal cancer Neg Hx   . Stomach cancer Neg Hx     Social History   Socioeconomic History  . Marital status: Married    Spouse name: Not on file  . Number of children: 1  . Years of education: Not on file  . Highest education level: Not on file  Occupational History  . Occupation: Retired    Fish farm manager: [employer]  . Financial resource strain: Not on file  . Food insecurity:    Worry: Not on file    Inability: Not on file  .  Transportation needs:    Medical: Not on file    Non-medical: Not on file  Tobacco Use  . Smoking status: Never Smoker  . Smokeless tobacco: Never Used  Substance and Sexual Activity  . Alcohol use: Yes    Alcohol/week: 1.2 oz    Types: 2 Standard drinks or equivalent per week    Comment: Occassionally  . Drug use: No  . Sexual activity: Yes  Lifestyle  . Physical activity:    Days per week: Not on file    Minutes per session: Not on file  . Stress: Not on file  Relationships  . Social connections:    Talks on phone: Not on file    Gets together: Not on file    Attends religious service: Not on file    Active member of club or organization: Not on file    Attends meetings of clubs or organizations: Not on file    Relationship status: Not on file  . Intimate partner violence:    Fear of current or ex partner: Not on file    Emotionally abused: Not on file    Physically abused: Not on file    Forced sexual activity:  Not on file  Other Topics Concern  . Not on file  Social History Narrative   Married - 1 child   Retired from [employer], no tbacco/drug use    Outpatient Medications Prior to Visit  Medication Sig Dispense Refill  . azelastine (OPTIVAR) 0.05 % ophthalmic solution Place 1 drop into the left eye 2 (two) times daily. (Patient taking differently: Place 1 drop into both eyes 2 (two) times daily as needed (for itching). ) 6 mL 1  . Ferrous Fumarate-Folic Acid 756-4 MG TABS Take 1 mg by mouth daily. 30 each 1  . hydrocortisone (HEMMOREX-HC) 25 MG suppository Place 25 mg rectally 2 (two) times daily as needed for hemorrhoids or anal itching.    . hyoscyamine (LEVSIN SL) 0.125 MG SL tablet Place 1 tablet (0.125 mg total) under the tongue every 4 (four) hours as needed. (Patient taking differently: Place 0.125 mg under the tongue every 4 (four) hours as needed for cramping. ) 30 tablet 1  . ibuprofen (ADVIL,MOTRIN) 400 MG tablet Take 1 tablet (400 mg total) by mouth every 8 (eight) hours as needed. (Patient taking differently: Take 400 mg by mouth every 8 (eight) hours as needed for headache or moderate pain. ) 40 tablet 3  . Multiple Vitamins-Minerals (MULTIVITAMIN ADULT PO) Take 1 tablet by mouth daily.    . Pitavastatin Calcium (LIVALO) 2 MG TABS Take 2 mg by mouth daily 90 tablet 1  . polyethylene glycol (MIRALAX / GLYCOLAX) packet Take 17 g by mouth every Monday, Wednesday, and Friday.     . ALPRAZolam (XANAX) 0.5 MG tablet Take 1 tablet (0.5 mg total) by mouth 2 (two) times daily as needed for anxiety. 10 tablet 0  . amLODipine (NORVASC) 5 MG tablet Take 1 tablet (5 mg total) by mouth daily. 30 tablet 3  . losartan (COZAAR) 50 MG tablet Take 1 tablet (50 mg total) by mouth daily. 50 tablet 3  . metoprolol tartrate (LOPRESSOR) 50 MG tablet TAKE 1 TABLET DAILY (Patient taking differently: Take 50 mg by mouth daily. ) 90 tablet 2   No facility-administered medications prior to visit.      No Known Allergies  Review of Systems  Constitutional: Negative for fever and malaise/fatigue.  HENT: Negative for congestion.  Eyes: Negative for blurred vision.  Respiratory: Negative for cough and shortness of breath.   Cardiovascular: Negative for chest pain, palpitations and leg swelling.  Gastrointestinal: Negative for vomiting.  Musculoskeletal: Positive for back pain and joint pain.  Skin: Negative for rash.  Neurological: Negative for loss of consciousness and headaches.       Objective:    Physical Exam  Constitutional: He is oriented to person, place, and time. No distress.  HENT:  Head: Normocephalic and atraumatic.  Eyes: Conjunctivae are normal.  Neck: Neck supple. No thyromegaly present.  Cardiovascular: Normal rate, regular rhythm and normal heart sounds.  No murmur heard. Pulmonary/Chest: Effort normal and breath sounds normal. No respiratory distress.  Abdominal: He exhibits no distension and no mass. There is no tenderness.  Musculoskeletal: He exhibits no edema.  Decrease ROM b/l shoulders  Neurological: He is alert and oriented to person, place, and time.  Skin: Skin is warm.  Psychiatric: Judgment normal.    BP 135/86 (BP Location: Left Arm, Patient Position: Sitting, Cuff Size: Large)   Pulse 70   Temp 97.9 F (36.6 C) (Oral)   Resp 16   Ht 5' 9.29" (1.76 m)   Wt 212 lb (96.2 kg)   SpO2 100%   BMI 31.04 kg/m  Wt Readings from Last 3 Encounters:  10/27/17 212 lb (96.2 kg)  10/05/17 212 lb (96.2 kg)  08/31/17 219 lb (99.3 kg)   BP Readings from Last 3 Encounters:  10/27/17 135/86  10/05/17 118/69  08/31/17 132/86     Immunization History  Administered Date(s) Administered  . Influenza Whole 04/12/2013  . Influenza, High Dose Seasonal PF 03/11/2016, 04/07/2017  . Influenza,inj,Quad PF,6+ Mos 04/12/2015  . Influenza-Unspecified 04/13/2014  . Pneumococcal Conjugate-13 08/18/2013  . Pneumococcal Polysaccharide-23 02/19/2016  .  Tdap 06/30/2010  . Zoster 06/30/2010    Health Maintenance  Topic Date Due  . INFLUENZA VACCINE  01/28/2018  . COLONOSCOPY  08/26/2019  . TETANUS/TDAP  06/30/2020  . Hepatitis C Screening  Completed  . PNA vac Low Risk Adult  Completed    Lab Results  Component Value Date   WBC 4.3 07/24/2017   HGB 13.1 07/24/2017   HCT 39.4 07/24/2017   PLT 211.0 07/24/2017   GLUCOSE 102 (H) 07/24/2017   CHOL 140 07/24/2017   TRIG 73.0 07/24/2017   HDL 35.80 (L) 07/24/2017   LDLCALC 89 07/24/2017   ALT 19 07/24/2017   AST 18 07/24/2017   NA 140 07/24/2017   K 4.2 07/24/2017   CL 106 07/24/2017   CREATININE 1.06 07/24/2017   BUN 12 07/24/2017   CO2 28 07/24/2017   TSH 0.80 07/24/2017   PSA 1.08 08/07/2014   HGBA1C 6.0 08/07/2014    Lab Results  Component Value Date   TSH 0.80 07/24/2017   Lab Results  Component Value Date   WBC 4.3 07/24/2017   HGB 13.1 07/24/2017   HCT 39.4 07/24/2017   MCV 84.8 07/24/2017   PLT 211.0 07/24/2017   Lab Results  Component Value Date   NA 140 07/24/2017   K 4.2 07/24/2017   CO2 28 07/24/2017   GLUCOSE 102 (H) 07/24/2017   BUN 12 07/24/2017   CREATININE 1.06 07/24/2017   BILITOT 0.5 07/24/2017   ALKPHOS 75 07/24/2017   AST 18 07/24/2017   ALT 19 07/24/2017   PROT 7.1 07/24/2017   ALBUMIN 4.0 07/24/2017   CALCIUM 9.1 07/24/2017   ANIONGAP 11 01/23/2017   GFR 88.98 07/24/2017   Lab  Results  Component Value Date   CHOL 140 07/24/2017   Lab Results  Component Value Date   HDL 35.80 (L) 07/24/2017   Lab Results  Component Value Date   LDLCALC 89 07/24/2017   Lab Results  Component Value Date   TRIG 73.0 07/24/2017   Lab Results  Component Value Date   CHOLHDL 4 07/24/2017   Lab Results  Component Value Date   HGBA1C 6.0 08/07/2014         Assessment & Plan:   Problem List Items Addressed This Visit    Hyperlipidemia    Tolerating statin, encouraged heart healthy diet, avoid trans fats, minimize simple carbs and  saturated fats. Increase exercise as tolerated      Relevant Medications   losartan (COZAAR) 50 MG tablet   amLODipine (NORVASC) 5 MG tablet   metoprolol succinate (TOPROL-XL) 50 MG 24 hr tablet   Hypertension, essential     Well controlled, no changes to meds. Encouraged heart healthy diet such as the DASH diet and exercise as tolerated.       Relevant Medications   losartan (COZAAR) 50 MG tablet   amLODipine (NORVASC) 5 MG tablet   metoprolol succinate (TOPROL-XL) 50 MG 24 hr tablet   Obesity (BMI 30.0-34.9)    Encouraged DASH diet, decrease po intake and increase exercise as tolerated. Needs 7-8 hours of sleep nightly. Avoid trans fats, eat small, frequent meals every 4-5 hours with lean proteins, complex carbs and healthy fats. Minimize simple carbs      Low back pain    Encouraged moist heat and gentle stretching as tolerated. May try NSAIDs and prescription meds as directed and report if symptoms worsen or seek immediate care. Referred to ortho      Relevant Medications   tiZANidine (ZANAFLEX) 4 MG tablet   Other Relevant Orders   DG Lumbar Spine Complete (Completed)   Ambulatory referral to Orthopedic Surgery   Right shoulder pain    Encouraged moist heat and gentle stretching as tolerated. May try NSAIDs and prescription meds as directed and report if symptoms worsen or seek immediate care. Referred to orthopaedics for further consideration       Other Visit Diagnoses    Chronic left shoulder pain    -  Primary   Relevant Medications   tiZANidine (ZANAFLEX) 4 MG tablet   Other Relevant Orders   DG Shoulder Left (Completed)   Ambulatory referral to Orthopedic Surgery      I have discontinued Tayari Sartwell's metoprolol tartrate. I am also having him start on tiZANidine and metoprolol succinate. Additionally, I am having him maintain his azelastine, ibuprofen, hyoscyamine, Ferrous Fumarate-Folic Acid, polyethylene glycol, hydrocortisone, Multiple Vitamins-Minerals  (MULTIVITAMIN ADULT PO), Pitavastatin Calcium, losartan, amLODipine, and ALPRAZolam.  Meds ordered this encounter  Medications  . losartan (COZAAR) 50 MG tablet    Sig: Take 1 tablet (50 mg total) by mouth daily.    Dispense:  90 tablet    Refill:  1  . amLODipine (NORVASC) 5 MG tablet    Sig: Take 1 tablet (5 mg total) by mouth daily.    Dispense:  90 tablet    Refill:  1  . tiZANidine (ZANAFLEX) 4 MG tablet    Sig: Take 1 tablet (4 mg total) by mouth 2 (two) times daily as needed for muscle spasms.    Dispense:  30 tablet    Refill:  1  . metoprolol succinate (TOPROL-XL) 50 MG 24 hr tablet    Sig: Take  1 tablet (50 mg total) by mouth daily. Take with or immediately following a meal.    Dispense:  90 tablet    Refill:  1  . ALPRAZolam (XANAX) 0.5 MG tablet    Sig: Take 1 tablet (0.5 mg total) by mouth 2 (two) times daily as needed for anxiety.    Dispense:  10 tablet    Refill:  1    CMA served as scribe during this visit. History, Physical and Plan performed by medical provider. Documentation and orders reviewed and attested to.  Penni Homans, MD

## 2017-10-27 NOTE — Patient Instructions (Addendum)
Shingrix is the new shingles shot. 2 shots over 2-6 months at pharmacy  Hypertension Hypertension, commonly called high blood pressure, is when the force of blood pumping through the arteries is too strong. The arteries are the blood vessels that carry blood from the heart throughout the body. Hypertension forces the heart to work harder to pump blood and may cause arteries to become narrow or stiff. Having untreated or uncontrolled hypertension can cause heart attacks, strokes, kidney disease, and other problems. A blood pressure reading consists of a higher number over a lower number. Ideally, your blood pressure should be below 120/80. The first ("top") number is called the systolic pressure. It is a measure of the pressure in your arteries as your heart beats. The second ("bottom") number is called the diastolic pressure. It is a measure of the pressure in your arteries as the heart relaxes. What are the causes? The cause of this condition is not known. What increases the risk? Some risk factors for high blood pressure are under your control. Others are not. Factors you can change  Smoking.  Having type 2 diabetes mellitus, high cholesterol, or both.  Not getting enough exercise or physical activity.  Being overweight.  Having too much fat, sugar, calories, or salt (sodium) in your diet.  Drinking too much alcohol. Factors that are difficult or impossible to change  Having chronic kidney disease.  Having a family history of high blood pressure.  Age. Risk increases with age.  Race. You may be at higher risk if you are African-American.  Gender. Men are at higher risk than women before age 25. After age 55, women are at higher risk than men.  Having obstructive sleep apnea.  Stress. What are the signs or symptoms? Extremely high blood pressure (hypertensive crisis) may cause:  Headache.  Anxiety.  Shortness of breath.  Nosebleed.  Nausea and vomiting.  Severe  chest pain.  Jerky movements you cannot control (seizures).  How is this diagnosed? This condition is diagnosed by measuring your blood pressure while you are seated, with your arm resting on a surface. The cuff of the blood pressure monitor will be placed directly against the skin of your upper arm at the level of your heart. It should be measured at least twice using the same arm. Certain conditions can cause a difference in blood pressure between your right and left arms. Certain factors can cause blood pressure readings to be lower or higher than normal (elevated) for a short period of time:  When your blood pressure is higher when you are in a health care provider's office than when you are at home, this is called white coat hypertension. Most people with this condition do not need medicines.  When your blood pressure is higher at home than when you are in a health care provider's office, this is called masked hypertension. Most people with this condition may need medicines to control blood pressure.  If you have a high blood pressure reading during one visit or you have normal blood pressure with other risk factors:  You may be asked to return on a different day to have your blood pressure checked again.  You may be asked to monitor your blood pressure at home for 1 week or longer.  If you are diagnosed with hypertension, you may have other blood or imaging tests to help your health care provider understand your overall risk for other conditions. How is this treated? This condition is treated by making healthy lifestyle  changes, such as eating healthy foods, exercising more, and reducing your alcohol intake. Your health care provider may prescribe medicine if lifestyle changes are not enough to get your blood pressure under control, and if:  Your systolic blood pressure is above 130.  Your diastolic blood pressure is above 80.  Your personal target blood pressure may vary depending on  your medical conditions, your age, and other factors. Follow these instructions at home: Eating and drinking  Eat a diet that is high in fiber and potassium, and low in sodium, added sugar, and fat. An example eating plan is called the DASH (Dietary Approaches to Stop Hypertension) diet. To eat this way: ? Eat plenty of fresh fruits and vegetables. Try to fill half of your plate at each meal with fruits and vegetables. ? Eat whole grains, such as whole wheat pasta, brown rice, or whole grain bread. Fill about one quarter of your plate with whole grains. ? Eat or drink low-fat dairy products, such as skim milk or low-fat yogurt. ? Avoid fatty cuts of meat, processed or cured meats, and poultry with skin. Fill about one quarter of your plate with lean proteins, such as fish, chicken without skin, beans, eggs, and tofu. ? Avoid premade and processed foods. These tend to be higher in sodium, added sugar, and fat.  Reduce your daily sodium intake. Most people with hypertension should eat less than 1,500 mg of sodium a day.  Limit alcohol intake to no more than 1 drink a day for nonpregnant women and 2 drinks a day for men. One drink equals 12 oz of beer, 5 oz of wine, or 1 oz of hard liquor. Lifestyle  Work with your health care provider to maintain a healthy body weight or to lose weight. Ask what an ideal weight is for you.  Get at least 30 minutes of exercise that causes your heart to beat faster (aerobic exercise) most days of the week. Activities may include walking, swimming, or biking.  Include exercise to strengthen your muscles (resistance exercise), such as pilates or lifting weights, as part of your weekly exercise routine. Try to do these types of exercises for 30 minutes at least 3 days a week.  Do not use any products that contain nicotine or tobacco, such as cigarettes and e-cigarettes. If you need help quitting, ask your health care provider.  Monitor your blood pressure at home  as told by your health care provider.  Keep all follow-up visits as told by your health care provider. This is important. Medicines  Take over-the-counter and prescription medicines only as told by your health care provider. Follow directions carefully. Blood pressure medicines must be taken as prescribed.  Do not skip doses of blood pressure medicine. Doing this puts you at risk for problems and can make the medicine less effective.  Ask your health care provider about side effects or reactions to medicines that you should watch for. Contact a health care provider if:  You think you are having a reaction to a medicine you are taking.  You have headaches that keep coming back (recurring).  You feel dizzy.  You have swelling in your ankles.  You have trouble with your vision. Get help right away if:  You develop a severe headache or confusion.  You have unusual weakness or numbness.  You feel faint.  You have severe pain in your chest or abdomen.  You vomit repeatedly.  You have trouble breathing. Summary  Hypertension is when the force  of blood pumping through your arteries is too strong. If this condition is not controlled, it may put you at risk for serious complications.  Your personal target blood pressure may vary depending on your medical conditions, your age, and other factors. For most people, a normal blood pressure is less than 120/80.  Hypertension is treated with lifestyle changes, medicines, or a combination of both. Lifestyle changes include weight loss, eating a healthy, low-sodium diet, exercising more, and limiting alcohol. This information is not intended to replace advice given to you by your health care provider. Make sure you discuss any questions you have with your health care provider. Document Released: 06/16/2005 Document Revised: 05/14/2016 Document Reviewed: 05/14/2016 Elsevier Interactive Patient Education  [employer].

## 2017-11-01 NOTE — Assessment & Plan Note (Signed)
Well controlled, no changes to meds. Encouraged heart healthy diet such as the DASH diet and exercise as tolerated.  °

## 2017-11-01 NOTE — Assessment & Plan Note (Signed)
Encouraged DASH diet, decrease po intake and increase exercise as tolerated. Needs 7-8 hours of sleep nightly. Avoid trans fats, eat small, frequent meals every 4-5 hours with lean proteins, complex carbs and healthy fats. Minimize simple carbs 

## 2017-11-01 NOTE — Assessment & Plan Note (Signed)
Encouraged moist heat and gentle stretching as tolerated. May try NSAIDs and prescription meds as directed and report if symptoms worsen or seek immediate care. Referred to ortho

## 2017-11-01 NOTE — Assessment & Plan Note (Signed)
Tolerating statin, encouraged heart healthy diet, avoid trans fats, minimize simple carbs and saturated fats. Increase exercise as tolerated 

## 2017-11-01 NOTE — Assessment & Plan Note (Addendum)
Encouraged moist heat and gentle stretching as tolerated. May try NSAIDs and prescription meds as directed and report if symptoms worsen or seek immediate care. Referred to orthopaedics for further consideration

## 2017-11-03 DIAGNOSIS — M5136 Other intervertebral disc degeneration, lumbar region: Secondary | ICD-10-CM | POA: Diagnosis not present

## 2017-11-11 DIAGNOSIS — M545 Low back pain: Secondary | ICD-10-CM | POA: Diagnosis not present

## 2017-11-16 DIAGNOSIS — N401 Enlarged prostate with lower urinary tract symptoms: Secondary | ICD-10-CM | POA: Diagnosis not present

## 2017-11-16 DIAGNOSIS — R351 Nocturia: Secondary | ICD-10-CM | POA: Diagnosis not present

## 2017-11-16 DIAGNOSIS — M5136 Other intervertebral disc degeneration, lumbar region: Secondary | ICD-10-CM | POA: Diagnosis not present

## 2017-11-27 DIAGNOSIS — M5137 Other intervertebral disc degeneration, lumbosacral region: Secondary | ICD-10-CM | POA: Diagnosis not present

## 2017-12-14 DIAGNOSIS — M545 Low back pain: Secondary | ICD-10-CM | POA: Diagnosis not present

## 2017-12-22 DIAGNOSIS — M545 Low back pain: Secondary | ICD-10-CM | POA: Diagnosis not present

## 2017-12-30 DIAGNOSIS — M5136 Other intervertebral disc degeneration, lumbar region: Secondary | ICD-10-CM | POA: Diagnosis not present

## 2018-01-06 DIAGNOSIS — M5136 Other intervertebral disc degeneration, lumbar region: Secondary | ICD-10-CM | POA: Diagnosis not present

## 2018-01-26 ENCOUNTER — Ambulatory Visit: Payer: Medicare Other | Admitting: Family Medicine

## 2018-01-29 ENCOUNTER — Ambulatory Visit (INDEPENDENT_AMBULATORY_CARE_PROVIDER_SITE_OTHER): Payer: Medicare Other | Admitting: Family Medicine

## 2018-01-29 ENCOUNTER — Encounter: Payer: Self-pay | Admitting: Family Medicine

## 2018-01-29 VITALS — BP 118/82 | HR 60 | Temp 98.3°F | Resp 18 | Ht 70.0 in | Wt 209.0 lb

## 2018-01-29 DIAGNOSIS — R109 Unspecified abdominal pain: Secondary | ICD-10-CM | POA: Diagnosis not present

## 2018-01-29 DIAGNOSIS — D649 Anemia, unspecified: Secondary | ICD-10-CM

## 2018-01-29 DIAGNOSIS — I10 Essential (primary) hypertension: Secondary | ICD-10-CM

## 2018-01-29 DIAGNOSIS — R739 Hyperglycemia, unspecified: Secondary | ICD-10-CM

## 2018-01-29 DIAGNOSIS — E785 Hyperlipidemia, unspecified: Secondary | ICD-10-CM | POA: Diagnosis not present

## 2018-01-29 DIAGNOSIS — R1084 Generalized abdominal pain: Secondary | ICD-10-CM | POA: Diagnosis not present

## 2018-01-29 DIAGNOSIS — K649 Unspecified hemorrhoids: Secondary | ICD-10-CM

## 2018-01-29 DIAGNOSIS — M25511 Pain in right shoulder: Secondary | ICD-10-CM

## 2018-01-29 LAB — COMPREHENSIVE METABOLIC PANEL
ALT: 23 U/L (ref 0–53)
AST: 22 U/L (ref 0–37)
Albumin: 4.3 g/dL (ref 3.5–5.2)
Alkaline Phosphatase: 84 U/L (ref 39–117)
BUN: 13 mg/dL (ref 6–23)
CO2: 26 mEq/L (ref 19–32)
Calcium: 9.2 mg/dL (ref 8.4–10.5)
Chloride: 107 mEq/L (ref 96–112)
Creatinine, Ser: 1.22 mg/dL (ref 0.40–1.50)
GFR: 75.54 mL/min (ref >60.00)
Glucose, Bld: 115 mg/dL — ABNORMAL HIGH (ref 70–99)
Potassium: 4.2 mEq/L (ref 3.5–5.1)
Sodium: 141 mEq/L (ref 135–145)
Total Bilirubin: 0.6 mg/dL (ref 0.2–1.2)
Total Protein: 6.8 g/dL (ref 6.0–8.3)

## 2018-01-29 LAB — CBC
HCT: 38.9 % — ABNORMAL LOW (ref 39.0–52.0)
Hemoglobin: 13.2 g/dL (ref 13.0–17.0)
MCHC: 33.9 g/dL (ref 30.0–36.0)
MCV: 85.6 fl (ref 78.0–100.0)
Platelets: 184 10*3/uL (ref 150.0–400.0)
RBC: 4.54 Mil/uL (ref 4.22–5.81)
RDW: 13.2 % (ref 11.5–15.5)
WBC: 4.8 10*3/uL (ref 4.0–10.5)

## 2018-01-29 LAB — LIPID PANEL
Cholesterol: 145 mg/dL (ref 0–200)
HDL: 40.7 mg/dL (ref >39.00)
LDL Cholesterol: 90 mg/dL (ref 0–99)
NonHDL: 104.11
Total CHOL/HDL Ratio: 4
Triglycerides: 69 mg/dL (ref 0.0–149.0)
VLDL: 13.8 mg/dL (ref 0.0–40.0)

## 2018-01-29 LAB — HEMOGLOBIN A1C: Hgb A1c MFr Bld: 6 % (ref 4.6–6.5)

## 2018-01-29 LAB — TSH: TSH: 1.61 u[IU]/mL (ref 0.35–4.50)

## 2018-01-29 MED ORDER — HYDROCORTISONE ACETATE 25 MG RE SUPP: 25.0000 mg | Freq: Two times a day (BID) | RECTAL | 3 refills | Status: DC | PRN

## 2018-01-29 MED ORDER — HYOSCYAMINE SULFATE 0.125 MG SL SUBL: 0.1250 mg | SUBLINGUAL_TABLET | SUBLINGUAL | 1 refills | Status: DC | PRN

## 2018-01-29 MED FILL — HEMMOREX-HC 25 MG SUPP: 25 | 6 days supply | Qty: 12 | Fill #0

## 2018-01-29 MED FILL — OSCIMIN SL 0.125 MG TABLET: 0.125 | 5 days supply | Qty: 30 | Fill #0

## 2018-01-29 NOTE — Assessment & Plan Note (Signed)
Follows with GI has occasional irritation which respond to suppositories, refills given, he will report worsening symptoms

## 2018-01-29 NOTE — Assessment & Plan Note (Signed)
Encouraged heart healthy diet, increase exercise, avoid trans fats, consider a krill oil cap daily 

## 2018-01-29 NOTE — Assessment & Plan Note (Signed)
Improved uses Hyoscyamine infrequently with good results. Refill given today

## 2018-01-29 NOTE — Patient Instructions (Addendum)
Encouraged increased hydration and fiber in diet. Daily probiotics. If bowels not moving can use MOM 2 tbls po in 4 oz of warm prune juice by mouth every 2-3 days. If no results then repeat in 4 hours with  Dulcolax suppository pr, may repeat again in 4 more hours as needed. Seek care if symptoms worsen. Consider daily Miralax and/or Dulcolax if symptoms persist.   Mix Miralax and Benefiber together once and twice daily   Shingrix is the new shingles shot 2 shots over 2-6 months at pharmacy  Constipation, Adult Constipation is when a person:  Poops (has a bowel movement) fewer times in a week than normal.  Has a hard time pooping.  Has poop that is dry, hard, or bigger than normal.  Follow these instructions at home: Eating and drinking   Eat foods that have a lot of fiber, such as: ? Fresh fruits and vegetables. ? Whole grains. ? Beans.  Eat less of foods that are high in fat, low in fiber, or overly processed, such as: ? [place] fries. ? Hamburgers. ? Cookies. ? Candy. ? Soda.  Drink enough fluid to keep your pee (urine) clear or pale yellow. General instructions  Exercise regularly or as told by your doctor.  Go to the restroom when you feel like you need to poop. Do not hold it in.  Take over-the-counter and prescription medicines only as told by your doctor. These include any fiber supplements.  Do pelvic floor retraining exercises, such as: ? Doing deep breathing while relaxing your lower belly (abdomen). ? Relaxing your pelvic floor while pooping.  Watch your condition for any changes.  Keep all follow-up visits as told by your doctor. This is important. Contact a doctor if:  You have pain that gets worse.  You have a fever.  You have not pooped for 4 days.  You throw up (vomit).  You are not hungry.  You lose weight.  You are bleeding from the anus.  You have thin, pencil-like poop (stool). Get help right away if:  You have a fever, and your  symptoms suddenly get worse.  You leak poop or have blood in your poop.  Your belly feels hard or bigger than normal (is bloated).  You have very bad belly pain.  You feel dizzy or you faint. This information is not intended to replace advice given to you by your health care provider. Make sure you discuss any questions you have with your health care provider. Document Released: 12/03/2007 Document Revised: 01/04/2016 Document Reviewed: 12/05/2015 Elsevier Interactive Patient Education  2018 [employer].

## 2018-01-29 NOTE — Assessment & Plan Note (Signed)
Increase leafy greens, consider increased lean red meat and using cast iron cookware. Continue to monitor, report any concerns. Not taking iron presently

## 2018-01-29 NOTE — Progress Notes (Signed)
Subjective:  I acted as a Education administrator for Dr. Charlett Blake. [place], [place]  Patient ID: [name], male    DOB: 04/29/1946, 72 y.o.   MRN: [id number]  No chief complaint on file.   HPI  Patient is in today for A 3 month follow up and he notes feeling well. No recent febrile illness or hospitalizations. No polyuria or polydipsia. He is staying active and trying to maintain a heart healthy diet. He continues to struggle with back anc right shoulder pain, is following with otho and has had physical therapy and steroid shots with only minimal improvement. Denies CP/palp/SOB/HA/congestion/fevers/GI or GU c/o. Taking meds as prescribed. He has abdominal pain less frequently but when he does Hyoscyamine is helpful.  Patient Care Team: Mosie Lukes, MD as PCP - General (Family Medicine)   Past Medical History:  Diagnosis Date  . Abnormal CT scan, gastrointestinal tract suspect angioedema   . Anemia 03/16/2016  . Anxiety   . Arthritis   . Back pain 10/10/2012  . Fatty liver 2005   seen on 2005 ultrasound,05/2015 CT.   Marland Kitchen Hemorrhoids 08/22/2016  . Hemorrhoids, internal, with bleeding 2005   internal and external. 2005 band ligation (dr Ferdinand Lango in [facility])  . Hyperglycemia 08/21/2013  . Hyperlipidemia   . Hypertension   . Nocturia 08/21/2013  . Obesity (BMI 30.0-34.9)   . Osteoporosis   . Pneumonia    as a child  . Rectal bleeding 07/30/2014  . Renal cyst   . SBO (small bowel obstruction) ([place])   . 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 Unspecified constipation 02/06/2014  . Vitamin D deficiency     Past Surgical History:  Procedure Laterality Date  . COLONOSCOPY  before 2005, 2012, 2013, 08/2014.   Marland Kitchen ENTEROSCOPY N/A 10/05/2017   Procedure: ENTEROSCOPY;  Surgeon: Gatha Mayer, MD;  Location: [facility];  Service: Endoscopy;  Laterality: N/A;  . FRACTURE SURGERY Left 1989   leg  . HEMORRHOID BANDING  2005  . KNEE SURGERY Left 2010   arthroscopy, torn carilage  . KNEE  SURGERY Right 2012   arthroscopy  . TONSILLECTOMY  1957    Family History  Problem Relation Age of Onset  . Stroke Mother   . Aneurysm Mother        brain  . Arthritis Father   . Liver cancer Father        liver, atomic testing in [employer]  . Gallbladder disease Father   . Hyperlipidemia Sister   . Hypertension Sister   . Obesity Sister   . Diabetes Sister   . Diabetes Brother   . Hyperlipidemia Sister   . Hypertension Sister   . Obesity Sister   . Diabetes Sister   . Aneurysm Brother        brain  . COPD Brother   . Gallbladder disease Sister        x4  . Colon cancer Neg Hx   . Colon polyps Neg Hx   . Esophageal cancer Neg Hx   . Rectal cancer Neg Hx   . Stomach cancer Neg Hx     Social History   Socioeconomic History  . Marital status: Married    Spouse name: Not on file  . Number of children: 1  . Years of education: Not on file  . Highest education level: Not on file  Occupational History  . Occupation: Retired    Fish farm manager: [employer]  . Financial  resource strain: Not on file  . Food insecurity:    Worry: Not on file    Inability: Not on file  . Transportation needs:    Medical: Not on file    Non-medical: Not on file  Tobacco Use  . Smoking status: Never Smoker  . Smokeless tobacco: Never Used  Substance and Sexual Activity  . Alcohol use: Yes    Alcohol/week: 1.2 oz    Types: 2 Standard drinks or equivalent per week    Comment: Occassionally  . Drug use: No  . Sexual activity: Yes  Lifestyle  . Physical activity:    Days per week: Not on file    Minutes per session: Not on file  . Stress: Not on file  Relationships  . Social connections:    Talks on phone: Not on file    Gets together: Not on file    Attends religious service: Not on file    Active member of club or organization: Not on file    Attends meetings of clubs or organizations: Not on file    Relationship status: Not on file  . Intimate partner  violence:    Fear of current or ex partner: Not on file    Emotionally abused: Not on file    Physically abused: Not on file    Forced sexual activity: Not on file  Other Topics Concern  . Not on file  Social History Narrative   Married - 1 child   Retired from [employer], no tbacco/drug use    Outpatient Medications Prior to Visit  Medication Sig Dispense Refill  . ALPRAZolam (XANAX) 0.5 MG tablet Take 1 tablet (0.5 mg total) by mouth 2 (two) times daily as needed for anxiety. 10 tablet 1  . amLODipine (NORVASC) 5 MG tablet Take 1 tablet (5 mg total) by mouth daily. 90 tablet 1  . azelastine (OPTIVAR) 0.05 % ophthalmic solution Place 1 drop into the left eye 2 (two) times daily. (Patient taking differently: Place 1 drop into both eyes 2 (two) times daily as needed (for itching). ) 6 mL 1  . Ferrous Fumarate-Folic Acid 599-3 MG TABS Take 1 mg by mouth daily. 30 each 1  . ibuprofen (ADVIL,MOTRIN) 400 MG tablet Take 1 tablet (400 mg total) by mouth every 8 (eight) hours as needed. (Patient taking differently: Take 400 mg by mouth every 8 (eight) hours as needed for headache or moderate pain. ) 40 tablet 3  . losartan (COZAAR) 50 MG tablet Take 1 tablet (50 mg total) by mouth daily. 90 tablet 1  . metoprolol succinate (TOPROL-XL) 50 MG 24 hr tablet Take 1 tablet (50 mg total) by mouth daily. Take with or immediately following a meal. 90 tablet 1  . Multiple Vitamins-Minerals (MULTIVITAMIN ADULT PO) Take 1 tablet by mouth daily.    . Pitavastatin Calcium (LIVALO) 2 MG TABS Take 2 mg by mouth daily 90 tablet 1  . polyethylene glycol (MIRALAX / GLYCOLAX) packet Take 17 g by mouth every Monday, Wednesday, and Friday.     Marland Kitchen tiZANidine (ZANAFLEX) 4 MG tablet Take 1 tablet (4 mg total) by mouth 2 (two) times daily as needed for muscle spasms. 30 tablet 1  . hydrocortisone (HEMMOREX-HC) 25 MG suppository Place 25 mg rectally 2 (two) times daily as needed for hemorrhoids or anal itching.    .  hyoscyamine (LEVSIN SL) 0.125 MG SL tablet Place 1 tablet (0.125 mg total) under the tongue every 4 (four) hours  as needed. (Patient taking differently: Place 0.125 mg under the tongue every 4 (four) hours as needed for cramping. ) 30 tablet 1   No facility-administered medications prior to visit.     No Known Allergies  Review of Systems  Constitutional: Negative for fever and malaise/fatigue.  HENT: Negative for congestion.   Eyes: Negative for blurred vision.  Respiratory: Negative for shortness of breath.   Cardiovascular: Negative for chest pain, palpitations and leg swelling.  Gastrointestinal: Positive for abdominal pain. Negative for blood in stool, nausea and vomiting.  Genitourinary: Negative for dysuria and frequency.  Musculoskeletal: Negative for falls.  Skin: Negative for rash.  Neurological: Negative for dizziness, loss of consciousness and headaches.  Endo/Heme/Allergies: Negative for environmental allergies.  Psychiatric/Behavioral: Negative for depression. The patient is not nervous/anxious.        Objective:    Physical Exam  Constitutional: He is oriented to person, place, and time. He appears well-developed and well-nourished. No distress.  HENT:  Head: Normocephalic and atraumatic.  Nose: Nose normal.  Eyes: Right eye exhibits no discharge. Left eye exhibits no discharge.  Neck: Normal range of motion. Neck supple.  Cardiovascular: Normal rate and regular rhythm.  No murmur heard. Pulmonary/Chest: Effort normal and breath sounds normal.  Abdominal: Soft. Bowel sounds are normal. There is no tenderness.  Musculoskeletal: He exhibits no edema.  Neurological: He is alert and oriented to person, place, and time.  Skin: Skin is warm and dry.  Psychiatric: He has a normal mood and affect.  Nursing note and vitals reviewed.   BP 118/82 (BP Location: Left Arm, Patient Position: Sitting, Cuff Size: Normal)   Pulse 60   Temp 98.3 F (36.8 C) (Oral)   Resp 18    Ht 5\' 10"  (1.778 m)   Wt 209 lb (94.8 kg)   SpO2 98%   BMI 29.99 kg/m  Wt Readings from Last 3 Encounters:  01/29/18 209 lb (94.8 kg)  10/27/17 212 lb (96.2 kg)  10/05/17 212 lb (96.2 kg)   BP Readings from Last 3 Encounters:  01/29/18 118/82  10/27/17 135/86  10/05/17 118/69     Immunization History  Administered Date(s) Administered  . Influenza Whole 04/12/2013  . Influenza, High Dose Seasonal PF 03/11/2016, 04/07/2017  . Influenza,inj,Quad PF,6+ Mos 04/12/2015  . Influenza-Unspecified 04/13/2014  . Pneumococcal Conjugate-13 08/18/2013  . Pneumococcal Polysaccharide-23 02/19/2016  . Tdap 06/30/2010  . Zoster 06/30/2010    Health Maintenance  Topic Date Due  . INFLUENZA VACCINE  01/28/2018  . COLONOSCOPY  08/26/2019  . TETANUS/TDAP  06/30/2020  . Hepatitis C Screening  Completed  . PNA vac Low Risk Adult  Completed    Lab Results  Component Value Date   WBC 4.8 01/29/2018   HGB 13.2 01/29/2018   HCT 38.9 (L) 01/29/2018   PLT 184.0 01/29/2018   GLUCOSE 115 (H) 01/29/2018   CHOL 145 01/29/2018   TRIG 69.0 01/29/2018   HDL 40.70 01/29/2018   LDLCALC 90 01/29/2018   ALT 23 01/29/2018   AST 22 01/29/2018   NA 141 01/29/2018   K 4.2 01/29/2018   CL 107 01/29/2018   CREATININE 1.22 01/29/2018   BUN 13 01/29/2018   CO2 26 01/29/2018   TSH 1.61 01/29/2018   PSA 1.08 08/07/2014   HGBA1C 6.0 01/29/2018    Lab Results  Component Value Date   TSH 1.61 01/29/2018   Lab Results  Component Value Date   WBC 4.8 01/29/2018   HGB 13.2 01/29/2018  HCT 38.9 (L) 01/29/2018   MCV 85.6 01/29/2018   PLT 184.0 01/29/2018   Lab Results  Component Value Date   NA 141 01/29/2018   K 4.2 01/29/2018   CO2 26 01/29/2018   GLUCOSE 115 (H) 01/29/2018   BUN 13 01/29/2018   CREATININE 1.22 01/29/2018   BILITOT 0.6 01/29/2018   ALKPHOS 84 01/29/2018   AST 22 01/29/2018   ALT 23 01/29/2018   PROT 6.8 01/29/2018   ALBUMIN 4.3 01/29/2018   CALCIUM 9.2 01/29/2018    ANIONGAP 11 01/23/2017   GFR 75.54 01/29/2018   Lab Results  Component Value Date   CHOL 145 01/29/2018   Lab Results  Component Value Date   HDL 40.70 01/29/2018   Lab Results  Component Value Date   LDLCALC 90 01/29/2018   Lab Results  Component Value Date   TRIG 69.0 01/29/2018   Lab Results  Component Value Date   CHOLHDL 4 01/29/2018   Lab Results  Component Value Date   HGBA1C 6.0 01/29/2018         Assessment & Plan:   Problem List Items Addressed This Visit    Hyperlipidemia    Encouraged heart healthy diet, increase exercise, avoid trans fats, consider a krill oil cap daily      Relevant Orders   Lipid panel (Completed)   Hypertension, essential     Well controlled, no changes to meds. Encouraged heart healthy diet such as the DASH diet and exercise as tolerated.       Hyperglycemia     minimize simple carbs. Increase exercise as tolerated.      Relevant Orders   Hemoglobin A1c (Completed)   Abdominal pain    Improved uses Hyoscyamine infrequently with good results. Refill given today      Relevant Medications   hyoscyamine (LEVSIN SL) 0.125 MG SL tablet   Other Relevant Orders   CBC (Completed)   Comprehensive metabolic panel (Completed)   TSH (Completed)   Right shoulder pain    Following with ortho is worsening. May consider surgery      Anemia - Primary    Increase leafy greens, consider increased lean red meat and using cast iron cookware. Continue to monitor, report any concerns. Not taking iron presently      Relevant Orders   CBC (Completed)   TSH (Completed)   Hemorrhoids    Follows with GI has occasional irritation which respond to suppositories, refills given, he will report worsening symptoms         I have changed Fortune Furgerson's hyoscyamine and hydrocortisone. I am also having him maintain his azelastine, ibuprofen, Ferrous Fumarate-Folic Acid, polyethylene glycol, Multiple Vitamins-Minerals (MULTIVITAMIN ADULT PO),  Pitavastatin Calcium, losartan, amLODipine, tiZANidine, metoprolol succinate, and ALPRAZolam.  Meds ordered this encounter  Medications  . hyoscyamine (LEVSIN SL) 0.125 MG SL tablet    Sig: Place 1 tablet (0.125 mg total) under the tongue every 4 (four) hours as needed for cramping.    Dispense:  30 tablet    Refill:  1  . hydrocortisone (HEMMOREX-HC) 25 MG suppository    Sig: Place 1 suppository (25 mg total) rectally 2 (two) times daily as needed for hemorrhoids or anal itching.    Dispense:  12 suppository    Refill:  3    CMA served as Education administrator during this visit. History, Physical and Plan performed by medical provider. Documentation and orders reviewed and attested to.  Penni Homans, MD

## 2018-01-29 NOTE — Assessment & Plan Note (Signed)
Following with ortho is worsening. May consider surgery

## 2018-01-31 NOTE — Assessment & Plan Note (Signed)
minimize simple carbs. Increase exercise as tolerated.  

## 2018-01-31 NOTE — Assessment & Plan Note (Signed)
Well controlled, no changes to meds. Encouraged heart healthy diet such as the DASH diet and exercise as tolerated.  °

## 2018-03-03 ENCOUNTER — Other Ambulatory Visit: Payer: Self-pay | Admitting: Family Medicine

## 2018-03-03 DIAGNOSIS — Z23 Encounter for immunization: Secondary | ICD-10-CM

## 2018-03-15 DIAGNOSIS — M5136 Other intervertebral disc degeneration, lumbar region: Secondary | ICD-10-CM | POA: Diagnosis not present

## 2018-03-25 DIAGNOSIS — M5136 Other intervertebral disc degeneration, lumbar region: Secondary | ICD-10-CM | POA: Diagnosis not present

## 2018-04-07 ENCOUNTER — Other Ambulatory Visit: Payer: Self-pay | Admitting: Family Medicine

## 2018-04-10 ENCOUNTER — Other Ambulatory Visit: Payer: Self-pay | Admitting: Family Medicine

## 2018-04-19 NOTE — Progress Notes (Addendum)
Subjective:   [name] is a 72 y.o. male who presents for Medicare Annual/Subsequent preventive examination.  Pt states his 52th anniversary is next week. Him and wife are traveling to [place].   Pt states he has had an umbilical hernia since 6295 and was told to tell the doctor if he started having any issues or changes with it. Pt states that in the past few weeks, he feels the hernia protrudes more when he stands and is somewhat uncomfortable but not very painful. Dr.Blyth notified-will put in referral.  Review of Systems: No ROS.  Medicare Wellness Visit. Additional risk factors are reflected in the social history.   Sleep patterns: takes Xanax as needed for sleep. Home Safety/Smoke Alarms: Feels safe in home. Smoke alarms in place. Lives with wife in 2 story home.   Male:   CCS- next due 08/2019     PSA-  Lab Results  Component Value Date   PSA 1.08 08/07/2014   PSA 0.80 11/23/2012       Objective:    Vitals: BP 134/86 (BP Location: Left Arm, Patient Position: Sitting, Cuff Size: Normal)   Pulse 67   Ht 5\' 10"  (1.778 m)   Wt 217 lb 3.2 oz (98.5 kg)   SpO2 97%   BMI 31.16 kg/m   Body mass index is 31.16 kg/m.  Advanced Directives 04/20/2018 10/05/2017 09/22/2017 01/23/2017 08/30/2016 08/22/2016 12/22/2015  Does Patient Have a Medical Advance Directive? Yes Yes Yes No Yes Yes No  Type of Paramedic of [place];Living will [place];Living will Living will;Healthcare Power of [place];Living will [place];Living will -  Does patient want to make changes to medical advance directive? - - No - Patient declined - - - -  Copy of [place] in Chart? No - copy requested No - copy requested No - copy requested - No - copy requested No - copy requested -  Would patient like information on creating a medical advance directive? - - - - - - No - patient declined  information    Tobacco Social History   Tobacco Use  Smoking Status Never Smoker  Smokeless Tobacco Never Used     Counseling given: Not Answered   Clinical Intake: Pain : No/denies pain    Past Medical History:  Diagnosis Date  . Abnormal CT scan, gastrointestinal tract suspect angioedema   . Anemia 03/16/2016  . Anxiety   . Arthritis   . Back pain 10/10/2012  . Fatty liver 2005   seen on 2005 ultrasound,05/2015 CT.   Marland Kitchen Hemorrhoids 08/22/2016  . Hemorrhoids, internal, with bleeding 2005   internal and external. 2005 band ligation (dr Ferdinand Lango in [facility])  . Hyperglycemia 08/21/2013  . Hyperlipidemia   . Hypertension   . Nocturia 08/21/2013  . Obesity (BMI 30.0-34.9)   . Osteoporosis   . Pneumonia    as a child  . Rectal bleeding 07/30/2014  . Renal cyst   . SBO (small bowel obstruction) ([place])   . 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 Unspecified constipation 02/06/2014  . Vitamin D deficiency    Past Surgical History:  Procedure Laterality Date  . COLONOSCOPY  before 2005, 2012, 2013, 08/2014.   Marland Kitchen ENTEROSCOPY N/A 10/05/2017   Procedure: ENTEROSCOPY;  Surgeon: Gatha Mayer, MD;  [facility];  Service: Endoscopy;  Laterality: N/A;  . FRACTURE SURGERY Left 1989   leg  .  HEMORRHOID BANDING  2005  . KNEE SURGERY Left 2010   arthroscopy, torn carilage  . KNEE SURGERY Right 2012   arthroscopy  . TONSILLECTOMY  1957   Family History  Problem Relation Age of Onset  . Stroke Mother   . Aneurysm Mother        brain  . Arthritis Father   . Liver cancer Father        liver, atomic testing in [employer]  . Gallbladder disease Father   . Hyperlipidemia Sister   . Hypertension Sister   . Obesity Sister   . Diabetes Sister   . Diabetes Brother   . Hyperlipidemia Sister   . Hypertension Sister   . Obesity Sister   . Diabetes Sister   . Aneurysm Brother        brain  . COPD Brother   . Gallbladder disease Sister        x4  . Colon  cancer Neg Hx   . Colon polyps Neg Hx   . Esophageal cancer Neg Hx   . Rectal cancer Neg Hx   . Stomach cancer Neg Hx    Social History   Socioeconomic History  . Marital status: Married    Spouse name: Not on file  . Number of children: 1  . Years of education: Not on file  . Highest education level: Not on file  Occupational History  . Occupation: Retired    Fish farm manager: [employer]  . Financial resource strain: Not on file  . Food insecurity:    Worry: Not on file    Inability: Not on file  . Transportation needs:    Medical: Not on file    Non-medical: Not on file  Tobacco Use  . Smoking status: Never Smoker  . Smokeless tobacco: Never Used  Substance and Sexual Activity  . Alcohol use: Yes    Alcohol/week: 2.0 standard drinks    Types: 2 Standard drinks or equivalent per week    Comment: Occassionally  . Drug use: No  . Sexual activity: Yes  Lifestyle  . Physical activity:    Days per week: Not on file    Minutes per session: Not on file  . Stress: Not on file  Relationships  . Social connections:    Talks on phone: Not on file    Gets together: Not on file    Attends religious service: Not on file    Active member of club or organization: Not on file    Attends meetings of clubs or organizations: Not on file    Relationship status: Not on file  Other Topics Concern  . Not on file  Social History Narrative   Married - 1 child   Retired from [employer], no tbacco/drug use    Outpatient Encounter Medications as of 04/20/2018  Medication Sig  . ALPRAZolam (XANAX) 0.5 MG tablet Take 1 tablet (0.5 mg total) by mouth 2 (two) times daily as needed for anxiety.  Marland Kitchen amLODipine (NORVASC) 5 MG tablet TAKE 1 TABLET DAILY  . azelastine (OPTIVAR) 0.05 % ophthalmic solution Place 1 drop into the left eye 2 (two) times daily. (Patient taking differently: Place 1 drop into both eyes 2 (two) times daily as needed (for itching). )  . Ferrous  Fumarate-Folic Acid 938-1 MG TABS Take 1 mg by mouth daily.  . hydrocortisone (HEMMOREX-HC) 25 MG suppository Place 1 suppository (25 mg total) rectally 2 (two) times daily as needed for  hemorrhoids or anal itching.  Marland Kitchen ibuprofen (ADVIL,MOTRIN) 400 MG tablet Take 1 tablet (400 mg total) by mouth every 8 (eight) hours as needed. (Patient taking differently: Take 400 mg by mouth every 8 (eight) hours as needed for headache or moderate pain. )  . losartan (COZAAR) 50 MG tablet TAKE 1 TABLET DAILY  . Multiple Vitamins-Minerals (MULTIVITAMIN ADULT PO) Take 1 tablet by mouth daily.  . Pitavastatin Calcium (LIVALO) 2 MG TABS TAKE 1 TABLET EVERY DAY  . polyethylene glycol (MIRALAX / GLYCOLAX) packet Take 17 g by mouth every Monday, Wednesday, and Friday.   Marland Kitchen tiZANidine (ZANAFLEX) 4 MG tablet Take 1 tablet (4 mg total) by mouth 2 (two) times daily as needed for muscle spasms.  . TOPROL XL 50 MG 24 hr tablet TAKE 1 TABLET DAILY . TAKE WITH OR IMMEDIATELY FOLLOWING A MEAL  . hyoscyamine (LEVSIN SL) 0.125 MG SL tablet Place 1 tablet (0.125 mg total) under the tongue every 4 (four) hours as needed for cramping.   No facility-administered encounter medications on file as of 04/20/2018.     Activities of Daily Living In your present state of health, do you have any difficulty performing the following activities: 04/20/2018  Hearing? N  Vision? N  Difficulty concentrating or making decisions? N  Walking or climbing stairs? N  Dressing or bathing? N  Doing errands, shopping? N  Preparing Food and eating ? N  Using the Toilet? N  In the past six months, have you accidently leaked urine? N  Comment pt reports last urology appt 11/2017  Do you have problems with loss of bowel control? N  Managing your Medications? N  Managing your Finances? N  Housekeeping or managing your Housekeeping? N  Some recent data might be hidden    Patient Care Team: Mosie Lukes, MD as PCP - General (Family Medicine)     Assessment:   This is a routine wellness examination for [name]. Physical assessment deferred to PCP.  Exercise Activities and Dietary recommendations Current Exercise Habits: Home exercise routine, Type of exercise: treadmill;strength training/weights, Time (Minutes): 60, Frequency (Times/Week): 3, Weekly Exercise (Minutes/Week): 180   Diet (meal preparation, eat out, water intake, caffeinated beverages, dairy products, fruits and vegetables): in general, a "healthy" diet  , well balanced    Goals    . Get back in the gym 3x/week       Fall Risk Fall Risk  04/20/2018 01/29/2018 08/22/2016 07/05/2015  Falls in the past year? No No No No    Depression Screen PHQ 2/9 Scores 04/20/2018 01/29/2018 08/22/2016 07/05/2015  PHQ - 2 Score 0 0 0 0    Cognitive Function Ad8 score reviewed for issues:  Issues making decisions: no  Less interest in hobbies / activities:no  Repeats questions, stories (family complaining):no  Trouble using ordinary gadgets (microwave, computer, phone):no  Forgets the month or year: no  Mismanaging finances: no  Remembering appts:no  Daily problems with thinking and/or memory:no Ad8 score is=0  MMSE - Mini Mental State Exam 08/22/2016  Orientation to time 5  Orientation to Place 5  Registration 3  Attention/ Calculation 5  Recall 3  Language- name 2 objects 2  Language- repeat 1  Language- follow 3 step command 3  Language- read & follow direction 1  Write a sentence 1  Copy design 1  Total score 30        Immunization History  Administered Date(s) Administered  . Influenza Whole 04/12/2013  . Influenza, High Dose Seasonal  PF 03/11/2016, 04/07/2017  . Influenza,inj,Quad PF,6+ Mos 04/12/2015  . Influenza-Unspecified 04/13/2014  . Pneumococcal Conjugate-13 08/18/2013  . Pneumococcal Polysaccharide-23 02/19/2016  . Tdap 06/30/2010  . Zoster 06/30/2010    Screening Tests Health Maintenance  Topic Date Due  . INFLUENZA VACCINE   01/28/2018  . COLONOSCOPY  08/26/2019  . TETANUS/TDAP  06/30/2020  . Hepatitis C Screening  Completed  . PNA vac Low Risk Adult  Completed       Plan:    Please schedule your next medicare wellness visit with me in 1 yr.  You received your flu shot today.  Continue to eat heart healthy diet (full of fruits, vegetables, whole grains, lean protein, water--limit salt, fat, and sugar intake) and increase physical activity as tolerated.  Pt states he has had an umbilical hernia since 3762 and was told to tell the doctor if he started having any issues or changes with it. Pt states that in the past few weeks, he feels the hernia protrudes more when he stands and is somewhat uncomfortable but not very painful. Dr.Blyth notified-will put in referral.  I have personally reviewed and noted the following in the patient's chart:   . Medical and social history . Use of alcohol, tobacco or illicit drugs  . Current medications and supplements . Functional ability and status . Nutritional status . Physical activity . Advanced directives . List of other physicians . Hospitalizations, surgeries, and ER visits in previous 12 months . Vitals . Screenings to include cognitive, depression, and falls . Referrals and appointments  In addition, I have reviewed and discussed with patient certain preventive protocols, quality metrics, and best practice recommendations. A written personalized care plan for preventive services as well as general preventive health recommendations were provided to patient.     Shela Nevin, [place]  04/20/2018  Medical screening examination/treatment was performed by qualified clinical staff member and as supervising physician I was immediately available for consultation/collaboration. I have reviewed documentation and agree with assessment and plan.  Penni Homans, MD

## 2018-04-19 NOTE — Progress Notes (Deleted)
Subjective:   [name] is a 71 y.o. male who presents for Medicare Annual/Subsequent preventive examination.  Review of Systems:  ***       Objective:    Vitals: There were no vitals taken for this visit.  There is no height or weight on file to calculate BMI.  Advanced Directives 10/05/2017 09/22/2017 01/23/2017 08/30/2016 08/22/2016 12/22/2015 06/04/2015  Does Patient Have a Medical Advance Directive? Yes Yes No Yes Yes No Yes  Type of Paramedic of [place];Living will Living will;Healthcare Power of [place];Living will [place];Living will - [place];Living will  Does patient want to make changes to medical advance directive? - No - Patient declined - - - - -  Copy of [place] in Chart? No - copy requested No - copy requested - No - copy requested No - copy requested - -  Would patient like information on creating a medical advance directive? - - - - - No - patient declined information -    Tobacco Social History   Tobacco Use  Smoking Status Never Smoker  Smokeless Tobacco Never Used     Counseling given: Not Answered   Clinical Intake:                       Past Medical History:  Diagnosis Date  . Abnormal CT scan, gastrointestinal tract suspect angioedema   . Anemia 03/16/2016  . Anxiety   . Arthritis   . Back pain 10/10/2012  . Fatty liver 2005   seen on 2005 ultrasound,05/2015 CT.   Marland Kitchen Hemorrhoids 08/22/2016  . Hemorrhoids, internal, with bleeding 2005   internal and external. 2005 band ligation (dr Ferdinand Lango in [facility])  . Hyperglycemia 08/21/2013  . Hyperlipidemia   . Hypertension   . Nocturia 08/21/2013  . Obesity (BMI 30.0-34.9)   . Osteoporosis   . Pneumonia    as a child  . Rectal bleeding 07/30/2014  . Renal cyst   . SBO (small bowel obstruction) ([place])   . 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 Unspecified  constipation 02/06/2014  . Vitamin D deficiency    Past Surgical History:  Procedure Laterality Date  . COLONOSCOPY  before 2005, 2012, 2013, 08/2014.   Marland Kitchen ENTEROSCOPY N/A 10/05/2017   Procedure: ENTEROSCOPY;  Surgeon: Gatha Mayer, MD;  Location: [facility] END[facility];  Service: Endoscopy;  Laterality: N/A;  . FRACTURE SURGERY Left 1989   leg  . HEMORRHOID BANDING  2005  . KNEE SURGERY Left 2010   arthroscopy, torn carilage  . KNEE SURGERY Right 2012   arthroscopy  . TONSILLECTOMY  1957   Family History  Problem Relation Age of Onset  . Stroke Mother   . Aneurysm Mother        brain  . Arthritis Father   . Liver cancer Father        liver, atomic testing in [employer]  . Gallbladder disease Father   . Hyperlipidemia Sister   . Hypertension Sister   . Obesity Sister   . Diabetes Sister   . Diabetes Brother   . Hyperlipidemia Sister   . Hypertension Sister   . Obesity Sister   . Diabetes Sister   . Aneurysm Brother        brain  . COPD Brother   . Gallbladder disease Sister        x4  . Colon cancer  Neg Hx   . Colon polyps Neg Hx   . Esophageal cancer Neg Hx   . Rectal cancer Neg Hx   . Stomach cancer Neg Hx    Social History   Socioeconomic History  . Marital status: Married    Spouse name: Not on file  . Number of children: 1  . Years of education: Not on file  . Highest education level: Not on file  Occupational History  . Occupation: Retired    Fish farm manager: [employer]  . Financial resource strain: Not on file  . Food insecurity:    Worry: Not on file    Inability: Not on file  . Transportation needs:    Medical: Not on file    Non-medical: Not on file  Tobacco Use  . Smoking status: Never Smoker  . Smokeless tobacco: Never Used  Substance and Sexual Activity  . Alcohol use: Yes    Alcohol/week: 2.0 standard drinks    Types: 2 Standard drinks or equivalent per week    Comment: Occassionally  . Drug use: No  . Sexual activity: Yes    Lifestyle  . Physical activity:    Days per week: Not on file    Minutes per session: Not on file  . Stress: Not on file  Relationships  . Social connections:    Talks on phone: Not on file    Gets together: Not on file    Attends religious service: Not on file    Active member of club or organization: Not on file    Attends meetings of clubs or organizations: Not on file    Relationship status: Not on file  Other Topics Concern  . Not on file  Social History Narrative   Married - 1 child   Retired from [employer], no tbacco/drug use    Outpatient Encounter Medications as of 04/20/2018  Medication Sig  . ALPRAZolam (XANAX) 0.5 MG tablet Take 1 tablet (0.5 mg total) by mouth 2 (two) times daily as needed for anxiety.  Marland Kitchen amLODipine (NORVASC) 5 MG tablet TAKE 1 TABLET DAILY  . azelastine (OPTIVAR) 0.05 % ophthalmic solution Place 1 drop into the left eye 2 (two) times daily. (Patient taking differently: Place 1 drop into both eyes 2 (two) times daily as needed (for itching). )  . Ferrous Fumarate-Folic Acid 144-3 MG TABS Take 1 mg by mouth daily.  . hydrocortisone (HEMMOREX-HC) 25 MG suppository Place 1 suppository (25 mg total) rectally 2 (two) times daily as needed for hemorrhoids or anal itching.  . hyoscyamine (LEVSIN SL) 0.125 MG SL tablet Place 1 tablet (0.125 mg total) under the tongue every 4 (four) hours as needed for cramping.  Marland Kitchen ibuprofen (ADVIL,MOTRIN) 400 MG tablet Take 1 tablet (400 mg total) by mouth every 8 (eight) hours as needed. (Patient taking differently: Take 400 mg by mouth every 8 (eight) hours as needed for headache or moderate pain. )  . losartan (COZAAR) 50 MG tablet TAKE 1 TABLET DAILY  . Multiple Vitamins-Minerals (MULTIVITAMIN ADULT PO) Take 1 tablet by mouth daily.  . Pitavastatin Calcium (LIVALO) 2 MG TABS TAKE 1 TABLET EVERY DAY  . polyethylene glycol (MIRALAX / GLYCOLAX) packet Take 17 g by mouth every Monday, Wednesday, and Friday.   Marland Kitchen  tiZANidine (ZANAFLEX) 4 MG tablet Take 1 tablet (4 mg total) by mouth 2 (two) times daily as needed for muscle spasms.  . TOPROL XL 50 MG 24 hr tablet TAKE 1  TABLET DAILY . TAKE WITH OR IMMEDIATELY FOLLOWING A MEAL   No facility-administered encounter medications on file as of 04/20/2018.     Activities of Daily Living No flowsheet data found.  Patient Care Team: Mosie Lukes, MD as PCP - General (Family Medicine)   Assessment:   This is a routine wellness examination for [name].  Exercise Activities and Dietary recommendations    Goals    . Get back in the gym 3x/week       Fall Risk Fall Risk  01/29/2018 08/22/2016 07/05/2015  Falls in the past year? No No No   Is the patient's home free of loose throw rugs in walkways, pet beds, electrical cords, etc?   {Blank single:19197::"yes","no"}      Grab bars in the bathroom? {Blank single:19197::"yes","no"}      Handrails on the stairs?   {Blank single:19197::"yes","no"}      Adequate lighting?   {Blank single:19197::"yes","no"}  Timed Get Up and Go Performed: ***  Depression Screen PHQ 2/9 Scores 01/29/2018 08/22/2016 07/05/2015  PHQ - 2 Score 0 0 0    Cognitive Function MMSE - Mini Mental State Exam 08/22/2016  Orientation to time 5  Orientation to Place 5  Registration 3  Attention/ Calculation 5  Recall 3  Language- name 2 objects 2  Language- repeat 1  Language- follow 3 step command 3  Language- read & follow direction 1  Write a sentence 1  Copy design 1  Total score 30        Immunization History  Administered Date(s) Administered  . Influenza Whole 04/12/2013  . Influenza, High Dose Seasonal PF 03/11/2016, 04/07/2017  . Influenza,inj,Quad PF,6+ Mos 04/12/2015  . Influenza-Unspecified 04/13/2014  . Pneumococcal Conjugate-13 08/18/2013  . Pneumococcal Polysaccharide-23 02/19/2016  . Tdap 06/30/2010  . Zoster 06/30/2010    Qualifies for Shingles Vaccine? ***  Screening Tests Health Maintenance  Topic  Date Due  . INFLUENZA VACCINE  01/28/2018  . COLONOSCOPY  08/26/2019  . TETANUS/TDAP  06/30/2020  . Hepatitis C Screening  Completed  . PNA vac Low Risk Adult  Completed   Cancer Screenings: Lung: Low Dose CT Chest recommended if Age 75-80 years, 30 pack-year currently smoking OR have quit w/in 15years. Patient {DOES NOT does:27190::"does not"} qualify. Colorectal: ***  Additional Screenings: *** Hepatitis C Screening:      Plan:   ***  I have personally reviewed and noted the following in the patient's chart:   . Medical and social history . Use of alcohol, tobacco or illicit drugs  . Current medications and supplements . Functional ability and status . Nutritional status . Physical activity . Advanced directives . List of other physicians . Hospitalizations, surgeries, and ER visits in previous 12 months . Vitals . Screenings to include cognitive, depression, and falls . Referrals and appointments  In addition, I have reviewed and discussed with patient certain preventive protocols, quality metrics, and best practice recommendations. A written personalized care plan for preventive services as well as general preventive health recommendations were provided to patient.     Shela Nevin, [place]  04/19/2018

## 2018-04-20 ENCOUNTER — Ambulatory Visit: Payer: Medicare Other

## 2018-04-20 ENCOUNTER — Ambulatory Visit (INDEPENDENT_AMBULATORY_CARE_PROVIDER_SITE_OTHER): Payer: Medicare Other | Admitting: *Deleted

## 2018-04-20 ENCOUNTER — Encounter: Payer: Self-pay | Admitting: *Deleted

## 2018-04-20 ENCOUNTER — Other Ambulatory Visit: Payer: Self-pay | Admitting: Family Medicine

## 2018-04-20 VITALS — BP 134/86 | HR 67 | Ht 70.0 in | Wt 217.2 lb

## 2018-04-20 DIAGNOSIS — Z0000 Encounter for general adult medical examination without abnormal findings: Secondary | ICD-10-CM

## 2018-04-20 DIAGNOSIS — K429 Umbilical hernia without obstruction or gangrene: Secondary | ICD-10-CM

## 2018-04-20 DIAGNOSIS — Z23 Encounter for immunization: Secondary | ICD-10-CM

## 2018-04-20 MED FILL — SHINGRIX 50 MCG SUS: 50 | 1 days supply | Qty: 1 | Fill #0

## 2018-04-20 NOTE — Patient Instructions (Addendum)
Please schedule your next medicare wellness visit with me in 1 yr.  You received your flu shot today.  Continue to eat heart healthy diet (full of fruits, vegetables, whole grains, lean protein, water--limit salt, fat, and sugar intake) and increase physical activity as tolerated.  Dr.Blyth will put in a referral concerning your hernia.  [name] , Thank you for taking time to come for your Medicare Wellness Visit. I appreciate your ongoing commitment to your health goals. Please review the following plan we discussed and let me know if I can assist you in the future.   These are the goals we discussed: Goals    . Get back in the gym 3x/week       This is a list of the screening recommended for you and due dates:  Health Maintenance  Topic Date Due  . Flu Shot  01/28/2018  . Colon Cancer Screening  08/26/2019  . Tetanus Vaccine  06/30/2020  .  Hepatitis C: One time screening is recommended by Center for Disease Control  (CDC) for  adults born from [birth year] through 1963.   Completed  . Pneumonia vaccines  Completed    Health Maintenance, Male A healthy lifestyle and preventive care is important for your health and wellness. Ask your health care provider about what schedule of regular examinations is right for you. What should I know about weight and diet? Eat a Healthy Diet  Eat plenty of vegetables, fruits, whole grains, low-fat dairy products, and lean protein.  Do not eat a lot of foods high in solid fats, added sugars, or salt.  Maintain a Healthy Weight Regular exercise can help you achieve or maintain a healthy weight. You should:  Do at least 150 minutes of exercise each week. The exercise should increase your heart rate and make you sweat (moderate-intensity exercise).  Do strength-training exercises at least twice a week.  Watch Your Levels of Cholesterol and Blood Lipids  Have your blood tested for lipids and cholesterol every 5 years starting at 70 years of age.  If you are at high risk for heart disease, you should start having your blood tested when you are 70 years old. You may need to have your cholesterol levels checked more often if: ? Your lipid or cholesterol levels are high. ? You are older than 70 years of age. ? You are at high risk for heart disease.  What should I know about cancer screening? Many types of cancers can be detected early and may often be prevented. Lung Cancer  You should be screened every year for lung cancer if: ? You are a current smoker who has smoked for at least 30 years. ? You are a former smoker who has quit within the past 15 years.  Talk to your health care provider about your screening options, when you should start screening, and how often you should be screened.  Colorectal Cancer  Routine colorectal cancer screening usually begins at 70 years of age and should be repeated every 5-10 years until you are 70 years old. You may need to be screened more often if early forms of precancerous polyps or small growths are found. Your health care provider may recommend screening at an earlier age if you have risk factors for colon cancer.  Your health care provider may recommend using home test kits to check for hidden blood in the stool.  A small camera at the end of a tube can be used to examine your colon (sigmoidoscopy  or colonoscopy). This checks for the earliest forms of colorectal cancer.  Prostate and Testicular Cancer  Depending on your age and overall health, your health care provider may do certain tests to screen for prostate and testicular cancer.  Talk to your health care provider about any symptoms or concerns you have about testicular or prostate cancer.  Skin Cancer  Check your skin from head to toe regularly.  Tell your health care provider about any new moles or changes in moles, especially if: ? There is a change in a mole's size, shape, or color. ? You have a mole that is larger than a  pencil eraser.  Always use sunscreen. Apply sunscreen liberally and repeat throughout the day.  Protect yourself by wearing long sleeves, pants, a wide-brimmed hat, and sunglasses when outside.  What should I know about heart disease, diabetes, and high blood pressure?  If you are 59-59 years of age, have your blood pressure checked every 3-5 years. If you are 74 years of age or older, have your blood pressure checked every year. You should have your blood pressure measured twice-once when you are at a hospital or clinic, and once when you are not at a hospital or clinic. Record the average of the two measurements. To check your blood pressure when you are not at a hospital or clinic, you can use: ? An automated blood pressure machine at a pharmacy. ? A home blood pressure monitor.  Talk to your health care provider about your target blood pressure.  If you are between 2-88 years old, ask your health care provider if you should take aspirin to prevent heart disease.  Have regular diabetes screenings by checking your fasting blood sugar level. ? If you are at a normal weight and have a low risk for diabetes, have this test once every three years after the age of 38. ? If you are overweight and have a high risk for diabetes, consider being tested at a younger age or more often.  A one-time screening for abdominal aortic aneurysm (AAA) by ultrasound is recommended for men aged 78-75 years who are current or former smokers. What should I know about preventing infection? Hepatitis B If you have a higher risk for hepatitis B, you should be screened for this virus. Talk with your health care provider to find out if you are at risk for hepatitis B infection. Hepatitis C Blood testing is recommended for:  Everyone born from [birth year] through 1963.  Anyone with known risk factors for hepatitis C.  Sexually Transmitted Diseases (STDs)  You should be screened each year for STDs including gonorrhea  and chlamydia if: ? You are sexually active and are younger than 70 years of age. ? You are older than 70 years of age and your health care provider tells you that you are at risk for this type of infection. ? Your sexual activity has changed since you were last screened and you are at an increased risk for chlamydia or gonorrhea. Ask your health care provider if you are at risk.  Talk with your health care provider about whether you are at high risk of being infected with HIV. Your health care provider may recommend a prescription medicine to help prevent HIV infection.  What else can I do?  Schedule regular health, dental, and eye exams.  Stay current with your vaccines (immunizations).  Do not use any tobacco products, such as cigarettes, chewing tobacco, and e-cigarettes. If you need help quitting, ask your  health care provider.  Limit alcohol intake to no more than 2 drinks per day. One drink equals 12 ounces of beer, 5 ounces of wine, or 1 ounces of hard liquor.  Do not use street drugs.  Do not share needles.  Ask your health care provider for help if you need support or information about quitting drugs.  Tell your health care provider if you often feel depressed.  Tell your health care provider if you have ever been abused or do not feel safe at home. This information is not intended to replace advice given to you by your health care provider. Make sure you discuss any questions you have with your health care provider. Document Released: 12/13/2007 Document Revised: 02/13/2016 Document Reviewed: 03/20/2015 Elsevier Interactive Patient Education  [employer].

## 2018-05-11 ENCOUNTER — Ambulatory Visit: Payer: Self-pay | Admitting: Surgery

## 2018-05-11 DIAGNOSIS — K429 Umbilical hernia without obstruction or gangrene: Secondary | ICD-10-CM | POA: Diagnosis not present

## 2018-05-11 NOTE — H&P (View-Only) (Signed)
[name] Documented: 05/11/2018 11:36 AM Location: [facility] Patient #: [id number] DOB: 19-Aug-1945 Married / Language: English / Race: Black or African American Male  History of Present Illness Adin Hector MD; 05/11/2018 12:11 PM) The patient is a 72 year old male who presents with an umbilical hernia. Note for "Umbilical hernia": ` ` ` Patient sent for surgical consultation at the request of Dr. Willette Alma  Chief Complaint: Worsening hernia at bellybutton. ` ` The patient is a pleasant active male. Had episode of nausea vomiting and crampy abdominal pain in 2016. Admitted for enteritis causing a partial small bowel obstruction. Umbilical hernia incidentally noted at that time. His bowel issues have improved but he is noticed that his hernia at the bellybutton has gotten larger. Increased sensitivity in soreness, especially when he is working out. Based on worsening concerns, surgical consultation requested. He comes today with his wife. He's never had any abdominal surgery. He walks on a treadmill at least a half hour a day. Wants do more intense work at the gym but worse at [employer] makes it more sensitive. He does get up at night several times with some nocturia. He been followed by Dr. Risa Grill with [facility]. Not seen anyone recently. No major prostatitis issues. No difficulty starting or maintaining stream. No cardiac issues. Does not smoke. He is not diabetic. Groin discomfort.  No personal nor family history of GI/colon cancer, inflammatory bowel disease, irritable bowel syndrome, allergy such as Celiac Sprue, dietary/dairy problems, colitis, ulcers nor gastritis. No recent sick contacts/gastroenteritis. No travel outside the country. No changes in diet. No dysphagia to solids or liquids. No significant heartburn or reflux. No hematochezia, hematemesis, coffee ground emesis. No evidence of prior gastric/peptic ulceration. He does  struggle with some intermittent constipation. Past take occasional MiraLAX improved just move his bowels every other day. Had an underwhelming colonoscopy by Dr. Silvano Rusk many years ago.  (Review of systems as stated in this history (HPI) or in the review of systems. Otherwise all other 12 point ROS are negative) ` ` `   Past Surgical History Geni Bers [place], RMA; 05/11/2018 11:37 AM) Colon Polyp Removal - Colonoscopy Colon Polyp Removal - Open Knee Surgery Bilateral. Tonsillectomy  Allergies Geni Bers [place], RMA; 05/11/2018 11:37 AM) No Known Drug Allergies [05/11/2018]: Allergies Reconciled  Medication History [employer], RMA; 05/11/2018 11:38 AM) amLODIPine Besylate (5MG  Tablet, Oral) Active. Losartan Potassium (50MG  Tablet, Oral) Active. Toprol XL (50MG  Tablet ER 24HR, Oral) Active. ALPRAZolam (0.5MG  Tablet, Oral) Active. Livalo (2MG  Tablet, Oral) Active. Multi Vitamin (Oral) Active. Medications Reconciled  Social History Geni Bers [place], RMA; 05/11/2018 11:37 AM) Alcohol use Occasional alcohol use. Caffeine use Carbonated beverages, Coffee, Tea. Illicit drug use Remotely quit drug use. Tobacco use Never smoker.  Family History Geni Bers [place], RMA; 05/11/2018 11:37 AM) Arthritis Father. Cerebrovascular Accident Mother. Hypertension Mother.  Other Problems Geni Bers [place], RMA; 05/11/2018 11:37 AM) Arthritis Back Pain Hemorrhoids High blood pressure Hypercholesterolemia Umbilical Hernia Repair    Vitals (Jacqueline Haggett RMA; 05/11/2018 11:39 AM) 05/11/2018 11:38 AM Weight: 215.8 lb Height: 70in Body Surface Area: 2.16 m Body Mass Index: 30.96 kg/m  Temp.: 97.51F(Temporal)  Pulse: 85 (Regular)  P.OX: 98% (Room air) BP: 138/80 (Sitting, Left Arm, Standard)      Physical Exam Adin Hector MD; 05/11/2018 12:04 PM)  General Mental Status-Alert. General Appearance-Not  in acute distress, Not Sickly. Orientation-Oriented X3. Hydration-Well hydrated. Voice-Normal.  Integumentary Global Assessment Upon inspection and palpation of skin surfaces of the - Axillae: non-tender,  no inflammation or ulceration, no drainage. and Distribution of scalp and body hair is normal. General Characteristics Temperature - normal warmth is noted.  Head and Neck Head-normocephalic, atraumatic with no lesions or palpable masses. Face Global Assessment - atraumatic, no absence of expression. Neck Global Assessment - no abnormal movements, no bruit auscultated on the right, no bruit auscultated on the left, no decreased range of motion, non-tender. Trachea-midline. Thyroid Gland Characteristics - non-tender.  Eye Eyeball - Left-Extraocular movements intact, No Nystagmus. Eyeball - Right-Extraocular movements intact, No Nystagmus. Cornea - Left-No Hazy. Cornea - Right-No Hazy. Sclera/Conjunctiva - Left-No scleral icterus, No Discharge. Sclera/Conjunctiva - Right-No scleral icterus, No Discharge. Pupil - Left-Direct reaction to light normal. Pupil - Right-Direct reaction to light normal.  ENMT Ears Pinna - Left - no drainage observed, no generalized tenderness observed. Right - no drainage observed, no generalized tenderness observed. Nose and Sinuses External Inspection of the Nose - no destructive lesion observed. Inspection of the nares - Left - quiet respiration. Right - quiet respiration. Mouth and Throat Lips - Upper Lip - no fissures observed, no pallor noted. Lower Lip - no fissures observed, no pallor noted. Nasopharynx - no discharge present. Oral Cavity/Oropharynx - Tongue - no dryness observed. Oral Mucosa - no cyanosis observed. Hypopharynx - no evidence of airway distress observed.  Chest and Lung Exam Inspection Movements - Normal and Symmetrical. Accessory muscles - No use of accessory muscles in  breathing. Palpation Palpation of the chest reveals - Non-tender. Auscultation Breath sounds - Normal and Clear.  Cardiovascular Auscultation Rhythm - Regular. Murmurs & Other Heart Sounds - Auscultation of the heart reveals - No Murmurs and No Systolic Clicks.  Abdomen Inspection Inspection of the abdomen reveals - No Visible peristalsis and No Abnormal pulsations. Umbilicus - No Bleeding, No Urine drainage. Palpation/Percussion Palpation and Percussion of the abdomen reveal - Soft, Non Tender, No Rebound tenderness, No Rigidity (guarding) and No Cutaneous hyperesthesia. Note: Abdomen soft. 25 mm mass that ultimately reduces down to a smaller but sensitive umbilical hernia fascial defect. Moderate suprapubic umbilical diastases recti. Nontender. Not distended. No umbilical or incisional hernias. No guarding.  Male Genitourinary Sexual Maturity Tanner 5 - Adult hair pattern and Adult penile size and shape. Note: No inguinal hernias. Normal external genitalia. Epididymi, testes, and spermatic cords normal without any masses.  Peripheral Vascular Upper Extremity Inspection - Left - No Cyanotic nailbeds, Not Ischemic. Right - No Cyanotic nailbeds, Not Ischemic.  Neurologic Neurologic evaluation reveals -normal attention span and ability to concentrate, able to name objects and repeat phrases. Appropriate fund of knowledge , normal sensation and normal coordination. Mental Status Affect - not angry, not paranoid. Cranial Nerves-Normal Bilaterally. Gait-Normal.  Neuropsychiatric Mental status exam performed with findings of-able to articulate well with normal speech/language, rate, volume and coherence, thought content normal with ability to perform basic computations and apply abstract reasoning and no evidence of hallucinations, delusions, obsessions or homicidal/suicidal ideation.  Musculoskeletal Global Assessment Spine, Ribs and Pelvis - no instability, subluxation  or laxity. Right Upper Extremity - no instability, subluxation or laxity.  Lymphatic Head & Neck  General Head & Neck Lymphatics: Bilateral - Description - No Localized lymphadenopathy. Axillary  General Axillary Region: Bilateral - Description - No Localized lymphadenopathy. Femoral & Inguinal  Generalized Femoral & Inguinal Lymphatics: Left - Description - No Localized lymphadenopathy. Right - Description - No Localized lymphadenopathy.    Assessment & Plan Adin Hector MD; 95/18/8416 60:63 PM)  UMBILICAL HERNIA WITHOUT OBSTRUCTION AND WITHOUT  GANGRENE (K42.9) Impression: Sensitive periumbilical ventral hernia in the setting of diastases recti in a very active, large man.  He doesn't is gotten larger become more sensitive, it is reasonable to go ahead and be proactive and repair it. I would do an underlay repair with mesh given the borderline size, his activity level, his physical requirements., Diastases recti. Laparoscopic exploration. Should be able to do an outpatient surgery. They are interested in proceeding.   PREOP - [place] - ENCOUNTER FOR PREOPERATIVE EXAMINATION FOR GENERAL SURGICAL PROCEDURE (Z01.818)  Current Plans You are being scheduled for surgery- Our schedulers will call you.  You should hear from our office's scheduling department within 5 working days about the location, date, and time of surgery. We try to make accommodations for patient's preferences in scheduling surgery, but sometimes the OR schedule or the surgeon's schedule prevents [place] from making those accommodations.  If you have not heard from our office [phone]) in 5 working days, call the office and ask for your surgeon's nurse.  If you have other questions about your diagnosis, plan, or surgery, call the office and ask for your surgeon's nurse.  Written instructions provided The anatomy & physiology of the abdominal wall was discussed. The pathophysiology of hernias was discussed.  Natural history risks without surgery including progeressive enlargement, pain, incarceration, & strangulation was discussed. Contributors to complications such as smoking, obesity, diabetes, prior surgery, etc were discussed.  I feel the risks of no intervention will lead to serious problems that outweigh the operative risks; therefore, I recommended surgery to reduce and repair the hernia. I explained laparoscopic techniques with possible need for an open approach. I noted the probable use of mesh to patch and/or buttress the hernia repair  Risks such as bleeding, infection, abscess, need for further treatment, heart attack, death, and other risks were discussed. I noted a good likelihood this will help address the problem. Goals of post-operative recovery were discussed as well. Possibility that this will not correct all symptoms was explained. I stressed the importance of low-impact activity, aggressive pain control, avoiding constipation, & not pushing through pain to minimize risk of post-operative chronic pain or injury. Possibility of reherniation especially with smoking, obesity, diabetes, immunosuppression, and other health conditions was discussed. We will work to minimize complications.  An educational handout further explaining the pathology & treatment options was given as well. Questions were answered. The patient expresses understanding & wishes to proceed with surgery.  Pt Education - [facility] Hernia Post-Op HCI (Jajuan Skoog): discussed with patient and provided information. Pt Education - [facility] Pain Control (Fayth Trefry) Pt Education - Pamphlet Given - Laparoscopic Hernia Repair: discussed with patient and provided information. Pt Education - [facility] Mesh education: discussed with patient and provided information.  Adin Hector, MD, FACS, MASCRS Gastrointestinal and Minimally Invasive Surgery    [address], [place], [place] [address] [phone] Main /  Paging [phone] Fax

## 2018-05-11 NOTE — H&P (Signed)
[name] Documented: 05/11/2018 11:36 AM Location: [facility] Patient #: [id number] DOB: [birth year] Married / Language: English / Race: Black or African American Male  History of Present Illness Adin Hector MD; 05/11/2018 12:11 PM) The patient is a 72 year old male who presents with an umbilical hernia. Note for "Umbilical hernia": ` ` ` Patient sent for surgical consultation at the request of Dr. Willette Alma  Chief Complaint: Worsening hernia at bellybutton. ` ` The patient is a pleasant active male. Had episode of nausea vomiting and crampy abdominal pain in 2016. Admitted for enteritis causing a partial small bowel obstruction. Umbilical hernia incidentally noted at that time. His bowel issues have improved but he is noticed that his hernia at the bellybutton has gotten larger. Increased sensitivity in soreness, especially when he is working out. Based on worsening concerns, surgical consultation requested. He comes today with his wife. He's never had any abdominal surgery. He walks on a treadmill at least a half hour a day. Wants do more intense work at the gym but worse at [employer] makes it more sensitive. He does get up at night several times with some nocturia. He been followed by Dr. Risa Grill with [facility]. Not seen anyone recently. No major prostatitis issues. No difficulty starting or maintaining stream. No cardiac issues. Does not smoke. He is not diabetic. Groin discomfort.  No personal nor family history of GI/colon cancer, inflammatory bowel disease, irritable bowel syndrome, allergy such as Celiac Sprue, dietary/dairy problems, colitis, ulcers nor gastritis. No recent sick contacts/gastroenteritis. No travel outside the country. No changes in diet. No dysphagia to solids or liquids. No significant heartburn or reflux. No hematochezia, hematemesis, coffee ground emesis. No evidence of prior gastric/peptic ulceration. He does  struggle with some intermittent constipation. Past take occasional MiraLAX improved just move his bowels every other day. Had an underwhelming colonoscopy by Dr. Silvano Rusk many years ago.  (Review of systems as stated in this history (HPI) or in the review of systems. Otherwise all other 12 point ROS are negative) ` ` `   Past Surgical History Geni Bers [place], RMA; 05/11/2018 11:37 AM) Colon Polyp Removal - Colonoscopy Colon Polyp Removal - Open Knee Surgery Bilateral. Tonsillectomy  Allergies Geni Bers [place], RMA; 05/11/2018 11:37 AM) No Known Drug Allergies [05/11/2018]: Allergies Reconciled  Medication History [employer], RMA; 05/11/2018 11:38 AM) amLODIPine Besylate (5MG  Tablet, Oral) Active. Losartan Potassium (50MG  Tablet, Oral) Active. Toprol XL (50MG  Tablet ER 24HR, Oral) Active. ALPRAZolam (0.5MG  Tablet, Oral) Active. Livalo (2MG  Tablet, Oral) Active. Multi Vitamin (Oral) Active. Medications Reconciled  Social History Geni Bers [place], RMA; 05/11/2018 11:37 AM) Alcohol use Occasional alcohol use. Caffeine use Carbonated beverages, Coffee, Tea. Illicit drug use Remotely quit drug use. Tobacco use Never smoker.  Family History Geni Bers [place], RMA; 05/11/2018 11:37 AM) Arthritis Father. Cerebrovascular Accident Mother. Hypertension Mother.  Other Problems Geni Bers [place], RMA; 05/11/2018 11:37 AM) Arthritis Back Pain Hemorrhoids High blood pressure Hypercholesterolemia Umbilical Hernia Repair    Vitals (Jacqueline Haggett RMA; 05/11/2018 11:39 AM) 05/11/2018 11:38 AM Weight: 215.8 lb Height: 70in Body Surface Area: 2.16 m Body Mass Index: 30.96 kg/m  Temp.: 97.65F(Temporal)  Pulse: 85 (Regular)  P.OX: 98% (Room air) BP: 138/80 (Sitting, Left Arm, Standard)      Physical Exam Adin Hector MD; 05/11/2018 12:04 PM)  General Mental Status-Alert. General Appearance-Not  in acute distress, Not Sickly. Orientation-Oriented X3. Hydration-Well hydrated. Voice-Normal.  Integumentary Global Assessment Upon inspection and palpation of skin surfaces of the - Axillae: non-tender,  no inflammation or ulceration, no drainage. and Distribution of scalp and body hair is normal. General Characteristics Temperature - normal warmth is noted.  Head and Neck Head-normocephalic, atraumatic with no lesions or palpable masses. Face Global Assessment - atraumatic, no absence of expression. Neck Global Assessment - no abnormal movements, no bruit auscultated on the right, no bruit auscultated on the left, no decreased range of motion, non-tender. Trachea-midline. Thyroid Gland Characteristics - non-tender.  Eye Eyeball - Left-Extraocular movements intact, No Nystagmus. Eyeball - Right-Extraocular movements intact, No Nystagmus. Cornea - Left-No Hazy. Cornea - Right-No Hazy. Sclera/Conjunctiva - Left-No scleral icterus, No Discharge. Sclera/Conjunctiva - Right-No scleral icterus, No Discharge. Pupil - Left-Direct reaction to light normal. Pupil - Right-Direct reaction to light normal.  ENMT Ears Pinna - Left - no drainage observed, no generalized tenderness observed. Right - no drainage observed, no generalized tenderness observed. Nose and Sinuses External Inspection of the Nose - no destructive lesion observed. Inspection of the nares - Left - quiet respiration. Right - quiet respiration. Mouth and Throat Lips - Upper Lip - no fissures observed, no pallor noted. Lower Lip - no fissures observed, no pallor noted. Nasopharynx - no discharge present. Oral Cavity/Oropharynx - Tongue - no dryness observed. Oral Mucosa - no cyanosis observed. Hypopharynx - no evidence of airway distress observed.  Chest and Lung Exam Inspection Movements - Normal and Symmetrical. Accessory muscles - No use of accessory muscles in  breathing. Palpation Palpation of the chest reveals - Non-tender. Auscultation Breath sounds - Normal and Clear.  Cardiovascular Auscultation Rhythm - Regular. Murmurs & Other Heart Sounds - Auscultation of the heart reveals - No Murmurs and No Systolic Clicks.  Abdomen Inspection Inspection of the abdomen reveals - No Visible peristalsis and No Abnormal pulsations. Umbilicus - No Bleeding, No Urine drainage. Palpation/Percussion Palpation and Percussion of the abdomen reveal - Soft, Non Tender, No Rebound tenderness, No Rigidity (guarding) and No Cutaneous hyperesthesia. Note: Abdomen soft. 25 mm mass that ultimately reduces down to a smaller but sensitive umbilical hernia fascial defect. Moderate suprapubic umbilical diastases recti. Nontender. Not distended. No umbilical or incisional hernias. No guarding.  Male Genitourinary Sexual Maturity Tanner 5 - Adult hair pattern and Adult penile size and shape. Note: No inguinal hernias. Normal external genitalia. Epididymi, testes, and spermatic cords normal without any masses.  Peripheral Vascular Upper Extremity Inspection - Left - No Cyanotic nailbeds, Not Ischemic. Right - No Cyanotic nailbeds, Not Ischemic.  Neurologic Neurologic evaluation reveals -normal attention span and ability to concentrate, able to name objects and repeat phrases. Appropriate fund of knowledge , normal sensation and normal coordination. Mental Status Affect - not angry, not paranoid. Cranial Nerves-Normal Bilaterally. Gait-Normal.  Neuropsychiatric Mental status exam performed with findings of-able to articulate well with normal speech/language, rate, volume and coherence, thought content normal with ability to perform basic computations and apply abstract reasoning and no evidence of hallucinations, delusions, obsessions or homicidal/suicidal ideation.  Musculoskeletal Global Assessment Spine, Ribs and Pelvis - no instability, subluxation  or laxity. Right Upper Extremity - no instability, subluxation or laxity.  Lymphatic Head & Neck  General Head & Neck Lymphatics: Bilateral - Description - No Localized lymphadenopathy. Axillary  General Axillary Region: Bilateral - Description - No Localized lymphadenopathy. Femoral & Inguinal  Generalized Femoral & Inguinal Lymphatics: Left - Description - No Localized lymphadenopathy. Right - Description - No Localized lymphadenopathy.    Assessment & Plan Adin Hector MD; 23/55/7322 02:54 PM)  UMBILICAL HERNIA WITHOUT OBSTRUCTION AND WITHOUT  GANGRENE (K42.9) Impression: Sensitive periumbilical ventral hernia in the setting of diastases recti in a very active, large man.  He doesn't is gotten larger become more sensitive, it is reasonable to go ahead and be proactive and repair it. I would do an underlay repair with mesh given the borderline size, his activity level, his physical requirements., Diastases recti. Laparoscopic exploration. Should be able to do an outpatient surgery. They are interested in proceeding.   PREOP - [place] - ENCOUNTER FOR PREOPERATIVE EXAMINATION FOR GENERAL SURGICAL PROCEDURE (Z01.818)  Current Plans You are being scheduled for surgery- Our schedulers will call you.  You should hear from our office's scheduling department within 5 working days about the location, date, and time of surgery. We try to make accommodations for patient's preferences in scheduling surgery, but sometimes the OR schedule or the surgeon's schedule prevents [place] from making those accommodations.  If you have not heard from our office [phone]) in 5 working days, call the office and ask for your surgeon's nurse.  If you have other questions about your diagnosis, plan, or surgery, call the office and ask for your surgeon's nurse.  Written instructions provided The anatomy & physiology of the abdominal wall was discussed. The pathophysiology of hernias was discussed.  Natural history risks without surgery including progeressive enlargement, pain, incarceration, & strangulation was discussed. Contributors to complications such as smoking, obesity, diabetes, prior surgery, etc were discussed.  I feel the risks of no intervention will lead to serious problems that outweigh the operative risks; therefore, I recommended surgery to reduce and repair the hernia. I explained laparoscopic techniques with possible need for an open approach. I noted the probable use of mesh to patch and/or buttress the hernia repair  Risks such as bleeding, infection, abscess, need for further treatment, heart attack, death, and other risks were discussed. I noted a good likelihood this will help address the problem. Goals of post-operative recovery were discussed as well. Possibility that this will not correct all symptoms was explained. I stressed the importance of low-impact activity, aggressive pain control, avoiding constipation, & not pushing through pain to minimize risk of post-operative chronic pain or injury. Possibility of reherniation especially with smoking, obesity, diabetes, immunosuppression, and other health conditions was discussed. We will work to minimize complications.  An educational handout further explaining the pathology & treatment options was given as well. Questions were answered. The patient expresses understanding & wishes to proceed with surgery.  Pt Education - [facility] Hernia Post-Op HCI (Rajat Staver): discussed with patient and provided information. Pt Education - [facility] Pain Control (Alyria Krack) Pt Education - Pamphlet Given - Laparoscopic Hernia Repair: discussed with patient and provided information. Pt Education - [facility] Mesh education: discussed with patient and provided information.  Adin Hector, MD, FACS, MASCRS Gastrointestinal and Minimally Invasive Surgery    [address] [place], [place] [address] [phone] Main /  Paging [phone] Fax

## 2018-05-21 NOTE — Patient Instructions (Signed)
Salvador Coupe  05/21/2018   Your procedure is scheduled on:  06-03-2018   Report to [facility] Main  Entrance, Report to admitting at  8:30AM    Call this number if you have problems the morning of surgery [phone]       Remember: NO SOLID FOOD AFTER MIDNIGHT THE NIGHT PRIOR TO SURGERY. NOTHING BY MOUTH EXCEPT CLEAR LIQUIDS UNTIL 3 HOURS PRIOR TO [place] SURGERY. PLEASE FINISH ENSURE DRINK PER SURGEON ORDER 3 HOURS PRIOR TO SCHEDULED SURGERY TIME WHICH NEEDS TO BE COMPLETED AT ____7:30 AM_____.                                 BRUSH YOUR TEETH MORNING OF SURGERY AND RINSE YOUR MOUTH OUT, NO CHEWING GUM CANDY OR MINTS.     Take these medicines the morning of surgery with A SIP OF WATER:  Amlodipine (norvasc), Toprol XL                                  You may not have any metal on your body including hair pins and               piercings  Do not wear jewelry, make-up, lotions, powders or perfumes, deodorant              Men may shave face and neck.       Do not bring valuables to the hospital. [place].  Contacts, dentures or bridgework may not be worn into surgery.  Leave suitcase in the car. After surgery it may be brought to your room.     Patients discharged the day of surgery will not be allowed to drive home.  Name and phone number of your driver:     Special Instructions: N/A              Please read over the following fact sheets you were given: _____________________________________________________________________             [facility] - Preparing for Surgery Before surgery, you can play an important role.  Because skin is not sterile, your skin needs to be as free of germs as possible.  You can reduce the number of germs on your skin by washing with CHG (chlorahexidine gluconate) soap before surgery.  CHG is an antiseptic cleaner which kills germs and bonds with the skin to  continue killing germs even after washing. Please DO NOT use if you have an allergy to CHG or antibacterial soaps.  If your skin becomes reddened/irritated stop using the CHG and inform your nurse when you arrive at Short Stay. Do not shave (including legs and underarms) for at least 48 hours prior to the first CHG shower.  You may shave your face/neck. Please follow these instructions carefully:  1.  Shower with CHG Soap the night before surgery and the  morning of Surgery.  2.  If you choose to wash your hair, wash your hair first as usual with your  normal  shampoo.  3.  After you shampoo, rinse your hair and body thoroughly to remove the  shampoo.  4.  Use CHG as you would any other liquid soap.  You can apply chg directly  to the skin and wash                       Gently with a scrungie or clean washcloth.  5.  Apply the CHG Soap to your body ONLY FROM THE NECK DOWN.   Do not use on face/ open                           Wound or open sores. Avoid contact with eyes, ears mouth and genitals (private parts).                       Wash face,  Genitals (private parts) with your normal soap.             6.  Wash thoroughly, paying special attention to the area where your surgery  will be performed.  7.  Thoroughly rinse your body with warm water from the neck down.  8.  DO NOT shower/wash with your normal soap after using and rinsing off  the CHG Soap.             9.  Pat yourself dry with a clean towel.            10.  Wear clean pajamas.            11.  Place clean sheets on your bed the night of your first shower and do not  sleep with pets. Day of Surgery : Do not apply any lotions/deodorants the morning of surgery.  Please wear clean clothes to the hospital/surgery center.  FAILURE TO FOLLOW THESE INSTRUCTIONS MAY RESULT IN THE CANCELLATION OF YOUR SURGERY PATIENT SIGNATURE_________________________________  NURSE  SIGNATURE__________________________________  ________________________________________________________________________

## 2018-05-24 ENCOUNTER — Other Ambulatory Visit: Payer: Self-pay

## 2018-05-24 ENCOUNTER — Encounter (HOSPITAL_COMMUNITY)
Admission: RE | Admit: 2018-05-24 | Discharge: 2018-05-24 | Disposition: A | Discharge status: Home / Self Care | Payer: Medicare Other | Source: Ambulatory Visit | Attending: Surgery | Admitting: Surgery

## 2018-05-24 ENCOUNTER — Encounter (HOSPITAL_COMMUNITY): Payer: Self-pay

## 2018-05-24 DIAGNOSIS — Z01818 Encounter for other preprocedural examination: Secondary | ICD-10-CM | POA: Diagnosis not present

## 2018-05-24 LAB — BASIC METABOLIC PANEL
Anion gap: 6 (ref 5–15)
BUN: 17 mg/dL (ref 8–23)
CO2: 27 mmol/L (ref 22–32)
Calcium: 9.5 mg/dL (ref 8.9–10.3)
Chloride: 108 mmol/L (ref 98–111)
Creatinine, Ser: 0.94 mg/dL (ref 0.61–1.24)
GFR calc Af Amer: >60 mL/min (ref >60)
GFR calc non Af Amer: >60 mL/min (ref >60)
Glucose, Bld: 115 mg/dL — ABNORMAL HIGH (ref 70–99)
Potassium: 4.4 mmol/L (ref 3.5–5.1)
Sodium: 141 mmol/L (ref 135–145)

## 2018-05-24 LAB — CBC
HCT: 42.3 % (ref 39.0–52.0)
Hemoglobin: 13.5 g/dL (ref 13.0–17.0)
MCH: 28.7 pg (ref 26.0–34.0)
MCHC: 31.9 g/dL (ref 30.0–36.0)
MCV: 89.8 fL (ref 80.0–100.0)
Platelets: 207 10*3/uL (ref 150–400)
RBC: 4.71 MIL/uL (ref 4.22–5.81)
RDW: 12.4 % (ref 11.5–15.5)
WBC: 4.9 10*3/uL (ref 4.0–10.5)
nRBC: 0 % (ref 0.0–0.2)

## 2018-05-25 NOTE — Progress Notes (Signed)
Chart left with Ihechi for follow up 

## 2018-06-02 MED ORDER — BUPIVACAINE LIPOSOME 1.3 % IJ SUSP
20.0000 mL | INTRAMUSCULAR | Status: DC
  Filled 2018-06-02: qty 20

## 2018-06-03 ENCOUNTER — Ambulatory Visit (HOSPITAL_COMMUNITY): Payer: Medicare Other | Admitting: Anesthesiology

## 2018-06-03 ENCOUNTER — Ambulatory Visit (HOSPITAL_COMMUNITY)
Admission: RE | Admit: 2018-06-03 | Discharge: 2018-06-03 | Disposition: A | Discharge status: Home / Self Care | Payer: Medicare Other | Source: Ambulatory Visit | Attending: Surgery | Admitting: Surgery

## 2018-06-03 ENCOUNTER — Encounter (HOSPITAL_COMMUNITY)
Admission: RE | Disposition: A | Discharge status: Home / Self Care | Payer: Self-pay | Source: Ambulatory Visit | Attending: Surgery

## 2018-06-03 ENCOUNTER — Encounter (HOSPITAL_COMMUNITY): Payer: Self-pay | Admitting: *Deleted

## 2018-06-03 DIAGNOSIS — Z8261 Family history of arthritis: Secondary | ICD-10-CM | POA: Diagnosis not present

## 2018-06-03 DIAGNOSIS — Z8249 Family history of ischemic heart disease and other diseases of the circulatory system: Secondary | ICD-10-CM | POA: Insufficient documentation

## 2018-06-03 DIAGNOSIS — D649 Anemia, unspecified: Secondary | ICD-10-CM | POA: Insufficient documentation

## 2018-06-03 DIAGNOSIS — M6208 Separation of muscle (nontraumatic), other site: Secondary | ICD-10-CM | POA: Insufficient documentation

## 2018-06-03 DIAGNOSIS — K439 Ventral hernia without obstruction or gangrene: Secondary | ICD-10-CM

## 2018-06-03 DIAGNOSIS — K436 Other and unspecified ventral hernia with obstruction, without gangrene: Secondary | ICD-10-CM | POA: Diagnosis not present

## 2018-06-03 DIAGNOSIS — Z85038 Personal history of other malignant neoplasm of large intestine: Secondary | ICD-10-CM | POA: Insufficient documentation

## 2018-06-03 DIAGNOSIS — K760 Fatty (change of) liver, not elsewhere classified: Secondary | ICD-10-CM | POA: Diagnosis not present

## 2018-06-03 DIAGNOSIS — Z86010 Personal history of colonic polyps: Secondary | ICD-10-CM | POA: Diagnosis not present

## 2018-06-03 HISTORY — PX: VENTRAL HERNIA REPAIR: SHX424

## 2018-06-03 SURGERY — REPAIR, HERNIA, VENTRAL, LAPAROSCOPIC
Anesthesia: General

## 2018-06-03 MED ORDER — TRAMADOL HCL 50 MG PO TABS: 50.0000 mg | ORAL_TABLET | Freq: Four times a day (QID) | ORAL | 0 refills | Status: DC | PRN

## 2018-06-03 MED ORDER — GLYCOPYRROLATE PF 0.2 MG/ML IJ SOSY
PREFILLED_SYRINGE | INTRAMUSCULAR | Status: DC | PRN
  Administered 2018-06-03: .2 mg via INTRAVENOUS

## 2018-06-03 MED ORDER — PROPOFOL 10 MG/ML IV BOLUS
INTRAVENOUS | Status: DC | PRN
  Administered 2018-06-03: 160 mg via INTRAVENOUS

## 2018-06-03 MED ORDER — BUPIVACAINE-EPINEPHRINE 0.25% -1:200000 IJ SOLN
INTRAMUSCULAR | Status: DC | PRN
  Administered 2018-06-03: 60 mL

## 2018-06-03 MED ORDER — DEXAMETHASONE SODIUM PHOSPHATE 10 MG/ML IJ SOLN
INTRAMUSCULAR | Status: AC
  Filled 2018-06-03: qty 1

## 2018-06-03 MED ORDER — KETAMINE HCL 10 MG/ML IJ SOLN
INTRAMUSCULAR | Status: AC
  Filled 2018-06-03: qty 1

## 2018-06-03 MED ORDER — PHENYLEPHRINE 40 MCG/ML (10ML) SYRINGE FOR IV PUSH (FOR BLOOD PRESSURE SUPPORT)
PREFILLED_SYRINGE | INTRAVENOUS | Status: AC
  Filled 2018-06-03: qty 10

## 2018-06-03 MED ORDER — ACETAMINOPHEN 500 MG PO TABS
1000.0000 mg | ORAL_TABLET | ORAL | Status: AC
  Administered 2018-06-03: 1000 mg via ORAL
  Filled 2018-06-03: qty 2

## 2018-06-03 MED ORDER — ROCURONIUM BROMIDE 10 MG/ML (PF) SYRINGE
PREFILLED_SYRINGE | INTRAVENOUS | Status: DC | PRN
  Administered 2018-06-03: 20 mg via INTRAVENOUS
  Administered 2018-06-03: 10 mg via INTRAVENOUS
  Administered 2018-06-03: 50 mg via INTRAVENOUS

## 2018-06-03 MED ORDER — ONDANSETRON HCL 4 MG/2ML IJ SOLN
INTRAMUSCULAR | Status: AC
  Filled 2018-06-03: qty 2

## 2018-06-03 MED ORDER — SUGAMMADEX SODIUM 500 MG/5ML IV SOLN
INTRAVENOUS | Status: AC
  Filled 2018-06-03: qty 5

## 2018-06-03 MED ORDER — TRAMADOL HCL 50 MG PO TABS
50.0000 mg | ORAL_TABLET | Freq: Once | ORAL | Status: AC | PRN
  Administered 2018-06-03: 50 mg via ORAL

## 2018-06-03 MED ORDER — GABAPENTIN 300 MG PO CAPS
300.0000 mg | ORAL_CAPSULE | ORAL | Status: AC
  Administered 2018-06-03: 300 mg via ORAL
  Filled 2018-06-03: qty 1

## 2018-06-03 MED ORDER — LIDOCAINE 2% (20 MG/ML) 5 ML SYRINGE
INTRAMUSCULAR | Status: AC
  Filled 2018-06-03: qty 10

## 2018-06-03 MED ORDER — LACTATED RINGERS IV SOLN
INTRAVENOUS | Status: DC
  Administered 2018-06-03 (×2): via INTRAVENOUS

## 2018-06-03 MED ORDER — CEFAZOLIN SODIUM-DEXTROSE 2-4 GM/100ML-% IV SOLN
2.0000 g | INTRAVENOUS | Status: AC
  Administered 2018-06-03: 2 g via INTRAVENOUS
  Filled 2018-06-03: qty 100

## 2018-06-03 MED ORDER — LIDOCAINE 2% (20 MG/ML) 5 ML SYRINGE
INTRAMUSCULAR | Status: DC | PRN
  Administered 2018-06-03: 60 mg via INTRAVENOUS

## 2018-06-03 MED ORDER — BUPIVACAINE-EPINEPHRINE (PF) 0.25% -1:200000 IJ SOLN
INTRAMUSCULAR | Status: AC
  Filled 2018-06-03: qty 60

## 2018-06-03 MED ORDER — SUGAMMADEX SODIUM 200 MG/2ML IV SOLN
INTRAVENOUS | Status: DC | PRN
  Administered 2018-06-03: 400 mg via INTRAVENOUS

## 2018-06-03 MED ORDER — BUPIVACAINE LIPOSOME 1.3 % IJ SUSP
INTRAMUSCULAR | Status: DC | PRN
  Administered 2018-06-03: 20 mL

## 2018-06-03 MED ORDER — TRAMADOL HCL 50 MG PO TABS
ORAL_TABLET | ORAL | Status: AC
  Administered 2018-06-03: 50 mg via ORAL
  Filled 2018-06-03: qty 1

## 2018-06-03 MED ORDER — FENTANYL CITRATE (PF) 250 MCG/5ML IJ SOLN
INTRAMUSCULAR | Status: AC
  Filled 2018-06-03: qty 5

## 2018-06-03 MED ORDER — KETAMINE HCL 10 MG/ML IJ SOLN
INTRAMUSCULAR | Status: DC | PRN
  Administered 2018-06-03: 30 mg via INTRAVENOUS

## 2018-06-03 MED ORDER — FENTANYL CITRATE (PF) 100 MCG/2ML IJ SOLN
25.0000 ug | INTRAMUSCULAR | Status: DC | PRN
  Administered 2018-06-03: 25 ug via INTRAVENOUS

## 2018-06-03 MED ORDER — PROPOFOL 10 MG/ML IV BOLUS
INTRAVENOUS | Status: AC
  Filled 2018-06-03: qty 20

## 2018-06-03 MED ORDER — 0.9 % SODIUM CHLORIDE (POUR BTL) OPTIME
TOPICAL | Status: DC | PRN
  Administered 2018-06-03: 1000 mL

## 2018-06-03 MED ORDER — ONDANSETRON HCL 4 MG/2ML IJ SOLN
INTRAMUSCULAR | Status: DC | PRN
  Administered 2018-06-03: 4 mg via INTRAVENOUS

## 2018-06-03 MED ORDER — EPHEDRINE 5 MG/ML INJ
INTRAVENOUS | Status: AC
  Filled 2018-06-03: qty 20

## 2018-06-03 MED ORDER — LIDOCAINE 2% (20 MG/ML) 5 ML SYRINGE
INTRAMUSCULAR | Status: DC | PRN
  Administered 2018-06-03: 5.4 mL/h via INTRAVENOUS

## 2018-06-03 MED ORDER — FENTANYL CITRATE (PF) 100 MCG/2ML IJ SOLN
INTRAMUSCULAR | Status: DC | PRN
  Administered 2018-06-03 (×2): 50 ug via INTRAVENOUS
  Administered 2018-06-03: 100 ug via INTRAVENOUS

## 2018-06-03 MED ORDER — EPHEDRINE SULFATE-NACL 50-0.9 MG/10ML-% IV SOSY
PREFILLED_SYRINGE | INTRAVENOUS | Status: DC | PRN
  Administered 2018-06-03: 15 mg via INTRAVENOUS

## 2018-06-03 MED ORDER — FENTANYL CITRATE (PF) 100 MCG/2ML IJ SOLN
INTRAMUSCULAR | Status: AC
  Filled 2018-06-03: qty 2

## 2018-06-03 MED ORDER — DEXAMETHASONE SODIUM PHOSPHATE 10 MG/ML IJ SOLN
INTRAMUSCULAR | Status: DC | PRN
  Administered 2018-06-03: 8 mg via INTRAVENOUS

## 2018-06-03 MED FILL — traMADol HCL 50 MG TABS: 50 | 3 days supply | Qty: 20 | Fill #0

## 2018-06-03 SURGICAL SUPPLY — 47 items
APPLIER CLIP 5 13 M/L LIGAMAX5 (MISCELLANEOUS)
BINDER ABDOMINAL 12 ML 46-62 (SOFTGOODS) ×2 IMPLANT
CABLE HIGH FREQUENCY MONO STRZ (ELECTRODE) ×2 IMPLANT
CHLORAPREP W/TINT 26ML (MISCELLANEOUS) ×2 IMPLANT
CLIP APPLIE 5 13 M/L LIGAMAX5 (MISCELLANEOUS) IMPLANT
COVER SURGICAL LIGHT HANDLE (MISCELLANEOUS) ×2 IMPLANT
COVER WAND RF STERILE (DRAPES) ×2 IMPLANT
DEVICE SECURE STRAP 25 ABSORB (INSTRUMENTS) ×2 IMPLANT
DEVICE TROCAR PUNCTURE CLOSURE (ENDOMECHANICALS) ×2 IMPLANT
DRAPE WARM FLUID 44X44 (DRAPE) ×2 IMPLANT
DRSG TEGADERM 2-3/8X2-3/4 SM (GAUZE/BANDAGES/DRESSINGS) ×2 IMPLANT
DRSG TEGADERM 4X4.75 (GAUZE/BANDAGES/DRESSINGS) ×2 IMPLANT
ELECT REM PT RETURN 15FT ADLT (MISCELLANEOUS) ×2 IMPLANT
GAUZE SPONGE 2X2 8PLY STRL LF (GAUZE/BANDAGES/DRESSINGS) ×1 IMPLANT
GLOVE BIO SURGEON STRL SZ 6.5 (GLOVE) ×2 IMPLANT
GLOVE BIOGEL PI IND STRL 6.5 (GLOVE) ×2 IMPLANT
GLOVE BIOGEL PI IND STRL 7.0 (GLOVE) ×1 IMPLANT
GLOVE BIOGEL PI INDICATOR 6.5 (GLOVE) ×2
GLOVE BIOGEL PI INDICATOR 7.0 (GLOVE) ×1
GLOVE ECLIPSE 8.0 STRL XLNG CF (GLOVE) ×2 IMPLANT
GLOVE INDICATOR 8.0 STRL GRN (GLOVE) ×2 IMPLANT
GOWN STRL REUS W/ TWL LRG LVL3 (GOWN DISPOSABLE) ×1 IMPLANT
GOWN STRL REUS W/TWL LRG LVL3 (GOWN DISPOSABLE) ×1
GOWN STRL REUS W/TWL XL LVL3 (GOWN DISPOSABLE) ×4 IMPLANT
IRRIG SUCT STRYKERFLOW 2 WTIP (MISCELLANEOUS)
IRRIGATION SUCT STRKRFLW 2 WTP (MISCELLANEOUS) IMPLANT
KIT BASIN OR (CUSTOM PROCEDURE TRAY) ×2 IMPLANT
MARKER SKIN DUAL TIP RULER LAB (MISCELLANEOUS) ×2 IMPLANT
MESH VENTRALIGHT ST 6X8 (Mesh Specialty) ×1 IMPLANT
MESH VENTRLGHT ELLIPSE 8X6XMFL (Mesh Specialty) ×1 IMPLANT
NEEDLE SPNL 22GX3.5 QUINCKE BK (NEEDLE) ×2 IMPLANT
PAD POSITIONING PINK XL (MISCELLANEOUS) ×2 IMPLANT
SCISSORS LAP 5X35 DISP (ENDOMECHANICALS) ×2 IMPLANT
SHEARS HARMONIC ACE PLUS 36CM (ENDOMECHANICALS) IMPLANT
SLEEVE ADV FIXATION 5X100MM (TROCAR) ×2 IMPLANT
SPONGE GAUZE 2X2 STER 10/PKG (GAUZE/BANDAGES/DRESSINGS) ×1
STRIP CLOSURE SKIN 1/2X4 (GAUZE/BANDAGES/DRESSINGS) ×2 IMPLANT
SUT MNCRL AB 4-0 PS2 18 (SUTURE) ×4 IMPLANT
SUT PDS AB 1 CT1 27 (SUTURE) ×8 IMPLANT
SUT PROLENE 1 CT 1 30 (SUTURE) ×12 IMPLANT
SUT VIC AB 4-0 P2 18 (SUTURE) ×2 IMPLANT
TOWEL OR 17X26 10 PK STRL BLUE (TOWEL DISPOSABLE) ×2 IMPLANT
TRAY LAPAROSCOPIC (CUSTOM PROCEDURE TRAY) ×2 IMPLANT
TROCAR ADV FIXATION 11X100MM (TROCAR) IMPLANT
TROCAR ADV FIXATION 5X100MM (TROCAR) ×2 IMPLANT
TROCAR BLADELESS OPT 5 100 (ENDOMECHANICALS) ×2 IMPLANT
TUBING INSUF HEATED (TUBING) ×2 IMPLANT

## 2018-06-03 NOTE — Discharge Instructions (Signed)
HERNIA REPAIR: POST OP INSTRUCTIONS ° °###################################################################### ° °EAT °Gradually transition to a high fiber diet with a fiber supplement over the next few weeks after discharge.  Start with a pureed / full liquid diet (see below) ° °WALK °Walk an hour a day.  Control your pain to do that.   ° °CONTROL PAIN °Control pain so that you can walk, sleep, tolerate sneezing/coughing, and go up/down stairs. ° °HAVE A BOWEL MOVEMENT DAILY °Keep your bowels regular to avoid problems.  OK to try a laxative to override constipation.  OK to use an antidairrheal to slow down diarrhea.  Call if not better after 2 tries ° °CALL IF YOU HAVE PROBLEMS/CONCERNS °Call if you are still struggling despite following these instructions. °Call if you have concerns not answered by these instructions ° °###################################################################### ° ° ° °1. DIET: Follow a light bland diet the first 24 hours after arrival home, such as soup, liquids, crackers, etc.  Be sure to include lots of fluids daily.  Advance to a low fat / high fiber diet over the next few days after surgery.  Avoid fast food or heavy meals the first week as your are more likely to get nauseated.   ° °2. Take your usually prescribed home medications unless otherwise directed. ° °3. PAIN CONTROL: °a. Pain is best controlled by a usual combination of three different methods TOGETHER: °i. Ice/Heat °ii. Over the counter pain medication °iii. Prescription pain medication °b. Most patients will experience some swelling and bruising around the hernia(s) such as the bellybutton, groins, or old incisions.  Ice packs or heating pads (30-60 minutes up to 6 times a day) will help. Use ice for the first few days to help decrease swelling and bruising, then switch to heat to help relax tight/sore spots and speed recovery.  Some people prefer to use ice alone, heat alone, alternating between ice & heat.  Experiment  to what works for you.  Swelling and bruising can take several weeks to resolve.   °c. It is helpful to take an over-the-counter pain medication regularly for the first few weeks.  Choose one of the following that works best for you: °i. Naproxen (Aleve, etc)  Two 220mg tabs twice a day °ii. Ibuprofen (Advil, etc) Three 200mg tabs four times a day (every meal & bedtime) °iii. Acetaminophen (Tylenol, etc) 325-650mg four times a day (every meal & bedtime) °d. A  prescription for pain medication should be given to you upon discharge.  Take your pain medication as prescribed.  °i. If you are having problems/concerns with the prescription medicine (does not control pain, nausea, vomiting, rash, itching, etc), please call us [phone] to see if we need to switch you to a different pain medicine that will work better for you and/or control your side effect better. °ii. If you need a refill on your pain medication, please contact your pharmacy.  They will contact our office to request authorization. Prescriptions will not be filled after 5 pm or on week-ends. ° °4. Avoid getting constipated.  Between the surgery and the pain medications, it is common to experience some constipation.  Increasing fluid intake and taking a fiber supplement (such as Metamucil, Citrucel, FiberCon, MiraLax, etc) 1-2 times a day regularly will usually help prevent this problem from occurring.  A mild laxative (prune juice, Milk of Magnesia, MiraLax, etc) should be taken according to package directions if there are no bowel movements after 48 hours.   ° °5. Wash / shower every   day.  You may shower over the dressings as they are waterproof.   ° °6. Remove your waterproof bandages, skin tapes, and other bandages 5 days after surgery. You may replace a dressing/Band-Aid to cover the incision for comfort if you wish. You may leave the incisions open to air.  You may replace a dressing/Band-Aid to cover an incision for comfort if you wish.   Continue to shower over incision(s) after the dressing is off. ° °7. ACTIVITIES as tolerated:   °a. You may resume regular (light) daily activities beginning the next day--such as daily self-care, walking, climbing stairs--gradually increasing activities as tolerated.  Control your pain so that you can walk an hour a day.  If you can walk 30 minutes without difficulty, it is safe to try more intense activity such as jogging, treadmill, bicycling, low-impact aerobics, swimming, etc. °b. Save the most intensive and strenuous activity for last such as sit-ups, heavy lifting, contact sports, etc  Refrain from any heavy lifting or straining until you are off narcotics for pain control.   °c. DO NOT PUSH THROUGH PAIN.  Let pain be your guide: If it hurts to do something, don't do it.  Pain is your body warning you to avoid that activity for another week until the pain goes down. °d. You may drive when you are no longer taking prescription pain medication, you can comfortably wear a seatbelt, and you can safely maneuver your car and apply brakes. °e. You may have sexual intercourse when it is comfortable.  ° °8. FOLLOW UP in our office °a. Please call [facility] at [phone] to set up an appointment to see your surgeon in the office for a follow-up appointment approximately 2-3 weeks after your surgery. °b. Make sure that you call for this appointment the day you arrive home to insure a convenient appointment time. ° °9.  If you have disability of FMLA / Family leave forms, please bring the forms to the office for processing.  (do not give to your surgeon). ° °WHEN TO CALL US [phone]: °1. Poor pain control °2. Reactions / problems with new medications (rash/itching, nausea, etc)  °3. Fever over 101.5 F (38.5 C) °4. Inability to urinate °5. Nausea and/or vomiting °6. Worsening swelling or bruising °7. Continued bleeding from incision. °8. Increased pain, redness, or drainage from the incision ° ° The clinic staff is  available to answer your questions during regular business hours (8:30am-5pm).  Please don’t hesitate to call and ask to speak to one of our nurses for clinical concerns.  ° If you have a medical emergency, go to the nearest emergency room or call 911. ° A surgeon from [facility] is always on call at the hospitals in [place] ° °[facility] °[address], [address], [place], [place]  [address] ? ° [address] [address], [place], [place]   [address] °MAIN: [phone] ? TOLL FREE: [phone] ? FAX: [phone] °[email or web address] ° °

## 2018-06-03 NOTE — Transfer of Care (Signed)
Immediate Anesthesia Transfer of Care Note  Patient: [name]  Procedure(s) Performed: LAPAROSCOPIC VENTRAL WALL  HERNIA  REPAIR WITH MESH  ERAS PATHWAY (N/A )  Patient Location: PACU  Anesthesia Type:General  Level of Consciousness: sedated, patient cooperative and responds to stimulation  Airway & Oxygen Therapy: Patient Spontanous Breathing and Patient connected to face mask oxygen  Post-op Assessment: Report given to RN and Post -op Vital signs reviewed and stable  Post vital signs: Reviewed and stable  Last Vitals:  Vitals Value Taken Time  BP    Temp    Pulse 82 06/03/2018  1:47 PM  Resp 13 06/03/2018  1:47 PM  SpO2 97 % 06/03/2018  1:47 PM  Vitals shown include unvalidated device data.  Last Pain:  Vitals:   06/03/18 0831  TempSrc: Oral         Complications: No apparent anesthesia complications

## 2018-06-03 NOTE — Interval H&P Note (Signed)
History and Physical Interval Note:  06/03/2018 10:41 AM  [name]  has presented today for surgery, with the diagnosis of periumbilical ventral hernia in the setting of diastases recti  The various methods of treatment have been discussed with the patient and family. After consideration of risks, benefits and other options for treatment, the patient has consented to  Procedure(s): [place] (N/A) as a surgical intervention .  The patient's history has been reviewed, patient examined, no change in status, stable for surgery.  I have reviewed the patient's chart and labs.  Questions were answered to the patient's satisfaction.    I have re-reviewed the the patient's records, history, medications, and allergies.  I have re-examined the patient.  I again discussed intraoperative plans and goals of post-operative recovery.  The patient agrees to proceed.  [name]  1945-10-05 [id number]  Patient Care Team: Mosie Lukes, MD as PCP - General (Family Medicine)  Patient Active Problem List   Diagnosis Date Noted  . Hemorrhoids 08/22/2016  . Anemia 03/16/2016  . Right shoulder pain 02/26/2016  . Abnormal CT scan, gastrointestinal tract suspect angioedema   . Abdominal pain   . Hyperglycemia 08/21/2013  . Low back pain 10/10/2012  . Hyperlipidemia   . Hypertension, essential    . History of colon polyps   . Obesity (BMI 30.0-34.9)     Past Medical History:  Diagnosis Date  . Abnormal CT scan, gastrointestinal tract suspect angioedema   . Anemia 03/16/2016   IRON  . Anxiety   . Arthritis   . Back pain 10/10/2012  . Fatty liver 2005   seen on 2005 ultrasound,05/2015 CT.   Marland Kitchen Hemorrhoids 08/22/2016  . Hemorrhoids, internal, with bleeding 2005   internal and external. 2005 band ligation (dr Ferdinand Lango in [facility])  . Hyperglycemia 08/21/2013  . Hyperlipidemia   . Hypertension   . Nocturia 08/21/2013  . Obesity (BMI  30.0-34.9)   . Osteoporosis   . Pneumonia    as a child  . Rectal bleeding 07/30/2014  . Renal cyst   . SBO (small bowel obstruction) ([place])   . 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 Unspecified constipation 02/06/2014  . Vitamin D deficiency     Past Surgical History:  Procedure Laterality Date  . COLONOSCOPY  before 2005, 2012, 2013, 08/2014.   Marland Kitchen ENTEROSCOPY N/A 10/05/2017   Procedure: ENTEROSCOPY;  Surgeon: Gatha Mayer, MD;  Location: [facility] ENDOSCOPY;  Service: Endoscopy;  Laterality: N/A;  . FRACTURE SURGERY Left 1989   leg  . HEMORRHOID BANDING  2005  . KNEE SURGERY Left 2010   arthroscopy, torn carilage  . KNEE SURGERY Right 2012   arthroscopy  . TONSILLECTOMY  1957    Social History   Socioeconomic History  . Marital status: Married    Spouse name: Not on file  . Number of children: 1  . Years of education: Not on file  . Highest education level: Not on file  Occupational History  . Occupation: Retired    Fish farm manager: [employer]  . Financial resource strain: Not on file  . Food insecurity:    Worry: Not on file    Inability: Not on file  . Transportation needs:    Medical: Not on file    Non-medical: Not on file  Tobacco Use  . Smoking status: Never Smoker  . Smokeless tobacco: Never Used  Substance and  Sexual Activity  . Alcohol use: Yes    Alcohol/week: 2.0 standard drinks    Types: 2 Standard drinks or equivalent per week    Comment: Occassionally  . Drug use: No  . Sexual activity: Yes  Lifestyle  . Physical activity:    Days per week: Not on file    Minutes per session: Not on file  . Stress: Not on file  Relationships  . Social connections:    Talks on phone: Not on file    Gets together: Not on file    Attends religious service: Not on file    Active member of club or organization: Not on file    Attends meetings of clubs or organizations: Not on file    Relationship status: Not on file  . Intimate  partner violence:    Fear of current or ex partner: Not on file    Emotionally abused: Not on file    Physically abused: Not on file    Forced sexual activity: Not on file  Other Topics Concern  . Not on file  Social History Narrative   Married - 1 child   Retired from [employer], no tbacco/drug use    Family History  Problem Relation Age of Onset  . Stroke Mother   . Aneurysm Mother        brain  . Arthritis Father   . Liver cancer Father        liver, atomic testing in [employer]  . Gallbladder disease Father   . Hyperlipidemia Sister   . Hypertension Sister   . Obesity Sister   . Diabetes Sister   . Diabetes Brother   . Hyperlipidemia Sister   . Hypertension Sister   . Obesity Sister   . Diabetes Sister   . Aneurysm Brother        brain  . COPD Brother   . Gallbladder disease Sister        x4  . Colon cancer Neg Hx   . Colon polyps Neg Hx   . Esophageal cancer Neg Hx   . Rectal cancer Neg Hx   . Stomach cancer Neg Hx     Medications Prior to Admission  Medication Sig Dispense Refill Last Dose  . amLODipine (NORVASC) 5 MG tablet TAKE 1 TABLET DAILY (Patient taking differently: Take 5 mg by mouth daily. ) 90 tablet 4 06/03/2018 at 0600  . azelastine (OPTIVAR) 0.05 % ophthalmic solution Place 1 drop into the left eye 2 (two) times daily. (Patient taking differently: Place 1 drop into the left eye 2 (two) times daily as needed (for itching). ) 6 mL 1 Past Month at Unknown time  . fluticasone (FLONASE) 50 MCG/ACT nasal spray Place 2 sprays into both nostrils daily as needed for allergies or rhinitis.   Past Month at Unknown time  . ibuprofen (ADVIL,MOTRIN) 200 MG tablet Take 400 mg by mouth every 6 (six) hours as needed for moderate pain.   Past Week at Unknown time  . losartan (COZAAR) 50 MG tablet TAKE 1 TABLET DAILY (Patient taking differently: Take 50 mg by mouth daily. ) 90 tablet 4 06/03/2018 at 0600  . Multiple Vitamins-Minerals (MULTIVITAMIN ADULT PO) Take  1 tablet by mouth daily.   06/02/2018 at Unknown time  . Pitavastatin Calcium (LIVALO) 2 MG TABS TAKE 1 TABLET EVERY DAY (Patient taking differently: Take 2 mg by mouth daily. TAKE 1 TABLET EVERY DAY) 90 tablet 4 06/03/2018 at 0600  . polyethylene  glycol (MIRALAX / GLYCOLAX) packet Take 17 g by mouth every Monday, Wednesday, and Friday.    06/02/2018 at Unknown time  . TOPROL XL 50 MG 24 hr tablet TAKE 1 TABLET DAILY . TAKE WITH OR IMMEDIATELY FOLLOWING A MEAL (Patient taking differently: Take 50 mg by mouth daily. ) 90 tablet 4 06/03/2018 at 0600  . ALPRAZolam (XANAX) 0.5 MG tablet Take 1 tablet (0.5 mg total) by mouth 2 (two) times daily as needed for anxiety. (Patient not taking: Reported on 05/20/2018) 10 tablet 1 Not Taking at Unknown time  . ALPRAZolam (XANAX) 1 MG tablet Take 1 mg by mouth at bedtime as needed for anxiety.   More than a month at Unknown time  . Ferrous Fumarate-Folic Acid 500-9 MG TABS Take 1 mg by mouth daily. (Patient not taking: Reported on 05/20/2018) 30 each 1 Not Taking at Unknown time  . hydrocortisone (HEMMOREX-HC) 25 MG suppository Place 1 suppository (25 mg total) rectally 2 (two) times daily as needed for hemorrhoids or anal itching. 12 suppository 3 More than a month at Unknown time  . hyoscyamine (LEVSIN SL) 0.125 MG SL tablet Place 1 tablet (0.125 mg total) under the tongue every 4 (four) hours as needed for cramping. (Patient not taking: Reported on 05/20/2018) 30 tablet 1 Not Taking at Unknown time  . ibuprofen (ADVIL,MOTRIN) 400 MG tablet Take 1 tablet (400 mg total) by mouth every 8 (eight) hours as needed. (Patient not taking: Reported on 05/20/2018) 40 tablet 3 Not Taking at Unknown time  . tiZANidine (ZANAFLEX) 4 MG tablet Take 1 tablet (4 mg total) by mouth 2 (two) times daily as needed for muscle spasms. (Patient not taking: Reported on 05/20/2018) 30 tablet 1 Not Taking at Unknown time    Current Facility-Administered Medications  Medication Dose Route  Frequency Provider Last Rate Last Dose  . bupivacaine liposome (EXPAREL) 1.3 % injection 266 mg  20 mL Infiltration On Call to OR Michael Boston, MD      . ceFAZolin (ANCEF) IVPB 2g/100 mL premix  2 g Intravenous On Call to OR Michael Boston, MD      . lactated ringers infusion   Intravenous Continuous Freddrick March, MD 50 mL/hr at 06/03/18 0902       No Known Allergies  BP (!) 141/90   Pulse 75   Temp 98.5 F (36.9 C) (Oral)   Resp 18   SpO2 99%   Labs: No results found for this or any previous visit (from the past 48 hour(s)).  Imaging / Studies: No results found.   Adin Hector, M.D., F.A.C.S. Gastrointestinal and Minimally Invasive Surgery [facility] [address], [place], [place] [address] [phone] Main / Paging  06/03/2018 10:42 AM     Adin Hector

## 2018-06-03 NOTE — Op Note (Signed)
06/03/2018  PATIENT:  [name]  72 y.o. male  Patient Care Team: Mosie Lukes, MD as PCP - General (Family Medicine) Michael Boston, MD as Consulting Physician (General Surgery)  PRE-OPERATIVE DIAGNOSIS:  Periumbilical ventral hernia in the setting of diastases recti  POST-OPERATIVE DIAGNOSIS:   Periumbilical ventral hernia in the setting of diastases recti  PROCEDURE: LAPAROSCOPIC [place]    SURGEON:  Adin Hector, MD  ASSISTANT: Vicki Mallet, RNFA  ANESTHESIA:     General  Nerve block provided with liposomal bupivacaine (Experel) mixed with 0.25% bupivacaine as a Bilateral TAP block x30 mL at the level of the transverse abdominis & preperitoneal spaces along the flank at the anterior axillary line, from subcostal ridge to iliac crest under laparoscopic guidance   Local anesthesia field block: (0.25% bupivacaine & liposomal  Bupivacaine [Experel])  EBL:  Total I/O In: 1000 [I.V.:1000] Out: 25 [Blood:25]  Per anesthesia record  Delay start of Pharmacological VTE agent (>24hrs) due to surgical blood loss or risk of bleeding:  no  DRAINS: none   SPECIMEN:  No Specimen  DISPOSITION OF SPECIMEN:  N/A  COUNTS:  YES  PLAN OF CARE: Discharge to home after PACU  PATIENT DISPOSITION:  PACU - hemodynamically stable.  INDICATION: Pleasant patient has developed a ventral wall abdominal hernia.   Recommendation was made for surgical repair:  The anatomy & physiology of the abdominal wall was discussed. The pathophysiology of hernias was discussed. Natural history risks without surgery including progeressive enlargement, pain, incarceration & strangulation was discussed. Contributors to complications such as smoking, obesity, diabetes, prior surgery, etc were discussed.  I feel the risks of no intervention will lead to serious problems that outweigh the operative risks; therefore, I recommended surgery to reduce and repair the hernia. I  explained laparoscopic techniques with possible need for an open approach. I noted the probable use of mesh to patch and/or buttress the hernia repair  Risks such as bleeding, infection, abscess, need for further treatment, heart attack, death, and other risks were discussed. I noted a good likelihood this will help address the problem. Goals of post-operative recovery were discussed as well. Possibility that this will not correct all symptoms was explained. I stressed the importance of low-impact activity, aggressive pain control, avoiding constipation, & not pushing through pain to minimize risk of post-operative chronic pain or injury. Possibility of reherniation especially with smoking, obesity, diabetes, immunosuppression, and other health conditions was discussed. We will work to minimize complications.  An educational handout further explaining the pathology & treatment options was given as well. Questions were answered. The patient expresses understanding & wishes to proceed with surgery.   OR FINDINGS: 4 x 3 cm periumbilical ventral hernia in the setting of diastases recti.  No incarceration.  Type of repair: Laparoscopic underlay repair   Placement of mesh: Intraperitoneal underlay repair  Name of mesh: Bard Ventralight dual sided (polypropylene / Seprafilm)  Size of mesh: 20x15cm  Orientation: Transverse  Mesh overlap:  5-7cm   DESCRIPTION:   Informed consent was confirmed. The patient underwent general anaesthesia without difficulty. The patient was positioned appropriately. VTE prevention in place. The patient's abdomen was clipped, prepped, & draped in a sterile fashion. Surgical timeout confirmed our plan.  The patient was positioned in reverse Trendelenburg. Abdominal entry was gained using optical entry technique in the left upper abdomen. Entry was clean. I induced carbon dioxide insufflation. Camera inspection revealed no injury. Extra ports were carefully placed  under direct  laparoscopic visualization.   I could see the hernia on the prietal peritoneum under the abdominal wall.  I freed the peritoneum and falciform ligament from the subxiphoid region down infraumbilically.  Reduced out hernia sac as well.  Confirmed there were no other hernias aside from the periumbilical line.  He had some adhesions in the right upper quadrant that were primarily greater omentum.  He is partially freed down but most on the liver were left in situ.  I primarily used focused sharp dissection.    I made sure hemostasis was good.  I mapped out the region using a needle passer.   To ensure that I would have at least 5 cm radial coverage outside of the hernia defect, I chose a 20x15cm dual sided mesh.  I placed #1 Prolene stitches around its edge about every 5 cm = 12 total.  I rolled the mesh & placed into the peritoneal cavity through the hernia defect.  I unrolled the mesh and positioned it appropriately.  I secured the mesh to cover up the hernia defect using a laparoscopic suture passer to pass the tails of the Prolene through the abdominal wall & tagged them with clamps for good transfascial suturing.  I started out in four corners to make sure I had the mesh centered under the hernia defect appropriately, and then proceeded to work in quadrants.    We evacuated CO2 & desufflated the abdomen.  I tied the fascial stitches down. I closed the fascial defect that I placed the mesh through using #1 PDS interrupted transverse stitches primarily.  I reinsufflated the abdomen. The mesh provided at least circumferential coverage around the entire region of hernia defects.  I secured the mesh centrally with an additional trans fascial stitch in & out the mesh using #1 PDS under laparoscopic visualization.   I tacked the edges & central part of the mesh to the peritoneum/posterior rectus fascia with SecureStrap absorbable tacks.   I did reinspection. Hemostasis was good. Mesh laid well. I completed a broad  field block of local anesthesia at fascial stitch sites & fascial closure areas.    Capnoperitoneum was evacuated. Ports were removed. The skin was closed with Monocryl at the port sites and Steri-Strips on the fascial stitch puncture sites.  Patient is being extubated to go to the recovery room.  I discussed operative findings, updated the patient's status, discussed probable steps to recovery, and gave postoperative recommendations to the patient's spouse.  Recommendations were made.  Questions were answered.  She expressed understanding & appreciation.  Adin Hector, M.D., F.A.C.S. Gastrointestinal and Minimally Invasive Surgery [facility] [address], [place], [place] [address] [phone] Main / Paging  06/03/2018 1:30 PM

## 2018-06-03 NOTE — Anesthesia Preprocedure Evaluation (Addendum)
Anesthesia Evaluation  Patient identified by MRN, date of birth, ID band Patient awake    Reviewed: Allergy & Precautions, NPO status , Patient's Chart, lab work & pertinent test results  Airway Mallampati: III  TM Distance: >3 FB Neck ROM: Full    Dental no notable dental hx. (+) Teeth Intact, Dental Advisory Given   Pulmonary neg pulmonary ROS,    Pulmonary exam normal breath sounds clear to auscultation       Cardiovascular hypertension, negative cardio ROS Normal cardiovascular exam Rhythm:Regular Rate:Normal     Neuro/Psych PSYCHIATRIC DISORDERS Anxiety negative neurological ROS     GI/Hepatic negative GI ROS, Neg liver ROS, Fatty liver H/o colon cancer   Endo/Other  negative endocrine ROS  Renal/GU negative Renal ROS  negative genitourinary   Musculoskeletal  (+) Arthritis ,   Abdominal   Peds  Hematology  (+) Blood dyscrasia, anemia ,   Anesthesia Other Findings   Reproductive/Obstetrics                            Anesthesia Physical Anesthesia Plan  ASA: II  Anesthesia Plan: General   Post-op Pain Management:    Induction: Intravenous  PONV Risk Score and Plan: 2 and Dexamethasone, Ondansetron and Treatment may vary due to age or medical condition  Airway Management Planned: Oral ETT  Additional Equipment:   Intra-op Plan:   Post-operative Plan: Extubation in OR  Informed Consent: I have reviewed the patients History and Physical, chart, labs and discussed the procedure including the risks, benefits and alternatives for the proposed anesthesia with the patient or authorized representative who has indicated his/her understanding and acceptance.   Dental advisory given  Plan Discussed with: CRNA  Anesthesia Plan Comments:         Anesthesia Quick Evaluation

## 2018-06-03 NOTE — Anesthesia Procedure Notes (Signed)
Procedure Name: Intubation Date/Time: 06/03/2018 12:01 PM Performed by: Victoriano Lain, CRNA Pre-anesthesia Checklist: Patient identified, Emergency Drugs available, Patient being monitored, Suction available and Timeout performed Patient Re-evaluated:Patient Re-evaluated prior to induction Oxygen Delivery Method: Circle system utilized Preoxygenation: Pre-oxygenation with 100% oxygen Induction Type: IV induction Ventilation: Mask ventilation without difficulty Laryngoscope Size: Mac and 4 Grade View: Grade I Tube type: Oral Tube size: 7.5 mm Number of attempts: 1 Airway Equipment and Method: Stylet Placement Confirmation: ETT inserted through vocal cords under direct vision,  positive ETCO2 and breath sounds checked- equal and bilateral Secured at: 22 cm Tube secured with: Tape Dental Injury: Teeth and Oropharynx as per pre-operative assessment  Comments: DL X 1 by CRNA. Grade 2 view. DL X1 by Dr. Glennon Mac. Grade 1 view. ATOI. BBS=.+ ETCO2.

## 2018-06-04 ENCOUNTER — Encounter (HOSPITAL_COMMUNITY): Payer: Self-pay | Admitting: Surgery

## 2018-06-04 NOTE — Anesthesia Postprocedure Evaluation (Signed)
Anesthesia Post Note  Patient: [name]  Procedure(s) Performed: LAPAROSCOPIC VENTRAL WALL  HERNIA  REPAIR WITH MESH  ERAS PATHWAY (N/A )     Patient location during evaluation: PACU Anesthesia Type: General Level of consciousness: awake and alert Pain management: pain level controlled Vital Signs Assessment: post-procedure vital signs reviewed and stable Respiratory status: spontaneous breathing, nonlabored ventilation, respiratory function stable and patient connected to nasal cannula oxygen Cardiovascular status: blood pressure returned to baseline and stable Postop Assessment: no apparent nausea or vomiting Anesthetic complications: no    Last Vitals:  Vitals:   06/03/18 1510 06/03/18 1536  BP: 128/83 103/83  Pulse: 79 76  Resp: 14 14  Temp:    SpO2: 95% 96%    Last Pain:  Vitals:   06/03/18 1536  TempSrc:   PainSc: 4                  Chelsey L Woodrum

## 2018-07-21 DIAGNOSIS — H2513 Age-related nuclear cataract, bilateral: Secondary | ICD-10-CM | POA: Diagnosis not present

## 2018-07-27 MED FILL — SHINGRIX 50 MCG SUS: 50 | 1 days supply | Qty: 1 | Fill #1

## 2018-09-26 IMAGING — CT CT ABD-PELV W/O CM
2 of 4 series · 16 of 46 positions shown, 18 images · non-contrast
Comparison: July 10, 2015

CLINICAL DATA: Left lower abdomen pain and constipation for weeks.

EXAM:
CT ABDOMEN AND PELVIS WITHOUT CONTRAST
TECHNIQUE: Multidetector CT imaging of the abdomen and pelvis was performed
following the standard protocol without IV contrast.

[Series 2: axial st · axial · 0.98mm/px · z∈[-526,-36]mm · 13 of 108 slices shown, 15 images]
[im 5/108  soft-tissue]
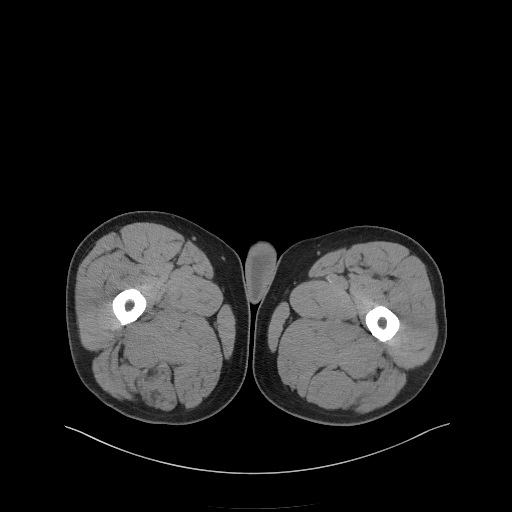
[im 5/108  bone]
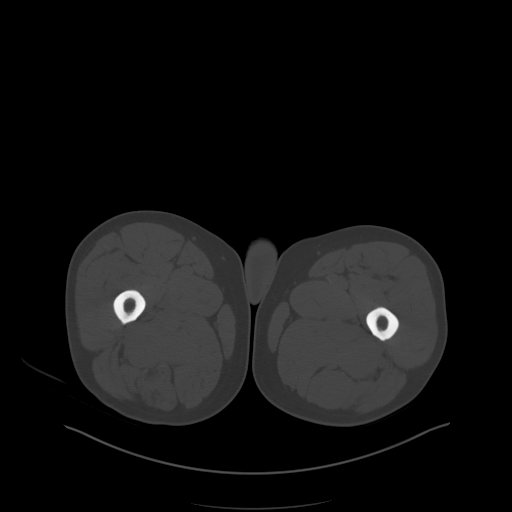
[im 13/108  soft-tissue]
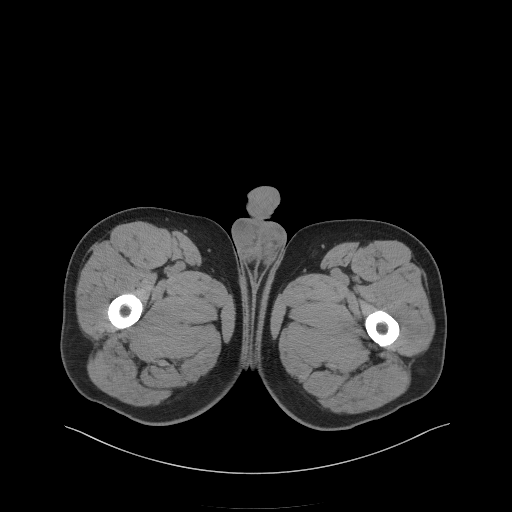
[im 22/108  soft-tissue]
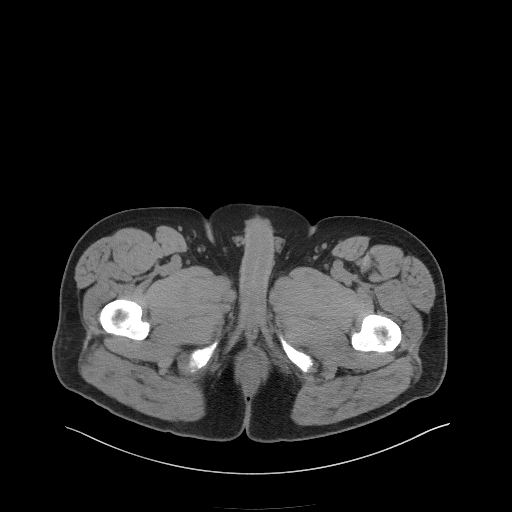
[im 30/108  soft-tissue]
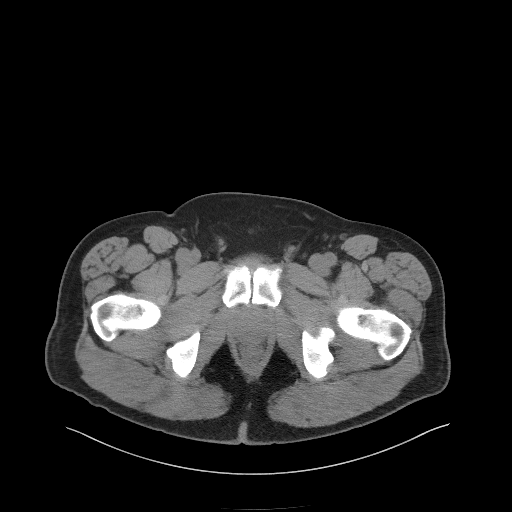
[im 39/108  soft-tissue]
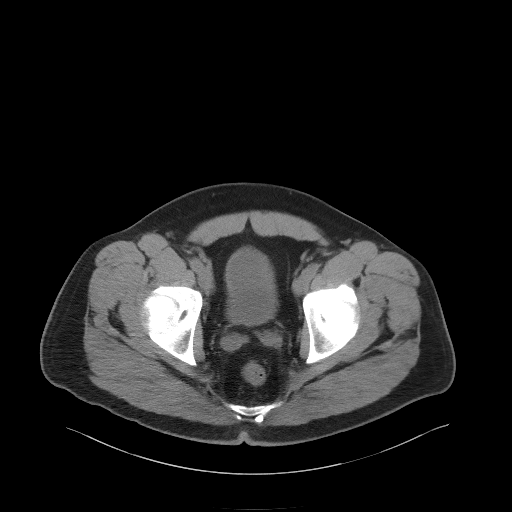
[im 48/108  soft-tissue]
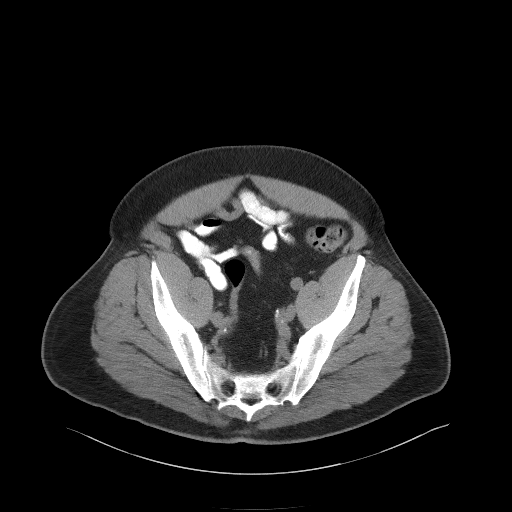
[im 56/108  soft-tissue]
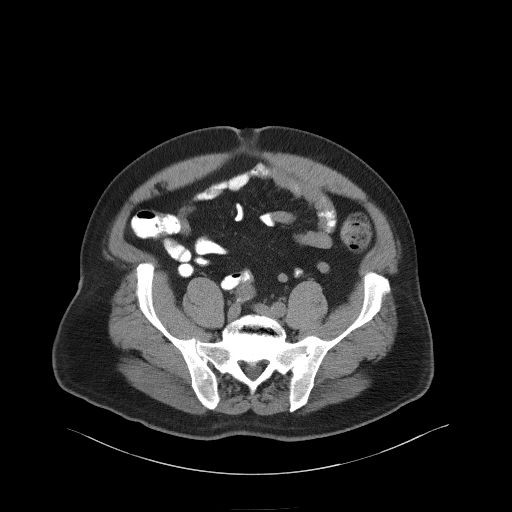
[im 60/108  soft-tissue]
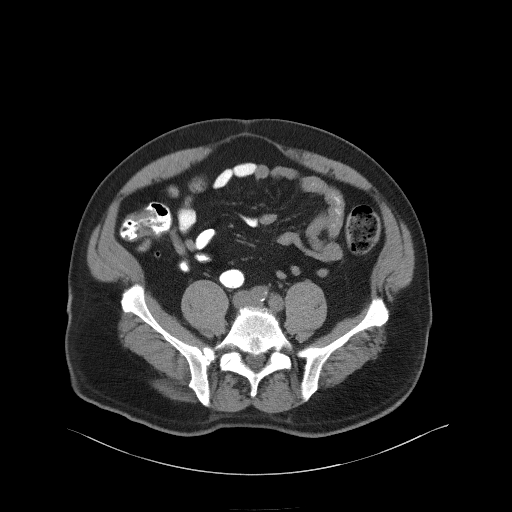
[im 69/108  soft-tissue]
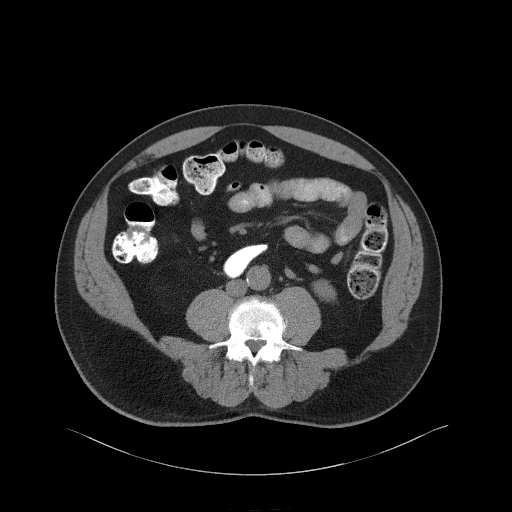
[im 69/108  bone]
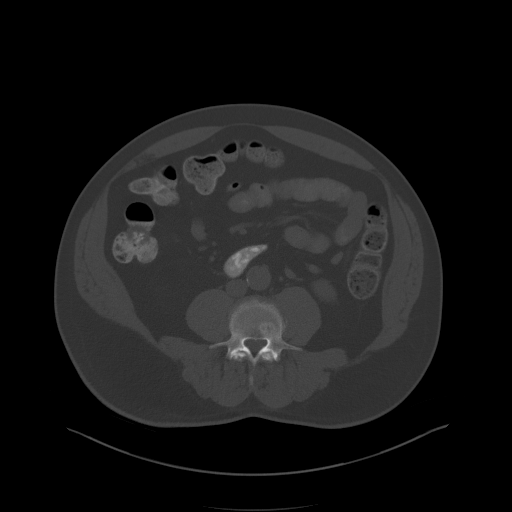
[im 78/108  soft-tissue]
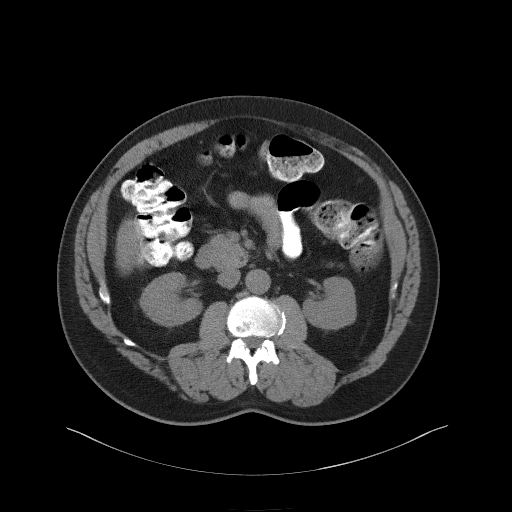
[im 86/108  soft-tissue]
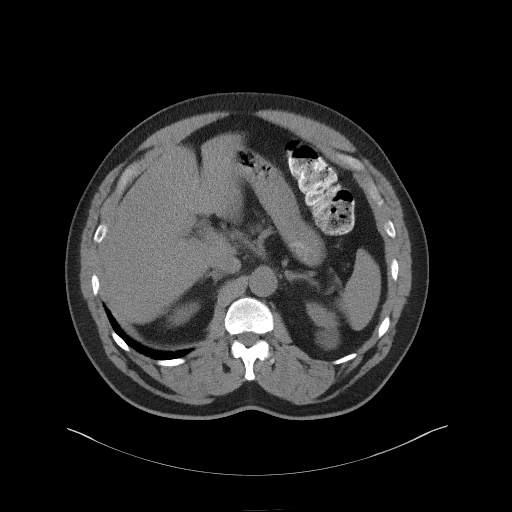
[im 95/108  soft-tissue]
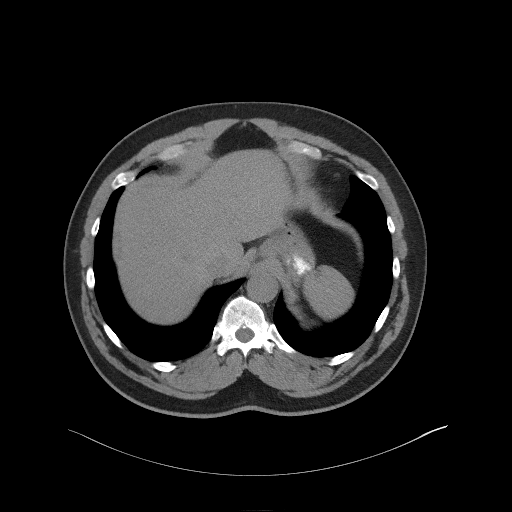
[im 103/108  soft-tissue]
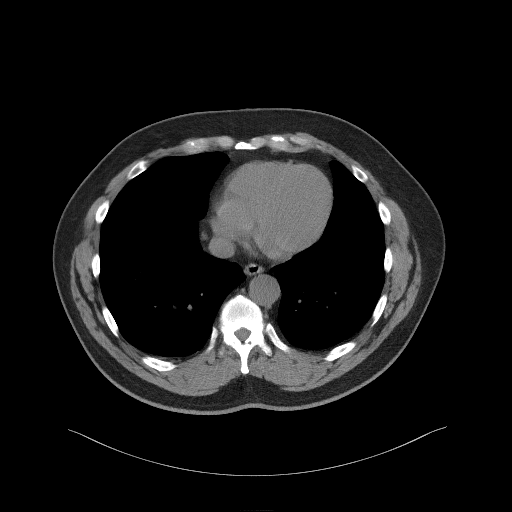

[Series 5: coronal st · coronal · 0.79mm/px · 3 of 115 slices shown]
[im 39/115  soft-tissue]
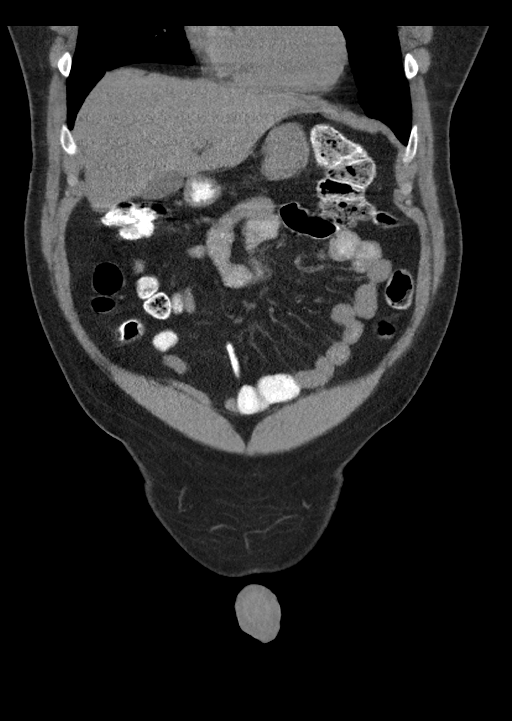
[im 51/115  soft-tissue]
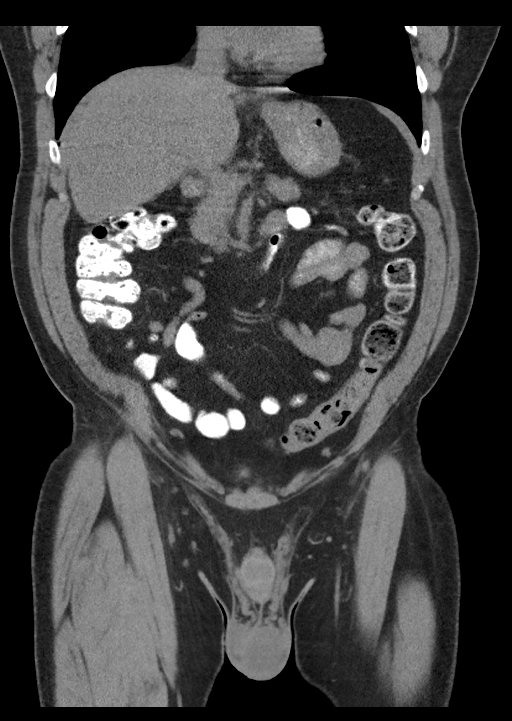
[im 64/115  soft-tissue]
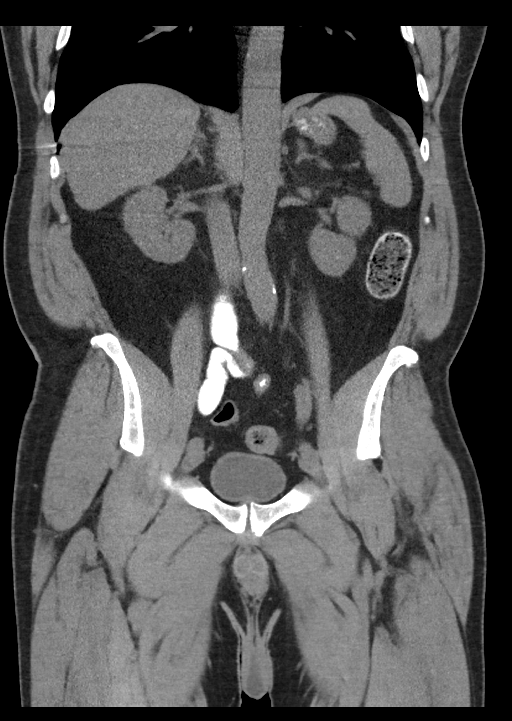

[16 of 46 positions shown; findings below may reference images not displayed]

FINDINGS: Lower chest: No acute abnormality.

Hepatobiliary: No focal liver abnormality is seen. No gallstones,
gallbladder wall thickening, or biliary dilatation.

Pancreas: Unremarkable. No pancreatic ductal dilatation or
surrounding inflammatory changes.

Spleen: Normal in size without focal abnormality.

Adrenals/Urinary Tract: The bilateral adrenal glands are normal.
Bilateral kidney cysts are noted. There is no hydronephrosis
bilaterally. No nephrolithiasis is noted bilaterally. The bladder is
normal.

Stomach/Bowel: The stomach is normal. There are thick wall small
bowel loops in the proximal jejunum. There is no bowel obstruction.
The colon is normal. The appendix is normal.

Vascular/Lymphatic: Aortic atherosclerosis. No enlarged abdominal or
pelvic lymph nodes.

Reproductive: Prostate calcifications are noted.

Other: Umbilical herniation of mesenteric fat is identified.

Musculoskeletal: Degenerative joint changes of the spine are
identified.
IMPRESSION: Thick wall small bowel loops in the proximal jejunum without
evidence of bowel obstruction. Findings can be due to
infectious/inflammatory etiology such as inflammatory bowel disease.

Bilateral kidney cysts.

## 2018-11-18 ENCOUNTER — Encounter: Payer: Self-pay | Admitting: Family Medicine

## 2018-11-18 DIAGNOSIS — Z125 Encounter for screening for malignant neoplasm of prostate: Secondary | ICD-10-CM | POA: Diagnosis not present

## 2018-11-18 DIAGNOSIS — N528 Other male erectile dysfunction: Secondary | ICD-10-CM | POA: Diagnosis not present

## 2018-11-18 MED FILL — SILDENAFIL CITRATE 20 MG TA: 20 | 7 days supply | Qty: 20 | Fill #0

## 2018-12-09 DIAGNOSIS — Z20828 Contact with and (suspected) exposure to other viral communicable diseases: Secondary | ICD-10-CM | POA: Diagnosis not present

## 2018-12-09 DIAGNOSIS — Z7189 Other specified counseling: Secondary | ICD-10-CM | POA: Diagnosis not present

## 2019-03-14 ENCOUNTER — Other Ambulatory Visit: Payer: Self-pay | Admitting: Family Medicine

## 2019-03-14 MED FILL — AZELASTINE HCL 0.05 % SOLN: 0.05 | 60 days supply | Qty: 6 | Fill #0

## 2019-03-14 MED FILL — FLUAD QUADRIVALENT 0.5 ML P: 0.5 | 1 days supply | Qty: 1 | Fill #0

## 2019-03-16 ENCOUNTER — Other Ambulatory Visit: Payer: Self-pay

## 2019-03-16 ENCOUNTER — Ambulatory Visit (INDEPENDENT_AMBULATORY_CARE_PROVIDER_SITE_OTHER): Payer: Medicare Other | Admitting: Family Medicine

## 2019-03-16 ENCOUNTER — Encounter: Payer: Self-pay | Admitting: Family Medicine

## 2019-03-16 VITALS — Ht 70.0 in | Wt 212.0 lb

## 2019-03-16 DIAGNOSIS — M1711 Unilateral primary osteoarthritis, right knee: Secondary | ICD-10-CM

## 2019-03-16 DIAGNOSIS — M179 Osteoarthritis of knee, unspecified: Secondary | ICD-10-CM | POA: Insufficient documentation

## 2019-03-16 NOTE — Assessment & Plan Note (Signed)
Pain is likely associated with degenerative changes.  X-ray from 2017 shows medial joint space changes. -Counseled on home exercise therapy and supportive care. -Can consider a medial unloader brace due to thigh to calf ratio. -If no improvement can consider injection and updated imaging.

## 2019-03-16 NOTE — Patient Instructions (Signed)
Nice to meet you Please try ice as needed  Please use the ibuprofen as needed  Please let me know if you want to try the custom brace  Please send me a message in MyChart with any questions or updates.  Please see [place] back as needed.   --Dr. Raeford Razor

## 2019-03-16 NOTE — Progress Notes (Signed)
[name] - 73 y.o. male MRN [id number]  Date of birth: 01-19-1946  SUBJECTIVE:  Including CC & ROS.  Chief Complaint  Patient presents with  . Knee Pain    right knee    [name] is a 73 y.o. male that is presenting with acute right knee pain.  Over the weekend his knee was severely swollen and in pain.  He had limitations with flexion.  Seem to be localized to the knee.  He denies any specific inciting event.  He started taken ibuprofen and that improved his symptoms.  He has a history of meniscectomy in the left and right knee about 20 years ago.  He denies any mechanical symptoms.  His pain currently is mild.  He has regained his range of motion..  Independent review of the right knee x-ray from 2017 shows moderate medial joint space narrowing a small effusion.   Review of Systems  Constitutional: Negative for fever.  HENT: Negative for congestion.   Respiratory: Negative for cough.   Cardiovascular: Negative for chest pain.  Gastrointestinal: Negative for abdominal pain.  Musculoskeletal: Positive for arthralgias.  Skin: Negative for color change.  Neurological: Negative for weakness.  Hematological: Negative for adenopathy.    HISTORY: Past Medical, Surgical, Social, and Family History Reviewed & Updated per EMR.   Pertinent Historical Findings include:  Past Medical History:  Diagnosis Date  . Abnormal CT scan, gastrointestinal tract suspect angioedema   . Anemia 03/16/2016   IRON  . Anxiety   . Arthritis   . Back pain 10/10/2012  . Fatty liver 2005   seen on 2005 ultrasound,05/2015 CT.   Marland Kitchen Hemorrhoids 08/22/2016  . Hemorrhoids, internal, with bleeding 2005   internal and external. 2005 band ligation (dr Ferdinand Lango in [facility])  . Hyperglycemia 08/21/2013  . Hyperlipidemia   . Hypertension   . Nocturia 08/21/2013  . Obesity (BMI 30.0-34.9)   . Osteoporosis   . Pneumonia    as a child  . Rectal bleeding 07/30/2014  . Renal cyst   . SBO (small bowel  obstruction) ([place])   . 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 Unspecified constipation 02/06/2014  . Vitamin D deficiency     Past Surgical History:  Procedure Laterality Date  . COLONOSCOPY  before 2005, 2012, 2013, 08/2014.   Marland Kitchen ENTEROSCOPY N/A 10/05/2017   Procedure: ENTEROSCOPY;  Surgeon: Gatha Mayer, MD;  Location: [facility] END[facility]COPY;  Service: Endoscopy;  Laterality: N/A;  . FRACTURE SURGERY Left 1989   leg  . HEMORRHOID BANDING  2005  . KNEE SURGERY Left 2010   arthroscopy, torn carilage  . KNEE SURGERY Right 2012   arthroscopy  . TONSILLECTOMY  1957  . VENTRAL HERNIA REPAIR N/A 06/03/2018   Procedure: LAPAROSCOPIC VENTRAL WALL  HERNIA  REPAIR WITH MESH  ERAS PATHWAY;  Surgeon: Michael Boston, MD;  Location: [facility] ORS;  Service: General;  Laterality: N/A;    No Known Allergies  Family History  Problem Relation Age of Onset  . Stroke Mother   . Aneurysm Mother        brain  . Arthritis Father   . Liver cancer Father        liver, atomic testing in [employer]  . Gallbladder disease Father   . Hyperlipidemia Sister   . Hypertension Sister   . Obesity Sister   . Diabetes Sister   . Diabetes Brother   . Hyperlipidemia Sister   . Hypertension Sister   . Obesity Sister   .  Diabetes Sister   . Aneurysm Brother        brain  . COPD Brother   . Gallbladder disease Sister        x4  . Colon cancer Neg Hx   . Colon polyps Neg Hx   . Esophageal cancer Neg Hx   . Rectal cancer Neg Hx   . Stomach cancer Neg Hx      Social History   Socioeconomic History  . Marital status: Married    Spouse name: Not on file  . Number of children: 1  . Years of education: Not on file  . Highest education level: Not on file  Occupational History  . Occupation: Retired    Fish farm manager: [employer]  . Financial resource strain: Not on file  . Food insecurity    Worry: Not on file    Inability: Not on file  . Transportation needs    Medical: Not on file     Non-medical: Not on file  Tobacco Use  . Smoking status: Never Smoker  . Smokeless tobacco: Never Used  Substance and Sexual Activity  . Alcohol use: Yes    Alcohol/week: 2.0 standard drinks    Types: 2 Standard drinks or equivalent per week    Comment: Occassionally  . Drug use: No  . Sexual activity: Yes  Lifestyle  . Physical activity    Days per week: Not on file    Minutes per session: Not on file  . Stress: Not on file  Relationships  . Social Herbalist on phone: Not on file    Gets together: Not on file    Attends religious service: Not on file    Active member of club or organization: Not on file    Attends meetings of clubs or organizations: Not on file    Relationship status: Not on file  . Intimate partner violence    Fear of current or ex partner: Not on file    Emotionally abused: Not on file    Physically abused: Not on file    Forced sexual activity: Not on file  Other Topics Concern  . Not on file  Social History Narrative   Married - 1 child   Retired from [employer], no tbacco/drug use     PHYSICAL EXAM:  VS: Ht 5\' 10"  (1.778 m)   Wt 212 lb (96.2 kg)   BMI 30.42 kg/m  Physical Exam Gen: NAD, alert, cooperative with exam, well-appearing ENT: normal lips, normal nasal mucosa,  Eye: normal EOM, normal conjunctiva and lids CV:  no edema, +2 pedal pulses   Resp: no accessory muscle use, non-labored,  Skin: no rashes, no areas of induration  Neuro: normal tone, normal sensation to touch Psych:  normal insight, alert and oriented MSK:  Right knee: No obvious effusion. No tenderness palpation of the medial lateral joint line. Instability with valgus and varus stress testing. Negative McMurray's test. No pain with patellar grind. Neurovascular intact     ASSESSMENT & PLAN:   Primary osteoarthritis of right knee Pain is likely associated with degenerative changes.  X-ray from 2017 shows medial joint space changes.  -Counseled on home exercise therapy and supportive care. -Can consider a medial unloader brace due to thigh to calf ratio. -If no improvement can consider injection and updated imaging.

## 2019-04-25 NOTE — Progress Notes (Signed)
Virtual Visit via Video Note  I connected with patient on 04/26/19 at  1:00 PM EDT by audio enabled telemedicine application and verified that I am speaking with the correct person using two identifiers.   THIS ENCOUNTER IS A VIRTUAL VISIT DUE TO COVID-19 - PATIENT WAS NOT SEEN IN THE OFFICE. PATIENT HAS CONSENTED TO VIRTUAL VISIT / TELEMEDICINE VISIT   Location of patient: home  Location of provider: office  I discussed the limitations of evaluation and management by telemedicine and the availability of in person appointments. The patient expressed understanding and agreed to proceed.   Subjective:   [name] is a 73 y.o. male who presents for Medicare Annual/Subsequent preventive examination.  Review of Systems:  Home Safety/Smoke Alarms: Feels safe in home. Smoke alarms in place.  Lives w/ wife in 2 story home. Does well with stairs.   Male:   CCS-  Next due 08/2019   PSA-  Lab Results  Component Value Date   PSA 1.08 08/07/2014   PSA 0.80 11/23/2012       Objective:    Vitals: Unable to assess. This visit is enabled though telemedicine due to Covid 19.   Advanced Directives 04/26/2019 05/24/2018 04/20/2018 10/05/2017 09/22/2017 01/23/2017 08/30/2016  Does Patient Have a Medical Advance Directive? No Yes Yes Yes Yes No Yes  Type of Advance Directive - [place];Living will [place];Living will [place];Living will Living will;Healthcare Power of [place];Living will  Does patient want to make changes to medical advance directive? - No - Patient declined - - No - Patient declined - -  Copy of [place] in Chart? - No - copy requested No - copy requested No - copy requested No - copy requested - No - copy requested  Would patient like information on creating a medical advance directive? 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    Hemorrhoids 08/22/2016   Hemorrhoids, internal, with bleeding 2005   internal and external. 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    ENTEROSCOPY N/A 10/05/2017   Procedure: ENTEROSCOPY;  Surgeon: Gatha Mayer, MD;  Location: [facility];  Service: Endoscopy;  Laterality: N/A;   FRACTURE SURGERY Left 1989   leg   HEMORRHOID BANDING  2005   KNEE SURGERY Left 2010   arthroscopy, torn carilage   KNEE SURGERY Right 2012  arthroscopy   TONSILLECTOMY  1957   VENTRAL HERNIA REPAIR N/A 06/03/2018   Procedure: LAPAROSCOPIC VENTRAL WALL  HERNIA  REPAIR WITH MESH  ERAS PATHWAY;  Surgeon: Michael Boston, MD;  Location: [facility];  Service: General;  Laterality: N/A;   Family History  Problem Relation Age of Onset   Stroke Mother    Aneurysm Mother        brain   Arthritis Father    Liver cancer Father        liver, atomic [place] in [place]   Gallbladder disease Father    Hyperlipidemia Sister    Hypertension Sister    Obesity Sister    Diabetes Sister    Diabetes Brother    Hyperlipidemia Sister    Hypertension  Sister    Obesity Sister    Diabetes Sister    Aneurysm Brother        brain   COPD Brother    Gallbladder disease Sister        x4   Colon cancer Neg Hx    Colon polyps Neg Hx    Esophageal cancer Neg Hx    Rectal cancer Neg Hx    Stomach cancer Neg Hx    Social History   Socioeconomic History   Marital status: Married    Spouse name: Not on file   Number of children: 1   Years of education: Not on file   Highest education level: Not on file  Occupational History   Occupation: Retired    Fish farm manager: [place] resource strain: Not on file   Food insecurity    Worry: Not on file    Inability: Not on file   Transportation needs    Medical: Not on file    Non-medical: Not on file  Tobacco Use   Smoking status: Never Smoker   Smokeless tobacco: Never Used  Substance and Sexual Activity   Alcohol use: Yes    Alcohol/week: 2.0 standard drinks    Types: 2 Standard drinks or equivalent per week    Comment: Occassionally   Drug use: No   Sexual activity: Yes  Lifestyle   Physical activity    Days per week: Not on file    Minutes per session: Not on file   Stress: Not on file  Relationships   Social connections    Talks on phone: Not on file    Gets together: Not on file    Attends religious service: Not on file    Active member of club or organization: Not on file    Attends meetings of clubs or organizations: Not on file    Relationship status: Not on file  Other Topics Concern   Not on file  Social History Narrative   Married - 1 child   Retired from [employer]   Some EtOH, no tbacco/drug use    Outpatient Encounter Medications as of 04/26/2019  Medication Sig   ALPRAZolam (XANAX) 1 MG tablet Take 1 mg by mouth at bedtime as needed for anxiety.   amLODipine (NORVASC) 5 MG tablet TAKE 1 TABLET DAILY   azelastine (OPTIVAR) 0.05 % ophthalmic solution Place 1 drop into the left eye 2 (two) times  daily. NEEDS OV BEFORE ANY MORE REFILLS   fluticasone (FLONASE) 50 MCG/ACT nasal spray Place 2 sprays into both nostrils daily as needed for allergies or rhinitis.   hydrocortisone (HEMMOREX-HC) 25 MG suppository Place 1 suppository (25 mg total) rectally 2 (two) times  daily as needed for hemorrhoids or anal itching.   ibuprofen (ADVIL,MOTRIN) 200 MG tablet Take 400 mg by mouth every 6 (six) hours as needed for moderate pain.   losartan (COZAAR) 50 MG tablet TAKE 1 TABLET DAILY (Patient taking differently: Take 50 mg by mouth daily. )   Multiple Vitamins-Minerals (MULTIVITAMIN ADULT PO) Take 1 tablet by mouth daily.   Pitavastatin Calcium (LIVALO) 2 MG TABS TAKE 1 TABLET EVERY DAY (Patient taking differently: Take 2 mg by mouth daily. TAKE 1 TABLET EVERY DAY)   polyethylene glycol (MIRALAX / GLYCOLAX) packet Take 17 g by mouth every Monday, Wednesday, and Friday.    TOPROL XL 50 MG 24 hr tablet TAKE 1 TABLET DAILY . TAKE WITH OR IMMEDIATELY FOLLOWING A MEAL (Patient taking differently: Take 50 mg by mouth daily. )   traMADol (ULTRAM) 50 MG tablet Take 1-2 tablets (50-100 mg total) by mouth every 6 (six) hours as needed for moderate pain or severe pain.   No facility-administered encounter medications on file as of 04/26/2019.     Activities of Daily Living In your present state of health, do you have any difficulty performing the following activities: 04/26/2019 05/24/2018  Hearing? N N  Vision? N N  Difficulty concentrating or making decisions? N N  Walking or climbing stairs? N N  Dressing or bathing? N N  Doing errands, shopping? N N  Preparing Food and eating ? N -  Using the Toilet? N -  In the past six months, have you accidently leaked urine? N -  Do you have problems with loss of bowel control? N -  Managing your Medications? N -  Managing your Finances? N -  Housekeeping or managing your Housekeeping? N -  Some recent data might be hidden    Patient Care  Team: Mosie Lukes, MD as PCP - General (Family Medicine) Michael Boston, MD as Consulting Physician (General Surgery) Melina Schools, MD as Consulting Physician (Orthopedic Surgery) Gatha Mayer, MD as Consulting Physician (Gastroenterology)   Assessment:   This is a routine wellness examination for [place]. Physical assessment deferred to PCP.  Exercise Activities and Dietary recommendations Current Exercise Habits: Home exercise routine, Type of exercise: treadmill;strength training/weights, Time (Minutes): 60, Frequency (Times/Week): 3, Weekly Exercise (Minutes/Week): 180, Intensity: Moderate, Exercise limited by: None identified   Diet (meal preparation, eat out, water intake, caffeinated beverages, dairy products, fruits and vegetables): well balanced      Goals     Get back in the gym 3x/week       Fall Risk Fall Risk  04/26/2019 04/20/2018 01/29/2018 08/22/2016 07/05/2015  Falls in the past year? 0 No No No No  Number falls in past yr: 0 - - - -  Injury with Fall? 0 - - - -    Depression Screen PHQ 2/9 Scores 04/26/2019 04/20/2018 01/29/2018 08/22/2016  PHQ - 2 Score 0 0 0 0    Cognitive Function Ad8 score reviewed for issues:  Issues making decisions:  Less interest in hobbies / activities:no  Repeats questions, stories (family complaining):no  Trouble using ordinary gadgets (microwave, computer, phone):no  Forgets the month or year: no  Mismanaging finances: no  Remembering appts:no  Daily problems with thinking and/or memory:no Ad8 score is=0   MMSE - Mini Mental State Exam 08/22/2016  Orientation to time 5  Orientation to Place 5  Registration 3  Attention/ Calculation 5  Recall 3  Language- name 2 objects 2  Language- repeat 1  Language- follow  3 step command 3  Language- read & follow direction 1  Write a sentence 1  Copy design 1  Total score 30        Immunization History  Administered Date(s) Administered   Fluad Quad(high Dose  65+) 03/15/2019   Influenza Whole 04/12/2013   Influenza, High Dose Seasonal PF 03/11/2016, 04/07/2017, 04/20/2018   Influenza,inj,Quad PF,6+ Mos 04/12/2015   Influenza-Unspecified 04/13/2014   Pneumococcal Conjugate-13 08/18/2013   Pneumococcal Polysaccharide-23 02/19/2016   Tdap 06/30/2010   Zoster 06/30/2010   Zoster Recombinat (Shingrix) 04/20/2018, 07/30/2018    Screening Tests Health Maintenance  Topic Date Due   COLONOSCOPY  08/26/2019   TETANUS/TDAP  06/30/2020   INFLUENZA VACCINE  Completed   Hepatitis C Screening  Completed   PNA vac Low Risk Adult  Completed    Plan:   See you next year!  Continue to eat heart healthy diet (full of fruits, vegetables, whole grains, lean protein, water--limit salt, fat, and sugar intake) and increase physical activity as tolerated.  Continue doing brain stimulating activities (puzzles, reading, adult coloring books, staying active) to keep memory sharp.   Bring a copy of your living will and/or healthcare power of attorney to your next office visit.   I have personally reviewed and noted the following in the patients chart:    Medical and social history  Use of alcohol, tobacco or illicit drugs   Current medications and supplements  Functional ability and status  Nutritional status  Physical activity  Advanced directives  List of other physicians  Hospitalizations, surgeries, and ER visits in previous 12 months  Vitals  Screenings to include cognitive, depression, and falls  Referrals and appointments  In addition, I have reviewed and discussed with patient certain preventive protocols, quality metrics, and best practice recommendations. A written personalized care plan for preventive services as well as general preventive health recommendations were provided to patient.     Shela Nevin, [place]  04/26/2019

## 2019-04-26 ENCOUNTER — Encounter: Payer: Self-pay | Admitting: *Deleted

## 2019-04-26 ENCOUNTER — Ambulatory Visit (INDEPENDENT_AMBULATORY_CARE_PROVIDER_SITE_OTHER): Payer: Medicare Other | Admitting: *Deleted

## 2019-04-26 DIAGNOSIS — Z0000 Encounter for general adult medical examination without abnormal findings: Secondary | ICD-10-CM

## 2019-04-26 NOTE — Patient Instructions (Signed)
See you next year!  Continue to eat heart healthy diet (full of fruits, vegetables, whole grains, lean protein, water--limit salt, fat, and sugar intake) and increase physical activity as tolerated.  Continue doing brain stimulating activities (puzzles, reading, adult coloring books, staying active) to keep memory sharp.   Bring a copy of your living will and/or healthcare power of attorney to your next office visit.   [name] , Thank you for taking time to come for your Medicare Wellness Visit. I appreciate your ongoing commitment to your health goals. Please review the following plan we discussed and let me know if I can assist you in the future.   These are the goals we discussed: Goals    . Get back in the gym 3x/week       This is a list of the screening recommended for you and due dates:  Health Maintenance  Topic Date Due  . Colon Cancer Screening  08/26/2019  . Tetanus Vaccine  06/30/2020  . Flu Shot  Completed  .  Hepatitis C: One time screening is recommended by Center for Disease Control  (CDC) for  adults born from [birth year] through 1963.   Completed  . Pneumonia vaccines  Completed    Preventive Care 62 Years and Older, Male Preventive care refers to lifestyle choices and visits with your health care provider that can promote health and wellness. This includes:  A yearly physical exam. This is also called an annual well check.  Regular dental and eye exams.  Immunizations.  Screening for certain conditions.  Healthy lifestyle choices, such as diet and exercise. What can I expect for my preventive care visit? Physical exam Your health care provider will check:  Height and weight. These may be used to calculate body mass index (BMI), which is a measurement that tells if you are at a healthy weight.  Heart rate and blood pressure.  Your skin for abnormal spots. Counseling Your health care provider may ask you questions about:  Alcohol, tobacco, and drug use.   Emotional well-being.  Home and relationship well-being.  Sexual activity.  Eating habits.  History of falls.  Memory and ability to understand (cognition).  Work and work Statistician. What immunizations do I need?  Influenza (flu) vaccine  This is recommended every year. Tetanus, diphtheria, and pertussis (Tdap) vaccine  You may need a Td booster every 10 years. Varicella (chickenpox) vaccine  You may need this vaccine if you have not already been vaccinated. Zoster (shingles) vaccine  You may need this after age 71. Pneumococcal conjugate (PCV13) vaccine  One dose is recommended after age 71. Pneumococcal polysaccharide (PPSV23) vaccine  One dose is recommended after age 3. Measles, mumps, and rubella (MMR) vaccine  You may need at least one dose of MMR if you were born in 1957 or later. You may also need a second dose. Meningococcal conjugate (MenACWY) vaccine  You may need this if you have certain conditions. Hepatitis A vaccine  You may need this if you have certain conditions or if you travel or work in places where you may be exposed to hepatitis A. Hepatitis B vaccine  You may need this if you have certain conditions or if you travel or work in places where you may be exposed to hepatitis B. Haemophilus influenzae type b (Hib) vaccine  You may need this if you have certain conditions. You may receive vaccines as individual doses or as more than one vaccine together in one shot (combination vaccines). Talk  with your health care provider about the risks and benefits of combination vaccines. What tests do I need? Blood tests  Lipid and cholesterol levels. These may be checked every 5 years, or more frequently depending on your overall health.  Hepatitis C test.  Hepatitis B test. Screening  Lung cancer screening. You may have this screening every year starting at age 71 if you have a 30-pack-year history of smoking and currently smoke or have quit  within the past 15 years.  Colorectal cancer screening. All adults should have this screening starting at age 71 and continuing until age 49. Your health care provider may recommend screening at age 41 if you are at increased risk. You will have tests every 1-10 years, depending on your results and the type of screening test.  Prostate cancer screening. Recommendations will vary depending on your family history and other risks.  Diabetes screening. This is done by checking your blood sugar (glucose) after you have not eaten for a while (fasting). You may have this done every 1-3 years.  Abdominal aortic aneurysm (AAA) screening. You may need this if you are a current or former smoker.  Sexually transmitted disease (STD) testing. Follow these instructions at home: Eating and drinking  Eat a diet that includes fresh fruits and vegetables, whole grains, lean protein, and low-fat dairy products. Limit your intake of foods with high amounts of sugar, saturated fats, and salt.  Take vitamin and mineral supplements as recommended by your health care provider.  Do not drink alcohol if your health care provider tells you not to drink.  If you drink alcohol: ? Limit how much you have to 0-2 drinks a day. ? Be aware of how much alcohol is in your drink. In the [place] one drink equals one 12 oz bottle of beer (355 mL), one 5 oz glass of wine (148 mL), or one 1 oz glass of hard liquor (44 mL). Lifestyle  Take daily care of your teeth and gums.  Stay active. Exercise for at least 30 minutes on 5 or more days each week.  Do not use any products that contain nicotine or tobacco, such as cigarettes, e-cigarettes, and chewing tobacco. If you need help quitting, ask your health care provider.  If you are sexually active, practice safe sex. Use a condom or other form of protection to prevent STIs (sexually transmitted infections).  Talk with your health care provider about taking a low-dose aspirin or  statin. What's next?  Visit your health care provider once a year for a well check visit.  Ask your health care provider how often you should have your eyes and teeth checked.  Stay up to date on all vaccines. This information is not intended to replace advice given to you by your health care provider. Make sure you discuss any questions you have with your health care provider. Document Released: 07/13/2015 Document Revised: 06/10/2018 Document Reviewed: 06/10/2018 Elsevier Patient Education  2020 [employer].

## 2019-07-01 ENCOUNTER — Other Ambulatory Visit: Payer: Self-pay | Admitting: Family Medicine

## 2019-07-06 ENCOUNTER — Other Ambulatory Visit: Payer: Self-pay | Admitting: Family Medicine

## 2019-07-06 ENCOUNTER — Other Ambulatory Visit: Payer: Self-pay

## 2019-07-07 ENCOUNTER — Other Ambulatory Visit: Payer: Self-pay

## 2019-07-07 ENCOUNTER — Ambulatory Visit (INDEPENDENT_AMBULATORY_CARE_PROVIDER_SITE_OTHER): Payer: Medicare Other | Admitting: Family Medicine

## 2019-07-07 ENCOUNTER — Encounter: Payer: Self-pay | Admitting: Family Medicine

## 2019-07-07 VITALS — BP 128/72 | HR 82 | Temp 98.2°F | Resp 18 | Ht 70.0 in | Wt 220.2 lb

## 2019-07-07 DIAGNOSIS — R1012 Left upper quadrant pain: Secondary | ICD-10-CM

## 2019-07-07 DIAGNOSIS — R739 Hyperglycemia, unspecified: Secondary | ICD-10-CM

## 2019-07-07 DIAGNOSIS — L918 Other hypertrophic disorders of the skin: Secondary | ICD-10-CM

## 2019-07-07 DIAGNOSIS — M545 Low back pain, unspecified: Secondary | ICD-10-CM

## 2019-07-07 DIAGNOSIS — E785 Hyperlipidemia, unspecified: Secondary | ICD-10-CM | POA: Diagnosis not present

## 2019-07-07 DIAGNOSIS — G8929 Other chronic pain: Secondary | ICD-10-CM

## 2019-07-07 DIAGNOSIS — I10 Essential (primary) hypertension: Secondary | ICD-10-CM | POA: Diagnosis not present

## 2019-07-07 LAB — COMPREHENSIVE METABOLIC PANEL
ALT: 37 U/L (ref 0–53)
AST: 26 U/L (ref 0–37)
Albumin: 4.4 g/dL (ref 3.5–5.2)
Alkaline Phosphatase: 78 U/L (ref 39–117)
BUN: 13 mg/dL (ref 6–23)
CO2: 27 mEq/L (ref 19–32)
Calcium: 9.6 mg/dL (ref 8.4–10.5)
Chloride: 106 mEq/L (ref 96–112)
Creatinine, Ser: 1.1 mg/dL (ref 0.40–1.50)
GFR: 79.76 mL/min (ref >60.00)
Glucose, Bld: 105 mg/dL — ABNORMAL HIGH (ref 70–99)
Potassium: 4.2 mEq/L (ref 3.5–5.1)
Sodium: 139 mEq/L (ref 135–145)
Total Bilirubin: 0.7 mg/dL (ref 0.2–1.2)
Total Protein: 7.1 g/dL (ref 6.0–8.3)

## 2019-07-07 LAB — LIPID PANEL
Cholesterol: 149 mg/dL (ref 0–200)
HDL: 42.6 mg/dL (ref >39.00)
LDL Cholesterol: 84 mg/dL (ref 0–99)
NonHDL: 106.57
Total CHOL/HDL Ratio: 4
Triglycerides: 113 mg/dL (ref 0.0–149.0)
VLDL: 22.6 mg/dL (ref 0.0–40.0)

## 2019-07-07 LAB — CBC
HCT: 40.7 % (ref 39.0–52.0)
Hemoglobin: 13.6 g/dL (ref 13.0–17.0)
MCHC: 33.5 g/dL (ref 30.0–36.0)
MCV: 85.3 fl (ref 78.0–100.0)
Platelets: 209 10*3/uL (ref 150.0–400.0)
RBC: 4.78 Mil/uL (ref 4.22–5.81)
RDW: 13.9 % (ref 11.5–15.5)
WBC: 6.5 10*3/uL (ref 4.0–10.5)

## 2019-07-07 LAB — TSH: TSH: 1.56 u[IU]/mL (ref 0.35–4.50)

## 2019-07-07 LAB — HEMOGLOBIN A1C: Hgb A1c MFr Bld: 6 % (ref 4.6–6.5)

## 2019-07-07 MED ORDER — HYOSCYAMINE SULFATE 0.125 MG PO TBDP: 0.1250 mg | ORAL_TABLET | ORAL | 2 refills | Status: DC | PRN

## 2019-07-07 MED FILL — ANASPAZ 0.125 MG TABLET ODT: 0.125 | 5 days supply | Qty: 30 | Fill #0

## 2019-07-07 NOTE — Assessment & Plan Note (Signed)
hgba1c acceptable, minimize simple carbs. Increase exercise as tolerated. Continue current meds 

## 2019-07-07 NOTE — Assessment & Plan Note (Signed)
Well controlled, no changes to meds. Encouraged heart healthy diet such as the DASH diet and exercise as tolerated.  °

## 2019-07-07 NOTE — Assessment & Plan Note (Signed)
Tolerating statin, encouraged heart healthy diet, avoid trans fats, minimize simple carbs and saturated fats. Increase exercise as tolerated 

## 2019-07-07 NOTE — Assessment & Plan Note (Signed)
Has been having trouble with LUQ pain for the past 2 weeks or so. Notes nausea intermittently, pain is present frequently and feels like a spasm then pasess. No change with eating but does seem to be affected by position changes. Proceed with abdominal ultrasound and given refill on Hyoscyamine which has helped him in the past. If no obvious cause found but symptoms persist he will need to be seen by gastroenterology again.

## 2019-07-07 NOTE — Progress Notes (Signed)
Subjective:    Patient ID: [name], male    DOB: August 20, 1945, 74 y.o.   MRN: [id number]  No chief complaint on file.   HPI Patient is in today for follow up on chronic medical concerns including hypertension, hyperglycemia, hyperlipidemia and more. He has not been seen in over a year but until the last couple of weeks he has been having left upper quadrat pain intermittently. Has some low grade constipation and he is able to move them every couple of days. No bloody or tarry stool. He is maintaining quarantine well and trying to maintain a heart healthy. Denies CP/palp/SOB/HA/congestion/fevers/GI or GU c/o. Taking meds as prescribed  Past Medical History:  Diagnosis Date  . Abnormal CT scan, gastrointestinal tract suspect angioedema   . Anemia 03/16/2016   IRON  . Anxiety   . Arthritis   . Back pain 10/10/2012  . Fatty liver 2005   seen on 2005 ultrasound,05/2015 CT.   Marland Kitchen Hemorrhoids 08/22/2016  . Hemorrhoids, internal, with bleeding 2005   internal and external. 2005 band ligation (dr Ferdinand Lango in [facility])  . Hyperglycemia 08/21/2013  . Hyperlipidemia   . Hypertension   . Nocturia 08/21/2013  . Obesity (BMI 30.0-34.9)   . Osteoporosis   . Pneumonia    as a child  . Rectal bleeding 07/30/2014  . Renal cyst   . SBO (small bowel obstruction) ([place])   . 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 Unspecified constipation 02/06/2014  . Vitamin D deficiency     Past Surgical History:  Procedure Laterality Date  . COLONOSCOPY  before 2005, 2012, 2013, 08/2014.   Marland Kitchen ENTEROSCOPY N/A 10/05/2017   Procedure: ENTEROSCOPY;  Surgeon: Gatha Mayer, MD;  Location: [facility];  Service: Endoscopy;  Laterality: N/A;  . FRACTURE SURGERY Left 1989   leg  . HEMORRHOID BANDING  2005  . KNEE SURGERY Left 2010   arthroscopy, torn carilage  . KNEE SURGERY Right 2012   arthroscopy  . TONSILLECTOMY  1957  . VENTRAL HERNIA REPAIR N/A 06/03/2018   Procedure: LAPAROSCOPIC VENTRAL WALL   HERNIA  REPAIR WITH MESH  ERAS PATHWAY;  Surgeon: Michael Boston, MD;  Location: [facility];  Service: General;  Laterality: N/A;    Family History  Problem Relation Age of Onset  . Stroke Mother   . Aneurysm Mother        brain  . Arthritis Father   . Liver cancer Father        liver, atomic testing in [employer]  . Gallbladder disease Father   . Hyperlipidemia Sister   . Hypertension Sister   . Obesity Sister   . Diabetes Sister   . Diabetes Brother   . Hyperlipidemia Sister   . Hypertension Sister   . Obesity Sister   . Diabetes Sister   . Aneurysm Brother        brain  . COPD Brother   . Gallbladder disease Sister        x4  . Colon cancer Neg Hx   . Colon polyps Neg Hx   . Esophageal cancer Neg Hx   . Rectal cancer Neg Hx   . Stomach cancer Neg Hx     Social History   Socioeconomic History  . Marital status: Married    Spouse name: Not on file  . Number of children: 1  . Years of education: Not on file  . Highest education level: Not on file  Occupational History  .  Occupation: Retired    Fish farm manager: [employer]  Tobacco Use  . Smoking status: Never Smoker  . Smokeless tobacco: Never Used  Substance and Sexual Activity  . Alcohol use: Yes    Alcohol/week: 2.0 standard drinks    Types: 2 Standard drinks or equivalent per week    Comment: Occassionally  . Drug use: No  . Sexual activity: Yes  Other Topics Concern  . Not on file  Social History Narrative   Married - 1 child   Retired from [employer], no tbacco/drug use   Social Determinants of Radio broadcast assistant Strain:   . Difficulty of Paying Living Expenses: Not on file  Food Insecurity:   . Worried About Charity fundraiser in the Last Year: Not on file  . Ran Out of Food in the Last Year: Not on file  Transportation Needs:   . Lack of Transportation (Medical): Not on file  . Lack of Transportation (Non-Medical): Not on file  Physical Activity:   . Days of Exercise per  Week: Not on file  . Minutes of Exercise per Session: Not on file  Stress:   . Feeling of Stress : Not on file  Social Connections:   . Frequency of Communication with Friends and Family: Not on file  . Frequency of Social Gatherings with Friends and Family: Not on file  . Attends Religious Services: Not on file  . Active Member of Clubs or Organizations: Not on file  . Attends Archivist Meetings: Not on file  . Marital Status: Not on file  Intimate Partner Violence:   . Fear of Current or Ex-Partner: Not on file  . Emotionally Abused: Not on file  . Physically Abused: Not on file  . Sexually Abused: Not on file    Outpatient Medications Prior to Visit  Medication Sig Dispense Refill  . ALPRAZolam (XANAX) 1 MG tablet Take 1 mg by mouth at bedtime as needed for anxiety.    Marland Kitchen amLODipine (NORVASC) 5 MG tablet TAKE 1 TABLET DAILY 90 tablet 3  . azelastine (OPTIVAR) 0.05 % ophthalmic solution Place 1 drop into the left eye 2 (two) times daily. NEEDS OV BEFORE ANY MORE REFILLS 6 mL 0  . fluticasone (FLONASE) 50 MCG/ACT nasal spray Place 2 sprays into both nostrils daily as needed for allergies or rhinitis.    . hydrocortisone (HEMMOREX-HC) 25 MG suppository Place 1 suppository (25 mg total) rectally 2 (two) times daily as needed for hemorrhoids or anal itching. 12 suppository 3  . ibuprofen (ADVIL,MOTRIN) 200 MG tablet Take 400 mg by mouth every 6 (six) hours as needed for moderate pain.    Marland Kitchen losartan (COZAAR) 50 MG tablet TAKE 1 TABLET DAILY 90 tablet 3  . metoprolol succinate (TOPROL-XL) 50 MG 24 hr tablet TAKE 1 TABLET DAILY . TAKE WITH OR IMMEDIATELY FOLLOWING A MEAL 90 tablet 3  . Multiple Vitamins-Minerals (MULTIVITAMIN ADULT PO) Take 1 tablet by mouth daily.    . Pitavastatin Calcium (LIVALO) 2 MG TABS TAKE 1 TABLET DAILY 90 tablet 3  . polyethylene glycol (MIRALAX / GLYCOLAX) packet Take 17 g by mouth every Monday, Wednesday, and Friday.     . traMADol (ULTRAM) 50 MG  tablet Take 1-2 tablets (50-100 mg total) by mouth every 6 (six) hours as needed for moderate pain or severe pain. 20 tablet 0   No facility-administered medications prior to visit.    No Known Allergies  Review of Systems  Constitutional: Negative for fever and malaise/fatigue.  HENT: Negative for congestion.   Eyes: Negative for blurred vision.  Respiratory: Negative for shortness of breath.   Cardiovascular: Negative for chest pain, palpitations and leg swelling.  Gastrointestinal: Positive for abdominal pain and nausea. Negative for blood in stool.  Genitourinary: Negative for dysuria and frequency.  Musculoskeletal: Positive for back pain. Negative for falls.  Skin: Negative for rash.  Neurological: Negative for dizziness, loss of consciousness and headaches.  Endo/Heme/Allergies: Negative for environmental allergies.  Psychiatric/Behavioral: Negative for depression. The patient is not nervous/anxious.        Objective:    Physical Exam Vitals and nursing note reviewed.  Constitutional:      General: He is not in acute distress.    Appearance: He is well-developed.  HENT:     Head: Normocephalic and atraumatic.     Nose: Nose normal.  Eyes:     General:        Right eye: No discharge.        Left eye: No discharge.  Cardiovascular:     Rate and Rhythm: Normal rate and regular rhythm.     Heart sounds: No murmur.  Pulmonary:     Effort: Pulmonary effort is normal.     Breath sounds: Normal breath sounds.  Abdominal:     General: Bowel sounds are normal.     Palpations: Abdomen is soft.     Tenderness: There is no abdominal tenderness.  Musculoskeletal:     Cervical back: Normal range of motion and neck supple.  Skin:    General: Skin is warm and dry.  Neurological:     Mental Status: He is alert and oriented to person, place, and time.     BP 128/72 (BP Location: Left Arm, Patient Position: Sitting, Cuff Size: Normal)   Pulse 82   Temp 98.2 F (36.8 C)  (Temporal)   Resp 18   Ht 5\' 10"  (1.778 m)   Wt 220 lb 3.2 oz (99.9 kg)   SpO2 98%   BMI 31.60 kg/m  Wt Readings from Last 3 Encounters:  07/07/19 220 lb 3.2 oz (99.9 kg)  03/16/19 212 lb (96.2 kg)  05/24/18 216 lb (98 kg)    Diabetic Foot Exam - Simple   No data filed     Lab Results  Component Value Date   WBC 6.5 07/07/2019   HGB 13.6 07/07/2019   HCT 40.7 07/07/2019   PLT 209.0 07/07/2019   GLUCOSE 105 (H) 07/07/2019   CHOL 149 07/07/2019   TRIG 113.0 07/07/2019   HDL 42.60 07/07/2019   LDLCALC 84 07/07/2019   ALT 37 07/07/2019   AST 26 07/07/2019   NA 139 07/07/2019   K 4.2 07/07/2019   CL 106 07/07/2019   CREATININE 1.10 07/07/2019   BUN 13 07/07/2019   CO2 27 07/07/2019   TSH 1.56 07/07/2019   PSA 1.08 08/07/2014   HGBA1C 6.0 07/07/2019    Lab Results  Component Value Date   TSH 1.56 07/07/2019   Lab Results  Component Value Date   WBC 6.5 07/07/2019   HGB 13.6 07/07/2019   HCT 40.7 07/07/2019   MCV 85.3 07/07/2019   PLT 209.0 07/07/2019   Lab Results  Component Value Date   NA 139 07/07/2019   K 4.2 07/07/2019   CO2 27 07/07/2019   GLUCOSE 105 (H) 07/07/2019   BUN 13 07/07/2019   CREATININE 1.10 07/07/2019   BILITOT 0.7 07/07/2019   ALKPHOS 78  07/07/2019   AST 26 07/07/2019   ALT 37 07/07/2019   PROT 7.1 07/07/2019   ALBUMIN 4.4 07/07/2019   CALCIUM 9.6 07/07/2019   ANIONGAP 6 05/24/2018   GFR 79.76 07/07/2019   Lab Results  Component Value Date   CHOL 149 07/07/2019   Lab Results  Component Value Date   HDL 42.60 07/07/2019   Lab Results  Component Value Date   LDLCALC 84 07/07/2019   Lab Results  Component Value Date   TRIG 113.0 07/07/2019   Lab Results  Component Value Date   CHOLHDL 4 07/07/2019   Lab Results  Component Value Date   HGBA1C 6.0 07/07/2019       Assessment & Plan:   Problem List Items Addressed This Visit    Hyperlipidemia - Primary    Tolerating statin, encouraged heart healthy diet,  avoid trans fats, minimize simple carbs and saturated fats. Increase exercise as tolerated      Relevant Orders   Lipid panel (Completed)   Hypertension, essential     Well controlled, no changes to meds. Encouraged heart healthy diet such as the DASH diet and exercise as tolerated.       Relevant Orders   CBC (Completed)   CMP (Completed)   TSH (Completed)   Hyperglycemia    hgba1c acceptable, minimize simple carbs. Increase exercise as tolerated. Continue current meds      Relevant Orders   A1C (Completed)   Low back pain    He is following with [facility], dr Rolena Infante and his last injection was about 8 months ago and they are helpful for awhile. His pain is increasing again. Encouraged moist heat and gentle stretching as tolerated. May try NSAIDs and prescription meds as directed and report if symptoms worsen or seek immediate care      Skin tags, multiple acquired    Several on his face are easily irritated. He is referred to dermatology for further consideration.       Relevant Orders   Ambulatory referral to Dermatology   LUQ pain    Has been having trouble with LUQ pain for the past 2 weeks or so. Notes nausea intermittently, pain is present frequently and feels like a spasm then pasess. No change with eating but does seem to be affected by position changes. Proceed with abdominal ultrasound and given refill on Hyoscyamine which has helped him in the past. If no obvious cause found but symptoms persist he will need to be seen by gastroenterology again.       Relevant Orders   US Abdomen Complete      I am having Cheo Brass start on hyoscyamine. I am also having him maintain his polyethylene glycol, Multiple Vitamins-Minerals (MULTIVITAMIN ADULT PO), hydrocortisone, ALPRAZolam, fluticasone, ibuprofen, traMADol, azelastine, losartan, amLODipine, metoprolol succinate, and Livalo.  Meds ordered this encounter  Medications  . hyoscyamine (ANASPAZ) 0.125 MG TBDP  disintergrating tablet    Sig: Place 1 tablet (0.125 mg total) under the tongue every 4 (four) hours as needed for cramping.    Dispense:  30 tablet    Refill:  2     Penni Homans, MD

## 2019-07-07 NOTE — Patient Instructions (Addendum)
Miralax with Benefiber once to twice a day  Melatonin 3-6 mg at night Multivitamin with selenium EC Aspirin 81 mg daily  Weekly vitals  Abdominal Pain, Adult Pain in the abdomen (abdominal pain) can be caused by many things. Often, abdominal pain is not serious and it gets better with no treatment or by being treated at home. However, sometimes abdominal pain is serious. Your health care provider will ask questions about your medical history and do a physical exam to try to determine the cause of your abdominal pain. Follow these instructions at home:  Medicines  Take over-the-counter and prescription medicines only as told by your health care provider.  Do not take a laxative unless told by your health care provider. General instructions  Watch your condition for any changes.  Drink enough fluid to keep your urine pale yellow.  Keep all follow-up visits as told by your health care provider. This is important. Contact a health care provider if:  Your abdominal pain changes or gets worse.  You are not hungry or you lose weight without trying.  You are constipated or have diarrhea for more than 2-3 days.  You have pain when you urinate or have a bowel movement.  Your abdominal pain wakes you up at night.  Your pain gets worse with meals, after eating, or with certain foods.  You are vomiting and cannot keep anything down.  You have a fever.  You have blood in your urine. Get help right away if:  Your pain does not go away as soon as your health care provider told you to expect.  You cannot stop vomiting.  Your pain is only in areas of the abdomen, such as the right side or the left lower portion of the abdomen. Pain on the right side could be caused by appendicitis.  You have bloody or black stools, or stools that look like tar.  You have severe pain, cramping, or bloating in your abdomen.  You have signs of dehydration, such as: ? Dark urine, very little urine,  or no urine. ? Cracked lips. ? Dry mouth. ? Sunken eyes. ? Sleepiness. ? Weakness.  You have trouble breathing or chest pain. Summary  Often, abdominal pain is not serious and it gets better with no treatment or by being treated at home. However, sometimes abdominal pain is serious.  Watch your condition for any changes.  Take over-the-counter and prescription medicines only as told by your health care provider.  Contact a health care provider if your abdominal pain changes or gets worse.  Get help right away if you have severe pain, cramping, or bloating in your abdomen. This information is not intended to replace advice given to you by your health care provider. Make sure you discuss any questions you have with your health care provider. Document Revised: 10/25/2018 Document Reviewed: 10/25/2018 Elsevier Patient Education  [place].

## 2019-07-07 NOTE — Assessment & Plan Note (Signed)
Several on his face are easily irritated. He is referred to dermatology for further consideration.

## 2019-07-07 NOTE — Assessment & Plan Note (Signed)
He is following with [facility], dr Rolena Infante and his last injection was about 8 months ago and they are helpful for awhile. His pain is increasing again. Encouraged moist heat and gentle stretching as tolerated. May try NSAIDs and prescription meds as directed and report if symptoms worsen or seek immediate care

## 2019-07-11 ENCOUNTER — Ambulatory Visit (HOSPITAL_BASED_OUTPATIENT_CLINIC_OR_DEPARTMENT_OTHER)
Admission: RE | Admit: 2019-07-11 | Discharge: 2019-07-11 | Disposition: A | Discharge status: Home / Self Care | Payer: Medicare Other | Source: Ambulatory Visit | Attending: Family Medicine | Admitting: Family Medicine

## 2019-07-11 ENCOUNTER — Other Ambulatory Visit: Payer: Self-pay

## 2019-07-11 DIAGNOSIS — N281 Cyst of kidney, acquired: Secondary | ICD-10-CM | POA: Diagnosis not present

## 2019-07-11 DIAGNOSIS — R1012 Left upper quadrant pain: Secondary | ICD-10-CM | POA: Insufficient documentation

## 2019-07-16 ENCOUNTER — Other Ambulatory Visit: Payer: Self-pay | Admitting: Family Medicine

## 2019-07-16 DIAGNOSIS — N2889 Other specified disorders of kidney and ureter: Secondary | ICD-10-CM

## 2019-07-18 DIAGNOSIS — L821 Other seborrheic keratosis: Secondary | ICD-10-CM | POA: Diagnosis not present

## 2019-07-18 DIAGNOSIS — D485 Neoplasm of uncertain behavior of skin: Secondary | ICD-10-CM | POA: Diagnosis not present

## 2019-07-18 DIAGNOSIS — L820 Inflamed seborrheic keratosis: Secondary | ICD-10-CM | POA: Diagnosis not present

## 2019-07-18 DIAGNOSIS — B079 Viral wart, unspecified: Secondary | ICD-10-CM | POA: Diagnosis not present

## 2019-07-20 ENCOUNTER — Ambulatory Visit (HOSPITAL_BASED_OUTPATIENT_CLINIC_OR_DEPARTMENT_OTHER)
Admission: RE | Admit: 2019-07-20 | Discharge: 2019-07-20 | Disposition: A | Discharge status: Home / Self Care | Payer: Medicare Other | Source: Ambulatory Visit | Attending: Family Medicine | Admitting: Family Medicine

## 2019-07-20 ENCOUNTER — Encounter (HOSPITAL_BASED_OUTPATIENT_CLINIC_OR_DEPARTMENT_OTHER): Payer: Self-pay

## 2019-07-20 ENCOUNTER — Other Ambulatory Visit: Payer: Self-pay

## 2019-07-20 DIAGNOSIS — N2889 Other specified disorders of kidney and ureter: Secondary | ICD-10-CM

## 2019-07-20 DIAGNOSIS — N281 Cyst of kidney, acquired: Secondary | ICD-10-CM | POA: Diagnosis not present

## 2019-07-20 DIAGNOSIS — K760 Fatty (change of) liver, not elsewhere classified: Secondary | ICD-10-CM | POA: Diagnosis not present

## 2019-07-20 MED ORDER — IOHEXOL 350 MG/ML SOLN
100.0000 mL | Freq: Once | INTRAVENOUS | Status: AC | PRN
  Administered 2019-07-20: 100 mL via INTRAVENOUS

## 2019-08-03 DIAGNOSIS — H2513 Age-related nuclear cataract, bilateral: Secondary | ICD-10-CM | POA: Diagnosis not present

## 2019-08-10 DIAGNOSIS — M5136 Other intervertebral disc degeneration, lumbar region: Secondary | ICD-10-CM | POA: Diagnosis not present

## 2019-09-06 DIAGNOSIS — M5136 Other intervertebral disc degeneration, lumbar region: Secondary | ICD-10-CM | POA: Diagnosis not present

## 2020-01-18 ENCOUNTER — Ambulatory Visit (INDEPENDENT_AMBULATORY_CARE_PROVIDER_SITE_OTHER): Payer: Medicare Other

## 2020-01-18 ENCOUNTER — Other Ambulatory Visit: Payer: Self-pay

## 2020-01-18 ENCOUNTER — Ambulatory Visit: Payer: Self-pay

## 2020-01-18 ENCOUNTER — Ambulatory Visit (INDEPENDENT_AMBULATORY_CARE_PROVIDER_SITE_OTHER): Payer: Medicare Other | Admitting: Family Medicine

## 2020-01-18 ENCOUNTER — Encounter: Payer: Self-pay | Admitting: Family Medicine

## 2020-01-18 VITALS — BP 126/76 | HR 80 | Ht 70.0 in | Wt 221.8 lb

## 2020-01-18 DIAGNOSIS — M19012 Primary osteoarthritis, left shoulder: Secondary | ICD-10-CM | POA: Diagnosis not present

## 2020-01-18 DIAGNOSIS — M25512 Pain in left shoulder: Secondary | ICD-10-CM

## 2020-01-18 DIAGNOSIS — G8929 Other chronic pain: Secondary | ICD-10-CM

## 2020-01-18 NOTE — Progress Notes (Signed)
Subjective:    CC: L shoulder pain  I, Molly Weber, LAT, ATC, am serving as scribe for Dr. Lynne Leader.  HPI: Pt is a 74 y/o RHD male presenting w/ c/o L shoulder pain x 8-10 months w/ no known MOI.  He locates his pain to his L lateral shoulder.  He denies any injury.  He had a x-ray April 2019 that showed moderate DJD left glenohumeral joint.  Radiating pain: yes into the L UE down to the ulnar forearm Neck pain: no L shoulder mechanical symptoms: no L UE numbness/tingling: no Aggravating factors: L shoulder overhead ROM and horiz aBd Treatments tried: Aspercreme; exercises at the gym; IBU  Pertinent review of Systems: No fevers or chills  Relevant historical information: Hypertension   Objective:    Vitals:   01/18/20 1308  BP: 126/76  Pulse: 80  SpO2: 97%   General: Well Developed, well nourished, and in no acute distress.   MSK: Left shoulder normal-appearing nontender decreased shoulder range of motion.  Intact strength negative impingement testing. C-spine normal-appearing nontender normal cervical motion negative Spurling's test.  Upper extremity strength reflexes and sensation are intact.  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  No acute fractures. Await formal radiology review  Diagnostic Limited MSK Ultrasound of: Left shoulder Biceps tendon intact normal-appearing Subscapularis tendon intact. Supraspinatus tendon intact with increased subacromial bursa thickness present. Infraspinatus tendon intact. Posterior joint line osteophyte and degenerative appearing AC joint narrowed degenerative appearing with effusion. Impression: Subacromial bursitis glenohumeral DJD AC DJD.  Procedure: Real-time Ultrasound Guided Injection of left shoulder posterior glenohumeral joint Device: Philips Affiniti 50G Images permanently stored and available for review  in the ultrasound unit. Verbal informed consent obtained.  Discussed risks and benefits of procedure. Warned about infection bleeding damage to structures skin hypopigmentation and fat atrophy among others. Patient expresses understanding and agreement Time-out conducted.   Noted no overlying erythema, induration, or other signs of local infection.   Skin prepped in a sterile fashion.   Local anesthesia: Topical Ethyl chloride.   With sterile technique and under real time ultrasound guidance:  40 mg of Kenalog and 2 mL of Marcaine injected easily.   Completed without difficulty   Pain immediately resolved suggesting accurate placement of the medication.   Advised to call if fevers/chills, erythema, induration, drainage, or persistent bleeding.   Images permanently stored and available for review in the ultrasound unit.  Impression: Technically successful ultrasound guided injection.     Impression and Recommendations:    Assessment and Plan: 74 y.o. 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  Patient may have subacromial bursitis as well.  Patient had significant improvement following glenohumeral injection.  Plan for home exercise program and injection as above.  Recheck back as needed.Marland Kitchen  PDMP not reviewed this encounter. Orders Placed This Encounter  Procedures  . [place] LIMITED JOINT SPACE STRUCTURES UP LEFT(NO LINKED CHARGES)    Order Specific Question:   Reason for Exam (SYMPTOM  OR DIAGNOSIS REQUIRED)    Answer:   L shoulder pain    Order Specific Question:   Preferred imaging location?    Answer:   [place]  . DG Shoulder Left    Standing Status:   Future    Number of Occurrences:   1    Standing Expiration Date:   01/17/2021    Order Specific Question:   Reason  for Exam (SYMPTOM  OR DIAGNOSIS REQUIRED)    Answer:   eval left shoulder pain    Order Specific Question:   Preferred imaging location?    Answer:   [facility]    Order  Specific Question:   Radiology Contrast Protocol - do NOT remove file path    Answer:   [email or web address]   No orders of the defined types were placed in this encounter.   Discussed warning signs or symptoms. Please see discharge instructions. Patient expresses understanding.   The above documentation has been reviewed and is accurate and complete Lynne Leader, M.D.

## 2020-01-18 NOTE — Patient Instructions (Signed)
Thank you for coming in today. Get xray today on the way out.  Call or go to the ER if you develop a large red swollen joint with extreme pain or oozing puss.   Continue home exercises.  If not improving let me know and I will arrange for PT.

## 2020-01-20 NOTE — Progress Notes (Signed)
X-ray left shoulder shows medium arthritis of the shoulder joint.

## 2020-01-24 DIAGNOSIS — U071 COVID-19: Secondary | ICD-10-CM | POA: Diagnosis not present

## 2020-02-16 ENCOUNTER — Telehealth: Payer: Self-pay | Admitting: Family Medicine

## 2020-02-16 NOTE — Telephone Encounter (Signed)
Pt dropped off document (Covid 19 Vaccination card - 2 page) for provider to have on pt's chart. Document put at front office tray under providers name.

## 2020-02-16 NOTE — Telephone Encounter (Signed)
02/16/20-Recorded updated Covid 19-Vaccination Record [facility] [employer]) in pt.'s chart.

## 2020-03-19 ENCOUNTER — Other Ambulatory Visit (HOSPITAL_BASED_OUTPATIENT_CLINIC_OR_DEPARTMENT_OTHER): Payer: Self-pay | Admitting: Urology

## 2020-03-19 MED FILL — SILDENAFIL CITRATE 20 MG TA: 20 | 7 days supply | Qty: 20 | Fill #0

## 2020-03-20 DIAGNOSIS — Z125 Encounter for screening for malignant neoplasm of prostate: Secondary | ICD-10-CM | POA: Diagnosis not present

## 2020-03-26 DIAGNOSIS — R3912 Poor urinary stream: Secondary | ICD-10-CM | POA: Diagnosis not present

## 2020-03-26 DIAGNOSIS — N528 Other male erectile dysfunction: Secondary | ICD-10-CM | POA: Diagnosis not present

## 2020-03-26 DIAGNOSIS — N401 Enlarged prostate with lower urinary tract symptoms: Secondary | ICD-10-CM | POA: Diagnosis not present

## 2020-03-27 ENCOUNTER — Ambulatory Visit: Payer: Medicare Other | Attending: Internal Medicine

## 2020-03-27 ENCOUNTER — Other Ambulatory Visit (HOSPITAL_BASED_OUTPATIENT_CLINIC_OR_DEPARTMENT_OTHER): Payer: Self-pay | Admitting: Internal Medicine

## 2020-03-27 DIAGNOSIS — Z23 Encounter for immunization: Secondary | ICD-10-CM

## 2020-03-27 NOTE — Progress Notes (Signed)
   Covid-19 Vaccination Clinic  Name:  Princ[place] Blizzard    MRN: [id number] DOB: 1945-09-26  03/27/2020  Mr. Fredell was observed post Covid-19 immunization for 15 minutes without incident. He was provided with Vaccine Information Sheet and instruction to access the V-Safe system. Vaccinated By: Acey Lav.  Mr. Ritchey was instructed to call 911 with any severe reactions post vaccine: Marland Kitchen Difficulty breathing  . Swelling of face and throat  . A fast heartbeat  . A bad rash all over body  . Dizziness and weakness

## 2020-03-30 ENCOUNTER — Other Ambulatory Visit: Payer: Self-pay | Admitting: Family Medicine

## 2020-03-30 MED FILL — AZELASTINE HCL 0.05 % SOLN: 0.05 | 60 days supply | Qty: 6 | Fill #0

## 2020-03-30 MED FILL — PFIZER-BIONTECH COVID-19 VA: 30 | 1 days supply | Qty: 0 | Fill #0

## 2020-03-30 NOTE — Telephone Encounter (Signed)
Refill received for Optivar ophthalmic solution.  Last OV: 07/07/2019, no future appt scheduled Last Fill: 03/14/2019 #3mL and 0RF

## 2020-04-03 ENCOUNTER — Other Ambulatory Visit (HOSPITAL_BASED_OUTPATIENT_CLINIC_OR_DEPARTMENT_OTHER): Payer: Self-pay | Admitting: Internal Medicine

## 2020-04-03 MED FILL — FLUAD QUADRIVALENT 0.5 ML P: 0.5 | 1 days supply | Qty: 1 | Fill #0

## 2020-04-09 ENCOUNTER — Telehealth: Payer: Self-pay | Admitting: Family Medicine

## 2020-04-09 NOTE — Telephone Encounter (Signed)
Copied from [place] [id number]. Topic: Medicare AWV >> Apr 09, 2020 11:58 AM Cher Nakai R wrote: Reason for CRM:   04/09/2020 LM to Cancel AWVS on Apr 26, 2020. NHA not availalble this day - Will call back to reschedule once available-srs

## 2020-04-18 ENCOUNTER — Other Ambulatory Visit: Payer: Self-pay | Admitting: Family Medicine

## 2020-04-18 MED FILL — FLUTICASONE PROP 50 MCG SPR: 50 | 30 days supply | Qty: 16 | Fill #0

## 2020-04-26 ENCOUNTER — Ambulatory Visit: Payer: Self-pay | Admitting: *Deleted

## 2020-05-22 ENCOUNTER — Telehealth: Payer: Self-pay

## 2020-05-22 DIAGNOSIS — M19012 Primary osteoarthritis, left shoulder: Secondary | ICD-10-CM

## 2020-05-22 DIAGNOSIS — G8929 Other chronic pain: Secondary | ICD-10-CM

## 2020-05-22 NOTE — Telephone Encounter (Signed)
Patient calling stating that he was wanting to go to ortho care on [address] for his shoulder. Marcene Duos. Is who he wants to get in with.

## 2020-05-22 NOTE — Addendum Note (Signed)
Addended by: Gregor Hams on: 05/22/2020 12:31 PM   Modules accepted: Orders

## 2020-05-22 NOTE — Telephone Encounter (Signed)
Referral placed.  You should hear from Dr. Randel Pigg office soon.  He is a great choice.

## 2020-05-22 NOTE — Telephone Encounter (Signed)
Sent to [facility]  

## 2020-06-04 ENCOUNTER — Encounter: Payer: Self-pay | Admitting: Orthopedic Surgery

## 2020-06-04 ENCOUNTER — Ambulatory Visit (INDEPENDENT_AMBULATORY_CARE_PROVIDER_SITE_OTHER): Payer: Medicare Other | Admitting: Orthopedic Surgery

## 2020-06-04 ENCOUNTER — Other Ambulatory Visit: Payer: Self-pay

## 2020-06-04 VITALS — Ht 70.0 in | Wt 220.0 lb

## 2020-06-04 DIAGNOSIS — M19012 Primary osteoarthritis, left shoulder: Secondary | ICD-10-CM | POA: Diagnosis not present

## 2020-06-08 ENCOUNTER — Encounter: Payer: Self-pay | Admitting: Orthopedic Surgery

## 2020-06-08 DIAGNOSIS — M19012 Primary osteoarthritis, left shoulder: Secondary | ICD-10-CM

## 2020-06-08 MED ORDER — BUPIVACAINE HCL 0.5 % IJ SOLN
9.0000 mL | INTRAMUSCULAR | Status: AC | PRN
  Administered 2020-06-08: 9 mL via INTRA_ARTICULAR

## 2020-06-08 MED ORDER — METHYLPREDNISOLONE ACETATE 40 MG/ML IJ SUSP
40.0000 mg | INTRAMUSCULAR | Status: AC | PRN
  Administered 2020-06-08: 40 mg via INTRA_ARTICULAR

## 2020-06-08 MED ORDER — LIDOCAINE HCL 1 % IJ SOLN
5.0000 mL | INTRAMUSCULAR | Status: AC | PRN
  Administered 2020-06-08: 5 mL

## 2020-06-08 MED FILL — SILDENAFIL CITRATE 20 MG TA: 20 | 7 days supply | Qty: 20 | Fill #1

## 2020-06-08 NOTE — Progress Notes (Signed)
Office Visit Note   Patient: [name]           Date of Birth: 10/04/1945           MRN: [id number] Visit Date: 06/04/2020 Requested by: Charles Hams, MD [place],  [place] [id number] PCP: Charles Lukes, MD  Subjective: Chief Complaint  Patient presents with  . Left Shoulder - Pain    HPI: [name] is an active 74 year old patient with chronic left shoulder pain.  Has a diagnosis of left shoulder arthritis.  Had an injection in July which gave him very good relief for several days.  He describes decreased and painful range of motion.  Ibuprofen gives him some relief.  He also tries Voltaren gel and patches.  The pain feels like a toothache at night.  Denies any neck pain but has occasional tingling in the left index finger.  He likes to workout at the gym but that has been curtailed due to the shoulder pain.  He is retired.              ROS: All systems reviewed are negative as they relate to the chief complaint within the history of present illness.  Patient denies  fevers or chills.   Assessment & Plan: Visit Diagnoses: No diagnosis found.  Plan: Impression is left shoulder arthritis in an active patient who is beginning to have the type of pain which could necessitate shoulder replacement.  Plan at this time is intra-articular shoulder joint injection with 17-month return.  We did talk today some about shoulder replacement including the risk and benefits and long-term activity modifications which would be required.  We will talk more about that in 4 months at a return office visit.  Follow-Up Instructions: Return in about 4 months (around 10/03/2020).   Orders:  No orders of the defined types were placed in this encounter.  No orders of the defined types were placed in this encounter.     Procedures: Large Joint Inj: L glenohumeral on 06/08/2020 7:17 AM Indications: diagnostic evaluation and pain Details: 18 G 1.5 in needle, posterior  approach  Arthrogram: No  Medications: 9 mL bupivacaine 0.5 %; 40 mg methylPREDNISolone acetate 40 MG/ML; 5 mL lidocaine 1 % Outcome: tolerated well, no immediate complications Procedure, treatment alternatives, risks and benefits explained, specific risks discussed. Consent was given by the patient. Immediately prior to procedure a time out was called to verify the correct patient, procedure, equipment, support staff and site/side marked as required. Patient was prepped and draped in the usual sterile fashion.       Clinical Data: No additional findings.  Objective: Vital Signs: Ht 5\' 10"  (1.778 m)   Wt 220 lb (99.8 kg)   BMI 31.57 kg/m   Physical Exam:   Constitutional: Patient appears well-developed HEENT:  Head: Normocephalic Eyes:EOM are normal Neck: Normal range of motion Cardiovascular: Normal rate Pulmonary/chest: Effort normal Neurologic: Patient is alert Skin: Skin is warm Psychiatric: Patient has normal mood and affect    Ortho Exam: Ortho exam demonstrates good cervical spine range of motion with 5 out of 5 grip EPL FPL interosseous wrist flexion extension bicep triceps and deltoid strength.  He does have limitation of external rotation on the left to about 40 degrees compared to the right which is 65.  He has forward flexion and abduction both above 90 degrees but not full range of motion.  Rotator cuff strength is actually pretty good infraspinatus supraspinatus and subscap muscle  testing.  Does not have much coarseness consistent with rotator cuff tearing but does have a little bit of coarseness to palpation consistent with early glenohumeral joint arthritis.  Deltoid is functional.  Specialty Comments:  No specialty comments available.  Imaging: No results found.   PMFS History: Patient Active Problem List   Diagnosis Date Noted  . Skin tags, multiple acquired 07/07/2019  . LUQ pain 07/07/2019  . Primary osteoarthritis of right knee 03/16/2019  .  Ventral hernia s/p lap repair with mesh 06/03/2018 06/03/2018  . Hemorrhoids 08/22/2016  . Anemia 03/16/2016  . Right shoulder pain 02/26/2016  . Abnormal CT scan, gastrointestinal tract suspect angioedema   . Abdominal pain   . Hyperglycemia 08/21/2013  . Low back pain 10/10/2012  . Hyperlipidemia   . Hypertension, essential    . History of colon polyps   . Obesity (BMI 30.0-34.9)    Past Medical History:  Diagnosis Date  . Abnormal CT scan, gastrointestinal tract suspect angioedema   . Anemia 03/16/2016   IRON  . Anxiety   . Arthritis   . Back pain 10/10/2012  . Fatty liver 2005   seen on 2005 ultrasound,05/2015 CT.   Marland Kitchen Hemorrhoids 08/22/2016  . Hemorrhoids, internal, with bleeding 2005   internal and external. 2005 band ligation (dr Ferdinand Lango in [facility])  . Hyperglycemia 08/21/2013  . Hyperlipidemia   . Hypertension   . Nocturia 08/21/2013  . Obesity (BMI 30.0-34.9)   . Osteoporosis   . Pneumonia    as a child  . Rectal bleeding 07/30/2014  . Renal cyst   . SBO (small bowel obstruction) ([place])   . 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 Unspecified constipation 02/06/2014  . Vitamin D deficiency     Family History  Problem Relation Age of Onset  . Stroke Mother   . Aneurysm Mother        brain  . Arthritis Father   . Liver cancer Father        liver, atomic testing in [employer]  . Gallbladder disease Father   . Hyperlipidemia Sister   . Hypertension Sister   . Obesity Sister   . Diabetes Sister   . Diabetes Brother   . Hyperlipidemia Sister   . Hypertension Sister   . Obesity Sister   . Diabetes Sister   . Aneurysm Brother        brain  . COPD Brother   . Gallbladder disease Sister        x4  . Colon cancer Neg Hx   . Colon polyps Neg Hx   . Esophageal cancer Neg Hx   . Rectal cancer Neg Hx   . Stomach cancer Neg Hx     Past Surgical History:  Procedure Laterality Date  . COLONOSCOPY  before 2005, 2012, 2013, 08/2014.   Marland Kitchen ENTEROSCOPY N/A  10/05/2017   Procedure: ENTEROSCOPY;  Surgeon: Gatha Mayer, MD;  Location: [facility];  Service: Endoscopy;  Laterality: N/A;  . FRACTURE SURGERY Left 1989   leg  . HEMORRHOID BANDING  2005  . KNEE SURGERY Left 2010   arthroscopy, torn carilage  . KNEE SURGERY Right 2012   arthroscopy  . TONSILLECTOMY  1957  . VENTRAL HERNIA REPAIR N/A 06/03/2018   Procedure: LAPAROSCOPIC VENTRAL WALL  HERNIA  REPAIR WITH MESH  ERAS PATHWAY;  Surgeon: Michael Boston, MD;  Location: [facility];  Service: General;  Laterality: N/A;   Social History   Occupational History  .  Occupation: Retired    Fish farm manager: [employer]  Tobacco Use  . Smoking status: Never Smoker  . Smokeless tobacco: Never Used  Vaping Use  . Vaping Use: Never used  Substance and Sexual Activity  . Alcohol use: Yes    Alcohol/week: 2.0 standard drinks    Types: 2 Standard drinks or equivalent per week    Comment: Occassionally  . Drug use: No  . Sexual activity: Yes

## 2020-06-19 ENCOUNTER — Other Ambulatory Visit: Payer: Self-pay | Admitting: Family Medicine

## 2020-06-19 NOTE — Telephone Encounter (Signed)
Clarify with patient which strength of alprazolam he is taking prn and how often he takes or feels he should and then we can refill

## 2020-06-19 NOTE — Telephone Encounter (Signed)
Refill request for alprazolam 0.5mg  received, on med list is now alprazolam 1mg . Please advise.

## 2020-06-20 ENCOUNTER — Other Ambulatory Visit: Payer: Self-pay | Admitting: Family Medicine

## 2020-06-20 ENCOUNTER — Other Ambulatory Visit: Payer: Self-pay

## 2020-06-20 MED ORDER — FLUTICASONE PROPIONATE 50 MCG/ACT NA SUSP: 2.0000 | Freq: Every day | NASAL | 3 refills | Status: DC

## 2020-06-20 MED ORDER — ALPRAZOLAM 1 MG PO TABS: 1.0000 mg | ORAL_TABLET | Freq: Every evening | ORAL | 1 refills | Status: DC | PRN

## 2020-06-20 MED ORDER — AZELASTINE HCL 0.05 % OP SOLN: 1.0000 [drp] | Freq: Two times a day (BID) | OPHTHALMIC | 3 refills | Status: DC

## 2020-06-20 NOTE — Telephone Encounter (Signed)
Spoke with patient and he is on the 1mg .

## 2020-06-21 MED FILL — ALPRAZolam 1 MG TABS: 1 | 30 days supply | Qty: 30 | Fill #0

## 2020-06-25 ENCOUNTER — Other Ambulatory Visit: Payer: Self-pay | Admitting: Family Medicine

## 2020-07-13 ENCOUNTER — Other Ambulatory Visit: Payer: Self-pay

## 2020-07-13 ENCOUNTER — Other Ambulatory Visit: Payer: Self-pay | Admitting: Emergency Medicine

## 2020-07-13 ENCOUNTER — Emergency Department (INDEPENDENT_AMBULATORY_CARE_PROVIDER_SITE_OTHER)
Admission: EM | Admit: 2020-07-13 | Discharge: 2020-07-13 | Disposition: A | Discharge status: Home / Self Care | Payer: Medicare Other | Source: Home / Self Care | Attending: Emergency Medicine | Admitting: Emergency Medicine

## 2020-07-13 DIAGNOSIS — R059 Cough, unspecified: Secondary | ICD-10-CM | POA: Diagnosis not present

## 2020-07-13 DIAGNOSIS — J069 Acute upper respiratory infection, unspecified: Secondary | ICD-10-CM

## 2020-07-13 DIAGNOSIS — Z20822 Contact with and (suspected) exposure to covid-19: Secondary | ICD-10-CM | POA: Diagnosis not present

## 2020-07-13 MED ORDER — BENZONATATE 200 MG PO CAPS: 200.0000 mg | ORAL_CAPSULE | Freq: Three times a day (TID) | ORAL | 0 refills | Status: DC | PRN

## 2020-07-13 MED FILL — BENZONATATE 200 MG CAPS: 200 | 10 days supply | Qty: 30 | Fill #0

## 2020-07-13 NOTE — ED Provider Notes (Signed)
HPI  SUBJECTIVE:  [name] is a 75 y.o. male who presents with a "cold" for the past 4 days.  He reports nasal congestion, left frontal sinus pain and pressure, postnasal drip, cough.  No fevers, body aches, headaches, rhinorrhea, sore throat, loss of sense or smell or taste, shortness of breath, wheezing, chest pain, nausea, vomiting, diarrhea, abdominal pain.  No known COVID or fluid exposure.  He got the COVID booster in October and got the flu vaccine.  No antibiotics in the past month.  Antipyretic in the past 6 hours.  He has tried TheraFlu and an unknown prescription nasal spray.  He states that the nasal spray helps.  No aggravating factors.  He states that overall he is getting better.  Past medical history of hypertension, BMI >30.  No history of diabetes, pulmonary disease, coronary disease, chronic kidney disease.  PMD: Mosie Lukes, MD   Past Medical History:  Diagnosis Date  . Abnormal CT scan, gastrointestinal tract suspect angioedema   . Anemia 03/16/2016   IRON  . Anxiety   . Arthritis   . Back pain 10/10/2012  . Fatty liver 2005   seen on 2005 ultrasound,05/2015 CT.   Marland Kitchen Hemorrhoids 08/22/2016  . Hemorrhoids, internal, with bleeding 2005   internal and external. 2005 band ligation (dr Ferdinand Lango in [facility])  . Hyperglycemia 08/21/2013  . Hyperlipidemia   . Hypertension   . Nocturia 08/21/2013  . Obesity (BMI 30.0-34.9)   . Osteoporosis   . Pneumonia    as a child  . Rectal bleeding 07/30/2014  . Renal cyst   . SBO (small bowel obstruction) ([place])   . 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 Unspecified constipation 02/06/2014  . Vitamin D deficiency     Past Surgical History:  Procedure Laterality Date  . COLONOSCOPY  before 2005, 2012, 2013, 08/2014.   Marland Kitchen ENTEROSCOPY N/A 10/05/2017   Procedure: ENTEROSCOPY;  Surgeon: Gatha Mayer, MD;  [facility];  Service: Endoscopy;  Laterality: N/A;  . FRACTURE SURGERY Left 1989   leg  .  HEMORRHOID BANDING  2005  . KNEE SURGERY Left 2010   arthroscopy, torn carilage  . KNEE SURGERY Right 2012   arthroscopy  . TONSILLECTOMY  1957  . VENTRAL HERNIA REPAIR N/A 06/03/2018   Procedure: LAPAROSCOPIC VENTRAL WALL  HERNIA  REPAIR WITH MESH  ERAS PATHWAY;  Surgeon: Michael Boston, MD;  Location: [facility];  Service: General;  Laterality: N/A;    Family History  Problem Relation Age of Onset  . Stroke Mother   . Aneurysm Mother        brain  . Arthritis Father   . Liver cancer Father        liver, atomic testing in [employer]  . Gallbladder disease Father   . Hyperlipidemia Sister   . Hypertension Sister   . Obesity Sister   . Diabetes Sister   . Diabetes Brother   . Hyperlipidemia Sister   . Hypertension Sister   . Obesity Sister   . Diabetes Sister   . Aneurysm Brother        brain  . COPD Brother   . Gallbladder disease Sister        x4  . Colon cancer Neg Hx   . Colon polyps Neg Hx   . Esophageal cancer Neg Hx   . Rectal cancer Neg Hx   . Stomach cancer Neg Hx     Social History   Tobacco  Use  . Smoking status: Never Smoker  . Smokeless tobacco: Never Used  Vaping Use  . Vaping Use: Never used  Substance Use Topics  . Alcohol use: Yes    Alcohol/week: 2.0 standard drinks    Types: 2 Standard drinks or equivalent per week    Comment: Occassionally  . Drug use: No    No current facility-administered medications for this encounter.  Current Outpatient Medications:  .  benzonatate (TESSALON) 200 MG capsule, Take 1 capsule (200 mg total) by mouth 3 (three) times daily as needed for cough., Disp: 30 capsule, Rfl: 0 .  ALPRAZolam (XANAX) 1 MG tablet, Take 1 tablet (1 mg total) by mouth at bedtime as needed for anxiety., Disp: 30 tablet, Rfl: 1 .  amLODipine (NORVASC) 5 MG tablet, Take 1 tablet (5 mg total) by mouth daily., Disp: 90 tablet, Rfl: 0 .  azelastine (OPTIVAR) 0.05 % ophthalmic solution, Place 1 drop into the left eye 2 (two) times daily., Disp: 6  mL, Rfl: 3 .  fluticasone (FLONASE) 50 MCG/ACT nasal spray, Place 2 sprays into both nostrils daily., Disp: 16 g, Rfl: 3 .  hydrocortisone (HEMMOREX-HC) 25 MG suppository, Place 1 suppository (25 mg total) rectally 2 (two) times daily as needed for hemorrhoids or anal itching., Disp: 12 suppository, Rfl: 3 .  hyoscyamine (ANASPAZ) 0.125 MG TBDP disintergrating tablet, Place 1 tablet (0.125 mg total) under the tongue every 4 (four) hours as needed for cramping., Disp: 30 tablet, Rfl: 2 .  ibuprofen (ADVIL,MOTRIN) 200 MG tablet, Take 400 mg by mouth every 6 (six) hours as needed for moderate pain., Disp: , Rfl:  .  losartan (COZAAR) 50 MG tablet, Take 1 tablet (50 mg total) by mouth daily., Disp: 90 tablet, Rfl: 0 .  metoprolol succinate (TOPROL-XL) 50 MG 24 hr tablet, Take 1 tablet (50 mg total) by mouth daily., Disp: 90 tablet, Rfl: 0 .  Multiple Vitamins-Minerals (MULTIVITAMIN ADULT PO), Take 1 tablet by mouth daily., Disp: , Rfl:  .  Pitavastatin Calcium (LIVALO) 2 MG TABS, Take 1 tablet (2 mg total) by mouth daily., Disp: 90 tablet, Rfl: 0 .  polyethylene glycol (MIRALAX / GLYCOLAX) packet, Take 17 g by mouth every Monday, Wednesday, and Friday. , Disp: , Rfl:   No Known Allergies   ROS  As noted in HPI.   Physical Exam  BP (!) 164/93 (BP Location: Right Arm) Comment: took cold and flu meds pta  Pulse 90   Temp 98.9 F (37.2 C) (Oral)   Resp 16   SpO2 96%   Constitutional: Well developed, well nourished, no acute distress Eyes:  EOMI, conjunctiva normal bilaterally HENT: Normocephalic, atraumatic,mucus membranes moist.  Positive nasal congestion.  Erythematous, swollen turbinates.  No maxillary, frontal sinus tenderness.  Tonsils surgically absent.  No obvious postnasal drip. Respiratory: Normal inspiratory effort lungs clear bilateral Cardiovascular: Normal rate regular rhythm no murmurs rubs or gallop GI: nondistended skin: No rash, skin intact Musculoskeletal: no  deformities Neurologic: Alert & oriented x 3, no focal neuro deficits Psychiatric: Speech and behavior appropriate   ED Course   Medications - No data to display  Orders Placed This Encounter  Procedures  . COVID-19 Nasopharyngeal ([facility])    Standing Status:   Standing    Number of Occurrences:   1    No results found for this or any previous visit (from the past 24 hour(s)). No results found.  ED Clinical Impression  1. Viral URI with cough   2. Cough  3. Encounter for laboratory testing for COVID-19 virus      ED Assessment/Plan  Will check COVID.  He will be a monoclonal body infusion candidate based on history of hypertension, age, BMI.  We will have him continue his his prescription nasal spray, saline nasal irrigation, Mucinex, Tessalon.  Discussed indications for antibiotics-and that there are no clear indications for antibiotics today.  May follow-up with his doctor or return here as needed  Discussed labs, imaging, MDM, treatment plan, and plan for follow-up with patient. Discussed sn/sx that should prompt return to the ED. patient agrees with plan.   Discuss Meds ordered this encounter  Medications  . benzonatate (TESSALON) 200 MG capsule    Sig: Take 1 capsule (200 mg total) by mouth 3 (three) times daily as needed for cough.    Dispense:  30 capsule    Refill:  0    *This clinic note was created using [employer]. Therefore, there may be occasional mistakes despite careful proofreading.   ?    Melynda Ripple, MD 07/14/20 1225

## 2020-07-13 NOTE — ED Triage Notes (Signed)
Patient presents to Urgent Care with complaints of mildly productive cough and congestion since three days ago. Patient reports he has been trying theraflu and a decongestant, minimal relief.

## 2020-07-13 NOTE — Discharge Instructions (Addendum)
Quarantine until you get your COVID test back.  Continue your Flonase, start saline nasal irrigation with a Milta Deiters med rinse and distilled water as often as you want.  Try some Mucinex to help keep splint nasal congestion and cough thin and clear.  Tessalon as needed for cough

## 2020-07-14 LAB — SARS-COV-2 RNA,(COVID-19) QUALITATIVE NAAT: SARS CoV2 RNA: DETECTED — CR

## 2020-07-16 ENCOUNTER — Telehealth (HOSPITAL_COMMUNITY): Payer: Self-pay | Admitting: Emergency Medicine

## 2020-07-16 DIAGNOSIS — U071 COVID-19: Secondary | ICD-10-CM

## 2020-07-16 NOTE — Telephone Encounter (Signed)
Patient COVID positive sent in referral to [facility] AM

## 2020-07-16 NOTE — Telephone Encounter (Signed)
-----   Message from Melynda Ripple, MD sent at 07/13/2020  8:45 AM EST ----- Regarding: COVID hypertension age BMI

## 2020-08-22 DIAGNOSIS — H2513 Age-related nuclear cataract, bilateral: Secondary | ICD-10-CM | POA: Diagnosis not present

## 2020-09-03 ENCOUNTER — Ambulatory Visit (INDEPENDENT_AMBULATORY_CARE_PROVIDER_SITE_OTHER): Payer: Medicare Other | Admitting: Orthopedic Surgery

## 2020-09-03 ENCOUNTER — Encounter: Payer: Self-pay | Admitting: Orthopedic Surgery

## 2020-09-03 ENCOUNTER — Other Ambulatory Visit: Payer: Self-pay | Admitting: Surgical

## 2020-09-03 DIAGNOSIS — M19012 Primary osteoarthritis, left shoulder: Secondary | ICD-10-CM | POA: Diagnosis not present

## 2020-09-03 MED ORDER — CELECOXIB 100 MG PO CAPS: 100.0000 mg | ORAL_CAPSULE | Freq: Two times a day (BID) | ORAL | 0 refills | Status: DC

## 2020-09-03 MED FILL — CELECOXIB 100 MG CAPS: 100 | 30 days supply | Qty: 60 | Fill #0

## 2020-09-03 NOTE — Progress Notes (Signed)
Office Visit Note   Patient: [name]           Date of Birth: [birth year]           MRN: [id number] Visit Date: 09/03/2020 Requested by: Mosie Lukes, MD [address] [place],  [place] [id number] PCP: Mosie Lukes, MD  Subjective: Chief Complaint  Patient presents with  . Left Shoulder - Pain    HPI: [name] is a 75 y.o. male who presents to the office complaining of left shoulder pain.  Patient has history of left shoulder osteoarthritis.  He had left shoulder injection on 06/04/2020 that provided no relief even for a few hours despite the previous injections provided at least 12 hours of relief.  Localizes the majority of his pain to the lateral aspect of the left shoulder with radiation primarily down to the left elbow.  He does have occasional numbness and tingling that extends into his index, middle, ring fingers of the left hand.  He wakes with pain every night due to his left shoulder.  Denies any neck pain.  He takes ibuprofen as needed which helps for about 1 hour.  Denies any history of prior shoulder surgery or neck surgery.  Endorses history of hypertension.  Denies any cardiac history, pulmonary history, kidney history.  Does not take any blood thinners..                ROS: All systems reviewed are negative as they relate to the chief complaint within the history of present illness.  Patient denies fevers or chills.  Assessment & Plan: Visit Diagnoses:  1. Arthritis of left shoulder region     Plan: Patient is a 75 year old male who presents complaining of left shoulder pain.  He has history of left shoulder osteoarthritis.  Last injection did not provide any lasting relief.  He wakes with pain every night and feels that his pain is worsening.  Ibuprofen only helps for about 1 hour.  He is here today to discuss shoulder surgery.  Discussed reverse versus anatomic shoulder replacement.  He has excellent cuff strength so anticipate anatomic shoulder  replacement.  Discussed the risks and benefits of the procedure including nerve and vessel damage, shoulder stiffness, shoulder instability, prosthetic joint infection, other medical risks such as PE or MI.  After lengthy discussion, patient wishes to proceed with surgery.  Plan to obtain thin cut CT scan of the left shoulder for further evaluation and preoperative planning.  We will post patient for surgery after CT scan.  Prescribe Celebrex for symptomatic relief in the meantime.  Patient agreed with plan.  Follow-up after procedure.  Risk and benefits of shoulder replacement are discussed with the patient including not limited to infection nerve vessel damage dislocation potential need for revision surgery as well as incomplete pain relief and incomplete restoration of function.  The extensive nature of the rehabilitative effort is also discussed.  All questions answered  Follow-Up Instructions: No follow-ups on file.   Orders:  No orders of the defined types were placed in this encounter.  Meds ordered this encounter  Medications  . celecoxib (CELEBREX) 100 MG capsule    Sig: Take 1 capsule (100 mg total) by mouth 2 (two) times daily.    Dispense:  60 capsule    Refill:  0      Procedures: No procedures performed   Clinical Data: No additional findings.  Objective: Vital Signs: There were no vitals taken for  this visit.  Physical Exam:  Constitutional: Patient appears well-developed HEENT:  Head: Normocephalic Eyes:EOM are normal Neck: Normal range of motion Cardiovascular: Normal rate Pulmonary/chest: Effort normal Neurologic: Patient is alert Skin: Skin is warm Psychiatric: Patient has normal mood and affect  Ortho Exam: Ortho exam demonstrates left shoulder with 25 degrees external rotation, 70 degrees abduction, 125 degrees forward flexion.  Crepitus with passive range of motion of the left shoulder consistent with osteoarthritis.  No crepitus concerning for rotator  cuff pathology.  Excellent strength of supraspinatus, infraspinatus, subscapularis.  Axillary nerve intact with deltoid firing.  5/5 motor strength of bilateral grip strength, finger abduction, finger adduction, pronation/supination, bicep, tricep, deltoid.  No tenderness throughout the axial cervical spine.  Negative Spurling sign.  Specialty Comments:  No specialty comments available.  Imaging: No results found.   PMFS History: Patient Active Problem List   Diagnosis Date Noted  . Skin tags, multiple acquired 07/07/2019  . LUQ pain 07/07/2019  . Primary osteoarthritis of right knee 03/16/2019  . Ventral hernia s/p lap repair with mesh 06/03/2018 06/03/2018  . Hemorrhoids 08/22/2016  . Anemia 03/16/2016  . Right shoulder pain 02/26/2016  . Abnormal CT scan, gastrointestinal tract suspect angioedema   . Abdominal pain   . Hyperglycemia 08/21/2013  . Low back pain 10/10/2012  . Hyperlipidemia   . Hypertension, essential    . History of colon polyps   . Obesity (BMI 30.0-34.9)    Past Medical History:  Diagnosis Date  . Abnormal CT scan, gastrointestinal tract suspect angioedema   . Anemia 03/16/2016   IRON  . Anxiety   . Arthritis   . Back pain 10/10/2012  . Fatty liver 2005   seen on 2005 ultrasound,05/2015 CT.   Marland Kitchen Hemorrhoids 08/22/2016  . Hemorrhoids, internal, with bleeding 2005   internal and external. 2005 band ligation (dr Ferdinand Lango in [facility])  . Hyperglycemia 08/21/2013  . Hyperlipidemia   . Hypertension   . Nocturia 08/21/2013  . Obesity (BMI 30.0-34.9)   . Osteoporosis   . Pneumonia    as a child  . Rectal bleeding 07/30/2014  . Renal cyst   . SBO (small bowel obstruction) ([place])   . 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 Unspecified constipation 02/06/2014  . Vitamin D deficiency     Family History  Problem Relation Age of Onset  . Stroke Mother   . Aneurysm Mother        brain  . Arthritis Father   . Liver cancer Father        liver,  atomic testing in [employer]  . Gallbladder disease Father   . Hyperlipidemia Sister   . Hypertension Sister   . Obesity Sister   . Diabetes Sister   . Diabetes Brother   . Hyperlipidemia Sister   . Hypertension Sister   . Obesity Sister   . Diabetes Sister   . Aneurysm Brother        brain  . COPD Brother   . Gallbladder disease Sister        x4  . Colon cancer Neg Hx   . Colon polyps Neg Hx   . Esophageal cancer Neg Hx   . Rectal cancer Neg Hx   . Stomach cancer Neg Hx     Past Surgical History:  Procedure Laterality Date  . COLONOSCOPY  before 2005, 2012, 2013, 08/2014.   Marland Kitchen ENTEROSCOPY N/A 10/05/2017   Procedure: ENTEROSCOPY;  Surgeon: Gatha Mayer, MD;  [facility];  Service: Endoscopy;  Laterality: N/A;  . FRACTURE SURGERY Left 1989   leg  . HEMORRHOID BANDING  2005  . KNEE SURGERY Left 2010   arthroscopy, torn carilage  . KNEE SURGERY Right 2012   arthroscopy  . TONSILLECTOMY  1957  . VENTRAL HERNIA REPAIR N/A 06/03/2018   Procedure: LAPAROSCOPIC VENTRAL WALL  HERNIA  REPAIR WITH MESH  ERAS PATHWAY;  Surgeon: Michael Boston, MD;  Location: [facility];  Service: General;  Laterality: N/A;   Social History   Occupational History  . Occupation: Retired    Fish farm manager: [employer]  Tobacco Use  . Smoking status: Never Smoker  . Smokeless tobacco: Never Used  Vaping Use  . Vaping Use: Never used  Substance and Sexual Activity  . Alcohol use: Yes    Alcohol/week: 2.0 standard drinks    Types: 2 Standard drinks or equivalent per week    Comment: Occassionally  . Drug use: No  . Sexual activity: Yes

## 2020-09-12 ENCOUNTER — Other Ambulatory Visit: Payer: Self-pay

## 2020-09-12 DIAGNOSIS — M19012 Primary osteoarthritis, left shoulder: Secondary | ICD-10-CM

## 2020-09-13 ENCOUNTER — Other Ambulatory Visit: Payer: Self-pay

## 2020-09-13 ENCOUNTER — Ambulatory Visit
Admission: RE | Admit: 2020-09-13 | Discharge: 2020-09-13 | Disposition: A | Discharge status: Home / Self Care | Payer: Medicare Other | Source: Ambulatory Visit | Attending: Orthopedic Surgery | Admitting: Orthopedic Surgery

## 2020-09-13 DIAGNOSIS — M19012 Primary osteoarthritis, left shoulder: Secondary | ICD-10-CM

## 2020-09-17 NOTE — Progress Notes (Signed)
Sheet done please call thanks

## 2020-09-20 ENCOUNTER — Other Ambulatory Visit: Payer: Self-pay | Admitting: Family Medicine

## 2020-09-21 MED FILL — HYDROCORTISONE ACETATE 25 M: 25 | 6 days supply | Qty: 12 | Fill #0

## 2020-09-24 ENCOUNTER — Other Ambulatory Visit: Payer: Self-pay | Admitting: Family Medicine

## 2020-09-24 NOTE — Telephone Encounter (Signed)
Patient has not been seen since 07/2019.  Patient wanted a CPE with another provider.  He requested Dr. Larose Kells.  Appt made for 10/26/20.  I did offer a f/u with Charlett Blake then CPE but pt declined.

## 2020-10-02 ENCOUNTER — Other Ambulatory Visit: Payer: Self-pay

## 2020-10-23 NOTE — Pre-Procedure Instructions (Signed)
Surgical Instructions    Your procedure is scheduled on Thursday May 5th  Report to [facility] Main Entrance "A" at 1015 A.M., then check in with the Admitting office.  Call this number if you have problems the morning of surgery:  [phone]   If you have any questions prior to your surgery date call [phone]: Open Monday-Friday 8am-4pm    Remember:  Do not eat after midnight the night before your surgery  You may drink clear liquids until 0915 A.M. the morning of your surgery.   Clear liquids allowed are: Water, Non-Citrus Juices (without pulp), Carbonated Beverages, Clear Tea, Black Coffee Only, and Gatorade  Patient Instructions  . The night before surgery:  o No food after midnight. ONLY clear liquids after midnight  . The day of surgery (if you do NOT have diabetes):  o Drink ONE (1) Pre-Surgery Clear Ensure by 0915 A.M. the morning of surgery. Drink in one sitting. Do not sip.  o This drink was given to you during your hospital  pre-op appointment visit.  o Nothing else to drink after completing the  Pre-Surgery Clear Ensure.         If you have questions, please contact your surgeon's office.     Take these medicines the morning of surgery with A SIP OF WATER   amLODipine (NORVASC)   metoprolol succinate (TOPROL-XL  Pitavastatin Calcium (LIVALO)  fluticasone (FLONASE)- if needed.    As of today, STOP taking any Aspirin (unless otherwise instructed by your surgeon) celecoxib (CELEBREX), Aleve, Naproxen, Ibuprofen, Motrin, Advil, Goody's, BC's, all herbal medications, fish oil, and all vitamins.                     Do NOT Smoke (Tobacco/Vaping) or drink Alcohol 24 hours prior to your procedure.  If you use a CPAP at night, you may bring all equipment for your overnight stay.   Contacts, glasses, piercing's, hearing aid's, dentures or partials may not be worn into surgery, please bring cases for these belongings.    For patients admitted to the hospital,  discharge time will be determined by your treatment team.   Patients discharged the day of surgery will not be allowed to drive home, and someone needs to stay with them for 24 hours.                                   [place]- Preparing for Total Shoulder Arthroplasty   Before surgery, you can play an important role. Because skin is not sterile, your skin needs to be as free of germs as possible. You can reduce the number of germs on your skin by using the following products. . Benzoyl Peroxide Gel o Reduces the number of germs present on the skin o Applied twice a day to shoulder area starting two days before surgery   . Chlorhexidine Gluconate (CHG) Soap o An antiseptic cleaner that kills germs and bonds with the skin to continue killing germs even after washing o Used for showering the night before surgery and morning of surgery   Oral Hygiene is also important to reduce your risk of infection.                                    Remember - BRUSH YOUR TEETH THE MORNING OF SURGERY WITH YOUR REGULAR  TOOTHPASTE  ==================================================================  Please follow these instructions carefully:  BENZOYL PEROXIDE 5% GEL  Please do not use if you have an allergy to benzoyl peroxide.   If your skin becomes reddened/irritated stop using the benzoyl peroxide.  Starting two days before surgery, apply as follows: 1. Apply benzoyl peroxide in the morning and at night. Apply after taking a shower. If you are not taking a shower clean entire shoulder front, back, and side along with the armpit with a clean wet washcloth.  2. Place a quarter-sized dollop on your shoulder and rub in thoroughly, making sure to cover the front, back, and side of your shoulder, along with the armpit.   2 days before ____ AM   ____ PM              1 day before ____ AM   ____ PM                            3.  Do this twice a day for two days.  (Last application is the night before  surgery, AFTER using the CHG soap as described below).  4. Do NOT apply benzoyl peroxide gel on the day of surgery.  CHLORHEXIDINE GLUCONATE (CHG) SOAP  Please do not use if you have an allergy to CHG or antibacterial soaps. If your skin becomes reddened/irritated stop using the CHG.   Do not shave (including legs and underarms) for at least 48 hours prior to first CHG shower. It is OK to shave your face.  Starting the night before surgery, use CHG soap as follows:  1. Shower the NIGHT BEFORE SURGERY and MORNING OF SURGERY with CHG.  2. If you choose to wash your hair, wash your hair first as usual with your normal shampoo.  3. After shampooing, rinse your hair and body thoroughly to remove the shampoo.  4. Use CHG as you would any other liquid soap.  You can apply CHG directly to the skin and wash gently with a scrungie or a clean washcloth.  5. Apply the CHG soap to your body ONLY FROM THE NECK DOWN.  Do not use on open wounds or open sores.  Avoid contact with your eyes, ears, mouth, and genitals (private parts).  Wash face and genitals (private parts) with your normal soap.  6. Wash thoroughly, paying special attention to the area where your surgery will be performed.  7. Thoroughly rinse your body with warm water from the neck down.  8. DO NOT shower/wash with your normal soap after using and rinsing off the CHG soap.   9. Pat yourself dry with a CLEAN TOWEL.   10.  Apply benzoyl peroxide.   11. Wear CLEAN PAJAMAS to bed the night before surgery; wear comfortable clothes the morning of surgery.  12. Place CLEAN SHEETS on your bed the night of your first shower and DO NOT SLEEP WITH PETS.  Day of Surgery: Shower as above Do not apply any deodorants/lotions.  Please wear clean clothes to the hospital/surgery center.   Remember to brush your teeth WITH YOUR REGULAR TOOTHPASTE.    Special instructions:    Day of Surgery: Shower with CHG soap. Do not wear jewelry. Do  not wear lotions, powders, colognes, or deodorant. Do not shave 48 hours prior to surgery.  Men may shave face and neck. Do not bring valuables to the hospital. [facility] is not responsible for any belongings or valuables. Wear Clean/Comfortable  clothing the morning of surgery Remember to brush your teeth WITH YOUR REGULAR TOOTHPASTE.   Please read over the following fact sheets that you were given.

## 2020-10-24 ENCOUNTER — Other Ambulatory Visit: Payer: Self-pay

## 2020-10-24 ENCOUNTER — Encounter (HOSPITAL_COMMUNITY): Payer: Self-pay

## 2020-10-24 ENCOUNTER — Encounter (HOSPITAL_COMMUNITY)
Admission: RE | Admit: 2020-10-24 | Discharge: 2020-10-24 | Disposition: A | Discharge status: Home / Self Care | Payer: Medicare Other | Source: Ambulatory Visit | Attending: Orthopedic Surgery | Admitting: Orthopedic Surgery

## 2020-10-24 DIAGNOSIS — Z01818 Encounter for other preprocedural examination: Secondary | ICD-10-CM | POA: Insufficient documentation

## 2020-10-24 LAB — CBC
HCT: 36.9 % — ABNORMAL LOW (ref 39.0–52.0)
Hemoglobin: 11.9 g/dL — ABNORMAL LOW (ref 13.0–17.0)
MCH: 28.1 pg (ref 26.0–34.0)
MCHC: 32.2 g/dL (ref 30.0–36.0)
MCV: 87 fL (ref 80.0–100.0)
Platelets: 246 10*3/uL (ref 150–400)
RBC: 4.24 MIL/uL (ref 4.22–5.81)
RDW: 13.1 % (ref 11.5–15.5)
WBC: 6.3 10*3/uL (ref 4.0–10.5)
nRBC: 0 % (ref 0.0–0.2)

## 2020-10-24 LAB — URINALYSIS, ROUTINE W REFLEX MICROSCOPIC
Bilirubin Urine: NEGATIVE
Glucose, UA: NEGATIVE mg/dL
Hgb urine dipstick: NEGATIVE
Ketones, ur: NEGATIVE mg/dL
Leukocytes,Ua: NEGATIVE
Nitrite: NEGATIVE
Protein, ur: NEGATIVE mg/dL
Specific Gravity, Urine: 1.013 (ref 1.005–1.030)
pH: 6 (ref 5.0–8.0)

## 2020-10-24 LAB — COMPREHENSIVE METABOLIC PANEL
ALT: 33 U/L (ref 0–44)
AST: 35 U/L (ref 15–41)
Albumin: 3.9 g/dL (ref 3.5–5.0)
Alkaline Phosphatase: 72 U/L (ref 38–126)
Anion gap: 8 (ref 5–15)
BUN: 16 mg/dL (ref 8–23)
CO2: 28 mmol/L (ref 22–32)
Calcium: 9.6 mg/dL (ref 8.9–10.3)
Chloride: 105 mmol/L (ref 98–111)
Creatinine, Ser: 1.2 mg/dL (ref 0.61–1.24)
GFR, Estimated: >60 mL/min (ref >60)
Glucose, Bld: 116 mg/dL — ABNORMAL HIGH (ref 70–99)
Potassium: 4.1 mmol/L (ref 3.5–5.1)
Sodium: 141 mmol/L (ref 135–145)
Total Bilirubin: 0.6 mg/dL (ref 0.3–1.2)
Total Protein: 7 g/dL (ref 6.5–8.1)

## 2020-10-24 LAB — SURGICAL PCR SCREEN
MRSA, PCR: NEGATIVE
Staphylococcus aureus: NEGATIVE

## 2020-10-24 NOTE — Progress Notes (Signed)
PCP - Penni Homans, MD Cardiologist - Denies  PPM/ICD - Denies  Chest x-ray - N/A EKG - 10/24/20 Stress Test - Per pt, done > 10 years ago as part of [place] fit testing; results negative ECHO - Stress Test - Per pt, done > 10 years ago as part of [place] fit testing; results negative Cardiac Cath - Denies  Sleep Study - Denies  Patient denies being diabetic.  ERAS Protcol - Yes PRE-SURGERY Ensure or G2- Ensure given  COVID TEST- 10/29/20   Anesthesia review: No  Patient denies shortness of breath, fever, cough and chest pain at PAT appointment   All instructions explained to the patient, with a verbal understanding of the material. Patient agrees to go over the instructions while at home for a better understanding. Patient also instructed to self quarantine after being tested for COVID-19. The opportunity to ask questions was provided.

## 2020-10-24 NOTE — Pre-Procedure Instructions (Signed)
Surgical Instructions    Your procedure is scheduled on Thursday May 5th  Report to [facility] Main Entrance "A" at 1015 A.M., then check in with the Admitting office.  Call this number if you have problems the morning of surgery:  [phone]   If you have any questions prior to your surgery date call [phone]: Open Monday-Friday 8am-4pm    Remember:  Do not eat after midnight the night before your surgery  You may drink clear liquids until 0915 A.M. the morning of your surgery.   Clear liquids allowed are: Water, Non-Citrus Juices (without pulp), Carbonated Beverages, Clear Tea, Black Coffee Only, and Gatorade  Patient Instructions  . The night before surgery:  o No food after midnight. ONLY clear liquids after midnight  . The day of surgery (if you do NOT have diabetes):  o Drink ONE (1) Pre-Surgery Clear Ensure by 0915 A.M. the morning of surgery. Drink in one sitting. Do not sip.  o This drink was given to you during your hospital  pre-op appointment visit.  o Nothing else to drink after completing the  Pre-Surgery Clear Ensure.         If you have questions, please contact your surgeon's office.     Take these medicines the morning of surgery with A SIP OF WATER   amLODipine (NORVASC)   metoprolol succinate (TOPROL-XL  Pitavastatin Calcium (LIVALO)  fluticasone (FLONASE)- if needed.    As of today, STOP taking any Aspirin (unless otherwise instructed by your surgeon) celecoxib (CELEBREX), Aleve, Naproxen, Ibuprofen, Motrin, Advil, Goody's, BC's, all herbal medications, fish oil, and all vitamins.                     Do NOT Smoke (Tobacco/Vaping) or drink Alcohol 24 hours prior to your procedure.  If you use a CPAP at night, you may bring all equipment for your overnight stay.   Contacts, glasses, piercing's, hearing aid's, dentures or partials may not be worn into surgery, please bring cases for these belongings.    For patients admitted to the hospital,  discharge time will be determined by your treatment team.   Patients discharged the day of surgery will not be allowed to drive home, and someone needs to stay with them for 24 hours.                                   [place]- Preparing for Total Shoulder Arthroplasty   Before surgery, you can play an important role. Because skin is not sterile, your skin needs to be as free of germs as possible. You can reduce the number of germs on your skin by using the following products. . Benzoyl Peroxide Gel o Reduces the number of germs present on the skin o Applied twice a day to shoulder area starting two days before surgery   . Chlorhexidine Gluconate (CHG) Soap o An antiseptic cleaner that kills germs and bonds with the skin to continue killing germs even after washing o Used for showering the night before surgery and morning of surgery   Oral Hygiene is also important to reduce your risk of infection.                                    Remember - BRUSH YOUR TEETH THE MORNING OF SURGERY WITH YOUR REGULAR  TOOTHPASTE  ==================================================================  Please follow these instructions carefully:  BENZOYL PEROXIDE 5% GEL  Please do not use if you have an allergy to benzoyl peroxide.   If your skin becomes reddened/irritated stop using the benzoyl peroxide.  Starting two days before surgery, apply as follows: 1. Apply benzoyl peroxide in the morning and at night. Apply after taking a shower. If you are not taking a shower clean entire shoulder front, back, and side along with the armpit with a clean wet washcloth.  2. Place a quarter-sized dollop on your shoulder and rub in thoroughly, making sure to cover the front, back, and side of your shoulder, along with the armpit.   2 days before ____ AM   ____ PM              1 day before ____ AM   ____ PM                            3.  Do this twice a day for two days.  (Last application is the night before  surgery, AFTER using the CHG soap as described below).  4. Do NOT apply benzoyl peroxide gel on the day of surgery.  CHLORHEXIDINE GLUCONATE (CHG) SOAP  Please do not use if you have an allergy to CHG or antibacterial soaps. If your skin becomes reddened/irritated stop using the CHG.   Do not shave (including legs and underarms) for at least 48 hours prior to first CHG shower. It is OK to shave your face.  Starting the night before surgery, use CHG soap as follows:  1. Shower the NIGHT BEFORE SURGERY and MORNING OF SURGERY with CHG.  2. If you choose to wash your hair, wash your hair first as usual with your normal shampoo.  3. After shampooing, rinse your hair and body thoroughly to remove the shampoo.  4. Use CHG as you would any other liquid soap.  You can apply CHG directly to the skin and wash gently with a scrungie or a clean washcloth.  5. Apply the CHG soap to your body ONLY FROM THE NECK DOWN.  Do not use on open wounds or open sores.  Avoid contact with your eyes, ears, mouth, and genitals (private parts).  Wash face and genitals (private parts) with your normal soap.  6. Wash thoroughly, paying special attention to the area where your surgery will be performed.  7. Thoroughly rinse your body with warm water from the neck down.  8. DO NOT shower/wash with your normal soap after using and rinsing off the CHG soap.   9. Pat yourself dry with a CLEAN TOWEL.   10.  Apply benzoyl peroxide.   11. Wear CLEAN PAJAMAS to bed the night before surgery; wear comfortable clothes the morning of surgery.  12. Place CLEAN SHEETS on your bed the night of your first shower and DO NOT SLEEP WITH PETS.   Special instructions:    Day of Surgery: Shower with CHG soap. Do not wear jewelry. Do not wear lotions, powders, colognes, or deodorant. Do not shave 48 hours prior to surgery.  Men may shave face and neck. Do not bring valuables to the hospital. [facility] is not responsible  for any belongings or valuables. Wear Clean/Comfortable clothing the morning of surgery Remember to brush your teeth WITH YOUR REGULAR TOOTHPASTE.   Please read over the following fact sheets that you were given.

## 2020-10-26 ENCOUNTER — Other Ambulatory Visit (HOSPITAL_BASED_OUTPATIENT_CLINIC_OR_DEPARTMENT_OTHER): Payer: Self-pay

## 2020-10-26 ENCOUNTER — Encounter: Payer: Self-pay | Admitting: Internal Medicine

## 2020-10-26 ENCOUNTER — Ambulatory Visit (INDEPENDENT_AMBULATORY_CARE_PROVIDER_SITE_OTHER): Payer: Medicare Other | Admitting: Internal Medicine

## 2020-10-26 ENCOUNTER — Other Ambulatory Visit: Payer: Self-pay

## 2020-10-26 VITALS — BP 128/88 | HR 88 | Temp 98.3°F | Resp 16 | Ht 70.0 in | Wt 215.4 lb

## 2020-10-26 DIAGNOSIS — E785 Hyperlipidemia, unspecified: Secondary | ICD-10-CM | POA: Diagnosis not present

## 2020-10-26 DIAGNOSIS — D649 Anemia, unspecified: Secondary | ICD-10-CM

## 2020-10-26 DIAGNOSIS — D509 Iron deficiency anemia, unspecified: Secondary | ICD-10-CM | POA: Diagnosis not present

## 2020-10-26 DIAGNOSIS — R739 Hyperglycemia, unspecified: Secondary | ICD-10-CM | POA: Diagnosis not present

## 2020-10-26 DIAGNOSIS — Z0000 Encounter for general adult medical examination without abnormal findings: Secondary | ICD-10-CM | POA: Diagnosis not present

## 2020-10-26 LAB — LIPID PANEL
Cholesterol: 133 mg/dL (ref 0–200)
HDL: 44.3 mg/dL (ref >39.00)
LDL Cholesterol: 75 mg/dL (ref 0–99)
NonHDL: 88.42
Total CHOL/HDL Ratio: 3
Triglycerides: 66 mg/dL (ref 0.0–149.0)
VLDL: 13.2 mg/dL (ref 0.0–40.0)

## 2020-10-26 LAB — FERRITIN: Ferritin: 6.3 ng/mL — ABNORMAL LOW (ref 22.0–322.0)

## 2020-10-26 LAB — HEMOGLOBIN A1C: Hgb A1c MFr Bld: 5.9 % (ref 4.6–6.5)

## 2020-10-26 LAB — IRON: Iron: 43 ug/dL (ref 42–165)

## 2020-10-26 MED ORDER — ALPRAZOLAM 1 MG PO TABS
ORAL_TABLET | ORAL | 0 refills | Status: DC
  Filled 2020-10-26: qty 30, 30d supply, fill #0

## 2020-10-26 NOTE — Patient Instructions (Signed)
Please consider getting Tdap (vaccination against tetanus and a whooping cough) at your pharmacy   If you have severe bleeding: Call immediately  [facility] : Get the blood work     Mueller, Charles back for a checkup in 3 months    "Living will", "[place] of attorney": Advanced care planning  (If you already have a living will or healthcare power of attorney, please bring the copy to be scanned in your chart.)  Advance care planning is a process that supports adults in  understanding and sharing their preferences regarding future medical care.   The patient's preferences are recorded in documents called Advance Directives.    Advanced directives are completed (and can be modified at any time) while the patient is in full mental capacity.   The documentation should be available at all times to the patient, the family and the healthcare providers.  Bring in a copy to be scanned in your chart is an excellent idea and is recommended   This legal documents direct treatment decision making and/or appoint a surrogate to make the decision if the patient is not capable to do so.    Advance directives can be documented in many types of formats,  documents have names such as:  Lliving will  Durable power of attorney for healthcare (healthcare proxy or healthcare power of attorney)  Combined directives  Physician orders for life-sustaining treatment    More information at:  [email or web address]

## 2020-10-26 NOTE — Progress Notes (Signed)
Subjective:    Patient ID: [name], male    DOB: May 04, 1946, 75 y.o.   MRN: [id number]  DOS:  10/26/2020 Type of visit - description: Here for CPX  Patient of Dr. Randel Pigg, request a CPX. Patient will have shoulder replacement surgery soon. He still has GI symptoms: Occasional upper abdominal cramps for which he takes hyoscyamine  Lately, has developed again a hemorrhoidal bleed, sometimes he sees 1 teaspoon of red blood or even more w/ BMs. Request a refill on Xanax   Review of Systems  Other than above, a 14 point review of systems is negative      Past Medical History:  Diagnosis Date  . Abnormal CT scan, gastrointestinal tract suspect angioedema   . Anemia 03/16/2016   IRON  . Anxiety   . Arthritis   . Back pain 10/10/2012  . Fatty liver 2005   seen on 2005 ultrasound,05/2015 CT.   Marland Kitchen Hemorrhoids 08/22/2016  . Hemorrhoids, internal, with bleeding 2005   internal and external. 2005 band ligation (dr Ferdinand Lango in [facility])  . Hyperglycemia 08/21/2013  . Hyperlipidemia   . Hypertension   . Nocturia 08/21/2013  . Obesity (BMI 30.0-34.9)   . Osteoporosis   . Pneumonia    as a child  . Rectal bleeding 07/30/2014  . Renal cyst   . SBO (small bowel obstruction) ([place])   . 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 Unspecified constipation 02/06/2014  . Vitamin D deficiency     Past Surgical History:  Procedure Laterality Date  . COLONOSCOPY  before 2005, 2012, 2013, 08/2014.   Marland Kitchen ENTEROSCOPY N/A 10/05/2017   Procedure: ENTEROSCOPY;  Surgeon: Gatha Mayer, MD;  Location: [facility];  Service: Endoscopy;  Laterality: N/A;  . FRACTURE SURGERY Left 1989   leg  . HEMORRHOID BANDING  2005  . KNEE SURGERY Left 2010   arthroscopy, torn carilage  . KNEE SURGERY Right 2012   arthroscopy  . TONSILLECTOMY  1957  . TONSILLECTOMY    . VENTRAL HERNIA REPAIR N/A 06/03/2018   Procedure: LAPAROSCOPIC VENTRAL WALL  HERNIA  REPAIR WITH MESH  ERAS PATHWAY;  Surgeon:  Michael Boston, MD;  Location: [facility];  Service: General;  Laterality: N/A;   Social History   Socioeconomic History  . Marital status: Married    Spouse name: Not on file  . Number of children: 1  . Years of education: Not on file  . Highest education level: Not on file  Occupational History  . Occupation: Retired    Fish farm manager: [employer]  Tobacco Use  . Smoking status: Never Smoker  . Smokeless tobacco: Never Used  Vaping Use  . Vaping Use: Never used  Substance and Sexual Activity  . Alcohol use: Yes    Alcohol/week: 2.0 standard drinks    Types: 2 Standard drinks or equivalent per week    Comment: Occassionally  . Drug use: No  . Sexual activity: Yes  Other Topics Concern  . Not on file  Social History Narrative   Married - 1 child   Retired from [employer], no tbacco/drug use   Social Determinants of Health   Financial Resource Strain: Not on file  Food Insecurity: Not on file  Transportation Needs: Not on file  Physical Activity: Not on file  Stress: Not on file  Social Connections: Not on file  Intimate Partner Violence: Not on file    Allergies as of 10/26/2020   No Known  Allergies     Medication List       Accurate as of October 26, 2020 11:59 PM. If you have any questions, ask your nurse or doctor.        STOP taking these medications   benzonatate 200 MG capsule Commonly known as: TESSALON Stopped by: Kathlene November, MD   Fluad Quadrivalent 0.5 ML injection Generic drug: influenza vaccine adjuvanted Stopped by: Kathlene November, MD   Pfizer-BioNTech COVID-19 Vacc 30 MCG/0.3ML injection Generic drug: COVID-19 mRNA vaccine Therapist, music) Stopped by: Kathlene November, MD     TAKE these medications   ALPRAZolam 1 MG tablet Commonly known as: XANAX TAKE 1 TABLET (1 MG TOTAL) BY MOUTH AT BEDTIME AS NEEDED FOR ANXIETY. What changed: Another medication with the same name was removed. Continue taking this medication, and follow the directions you see  here. Changed by: Kathlene November, MD   amLODipine 5 MG tablet Commonly known as: NORVASC Take 1 tablet (5 mg total) by mouth daily.   azelastine 0.05 % ophthalmic solution Commonly known as: OPTIVAR Place 1 drop into the left eye 2 (two) times daily.   celecoxib 100 MG capsule Commonly known as: CELEBREX TAKE 1 CAPSULE (100 MG TOTAL) BY MOUTH 2 (TWO) TIMES DAILY.   fluticasone 50 MCG/ACT nasal spray Commonly known as: FLONASE Place 2 sprays into both nostrils daily. What changed:   when to take this  reasons to take this   Hemmorex-HC 25 MG suppository Generic drug: hydrocortisone UNWRAP AND INSERT 1 SUPPOSITORY RECTALLY 2 TIMES DAILY AS NEEDED FOR HEMORRHOIDS OR ANAL ITCHING.   hyoscyamine 0.125 MG Tbdp disintergrating tablet Commonly known as: ANASPAZ Place 1 tablet (0.125 mg total) under the tongue every 4 (four) hours as needed for cramping.   Livalo 2 MG Tabs Generic drug: Pitavastatin Calcium Take 1 tablet (2 mg total) by mouth daily.   losartan 50 MG tablet Commonly known as: COZAAR Take 1 tablet (50 mg total) by mouth daily.   metoprolol succinate 50 MG 24 hr tablet Commonly known as: TOPROL-XL Take 1 tablet (50 mg total) by mouth daily.   MULTIVITAMIN ADULT PO Take 2 tablets by mouth daily.   polyethylene glycol 17 g packet Commonly known as: MIRALAX / GLYCOLAX Take 17 g by mouth daily as needed for moderate constipation.   Salonpas 3.07-05-08 % Ptch Generic drug: Camphor-Menthol-Methyl Sal Place 1 patch onto the skin daily as needed (pain).   sildenafil 20 MG tablet Commonly known as: REVATIO TAKE 1 TO 5 TABLETS BY MOUTH AS NEEDED 1 HOUR PRIOR TO INTERCOURSE   trolamine salicylate 10 % cream Commonly known as: ASPERCREME Apply 1 application topically as needed for muscle pain.          Objective:   Physical Exam BP 128/88 (BP Location: Left Arm, Patient Position: Sitting, Cuff Size: Normal)   Pulse 88   Temp 98.3 F (36.8 C) (Oral)   Resp 16    Ht 5\' 10"  (1.778 m)   Wt 215 lb 6 oz (97.7 kg)   SpO2 97%   BMI 30.90 kg/m  General: Well developed, NAD, BMI noted Neck: No  thyromegaly  HEENT:  Normocephalic . Face symmetric, atraumatic Lungs:  CTA B Normal respiratory effort, no intercostal retractions, no accessory muscle use. Heart: RRR,  no murmur.  Abdomen:  Not distended, soft, non-tender. No rebound or rigidity.   Lower extremities: no pretibial edema bilaterally  Skin: Exposed areas without rash. Not pale. Not jaundice Neurologic:  alert & oriented X3.  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 Strength symmetric and appropriate for age.  Psych: Cognition and judgment appear intact.  Cooperative with normal attention span and concentration.  Behavior appropriate. No anxious or depressed appearing.     Assessment    75 year old Male, PMH includes HTN, high cholesterol, hyperglycemia, presents for physical exam.  CPX: Immunizations reviewed Tdap: Recommended COVID VAX #4 discussed CCS: Last colonoscopy 2016, Dr. Carlean Purl.  Next colonoscopy 07-2021 per GI letter Prostate cancer screening:Last visit with [facility] [phone] Labs: Reviewed, will check FLP, anemia panel and, A1c. Patient education: Recommend healthy diet and exercise knowing that DJD limit his efforts.  Healthcare power of attorney discussed. HTN: Seems controlled High cholesterol: On Livalo, checking labs. Anemia: Noted on recent labs, he is UTD on colonoscopies, reports hemorrhoidal bleeding, sometimes 1 teaspoon or more.  Suspect that is the etiology of the anemia.  Rec to call if bleeding is severe, will check anemia panel. Anxiety, insomnia: Request Xanax refill, PDMP okay, sent. RTC 3 months    This visit occurred during the SARS-CoV-2 public health emergency.  Safety protocols were in place, including screening questions prior to the visit, additional usage of staff PPE, and extensive cleaning of exam room while observing  appropriate contact time as indicated for disinfecting solutions.

## 2020-10-27 ENCOUNTER — Encounter: Payer: Self-pay | Admitting: Internal Medicine

## 2020-10-28 LAB — URINE CULTURE: Culture: NO GROWTH

## 2020-10-29 ENCOUNTER — Other Ambulatory Visit (HOSPITAL_BASED_OUTPATIENT_CLINIC_OR_DEPARTMENT_OTHER): Payer: Self-pay

## 2020-10-29 ENCOUNTER — Other Ambulatory Visit (HOSPITAL_COMMUNITY)
Admission: RE | Admit: 2020-10-29 | Discharge: 2020-10-29 | Disposition: A | Discharge status: Home / Self Care | Payer: Medicare Other | Source: Ambulatory Visit | Attending: Orthopedic Surgery | Admitting: Orthopedic Surgery

## 2020-10-29 DIAGNOSIS — Z20822 Contact with and (suspected) exposure to covid-19: Secondary | ICD-10-CM | POA: Diagnosis not present

## 2020-10-29 DIAGNOSIS — Z01812 Encounter for preprocedural laboratory examination: Secondary | ICD-10-CM | POA: Diagnosis not present

## 2020-10-29 MED ORDER — FERROUS FUMARATE 325 (106 FE) MG PO TABS
1.0000 | ORAL_TABLET | Freq: Two times a day (BID) | ORAL | 3 refills | Status: DC
  Filled 2020-10-29: qty 60, 30d supply, fill #0

## 2020-10-29 NOTE — Addendum Note (Signed)
Addended byDamita Dunnings D on: 10/29/2020 10:43 AM   Modules accepted: Orders

## 2020-10-30 ENCOUNTER — Other Ambulatory Visit (HOSPITAL_BASED_OUTPATIENT_CLINIC_OR_DEPARTMENT_OTHER): Payer: Self-pay

## 2020-10-30 LAB — SARS CORONAVIRUS 2 (TAT 6-24 HRS): SARS Coronavirus 2: NEGATIVE

## 2020-11-01 ENCOUNTER — Ambulatory Visit (HOSPITAL_COMMUNITY): Payer: Medicare Other | Admitting: Certified Registered Nurse Anesthetist

## 2020-11-01 ENCOUNTER — Encounter (HOSPITAL_COMMUNITY): Payer: Self-pay | Admitting: Orthopedic Surgery

## 2020-11-01 ENCOUNTER — Observation Stay (HOSPITAL_COMMUNITY)
Admission: RE | Admit: 2020-11-01 | Discharge: 2020-11-02 | Disposition: A | Discharge status: Home / Self Care | Payer: Medicare Other | Attending: Orthopedic Surgery | Admitting: Orthopedic Surgery

## 2020-11-01 ENCOUNTER — Observation Stay (HOSPITAL_COMMUNITY): Payer: Medicare Other

## 2020-11-01 ENCOUNTER — Encounter (HOSPITAL_COMMUNITY)
Admission: RE | Disposition: A | Discharge status: Home / Self Care | Payer: Self-pay | Source: Home / Self Care | Attending: Orthopedic Surgery

## 2020-11-01 DIAGNOSIS — M19012 Primary osteoarthritis, left shoulder: Principal | ICD-10-CM | POA: Insufficient documentation

## 2020-11-01 DIAGNOSIS — M19019 Primary osteoarthritis, unspecified shoulder: Secondary | ICD-10-CM

## 2020-11-01 DIAGNOSIS — E785 Hyperlipidemia, unspecified: Secondary | ICD-10-CM | POA: Diagnosis not present

## 2020-11-01 DIAGNOSIS — Z79899 Other long term (current) drug therapy: Secondary | ICD-10-CM | POA: Diagnosis not present

## 2020-11-01 DIAGNOSIS — Z96642 Presence of left artificial hip joint: Secondary | ICD-10-CM | POA: Diagnosis not present

## 2020-11-01 DIAGNOSIS — Z471 Aftercare following joint replacement surgery: Secondary | ICD-10-CM | POA: Diagnosis not present

## 2020-11-01 DIAGNOSIS — Z9889 Other specified postprocedural states: Secondary | ICD-10-CM

## 2020-11-01 DIAGNOSIS — Z96612 Presence of left artificial shoulder joint: Secondary | ICD-10-CM | POA: Diagnosis not present

## 2020-11-01 DIAGNOSIS — G8918 Other acute postprocedural pain: Secondary | ICD-10-CM | POA: Diagnosis not present

## 2020-11-01 DIAGNOSIS — I10 Essential (primary) hypertension: Secondary | ICD-10-CM | POA: Diagnosis not present

## 2020-11-01 HISTORY — PX: TOTAL SHOULDER ARTHROPLASTY: SHX126

## 2020-11-01 SURGERY — ARTHROPLASTY, SHOULDER, TOTAL
Anesthesia: General | Site: Shoulder | Laterality: Left

## 2020-11-01 MED ORDER — LACTATED RINGERS IV SOLN: INTRAVENOUS | Status: DC

## 2020-11-01 MED ORDER — POVIDONE-IODINE 10 % EX SWAB: 2.0000 "application " | Freq: Once | CUTANEOUS | Status: DC

## 2020-11-01 MED ORDER — ASPIRIN EC 81 MG PO TBEC
81.0000 mg | DELAYED_RELEASE_TABLET | Freq: Every day | ORAL | Status: DC
  Administered 2020-11-01 – 2020-11-02 (×2): 81 mg via ORAL
  Filled 2020-11-01 (×2): qty 1

## 2020-11-01 MED ORDER — CEFAZOLIN SODIUM-DEXTROSE 2-4 GM/100ML-% IV SOLN
2.0000 g | Freq: Three times a day (TID) | INTRAVENOUS | Status: DC
  Administered 2020-11-02: 2 g via INTRAVENOUS
  Filled 2020-11-01 (×2): qty 100

## 2020-11-01 MED ORDER — FENTANYL CITRATE (PF) 100 MCG/2ML IJ SOLN: 25.0000 ug | INTRAMUSCULAR | Status: DC | PRN

## 2020-11-01 MED ORDER — ONDANSETRON HCL 4 MG PO TABS: 4.0000 mg | ORAL_TABLET | Freq: Four times a day (QID) | ORAL | Status: DC | PRN

## 2020-11-01 MED ORDER — EPHEDRINE SULFATE-NACL 50-0.9 MG/10ML-% IV SOSY
PREFILLED_SYRINGE | INTRAVENOUS | Status: DC | PRN
  Administered 2020-11-01 (×3): 10 mg via INTRAVENOUS

## 2020-11-01 MED ORDER — PHENYLEPHRINE 40 MCG/ML (10ML) SYRINGE FOR IV PUSH (FOR BLOOD PRESSURE SUPPORT)
PREFILLED_SYRINGE | INTRAVENOUS | Status: DC | PRN
  Administered 2020-11-01 (×2): 80 ug via INTRAVENOUS
  Administered 2020-11-01: 120 ug via INTRAVENOUS
  Administered 2020-11-01: 80 ug via INTRAVENOUS
  Administered 2020-11-01: 120 ug via INTRAVENOUS

## 2020-11-01 MED ORDER — PROPOFOL 10 MG/ML IV BOLUS
INTRAVENOUS | Status: DC | PRN
  Administered 2020-11-01: 150 mg via INTRAVENOUS
  Administered 2020-11-01: 30 mg via INTRAVENOUS
  Administered 2020-11-01: 50 mg via INTRAVENOUS

## 2020-11-01 MED ORDER — DOCUSATE SODIUM 100 MG PO CAPS
100.0000 mg | ORAL_CAPSULE | Freq: Two times a day (BID) | ORAL | Status: DC
  Administered 2020-11-01 – 2020-11-02 (×2): 100 mg via ORAL
  Filled 2020-11-01 (×2): qty 1

## 2020-11-01 MED ORDER — METOCLOPRAMIDE HCL 5 MG/ML IJ SOLN: 5.0000 mg | Freq: Three times a day (TID) | INTRAMUSCULAR | Status: DC | PRN

## 2020-11-01 MED ORDER — TRANEXAMIC ACID-NACL 1000-0.7 MG/100ML-% IV SOLN
1000.0000 mg | INTRAVENOUS | Status: AC
  Administered 2020-11-01: 1000 mg via INTRAVENOUS
  Filled 2020-11-01: qty 100

## 2020-11-01 MED ORDER — METHOCARBAMOL 500 MG PO TABS
500.0000 mg | ORAL_TABLET | Freq: Four times a day (QID) | ORAL | Status: DC | PRN
  Administered 2020-11-01 – 2020-11-02 (×2): 500 mg via ORAL
  Filled 2020-11-01 (×2): qty 1

## 2020-11-01 MED ORDER — VANCOMYCIN HCL 1000 MG IV SOLR
INTRAVENOUS | Status: AC
  Filled 2020-11-01: qty 1000

## 2020-11-01 MED ORDER — METOPROLOL SUCCINATE ER 50 MG PO TB24
50.0000 mg | ORAL_TABLET | Freq: Every day | ORAL | Status: DC
  Administered 2020-11-02: 50 mg via ORAL
  Filled 2020-11-01: qty 1

## 2020-11-01 MED ORDER — VANCOMYCIN HCL 1000 MG IV SOLR
INTRAVENOUS | Status: DC | PRN
  Administered 2020-11-01: 1000 mg

## 2020-11-01 MED ORDER — DEXAMETHASONE SODIUM PHOSPHATE 10 MG/ML IJ SOLN
INTRAMUSCULAR | Status: DC | PRN
  Administered 2020-11-01: 5 mg via INTRAVENOUS

## 2020-11-01 MED ORDER — NAPROXEN 250 MG PO TABS
250.0000 mg | ORAL_TABLET | Freq: Two times a day (BID) | ORAL | Status: DC
  Administered 2020-11-02: 250 mg via ORAL
  Filled 2020-11-01: qty 1

## 2020-11-01 MED ORDER — FENTANYL CITRATE (PF) 250 MCG/5ML IJ SOLN
INTRAMUSCULAR | Status: DC | PRN
  Administered 2020-11-01 (×2): 50 ug via INTRAVENOUS

## 2020-11-01 MED ORDER — CEFAZOLIN SODIUM-DEXTROSE 2-4 GM/100ML-% IV SOLN
2.0000 g | INTRAVENOUS | Status: AC
  Administered 2020-11-01 (×2): 2 g via INTRAVENOUS
  Filled 2020-11-01: qty 100

## 2020-11-01 MED ORDER — AMLODIPINE BESYLATE 5 MG PO TABS
5.0000 mg | ORAL_TABLET | Freq: Every day | ORAL | Status: DC
  Administered 2020-11-02: 5 mg via ORAL
  Filled 2020-11-01: qty 1

## 2020-11-01 MED ORDER — CHLORHEXIDINE GLUCONATE 0.12 % MT SOLN
15.0000 mL | Freq: Once | OROMUCOSAL | Status: AC
  Administered 2020-11-01: 15 mL via OROMUCOSAL
  Filled 2020-11-01: qty 15

## 2020-11-01 MED ORDER — OXYCODONE HCL 5 MG/5ML PO SOLN: 5.0000 mg | Freq: Once | ORAL | Status: DC | PRN

## 2020-11-01 MED ORDER — ONDANSETRON HCL 4 MG/2ML IJ SOLN: 4.0000 mg | Freq: Once | INTRAMUSCULAR | Status: DC | PRN

## 2020-11-01 MED ORDER — OXYCODONE HCL 5 MG PO TABS
5.0000 mg | ORAL_TABLET | ORAL | Status: DC | PRN
Start: 2020-11-01 — End: 2020-11-02
  Administered 2020-11-02 (×2): 10 mg via ORAL
  Filled 2020-11-01 (×3): qty 2

## 2020-11-01 MED ORDER — MENTHOL 3 MG MT LOZG: 1.0000 | LOZENGE | OROMUCOSAL | Status: DC | PRN

## 2020-11-01 MED ORDER — OXYCODONE HCL 5 MG PO TABS: 5.0000 mg | ORAL_TABLET | Freq: Once | ORAL | Status: DC | PRN

## 2020-11-01 MED ORDER — PHENYLEPHRINE HCL-NACL 10-0.9 MG/250ML-% IV SOLN
INTRAVENOUS | Status: DC | PRN
  Administered 2020-11-01: 30 ug/min via INTRAVENOUS

## 2020-11-01 MED ORDER — BUPIVACAINE-EPINEPHRINE 0.5% -1:200000 IJ SOLN
INTRAMUSCULAR | Status: AC
  Filled 2020-11-01: qty 1

## 2020-11-01 MED ORDER — HYDROMORPHONE HCL 1 MG/ML IJ SOLN
0.5000 mg | INTRAMUSCULAR | Status: DC | PRN
  Administered 2020-11-01 – 2020-11-02 (×4): 0.5 mg via INTRAVENOUS
  Filled 2020-11-01 (×4): qty 0.5

## 2020-11-01 MED ORDER — ACETAMINOPHEN 325 MG PO TABS: 325.0000 mg | ORAL_TABLET | Freq: Four times a day (QID) | ORAL | Status: DC | PRN

## 2020-11-01 MED ORDER — METOCLOPRAMIDE HCL 5 MG PO TABS
5.0000 mg | ORAL_TABLET | Freq: Three times a day (TID) | ORAL | Status: DC | PRN
Start: 2020-11-01 — End: 2020-11-02

## 2020-11-01 MED ORDER — HEMOSTATIC AGENTS (NO CHARGE) OPTIME
TOPICAL | Status: DC | PRN
  Administered 2020-11-01: 1 via TOPICAL

## 2020-11-01 MED ORDER — ALBUMIN HUMAN 5 % IV SOLN: INTRAVENOUS | Status: DC | PRN

## 2020-11-01 MED ORDER — ONDANSETRON HCL 4 MG/2ML IJ SOLN
INTRAMUSCULAR | Status: DC | PRN
  Administered 2020-11-01: 4 mg via INTRAVENOUS

## 2020-11-01 MED ORDER — FENTANYL CITRATE (PF) 100 MCG/2ML IJ SOLN
INTRAMUSCULAR | Status: AC
  Administered 2020-11-01: 100 ug via INTRAVENOUS
  Filled 2020-11-01: qty 2

## 2020-11-01 MED ORDER — ORAL CARE MOUTH RINSE: 15.0000 mL | Freq: Once | OROMUCOSAL | Status: AC

## 2020-11-01 MED ORDER — FENTANYL CITRATE (PF) 250 MCG/5ML IJ SOLN
INTRAMUSCULAR | Status: AC
  Filled 2020-11-01: qty 5

## 2020-11-01 MED ORDER — OXYCODONE HCL 5 MG PO TABS
5.0000 mg | ORAL_TABLET | Freq: Once | ORAL | Status: DC | PRN
Start: 2020-11-01 — End: 2020-11-01

## 2020-11-01 MED ORDER — BUPIVACAINE-EPINEPHRINE 0.5% -1:200000 IJ SOLN
INTRAMUSCULAR | Status: DC | PRN
  Administered 2020-11-01: 50 mL

## 2020-11-01 MED ORDER — LOSARTAN POTASSIUM 50 MG PO TABS
50.0000 mg | ORAL_TABLET | Freq: Every day | ORAL | Status: DC
  Administered 2020-11-02: 50 mg via ORAL
  Filled 2020-11-01: qty 1

## 2020-11-01 MED ORDER — METHOCARBAMOL 1000 MG/10ML IJ SOLN
500.0000 mg | Freq: Four times a day (QID) | INTRAVENOUS | Status: DC | PRN
  Filled 2020-11-01: qty 5

## 2020-11-01 MED ORDER — IRRISEPT - 450ML BOTTLE WITH 0.05% CHG IN STERILE WATER, USP 99.95% OPTIME
TOPICAL | Status: DC | PRN
  Administered 2020-11-01: 450 mL via TOPICAL

## 2020-11-01 MED ORDER — MIDAZOLAM HCL 2 MG/2ML IJ SOLN
INTRAMUSCULAR | Status: AC
  Administered 2020-11-01: 2 mg via INTRAVENOUS
  Filled 2020-11-01: qty 2

## 2020-11-01 MED ORDER — PROPOFOL 10 MG/ML IV BOLUS
INTRAVENOUS | Status: AC
  Filled 2020-11-01: qty 20

## 2020-11-01 MED ORDER — POVIDONE-IODINE 7.5 % EX SOLN
Freq: Once | CUTANEOUS | Status: DC
  Filled 2020-11-01: qty 118

## 2020-11-01 MED ORDER — STERILE WATER FOR IRRIGATION IR SOLN
Status: DC | PRN
  Administered 2020-11-01 (×2): 1000 mL

## 2020-11-01 MED ORDER — PHENOL 1.4 % MT LIQD: 1.0000 | OROMUCOSAL | Status: DC | PRN

## 2020-11-01 MED ORDER — 0.9 % SODIUM CHLORIDE (POUR BTL) OPTIME
TOPICAL | Status: DC | PRN
  Administered 2020-11-01 (×5): 1000 mL

## 2020-11-01 MED ORDER — ROCURONIUM BROMIDE 10 MG/ML (PF) SYRINGE
PREFILLED_SYRINGE | INTRAVENOUS | Status: DC | PRN
  Administered 2020-11-01: 50 mg via INTRAVENOUS

## 2020-11-01 MED ORDER — SUGAMMADEX SODIUM 200 MG/2ML IV SOLN
INTRAVENOUS | Status: DC | PRN
  Administered 2020-11-01: 200 mg via INTRAVENOUS

## 2020-11-01 MED ORDER — LIDOCAINE 2% (20 MG/ML) 5 ML SYRINGE
INTRAMUSCULAR | Status: DC | PRN
  Administered 2020-11-01: 40 mg via INTRAVENOUS

## 2020-11-01 MED ORDER — MIDAZOLAM HCL 2 MG/2ML IJ SOLN: 2.0000 mg | Freq: Once | INTRAMUSCULAR | Status: AC

## 2020-11-01 MED ORDER — ONDANSETRON HCL 4 MG/2ML IJ SOLN: 4.0000 mg | Freq: Four times a day (QID) | INTRAMUSCULAR | Status: DC | PRN

## 2020-11-01 MED ORDER — FENTANYL CITRATE (PF) 100 MCG/2ML IJ SOLN: 100.0000 ug | Freq: Once | INTRAMUSCULAR | Status: AC

## 2020-11-01 SURGICAL SUPPLY — 84 items
ALCOHOL 70% 16 OZ (MISCELLANEOUS) ×2 IMPLANT
BLADE SAW SGTL 13X75X1.27 (BLADE) ×2 IMPLANT
CEMENT BONE R 1X40 (Cement) ×2 IMPLANT
CHLORAPREP W/TINT 26 (MISCELLANEOUS) ×4 IMPLANT
COOLER ICEMAN CLASSIC (MISCELLANEOUS) ×2 IMPLANT
COVER SURGICAL LIGHT HANDLE (MISCELLANEOUS) ×2 IMPLANT
COVER WAND RF STERILE (DRAPES) IMPLANT
DRAPE INCISE IOBAN 66X45 STRL (DRAPES) ×2 IMPLANT
DRAPE U-SHAPE 47X51 STRL (DRAPES) ×4 IMPLANT
DRSG AQUACEL AG ADV 3.5X10 (GAUZE/BANDAGES/DRESSINGS) ×2 IMPLANT
DRSG AQUACEL AG ADV 3.5X14 (GAUZE/BANDAGES/DRESSINGS) ×2 IMPLANT
ELECT BLADE 4.0 EZ CLEAN MEGAD (MISCELLANEOUS)
ELECT REM PT RETURN 9FT ADLT (ELECTROSURGICAL) ×2
ELECTRODE BLDE 4.0 EZ CLN MEGD (MISCELLANEOUS) IMPLANT
ELECTRODE REM PT RTRN 9FT ADLT (ELECTROSURGICAL) ×1 IMPLANT
GAUZE SPONGE 4X4 12PLY STRL LF (GAUZE/BANDAGES/DRESSINGS) ×2 IMPLANT
GLENOID PEGGED STRL 4MM LRG (Peg) ×2 IMPLANT
GLENOID POST REGENREX HYBRID (Orthopedic Implant) ×2 IMPLANT
GLOVE ECLIPSE 7.0 STRL STRAW (GLOVE) ×2 IMPLANT
GLOVE ECLIPSE 8.0 STRL XLNG CF (GLOVE) ×2 IMPLANT
GLOVE SRG 8 PF TXTR STRL LF DI (GLOVE) ×1 IMPLANT
GLOVE SURG UNDER POLY LF SZ7 (GLOVE) ×2 IMPLANT
GLOVE SURG UNDER POLY LF SZ8 (GLOVE) ×1
GOWN STRL REUS W/ TWL LRG LVL3 (GOWN DISPOSABLE) ×2 IMPLANT
GOWN STRL REUS W/ TWL XL LVL3 (GOWN DISPOSABLE) ×1 IMPLANT
GOWN STRL REUS W/TWL LRG LVL3 (GOWN DISPOSABLE) ×2
GOWN STRL REUS W/TWL XL LVL3 (GOWN DISPOSABLE) ×1
GUIDE MODEL REV SHLD LT SZ2 (ORTHOPEDIC DISPOSABLE SUPPLIES) ×2 IMPLANT
HEAD HUMERAL BIPOLAR 47X24X46 (Miscellaneous) ×1 IMPLANT
HEAD HUMERAL COMP STD (Orthopedic Implant) ×1 IMPLANT
HEMOSTAT SURGICEL 2X14 (HEMOSTASIS) IMPLANT
HUMERAL HEAD BIPOLAR 47X24X46 (Miscellaneous) ×2 IMPLANT
HUMERAL HEAD COMP STD (Orthopedic Implant) ×2 IMPLANT
HYDROGEN PEROXIDE 16OZ (MISCELLANEOUS) ×2 IMPLANT
JET LAVAGE IRRISEPT WOUND (IRRIGATION / IRRIGATOR) ×2
KIT BASIN OR (CUSTOM PROCEDURE TRAY) ×2 IMPLANT
KIT TURNOVER KIT B (KITS) ×2 IMPLANT
LAVAGE JET IRRISEPT WOUND (IRRIGATION / IRRIGATOR) ×1 IMPLANT
LOOP VESSEL MAXI BLUE (MISCELLANEOUS) ×2 IMPLANT
MANIFOLD NEPTUNE II (INSTRUMENTS) ×2 IMPLANT
NDL SUT 6 .5 CRC .975X.05 MAYO (NEEDLE) ×1 IMPLANT
NEEDLE HYPO 25GX1X1/2 BEV (NEEDLE) IMPLANT
NEEDLE MAYO TAPER (NEEDLE) ×1
NOZZLE CEMENT SMALL (ORTHOPEDIC DISPOSABLE SUPPLIES) IMPLANT
NS IRRIG 1000ML POUR BTL (IV SOLUTION) ×2 IMPLANT
PACK SHOULDER (CUSTOM PROCEDURE TRAY) ×2 IMPLANT
PAD ARMBOARD 7.5X6 YLW CONV (MISCELLANEOUS) ×4 IMPLANT
PAD COLD SHLDR WRAP-ON (PAD) ×2 IMPLANT
PIN HUMERAL STMN 3.2MMX9IN (INSTRUMENTS) ×2 IMPLANT
PIN STEINMANN THREADED TIP (PIN) ×2 IMPLANT
PIN THREADED REVERSE (PIN) ×4 IMPLANT
RESTRAINT HEAD UNIVERSAL NS (MISCELLANEOUS) ×2 IMPLANT
RETRIEVER SUT HEWSON (MISCELLANEOUS) ×2 IMPLANT
SLING ARM IMMOBILIZER LRG (SOFTGOODS) ×2 IMPLANT
SOL PREP POV-IOD 4OZ 10% (MISCELLANEOUS) ×2 IMPLANT
SPONGE LAP 18X18 RF (DISPOSABLE) ×2 IMPLANT
SPONGE LAP 18X18 X RAY DECT (DISPOSABLE) ×2 IMPLANT
STEM HUMERAL STRL 14MMX140MM (Stem) ×2 IMPLANT
STRIP CLOSURE SKIN 1/2X4 (GAUZE/BANDAGES/DRESSINGS) ×4 IMPLANT
SUCTION FRAZIER HANDLE 10FR (MISCELLANEOUS) ×1
SUCTION TUBE FRAZIER 10FR DISP (MISCELLANEOUS) ×1 IMPLANT
SUT BROADBAND TAPE 2PK 1.5 (SUTURE) ×6 IMPLANT
SUT FIBERWIRE #2 38 T-5 BLUE (SUTURE)
SUT MAXBRAID (SUTURE) IMPLANT
SUT MNCRL AB 3-0 PS2 18 (SUTURE) ×2 IMPLANT
SUT MNCRL AB 3-0 PS2 27 (SUTURE) ×2 IMPLANT
SUT SILK 2 0 TIES 10X30 (SUTURE) ×2 IMPLANT
SUT VIC AB 0 CT1 27 (SUTURE) ×3
SUT VIC AB 0 CT1 27XBRD ANBCTR (SUTURE) ×3 IMPLANT
SUT VIC AB 1 CT1 27 (SUTURE) ×1
SUT VIC AB 1 CT1 27XBRD ANBCTR (SUTURE) ×1 IMPLANT
SUT VIC AB 1 CT1 36 (SUTURE) ×2 IMPLANT
SUT VIC AB 2-0 CT1 27 (SUTURE) ×2
SUT VIC AB 2-0 CT1 TAPERPNT 27 (SUTURE) ×2 IMPLANT
SUT VICRYL 0 UR6 27IN ABS (SUTURE) ×6 IMPLANT
SUTURE FIBERWR #2 38 T-5 BLUE (SUTURE) IMPLANT
SUTURE TAPE 1.3 40 TPR END (SUTURE) IMPLANT
SUTURETAPE 1.3 40 TPR END (SUTURE)
SYR CONTROL 10ML LL (SYRINGE) IMPLANT
TOWEL GREEN STERILE (TOWEL DISPOSABLE) ×2 IMPLANT
TOWEL GREEN STERILE FF (TOWEL DISPOSABLE) ×2 IMPLANT
TRAY FOL W/BAG SLVR 16FR STRL (SET/KITS/TRAYS/PACK) IMPLANT
TRAY FOLEY W/BAG SLVR 16FR LF (SET/KITS/TRAYS/PACK)
WATER STERILE IRR 1000ML POUR (IV SOLUTION) ×2 IMPLANT

## 2020-11-01 NOTE — Plan of Care (Signed)

## 2020-11-01 NOTE — Anesthesia Procedure Notes (Addendum)
Procedure Name: Intubation Date/Time: 11/01/2020 1:04 PM Performed by: Dorthea Cove, CRNA Pre-anesthesia Checklist: Patient identified, Emergency Drugs available, Suction available and Patient being monitored Patient Re-evaluated:Patient Re-evaluated prior to induction Oxygen Delivery Method: Circle system utilized Preoxygenation: Pre-oxygenation with 100% oxygen Induction Type: IV induction Ventilation: Mask ventilation without difficulty Laryngoscope Size: Mac and 4 Grade View: Grade I Tube type: Oral Tube size: 7.5 mm Number of attempts: 1 Airway Equipment and Method: Stylet and Oral airway Placement Confirmation: ETT inserted through vocal cords under direct vision,  positive ETCO2 and breath sounds checked- equal and bilateral Secured at: 22 cm Tube secured with: Tape Dental Injury: Teeth and Oropharynx as per pre-operative assessment

## 2020-11-01 NOTE — H&P (Signed)
[name] is an 75 y.o. male.   Chief Complaint: Left shoulder pain HPI:  [name] is a 75 y.o. male who presents complaining of left shoulder pain.  Patient has history of left shoulder osteoarthritis.  He had left shoulder injection on 06/04/2020 that provided no relief even for a few hours despite the previous injections provided at least 12 hours of relief.  Localizes the majority of his pain to the lateral aspect of the left shoulder with radiation primarily down to the left elbow.  He does have occasional numbness and tingling that extends into his index, middle, ring fingers of the left hand.  He wakes with pain every night due to his left shoulder.  Denies any neck pain.  He takes ibuprofen as needed which helps for about 1 hour.  Denies any history of prior shoulder surgery or neck surgery.  Endorses history of hypertension.  Denies any cardiac history, pulmonary history, kidney history.  Does not take any blood thinners..    Past Medical History:  Diagnosis Date  . Abnormal CT scan, gastrointestinal tract suspect angioedema   . Anemia 03/16/2016   IRON  . Anxiety   . Arthritis   . Back pain 10/10/2012  . Fatty liver 2005   seen on 2005 ultrasound,05/2015 CT.   Marland Kitchen Hemorrhoids 08/22/2016  . Hemorrhoids, internal, with bleeding 2005   internal and external. 2005 band ligation (dr Ferdinand Lango in [facility])  . Hyperglycemia 08/21/2013  . Hyperlipidemia   . Hypertension   . Nocturia 08/21/2013  . Obesity (BMI 30.0-34.9)   . Osteoporosis   . Pneumonia    as a child  . Rectal bleeding 07/30/2014  . Renal cyst   . SBO (small bowel obstruction) ([place])   . 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 Unspecified constipation 02/06/2014  . Vitamin D deficiency     Past Surgical History:  Procedure Laterality Date  . COLONOSCOPY  before 2005, 2012, 2013, 08/2014.   Marland Kitchen ENTEROSCOPY N/A 10/05/2017   Procedure: ENTEROSCOPY;  Surgeon: Gatha Mayer, MD;  Location: [facility];   Service: Endoscopy;  Laterality: N/A;  . FRACTURE SURGERY Left 1989   leg  . HEMORRHOID BANDING  2005  . KNEE SURGERY Left 2010   arthroscopy, torn carilage  . KNEE SURGERY Right 2012   arthroscopy  . TONSILLECTOMY  1957  . TONSILLECTOMY    . VENTRAL HERNIA REPAIR N/A 06/03/2018   Procedure: LAPAROSCOPIC VENTRAL WALL  HERNIA  REPAIR WITH MESH  ERAS PATHWAY;  Surgeon: Michael Boston, MD;  Location: [facility];  Service: General;  Laterality: N/A;    Family History  Problem Relation Age of Onset  . Stroke Mother   . Aneurysm Mother        brain  . Arthritis Father   . Liver cancer Father        liver, atomic testing in [employer]  . Gallbladder disease Father   . Hyperlipidemia Sister   . Hypertension Sister   . Obesity Sister   . Diabetes Sister   . Diabetes Brother   . Hyperlipidemia Sister   . Hypertension Sister   . Obesity Sister   . Diabetes Sister   . Aneurysm Brother        brain  . COPD Brother   . Gallbladder disease Sister        x4  . Colon cancer Neg Hx   . Colon polyps Neg Hx   . Esophageal cancer Neg Hx   .  Rectal cancer Neg Hx   . Stomach cancer Neg Hx   . Prostate cancer Neg Hx   . CAD Neg Hx    Social History:  reports that he has never smoked. He has never used smokeless tobacco. He reports current alcohol use of about 2.0 standard drinks of alcohol per week. He reports that he does not use drugs.  Allergies: No Known Allergies  Medications Prior to Admission  Medication Sig Dispense Refill  . ALPRAZolam (XANAX) 1 MG tablet TAKE 1 TABLET (1 MG TOTAL) BY MOUTH AT BEDTIME AS NEEDED FOR ANXIETY. 30 tablet 0  . amLODipine (NORVASC) 5 MG tablet Take 1 tablet (5 mg total) by mouth daily. 90 tablet 0  . Camphor-Menthol-Methyl Sal (SALONPAS) 3.07-05-08 % PTCH Place 1 patch onto the skin daily as needed (pain).    . celecoxib (CELEBREX) 100 MG capsule TAKE 1 CAPSULE (100 MG TOTAL) BY MOUTH 2 (TWO) TIMES DAILY. 60 capsule 0  . fluticasone (FLONASE) 50 MCG/ACT nasal  spray Place 2 sprays into both nostrils daily. (Patient taking differently: Place 2 sprays into both nostrils daily as needed for allergies.) 16 g 3  . hyoscyamine (ANASPAZ) 0.125 MG TBDP disintergrating tablet Place 1 tablet (0.125 mg total) under the tongue every 4 (four) hours as needed for cramping. 30 tablet 2  . losartan (COZAAR) 50 MG tablet Take 1 tablet (50 mg total) by mouth daily. 90 tablet 0  . metoprolol succinate (TOPROL-XL) 50 MG 24 hr tablet Take 1 tablet (50 mg total) by mouth daily. 90 tablet 0  . Multiple Vitamins-Minerals (MULTIVITAMIN ADULT PO) Take 2 tablets by mouth daily.    . Pitavastatin Calcium (LIVALO) 2 MG TABS Take 1 tablet (2 mg total) by mouth daily. 90 tablet 0  . polyethylene glycol (MIRALAX / GLYCOLAX) packet Take 17 g by mouth daily as needed for moderate constipation.    . trolamine salicylate (ASPERCREME) 10 % cream Apply 1 application topically as needed for muscle pain.    Marland Kitchen azelastine (OPTIVAR) 0.05 % ophthalmic solution Place 1 drop into the left eye 2 (two) times daily. (Patient not taking: No sig reported) 6 mL 3  . ferrous fumarate (HEMOCYTE - 106 MG FE) 325 (106 Fe) MG TABS tablet Take 1 tablet (106 mg of iron total) by mouth 2 (two) times daily. 60 tablet 3  . HEMMOREX-HC 25 MG suppository UNWRAP AND INSERT 1 SUPPOSITORY RECTALLY 2 TIMES DAILY AS NEEDED FOR HEMORRHOIDS OR ANAL ITCHING. 12 suppository 0  . sildenafil (REVATIO) 20 MG tablet TAKE 1 TO 5 TABLETS BY MOUTH AS NEEDED 1 HOUR PRIOR TO INTERCOURSE (Patient not taking: No sig reported) 20 tablet 3    No results found for this or any previous visit (from the past 48 hour(s)). No results found.  Review of Systems  Musculoskeletal: Positive for arthralgias.  All other systems reviewed and are negative.   Blood pressure (!) 159/94, pulse 96, temperature 98.3 F (36.8 C), temperature source Oral, resp. rate 18, height 5\' 10"  (1.778 m), weight 96.2 kg, SpO2 97 %. Physical Exam Vitals reviewed.   HENT:     Head: Normocephalic.     Nose: Nose normal.     Mouth/Throat:     Mouth: Mucous membranes are moist.  Eyes:     Pupils: Pupils are equal, round, and reactive to light.  Cardiovascular:     Rate and Rhythm: Normal rate.     Pulses: Normal pulses.  Pulmonary:     Effort: Pulmonary effort  is normal.  Abdominal:     General: Abdomen is flat.  Musculoskeletal:     Cervical back: Normal range of motion.  Skin:    General: Skin is warm.     Capillary Refill: Capillary refill takes less than 2 seconds.  Neurological:     General: No focal deficit present.     Mental Status: He is alert.  Psychiatric:        Mood and Affect: Mood normal.   Ortho exam demonstrates left shoulder with 25 degrees external rotation, 70 degrees abduction, 125 degrees forward flexion.  Crepitus with passive range of motion of the left shoulder consistent with osteoarthritis.  No crepitus concerning for rotator cuff pathology.  Excellent strength of supraspinatus, infraspinatus, subscapularis.  Axillary nerve intact with deltoid firing.  5/5 motor strength of bilateral grip strength, finger abduction, finger adduction, pronation/supination, bicep, tricep, deltoid.  No tenderness throughout the axial cervical spine.  Negative Spurling sign.   Assessment/Plan Patient is a 75 year old male who presents complaining of left shoulder pain.  He has history of left shoulder osteoarthritis.  Last injection did not provide any lasting relief.  He wakes with pain every night and feels that his pain is worsening.  Ibuprofen only helps for about 1 hour.  .  Discussed reverse versus anatomic shoulder replacement.  He has excellent cuff strength so anticipate anatomic shoulder replacement.  Discussed the risks and benefits of the procedure including nerve and vessel damage, shoulder stiffness, shoulder instability, prosthetic joint infection, other medical risks such as PE or MI.  After lengthy discussion, patient wishes to  proceed with surgery.    Preop CT scan has been performed for preop patient specific instrumentation and planning. .  Risk and benefits of shoulder replacement are discussed with the patient including not limited to infection nerve vessel damage dislocation potential need for revision surgery as well as incomplete pain relief and incomplete restoration of function.  The extensive nature of the rehabilitative effort is also discussed.  All questions answered  Follow-  Anderson Malta, MD 11/01/2020, 11:39 AM

## 2020-11-01 NOTE — Transfer of Care (Signed)
Immediate Anesthesia Transfer of Care Note  Patient: [name]  Procedure(s) Performed: LEFT ANATOMIC SHOULDER REPLACEMENT (Left Shoulder)  Patient Location: PACU  Anesthesia Type:General  Level of Consciousness: awake, alert  and oriented  Airway & Oxygen Therapy: Patient Spontanous Breathing  Post-op Assessment: Report given to RN and Post -op Vital signs reviewed and stable  Post vital signs: Reviewed and stable  Last Vitals:  Vitals Value Taken Time  BP 104/66 11/01/20 1734  Temp    Pulse 86 11/01/20 1736  Resp 20 11/01/20 1736  SpO2 97 % 11/01/20 1736  Vitals shown include unvalidated device data.  Last Pain:  Vitals:   11/01/20 1054  TempSrc:   PainSc: 5       Patients Stated Pain Goal: 3 (76/16/07 3710)  Complications: No complications documented.

## 2020-11-01 NOTE — Anesthesia Preprocedure Evaluation (Addendum)
Anesthesia Evaluation  Patient identified by MRN, date of birth, ID band Patient awake    Reviewed: Allergy & Precautions, NPO status , Patient's Chart, lab work & pertinent test results  Airway Mallampati: III  TM Distance: >3 FB Neck ROM: Full    Dental  (+) Teeth Intact, Dental Advisory Given   Pulmonary    breath sounds clear to auscultation       Cardiovascular hypertension,  Rhythm:Regular Rate:Normal     Neuro/Psych    GI/Hepatic   Endo/Other    Renal/GU      Musculoskeletal   Abdominal   Peds  Hematology   Anesthesia Other Findings   Reproductive/Obstetrics                            Anesthesia Physical Anesthesia Plan  ASA: II  Anesthesia Plan: General   Post-op Pain Management:  Regional for Post-op pain   Induction: Intravenous  PONV Risk Score and Plan:   Airway Management Planned: Oral ETT  Additional Equipment:   Intra-op Plan:   Post-operative Plan: Extubation in OR  Informed Consent: I have reviewed the patients History and Physical, chart, labs and discussed the procedure including the risks, benefits and alternatives for the proposed anesthesia with the patient or authorized representative who has indicated his/her understanding and acceptance.       Plan Discussed with: CRNA and Anesthesiologist  Anesthesia Plan Comments:         Anesthesia Quick Evaluation

## 2020-11-01 NOTE — Brief Op Note (Signed)
   11/01/2020  4:58 PM  PATIENT:  [name]  75 y.o. male  PRE-OPERATIVE DIAGNOSIS:  left shoulder osteoarthritis  POST-OPERATIVE DIAGNOSIS:  left shoulder osteoarthritis  PROCEDURE:  Procedure(s): LEFT ANATOMIC SHOULDER REPLACEMENT  SURGEON:  Surgeon(s): Meredith Pel, MD  ASSISTANT: Magnant PA  ANESTHESIA:   general  EBL: 100 ml    Total I/O In: 1450 [I.V.:1000; IV Piggyback:450] Out: 700 [Urine:600; Blood:100]  BLOOD ADMINISTERED: none  DRAINS: none   LOCAL MEDICATIONS USED: Vancomycin powder  SPECIMEN:  No Specimen  COUNTS:  YES  TOURNIQUET:  * No tourniquets in log *  DICTATION: .Other Dictation: Dictation Number [id number]  PLAN OF CARE: Admit for overnight observation  PATIENT DISPOSITION:  PACU - hemodynamically stable

## 2020-11-01 NOTE — Anesthesia Procedure Notes (Signed)
Anesthesia Regional Block: Interscalene brachial plexus block   Pre-Anesthetic Checklist: ,, timeout performed, Correct Patient, Correct Site, Correct Laterality, Correct Procedure, Correct Position, site marked, Risks and benefits discussed,  Surgical consent,  Pre-op evaluation,  At surgeon's request and post-op pain management  Laterality: Left  Prep: chloraprep       Needles:  Injection technique: Single-shot  Needle Type: Stimulator Needle - 40      Needle Gauge: 22     Additional Needles:   Procedures:, nerve stimulator,,,,,,,  Narrative:  Start time: 11/01/2020 11:40 AM End time: 11/01/2020 11:45 AM Injection made incrementally with aspirations every 5 mL.  Performed by: Personally  Anesthesiologist: Roberts Gaudy, MD  Additional Notes: 25 cc 0.5% Bupivacaine 1:200 epi 10 cc 1.3% Exparel

## 2020-11-02 ENCOUNTER — Other Ambulatory Visit (HOSPITAL_BASED_OUTPATIENT_CLINIC_OR_DEPARTMENT_OTHER): Payer: Self-pay

## 2020-11-02 DIAGNOSIS — M19012 Primary osteoarthritis, left shoulder: Secondary | ICD-10-CM | POA: Diagnosis not present

## 2020-11-02 DIAGNOSIS — I10 Essential (primary) hypertension: Secondary | ICD-10-CM | POA: Diagnosis not present

## 2020-11-02 DIAGNOSIS — Z79899 Other long term (current) drug therapy: Secondary | ICD-10-CM | POA: Diagnosis not present

## 2020-11-02 MED ORDER — NAPROXEN 500 MG PO TABS
250.0000 mg | ORAL_TABLET | Freq: Two times a day (BID) | ORAL | 0 refills | Status: DC
  Filled 2020-11-02: qty 30, 30d supply, fill #0

## 2020-11-02 MED ORDER — DOCUSATE SODIUM 100 MG PO CAPS
100.0000 mg | ORAL_CAPSULE | Freq: Two times a day (BID) | ORAL | 0 refills | Status: DC
  Filled 2020-11-02: qty 100, 50d supply, fill #0

## 2020-11-02 MED ORDER — METHOCARBAMOL 500 MG PO TABS
500.0000 mg | ORAL_TABLET | Freq: Four times a day (QID) | ORAL | 0 refills | Status: DC | PRN
  Filled 2020-11-02: qty 30, 8d supply, fill #0

## 2020-11-02 MED ORDER — ACETAMINOPHEN 325 MG PO TABS
325.0000 mg | ORAL_TABLET | Freq: Four times a day (QID) | ORAL | 0 refills | Status: DC | PRN
  Filled 2020-11-02: qty 100, 13d supply, fill #0

## 2020-11-02 MED ORDER — OXYCODONE HCL 5 MG PO TABS
5.0000 mg | ORAL_TABLET | ORAL | 0 refills | Status: DC | PRN
  Filled 2020-11-02: qty 35, 3d supply, fill #0

## 2020-11-02 NOTE — Progress Notes (Signed)
PT Cancellation Note  Patient Details Name: Creedence Kunesh MRN: [id number] DOB: [birth year]   Cancelled Treatment:    Reason Eval/Treat Not Completed: PT screened, no needs identified, will sign off.  OT screened for PT needs.  No acute PT needs identified.  OT reports good steadiness and safety awareness.  See OT evaluation for details of mobility.  PT to sign off.   Thanks,  Verdene Lennert, PT, DPT  Acute Rehabilitation [phone] pager #[phone] office       Wells Guiles B Aja Bolander 11/02/2020, 11:08 AM

## 2020-11-02 NOTE — Plan of Care (Signed)

## 2020-11-02 NOTE — Progress Notes (Signed)
Occupational Therapy Evaluation Patient Details Name: [name] MRN: [id number] DOB: Apr 02, 1946 Today's Date: 11/02/2020    History of Present Illness [name] is a 75 y.o. male who presents complaining of left shoulder pain.  Patient has history of left shoulder osteoarthritis. 5/4 LEFT ANATOMIC SHOULDER REPLACEMENT.   Clinical Impression   [name] was evaluated s/p the above shoulder surgery, his wife was present and supportive this session. [name] reports being indep PTA, lives in a two level home with his wife. Main bed and bath are on the second level, pt plans to sleep in recliner on the first level upon d/c home. PT is min A for ADLs for L side management, and min guard/supervision for functional mobility. Pt demonstrated great understanding of all exercises and sling application, and verbalized understanding of L shoulder precautions. Pt does not required continued OT services acutely, and is recommended to d/c home with wife for ADL and mobility A, and follow the surgeon's follow up care plan.    Follow Up Recommendations  Follow surgeon's recommendation for DC plan and follow-up therapies;Supervision - Intermittent    Equipment Recommendations  3 in 1 bedside commode       Precautions / Restrictions Precautions Precautions: Shoulder;Fall Type of Shoulder Precautions: L shoulder replacement Shoulder Interventions: Shoulder sling/immobilizer;At all times;Off for dressing/bathing/exercises Precaution Booklet Issued: Yes (comment) Precaution Comments: No ROM at shoulder, yes pendulumd, yes AROM of elbow wrist and hand Required Braces or Orthoses: Sling Restrictions Weight Bearing Restrictions: Yes LUE Weight Bearing: Non weight bearing      Mobility Bed Mobility Overal bed mobility: Needs Assistance Bed Mobility: Supine to Sit     Supine to sit: Supervision     General bed mobility comments: vc to maintain NWB of LUE    Transfers Overall transfer level:  Needs assistance Equipment used: None Transfers: Sit to/from Stand Sit to Stand: Supervision              Balance Overall balance assessment: Needs assistance Sitting-balance support: Feet supported Sitting balance-Leahy Scale: Good     Standing balance support: No upper extremity supported Standing balance-Leahy Scale: Fair               ADL either performed or assessed with clinical judgement   ADL Overall ADL's : Needs assistance/impaired Eating/Feeding: Independent;Sitting   Grooming: Wash/dry hands;Wash/dry face;Oral care;Applying deodorant;Brushing hair;Minimal assistance;Sitting   Upper Body Bathing: Minimal assistance;Sitting   Lower Body Bathing: Sit to/from stand   Upper Body Dressing : Minimal assistance;Sitting;Cueing for compensatory techniques   Lower Body Dressing: Minimal assistance;Sit to/from stand   Toilet Transfer: Supervision/safety;Comfort height toilet   Toileting- Clothing Manipulation and Hygiene: Supervision/safety;Sit to/from stand       Functional mobility during ADLs: Supervision/safety General ADL Comments: vc for compensatory techiques to maintain L shoulder precautions, min A for managing L side ADLs                  Pertinent Vitals/Pain Pain Assessment: Faces Faces Pain Scale: Hurts little more Pain Location: L shoulder Pain Descriptors / Indicators: Discomfort;Grimacing;Guarding Pain Intervention(s): RN gave pain meds during session     Hand Dominance Right   Extremity/Trunk Assessment Upper Extremity Assessment Upper Extremity Assessment: LUE deficits/detail LUE Deficits / Details: s/p shoulder sx LUE:  (No AROM/PROM of L shoulder, NWB) LUE Sensation: WNL LUE Coordination: WNL   Lower Extremity Assessment Lower Extremity Assessment: Overall WFL for tasks assessed   Cervical / Trunk Assessment Cervical / Trunk Assessment: Normal   Communication  Communication Communication: No difficulties   Cognition  Arousal/Alertness: Awake/alert Behavior During Therapy: WFL for tasks assessed/performed Overall Cognitive Status: Within Functional Limits for tasks assessed          General Comments  pt bandage over sx site is clean and dry, IV placed in RUE, no other apprent skin integrity issues; spent time educating pt and wife of shoudler precuations, ADLs and exercises    Exercises Exercises: Shoulder Shoulder Exercises Pendulum Exercise: PROM;Left;5 reps;Seated Elbow Flexion: AROM;10 reps;Right;Seated Elbow Extension: AROM;Left;10 reps;Seated Wrist Flexion: AROM;Right;10 reps;Seated Wrist Extension: AROM;Right;10 reps;Seated Digit Composite Flexion: AROM;Left;10 reps;Seated Composite Extension: AROM;Left;10 reps;Seated   Shoulder Instructions Shoulder Instructions Donning/doffing shirt without moving shoulder: Minimal assistance Method for sponge bathing under operated UE: Minimal assistance Donning/doffing sling/immobilizer: Maximal assistance Correct positioning of sling/immobilizer: Minimal assistance Pendulum exercises (written home exercise program): Supervision/safety ROM for elbow, wrist and digits of operated UE: Supervision/safety Sling wearing schedule (on at all times/off for ADL's): Supervision/safety Proper positioning of operated UE when showering: Supervision/safety Dressing change: Caregiver independent with task Positioning of UE while sleeping: Supervision/safety    Home Living Family/patient expects to be discharged to:: Private residence Living Arrangements: Spouse/significant other Available Help at Discharge: Family;Available 24 hours/day Type of Home: House Home Access: Stairs to enter [employer] of Steps: 2 Entrance Stairs-Rails: None Home Layout: Two level;1/2 bath on main level;Other (Comment) (plan to sleep in recliner on main level) Alternate Level Stairs-Number of Steps: flight   Bathroom Shower/Tub: Walk-in shower;Door   [employer] Toilet:  Standard Bathroom Accessibility: Yes   Home Equipment: None   Additional Comments: main bed and bath on second level      Prior Functioning/Environment Level of Independence: Independent                 OT Problem List: Decreased strength;Decreased range of motion;Decreased activity tolerance;Impaired balance (sitting and/or standing);Decreased safety awareness;Decreased knowledge of use of DME or AE;Decreased knowledge of precautions;Pain;Impaired UE functional use         OT Goals(Current goals can be found in the care plan section) Acute Rehab OT Goals Patient Stated Goal: to go home today   AM-PAC OT "6 Clicks" Daily Activity     Outcome Measure Help from another person eating meals?: None Help from another person taking care of personal grooming?: A Little Help from another person toileting, which includes using toliet, bedpan, or urinal?: A Little Help from another person bathing (including washing, rinsing, drying)?: A Little Help from another person to put on and taking off regular upper body clothing?: A Little Help from another person to put on and taking off regular lower body clothing?: A Little 6 Click Score: 19   End of Session Nurse Communication: Mobility status (d/c needs)  Activity Tolerance: Patient tolerated treatment well Patient left: in bed;with call bell/phone within reach;with family/visitor present  OT Visit Diagnosis: Muscle weakness (generalized) (M62.81);Pain Pain - Right/Left: Left Pain - part of body: Shoulder                Time: 8413-2440 OT Time Calculation (min): 39 min Charges:  OT General Charges $OT Visit: 1 Visit OT Evaluation $OT Eval Low Complexity: 1 Low OT Treatments $Self Care/Home Management : 8-22 mins $Therapeutic Exercise: 8-22 mins    Eufelia Veno A Reis Goga 11/02/2020, 11:00 AM

## 2020-11-02 NOTE — Op Note (Signed)
NAMEGIOVONI, BUNCH MEDICAL RECORD NO: [id number] ACCOUNT NO: [id number] DATE OF BIRTH: August 23, 1945 FACILITY: [facility] LOCATION: [facility] PHYSICIAN: Yetta Barre. Marlou Sa, MD  Operative Report   DATE OF PROCEDURE: 11/01/2020  PREOPERATIVE DIAGNOSIS:  Left shoulder arthritis.  POSTOPERATIVE DIAGNOSIS:  Left shoulder arthritis.  PROCEDURE:  Left anatomic shoulder replacement using Biomet components, large glenoid base with Regenerex porous titanium glenoid post, cemented pegs and micro 14 x 55 primary shoulder stem with modular head variable offset 46 x 24 mm.  SURGEON:  Yetta Barre. Marlou Sa, MD   ASSISTANT:  Annie Main.  INDICATIONS:  This is a 75 year old patient with end stage left shoulder arthritis, who presents for operative management after explanation of risks and benefits.  DESCRIPTION OF PROCEDURE:  The patient was brought to the operating room where general anesthetic was induced.  Preoperative antibiotics administered.  Timeout was called.  The patient was placed in the beach chair position with the head in neutral  position.  Left shoulder, arm and hand prescrubbed with alcohol and Betadine, allowed to air dry, prepped with DuraPrep solution and draped in a sterile manner.  Hydrogen peroxide was also utilized initially as a prescrub prior to alcohol and Betadine.   Charlie Pitter was used to cover the operative field.  Timeout was called.  A deltopectoral approach was made.  The patient's preoperative range of motion was about 25 degrees of external rotation, 70 degrees of isolated glenohumeral abduction, about 100 degrees  of isolated forward flexion.  Deltopectoral approach was made.  Cephalic vein mobilized medially, pec tendon partially released about 1.5 cm.  Biceps tendon tenodesed to the pec tendon using 0 Vicryl suture.  Biceps rotator interval was opened along the  transverse humeral ligament.  The subdeltoid and subacromial adhesions were released.  Circumflex vessels were ligated.  Axillary  nerve was identified and protected at all times during the case.  A vessel loop placed around it.  Next, the subscap was  detached from the lesser tuberosity using a 15 blade.  Completed distally using the electrocautery.  Capsule was removed from the inferior 2 cm of the proximal humeral neck around to the 7 o'clock position.  Tagging sutures were placed on the subscap.   Rotator interval was opened.  Coracohumeral ligament was released.  Next, the humeral head was dislocated.  Browne retractor placed.  Rotator cuff was excellent.  Reaming was performed up to a size 14 and then the head was cut in 30 degrees of  retroversion, 13 trial stem was placed and a cap was placed.  Next, a posterior retractor was placed, anterior retractor was placed.  The biceps tendon and labrum was removed.  Bankart lesion created from the 6 o'clock to 12 o'clock position.  Anterior  retractor was placed.  Good exposure was achieved.  The patient's specific guide was placed on the glenoid and reaming was performed to the appropriate depth.  Next, the reaming for the Regenerex post was performed.  Then the 3 pegs were drilled for the  cemented pegs.  Trial component was placed, which had excellent fit.  Next, the trial was removed and the true component was placed.  Thorough irrigation was performed with IrriSept solution after the incision as well as after the arthrotomy and at all  times during the case.  The thorough irrigation of the cement holes was performed.  Blood absorbing sponges were placed.  The component was then cemented into good position in accordance with preoperative templating.  Excellent fit was obtained.  Next,  with the stem in place reductions were performed, but the most stable reduction was with the variable offset head, 46 mm x 24.  This gave 50% posterior and inferior translation with no dislocation as well as excellent internal rotation and the patient  could put his hand to his head.  Trial components  were removed on the humeral side.  Six suture tapes were placed through the lesser tuberosity for subscap repair.  True component placed with excellent press fit obtained.  It should be noted that the  IrriSept solution was placed into the intramedullary canal along with vancomycin powder.  Then, the stem was placed.  The trial reduction was again performed with the same size head and excellent stability was achieved.  Offset was placed primarily  around the 8 o'clock position.  Next, the subscap was repaired with the arm in about 30 degrees of external rotation.  This was done with the 6 suture tapes.  A secure repair was achieved and it was stable to it did not really start getting tight until  around 60 degrees of external rotation.  Rotator interval was also then closed after placing IrriSept solution, vancomycin powder within the joint itself.  The deltopectoral interval was then closed using #1 Vicryl suture followed by interrupted inverted  0 Vicryl suture, 2-0 Vicryl suture and 3-0 Monocryl.  Steri-Strips, Aquacel dressing applied.  Immobilizer was placed.  Luke's assistance was required at all times during the case for retraction, opening, closing, mobilization of tissue.  His assistance  was a medical necessity.   SUJ D: 11/01/2020 5:06:15 pm T: 11/02/2020 5:12:00 am  JOB: [id number]/ [id number]

## 2020-11-02 NOTE — Anesthesia Postprocedure Evaluation (Signed)
Anesthesia Post Note  Patient: Namari Breton  Procedure(s) Performed: LEFT ANATOMIC SHOULDER REPLACEMENT (Left Shoulder)     Patient location during evaluation: PACU Anesthesia Type: General Level of consciousness: awake and alert Pain management: pain level controlled Vital Signs Assessment: post-procedure vital signs reviewed and stable Respiratory status: spontaneous breathing, nonlabored ventilation and respiratory function stable Cardiovascular status: blood pressure returned to baseline and stable Postop Assessment: no apparent nausea or vomiting Anesthetic complications: no   No complications documented.  Last Vitals:  Vitals:   11/01/20 1950 11/02/20 0518  BP: 122/83 111/74  Pulse: 80 77  Resp: 17 17  Temp: (!) 36.3 C 37.3 C  SpO2: 100% 100%                  Audry Pili

## 2020-11-02 NOTE — Progress Notes (Signed)
  Subjective: Patient stable.  Pain moderately well controlled.  Block has worn off.   Objective: Vital signs in last 24 hours: Temp:  [97.4 F (36.3 C)-99.2 F (37.3 C)] 99.2 F (37.3 C) (05/06 0518) Pulse Rate:  [76-96] 77 (05/06 0518) Resp:  [10-18] 17 (05/06 0518) BP: (93-159)/(65-94) 111/74 (05/06 0518) SpO2:  [96 %-100 %] 100 % (05/06 0518) Weight:  [96.2 kg] 96.2 kg (05/05 1029)  Intake/Output from previous day: 05/05 0701 - 05/06 0700 In: 2675.2 [I.V.:2225.2; IV Piggyback:450] Out: 4193 [Urine:1550; Blood:100] Intake/Output this shift: No intake/output data recorded.  Exam:  No cellulitis present Compartment soft  Labs: No results for input(s): HGB in the last 72 hours. No results for input(s): WBC, RBC, HCT, PLT in the last 72 hours. No results for input(s): NA, K, CL, CO2, BUN, CREATININE, GLUCOSE, CALCIUM in the last 72 hours. No results for input(s): LABPT, INR in the last 72 hours.  Assessment/Plan: Plan at this time is occupational therapy this morning for passive range of motion.  No external rotation beyond 30 degrees.  Discharge home today with CPM 1 hour 3 times a day for passive range of motion.  Follow-up in 2 weeks.  We will start formal physical therapy at that time.   G Scott Brendan Gadson 11/02/2020, 8:10 AM

## 2020-11-02 NOTE — Progress Notes (Signed)
Physician Discharge Summary      Patient ID: [name] MRN: [id number] DOB/AGE: [birth year] 75 y.o.  Admit date: 11/01/2020 Discharge date: 11/02/2020  Admission Diagnoses:  Active Problems:   S/P shoulder replacement, left   Discharge Diagnoses:  Same  Surgeries: Procedure(s): LEFT ANATOMIC SHOULDER REPLACEMENT on 11/01/2020   Consultants:   Discharged Condition: Stable  Hospital Course: [name] is an 75 y.o. male who was admitted 11/01/2020 with a chief complaint of left shoulder pain, and found to have a diagnosis of left shoulder arthritis.  They were brought to the operating room on 11/01/2020 and underwent the above named procedures.  Patient tolerated the procedure well and was seen by occupational therapy on postop day #1.  Plan is for passive range of motion only for the first 6 weeks.  Home shoulder CPM machine to be utilized.  Postop radiographs look good.  Follow-up in 2 weeks for clinical recheck.  Antibiotics given:  Anti-infectives (From admission, onward)   Start     Dose/Rate Route Frequency Ordered Stop   11/02/20 0600  ceFAZolin (ANCEF) IVPB 2g/100 mL premix        2 g 200 mL/hr over 30 Minutes Intravenous Every 8 hours 11/01/20 1819 11/02/20 2159   11/01/20 1605  vancomycin (VANCOCIN) powder  Status:  Discontinued          As needed 11/01/20 1605 11/01/20 1730   11/01/20 1030  ceFAZolin (ANCEF) IVPB 2g/100 mL premix        2 g 200 mL/hr over 30 Minutes Intravenous On call to O.R. 11/01/20 1023 11/01/20 1640    .  Recent vital signs:  Vitals:   11/01/20 1950 11/02/20 0518  BP: 122/83 111/74  Pulse: 80 77  Resp: 17 17  Temp: (!) 97.4 F (36.3 C) 99.2 F (37.3 C)  SpO2: 100% 100%    Recent laboratory studies:  Results for orders placed or performed during the hospital encounter of 10/29/20  SARS CORONAVIRUS 2 (TAT 6-24 HRS) Nasopharyngeal Nasopharyngeal Swab   Specimen: Nasopharyngeal Swab  Result Value Ref Range   SARS Coronavirus 2 NEGATIVE  NEGATIVE    Discharge Medications:   Allergies as of 11/02/2020   No Known Allergies     Medication List    STOP taking these medications   azelastine 0.05 % ophthalmic solution Commonly known as: OPTIVAR   celecoxib 100 MG capsule Commonly known as: CELEBREX   Hemmorex-HC 25 MG suppository Generic drug: hydrocortisone   sildenafil 20 MG tablet Commonly known as: REVATIO     TAKE these medications   acetaminophen 325 MG tablet Commonly known as: TYLENOL Take 1-2 tablets (325-650 mg total) by mouth every 6 (six) hours as needed for mild pain (pain score 1-3 or temp > 100.5).   ALPRAZolam 1 MG tablet Commonly known as: XANAX TAKE 1 TABLET (1 MG TOTAL) BY MOUTH AT BEDTIME AS NEEDED FOR ANXIETY.   amLODipine 5 MG tablet Commonly known as: NORVASC Take 1 tablet (5 mg total) by mouth daily.   docusate sodium 100 MG capsule Commonly known as: COLACE Take 1 capsule (100 mg total) by mouth 2 (two) times daily.   Ferretts 325 (106 Fe) MG Tabs tablet Generic drug: ferrous fumarate Take 1 tablet (106 mg of iron total) by mouth 2 (two) times daily.   fluticasone 50 MCG/ACT nasal spray Commonly known as: FLONASE Place 2 sprays into both nostrils daily. What changed:   when to take this  reasons to take this   hyoscyamine  0.125 MG Tbdp disintergrating tablet Commonly known as: ANASPAZ Place 1 tablet (0.125 mg total) under the tongue every 4 (four) hours as needed for cramping.   Livalo 2 MG Tabs Generic drug: Pitavastatin Calcium Take 1 tablet (2 mg total) by mouth daily.   losartan 50 MG tablet Commonly known as: COZAAR Take 1 tablet (50 mg total) by mouth daily.   methocarbamol 500 MG tablet Commonly known as: ROBAXIN Take 1 tablet (500 mg total) by mouth every 6 (six) hours as needed for muscle spasms.   metoprolol succinate 50 MG 24 hr tablet Commonly known as: TOPROL-XL Take 1 tablet (50 mg total) by mouth daily.   MULTIVITAMIN ADULT PO Take 2 tablets by  mouth daily.   naproxen 250 MG tablet Commonly known as: NAPROSYN Take 1 tablet (250 mg total) by mouth 2 (two) times daily with a meal.   oxyCODONE 5 MG immediate release tablet Commonly known as: Oxy IR/ROXICODONE Take 1-2 tablets (5-10 mg total) by mouth every 4 (four) hours as needed for moderate pain (pain score 4-6).   polyethylene glycol 17 g packet Commonly known as: MIRALAX / GLYCOLAX Take 17 g by mouth daily as needed for moderate constipation.   Salonpas 3.07-05-08 % Ptch Generic drug: Camphor-Menthol-Methyl Sal Place 1 patch onto the skin daily as needed (pain).   trolamine salicylate 10 % cream Commonly known as: ASPERCREME Apply 1 application topically as needed for muscle pain.       Diagnostic Studies: DG Shoulder Left Port  Result Date: 11/01/2020 CLINICAL DATA:  Left shoulder arthroplasty EXAM: LEFT SHOULDER COMPARISON:  01/18/2020 FINDINGS: Frontal view of the left shoulder demonstrates postsurgical changes from arthroplasty. Alignment is grossly anatomic on this single projection. No evidence of complication. IMPRESSION: 1. Left shoulder arthroplasty as above. Electronically Signed   By: Randa Ngo M.D.   On: 11/01/2020 20:00    Disposition: Discharge disposition: 01-Home or Self Care       Discharge Instructions    Call Charles / Call 911   Complete by: As directed    If you experience chest pain or shortness of breath, CALL 911 and be transported to the hospital emergency room.  If you develope a fever above 101 F, pus (white drainage) or increased drainage or redness at the wound, or calf pain, call your surgeon's office.   Constipation Prevention   Complete by: As directed    Drink plenty of fluids.  Prune juice may be helpful.  You may use a stool softener, such as Colace (over the counter) 100 mg twice a day.  Use MiraLax (over the counter) for constipation as needed.   Diet - low sodium heart healthy   Complete by: As directed    Discharge  instructions   Complete by: As directed    From Dr. Marlou Sa: #1 okay to shower dressing is waterproof #2 no lifting with left arm #3 use the shoulder motion brace 3 times a day for 1 hour minimum increasing the degrees daily #4 use the ice machine when necessary but especially after using the CPM machine.   Increase activity slowly as tolerated   Complete by: As directed    Post-operative opioid taper instructions:   Complete by: As directed    POST-OPERATIVE OPIOID TAPER INSTRUCTIONS: It is important to wean off of your opioid medication as soon as possible. If you do not need pain medication after your surgery it is ok to stop day one. Opioids include: Codeine, Hydrocodone(Norco, Vicodin), Oxycodone(Percocet, oxycontin)  and hydromorphone amongst others.  Long term and even short term use of opiods can cause: Increased pain response Dependence Constipation Depression Respiratory depression And more.  Withdrawal symptoms can include Flu like symptoms Nausea, vomiting And more Techniques to manage these symptoms Hydrate well Eat regular healthy meals Stay active Use relaxation techniques(deep breathing, meditating, yoga) Do Not substitute Alcohol to help with tapering If you have been on opioids for less than two weeks and do not have pain than it is ok to stop all together.  Plan to wean off of opioids This plan should start within one week post op of your joint replacement. Maintain the same interval or time between taking each dose and first decrease the dose.  Cut the total daily intake of opioids by one tablet each day Next start to increase the time between doses. The last dose that should be eliminated is the evening dose.            Signed: Anderson Malta 11/02/2020, 8:18 AM

## 2020-11-03 ENCOUNTER — Encounter (HOSPITAL_COMMUNITY): Payer: Self-pay | Admitting: Orthopedic Surgery

## 2020-11-05 DIAGNOSIS — M810 Age-related osteoporosis without current pathological fracture: Secondary | ICD-10-CM | POA: Diagnosis not present

## 2020-11-05 DIAGNOSIS — Z96612 Presence of left artificial shoulder joint: Secondary | ICD-10-CM | POA: Diagnosis not present

## 2020-11-05 DIAGNOSIS — I10 Essential (primary) hypertension: Secondary | ICD-10-CM | POA: Diagnosis not present

## 2020-11-05 DIAGNOSIS — F419 Anxiety disorder, unspecified: Secondary | ICD-10-CM | POA: Diagnosis not present

## 2020-11-05 DIAGNOSIS — Z471 Aftercare following joint replacement surgery: Secondary | ICD-10-CM | POA: Diagnosis not present

## 2020-11-05 DIAGNOSIS — E559 Vitamin D deficiency, unspecified: Secondary | ICD-10-CM | POA: Diagnosis not present

## 2020-11-05 DIAGNOSIS — E669 Obesity, unspecified: Secondary | ICD-10-CM | POA: Diagnosis not present

## 2020-11-05 DIAGNOSIS — K56609 Unspecified intestinal obstruction, unspecified as to partial versus complete obstruction: Secondary | ICD-10-CM | POA: Diagnosis not present

## 2020-11-05 DIAGNOSIS — D519 Vitamin B12 deficiency anemia, unspecified: Secondary | ICD-10-CM | POA: Diagnosis not present

## 2020-11-05 DIAGNOSIS — M549 Dorsalgia, unspecified: Secondary | ICD-10-CM | POA: Diagnosis not present

## 2020-11-05 DIAGNOSIS — K760 Fatty (change of) liver, not elsewhere classified: Secondary | ICD-10-CM | POA: Diagnosis not present

## 2020-11-05 DIAGNOSIS — E785 Hyperlipidemia, unspecified: Secondary | ICD-10-CM | POA: Diagnosis not present

## 2020-11-05 DIAGNOSIS — Z6830 Body mass index (BMI) 30.0-30.9, adult: Secondary | ICD-10-CM | POA: Diagnosis not present

## 2020-11-05 DIAGNOSIS — K5900 Constipation, unspecified: Secondary | ICD-10-CM | POA: Diagnosis not present

## 2020-11-05 DIAGNOSIS — F32A Depression, unspecified: Secondary | ICD-10-CM | POA: Diagnosis not present

## 2020-11-07 ENCOUNTER — Telehealth: Payer: Self-pay

## 2020-11-07 NOTE — Telephone Encounter (Signed)
Can you please help with this ?

## 2020-11-07 NOTE — Telephone Encounter (Signed)
All fixed. It was [facility] ([facility] out of the [employer] office. They saw him once on Monday and haven't called him yet. I fixed it and they are calling him now to let him know when they'll be there. He's doing really well though at home. Thanks.

## 2020-11-07 NOTE — Telephone Encounter (Signed)
Patient called regarding home health he stated nurse crystal told her she was referring patient to nurse by the name of Nicole [facility] patient wasn't to know if Dr.Dean has any information regarding in home rehab call back:[phone]

## 2020-11-08 DIAGNOSIS — F32A Depression, unspecified: Secondary | ICD-10-CM | POA: Diagnosis not present

## 2020-11-08 DIAGNOSIS — K760 Fatty (change of) liver, not elsewhere classified: Secondary | ICD-10-CM | POA: Diagnosis not present

## 2020-11-08 DIAGNOSIS — F419 Anxiety disorder, unspecified: Secondary | ICD-10-CM | POA: Diagnosis not present

## 2020-11-08 DIAGNOSIS — Z471 Aftercare following joint replacement surgery: Secondary | ICD-10-CM | POA: Diagnosis not present

## 2020-11-08 DIAGNOSIS — I10 Essential (primary) hypertension: Secondary | ICD-10-CM | POA: Diagnosis not present

## 2020-11-08 DIAGNOSIS — M549 Dorsalgia, unspecified: Secondary | ICD-10-CM | POA: Diagnosis not present

## 2020-11-09 DIAGNOSIS — F32A Depression, unspecified: Secondary | ICD-10-CM | POA: Diagnosis not present

## 2020-11-09 DIAGNOSIS — K760 Fatty (change of) liver, not elsewhere classified: Secondary | ICD-10-CM | POA: Diagnosis not present

## 2020-11-09 DIAGNOSIS — I10 Essential (primary) hypertension: Secondary | ICD-10-CM | POA: Diagnosis not present

## 2020-11-09 DIAGNOSIS — Z471 Aftercare following joint replacement surgery: Secondary | ICD-10-CM | POA: Diagnosis not present

## 2020-11-09 DIAGNOSIS — F419 Anxiety disorder, unspecified: Secondary | ICD-10-CM | POA: Diagnosis not present

## 2020-11-09 DIAGNOSIS — M549 Dorsalgia, unspecified: Secondary | ICD-10-CM | POA: Diagnosis not present

## 2020-11-10 NOTE — Discharge Summary (Signed)
Physician Discharge Summary      Patient ID: [name] MRN: [id number] DOB/AGE: 73-Jun-1947 75 y.o.  Admit date: 11/01/2020 Discharge date: 11/02/2020 Admission Diagnoses:  Active Problems:   S/P shoulder replacement, left   Discharge Diagnoses:  Same  Surgeries: Procedure(s): LEFT ANATOMIC SHOULDER REPLACEMENT on 11/01/2020   Consultants:   Discharged Condition: Stable  Hospital Course: [name] is an 75 y.o. male who was admitted 11/01/2020 with a chief complaint of shoulder pain, and found to have a diagnosis of shoulder arthritis.  They were brought to the operating room on 11/01/2020 and underwent the above named procedures.  Patient tolerated procedure well without immediate complications.  Seen by occupational therapy on postop day #1.  Plan to use CPM brace for 2 weeks postop.  Follow-up in 2 weeks for clinical recheck.  Pain medicine prescribed along with muscle laxer's.  Antibiotics given:  Anti-infectives (From admission, onward)   Start     Dose/Rate Route Frequency Ordered Stop   11/02/20 0600  ceFAZolin (ANCEF) IVPB 2g/100 mL premix  Status:  Discontinued        2 g 200 mL/hr over 30 Minutes Intravenous Every 8 hours 11/01/20 1819 11/02/20 1743   11/01/20 1605  vancomycin (VANCOCIN) powder  Status:  Discontinued          As needed 11/01/20 1605 11/01/20 1730   11/01/20 1030  ceFAZolin (ANCEF) IVPB 2g/100 mL premix        2 g 200 mL/hr over 30 Minutes Intravenous On call to O.R. 11/01/20 1023 11/01/20 1640    .  Recent vital signs:  Vitals:   11/02/20 0518 11/02/20 0852  BP: 111/74 126/85  Pulse: 77 79  Resp: 17 18  Temp: 99.2 F (37.3 C) 98 F (36.7 C)  SpO2: 100% 100%    Recent laboratory studies:  Results for orders placed or performed during the hospital encounter of 10/29/20  SARS CORONAVIRUS 2 (TAT 6-24 HRS) Nasopharyngeal Nasopharyngeal Swab   Specimen: Nasopharyngeal Swab  Result Value Ref Range   SARS Coronavirus 2 NEGATIVE NEGATIVE     Discharge Medications:   Allergies as of 11/02/2020   No Known Allergies     Medication List    STOP taking these medications   azelastine 0.05 % ophthalmic solution Commonly known as: OPTIVAR   celecoxib 100 MG capsule Commonly known as: CELEBREX   Hemmorex-HC 25 MG suppository Generic drug: hydrocortisone   sildenafil 20 MG tablet Commonly known as: REVATIO     TAKE these medications   acetaminophen 325 MG tablet Commonly known as: TYLENOL Take 1-2 tablets (325-650 mg total) by mouth every 6 (six) hours as needed for mild pain (pain score 1-3 or temp > 100.5).   ALPRAZolam 1 MG tablet Commonly known as: XANAX TAKE 1 TABLET (1 MG TOTAL) BY MOUTH AT BEDTIME AS NEEDED FOR ANXIETY.   amLODipine 5 MG tablet Commonly known as: NORVASC Take 1 tablet (5 mg total) by mouth daily.   docusate sodium 100 MG capsule Commonly known as: COLACE Take 1 capsule (100 mg total) by mouth 2 (two) times daily.   Ferretts 325 (106 Fe) MG Tabs tablet Generic drug: ferrous fumarate Take 1 tablet (106 mg of iron total) by mouth 2 (two) times daily.   fluticasone 50 MCG/ACT nasal spray Commonly known as: FLONASE Place 2 sprays into both nostrils daily. What changed:   when to take this  reasons to take this   hyoscyamine 0.125 MG Tbdp disintergrating tablet Commonly known as:  ANASPAZ Place 1 tablet (0.125 mg total) under the tongue every 4 (four) hours as needed for cramping.   Livalo 2 MG Tabs Generic drug: Pitavastatin Calcium Take 1 tablet (2 mg total) by mouth daily.   losartan 50 MG tablet Commonly known as: COZAAR Take 1 tablet (50 mg total) by mouth daily.   methocarbamol 500 MG tablet Commonly known as: ROBAXIN Take 1 tablet (500 mg total) by mouth every 6 (six) hours as needed for muscle spasms.   metoprolol succinate 50 MG 24 hr tablet Commonly known as: TOPROL-XL Take 1 tablet (50 mg total) by mouth daily.   MULTIVITAMIN ADULT PO Take 2 tablets by mouth  daily.   naproxen 500 MG tablet Commonly known as: NAPROSYN Take one-half tablet (250 mg total) by mouth 2 (two) times daily with a meal.   oxyCODONE 5 MG immediate release tablet Commonly known as: Oxy IR/ROXICODONE Take 1-2 tablets (5-10 mg total) by mouth every 4 (four) hours as needed for moderate pain (pain score 4-6).   polyethylene glycol 17 g packet Commonly known as: MIRALAX / GLYCOLAX Take 17 g by mouth daily as needed for moderate constipation.   Salonpas 3.07-05-08 % Ptch Generic drug: Camphor-Menthol-Methyl Sal Place 1 patch onto the skin daily as needed (pain).   trolamine salicylate 10 % cream Commonly known as: ASPERCREME Apply 1 application topically as needed for muscle pain.       Diagnostic Studies: DG Shoulder Left Port  Result Date: 11/01/2020 CLINICAL DATA:  Left shoulder arthroplasty EXAM: LEFT SHOULDER COMPARISON:  01/18/2020 FINDINGS: Frontal view of the left shoulder demonstrates postsurgical changes from arthroplasty. Alignment is grossly anatomic on this single projection. No evidence of complication. IMPRESSION: 1. Left shoulder arthroplasty as above. Electronically Signed   By: Randa Ngo M.D.   On: 11/01/2020 20:00    Disposition: Discharge disposition: 01-Home or Self Care       Discharge Instructions    Call MD / Call 911   Complete by: As directed    If you experience chest pain or shortness of breath, CALL 911 and be transported to the hospital emergency room.  If you develope a fever above 101 F, pus (white drainage) or increased drainage or redness at the wound, or calf pain, call your surgeon's office.   Constipation Prevention   Complete by: As directed    Drink plenty of fluids.  Prune juice may be helpful.  You may use a stool softener, such as Colace (over the counter) 100 mg twice a day.  Use MiraLax (over the counter) for constipation as needed.   Diet - low sodium heart healthy   Complete by: As directed    Discharge  instructions   Complete by: As directed    From Dr. Marlou Sa: #1 okay to shower dressing is waterproof #2 no lifting with left arm #3 use the shoulder motion brace 3 times a day for 1 hour minimum increasing the degrees daily #4 use the ice machine when necessary but especially after using the CPM machine.   Increase activity slowly as tolerated   Complete by: As directed    Post-operative opioid taper instructions:   Complete by: As directed    POST-OPERATIVE OPIOID TAPER INSTRUCTIONS: It is important to wean off of your opioid medication as soon as possible. If you do not need pain medication after your surgery it is ok to stop day one. Opioids include: Codeine, Hydrocodone(Norco, Vicodin), Oxycodone(Percocet, oxycontin) and hydromorphone amongst others.  Long term and  even short term use of opiods can cause: Increased pain response Dependence Constipation Depression Respiratory depression And more.  Withdrawal symptoms can include Flu like symptoms Nausea, vomiting And more Techniques to manage these symptoms Hydrate well Eat regular healthy meals Stay active Use relaxation techniques(deep breathing, meditating, yoga) Do Not substitute Alcohol to help with tapering If you have been on opioids for less than two weeks and do not have pain than it is ok to stop all together.  Plan to wean off of opioids This plan should start within one week post op of your joint replacement. Maintain the same interval or time between taking each dose and first decrease the dose.  Cut the total daily intake of opioids by one tablet each day Next start to increase the time between doses. The last dose that should be eliminated is the evening dose.          Follow-up Information    Marlou Sa, Tonna Corner, MD. Schedule an appointment as soon as possible for a visit in 2 week(s).   Specialty: Orthopedic Surgery Contact information: [address] [place] [address] [phone]                 Signed: Anderson Malta 11/10/2020, 12:21 PM

## 2020-11-12 DIAGNOSIS — I10 Essential (primary) hypertension: Secondary | ICD-10-CM | POA: Diagnosis not present

## 2020-11-12 DIAGNOSIS — K760 Fatty (change of) liver, not elsewhere classified: Secondary | ICD-10-CM | POA: Diagnosis not present

## 2020-11-12 DIAGNOSIS — F32A Depression, unspecified: Secondary | ICD-10-CM | POA: Diagnosis not present

## 2020-11-12 DIAGNOSIS — M549 Dorsalgia, unspecified: Secondary | ICD-10-CM | POA: Diagnosis not present

## 2020-11-12 DIAGNOSIS — Z471 Aftercare following joint replacement surgery: Secondary | ICD-10-CM | POA: Diagnosis not present

## 2020-11-12 DIAGNOSIS — F419 Anxiety disorder, unspecified: Secondary | ICD-10-CM | POA: Diagnosis not present

## 2020-11-13 DIAGNOSIS — F419 Anxiety disorder, unspecified: Secondary | ICD-10-CM | POA: Diagnosis not present

## 2020-11-13 DIAGNOSIS — K760 Fatty (change of) liver, not elsewhere classified: Secondary | ICD-10-CM | POA: Diagnosis not present

## 2020-11-13 DIAGNOSIS — Z471 Aftercare following joint replacement surgery: Secondary | ICD-10-CM | POA: Diagnosis not present

## 2020-11-13 DIAGNOSIS — M549 Dorsalgia, unspecified: Secondary | ICD-10-CM | POA: Diagnosis not present

## 2020-11-13 DIAGNOSIS — I10 Essential (primary) hypertension: Secondary | ICD-10-CM | POA: Diagnosis not present

## 2020-11-13 DIAGNOSIS — F32A Depression, unspecified: Secondary | ICD-10-CM | POA: Diagnosis not present

## 2020-11-15 ENCOUNTER — Ambulatory Visit (INDEPENDENT_AMBULATORY_CARE_PROVIDER_SITE_OTHER): Payer: Medicare Other | Admitting: Orthopedic Surgery

## 2020-11-15 ENCOUNTER — Ambulatory Visit (INDEPENDENT_AMBULATORY_CARE_PROVIDER_SITE_OTHER): Payer: Medicare Other

## 2020-11-15 ENCOUNTER — Other Ambulatory Visit: Payer: Self-pay

## 2020-11-15 DIAGNOSIS — Z96612 Presence of left artificial shoulder joint: Secondary | ICD-10-CM

## 2020-11-15 DIAGNOSIS — K760 Fatty (change of) liver, not elsewhere classified: Secondary | ICD-10-CM | POA: Diagnosis not present

## 2020-11-15 DIAGNOSIS — I10 Essential (primary) hypertension: Secondary | ICD-10-CM | POA: Diagnosis not present

## 2020-11-15 DIAGNOSIS — Z471 Aftercare following joint replacement surgery: Secondary | ICD-10-CM | POA: Diagnosis not present

## 2020-11-15 DIAGNOSIS — F419 Anxiety disorder, unspecified: Secondary | ICD-10-CM | POA: Diagnosis not present

## 2020-11-15 DIAGNOSIS — F32A Depression, unspecified: Secondary | ICD-10-CM | POA: Diagnosis not present

## 2020-11-15 DIAGNOSIS — M549 Dorsalgia, unspecified: Secondary | ICD-10-CM | POA: Diagnosis not present

## 2020-11-17 ENCOUNTER — Encounter: Payer: Self-pay | Admitting: Orthopedic Surgery

## 2020-11-17 NOTE — Progress Notes (Signed)
Post-Op Visit Note   Patient: [name]           Date of Birth: 06-Dec-1945           MRN: [id number] Visit Date: 11/15/2020 PCP: Mosie Lukes, MD   Assessment & Plan:  Chief Complaint:  Chief Complaint  Patient presents with  . Left Shoulder - Follow-up   Visit Diagnoses:  1. S/P shoulder replacement, left     Plan: Derak is a patient who is now 2 weeks out left total shoulder replacement.  CPM at 92.  On exam incision is intact.  Subscap strength is 5- out of 5 along with rotator cuff strength.  External rotation of 50 degrees of abduction is about 35 degrees with good endpoint.  Plan at this time is discontinue sling but no lifting with that left arm.  Active assisted range of motion and passive range of motion as tolerated with the left arm and outpatient therapy.  2-3 times a week for 4 weeks.  Clinical recheck in 4 weeks.  X-rays look good today.  Follow-Up Instructions: Return in about 4 weeks (around 12/13/2020).   Orders:  Orders Placed This Encounter  Procedures  . XR Shoulder Left  . Ambulatory referral to Physical Therapy   No orders of the defined types were placed in this encounter.   Imaging: No results found.  PMFS History: Patient Active Problem List   Diagnosis Date Noted  . S/P shoulder replacement, 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  . Skin tags, multiple acquired 07/07/2019  . LUQ pain 07/07/2019  . Primary osteoarthritis of right knee 03/16/2019  . Ventral hernia s/p lap repair with mesh 06/03/2018 06/03/2018  . Hemorrhoids 08/22/2016  . Anemia 03/16/2016  . Right shoulder pain 02/26/2016  . Abnormal CT scan, gastrointestinal tract suspect angioedema   . Abdominal pain   . Hyperglycemia 08/21/2013  . Low back pain 10/10/2012  . Hyperlipidemia   . Hypertension, essential    . History of colon polyps   . Obesity (BMI 30.0-34.9)    Past Medical History:  Diagnosis Date  . Abnormal CT scan, gastrointestinal tract suspect angioedema   . Anemia  03/16/2016   IRON  . Anxiety   . Arthritis   . Back pain 10/10/2012  . Fatty liver 2005   seen on 2005 ultrasound,05/2015 CT.   Marland Kitchen Hemorrhoids 08/22/2016  . Hemorrhoids, internal, with bleeding 2005   internal and external. 2005 band ligation (dr Ferdinand Lango in [facility])  . Hyperglycemia 08/21/2013  . Hyperlipidemia   . Hypertension   . Nocturia 08/21/2013  . Obesity (BMI 30.0-34.9)   . Osteoporosis   . Pneumonia    as a child  . Rectal bleeding 07/30/2014  . Renal cyst   . SBO (small bowel obstruction) ([place])   . 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 Unspecified constipation 02/06/2014  . Vitamin D deficiency     Family History  Problem Relation Age of Onset  . Stroke Mother   . Aneurysm Mother        brain  . Arthritis Father   . Liver cancer Father        liver, atomic testing in [employer]  . Gallbladder disease Father   . Hyperlipidemia Sister   . Hypertension Sister   . Obesity Sister   . Diabetes Sister   . Diabetes Brother   . Hyperlipidemia Sister   . Hypertension Sister   . Obesity Sister   . Diabetes Sister   .  Aneurysm Brother        brain  . COPD Brother   . Gallbladder disease Sister        x4  . Colon cancer Neg Hx   . Colon polyps Neg Hx   . Esophageal cancer Neg Hx   . Rectal cancer Neg Hx   . Stomach cancer Neg Hx   . Prostate cancer Neg Hx   . CAD Neg Hx     Past Surgical History:  Procedure Laterality Date  . COLONOSCOPY  before 2005, 2012, 2013, 08/2014.   Marland Kitchen ENTEROSCOPY N/A 10/05/2017   Procedure: ENTEROSCOPY;  Surgeon: Gatha Mayer, MD;  Location: [facility];  Service: Endoscopy;  Laterality: N/A;  . FRACTURE SURGERY Left 1989   leg  . HEMORRHOID BANDING  2005  . KNEE SURGERY Left 2010   arthroscopy, torn carilage  . KNEE SURGERY Right 2012   arthroscopy  . TONSILLECTOMY  1957  . TONSILLECTOMY    . TOTAL SHOULDER ARTHROPLASTY Left 11/01/2020   Procedure: LEFT ANATOMIC SHOULDER REPLACEMENT;  Surgeon: Meredith Pel,  MD;  Location: [facility];  Service: Orthopedics;  Laterality: Left;  Marland Kitchen VENTRAL HERNIA REPAIR N/A 06/03/2018   Procedure: LAPAROSCOPIC VENTRAL WALL  HERNIA  REPAIR WITH MESH  ERAS PATHWAY;  Surgeon: Michael Boston, MD;  Location: [facility];  Service: General;  Laterality: N/A;   Social History   Occupational History  . Occupation: Retired    Fish farm manager: [employer]  Tobacco Use  . Smoking status: Never Smoker  . Smokeless tobacco: Never Used  Vaping Use  . Vaping Use: Never used  Substance and Sexual Activity  . Alcohol use: Yes    Alcohol/week: 2.0 standard drinks    Types: 2 Standard drinks or equivalent per week    Comment: Occassionally  . Drug use: No  . Sexual activity: Yes

## 2020-11-19 DIAGNOSIS — F419 Anxiety disorder, unspecified: Secondary | ICD-10-CM | POA: Diagnosis not present

## 2020-11-19 DIAGNOSIS — M549 Dorsalgia, unspecified: Secondary | ICD-10-CM | POA: Diagnosis not present

## 2020-11-19 DIAGNOSIS — K760 Fatty (change of) liver, not elsewhere classified: Secondary | ICD-10-CM | POA: Diagnosis not present

## 2020-11-19 DIAGNOSIS — F32A Depression, unspecified: Secondary | ICD-10-CM | POA: Diagnosis not present

## 2020-11-19 DIAGNOSIS — Z471 Aftercare following joint replacement surgery: Secondary | ICD-10-CM | POA: Diagnosis not present

## 2020-11-19 DIAGNOSIS — I10 Essential (primary) hypertension: Secondary | ICD-10-CM | POA: Diagnosis not present

## 2020-11-21 DIAGNOSIS — M549 Dorsalgia, unspecified: Secondary | ICD-10-CM | POA: Diagnosis not present

## 2020-11-21 DIAGNOSIS — F419 Anxiety disorder, unspecified: Secondary | ICD-10-CM | POA: Diagnosis not present

## 2020-11-21 DIAGNOSIS — F32A Depression, unspecified: Secondary | ICD-10-CM | POA: Diagnosis not present

## 2020-11-21 DIAGNOSIS — K760 Fatty (change of) liver, not elsewhere classified: Secondary | ICD-10-CM | POA: Diagnosis not present

## 2020-11-21 DIAGNOSIS — I10 Essential (primary) hypertension: Secondary | ICD-10-CM | POA: Diagnosis not present

## 2020-11-21 DIAGNOSIS — Z471 Aftercare following joint replacement surgery: Secondary | ICD-10-CM | POA: Diagnosis not present

## 2020-11-23 DIAGNOSIS — F32A Depression, unspecified: Secondary | ICD-10-CM | POA: Diagnosis not present

## 2020-11-23 DIAGNOSIS — F419 Anxiety disorder, unspecified: Secondary | ICD-10-CM | POA: Diagnosis not present

## 2020-11-23 DIAGNOSIS — I10 Essential (primary) hypertension: Secondary | ICD-10-CM | POA: Diagnosis not present

## 2020-11-23 DIAGNOSIS — Z471 Aftercare following joint replacement surgery: Secondary | ICD-10-CM | POA: Diagnosis not present

## 2020-11-23 DIAGNOSIS — K760 Fatty (change of) liver, not elsewhere classified: Secondary | ICD-10-CM | POA: Diagnosis not present

## 2020-11-23 DIAGNOSIS — M549 Dorsalgia, unspecified: Secondary | ICD-10-CM | POA: Diagnosis not present

## 2020-11-27 DIAGNOSIS — K760 Fatty (change of) liver, not elsewhere classified: Secondary | ICD-10-CM | POA: Diagnosis not present

## 2020-11-27 DIAGNOSIS — M549 Dorsalgia, unspecified: Secondary | ICD-10-CM | POA: Diagnosis not present

## 2020-11-27 DIAGNOSIS — F419 Anxiety disorder, unspecified: Secondary | ICD-10-CM | POA: Diagnosis not present

## 2020-11-27 DIAGNOSIS — F32A Depression, unspecified: Secondary | ICD-10-CM | POA: Diagnosis not present

## 2020-11-27 DIAGNOSIS — I10 Essential (primary) hypertension: Secondary | ICD-10-CM | POA: Diagnosis not present

## 2020-11-27 DIAGNOSIS — Z471 Aftercare following joint replacement surgery: Secondary | ICD-10-CM | POA: Diagnosis not present

## 2020-11-28 DIAGNOSIS — K760 Fatty (change of) liver, not elsewhere classified: Secondary | ICD-10-CM | POA: Diagnosis not present

## 2020-11-28 DIAGNOSIS — M549 Dorsalgia, unspecified: Secondary | ICD-10-CM | POA: Diagnosis not present

## 2020-11-28 DIAGNOSIS — F419 Anxiety disorder, unspecified: Secondary | ICD-10-CM | POA: Diagnosis not present

## 2020-11-28 DIAGNOSIS — I10 Essential (primary) hypertension: Secondary | ICD-10-CM | POA: Diagnosis not present

## 2020-11-28 DIAGNOSIS — M19019 Primary osteoarthritis, unspecified shoulder: Secondary | ICD-10-CM

## 2020-11-28 DIAGNOSIS — F32A Depression, unspecified: Secondary | ICD-10-CM | POA: Diagnosis not present

## 2020-11-28 DIAGNOSIS — Z471 Aftercare following joint replacement surgery: Secondary | ICD-10-CM | POA: Diagnosis not present

## 2020-11-30 DIAGNOSIS — F419 Anxiety disorder, unspecified: Secondary | ICD-10-CM | POA: Diagnosis not present

## 2020-11-30 DIAGNOSIS — M549 Dorsalgia, unspecified: Secondary | ICD-10-CM | POA: Diagnosis not present

## 2020-11-30 DIAGNOSIS — I10 Essential (primary) hypertension: Secondary | ICD-10-CM | POA: Diagnosis not present

## 2020-11-30 DIAGNOSIS — F32A Depression, unspecified: Secondary | ICD-10-CM | POA: Diagnosis not present

## 2020-11-30 DIAGNOSIS — Z471 Aftercare following joint replacement surgery: Secondary | ICD-10-CM | POA: Diagnosis not present

## 2020-11-30 DIAGNOSIS — K760 Fatty (change of) liver, not elsewhere classified: Secondary | ICD-10-CM | POA: Diagnosis not present

## 2020-12-04 ENCOUNTER — Encounter: Payer: Self-pay | Admitting: Physical Therapy

## 2020-12-04 ENCOUNTER — Other Ambulatory Visit: Payer: Self-pay

## 2020-12-04 ENCOUNTER — Other Ambulatory Visit (HOSPITAL_BASED_OUTPATIENT_CLINIC_OR_DEPARTMENT_OTHER): Payer: Self-pay

## 2020-12-04 ENCOUNTER — Ambulatory Visit: Payer: Medicare Other | Attending: Orthopedic Surgery | Admitting: Physical Therapy

## 2020-12-04 DIAGNOSIS — M6281 Muscle weakness (generalized): Secondary | ICD-10-CM | POA: Diagnosis not present

## 2020-12-04 DIAGNOSIS — M25612 Stiffness of left shoulder, not elsewhere classified: Secondary | ICD-10-CM | POA: Insufficient documentation

## 2020-12-04 DIAGNOSIS — M25512 Pain in left shoulder: Secondary | ICD-10-CM

## 2020-12-04 DIAGNOSIS — M62838 Other muscle spasm: Secondary | ICD-10-CM | POA: Diagnosis not present

## 2020-12-04 MED ORDER — TETANUS-DIPHTH-ACELL PERTUSSIS 5-2.5-18.5 LF-MCG/0.5 IM SUSY
PREFILLED_SYRINGE | INTRAMUSCULAR | 0 refills | Status: DC
  Filled 2020-12-04: qty 0.5, 1d supply, fill #0

## 2020-12-04 NOTE — Therapy (Signed)
[facility] [address]  [place], [place], [address] Phone: [phone]   Fax:  [phone]  Physical Therapy Evaluation  Patient Details  Name: [name] MRN: [id number] Date of Birth: 08/22/1945 Referring Provider (PT): Marcene Duos, MD   Encounter Date: 12/04/2020   PT End of Session - 12/04/20 1343    Visit Number 1    Number of Visits 17    Date for PT Re-Evaluation 01/29/21    Authorization Type Medicare & Tricare    PT Start Time 1302    PT Stop Time 1338    PT Time Calculation (min) 36 min    Activity Tolerance Patient tolerated treatment well    Behavior During Therapy [facility] for tasks assessed/performed           Past Medical History:  Diagnosis Date  . Abnormal CT scan, gastrointestinal tract suspect angioedema   . Anemia 03/16/2016   IRON  . Anxiety   . Arthritis   . Back pain 10/10/2012  . Fatty liver 2005   seen on 2005 ultrasound,05/2015 CT.   Marland Kitchen Hemorrhoids 08/22/2016  . Hemorrhoids, internal, with bleeding 2005   internal and external. 2005 band ligation (dr Ferdinand Lango in [facility])  . Hyperglycemia 08/21/2013  . Hyperlipidemia   . Hypertension   . Nocturia 08/21/2013  . Obesity (BMI 30.0-34.9)   . Osteoporosis   . Pneumonia    as a child  . Rectal bleeding 07/30/2014  . Renal cyst   . SBO (small bowel obstruction) ([place])   . 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 Unspecified constipation 02/06/2014  . Vitamin D deficiency     Past Surgical History:  Procedure Laterality Date  . COLONOSCOPY  before 2005, 2012, 2013, 08/2014.   Marland Kitchen ENTEROSCOPY N/A 10/05/2017   Procedure: ENTEROSCOPY;  Surgeon: Gatha Mayer, MD;  [facility];  Service: Endoscopy;  Laterality: N/A;  . FRACTURE SURGERY Left 1989   leg  . HEMORRHOID BANDING  2005  . KNEE SURGERY Left 2010   arthroscopy, torn carilage  . KNEE SURGERY Right 2012   arthroscopy  . TONSILLECTOMY  1957  .  TONSILLECTOMY    . TOTAL SHOULDER ARTHROPLASTY Left 11/01/2020   Procedure: LEFT ANATOMIC SHOULDER REPLACEMENT;  Surgeon: Meredith Pel, MD;  Location: [facility];  Service: Orthopedics;  Laterality: Left;  Marland Kitchen VENTRAL HERNIA REPAIR N/A 06/03/2018   Procedure: LAPAROSCOPIC VENTRAL WALL  HERNIA  REPAIR WITH MESH  ERAS PATHWAY;  Surgeon: Michael Boston, MD;  Location: [facility];  Service: General;  Laterality: N/A;    There were no vitals filed for this visit.    Subjective Assessment - 12/04/20 1303    Subjective Patient reports undergoing L shoulder replacement on 11/01/20. MD took him out of the sling after 2 weeks. Reports having the CPM machine and HH PT for about a month. Feels that his really helped. Pain fluctuates, which is managed by ice machine. Pain is located over the L superior shoulder and down the lateral arm. Sometimes worse at night and when sitting. Notes very slight N/T in the L digits 1 & 2 at PLOF, no N/T changes since surgery.    Pertinent History osteoporosis, HTN, HLD, hyperglycemia, anxiety, anemia, L LE fx surgery 1989, L knee scope 2010, R knee scope 2012    Limitations Lifting;House hold activities;Writing    Diagnostic tests none recent    Patient Stated Goals decrease pain, improve motion  Currently in Pain? Yes    Pain Score 0-No pain    Pain Location Shoulder    Pain Orientation Left;Lateral    Pain Descriptors / Indicators Throbbing    Pain Type Acute pain;Surgical pain              [facility] PT Assessment - 12/04/20 1309      Assessment   Medical Diagnosis s/p L TSA    Referring Provider (PT) Marcene Duos, MD    Onset Date/Surgical Date 11/01/20    Hand Dominance Right    Next MD Visit 12/13/20    Prior Therapy no- only HHPT      Precautions   Precaution Comments per MD- "AAROM and PROM as tolerated"; educated pt on avoiding IR/behind the back motions      Balance Screen   Has the patient fallen in the past 6 months No    Has the patient had a  decrease in activity level because of a fear of falling?  No    Is the patient reluctant to leave their home because of a fear of falling?  No      Home Ecologist residence    Living Arrangements Spouse/significant other    Available Help at Discharge Family    Type of [place]      Prior Function   Level of Circle Retired    Leisure golf, gym, Pension scheme manager   Overall Cognitive Status Within Functional Limits for tasks assessed      Observation/Other Assessments   Observations L shoulder incision well-healed without warmth or redness      Sensation   Light Touch Appears Intact      Posture/Postural Control   Posture/Postural Control Postural limitations    Postural Limitations Rounded Shoulders;Forward head      ROM / Strength   AROM / PROM / Strength AROM;PROM;Strength      AROM   AROM Assessment Site Shoulder    Right/Left Shoulder Right   notes a current "tear in R my shoulder"   Right Shoulder Flexion 141 Degrees    Right Shoulder ABduction 168 Degrees    Right Shoulder Internal Rotation --   FIR L4; pain   Right Shoulder External Rotation --   FER T2     PROM   PROM Assessment Site Shoulder    Right/Left Shoulder Left    Left Shoulder Flexion 107 Degrees    Left Shoulder ABduction 75 Degrees    Left Shoulder Internal Rotation --   to belly   Left Shoulder External Rotation 0 Degrees      Strength   Strength Assessment Site Shoulder    Right/Left Shoulder Right    Right Shoulder Flexion 4+/5    Right Shoulder ABduction 4+/5    Right Shoulder Internal Rotation 4+/5    Right Shoulder External Rotation 4+/5      Palpation   Palpation comment TTP over L infraspinatus/teres group, biceps muscle belly, lateral deltoid; soft tissue restriction over L shoulder incision                      Objective measurements completed on examination: See above findings.                PT Education - 12/04/20 1342    Education Details prognosis, POC, HEP; edu on scar mobilization; edu on post-op precautions including no IR/behind the  back motions, heavy lifting, elevating L UE on pillow in supine; edu on typical treatment course after TSA    Person(s) Educated Patient    Methods Explanation;Demonstration;Tactile cues;Verbal cues;Handout    Comprehension Verbalized understanding;Returned demonstration            PT Short Term Goals - 12/04/20 1347      PT SHORT TERM GOAL #1   Title Patient to be independent with initial HEP.    Time 3    Period Weeks    Status New    Target Date 12/25/20             PT Long Term Goals - 12/04/20 1347      PT LONG TERM GOAL #1   Title Patient to be independent with advanced HEP.    Time 8    Period Weeks    Status New    Target Date 01/29/21      PT LONG TERM GOAL #2   Title Patient to demonstrate L UE strength >/=4+/5.    Time 8    Period Weeks    Status New    Target Date 01/29/21      PT LONG TERM GOAL #3   Title Patient to demonstrate L shoulder AROM WFL and without pain limiting.    Time 8    Period Weeks    Status New    Target Date 01/29/21      PT LONG TERM GOAL #4   Title Patient to lift 5lbs overhead with L UE with good scapular mechanics and no pain.    Time 8    Period Weeks    Status New    Target Date 01/29/21      PT LONG TERM GOAL #5   Title Patient to return to gym with modifications as needed.    Time 8    Period Weeks    Status New    Target Date 01/29/21                  Plan - 12/04/20 1343    Clinical Impression Statement Patient is a 75 y/o M presenting to OPPT with c/o L shoulder pain s/p L TSA on 11/01/20. Now out of sling and notes that he had HHPT and CPM x 4 weeks post-surgery. Pain is located over the L superior shoulder and down the lateral arm. Worse with sitting and in PM. Patient today presenting with rounded and forward head posture,  limited L shoulder ROM, L UE weakness, and TTP over L infraspinatus/teres group, biceps muscle belly, lateral deltoid. Patient was educated on gentle PROM/AAROM HEP as well as post-op precautions- patient reported understanding. Would benefit from skilled PT services 2x/week for 8 weeks to address aforementioned impairments.    Personal Factors and Comorbidities Age;Comorbidity 3+;Past/Current Experience;Time since onset of injury/illness/exacerbation    Comorbidities osteoporosis, HTN, HLD, hyperglycemia, anxiety, anemia, L LE fx surgery 1989, L knee scope 2010, R knee scope 2012    Examination-Activity Limitations Bathing;Sit;Sleep;Bed Mobility;Bend;Caring for Others;Carry;Dressing;Transfers;Hygiene/Grooming;Lift;Reach Overhead    Examination-Participation Restrictions Cleaning;Community Activity;Shop;Driving;Yard Work;Laundry;Meal Prep    Stability/Clinical Decision Making Stable/Uncomplicated    Clinical Decision Making Low    Rehab Potential Good    PT Frequency 2x / week    PT Duration 8 weeks    PT Treatment/Interventions ADLs/Self Care Home Management;Cryotherapy;Electrical Stimulation;Iontophoresis 4mg /ml Dexamethasone;Moist Heat;Balance training;Therapeutic exercise;Therapeutic activities;Functional mobility training;Ultrasound;Neuromuscular re-education;Patient/family education;Manual techniques;Vasopneumatic Device;Taping;Energy conservation;Dry needling;Passive range of motion;Scar mobilization    PT Next Visit Plan reassess  HEP; progress PROM/AAROM to tolerance    Consulted and Agree with Plan of Care Patient           Patient will benefit from skilled therapeutic intervention in order to improve the following deficits and impairments:  Impaired flexibility,Postural dysfunction,Improper body mechanics,Decreased range of motion,Increased muscle spasms,Pain,Impaired UE functional use,Increased fascial restricitons,Decreased strength,Decreased activity tolerance,Increased edema,Decreased  scar mobility,Hypomobility  Visit Diagnosis: Acute pain of left shoulder  Stiffness of left shoulder, not elsewhere classified  Muscle weakness (generalized)  Other muscle spasm     Problem List Patient Active Problem List   Diagnosis Date Noted  . Shoulder arthritis   . S/P shoulder replacement, 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  . Skin tags, multiple acquired 07/07/2019  . LUQ pain 07/07/2019  . Primary osteoarthritis of right knee 03/16/2019  . Ventral hernia s/p lap repair with mesh 06/03/2018 06/03/2018  . Hemorrhoids 08/22/2016  . Anemia 03/16/2016  . Right shoulder pain 02/26/2016  . Abnormal CT scan, gastrointestinal tract suspect angioedema   . Abdominal pain   . Hyperglycemia 08/21/2013  . Low back pain 10/10/2012  . Hyperlipidemia   . Hypertension, essential    . History of colon polyps   . Obesity (BMI 30.0-34.9)      Janene Harvey, PT, DPT 12/04/20 1:50 PM   [facility] [address]  [place], [place], [address] Phone: [phone]   Fax:  [phone]  Name: [name] MRN: [id number] Date of Birth: 04-Jan-1946

## 2020-12-05 DIAGNOSIS — H6123 Impacted cerumen, bilateral: Secondary | ICD-10-CM | POA: Diagnosis not present

## 2020-12-07 ENCOUNTER — Encounter: Payer: Self-pay | Admitting: Physical Therapy

## 2020-12-07 ENCOUNTER — Ambulatory Visit: Payer: Medicare Other | Admitting: Physical Therapy

## 2020-12-07 ENCOUNTER — Other Ambulatory Visit: Payer: Self-pay

## 2020-12-07 DIAGNOSIS — M25512 Pain in left shoulder: Secondary | ICD-10-CM | POA: Diagnosis not present

## 2020-12-07 DIAGNOSIS — M62838 Other muscle spasm: Secondary | ICD-10-CM | POA: Diagnosis not present

## 2020-12-07 DIAGNOSIS — M6281 Muscle weakness (generalized): Secondary | ICD-10-CM

## 2020-12-07 DIAGNOSIS — M25612 Stiffness of left shoulder, not elsewhere classified: Secondary | ICD-10-CM | POA: Diagnosis not present

## 2020-12-07 NOTE — Therapy (Signed)
[facility] [address]  [place], [place], [address] Phone: [phone]   Fax:  [phone]  Physical Therapy Treatment  Patient Details  Name: Beckam Abdulaziz MRN: [id number] Date of Birth: 19-Feb-1946 Referring Provider (PT): Marcene Duos, MD   Encounter Date: 12/07/2020   PT End of Session - 12/07/20 0933     Visit Number 2    Number of Visits 17    Date for PT Re-Evaluation 01/29/21    Authorization Type Medicare & Tricare    PT Start Time 9983    PT Stop Time 0939    PT Time Calculation (min) 53 min    Activity Tolerance Patient tolerated treatment well    Behavior During Therapy [facility] for tasks assessed/performed             Past Medical History:  Diagnosis Date   Abnormal CT scan, gastrointestinal tract suspect angioedema    Anemia 03/16/2016   IRON   Anxiety    Arthritis    Back pain 10/10/2012   Fatty liver 2005   seen on 2005 ultrasound,05/2015 CT.    Hemorrhoids 08/22/2016   Hemorrhoids, internal, with bleeding 2005   internal and external. 2005 band ligation (dr Ferdinand Lango in [facility])   Hyperglycemia 08/21/2013   Hyperlipidemia    Hypertension    Nocturia 08/21/2013   Obesity (BMI 30.0-34.9)    Osteoporosis    Pneumonia    as a child   Rectal bleeding 07/30/2014   Renal cyst    SBO (small bowel obstruction) ([place])    Tubular adenoma of colon before 2005, 2012, 08/2014   Unspecified constipation 02/06/2014   Vitamin D deficiency     Past Surgical History:  Procedure Laterality Date   COLONOSCOPY  before 2005, 2012, 2013, 08/2014.    ENTEROSCOPY N/A 10/05/2017   Procedure: ENTEROSCOPY;  Surgeon: Gatha Mayer, MD;  [facility]: [facility];  Service: Endoscopy;  Laterality: N/A;   FRACTURE SURGERY Left 1989   leg   HEMORRHOID BANDING  2005   KNEE SURGERY Left 2010   arthroscopy, torn carilage   KNEE SURGERY Right 2012   arthroscopy   TONSILLECTOMY  1957   TONSILLECTOMY     TOTAL  SHOULDER ARTHROPLASTY Left 11/01/2020   Procedure: LEFT ANATOMIC SHOULDER REPLACEMENT;  Surgeon: Meredith Pel, MD;  Location: [facility];  Service: Orthopedics;  Laterality: Left;   VENTRAL HERNIA REPAIR N/A 06/03/2018   Procedure: LAPAROSCOPIC VENTRAL WALL  HERNIA  REPAIR WITH MESH  ERAS PATHWAY;  Surgeon: Michael Boston, MD;  [facility];  Service: General;  Laterality: N/A;    There were no vitals filed for this visit.   Subjective Assessment - 12/07/20 0847     Subjective Has been doing his HEP and denies questions.    Pertinent History osteoporosis, HTN, HLD, hyperglycemia, anxiety, anemia, L LE fx surgery 1989, L knee scope 2010, R knee scope 2012    Diagnostic tests none recent    Patient Stated Goals decrease pain, improve motion    Currently in Pain? Yes    Pain Score 5     Pain Location Shoulder    Pain Orientation Left;Anterior;Lateral    Pain Descriptors / Indicators Aching    Pain Type Acute pain;Surgical pain                [facility] PT Assessment - 12/07/20 0853       Observation/Other Assessments   Focus on Therapeutic Outcomes (  FOTO)  shoulder: 53      Strength   Strength Assessment Site Elbow;Wrist;Hand    Right/Left Elbow Right;Left    Right Elbow Flexion 4+/5    Right Elbow Extension 4+/5    Left Elbow Flexion 4+/5    Left Elbow Extension 4/5    Right/Left Wrist Right;Left    Right Wrist Flexion 4+/5    Right Wrist Extension 4+/5    Left Wrist Flexion 4+/5    Left Wrist Extension 4+/5    Right/Left hand Right;Left    Right Hand Grip (lbs) 58.33   62, 60, 53   Left Hand Grip (lbs) 45   50, 45, 40                          [facility] Adult PT Treatment/Exercise - 12/07/20 0001       Exercises   Exercises Shoulder      Shoulder Exercises: Supine   External Rotation AAROM;Left;5 reps    External Rotation Limitations shoulder in neutral    Flexion Strengthening;Left;5 reps    Flexion Limitations with wand to tolerance    ABduction  AAROM;Left;10 reps    ABduction Limitations scaption with wand; cues for proper alignment      Shoulder Exercises: Standing   Other Standing Exercises R shoulder pendulums 30" CW/CCW/ant-pos/M-L   cues to utilize LEs     Shoulder Exercises: Pulleys   Flexion 3 minutes    Flexion Limitations to tolerance    Scaption 3 minutes    Scaption Limitations to tolerance      Modalities   Modalities Vasopneumatic      Vasopneumatic   Number Minutes Vasopneumatic  10 minutes    Vasopnuematic Location  Shoulder   L   Vasopneumatic Pressure Low    Vasopneumatic Temperature  coldest      Manual Therapy   Manual Therapy Soft tissue mobilization;Passive ROM;Joint mobilization;Myofascial release    Manual therapy comments supine    Joint Mobilization L GH AP/PA/inferior jt mobs grade III    Soft tissue mobilization STM to L pec, proximal biceps, biceps muscle belly    Myofascial Release manual TPR to L pec and biceps muscle belly   palpable tenderness and tender trigger points   Passive ROM L shoulder PROM flexion, abduction, ER pain-free   restriction in end-ranges                   PT Education - 12/07/20 0932     Education Details update to HEP; edu on use of stress ball and over the door pulley    Person(s) Educated Patient    Methods Explanation;Demonstration;Tactile cues;Verbal cues;Handout    Comprehension Verbalized understanding;Returned demonstration              PT Short Term Goals - 12/07/20 0933       PT SHORT TERM GOAL #1   Title Patient to be independent with initial HEP.    Time 3    Period Weeks    Status On-going    Target Date 12/25/20               PT Long Term Goals - 12/07/20 0933       PT LONG TERM GOAL #1   Title Patient to be independent with advanced HEP.    Time 8    Period Weeks    Status On-going      PT LONG TERM GOAL #2  Title Patient to demonstrate L UE strength >/=4+/5.    Time 8    Period Weeks    Status On-going       PT LONG TERM GOAL #3   Title Patient to demonstrate L shoulder AROM WFL and without pain limiting.    Time 8    Period Weeks    Status On-going      PT LONG TERM GOAL #4   Title Patient to lift 5lbs overhead with L UE with good scapular mechanics and no pain.    Time 8    Period Weeks    Status On-going      PT LONG TERM GOAL #5   Title Patient to return to gym with modifications as needed.    Time 8    Period Weeks    Status On-going                   Plan - 12/07/20 0933     Clinical Impression Statement Patient reports compliance with HEP and denies questions. Worked on reviewing pendulums with patient demonstrating good amplitude of movement and tolerance; requiring cueing to avoid shoulder AROM. Strength testing revealed L triceps and grip weakness. L shoulder PROM was performed within pain-free ROM but with evident restriction at end-ranges. Patient tolerated gentle joint mobs and STM/TPR for improvement in ROM and soft tissue restriction. Encouraged use of stress ball for grip strength and over the door pulley for continued ROM at home- patient reported understanding. Patient required consistent cueing to avoid L shoulder AROM throughout session. Also required correction of form and alignment with shoulder AAROM activities- able to perform proper form by end of session today. Ended session with [employer] to L shoulder for post-exercise soreness. No complaints at end of session. Patient progressing well towards goals.    Comorbidities osteoporosis, HTN, HLD, hyperglycemia, anxiety, anemia, L LE fx surgery 1989, L knee scope 2010, R knee scope 2012    PT Treatment/Interventions ADLs/Self Care Home Management;Cryotherapy;Electrical Stimulation;Iontophoresis 4mg /ml Dexamethasone;Moist Heat;Balance training;Therapeutic exercise;Therapeutic activities;Functional mobility training;Ultrasound;Neuromuscular re-education;Patient/family education;Manual techniques;Vasopneumatic  Device;Taping;Energy conservation;Dry needling;Passive range of motion;Scar mobilization    PT Next Visit Plan reassess HEP; progress PROM/AAROM to tolerance    Consulted and Agree with Plan of Care Patient             Patient will benefit from skilled therapeutic intervention in order to improve the following deficits and impairments:  Impaired flexibility, Postural dysfunction, Improper body mechanics, Decreased range of motion, Increased muscle spasms, Pain, Impaired UE functional use, Increased fascial restricitons, Decreased strength, Decreased activity tolerance, Increased edema, Decreased scar mobility, Hypomobility  Visit Diagnosis: Acute pain of left shoulder  Stiffness of left shoulder, not elsewhere classified  Muscle weakness (generalized)  Other muscle spasm     Problem List 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   Skin tags, multiple acquired 07/07/2019   LUQ pain 07/07/2019   Primary osteoarthritis of right knee 03/16/2019   Ventral hernia s/p lap repair with mesh 06/03/2018 06/03/2018   Hemorrhoids 08/22/2016   Anemia 03/16/2016   Right shoulder pain 02/26/2016   Abnormal CT scan, gastrointestinal tract suspect angioedema    Abdominal pain    Hyperglycemia 08/21/2013   Low back pain 10/10/2012   Hyperlipidemia    Hypertension, essential     History of colon polyps    Obesity (BMI 30.0-34.9)     Janene Harvey, PT, DPT 12/07/20  10:39 AM    [facility] [address]  [place], [place], [address] Phone: [phone]   Fax:  [phone]  Name: Deangleo Passage MRN: [id number] Date of Birth: May 31, 1946

## 2020-12-11 ENCOUNTER — Ambulatory Visit: Payer: Medicare Other

## 2020-12-11 ENCOUNTER — Other Ambulatory Visit: Payer: Self-pay

## 2020-12-11 DIAGNOSIS — M6281 Muscle weakness (generalized): Secondary | ICD-10-CM

## 2020-12-11 DIAGNOSIS — M25512 Pain in left shoulder: Secondary | ICD-10-CM

## 2020-12-11 DIAGNOSIS — M25612 Stiffness of left shoulder, not elsewhere classified: Secondary | ICD-10-CM

## 2020-12-11 DIAGNOSIS — M62838 Other muscle spasm: Secondary | ICD-10-CM | POA: Diagnosis not present

## 2020-12-11 NOTE — Therapy (Signed)
[facility] [address]  [name], [place], [address] Phone: [phone]   Fax:  [phone]  Physical Therapy Treatment  Patient Details  Name: [name] MRN: [id number] Date of Birth: September 09, 1945 Referring Provider (PT): Marcene Duos, MD   Encounter Date: 12/11/2020   PT End of Session - 12/11/20 0847     Visit Number 3    Number of Visits 17    Date for PT Re-Evaluation 01/29/21    Authorization Type Medicare & Tricare    PT Start Time 0800    PT Stop Time 5009    PT Time Calculation (min) 57 min    Activity Tolerance Patient tolerated treatment well    Behavior During Therapy [facility] for tasks assessed/performed             Past Medical History:  Diagnosis Date   Abnormal CT scan, gastrointestinal tract suspect angioedema    Anemia 03/16/2016   IRON   Anxiety    Arthritis    Back pain 10/10/2012   Fatty liver 2005   seen on 2005 ultrasound,05/2015 CT.    Hemorrhoids 08/22/2016   Hemorrhoids, internal, with bleeding 2005   internal and external. 2005 band ligation (dr Ferdinand Lango in [facility])   Hyperglycemia 08/21/2013   Hyperlipidemia    Hypertension    Nocturia 08/21/2013   Obesity (BMI 30.0-34.9)    Osteoporosis    Pneumonia    as a child   Rectal bleeding 07/30/2014   Renal cyst    SBO (small bowel obstruction) ([place])    Tubular adenoma of colon before 2005, 2012, 08/2014   Unspecified constipation 02/06/2014   Vitamin D deficiency     Past Surgical History:  Procedure Laterality Date   COLONOSCOPY  before 2005, 2012, 2013, 08/2014.    ENTEROSCOPY N/A 10/05/2017   Procedure: ENTEROSCOPY;  Surgeon: Gatha Mayer, MD;  [facility];  Service: Endoscopy;  Laterality: N/A;   FRACTURE SURGERY Left 1989   leg   HEMORRHOID BANDING  2005   KNEE SURGERY Left 2010   arthroscopy, torn carilage   KNEE SURGERY Right 2012   arthroscopy   TONSILLECTOMY  1957   TONSILLECTOMY     TOTAL  SHOULDER ARTHROPLASTY Left 11/01/2020   Procedure: LEFT ANATOMIC SHOULDER REPLACEMENT;  Surgeon: Meredith Pel, MD;  Location: [facility];  Service: Orthopedics;  Laterality: Left;   VENTRAL HERNIA REPAIR N/A 06/03/2018   Procedure: LAPAROSCOPIC VENTRAL WALL  HERNIA  REPAIR WITH MESH  ERAS PATHWAY;  Surgeon: Michael Boston, MD;  [facility];  Service: General;  Laterality: N/A;    There were no vitals filed for this visit.   Subjective Assessment - 12/11/20 0803     Subjective Pt reports that his L shoulder has been getting better still noting soreness.    Pertinent History osteoporosis, HTN, HLD, hyperglycemia, anxiety, anemia, L LE fx surgery 1989, L knee scope 2010, R knee scope 2012    Diagnostic tests none recent    Patient Stated Goals decrease pain, improve motion    Currently in Pain? No/denies   "soreness"                              [facility] Adult PT Treatment/Exercise - 12/11/20 0001       Exercises   Exercises Shoulder      Shoulder Exercises: Supine   External Rotation AAROM;Left;10 reps  External Rotation Limitations shoulder in neutral    Flexion AAROM;AROM;Right;Left;20 reps    Flexion Limitations 10 reps with wand; 10 reps with L UE only    ABduction AAROM;AROM;Left;20 reps    ABduction Limitations 10 reps with wand      Shoulder Exercises: Seated   External Rotation AAROM;Left;10 reps    External Rotation Limitations with wand    Flexion AAROM;Left;5 reps    Flexion Limitations with wand      Shoulder Exercises: Pulleys   Flexion 3 minutes    Flexion Limitations to tolerance    Scaption 3 minutes    Scaption Limitations to tolerance      Modalities   Modalities Vasopneumatic      Vasopneumatic   Number Minutes Vasopneumatic  10 minutes    Vasopnuematic Location  Shoulder    Vasopneumatic Pressure Low    Vasopneumatic Temperature  coldest      Manual Therapy   Manual Therapy Soft tissue mobilization;Passive ROM;Joint  mobilization;Myofascial release    Manual therapy comments supine    Joint Mobilization L GH AP/PA/inferior jt mobs grade III    Soft tissue mobilization STM to L proximal biceps, biceps muscle belly    Myofascial Release manual TPR to L biceps muscle belly    Passive ROM L shoulder PROM flexion, abduction, ER pain-free                      PT Short Term Goals - 12/07/20 0933       PT SHORT TERM GOAL #1   Title Patient to be independent with initial HEP.    Time 3    Period Weeks    Status On-going    Target Date 12/25/20               PT Long Term Goals - 12/07/20 0933       PT LONG TERM GOAL #1   Title Patient to be independent with advanced HEP.    Time 8    Period Weeks    Status On-going      PT LONG TERM GOAL #2   Title Patient to demonstrate L UE strength >/=4+/5.    Time 8    Period Weeks    Status On-going      PT LONG TERM GOAL #3   Title Patient to demonstrate L shoulder AROM WFL and without pain limiting.    Time 8    Period Weeks    Status On-going      PT LONG TERM GOAL #4   Title Patient to lift 5lbs overhead with L UE with good scapular mechanics and no pain.    Time 8    Period Weeks    Status On-going      PT LONG TERM GOAL #5   Title Patient to return to gym with modifications as needed.    Time 8    Period Weeks    Status On-going                   Plan - 12/11/20 0850     Clinical Impression Statement Pt responded well to the exercises today with no reports of pain. He did note that with ER he has increased pulling along the ant portion of the shoulder but not pain. He required cues to keep his shoulder depressed with seated AA shoulder flexion. PROM was pain free but notable restrictions were felt into end range motions. Also instruction given  during treatment to avoid pushing into pain. Ended session with game ready to reduce swelling and soreness.    Personal Factors and Comorbidities Age;Comorbidity  3+;Past/Current Experience;Time since onset of injury/illness/exacerbation    Comorbidities osteoporosis, HTN, HLD, hyperglycemia, anxiety, anemia, L LE fx surgery 1989, L knee scope 2010, R knee scope 2012    PT Frequency 2x / week    PT Duration 8 weeks    PT Treatment/Interventions ADLs/Self Care Home Management;Cryotherapy;Electrical Stimulation;Iontophoresis 4mg /ml Dexamethasone;Moist Heat;Balance training;Therapeutic exercise;Therapeutic activities;Functional mobility training;Ultrasound;Neuromuscular re-education;Patient/family education;Manual techniques;Vasopneumatic Device;Taping;Energy conservation;Dry needling;Passive range of motion;Scar mobilization    PT Next Visit Plan reassess HEP; progress PROM/AAROM to tolerance    Consulted and Agree with Plan of Care Patient             Patient will benefit from skilled therapeutic intervention in order to improve the following deficits and impairments:  Impaired flexibility, Postural dysfunction, Improper body mechanics, Decreased range of motion, Increased muscle spasms, Pain, Impaired UE functional use, Increased fascial restricitons, Decreased strength, Decreased activity tolerance, Increased edema, Decreased scar mobility, Hypomobility  Visit Diagnosis: Acute pain of left shoulder  Stiffness of left shoulder, not elsewhere classified  Muscle weakness (generalized)  Other muscle spasm     Problem List 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  [place], [place], [address] Phone: [phone]   Fax:  [phone]  Name: Renold Kozar MRN: [id number] Date of Birth: Jun 04, 1946

## 2020-12-13 ENCOUNTER — Ambulatory Visit (INDEPENDENT_AMBULATORY_CARE_PROVIDER_SITE_OTHER): Payer: Medicare Other | Admitting: Orthopedic Surgery

## 2020-12-13 ENCOUNTER — Other Ambulatory Visit: Payer: Self-pay

## 2020-12-13 ENCOUNTER — Encounter: Payer: Self-pay | Admitting: Orthopedic Surgery

## 2020-12-13 DIAGNOSIS — Z96612 Presence of left artificial shoulder joint: Secondary | ICD-10-CM

## 2020-12-13 NOTE — Progress Notes (Signed)
Post-Op Visit Note   Patient: [name]           Date of Birth: [birth year]           MRN: [id number] Visit Date: 12/13/2020 PCP: Mosie Lukes, MD   Assessment & Plan:  Chief Complaint:  Chief Complaint  Patient presents with   Left Shoulder - Routine Post Op   Visit Diagnoses:  1. S/P shoulder replacement, left     Plan: [name] is now 6 weeks out left shoulder replacement for arthritis.  He is doing home exercise program along with pulley.  Therapy note written today to start doing some strengthening exercises as well as continue with range of motion.  He is doing therapy in [facility].  Range of motion today on the left passively is 25/75/100.  Subscap strength is 5- out of 5 on the left which is good.  Continue and initiate rotator cuff strengthening at this time.  Main focus should be on getting more range of motion.  Come back in 6 weeks for clinical recheck.  Taking Tylenol for pain.  I would also like for him to start doing some golf specific exercises.  Follow-Up Instructions: Return in about 6 weeks (around 01/24/2021).   Orders:  No orders of the defined types were placed in this encounter.  No orders of the defined types were placed in this encounter.   Imaging: No results found.  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   Skin tags, multiple acquired 07/07/2019   LUQ pain 07/07/2019   Primary osteoarthritis of right knee 03/16/2019   Ventral hernia s/p lap repair with mesh 06/03/2018 06/03/2018   Hemorrhoids 08/22/2016   Anemia 03/16/2016   Right shoulder pain 02/26/2016   Abnormal CT scan, gastrointestinal tract suspect angioedema    Abdominal pain    Hyperglycemia 08/21/2013   Low back pain 10/10/2012   Hyperlipidemia    Hypertension, essential     History of colon polyps    Obesity (BMI 30.0-34.9)    Past Medical History:  Diagnosis Date   Abnormal  CT scan, gastrointestinal tract suspect angioedema    Anemia 03/16/2016   IRON   Anxiety    Arthritis    Back pain 10/10/2012   Fatty liver 2005   seen on 2005 ultrasound,05/2015 CT.    Hemorrhoids 08/22/2016   Hemorrhoids, internal, with bleeding 2005   internal and external. 2005 band ligation (dr Ferdinand Lango in [facility])   Hyperglycemia 08/21/2013   Hyperlipidemia    Hypertension    Nocturia 08/21/2013   Obesity (BMI 30.0-34.9)    Osteoporosis    Pneumonia    as a child   Rectal bleeding 07/30/2014   Renal cyst    SBO (small bowel obstruction) ([facility])    Tubular adenoma of colon before 2005, 2012, 08/2014   Unspecified constipation 02/06/2014   Vitamin D deficiency     Family History  Problem Relation Age of Onset   Stroke Mother    Aneurysm Mother        brain   Arthritis Father    Liver cancer Father        liver, atomic testing in [place]   Gallbladder disease Father    Hyperlipidemia Sister    Hypertension Sister    Obesity Sister    Diabetes Sister    Diabetes Brother    Hyperlipidemia Sister  Hypertension Sister    Obesity Sister    Diabetes Sister    Aneurysm Brother        brain   COPD Brother    Gallbladder disease Sister        x4   Colon cancer Neg Hx    Colon polyps Neg Hx    Esophageal cancer Neg Hx    Rectal cancer Neg Hx    Stomach cancer Neg Hx    Prostate cancer Neg Hx    CAD Neg Hx     Past Surgical History:  Procedure Laterality Date   COLONOSCOPY  before 2005, 2012, 2013, 08/2014.    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   Procedure: LEFT ANATOMIC SHOULDER REPLACEMENT;  Surgeon: Meredith Pel, MD;  Location: [facility];  Service: Orthopedics;  Laterality: Left;   VENTRAL HERNIA REPAIR N/A 06/03/2018   Procedure: LAPAROSCOPIC VENTRAL WALL  HERNIA  REPAIR WITH MESH  ERAS PATHWAY;  Surgeon: Michael Boston, MD;  [facility];  Service: General;  Laterality: N/A;   Social History   Occupational History   Occupation: Retired    Fish farm manager: [employer]  Tobacco Use   Smoking status: Never   Smokeless tobacco: Never  Vaping Use   Vaping Use: Never used  Substance and Sexual Activity   Alcohol use: Yes    Alcohol/week: 2.0 standard drinks    Types: 2 Standard drinks or equivalent per week    Comment: Occassionally   Drug use: No   Sexual activity: Yes

## 2020-12-14 ENCOUNTER — Ambulatory Visit: Payer: Medicare Other | Attending: Internal Medicine

## 2020-12-14 ENCOUNTER — Other Ambulatory Visit (HOSPITAL_BASED_OUTPATIENT_CLINIC_OR_DEPARTMENT_OTHER): Payer: Self-pay

## 2020-12-14 ENCOUNTER — Encounter: Payer: Self-pay | Admitting: Physical Therapy

## 2020-12-14 ENCOUNTER — Other Ambulatory Visit: Payer: Self-pay

## 2020-12-14 ENCOUNTER — Ambulatory Visit: Payer: Medicare Other | Admitting: Physical Therapy

## 2020-12-14 ENCOUNTER — Telehealth: Payer: Self-pay | Admitting: Family Medicine

## 2020-12-14 DIAGNOSIS — M62838 Other muscle spasm: Secondary | ICD-10-CM

## 2020-12-14 DIAGNOSIS — M6281 Muscle weakness (generalized): Secondary | ICD-10-CM

## 2020-12-14 DIAGNOSIS — M25512 Pain in left shoulder: Secondary | ICD-10-CM

## 2020-12-14 DIAGNOSIS — M25612 Stiffness of left shoulder, not elsewhere classified: Secondary | ICD-10-CM | POA: Diagnosis not present

## 2020-12-14 DIAGNOSIS — Z23 Encounter for immunization: Secondary | ICD-10-CM

## 2020-12-14 MED ORDER — AMLODIPINE BESYLATE 5 MG PO TABS
5.0000 mg | ORAL_TABLET | Freq: Every day | ORAL | 0 refills | Status: DC
  Filled 2020-12-14: qty 90, 90d supply, fill #0

## 2020-12-14 NOTE — Telephone Encounter (Signed)
Medication: amLODipine (NORVASC) 5 MG tablet [[id number]]     Has the patient contacted their pharmacy? No. (If no, request that the patient contact the pharmacy for the refill.) (If yes, when and what did the pharmacy advise?)     Preferred Pharmacy (with phone number or street name): [facility] [place]  [address], [place] [address]  Phone:  [phone]  Fax:  [phone]      Agent: Please be advised that RX refills may take up to 3 business days. We ask that you follow-up with your pharmacy.

## 2020-12-14 NOTE — Therapy (Signed)
[facility] [address]  [place], [place], [address] Phone: [phone]   Fax:  [phone]  Physical Therapy Treatment  Patient Details  Name: [name] MRN: [id number] Date of Birth: 02-05-1946 Referring Provider (PT): Marcene Duos, MD   Encounter Date: 12/14/2020   PT End of Session - 12/14/20 1147     Visit Number 4    Number of Visits 17    Date for PT Re-Evaluation 01/29/21    Authorization Type Medicare & Tricare    PT Start Time 1059    PT Stop Time 1146    PT Time Calculation (min) 47 min    Activity Tolerance Patient tolerated treatment well    Behavior During Therapy [facility] for tasks assessed/performed             Past Medical History:  Diagnosis Date   Abnormal CT scan, gastrointestinal tract suspect angioedema    Anemia 03/16/2016   IRON   Anxiety    Arthritis    Back pain 10/10/2012   Fatty liver 2005   seen on 2005 ultrasound,05/2015 CT.    Hemorrhoids 08/22/2016   Hemorrhoids, internal, with bleeding 2005   internal and external. 2005 band ligation (dr Ferdinand Lango in [facility])   Hyperglycemia 08/21/2013   Hyperlipidemia    Hypertension    Nocturia 08/21/2013   Obesity (BMI 30.0-34.9)    Osteoporosis    Pneumonia    as a child   Rectal bleeding 07/30/2014   Renal cyst    SBO (small bowel obstruction) ([place])    Tubular adenoma of colon before 2005, 2012, 08/2014   Unspecified constipation 02/06/2014   Vitamin D deficiency     Past Surgical History:  Procedure Laterality Date   COLONOSCOPY  before 2005, 2012, 2013, 08/2014.    ENTEROSCOPY N/A 10/05/2017   Procedure: ENTEROSCOPY;  Surgeon: Gatha Mayer, MD;  Location: [facility];  Service: Endoscopy;  Laterality: N/A;   FRACTURE SURGERY Left 1989   leg   HEMORRHOID BANDING  2005   KNEE SURGERY Left 2010   arthroscopy, torn carilage   KNEE SURGERY Right 2012   arthroscopy   TONSILLECTOMY  1957   TONSILLECTOMY     TOTAL  SHOULDER ARTHROPLASTY Left 11/01/2020   Procedure: LEFT ANATOMIC SHOULDER REPLACEMENT;  Surgeon: Meredith Pel, MD;  Location: [place];  Service: Orthopedics;  Laterality: Left;   VENTRAL HERNIA REPAIR N/A 06/03/2018   Procedure: LAPAROSCOPIC VENTRAL WALL  HERNIA  REPAIR WITH MESH  ERAS PATHWAY;  Surgeon: Michael Boston, MD;  Location: [facility];  Service: General;  Laterality: N/A;    There were no vitals filed for this visit.   Subjective Assessment - 12/14/20 1100     Subjective Patient bringing in note from MD stating "ok for strengthening exercises for RTC; continue to focus on ROM." Patient reports using his pulley at home.    Pertinent History osteoporosis, HTN, HLD, hyperglycemia, anxiety, anemia, L LE fx surgery 1989, L knee scope 2010, R knee scope 2012    Diagnostic tests none recent    Patient Stated Goals decrease pain, improve motion    Currently in Pain? Yes    Pain Score 3     Pain Location Shoulder    Pain Orientation Left;Anterior;Lateral    Pain Descriptors / Indicators Aching    Pain Type Acute pain;Surgical pain  [facility] Adult PT Treatment/Exercise - 12/14/20 0001       Shoulder Exercises: Supine   Flexion AROM;Left;10 reps    Flexion Limitations thumb up; focusing on gentle stretch OH      Shoulder Exercises: Seated   Abduction AAROM;Left;10 reps    ABduction Limitations with wand to tolerance      Shoulder Exercises: Sidelying   External Rotation AROM;Left;10 reps    External Rotation Limitations shoulder in neutral; ROM to tolerance    ABduction AROM;Left;10 reps    ABduction Limitations thumb up   ROM to tolerance     Shoulder Exercises: Standing   External Rotation Strengthening;Left;10 reps;Theraband    Theraband Level (Shoulder External Rotation) Level 1 (Yellow)    External Rotation Limitations isometrics with shoulder in neutral with towel under elbow    Internal Rotation Strengthening;Left;10 reps     Theraband Level (Shoulder Internal Rotation) Level 1 (Yellow)    Internal Rotation Limitations isometrics with shoulder in neutral with towel under elbow    Row Strengthening;Both;10 reps;Theraband    Theraband Level (Shoulder Row) Level 2 (Red)    Row Limitations cues for form    Other Standing Exercises L shoulder IR/ER with yellow TB 10x each   shoulder in neutral     Shoulder Exercises: Pulleys   Flexion 3 minutes    Flexion Limitations cues to decrease speed and focus on stretch    Scaption 3 minutes    Scaption Limitations cues to decrease speed and focus on stretch      Manual Therapy   Manual Therapy Joint mobilization;Passive ROM    Manual therapy comments supine    Joint Mobilization L GH AP/PA/inferior and distraction jt mobs grade III/IV    Passive ROM L shoulder PROM flexion, abduction, ER pain-free with prolonged holds at end range                    PT Education - 12/14/20 1146     Education Details update to HEP; edu on focusing on ROM as main priority    Person(s) Educated Patient    Methods Explanation;Demonstration;Tactile cues;Verbal cues;Handout    Comprehension Verbalized understanding;Returned demonstration              PT Short Term Goals - 12/14/20 1148       PT SHORT TERM GOAL #1   Title Patient to be independent with initial HEP.    Time 3    Period Weeks    Status Achieved    Target Date 12/25/20               PT Long Term Goals - 12/07/20 0933       PT LONG TERM GOAL #1   Title Patient to be independent with advanced HEP.    Time 8    Period Weeks    Status On-going      PT LONG TERM GOAL #2   Title Patient to demonstrate L UE strength >/=4+/5.    Time 8    Period Weeks    Status On-going      PT LONG TERM GOAL #3   Title Patient to demonstrate L shoulder AROM WFL and without pain limiting.    Time 8    Period Weeks    Status On-going      PT LONG TERM GOAL #4   Title Patient to lift 5lbs overhead with  L UE with good scapular mechanics and no pain.    Time 8  Period Weeks    Status On-going      PT LONG TERM GOAL #5   Title Patient to return to gym with modifications as needed.    Time 8    Period Weeks    Status On-going                   Plan - 12/14/20 1147     Clinical Impression Statement Patient bringing in note from MD stating "ok for strengthening exercises for RTC; continue to focus on ROM." Also received word from MD to focus on flexion and abduction ROM as well as golf specific exercises. Patient tolerated L shoulder jt mobs and PROM for improvement in mobility and ROM. Patient still most limited and flexion and abduction. Initiated mat shoulder AROM with cueing to focus on gentle overhead stretching to tolerance. Initiated isometric RTC strengthening with patient demonstrating quite good ability to activate IR's and ER's, thus proceeded with concentric strengthening. Updated HEP with exercises that were well-tolerated today- patient reported understanding. Patient declined modalities at end of session and without complaints. Patient progressing well towards goals.    Personal Factors and Comorbidities Age;Comorbidity 3+;Past/Current Experience;Time since onset of injury/illness/exacerbation    Comorbidities osteoporosis, HTN, HLD, hyperglycemia, anxiety, anemia, L LE fx surgery 1989, L knee scope 2010, R knee scope 2012    PT Frequency 2x / week    PT Duration 8 weeks    PT Treatment/Interventions ADLs/Self Care Home Management;Cryotherapy;Electrical Stimulation;Iontophoresis 4mg /ml Dexamethasone;Moist Heat;Balance training;Therapeutic exercise;Therapeutic activities;Functional mobility training;Ultrasound;Neuromuscular re-education;Patient/family education;Manual techniques;Vasopneumatic Device;Taping;Energy conservation;Dry needling;Passive range of motion;Scar mobilization    PT Next Visit Plan progress PROM/AAROM/AROM, progress RTC strengthening    Consulted and  Agree with Plan of Care Patient             Patient will benefit from skilled therapeutic intervention in order to improve the following deficits and impairments:  Impaired flexibility, Postural dysfunction, Improper body mechanics, Decreased range of motion, Increased muscle spasms, Pain, Impaired UE functional use, Increased fascial restricitons, Decreased strength, Decreased activity tolerance, Increased edema, Decreased scar mobility, Hypomobility  Visit Diagnosis: Acute pain of left shoulder  Stiffness of left shoulder, not elsewhere classified  Muscle weakness (generalized)  Other muscle spasm     Problem List 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  [place], [place], [address] Phone: [phone]   Fax:  [phone]  Name: [name] MRN: [id number] Date of Birth: Nov 28, 1945

## 2020-12-14 NOTE — Patient Instructions (Signed)
Access Code: [email or web address] URL: [email or web address] Date: 12/14/2020 Prepared by: Grayling Congress  Exercises Seated Shoulder Flexion AAROM with Pulley Behind - 2 x daily - 7 x weekly - 3 min hold Seated Shoulder Abduction AAROM with Pulley Behind - 2 x daily - 7 x weekly - 3 min hold Standing Shoulder Abduction AAROM with Dowel - 1 x daily - 7 x weekly - 2 sets - 10 reps Seated Shoulder External Rotation AAROM with Dowel - 1 x daily - 7 x weekly - 2 sets - 10 reps Supine Shoulder Flexion Extension Full Range AROM - 1 x daily - 7 x weekly - 2 sets - 10 reps Sidelying Shoulder Abduction Palm Forward - 1 x daily - 7 x weekly - 2 sets - 10 reps Sidelying Shoulder External Rotation with Dumbbell - 1 x daily - 7 x weekly - 2 sets - 10 reps Shoulder External Rotation with Anchored Resistance - 1 x daily - 7 x weekly - 2 sets - 10 reps Shoulder Internal Rotation with Resistance - 1 x daily - 7 x weekly - 2 sets - 10 reps

## 2020-12-14 NOTE — Telephone Encounter (Signed)
Medication sent.

## 2020-12-14 NOTE — Progress Notes (Signed)
   Covid-19 Vaccination Clinic  Name:  [name]    MRN: [id number] DOB: [birth year]  12/14/2020  [name] was observed post Covid-19 immunization for 15 minutes without incident. He was provided with Vaccine Information Sheet and instruction to access the V-Safe system.   [name] was instructed to call 911 with any severe reactions post vaccine: Difficulty breathing  Swelling of face and throat  A fast heartbeat  A bad rash all over body  Dizziness and weakness   Immunizations Administered     Name Date Dose VIS Date Route   PFIZER Comrnaty(Gray TOP) Covid-19 Vaccine 12/14/2020 11:58 AM 0.3 mL 06/07/2020 Intramuscular   Manufacturer: [place]   Lot: [id number]   [place]: [phone]

## 2020-12-17 ENCOUNTER — Telehealth: Payer: Self-pay | Admitting: Family Medicine

## 2020-12-17 ENCOUNTER — Other Ambulatory Visit (HOSPITAL_BASED_OUTPATIENT_CLINIC_OR_DEPARTMENT_OTHER): Payer: Self-pay

## 2020-12-17 MED ORDER — COVID-19 MRNA VAC-TRIS(PFIZER) 30 MCG/0.3ML IM SUSP
INTRAMUSCULAR | 0 refills | Status: DC
  Filled 2020-12-17: qty 0.3, 1d supply, fill #0

## 2020-12-17 NOTE — Telephone Encounter (Signed)
Pt came in office stating had picked up at the pharmacy his med for amLODipine (NORVASC) 5 MG tablet, pt wanted provider to know that for his next refill due to please send the request of his meds for amLODipine (NORVASC) 5 MG tablet to [facility]. Please advise.

## 2020-12-17 NOTE — Telephone Encounter (Signed)
FYI- pt will be seeing you in Aug. He would like this medication to continuous through [facility] but we just refilled it for him

## 2020-12-18 ENCOUNTER — Ambulatory Visit: Payer: Medicare Other

## 2020-12-18 ENCOUNTER — Other Ambulatory Visit: Payer: Self-pay

## 2020-12-18 DIAGNOSIS — M6281 Muscle weakness (generalized): Secondary | ICD-10-CM | POA: Diagnosis not present

## 2020-12-18 DIAGNOSIS — M25512 Pain in left shoulder: Secondary | ICD-10-CM

## 2020-12-18 DIAGNOSIS — M25612 Stiffness of left shoulder, not elsewhere classified: Secondary | ICD-10-CM

## 2020-12-18 DIAGNOSIS — M62838 Other muscle spasm: Secondary | ICD-10-CM

## 2020-12-18 NOTE — Therapy (Signed)
[facility] [address]  [place], [place], [address] Phone: [phone]   Fax:  [phone]  Physical Therapy Treatment  Patient Details  Name: [name] MRN: [id number] Date of Birth: [birth year] Referring Provider (PT): Marcene Duos, MD   Encounter Date: 12/18/2020   PT End of Session - 12/18/20 0846     Visit Number 5    Number of Visits 17    Date for PT Re-Evaluation 01/29/21    Authorization Type Medicare & Tricare    PT Start Time 0801    PT Stop Time 0856    PT Time Calculation (min) 55 min    Activity Tolerance Patient tolerated treatment well;Patient limited by pain    Behavior During Therapy [facility] for tasks assessed/performed             Past Medical History:  Diagnosis Date   Abnormal CT scan, gastrointestinal tract suspect angioedema    Anemia 03/16/2016   IRON   Anxiety    Arthritis    Back pain 10/10/2012   Fatty liver 2005   seen on 2005 ultrasound,05/2015 CT.    Hemorrhoids 08/22/2016   Hemorrhoids, internal, with bleeding 2005   internal and external. 2005 band ligation (dr Ferdinand Lango in [facility])   Hyperglycemia 08/21/2013   Hyperlipidemia    Hypertension    Nocturia 08/21/2013   Obesity (BMI 30.0-34.9)    Osteoporosis    Pneumonia    as a child   Rectal bleeding 07/30/2014   Renal cyst    SBO (small bowel obstruction) ([place])    Tubular adenoma of colon before 2005, 2012, 08/2014   Unspecified constipation 02/06/2014   Vitamin D deficiency     Past Surgical History:  Procedure Laterality Date   COLONOSCOPY  before 2005, 2012, 2013, 08/2014.    ENTEROSCOPY N/A 10/05/2017   Procedure: ENTEROSCOPY;  Surgeon: Gatha Mayer, MD;  [facility];  Service: Endoscopy;  Laterality: N/A;   FRACTURE SURGERY Left 1989   leg   HEMORRHOID BANDING  2005   KNEE SURGERY Left 2010   arthroscopy, torn carilage   KNEE SURGERY Right 2012   arthroscopy   TONSILLECTOMY  1957    TONSILLECTOMY     TOTAL SHOULDER ARTHROPLASTY Left 11/01/2020   Procedure: LEFT ANATOMIC SHOULDER REPLACEMENT;  Surgeon: Meredith Pel, MD;  Location: [facility];  Service: Orthopedics;  Laterality: Left;   VENTRAL HERNIA REPAIR N/A 06/03/2018   Procedure: LAPAROSCOPIC VENTRAL WALL  HERNIA  REPAIR WITH MESH  ERAS PATHWAY;  Surgeon: Michael Boston, MD;  [facility];  Service: General;  Laterality: N/A;    There were no vitals filed for this visit.   Subjective Assessment - 12/18/20 0802     Subjective Exercises make the shoulder sore, sometimes can feel the "piece in there".    Pertinent History osteoporosis, HTN, HLD, hyperglycemia, anxiety, anemia, L LE fx surgery 1989, L knee scope 2010, R knee scope 2012    Diagnostic tests none recent    Patient Stated Goals decrease pain, improve motion    Currently in Pain? Yes    Pain Score 3     Pain Location Shoulder    Pain Orientation Left;Anterior;Lateral    Pain Descriptors / Indicators Aching;Sore    Pain Type Acute pain;Chronic pain                [facility] PT Assessment - 12/18/20 0001       AROM  Right/Left Shoulder Left    Left Shoulder Flexion 120 Degrees    Left Shoulder ABduction 125 Degrees    Left Shoulder External Rotation 15 Degrees                           [facility] Adult PT Treatment/Exercise - 12/18/20 0001       Exercises   Exercises Shoulder      Shoulder Exercises: Seated   Flexion AROM;Left;10 reps    Flexion Limitations to tolerance    Abduction AROM;Left;10 reps    ABduction Limitations cues to depress shoulders; to tolerance      Shoulder Exercises: Standing   External Rotation Strengthening;Left;10 reps;Theraband    Theraband Level (Shoulder External Rotation) Level 1 (Yellow)    External Rotation Limitations isometrics with shoulder in neutral with towel under elbow    Internal Rotation Strengthening;Left;10 reps;Theraband    Theraband Level (Shoulder Internal Rotation) Level 1  (Yellow)    Internal Rotation Limitations isometrics with shoulder in neutral with towel under elbow    Row Strengthening;Both;Theraband;15 reps    Theraband Level (Shoulder Row) Level 2 (Red)    Row Limitations cues to pull elbows to nuetral      Shoulder Exercises: Pulleys   Flexion 3 minutes    Flexion Limitations to tolerance    Scaption 3 minutes    Scaption Limitations to tolerance      Shoulder Exercises: Therapy Ball   Flexion Both;10 reps    Flexion Limitations orange pball seated rollout; 10x3"                      PT Short Term Goals - 12/14/20 1148       PT SHORT TERM GOAL #1   Title Patient to be independent with initial HEP.    Time 3    Period Weeks    Status Achieved    Target Date 12/25/20               PT Long Term Goals - 12/18/20 0955       PT LONG TERM GOAL #1   Title Patient to be independent with advanced HEP.    Time 8    Period Weeks    Status On-going      PT LONG TERM GOAL #2   Title Patient to demonstrate L UE strength >/=4+/5.    Time 8    Period Weeks    Status On-going      PT LONG TERM GOAL #3   Title Patient to demonstrate L shoulder AROM WFL and without pain limiting.    Time 8    Period Weeks    Status On-going   slight pain with end ranges     PT LONG TERM GOAL #4   Title Patient to lift 5lbs overhead with L UE with good scapular mechanics and no pain.    Time 8    Period Weeks    Status On-going      PT LONG TERM GOAL #5   Title Patient to return to gym with modifications as needed.    Time 8    Period Weeks    Status On-going                   Plan - 12/18/20 0847     Clinical Impression Statement Pt responded well to the exercises today. He was able to initiate seated shoulder AROM with minimal  pain. He does report soreness along the anterior portion of the shoulder with end range OH motions. Cues required during the resisted iso ER/IR to keep neutral positioning of the L shoulder. Also  cues given to keep L shoulder depressed during OH movements. Pt shows decent L shoulder AROM as of today but still considerable improvement can be made. He is showing good progress so far with goals.    Personal Factors and Comorbidities Age;Comorbidity 3+;Past/Current Experience;Time since onset of injury/illness/exacerbation    Comorbidities osteoporosis, HTN, HLD, hyperglycemia, anxiety, anemia, L LE fx surgery 1989, L knee scope 2010, R knee scope 2012    PT Frequency 2x / week    PT Duration 8 weeks    PT Treatment/Interventions ADLs/Self Care Home Management;Cryotherapy;Electrical Stimulation;Iontophoresis 4mg /ml Dexamethasone;Moist Heat;Balance training;Therapeutic exercise;Therapeutic activities;Functional mobility training;Ultrasound;Neuromuscular re-education;Patient/family education;Manual techniques;Vasopneumatic Device;Taping;Energy conservation;Dry needling;Passive range of motion;Scar mobilization    PT Next Visit Plan progress PROM/AAROM/AROM, progress RTC strengthening    Consulted and Agree with Plan of Care Patient             Patient will benefit from skilled therapeutic intervention in order to improve the following deficits and impairments:  Impaired flexibility, Postural dysfunction, Improper body mechanics, Decreased range of motion, Increased muscle spasms, Pain, Impaired UE functional use, Increased fascial restricitons, Decreased strength, Decreased activity tolerance, Increased edema, Decreased scar mobility, Hypomobility  Visit Diagnosis: Acute pain of left shoulder  Stiffness of left shoulder, not elsewhere classified  Muscle weakness (generalized)  Other muscle spasm     Problem List 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  [place], [place], [address] Phone: [phone]   Fax:  [phone]  Name: [name] MRN: [id number] Date of Birth: 1946-06-12

## 2020-12-21 ENCOUNTER — Ambulatory Visit: Payer: Medicare Other | Admitting: Physical Therapy

## 2020-12-21 ENCOUNTER — Encounter: Payer: Self-pay | Admitting: Physical Therapy

## 2020-12-21 ENCOUNTER — Other Ambulatory Visit: Payer: Self-pay

## 2020-12-21 DIAGNOSIS — M25612 Stiffness of left shoulder, not elsewhere classified: Secondary | ICD-10-CM

## 2020-12-21 DIAGNOSIS — M6281 Muscle weakness (generalized): Secondary | ICD-10-CM | POA: Diagnosis not present

## 2020-12-21 DIAGNOSIS — M25512 Pain in left shoulder: Secondary | ICD-10-CM | POA: Diagnosis not present

## 2020-12-21 DIAGNOSIS — M62838 Other muscle spasm: Secondary | ICD-10-CM | POA: Diagnosis not present

## 2020-12-21 NOTE — Therapy (Signed)
[facility] [address]  [place], [place], [address] Phone: [phone]   Fax:  [phone]  Physical Therapy Treatment  Patient Details  Name: [name] MRN: [id number] Date of Birth: October 01, 1945 Referring Provider (PT): Marcene Duos, MD   Encounter Date: 12/21/2020   PT End of Session - 12/21/20 1142     Visit Number 6    Number of Visits 17    Date for PT Re-Evaluation 01/29/21    Authorization Type Medicare & Tricare    PT Start Time 1054    PT Stop Time 1148   moist heat   PT Time Calculation (min) 54 min    Activity Tolerance Patient tolerated treatment well    Behavior During Therapy [facility] for tasks assessed/performed             Past Medical History:  Diagnosis Date   Abnormal CT scan, gastrointestinal tract suspect angioedema    Anemia 03/16/2016   IRON   Anxiety    Arthritis    Back pain 10/10/2012   Fatty liver 2005   seen on 2005 ultrasound,05/2015 CT.    Hemorrhoids 08/22/2016   Hemorrhoids, internal, with bleeding 2005   internal and external. 2005 band ligation (dr Ferdinand Lango in [facility])   Hyperglycemia 08/21/2013   Hyperlipidemia    Hypertension    Nocturia 08/21/2013   Obesity (BMI 30.0-34.9)    Osteoporosis    Pneumonia    as a child   Rectal bleeding 07/30/2014   Renal cyst    SBO (small bowel obstruction) ([place])    Tubular adenoma of colon before 2005, 2012, 08/2014   Unspecified constipation 02/06/2014   Vitamin D deficiency     Past Surgical History:  Procedure Laterality Date   COLONOSCOPY  before 2005, 2012, 2013, 08/2014.    ENTEROSCOPY N/A 10/05/2017   Procedure: ENTEROSCOPY;  Surgeon: Gatha Mayer, MD;  Location: [facility];  Service: Endoscopy;  Laterality: N/A;   FRACTURE SURGERY Left 1989   leg   HEMORRHOID BANDING  2005   KNEE SURGERY Left 2010   arthroscopy, torn carilage   KNEE SURGERY Right 2012   arthroscopy   TONSILLECTOMY  1957   TONSILLECTOMY      TOTAL SHOULDER ARTHROPLASTY Left 11/01/2020   Procedure: LEFT ANATOMIC SHOULDER REPLACEMENT;  Surgeon: Meredith Pel, MD;  Location: [place];  Service: Orthopedics;  Laterality: Left;   VENTRAL HERNIA REPAIR N/A 06/03/2018   Procedure: LAPAROSCOPIC VENTRAL WALL  HERNIA  REPAIR WITH MESH  ERAS PATHWAY;  Surgeon: Michael Boston, MD;  Location: [facility];  Service: General;  Laterality: N/A;    There were no vitals filed for this visit.   Subjective Assessment - 12/21/20 1055     Subjective Has been doing 2 sets of his exercises and is experiencing soreness. Feels like he needs to back off.    Pertinent History osteoporosis, HTN, HLD, hyperglycemia, anxiety, anemia, L LE fx surgery 1989, L knee scope 2010, R knee scope 2012    Diagnostic tests none recent    Patient Stated Goals decrease pain, improve motion    Currently in Pain? Yes    Pain Score 4     Pain Location Shoulder    Pain Orientation Left;Anterior;Lateral    Pain Descriptors / Indicators Sore    Pain Type Acute pain  [facility] Adult PT Treatment/Exercise - 12/21/20 0001       Shoulder Exercises: Supine   Protraction Strengthening;Both;10 reps    Protraction Weight (lbs) 2    Protraction Limitations manual cues for proper form    Flexion AROM;Strengthening;Left;5 reps    Shoulder Flexion Weight (lbs) 1    Flexion Limitations thumb up; focusing on gentle stretch OH      Shoulder Exercises: Sidelying   External Rotation AROM;Left;10 reps    External Rotation Weight (lbs) 1    External Rotation Limitations shoulder in neutral; ROM to tolerance   cues to avoid elbow compensation   ABduction AROM;Strengthening;Left;5 reps    ABduction Limitations thumb up; 5x 0#, 5x 1#   cues to avoid trunk rotation back     Shoulder Exercises: Standing   Other Standing Exercises serratus punch at wall 5x   heavy cueing     Shoulder Exercises: Pulleys   Flexion 3 minutes    Flexion Limitations  to tolerance    Scaption 3 minutes    Scaption Limitations to tolerance      Modalities   Modalities Moist Heat      Moist Heat Therapy   Number Minutes Moist Heat 10 Minutes    Moist Heat Location Shoulder   L     Manual Therapy   Manual Therapy Joint mobilization;Passive ROM;Muscle Energy Technique    Manual therapy comments supine    Joint Mobilization L GH AP/PA/inferior and distraction jt mobs grade III/IV    Soft tissue mobilization STM to L proximal biceps, anterior and lateral delt    Myofascial Release manual TPR to L anterior and lateral delt   palpable TTP trigger points with good twitch response and relief   Passive ROM L shoulder PROM flexion, abduction, ER pain-free with prolonged holds at end range    Muscle Energy Technique L IR MET 5x5" followed by ER PROM 5x15"; L shoulder extension MET 5x5" at 90 deg flexion followed by flexion PROM 5x15"                    PT Education - 12/21/20 1141     Education Details update to HEP; edu on use of heat for muscular tension; advised to follow HEP as prescribed    Person(s) Educated Patient    Methods Explanation;Demonstration;Tactile cues;Verbal cues;Handout    Comprehension Verbalized understanding;Returned demonstration              PT Short Term Goals - 12/14/20 1148       PT SHORT TERM GOAL #1   Title Patient to be independent with initial HEP.    Time 3    Period Weeks    Status Achieved    Target Date 12/25/20               PT Long Term Goals - 12/18/20 0955       PT LONG TERM GOAL #1   Title Patient to be independent with advanced HEP.    Time 8    Period Weeks    Status On-going      PT LONG TERM GOAL #2   Title Patient to demonstrate L UE strength >/=4+/5.    Time 8    Period Weeks    Status On-going      PT LONG TERM GOAL #3   Title Patient to demonstrate L shoulder AROM WFL and without pain limiting.    Time 8    Period Weeks  Status On-going   slight pain with end  ranges     PT LONG TERM GOAL #4   Title Patient to lift 5lbs overhead with L UE with good scapular mechanics and no pain.    Time 8    Period Weeks    Status On-going      PT LONG TERM GOAL #5   Title Patient to return to gym with modifications as needed.    Time 8    Period Weeks    Status On-going                   Plan - 12/21/20 1142     Clinical Impression Statement Patient arrived to session with report of soreness in the L shoulder after performing more than the prescribed HEP reps. Advised patient to stick to prescribed frequency and reps to avoid injury. Patient agreeable.Patient tolerated L shoulder PROM, jt mobs, and MET for improvement in ROM. Patient remarked on improved mobility after MT. Initiated light weight with supine and sidelying shoulder ther-ex for improvement in strength and mobility with good overall tolerance. Initiated serratus strengthening with patient demonstrating difficult with proper movement pattern in standing- more success in supine. Updated this into HEP. Patient with report of L anterior shoulder soreness at end of session, which was addressed with STM and manual TPR with palpable TTP trigger points in the anterior and lateral deltoid evident. Good improvement in tissue pliability after STM. Ended session with moist heat to L anterior shoulder for muscular tension. No complaints at end of session.    Personal Factors and Comorbidities Age;Comorbidity 3+;Past/Current Experience;Time since onset of injury/illness/exacerbation    Comorbidities osteoporosis, HTN, HLD, hyperglycemia, anxiety, anemia, L LE fx surgery 1989, L knee scope 2010, R knee scope 2012    PT Frequency 2x / week    PT Duration 8 weeks    PT Treatment/Interventions ADLs/Self Care Home Management;Cryotherapy;Electrical Stimulation;Iontophoresis 73m/ml Dexamethasone;Moist Heat;Balance training;Therapeutic exercise;Therapeutic activities;Functional mobility  training;Ultrasound;Neuromuscular re-education;Patient/family education;Manual techniques;Vasopneumatic Device;Taping;Energy conservation;Dry needling;Passive range of motion;Scar mobilization    PT Next Visit Plan progress PROM/AAROM/AROM, progress RTC and serratus strengthening    Consulted and Agree with Plan of Care Patient             Patient will benefit from skilled therapeutic intervention in order to improve the following deficits and impairments:  Impaired flexibility, Postural dysfunction, Improper body mechanics, Decreased range of motion, Increased muscle spasms, Pain, Impaired UE functional use, Increased fascial restricitons, Decreased strength, Decreased activity tolerance, Increased edema, Decreased scar mobility, Hypomobility  Visit Diagnosis: Acute pain of left shoulder  Stiffness of left shoulder, not elsewhere classified  Muscle weakness (generalized)  Other muscle spasm     Problem List 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   Skin tags, multiple acquired 07/07/2019   LUQ pain 07/07/2019   Primary osteoarthritis of right knee 03/16/2019   Ventral hernia s/p lap repair with mesh 06/03/2018 06/03/2018   Hemorrhoids 08/22/2016   Anemia 03/16/2016   Right shoulder pain 02/26/2016   Abnormal CT scan, gastrointestinal tract suspect angioedema    Abdominal pain    Hyperglycemia 08/21/2013   Low back pain 10/10/2012   Hyperlipidemia    Hypertension, essential     History of colon polyps    Obesity (BMI 30.0-34.9)       YJanene Harvey PT, DPT 12/21/20 11:50 AM   [facility] [address]  [place], [place], [address] Phone: [phone]   Fax:  [phone]  Name: [name] MRN: [id number] Date of Birth: 1946/06/21

## 2020-12-24 ENCOUNTER — Other Ambulatory Visit: Payer: Self-pay | Admitting: Family Medicine

## 2020-12-25 ENCOUNTER — Ambulatory Visit: Payer: Medicare Other

## 2020-12-25 ENCOUNTER — Other Ambulatory Visit: Payer: Self-pay

## 2020-12-25 DIAGNOSIS — M62838 Other muscle spasm: Secondary | ICD-10-CM | POA: Diagnosis not present

## 2020-12-25 DIAGNOSIS — M25612 Stiffness of left shoulder, not elsewhere classified: Secondary | ICD-10-CM | POA: Diagnosis not present

## 2020-12-25 DIAGNOSIS — M6281 Muscle weakness (generalized): Secondary | ICD-10-CM

## 2020-12-25 DIAGNOSIS — M25512 Pain in left shoulder: Secondary | ICD-10-CM | POA: Diagnosis not present

## 2020-12-25 NOTE — Therapy (Signed)
[facility] [address]  [place], [place], [address] Phone: [phone]   Fax:  [phone]  Physical Therapy Treatment  Patient Details  Name: [name] MRN: [id number] Date of Birth: 1946/05/21 Referring Provider (PT): Marcene Duos, MD   Encounter Date: 12/25/2020   PT End of Session - 12/25/20 0848     Visit Number 7    Number of Visits 17    Date for PT Re-Evaluation 01/29/21    Authorization Type Medicare & Tricare    PT Start Time 0801    PT Stop Time 1771    PT Time Calculation (min) 57 min    Activity Tolerance Patient tolerated treatment well    Behavior During Therapy [facility] for tasks assessed/performed             Past Medical History:  Diagnosis Date   Abnormal CT scan, gastrointestinal tract suspect angioedema    Anemia 03/16/2016   IRON   Anxiety    Arthritis    Back pain 10/10/2012   Fatty liver 2005   seen on 2005 ultrasound,05/2015 CT.    Hemorrhoids 08/22/2016   Hemorrhoids, internal, with bleeding 2005   internal and external. 2005 band ligation (dr Ferdinand Lango in [facility])   Hyperglycemia 08/21/2013   Hyperlipidemia    Hypertension    Nocturia 08/21/2013   Obesity (BMI 30.0-34.9)    Osteoporosis    Pneumonia    as a child   Rectal bleeding 07/30/2014   Renal cyst    SBO (small bowel obstruction) ([place])    Tubular adenoma of colon before 2005, 2012, 08/2014   Unspecified constipation 02/06/2014   Vitamin D deficiency     Past Surgical History:  Procedure Laterality Date   COLONOSCOPY  before 2005, 2012, 2013, 08/2014.    ENTEROSCOPY N/A 10/05/2017   Procedure: ENTEROSCOPY;  Surgeon: Gatha Mayer, MD;  [facility];  Service: Endoscopy;  Laterality: N/A;   FRACTURE SURGERY Left 1989   leg   HEMORRHOID BANDING  2005   KNEE SURGERY Left 2010   arthroscopy, torn carilage   KNEE SURGERY Right 2012   arthroscopy   TONSILLECTOMY  1957   TONSILLECTOMY     TOTAL  SHOULDER ARTHROPLASTY Left 11/01/2020   Procedure: LEFT ANATOMIC SHOULDER REPLACEMENT;  Surgeon: Meredith Pel, MD;  Location: [place];  Service: Orthopedics;  Laterality: Left;   VENTRAL HERNIA REPAIR N/A 06/03/2018   Procedure: LAPAROSCOPIC VENTRAL WALL  HERNIA  REPAIR WITH MESH  ERAS PATHWAY;  Surgeon: Michael Boston, MD;  [facility];  Service: General;  Laterality: N/A;    There were no vitals filed for this visit.   Subjective Assessment - 12/25/20 0803     Subjective Shoulder is a little stiff this morning    Pertinent History osteoporosis, HTN, HLD, hyperglycemia, anxiety, anemia, L LE fx surgery 1989, L knee scope 2010, R knee scope 2012    Diagnostic tests none recent    Patient Stated Goals decrease pain, improve motion    Currently in Pain? Yes    Pain Score 3     Pain Location Shoulder    Pain Orientation Left;Anterior    Pain Descriptors / Indicators Sore;Throbbing    Pain Type Acute pain                               [facility] Adult PT Treatment/Exercise - 12/25/20 0001  Exercises   Exercises Shoulder      Shoulder Exercises: Supine   Flexion AROM;Left;10 reps;Weights    Shoulder Flexion Weight (lbs) 1    Flexion Limitations thumb up; focusing on gentle stretch OH    Other Supine Exercises scaption with 1# weight L UE with holds into OH position; 10 reps      Shoulder Exercises: Standing   External Rotation Strengthening;Left;10 reps;Theraband    Theraband Level (Shoulder External Rotation) Level 2 (Red)    External Rotation Limitations isometrics with shoulder in neutral; 3" holds   instructions needed to keep 90 deg angle at elbow to emphasize RTC   Internal Rotation Strengthening;Left;10 reps;Theraband    Theraband Level (Shoulder Internal Rotation) Level 2 (Red)    Internal Rotation Limitations isometrics with shoulder in neutral; 3" holds    Other Standing Exercises golf swing simulation with SPC 10 reps      Shoulder  Exercises: Pulleys   Flexion 3 minutes    Flexion Limitations to tolerance with 5" hold at top    Scaption 3 minutes    Scaption Limitations to tolerance with 5" holds      Shoulder Exercises: Therapy Ball   Flexion Both;10 reps    Flexion Limitations orange pball seated rollout; 10x3"    Scaption Right;10 reps    Scaption Limitations orange pball rollout 10x3"      Modalities   Modalities Vasopneumatic      Moist Heat Therapy   Number Minutes Moist Heat 10 Minutes    Moist Heat Location Shoulder      Manual Therapy   Manual Therapy Joint mobilization;Passive ROM    Manual therapy comments supine    Joint Mobilization L GH AP/PA/inferior and distraction jt mobs grade III/IV    Passive ROM L shoulder PROM flexion, abduction, ER pain-free with prolonged holds at end range                      PT Short Term Goals - 12/14/20 1148       PT SHORT TERM GOAL #1   Title Patient to be independent with initial HEP.    Time 3    Period Weeks    Status Achieved    Target Date 12/25/20               PT Long Term Goals - 12/18/20 0955       PT LONG TERM GOAL #1   Title Patient to be independent with advanced HEP.    Time 8    Period Weeks    Status On-going      PT LONG TERM GOAL #2   Title Patient to demonstrate L UE strength >/=4+/5.    Time 8    Period Weeks    Status On-going      PT LONG TERM GOAL #3   Title Patient to demonstrate L shoulder AROM WFL and without pain limiting.    Time 8    Period Weeks    Status On-going   slight pain with end ranges     PT LONG TERM GOAL #4   Title Patient to lift 5lbs overhead with L UE with good scapular mechanics and no pain.    Time 8    Period Weeks    Status On-going      PT LONG TERM GOAL #5   Title Patient to return to gym with modifications as needed.    Time 8    Period  Weeks    Status On-going                   Plan - 12/25/20 0857     Clinical Impression Statement Pt shows good  progress. Progressed RTC isometrics with increased resistance but required instruction to increase tension on RTC muscles. Initiated golf swings with SPC today, he reported a little pain with crossbody motion with the L shoulder but tolerable. L shoulder capsule demonstrates tightness with PA glides and inf glides but he demonstrated increases in ROM post joint mobs. Ended session with vaso for soreness and swelling. He responded well to treatment.    Personal Factors and Comorbidities Age;Comorbidity 3+;Past/Current Experience;Time since onset of injury/illness/exacerbation    Comorbidities osteoporosis, HTN, HLD, hyperglycemia, anxiety, anemia, L LE fx surgery 1989, L knee scope 2010, R knee scope 2012    PT Frequency 2x / week    PT Duration 8 weeks    PT Treatment/Interventions ADLs/Self Care Home Management;Cryotherapy;Electrical Stimulation;Iontophoresis 4mg /ml Dexamethasone;Moist Heat;Balance training;Therapeutic exercise;Therapeutic activities;Functional mobility training;Ultrasound;Neuromuscular re-education;Patient/family education;Manual techniques;Vasopneumatic Device;Taping;Energy conservation;Dry needling;Passive range of motion;Scar mobilization    PT Next Visit Plan progress PROM/AAROM/AROM, progress RTC and serratus strengthening    Consulted and Agree with Plan of Care Patient             Patient will benefit from skilled therapeutic intervention in order to improve the following deficits and impairments:  Impaired flexibility, Postural dysfunction, Improper body mechanics, Decreased range of motion, Increased muscle spasms, Pain, Impaired UE functional use, Increased fascial restricitons, Decreased strength, Decreased activity tolerance, Increased edema, Decreased scar mobility, Hypomobility  Visit Diagnosis: Acute pain of left shoulder  Stiffness of left shoulder, not elsewhere classified  Muscle weakness (generalized)  Other muscle spasm     Problem List Patient  Active Problem List   Diagnosis Date Noted   Shoulder arthritis    S/P shoulder replacement, 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  [place], [place], [address] Phone: [phone]   Fax:  [phone]  Name: [name] MRN: [id number] Date of Birth: 1945-12-15

## 2020-12-28 ENCOUNTER — Other Ambulatory Visit: Payer: Self-pay

## 2020-12-28 ENCOUNTER — Ambulatory Visit: Payer: Medicare Other | Attending: Orthopedic Surgery

## 2020-12-28 DIAGNOSIS — M6281 Muscle weakness (generalized): Secondary | ICD-10-CM | POA: Diagnosis not present

## 2020-12-28 DIAGNOSIS — M25612 Stiffness of left shoulder, not elsewhere classified: Secondary | ICD-10-CM

## 2020-12-28 DIAGNOSIS — M25512 Pain in left shoulder: Secondary | ICD-10-CM | POA: Diagnosis not present

## 2020-12-28 DIAGNOSIS — M62838 Other muscle spasm: Secondary | ICD-10-CM

## 2020-12-28 NOTE — Therapy (Signed)
[facility] [address]  [place], [place], [address] Phone: [phone]   Fax:  [phone]  Physical Therapy Treatment  Patient Details  Name: [name] MRN: [id number] Date of Birth: 02-Sep-1945 Referring Provider (PT): Marcene Duos, MD   Encounter Date: 12/28/2020   PT End of Session - 12/28/20 1147     Visit Number 8    Number of Visits 17    Date for PT Re-Evaluation 01/29/21    Authorization Type Medicare & Tricare    PT Start Time 5176    PT Stop Time 1607    PT Time Calculation (min) 54 min    Activity Tolerance Patient tolerated treatment well    Behavior During Therapy [facility] for tasks assessed/performed             Past Medical History:  Diagnosis Date   Abnormal CT scan, gastrointestinal tract suspect angioedema    Anemia 03/16/2016   IRON   Anxiety    Arthritis    Back pain 10/10/2012   Fatty liver 2005   seen on 2005 ultrasound,05/2015 CT.    Hemorrhoids 08/22/2016   Hemorrhoids, internal, with bleeding 2005   internal and external. 2005 band ligation (dr Ferdinand Lango in [facility])   Hyperglycemia 08/21/2013   Hyperlipidemia    Hypertension    Nocturia 08/21/2013   Obesity (BMI 30.0-34.9)    Osteoporosis    Pneumonia    as a child   Rectal bleeding 07/30/2014   Renal cyst    SBO (small bowel obstruction) ([place])    Tubular adenoma of colon before 2005, 2012, 08/2014   Unspecified constipation 02/06/2014   Vitamin D deficiency     Past Surgical History:  Procedure Laterality Date   COLONOSCOPY  before 2005, 2012, 2013, 08/2014.    ENTEROSCOPY N/A 10/05/2017   Procedure: ENTEROSCOPY;  Surgeon: Gatha Mayer, MD;  Location: [facility];  Service: Endoscopy;  Laterality: N/A;   FRACTURE SURGERY Left 1989   leg   HEMORRHOID BANDING  2005   KNEE SURGERY Left 2010   arthroscopy, torn carilage   KNEE SURGERY Right 2012   arthroscopy   TONSILLECTOMY  1957   TONSILLECTOMY     TOTAL SHOULDER  ARTHROPLASTY Left 11/01/2020   Procedure: LEFT ANATOMIC SHOULDER REPLACEMENT;  Surgeon: Meredith Pel, MD;  Location: [place];  Service: Orthopedics;  Laterality: Left;   VENTRAL HERNIA REPAIR N/A 06/03/2018   Procedure: LAPAROSCOPIC VENTRAL WALL  HERNIA  REPAIR WITH MESH  ERAS PATHWAY;  Surgeon: Michael Boston, MD;  Location: [facility];  Service: General;  Laterality: N/A;    There were no vitals filed for this visit.   Subjective Assessment - 12/28/20 1110     Subjective Had pain on yesterday but feeling good today. A little stiff.    Pertinent History osteoporosis, HTN, HLD, hyperglycemia, anxiety, anemia, L LE fx surgery 1989, L knee scope 2010, R knee scope 2012    Diagnostic tests none recent    Patient Stated Goals decrease pain, improve motion    Currently in Pain? No/denies                [facility] PT Assessment - 12/28/20 0001       AROM   Right/Left Shoulder Left    Left Shoulder Flexion --   89 in seated; 130 in supine   Left Shoulder ABduction --   108 in seated; 132 in supine  [facility] Adult PT Treatment/Exercise - 12/28/20 0001       Shoulder Exercises: Seated   Flexion AAROM;Both;10 reps    Flexion Limitations with wand to tolerance    Other Seated Exercises B flexion and abduction stretch with orange pball 5x10"      Shoulder Exercises: Pulleys   Flexion 3 minutes    Flexion Limitations to tolerance with 5" hold at top    Scaption 3 minutes    Scaption Limitations to tolerance with 5" holds      Shoulder Exercises: ROM/Strengthening   Wall Wash B UE into flexion, L UE circles both ways 10 reps each      Modalities   Modalities Vasopneumatic      Moist Heat Therapy   Number Minutes Moist Heat 10 Minutes    Moist Heat Location Shoulder      Manual Therapy   Manual Therapy Passive ROM;Soft tissue mobilization;Myofascial release    Manual therapy comments supine    Soft tissue mobilization STM to L triceps     Myofascial Release manual TPR to L triceps                      PT Short Term Goals - 12/14/20 1148       PT SHORT TERM GOAL #1   Title Patient to be independent with initial HEP.    Time 3    Period Weeks    Status Achieved    Target Date 12/25/20               PT Long Term Goals - 12/18/20 0955       PT LONG TERM GOAL #1   Title Patient to be independent with advanced HEP.    Time 8    Period Weeks    Status On-going      PT LONG TERM GOAL #2   Title Patient to demonstrate L UE strength >/=4+/5.    Time 8    Period Weeks    Status On-going      PT LONG TERM GOAL #3   Title Patient to demonstrate L shoulder AROM WFL and without pain limiting.    Time 8    Period Weeks    Status On-going   slight pain with end ranges     PT LONG TERM GOAL #4   Title Patient to lift 5lbs overhead with L UE with good scapular mechanics and no pain.    Time 8    Period Weeks    Status On-going      PT LONG TERM GOAL #5   Title Patient to return to gym with modifications as needed.    Time 8    Period Weeks    Status On-going                   Plan - 12/28/20 1200     Clinical Impression Statement Pt demonstrated increased stiffness prior to the session. Progressed ROM exercises today to patient's tolerance. He reported only mild pain with the end range OH motions. He showed increases in flexion and abduction AROM in supine but still considerable limited in upright positions. He noted tenderness and soreness along his L tricep muscles today, so addressed this with STM.  Post exercise he demonstrated increases on ROM and smoother, fluid motion. Ended session with vaso for swelling and pain post session.    Personal Factors and Comorbidities Age;Comorbidity 3+;Past/Current Experience;Time since onset of injury/illness/exacerbation  Comorbidities osteoporosis, HTN, HLD, hyperglycemia, anxiety, anemia, L LE fx surgery 1989, L knee scope 2010, R knee scope  2012    PT Frequency 2x / week    PT Duration 8 weeks    PT Treatment/Interventions ADLs/Self Care Home Management;Cryotherapy;Electrical Stimulation;Iontophoresis 4mg /ml Dexamethasone;Moist Heat;Balance training;Therapeutic exercise;Therapeutic activities;Functional mobility training;Ultrasound;Neuromuscular re-education;Patient/family education;Manual techniques;Vasopneumatic Device;Taping;Energy conservation;Dry needling;Passive range of motion;Scar mobilization    PT Next Visit Plan progress PROM/AAROM/AROM, progress RTC and serratus strengthening    Consulted and Agree with Plan of Care Patient             Patient will benefit from skilled therapeutic intervention in order to improve the following deficits and impairments:  Impaired flexibility, Postural dysfunction, Improper body mechanics, Decreased range of motion, Increased muscle spasms, Pain, Impaired UE functional use, Increased fascial restricitons, Decreased strength, Decreased activity tolerance, Increased edema, Decreased scar mobility, Hypomobility  Visit Diagnosis: Acute pain of left shoulder  Stiffness of left shoulder, not elsewhere classified  Muscle weakness (generalized)  Other muscle spasm     Problem List 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   Skin tags, multiple acquired 07/07/2019   LUQ pain 07/07/2019   Primary osteoarthritis of right knee 03/16/2019   Ventral hernia s/p lap repair with mesh 06/03/2018 06/03/2018   Hemorrhoids 08/22/2016   Anemia 03/16/2016   Right shoulder pain 02/26/2016   Abnormal CT scan, gastrointestinal tract suspect angioedema    Abdominal pain    Hyperglycemia 08/21/2013   Low back pain 10/10/2012   Hyperlipidemia    Hypertension, essential     History of colon polyps    Obesity (BMI 30.0-34.9)     Artist Pais, PTA 12/28/2020, 12:06 PM  [facility] [address]  [place], [place], [address] Phone: [phone]   Fax:  [phone]  Name: Shadee Montoya MRN: [id number] Date of Birth: May 30, 1946

## 2021-01-01 ENCOUNTER — Ambulatory Visit: Payer: Medicare Other

## 2021-01-01 ENCOUNTER — Telehealth: Payer: Self-pay | Admitting: *Deleted

## 2021-01-01 ENCOUNTER — Other Ambulatory Visit: Payer: Self-pay

## 2021-01-01 DIAGNOSIS — M6281 Muscle weakness (generalized): Secondary | ICD-10-CM

## 2021-01-01 DIAGNOSIS — M62838 Other muscle spasm: Secondary | ICD-10-CM

## 2021-01-01 DIAGNOSIS — M25612 Stiffness of left shoulder, not elsewhere classified: Secondary | ICD-10-CM | POA: Diagnosis not present

## 2021-01-01 DIAGNOSIS — M25512 Pain in left shoulder: Secondary | ICD-10-CM

## 2021-01-01 NOTE — Therapy (Signed)
[facility] [address]  [place], [place], [address] Phone: [phone]   Fax:  [phone]  Physical Therapy Treatment  Patient Details  Name: [name] MRN: [id number] Date of Birth: August 31, 1945 Referring Provider (PT): Marcene Duos, MD   Encounter Date: 01/01/2021   PT End of Session - 01/01/21 0842     Visit Number 9    Number of Visits 17    Date for PT Re-Evaluation 01/29/21    Authorization Type Medicare & Tricare    PT Start Time 0800    PT Stop Time 0853    PT Time Calculation (min) 53 min    Activity Tolerance Patient tolerated treatment well    Behavior During Therapy [facility] for tasks assessed/performed             Past Medical History:  Diagnosis Date   Abnormal CT scan, gastrointestinal tract suspect angioedema    Anemia 03/16/2016   IRON   Anxiety    Arthritis    Back pain 10/10/2012   Fatty liver 2005   seen on 2005 ultrasound,05/2015 CT.    Hemorrhoids 08/22/2016   Hemorrhoids, internal, with bleeding 2005   internal and external. 2005 band ligation (dr Ferdinand Lango in [facility])   Hyperglycemia 08/21/2013   Hyperlipidemia    Hypertension    Nocturia 08/21/2013   Obesity (BMI 30.0-34.9)    Osteoporosis    Pneumonia    as a child   Rectal bleeding 07/30/2014   Renal cyst    SBO (small bowel obstruction) ([place])    Tubular adenoma of colon before 2005, 2012, 08/2014   Unspecified constipation 02/06/2014   Vitamin D deficiency     Past Surgical History:  Procedure Laterality Date   COLONOSCOPY  before 2005, 2012, 2013, 08/2014.    ENTEROSCOPY N/A 10/05/2017   Procedure: ENTEROSCOPY;  Surgeon: Gatha Mayer, MD;  [facility];  Service: Endoscopy;  Laterality: N/A;   FRACTURE SURGERY Left 1989   leg   HEMORRHOID BANDING  2005   KNEE SURGERY Left 2010   arthroscopy, torn carilage   KNEE SURGERY Right 2012   arthroscopy   TONSILLECTOMY  1957   TONSILLECTOMY     TOTAL SHOULDER  ARTHROPLASTY Left 11/01/2020   Procedure: LEFT ANATOMIC SHOULDER REPLACEMENT;  Surgeon: Meredith Pel, MD;  Location: [place];  Service: Orthopedics;  Laterality: Left;   VENTRAL HERNIA REPAIR N/A 06/03/2018   Procedure: LAPAROSCOPIC VENTRAL WALL  HERNIA  REPAIR WITH MESH  ERAS PATHWAY;  Surgeon: Michael Boston, MD;  [facility];  Service: General;  Laterality: N/A;    There were no vitals filed for this visit.   Subjective Assessment - 01/01/21 0801     Subjective Pt notes he has been doing his exercises and a little sore from it today.    Pertinent History osteoporosis, HTN, HLD, hyperglycemia, anxiety, anemia, L LE fx surgery 1989, L knee scope 2010, R knee scope 2012    Diagnostic tests none recent    Patient Stated Goals decrease pain, improve motion    Currently in Pain? No/denies                               [facility] Adult PT Treatment/Exercise - 01/01/21 0001       Exercises   Exercises Shoulder      Shoulder Exercises: Seated   Other Seated Exercises serratus slides  with pillowcase 15 reps on elevated mat table    Other Seated Exercises table slides going into scaption 10 reps      Shoulder Exercises: Standing   External Rotation Strengthening;Left;10 reps;Theraband    Theraband Level (Shoulder External Rotation) Level 1 (Yellow)    External Rotation Limitations towel between elbow    Internal Rotation Strengthening;Left;10 reps;Theraband    Theraband Level (Shoulder Internal Rotation) Level 1 (Yellow)    Internal Rotation Limitations towel between elbow      Shoulder Exercises: Pulleys   Flexion 3 minutes    Flexion Limitations to tolerance    Scaption 3 minutes    Scaption Limitations to tolerance      Shoulder Exercises: ROM/Strengthening   Wall Wash AAROM flexion, circles both ways and crossbody 10 reps;      Modalities   Modalities Vasopneumatic      Vasopneumatic   Number Minutes Vasopneumatic  10 minutes    Vasopnuematic  Location  Shoulder    Vasopneumatic Pressure Low    Vasopneumatic Temperature  coldest      Manual Therapy   Manual Therapy Passive ROM;Joint mobilization    Joint Mobilization L GH PA/inferior and distraction jt mobs grade III/IV    Passive ROM L shoulder PROM flexion, abduction, ER pain-free with prolonged holds at end range                      PT Short Term Goals - 12/14/20 1148       PT SHORT TERM GOAL #1   Title Patient to be independent with initial HEP.    Time 3    Period Weeks    Status Achieved    Target Date 12/25/20               PT Long Term Goals - 12/18/20 0955       PT LONG TERM GOAL #1   Title Patient to be independent with advanced HEP.    Time 8    Period Weeks    Status On-going      PT LONG TERM GOAL #2   Title Patient to demonstrate L UE strength >/=4+/5.    Time 8    Period Weeks    Status On-going      PT LONG TERM GOAL #3   Title Patient to demonstrate L shoulder AROM WFL and without pain limiting.    Time 8    Period Weeks    Status On-going   slight pain with end ranges     PT LONG TERM GOAL #4   Title Patient to lift 5lbs overhead with L UE with good scapular mechanics and no pain.    Time 8    Period Weeks    Status On-going      PT LONG TERM GOAL #5   Title Patient to return to gym with modifications as needed.    Time 8    Period Weeks    Status On-going                   Plan - 01/01/21 0846     Clinical Impression Statement Pt demonstrated a good performance once cued for technique. Cues needed during RTC strengthening to keep 90 deg at elbow to isolate RTC muscles. He still experiences pain with end range OH motions but feels like he is improving with his overall motion. Shoulder capsule feels less restricted, also demonstrated increases in shoulder motions post MT. Ended  session with vaso to control edema and soreness post session.    Personal Factors and Comorbidities Age;Comorbidity  3+;Past/Current Experience;Time since onset of injury/illness/exacerbation    Comorbidities osteoporosis, HTN, HLD, hyperglycemia, anxiety, anemia, L LE fx surgery 1989, L knee scope 2010, R knee scope 2012    PT Frequency 2x / week    PT Duration 8 weeks    PT Treatment/Interventions ADLs/Self Care Home Management;Cryotherapy;Electrical Stimulation;Iontophoresis 4mg /ml Dexamethasone;Moist Heat;Balance training;Therapeutic exercise;Therapeutic activities;Functional mobility training;Ultrasound;Neuromuscular re-education;Patient/family education;Manual techniques;Vasopneumatic Device;Taping;Energy conservation;Dry needling;Passive range of motion;Scar mobilization    PT Next Visit Plan progress PROM/AAROM/AROM, progress RTC and serratus strengthening    Consulted and Agree with Plan of Care Patient             Patient will benefit from skilled therapeutic intervention in order to improve the following deficits and impairments:  Impaired flexibility, Postural dysfunction, Improper body mechanics, Decreased range of motion, Increased muscle spasms, Pain, Impaired UE functional use, Increased fascial restricitons, Decreased strength, Decreased activity tolerance, Increased edema, Decreased scar mobility, Hypomobility  Visit Diagnosis: Acute pain of left shoulder  Stiffness of left shoulder, not elsewhere classified  Muscle weakness (generalized)  Other muscle spasm     Problem List 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   Skin tags, multiple acquired 07/07/2019   LUQ pain 07/07/2019   Primary osteoarthritis of right knee 03/16/2019   Ventral hernia s/p lap repair with mesh 06/03/2018 06/03/2018   Hemorrhoids 08/22/2016   Anemia 03/16/2016   Right shoulder pain 02/26/2016   Abnormal CT scan, gastrointestinal tract suspect angioedema    Abdominal pain    Hyperglycemia 08/21/2013   Low back pain  10/10/2012   Hyperlipidemia    Hypertension, essential     History of colon polyps    Obesity (BMI 30.0-34.9)     Artist Pais, PTA 01/01/2021, 9:14 AM  [facility] [address]  [place], [place], [address] Phone: [phone]   Fax:  [phone]  Name: [name] MRN: [id number] Date of Birth: July 03, 1945

## 2021-01-01 NOTE — Telephone Encounter (Signed)
Received fax from [facility]

## 2021-01-02 NOTE — Telephone Encounter (Signed)
Naproxen 500mg .  Sorry about that.

## 2021-01-02 NOTE — Telephone Encounter (Signed)
Left message on machine to call back  

## 2021-01-02 NOTE — Telephone Encounter (Signed)
Patient will get from ortho.  He does not want to get from [facility].

## 2021-01-04 ENCOUNTER — Other Ambulatory Visit: Payer: Self-pay

## 2021-01-04 ENCOUNTER — Ambulatory Visit: Payer: Medicare Other

## 2021-01-04 DIAGNOSIS — M62838 Other muscle spasm: Secondary | ICD-10-CM

## 2021-01-04 DIAGNOSIS — M6281 Muscle weakness (generalized): Secondary | ICD-10-CM | POA: Diagnosis not present

## 2021-01-04 DIAGNOSIS — M25612 Stiffness of left shoulder, not elsewhere classified: Secondary | ICD-10-CM | POA: Diagnosis not present

## 2021-01-04 DIAGNOSIS — M25512 Pain in left shoulder: Secondary | ICD-10-CM | POA: Diagnosis not present

## 2021-01-04 NOTE — Therapy (Addendum)
[facility] [address]  [place], [place], [address] Phone: [phone]   Fax:  [phone]  Physical Therapy Treatment / Progress Note  Patient Details  Name: [name] MRN: [id number] Date of Birth: [birth year] Referring Provider (PT): Marcene Duos, MD  Progress Note  Reporting Period 12/04/2020 to 01/04/2021  See note below for Objective Data and Assessment of Progress/Goals.     Encounter Date: 01/04/2021   PT End of Session - 01/04/21 1058     Visit Number 10    Number of Visits 17    Date for PT Re-Evaluation 01/29/21    Authorization Type Medicare & Tricare    PT Start Time 1018    PT Stop Time 1059    PT Time Calculation (min) 41 min    Activity Tolerance Patient tolerated treatment well    Behavior During Therapy [facility] for tasks assessed/performed             Past Medical History:  Diagnosis Date   Abnormal CT scan, gastrointestinal tract suspect angioedema    Anemia 03/16/2016   IRON   Anxiety    Arthritis    Back pain 10/10/2012   Fatty liver 2005   seen on 2005 ultrasound,05/2015 CT.    Hemorrhoids 08/22/2016   Hemorrhoids, internal, with bleeding 2005   internal and external. 2005 band ligation (dr Ferdinand Lango in [facility])   Hyperglycemia 08/21/2013   Hyperlipidemia    Hypertension    Nocturia 08/21/2013   Obesity (BMI 30.0-34.9)    Osteoporosis    Pneumonia    as a child   Rectal bleeding 07/30/2014   Renal cyst    SBO (small bowel obstruction) ([place])    Tubular adenoma of colon before 2005, 2012, 08/2014   Unspecified constipation 02/06/2014   Vitamin D deficiency     Past Surgical History:  Procedure Laterality Date   COLONOSCOPY  before 2005, 2012, 2013, 08/2014.    ENTEROSCOPY N/A 10/05/2017   Procedure: ENTEROSCOPY;  Surgeon: Gatha Mayer, MD;  [facility]: [facility];  Service: Endoscopy;  Laterality: N/A;   FRACTURE SURGERY Left 1989   leg   HEMORRHOID BANDING  2005   KNEE  SURGERY Left 2010   arthroscopy, torn carilage   KNEE SURGERY Right 2012   arthroscopy   TONSILLECTOMY  1957   TONSILLECTOMY     TOTAL SHOULDER ARTHROPLASTY Left 11/01/2020   Procedure: LEFT ANATOMIC SHOULDER REPLACEMENT;  Surgeon: Meredith Pel, MD;  Location: [facility];  Service: Orthopedics;  Laterality: Left;   VENTRAL HERNIA REPAIR N/A 06/03/2018   Procedure: LAPAROSCOPIC VENTRAL WALL  HERNIA  REPAIR WITH MESH  ERAS PATHWAY;  Surgeon: Michael Boston, MD;  Location: [facility];  Service: General;  Laterality: N/A;    There were no vitals filed for this visit.   Subjective Assessment - 01/04/21 1022     Subjective "Body has to get used to this metal". Still sore at the top of OH motions.    Pertinent History osteoporosis, HTN, HLD, hyperglycemia, anxiety, anemia, L LE fx surgery 1989, L knee scope 2010, R knee scope 2012    Diagnostic tests none recent    Patient Stated Goals decrease pain, improve motion    Currently in Pain? No/denies                [facility] PT Assessment - 01/04/21 0001       Assessment   Medical Diagnosis s/p L TSA  Referring Provider (PT) Marcene Duos, MD    Onset Date/Surgical Date 11/01/20      Observation/Other Assessments   Focus on Therapeutic Outcomes (FOTO)  Shoulder: 49 (has not attempted some activites)      AROM   Left Shoulder Flexion 132 Degrees    Left Shoulder ABduction 123 Degrees    Left Shoulder Internal Rotation 35 Degrees                           [facility] Adult PT Treatment/Exercise - 01/04/21 0001       Shoulder Exercises: Supine   Flexion AROM;Left;10 reps;Weights    Shoulder Flexion Weight (lbs) 2    Flexion Limitations thumb up; focusing on gentle stretch OH      Shoulder Exercises: Standing   External Rotation Strengthening;Left;Theraband;20 reps    Theraband Level (Shoulder External Rotation) Level 2 (Red)    Internal Rotation Strengthening;Left;20 reps;Theraband    Theraband Level (Shoulder Internal  Rotation) Level 2 (Red)    Other Standing Exercises B shoulder flexion and scaption with ball on wall; 10 reps each    Other Standing Exercises golf swing simulation 15 reps with cane      Shoulder Exercises: Pulleys   Flexion 3 minutes    Flexion Limitations to tolerance    Scaption 3 minutes    Scaption Limitations to tolerance      Shoulder Exercises: ROM/Strengthening   Wall Wash AAROM flexion and abduction 10 reps each                      PT Short Term Goals - 12/14/20 1148       PT SHORT TERM GOAL #1   Title Patient to be independent with initial HEP.    Time 3    Period Weeks    Status Achieved    Target Date 12/25/20               PT Long Term Goals - 01/04/21 1104       PT LONG TERM GOAL #1   Title Patient to be independent with advanced HEP.    Time 8    Period Weeks    Status On-going      PT LONG TERM GOAL #2   Title Patient to demonstrate L UE strength >/=4+/5.    Time 8    Period Weeks    Status On-going   not testing     PT LONG TERM GOAL #3   Title Patient to demonstrate L shoulder AROM WFL and without pain limiting.    Time 8    Period Weeks    Status On-going   AROM overall improving with slight pain with end ranges     PT LONG TERM GOAL #4   Title Patient to lift 5lbs overhead with L UE with good scapular mechanics and no pain.    Time 8    Period Weeks    Status On-going   not testing     PT LONG TERM GOAL #5   Title Patient to return to gym with modifications as needed.    Time 8    Period Weeks    Status On-going                   Plan - 01/04/21 1126     Clinical Impression Statement [name] demonstrates improvements with L shoulder AROM with flexion and ER. He continues to show  a good tolerance for exercises but experiences soreness when he reaches his limits with ROM. We have been practicing golf swings and he reports no pain with these. He does notes that after exercises his L shoulder decreases in  soreness. We have been strengthening the RTC and he requires cues to keep 90 deg at elbow to isolate these muscles during the resisted exercises. We did not test strength today as we are waiting for clearance from MD for [facility] strengthening exercises. He has been compliant with his HEP and reports no concerns today. Going foward he would continue to benefit from PT to address deficits in L shoulder AROM, strength, and OH lifting ability to restore his function.    Personal Factors and Comorbidities Age;Comorbidity 3+;Past/Current Experience;Time since onset of injury/illness/exacerbation    Comorbidities osteoporosis, HTN, HLD, hyperglycemia, anxiety, anemia, L LE fx surgery 1989, L knee scope 2010, R knee scope 2012    PT Frequency 2x / week    PT Duration 8 weeks    PT Treatment/Interventions ADLs/Self Care Home Management;Cryotherapy;Electrical Stimulation;Iontophoresis 72m/ml Dexamethasone;Moist Heat;Balance training;Therapeutic exercise;Therapeutic activities;Functional mobility training;Ultrasound;Neuromuscular re-education;Patient/family education;Manual techniques;Vasopneumatic Device;Taping;Energy conservation;Dry needling;Passive range of motion;Scar mobilization    PT Next Visit Plan progress PROM/AAROM/AROM, progress RTC and serratus strengthening    Consulted and Agree with Plan of Care Patient             Patient will benefit from skilled therapeutic intervention in order to improve the following deficits and impairments:  Impaired flexibility, Postural dysfunction, Improper body mechanics, Decreased range of motion, Increased muscle spasms, Pain, Impaired UE functional use, Increased fascial restricitons, Decreased strength, Decreased activity tolerance, Increased edema, Decreased scar mobility, Hypomobility  Visit Diagnosis: Acute pain of left shoulder  Stiffness of left shoulder, not elsewhere classified  Muscle weakness (generalized)  Other muscle spasm     Problem  List 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   Skin tags, multiple acquired 07/07/2019   LUQ pain 07/07/2019   Primary osteoarthritis of right knee 03/16/2019   Ventral hernia s/p lap repair with mesh 06/03/2018 06/03/2018   Hemorrhoids 08/22/2016   Anemia 03/16/2016   Right shoulder pain 02/26/2016   Abnormal CT scan, gastrointestinal tract suspect angioedema    Abdominal pain    Hyperglycemia 08/21/2013   Low back pain 10/10/2012   Hyperlipidemia    Hypertension, essential     History of colon polyps    Obesity (BMI 30.0-34.9)     BArtist Pais PTA 01/04/2021, 11:55 AM  [facility] [place] [address]Phone: [phone]  Fax:  [phone] Name: Charles NunziataMRN: [id number] of Birth: 806-16-1947  Mr. WCobleis progressing well s/p L TSA with improving ROM and function within limits of post-surgical precautions. He has met his STG and is progressing as expected per rehab protocol toward LTGs. He will continue to benefit from skilled PT to restore functional ROM and strength in his L shoulder to allow for resumption of normal use of L UE with ADLs and everyday tasks.  JPercival Spanish PT, MPT 01/04/21, 12:04 PM  [facility] [place] [address]Phone: [phone]  Fax:  [phone]

## 2021-01-08 ENCOUNTER — Ambulatory Visit: Payer: Medicare Other

## 2021-01-08 ENCOUNTER — Other Ambulatory Visit: Payer: Self-pay

## 2021-01-08 DIAGNOSIS — M6281 Muscle weakness (generalized): Secondary | ICD-10-CM

## 2021-01-08 DIAGNOSIS — M25612 Stiffness of left shoulder, not elsewhere classified: Secondary | ICD-10-CM

## 2021-01-08 DIAGNOSIS — M62838 Other muscle spasm: Secondary | ICD-10-CM | POA: Diagnosis not present

## 2021-01-08 DIAGNOSIS — M25512 Pain in left shoulder: Secondary | ICD-10-CM

## 2021-01-08 NOTE — Therapy (Signed)
[facility] [address]  [place], [place], [address] Phone: [phone]   Fax:  [phone]  Physical Therapy Treatment  Patient Details  Name: [name] MRN: [id number] Date of Birth: [birth year] Referring Provider (PT): Marcene Duos, MD   Encounter Date: 01/08/2021   PT End of Session - 01/08/21 0845     Visit Number 11    Number of Visits 17    Date for PT Re-Evaluation 01/29/21    Authorization Type Medicare & Tricare    PT Start Time 0803    PT Stop Time 5974    PT Time Calculation (min) 52 min    Activity Tolerance Patient tolerated treatment well    Behavior During Therapy [facility] for tasks assessed/performed             Past Medical History:  Diagnosis Date   Abnormal CT scan, gastrointestinal tract suspect angioedema    Anemia 03/16/2016   IRON   Anxiety    Arthritis    Back pain 10/10/2012   Fatty liver 2005   seen on 2005 ultrasound,05/2015 CT.    Hemorrhoids 08/22/2016   Hemorrhoids, internal, with bleeding 2005   internal and external. 2005 band ligation (dr Ferdinand Lango in [facility])   Hyperglycemia 08/21/2013   Hyperlipidemia    Hypertension    Nocturia 08/21/2013   Obesity (BMI 30.0-34.9)    Osteoporosis    Pneumonia    as a child   Rectal bleeding 07/30/2014   Renal cyst    SBO (small bowel obstruction) ([place])    Tubular adenoma of colon before 2005, 2012, 08/2014   Unspecified constipation 02/06/2014   Vitamin D deficiency     Past Surgical History:  Procedure Laterality Date   COLONOSCOPY  before 2005, 2012, 2013, 08/2014.    ENTEROSCOPY N/A 10/05/2017   Procedure: ENTEROSCOPY;  Surgeon: Gatha Mayer, MD;  [facility];  Service: Endoscopy;  Laterality: N/A;   FRACTURE SURGERY Left 1989   leg   HEMORRHOID BANDING  2005   KNEE SURGERY Left 2010   arthroscopy, torn carilage   KNEE SURGERY Right 2012   arthroscopy   TONSILLECTOMY  1957   TONSILLECTOMY     TOTAL  SHOULDER ARTHROPLASTY Left 11/01/2020   Procedure: LEFT ANATOMIC SHOULDER REPLACEMENT;  Surgeon: Meredith Pel, MD;  Location: [facility];  Service: Orthopedics;  Laterality: Left;   VENTRAL HERNIA REPAIR N/A 06/03/2018   Procedure: LAPAROSCOPIC VENTRAL WALL  HERNIA  REPAIR WITH MESH  ERAS PATHWAY;  Surgeon: Michael Boston, MD;  [facility];  Service: General;  Laterality: N/A;    There were no vitals filed for this visit.   Subjective Assessment - 01/08/21 0807     Subjective Soreness still in the L shoulder, exercises always help once we get moving.    Pertinent History osteoporosis, HTN, HLD, hyperglycemia, anxiety, anemia, L LE fx surgery 1989, L knee scope 2010, R knee scope 2012    Diagnostic tests none recent    Patient Stated Goals decrease pain, improve motion    Currently in Pain? No/denies                               [facility] Adult PT Treatment/Exercise - 01/08/21 0001       Exercises   Exercises Shoulder      Shoulder Exercises: Supine   Flexion AROM;Left;10 reps;Weights    Shoulder  Flexion Weight (lbs) 2    Other Supine Exercises CW/CCW circles with shoulder at 90 deg flex 10x each with 2# weight      Shoulder Exercises: Sidelying   External Rotation AROM;Left;10 reps;Weights    External Rotation Weight (lbs) 2    ABduction AROM;Left;10 reps;Weights    ABduction Weight (lbs) 1      Shoulder Exercises: Standing   External Rotation Strengthening;Left;10 reps;Theraband    Theraband Level (Shoulder External Rotation) Level 2 (Red)    External Rotation Limitations 3" hold    Internal Rotation Strengthening;Left;10 reps;Theraband    Theraband Level (Shoulder Internal Rotation) Level 2 (Red)    Internal Rotation Limitations 3" holds    Row Strengthening;Both;20 reps;Theraband    Theraband Level (Shoulder Row) Level 2 (Red)      Shoulder Exercises: Pulleys   Flexion 3 minutes    Flexion Limitations to tolerance    Scaption 3 minutes     Scaption Limitations to tolerance      Modalities   Modalities Vasopneumatic      Vasopneumatic   Number Minutes Vasopneumatic  10 minutes    Vasopnuematic Location  Shoulder    Vasopneumatic Pressure Low    Vasopneumatic Temperature  coldest      Manual Therapy   Manual Therapy Passive ROM;Soft tissue mobilization;Myofascial release    Soft tissue mobilization STM to L ant deltoid, proximal biceps    Myofascial Release L ant deltoid, manual TPR to proximal biceps    Passive ROM L shoulder PROM flexion, abduction, ER pain-free with prolonged holds at end range; contract relax into flexion                      PT Short Term Goals - 12/14/20 1148       PT SHORT TERM GOAL #1   Title Patient to be independent with initial HEP.    Time 3    Period Weeks    Status Achieved    Target Date 12/25/20               PT Long Term Goals - 01/04/21 1104       PT LONG TERM GOAL #1   Title Patient to be independent with advanced HEP.    Time 8    Period Weeks    Status On-going      PT LONG TERM GOAL #2   Title Patient to demonstrate L UE strength >/=4+/5.    Time 8    Period Weeks    Status On-going   not testing     PT LONG TERM GOAL #3   Title Patient to demonstrate L shoulder AROM WFL and without pain limiting.    Time 8    Period Weeks    Status On-going   AROM overall improving with slight pain with end ranges     PT LONG TERM GOAL #4   Title Patient to lift 5lbs overhead with L UE with good scapular mechanics and no pain.    Time 8    Period Weeks    Status On-going   not testing     PT LONG TERM GOAL #5   Title Patient to return to gym with modifications as needed.    Time 8    Period Weeks    Status On-going                   Plan - 01/08/21 0851     Clinical Impression  Statement Pt still experiences stiffness and limitations with L shoulder AROM pre sessions. He tolerates the exercises well but due to him still experiencing  stiffness he could benefit from more PROM and stretching to increase shoulder AROM. He has no complaints with the strengthening exercises. TTP found in the proximal biceps and ant deltoid today, after STM he notes improvement not only with soreness but L shoulder AROM. Ended session with GR to address any swelling or soreness post session.    Personal Factors and Comorbidities Age;Comorbidity 3+;Past/Current Experience;Time since onset of injury/illness/exacerbation    Comorbidities osteoporosis, HTN, HLD, hyperglycemia, anxiety, anemia, L LE fx surgery 1989, L knee scope 2010, R knee scope 2012    PT Frequency 2x / week    PT Duration 8 weeks    PT Treatment/Interventions ADLs/Self Care Home Management;Cryotherapy;Electrical Stimulation;Iontophoresis 4mg /ml Dexamethasone;Moist Heat;Balance training;Therapeutic exercise;Therapeutic activities;Functional mobility training;Ultrasound;Neuromuscular re-education;Patient/family education;Manual techniques;Vasopneumatic Device;Taping;Energy conservation;Dry needling;Passive range of motion;Scar mobilization    PT Next Visit Plan progress PROM/AAROM/AROM, progress RTC and serratus strengthening    Consulted and Agree with Plan of Care Patient             Patient will benefit from skilled therapeutic intervention in order to improve the following deficits and impairments:  Impaired flexibility, Postural dysfunction, Improper body mechanics, Decreased range of motion, Increased muscle spasms, Pain, Impaired UE functional use, Increased fascial restricitons, Decreased strength, Decreased activity tolerance, Increased edema, Decreased scar mobility, Hypomobility  Visit Diagnosis: Acute pain of left shoulder  Stiffness of left shoulder, not elsewhere classified  Muscle weakness (generalized)  Other muscle spasm     Problem List 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    Skin tags, multiple acquired 07/07/2019   LUQ pain 07/07/2019   Primary osteoarthritis of right knee 03/16/2019   Ventral hernia s/p lap repair with mesh 06/03/2018 06/03/2018   Hemorrhoids 08/22/2016   Anemia 03/16/2016   Right shoulder pain 02/26/2016   Abnormal CT scan, gastrointestinal tract suspect angioedema    Abdominal pain    Hyperglycemia 08/21/2013   Low back pain 10/10/2012   Hyperlipidemia    Hypertension, essential     History of colon polyps    Obesity (BMI 30.0-34.9)     Artist Pais, PTA 01/08/2021, 12:14 PM  [facility] [address]  [place], [place], [address] Phone: [phone]   Fax:  [phone]  Name: [name] MRN: [id number] Date of Birth: [birth year]

## 2021-01-11 ENCOUNTER — Other Ambulatory Visit: Payer: Self-pay

## 2021-01-11 ENCOUNTER — Ambulatory Visit: Payer: Medicare Other | Admitting: Rehabilitative and Restorative Service Providers"

## 2021-01-11 ENCOUNTER — Encounter: Payer: Self-pay | Admitting: Rehabilitative and Restorative Service Providers"

## 2021-01-11 DIAGNOSIS — M6281 Muscle weakness (generalized): Secondary | ICD-10-CM | POA: Diagnosis not present

## 2021-01-11 DIAGNOSIS — M25612 Stiffness of left shoulder, not elsewhere classified: Secondary | ICD-10-CM

## 2021-01-11 DIAGNOSIS — M25512 Pain in left shoulder: Secondary | ICD-10-CM | POA: Diagnosis not present

## 2021-01-11 DIAGNOSIS — M62838 Other muscle spasm: Secondary | ICD-10-CM

## 2021-01-11 NOTE — Therapy (Signed)
[facility] [address]  [place], [place], [address] Phone: [phone]   Fax:  [phone]  Physical Therapy Treatment  Patient Details  Name: [name] MRN: [id number] Date of Birth: [birth year] Referring Provider (PT): Marcene Duos, MD   Encounter Date: 01/11/2021   PT End of Session - 01/11/21 1153     Visit Number 12    Number of Visits 17    Date for PT Re-Evaluation 01/29/21    Authorization Type Medicare & Tricare    PT Start Time 1100    PT Stop Time 1200    PT Time Calculation (min) 60 min    Activity Tolerance Patient tolerated treatment well    Behavior During Therapy [facility] for tasks assessed/performed             Past Medical History:  Diagnosis Date   Abnormal CT scan, gastrointestinal tract suspect angioedema    Anemia 03/16/2016   IRON   Anxiety    Arthritis    Back pain 10/10/2012   Fatty liver 2005   seen on 2005 ultrasound,05/2015 CT.    Hemorrhoids 08/22/2016   Hemorrhoids, internal, with bleeding 2005   internal and external. 2005 band ligation (dr Ferdinand Lango in [facility])   Hyperglycemia 08/21/2013   Hyperlipidemia    Hypertension    Nocturia 08/21/2013   Obesity (BMI 30.0-34.9)    Osteoporosis    Pneumonia    as a child   Rectal bleeding 07/30/2014   Renal cyst    SBO (small bowel obstruction) ([place])    Tubular adenoma of colon before 2005, 2012, 08/2014   Unspecified constipation 02/06/2014   Vitamin D deficiency     Past Surgical History:  Procedure Laterality Date   COLONOSCOPY  before 2005, 2012, 2013, 08/2014.    ENTEROSCOPY N/A 10/05/2017   Procedure: ENTEROSCOPY;  Surgeon: Gatha Mayer, MD;  Location: [facility];  Service: Endoscopy;  Laterality: N/A;   FRACTURE SURGERY Left 1989   leg   HEMORRHOID BANDING  2005   KNEE SURGERY Left 2010   arthroscopy, torn carilage   KNEE SURGERY Right 2012   arthroscopy   TONSILLECTOMY  1957   TONSILLECTOMY     TOTAL  SHOULDER ARTHROPLASTY Left 11/01/2020   Procedure: LEFT ANATOMIC SHOULDER REPLACEMENT;  Surgeon: Meredith Pel, MD;  Location: [facility];  Service: Orthopedics;  Laterality: Left;   VENTRAL HERNIA REPAIR N/A 06/03/2018   Procedure: LAPAROSCOPIC VENTRAL WALL  HERNIA  REPAIR WITH MESH  ERAS PATHWAY;  Surgeon: Michael Boston, MD;  Location: [facility];  Service: General;  Laterality: N/A;    There were no vitals filed for this visit.   Subjective Assessment - 01/11/21 1151     Subjective I am stiff, the exercises help.    Patient Stated Goals decrease pain, improve motion    Currently in Pain? Yes    Pain Score 3     Pain Location Shoulder    Pain Orientation Left;Anterior    Pain Descriptors / Indicators Sore    Pain Type Acute pain                [facility] PT Assessment - 01/11/21 0001       PROM   Left Shoulder Flexion 140 Degrees    Left Shoulder ABduction 120 Degrees  [facility] Adult PT Treatment/Exercise - 01/11/21 0001       Shoulder Exercises: Supine   Flexion AROM;Left;10 reps;Weights    Shoulder Flexion Weight (lbs) 2    Flexion Limitations thumb up; focusing on gentle stretch OH    Other Supine Exercises CW/CCW circles with shoulder at 90 deg flex 10x each with 2# weight      Shoulder Exercises: Sidelying   External Rotation AROM;Left;10 reps;Weights   2x10   External Rotation Weight (lbs) 2    ABduction AROM;Left;10 reps;Weights   2x10   ABduction Weight (lbs) 2      Shoulder Exercises: Standing   External Rotation Strengthening;Left;10 reps;Theraband   2x10   Theraband Level (Shoulder External Rotation) Level 2 (Red)    External Rotation Limitations 3" hold    Internal Rotation Strengthening;Left;10 reps;Theraband   2x10   Theraband Level (Shoulder Internal Rotation) Level 2 (Red)    Internal Rotation Limitations 3" holds    Row Strengthening;Both;20 reps;Theraband    Theraband Level (Shoulder Row) Level 2 (Red)    Other  Standing Exercises B shoulder flexion and scaption with ball on wall; 10 reps each      Shoulder Exercises: Pulleys   Flexion 3 minutes    Flexion Limitations to tolerance    Scaption 3 minutes    Scaption Limitations to tolerance      Modalities   Modalities Vasopneumatic      Vasopneumatic   Number Minutes Vasopneumatic  10 minutes    Vasopnuematic Location  Shoulder    Vasopneumatic Pressure Low    Vasopneumatic Temperature  34      Manual Therapy   Manual Therapy Passive ROM;Soft tissue mobilization;Myofascial release    Soft tissue mobilization STM to L ant deltoid, proximal biceps    Myofascial Release L ant deltoid, manual TPR to proximal biceps    Passive ROM L shoulder PROM flexion, abduction, ER pain-free with prolonged holds at end range; contract relax into flexion                      PT Short Term Goals - 12/14/20 1148       PT SHORT TERM GOAL #1   Title Patient to be independent with initial HEP.    Time 3    Period Weeks    Status Achieved    Target Date 12/25/20               PT Long Term Goals - 01/11/21 1201       PT LONG TERM GOAL #1   Title Patient to be independent with advanced HEP.    Status On-going      PT LONG TERM GOAL #2   Title Patient to demonstrate L UE strength >/=4+/5.    Status On-going      PT LONG TERM GOAL #3   Title Patient to demonstrate L shoulder AROM WFL and without pain limiting.    Status On-going      PT LONG TERM GOAL #4   Title Patient to lift 5lbs overhead with L UE with good scapular mechanics and no pain.    Status On-going      PT LONG TERM GOAL #5   Title Patient to return to gym with modifications as needed.    Status On-going                   Plan - 01/11/21 1155     Clinical Impression Statement Pt continues  to progress towards goal related activities and progressing with increased ROM.  He continues to have trigger points noted and improves with TTP and STM.  He  continues to require cuing to keep his shoulders down during standing exercises.  He continues to require skilled PT to progress towards goal related activities.    PT Treatment/Interventions ADLs/Self Care Home Management;Cryotherapy;Electrical Stimulation;Iontophoresis 4mg /ml Dexamethasone;Moist Heat;Balance training;Therapeutic exercise;Therapeutic activities;Functional mobility training;Ultrasound;Neuromuscular re-education;Patient/family education;Manual techniques;Vasopneumatic Device;Taping;Energy conservation;Dry needling;Passive range of motion;Scar mobilization    PT Next Visit Plan progress PROM/AAROM/AROM, progress RTC and serratus strengthening    Consulted and Agree with Plan of Care Patient             Patient will benefit from skilled therapeutic intervention in order to improve the following deficits and impairments:  Impaired flexibility, Postural dysfunction, Improper body mechanics, Decreased range of motion, Increased muscle spasms, Pain, Impaired UE functional use, Increased fascial restricitons, Decreased strength, Decreased activity tolerance, Increased edema, Decreased scar mobility, Hypomobility  Visit Diagnosis: Acute pain of left shoulder  Stiffness of left shoulder, not elsewhere classified  Muscle weakness (generalized)  Other muscle spasm     Problem List 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   Skin tags, multiple acquired 07/07/2019   LUQ pain 07/07/2019   Primary osteoarthritis of right knee 03/16/2019   Ventral hernia s/p lap repair with mesh 06/03/2018 06/03/2018   Hemorrhoids 08/22/2016   Anemia 03/16/2016   Right shoulder pain 02/26/2016   Abnormal CT scan, gastrointestinal tract suspect angioedema    Abdominal pain    Hyperglycemia 08/21/2013   Low back pain 10/10/2012   Hyperlipidemia    Hypertension, essential     History of colon polyps    Obesity (BMI  30.0-34.9)     Juel Burrow, PT, DPT 01/11/2021, 12:04 PM  [facility] [address]  [place], [place], [address] Phone: [phone]   Fax:  [phone]  Name: [name] MRN: [id number] Date of Birth: 10-15-1945

## 2021-01-15 ENCOUNTER — Ambulatory Visit: Payer: Medicare Other

## 2021-01-15 ENCOUNTER — Other Ambulatory Visit: Payer: Self-pay

## 2021-01-15 DIAGNOSIS — M6281 Muscle weakness (generalized): Secondary | ICD-10-CM

## 2021-01-15 DIAGNOSIS — M25612 Stiffness of left shoulder, not elsewhere classified: Secondary | ICD-10-CM | POA: Diagnosis not present

## 2021-01-15 DIAGNOSIS — M62838 Other muscle spasm: Secondary | ICD-10-CM | POA: Diagnosis not present

## 2021-01-15 DIAGNOSIS — M25512 Pain in left shoulder: Secondary | ICD-10-CM

## 2021-01-15 NOTE — Therapy (Signed)
[facility] [address]  [place], [place], [address] Phone: [phone]   Fax:  [phone]  Physical Therapy Treatment  Patient Details  Name: [name] MRN: [id number] Date of Birth: May 02, 1946 Referring Provider (PT): Marcene Duos, MD   Encounter Date: 01/15/2021   PT End of Session - 01/15/21 0847     Visit Number 13    Number of Visits 17    Date for PT Re-Evaluation 01/29/21    Authorization Type Medicare & Tricare    PT Start Time 0800    PT Stop Time 3546    PT Time Calculation (min) 55 min    Activity Tolerance Patient tolerated treatment well    Behavior During Therapy [facility] for tasks assessed/performed             Past Medical History:  Diagnosis Date   Abnormal CT scan, gastrointestinal tract suspect angioedema    Anemia 03/16/2016   IRON   Anxiety    Arthritis    Back pain 10/10/2012   Fatty liver 2005   seen on 2005 ultrasound,05/2015 CT.    Hemorrhoids 08/22/2016   Hemorrhoids, internal, with bleeding 2005   internal and external. 2005 band ligation (dr Ferdinand Lango in [facility])   Hyperglycemia 08/21/2013   Hyperlipidemia    Hypertension    Nocturia 08/21/2013   Obesity (BMI 30.0-34.9)    Osteoporosis    Pneumonia    as a child   Rectal bleeding 07/30/2014   Renal cyst    SBO (small bowel obstruction) ([place])    Tubular adenoma of colon before 2005, 2012, 08/2014   Unspecified constipation 02/06/2014   Vitamin D deficiency     Past Surgical History:  Procedure Laterality Date   COLONOSCOPY  before 2005, 2012, 2013, 08/2014.    ENTEROSCOPY N/A 10/05/2017   Procedure: ENTEROSCOPY;  Surgeon: Gatha Mayer, MD;  [facility]: [facility];  Service: Endoscopy;  Laterality: N/A;   FRACTURE SURGERY Left 1989   leg   HEMORRHOID BANDING  2005   KNEE SURGERY Left 2010   arthroscopy, torn carilage   KNEE SURGERY Right 2012   arthroscopy   TONSILLECTOMY  1957   TONSILLECTOMY     TOTAL  SHOULDER ARTHROPLASTY Left 11/01/2020   Procedure: LEFT ANATOMIC SHOULDER REPLACEMENT;  Surgeon: Meredith Pel, MD;  Location: [facility];  Service: Orthopedics;  Laterality: Left;   VENTRAL HERNIA REPAIR N/A 06/03/2018   Procedure: LAPAROSCOPIC VENTRAL WALL  HERNIA  REPAIR WITH MESH  ERAS PATHWAY;  Surgeon: Michael Boston, MD;  [facility];  Service: General;  Laterality: N/A;    There were no vitals filed for this visit.   Subjective Assessment - 01/15/21 0802     Subjective Notices less shooting pains than before.    Pertinent History osteoporosis, HTN, HLD, hyperglycemia, anxiety, anemia, L LE fx surgery 1989, L knee scope 2010, R knee scope 2012    Diagnostic tests none recent    Patient Stated Goals decrease pain, improve motion    Currently in Pain? No/denies                               [facility] Adult PT Treatment/Exercise - 01/15/21 0001       Shoulder Exercises: Supine   Flexion AROM;Left;Weights;5 reps    Shoulder Flexion Weight (lbs) 2    Flexion Limitations 5" holds      Shoulder  Exercises: Sidelying   External Rotation Strengthening;Left;20 reps;Weights    External Rotation Weight (lbs) 2    ABduction Strengthening;Left;10 reps;Weights    ABduction Weight (lbs) 2; 3" holds      Shoulder Exercises: Standing   External Rotation Strengthening;Left;10 reps;Theraband    Theraband Level (Shoulder External Rotation) Level 3 (Green)    Internal Rotation Strengthening;Left;10 reps;Theraband    Theraband Level (Shoulder Internal Rotation) Level 3 (Green)    Extension Strengthening;Both;10 reps;Theraband    Theraband Level (Shoulder Extension) Level 3 (Green)    Row Strengthening;Both;10 reps;Theraband    Theraband Level (Shoulder Row) Level 3 (Green)      Shoulder Exercises: Pulleys   Flexion 3 minutes    Flexion Limitations to tolerance    Scaption 3 minutes    Scaption Limitations to tolerance      Shoulder Exercises: ROM/Strengthening   Wall  Wash 3 way; R shoulder flexion, scaption, and crossbody 10x each      Modalities   Modalities Vasopneumatic      Vasopneumatic   Number Minutes Vasopneumatic  10 minutes    Vasopnuematic Location  Shoulder    Vasopneumatic Pressure Low    Vasopneumatic Temperature  34      Manual Therapy   Manual Therapy Passive ROM;Soft tissue mobilization;Myofascial release    Soft tissue mobilization STM to L anterolateral deltoid, proximal biceps    Myofascial Release manual TPR to L proximal biceps, anterolateral deltoid    Passive ROM L shoulder PROM flexion, abduction, ER pain-free with prolonged holds at end range; contract relax into flexion                      PT Short Term Goals - 12/14/20 1148       PT SHORT TERM GOAL #1   Title Patient to be independent with initial HEP.    Time 3    Period Weeks    Status Achieved    Target Date 12/25/20               PT Long Term Goals - 01/11/21 1201       PT LONG TERM GOAL #1   Title Patient to be independent with advanced HEP.    Status On-going      PT LONG TERM GOAL #2   Title Patient to demonstrate L UE strength >/=4+/5.    Status On-going      PT LONG TERM GOAL #3   Title Patient to demonstrate L shoulder AROM WFL and without pain limiting.    Status On-going      PT LONG TERM GOAL #4   Title Patient to lift 5lbs overhead with L UE with good scapular mechanics and no pain.    Status On-going      PT LONG TERM GOAL #5   Title Patient to return to gym with modifications as needed.    Status On-going                   Plan - 01/15/21 0849     Clinical Impression Statement Pt has reportedly been increasing his resistance with his supine exercises and has been doing AROM in standing positions at home. Pt still demonstrating tender points and a taut band along the anterolateral deltoid. He was able to increase resistance with the scap stab and RTC strengthening exercises with minimal pain. Post MT he  shows slightly increased L shoulder AROM, even though formal measurements were not taken. He responded  well to session and will continue to benefit from R shoulder stretching into end range motions and RTC strengthening to maximize function.    Personal Factors and Comorbidities Age;Comorbidity 3+;Past/Current Experience;Time since onset of injury/illness/exacerbation    Comorbidities osteoporosis, HTN, HLD, hyperglycemia, anxiety, anemia, L LE fx surgery 1989, L knee scope 2010, R knee scope 2012    PT Frequency 2x / week    PT Duration 8 weeks    PT Treatment/Interventions ADLs/Self Care Home Management;Cryotherapy;Electrical Stimulation;Iontophoresis 4mg /ml Dexamethasone;Moist Heat;Balance training;Therapeutic exercise;Therapeutic activities;Functional mobility training;Ultrasound;Neuromuscular re-education;Patient/family education;Manual techniques;Vasopneumatic Device;Taping;Energy conservation;Dry needling;Passive range of motion;Scar mobilization    PT Next Visit Plan progress PROM/AAROM/AROM, progress RTC and serratus strengthening    Consulted and Agree with Plan of Care Patient             Patient will benefit from skilled therapeutic intervention in order to improve the following deficits and impairments:  Impaired flexibility, Postural dysfunction, Improper body mechanics, Decreased range of motion, Increased muscle spasms, Pain, Impaired UE functional use, Increased fascial restricitons, Decreased strength, Decreased activity tolerance, Increased edema, Decreased scar mobility, Hypomobility  Visit Diagnosis: Acute pain of left shoulder  Stiffness of left shoulder, not elsewhere classified  Muscle weakness (generalized)  Other muscle spasm     Problem List 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  [place], [place], [address] Phone: [phone]   Fax:  [phone]  Name: [name] MRN: [id number] Date of Birth: [birth year]

## 2021-01-17 ENCOUNTER — Other Ambulatory Visit: Payer: Self-pay | Admitting: Family Medicine

## 2021-01-18 ENCOUNTER — Other Ambulatory Visit: Payer: Self-pay

## 2021-01-18 ENCOUNTER — Ambulatory Visit: Payer: Medicare Other | Admitting: Physical Therapy

## 2021-01-18 ENCOUNTER — Encounter: Payer: Self-pay | Admitting: Physical Therapy

## 2021-01-18 ENCOUNTER — Telehealth: Payer: Self-pay | Admitting: *Deleted

## 2021-01-18 DIAGNOSIS — M6281 Muscle weakness (generalized): Secondary | ICD-10-CM

## 2021-01-18 DIAGNOSIS — M25612 Stiffness of left shoulder, not elsewhere classified: Secondary | ICD-10-CM

## 2021-01-18 DIAGNOSIS — M25512 Pain in left shoulder: Secondary | ICD-10-CM

## 2021-01-18 DIAGNOSIS — M62838 Other muscle spasm: Secondary | ICD-10-CM | POA: Diagnosis not present

## 2021-01-18 MED ORDER — AMLODIPINE BESYLATE 5 MG PO TABS: 5.0000 mg | ORAL_TABLET | Freq: Every day | ORAL | 1 refills | Status: DC

## 2021-01-18 NOTE — Telephone Encounter (Signed)
Spoke with patient and he only needed [facility]ipine [facility]

## 2021-01-18 NOTE — Therapy (Signed)
[facility] [address]  [place], [place], [address] Phone: [phone]   Fax:  [phone]  Physical Therapy Treatment  Patient Details  Name: [name] MRN: [id number] Date of Birth: 03-29-1946 Referring Provider (PT): Marcene Duos, MD   Encounter Date: 01/18/2021   PT End of Session - 01/18/21 1151     Visit Number 14    Number of Visits 17    Date for PT Re-Evaluation 01/29/21    Authorization Type Medicare & Tricare    PT Start Time 1056    PT Stop Time 1155   moist heat   PT Time Calculation (min) 59 min    Activity Tolerance Patient tolerated treatment well    Behavior During Therapy [facility] for tasks assessed/performed             Past Medical History:  Diagnosis Date   Abnormal CT scan, gastrointestinal tract suspect angioedema    Anemia 03/16/2016   IRON   Anxiety    Arthritis    Back pain 10/10/2012   Fatty liver 2005   seen on 2005 ultrasound,05/2015 CT.    Hemorrhoids 08/22/2016   Hemorrhoids, internal, with bleeding 2005   internal and external. 2005 band ligation (dr Ferdinand Lango in [facility])   Hyperglycemia 08/21/2013   Hyperlipidemia    Hypertension    Nocturia 08/21/2013   Obesity (BMI 30.0-34.9)    Osteoporosis    Pneumonia    as a child   Rectal bleeding 07/30/2014   Renal cyst    SBO (small bowel obstruction) ([place])    Tubular adenoma of colon before 2005, 2012, 08/2014   Unspecified constipation 02/06/2014   Vitamin D deficiency     Past Surgical History:  Procedure Laterality Date   COLONOSCOPY  before 2005, 2012, 2013, 08/2014.    ENTEROSCOPY N/A 10/05/2017   Procedure: ENTEROSCOPY;  Surgeon: Gatha Mayer, MD;  [facility];  Service: Endoscopy;  Laterality: N/A;   FRACTURE SURGERY Left 1989   leg   HEMORRHOID BANDING  2005   KNEE SURGERY Left 2010   arthroscopy, torn carilage   KNEE SURGERY Right 2012   arthroscopy   TONSILLECTOMY  1957   TONSILLECTOMY      TOTAL SHOULDER ARTHROPLASTY Left 11/01/2020   Procedure: LEFT ANATOMIC SHOULDER REPLACEMENT;  Surgeon: Meredith Pel, MD;  Location: [place];  Service: Orthopedics;  Laterality: Left;   VENTRAL HERNIA REPAIR N/A 06/03/2018   Procedure: LAPAROSCOPIC VENTRAL WALL  HERNIA  REPAIR WITH MESH  ERAS PATHWAY;  Surgeon: Michael Boston, MD;  [facility];  Service: General;  Laterality: N/A;    There were no vitals filed for this visit.   Subjective Assessment - 01/18/21 1058     Subjective The shoulder is better but stays sore.    Pertinent History osteoporosis, HTN, HLD, hyperglycemia, anxiety, anemia, L LE fx surgery 1989, L knee scope 2010, R knee scope 2012    Diagnostic tests none recent    Patient Stated Goals decrease pain, improve motion    Currently in Pain? Yes    Pain Score 3     Pain Location Shoulder    Pain Orientation Left;Anterior    Pain Descriptors / Indicators Sore    Pain Type Acute pain                               [facility] Adult PT Treatment/Exercise -  01/18/21 0001       Shoulder Exercises: Supine   Protraction Strengthening;Both;10 reps    Protraction Weight (lbs) 3, 5    Protraction Limitations cues to maintain elbow straight      Shoulder Exercises: Standing   Flexion Strengthening;Left;10 reps;Theraband    Theraband Level (Shoulder Flexion) Level 1 (Yellow)    Flexion Limitations thumb up   manual assistance to encourage elbow extended and upward scapular rotation   ABduction Strengthening;Right;Left;10 reps;Theraband    Theraband Level (Shoulder ABduction) Level 1 (Yellow)    ABduction Limitations thumb up   manual cues for scapular upward rotation   Other Standing Exercises L shoulder IR AAROM with wand 10x3", + crossing midline with wand 10x3" to tolerance   cues to avoid jerking     Shoulder Exercises: Pulleys   Flexion 3 minutes    Flexion Limitations to tolerance    Scaption 3 minutes    Scaption Limitations to tolerance       Moist Heat Therapy   Number Minutes Moist Heat 7 Minutes    Moist Heat Location Shoulder   L     Manual Therapy   Manual Therapy Passive ROM;Soft tissue mobilization;Myofascial release    Manual therapy comments supine    Joint Mobilization L GH PA/AP/inferiorjt mobs grade IV    Soft tissue mobilization STM to L UT, LS    Myofascial Release manual TPR to L UT, LS   manual TPR to tender trigger points   Passive ROM L shoulder PROM flexion, abduction, ER, IR pain-free with prolonged holds at end range                    PT Education - 01/18/21 1151     Education Details update/consolidation of HEP    Person(s) Educated Patient    Methods Explanation;Demonstration;Tactile cues;Verbal cues;Handout    Comprehension Verbalized understanding;Returned demonstration              PT Short Term Goals - 12/14/20 1148       PT SHORT TERM GOAL #1   Title Patient to be independent with initial HEP.    Time 3    Period Weeks    Status Achieved    Target Date 12/25/20               PT Long Term Goals - 01/11/21 1201       PT LONG TERM GOAL #1   Title Patient to be independent with advanced HEP.    Status On-going      PT LONG TERM GOAL #2   Title Patient to demonstrate L UE strength >/=4+/5.    Status On-going      PT LONG TERM GOAL #3   Title Patient to demonstrate L shoulder AROM WFL and without pain limiting.    Status On-going      PT LONG TERM GOAL #4   Title Patient to lift 5lbs overhead with L UE with good scapular mechanics and no pain.    Status On-going      PT LONG TERM GOAL #5   Title Patient to return to gym with modifications as needed.    Status On-going                   Plan - 01/18/21 1152     Clinical Impression Statement Patient without new complaints at start of session. Patient tolerated MT to address joint mobility and PROM. Initiated IR PROM and AAROM within  tolerable ROM and while maintaining slow controlled motion.  Also initiated shoulder AROM with light resistance. Manual assistance to encourage scapular upward rotation and straight elbow was required. Patient reported some UT tightness at end of session, which was addressed with STM and moist heat. Patient reported no complaints at end of session.    Personal Factors and Comorbidities Age;Comorbidity 3+;Past/Current Experience;Time since onset of injury/illness/exacerbation    Comorbidities osteoporosis, HTN, HLD, hyperglycemia, anxiety, anemia, L LE fx surgery 1989, L knee scope 2010, R knee scope 2012    PT Frequency 2x / week    PT Duration 8 weeks    PT Treatment/Interventions ADLs/Self Care Home Management;Cryotherapy;Electrical Stimulation;Iontophoresis '4mg'$ /ml Dexamethasone;Moist Heat;Balance training;Therapeutic exercise;Therapeutic activities;Functional mobility training;Ultrasound;Neuromuscular re-education;Patient/family education;Manual techniques;Vasopneumatic Device;Taping;Energy conservation;Dry needling;Passive range of motion;Scar mobilization    PT Next Visit Plan progress AAROM/AROM, progress RTC and serratus strengthening    Consulted and Agree with Plan of Care Patient             Patient will benefit from skilled therapeutic intervention in order to improve the following deficits and impairments:  Impaired flexibility, Postural dysfunction, Improper body mechanics, Decreased range of motion, Increased muscle spasms, Pain, Impaired UE functional use, Increased fascial restricitons, Decreased strength, Decreased activity tolerance, Increased edema, Decreased scar mobility, Hypomobility  Visit Diagnosis: Acute pain of left shoulder  Stiffness of left shoulder, not elsewhere classified  Muscle weakness (generalized)  Other muscle spasm     Problem List 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   Skin tags, multiple acquired 07/07/2019   LUQ pain  07/07/2019   Primary osteoarthritis of right knee 03/16/2019   Ventral hernia s/p lap repair with mesh 06/03/2018 06/03/2018   Hemorrhoids 08/22/2016   Anemia 03/16/2016   Right shoulder pain 02/26/2016   Abnormal CT scan, gastrointestinal tract suspect angioedema    Abdominal pain    Hyperglycemia 08/21/2013   Low back pain 10/10/2012   Hyperlipidemia    Hypertension, essential     History of colon polyps    Obesity (BMI 30.0-34.9)      Janene Harvey, PT, DPT 01/18/21 12:00 PM    [facility] [address]  [place], [place], [address] Phone: [phone]   Fax:  [phone]  Name: [name] MRN: [id number] Date of Birth: 09-Jun-1946

## 2021-01-18 NOTE — Patient Instructions (Signed)
Access Code: [email or web address] URL: [email or web address] Date: 01/18/2021 Prepared by: Grayling Congress  Exercises Seated Shoulder Flexion AAROM with Pulley Behind - 2 x daily - 7 x weekly - 3 min hold Seated Shoulder Abduction AAROM with Pulley Behind - 2 x daily - 7 x weekly - 3 min hold Standing Shoulder Internal Rotation AAROM with Dowel - 2 x daily - 7 x weekly - 2 sets - 10 reps - 3 sec hold Supine Scapular Protraction in Flexion with Dumbbells - 2 x daily - 7 x weekly - 2 sets - 10 reps Shoulder External Rotation with Anchored Resistance - 1 x daily - 7 x weekly - 2 sets - 10 reps Shoulder Internal Rotation with Resistance - 1 x daily - 7 x weekly - 2 sets - 10 reps Standing Shoulder Flexion with Resistance - 2 x daily - 7 x weekly - 2 sets - 10 reps Standing Single Arm Shoulder Abduction with Resistance - 2 x daily - 7 x weekly - 2 sets - 10 reps

## 2021-01-21 ENCOUNTER — Other Ambulatory Visit (HOSPITAL_BASED_OUTPATIENT_CLINIC_OR_DEPARTMENT_OTHER): Payer: Self-pay

## 2021-01-21 ENCOUNTER — Other Ambulatory Visit: Payer: Self-pay | Admitting: Family Medicine

## 2021-01-21 MED ORDER — SILDENAFIL CITRATE 20 MG PO TABS
ORAL_TABLET | ORAL | 3 refills | Status: AC
  Filled 2021-01-21: qty 20, 7d supply, fill #0

## 2021-01-21 MED ORDER — HYOSCYAMINE SULFATE 0.125 MG PO TBDP
0.1250 mg | ORAL_TABLET | ORAL | 2 refills | Status: DC | PRN
  Filled 2021-01-21: qty 30, 5d supply, fill #0

## 2021-01-22 ENCOUNTER — Ambulatory Visit: Payer: Medicare Other

## 2021-01-22 ENCOUNTER — Other Ambulatory Visit: Payer: Self-pay

## 2021-01-22 DIAGNOSIS — M62838 Other muscle spasm: Secondary | ICD-10-CM

## 2021-01-22 DIAGNOSIS — M6281 Muscle weakness (generalized): Secondary | ICD-10-CM

## 2021-01-22 DIAGNOSIS — M25612 Stiffness of left shoulder, not elsewhere classified: Secondary | ICD-10-CM | POA: Diagnosis not present

## 2021-01-22 DIAGNOSIS — M25512 Pain in left shoulder: Secondary | ICD-10-CM

## 2021-01-22 NOTE — Therapy (Signed)
[facility] [address]  [place], [place], [address] Phone: [phone]   Fax:  [phone]  Physical Therapy Treatment  Patient Details  Name: [name] MRN: [id number] Date of Birth: [birth year] Referring Provider (PT): Marcene Duos, MD   Encounter Date: 01/22/2021   PT End of Session - 01/22/21 0900     Visit Number 15    Number of Visits 17    Date for PT Re-Evaluation 01/29/21    Authorization Type Medicare & Tricare    PT Start Time 0800    PT Stop Time 0850    PT Time Calculation (min) 50 min    Activity Tolerance Patient tolerated treatment well    Behavior During Therapy [facility] for tasks assessed/performed             Past Medical History:  Diagnosis Date   Abnormal CT scan, gastrointestinal tract suspect angioedema    Anemia 03/16/2016   IRON   Anxiety    Arthritis    Back pain 10/10/2012   Fatty liver 2005   seen on 2005 ultrasound,05/2015 CT.    Hemorrhoids 08/22/2016   Hemorrhoids, internal, with bleeding 2005   internal and external. 2005 band ligation (dr Ferdinand Lango in [facility])   Hyperglycemia 08/21/2013   Hyperlipidemia    Hypertension    Nocturia 08/21/2013   Obesity (BMI 30.0-34.9)    Osteoporosis    Pneumonia    as a child   Rectal bleeding 07/30/2014   Renal cyst    SBO (small bowel obstruction) ([place])    Tubular adenoma of colon before 2005, 2012, 08/2014   Unspecified constipation 02/06/2014   Vitamin D deficiency     Past Surgical History:  Procedure Laterality Date   COLONOSCOPY  before 2005, 2012, 2013, 08/2014.    ENTEROSCOPY N/A 10/05/2017   Procedure: ENTEROSCOPY;  Surgeon: Gatha Mayer, MD;  [facility];  Service: Endoscopy;  Laterality: N/A;   FRACTURE SURGERY Left 1989   leg   HEMORRHOID BANDING  2005   KNEE SURGERY Left 2010   arthroscopy, torn carilage   KNEE SURGERY Right 2012   arthroscopy   TONSILLECTOMY  1957   TONSILLECTOMY     TOTAL  SHOULDER ARTHROPLASTY Left 11/01/2020   Procedure: LEFT ANATOMIC SHOULDER REPLACEMENT;  Surgeon: Meredith Pel, MD;  Location: [place];  Service: Orthopedics;  Laterality: Left;   VENTRAL HERNIA REPAIR N/A 06/03/2018   Procedure: LAPAROSCOPIC VENTRAL WALL  HERNIA  REPAIR WITH MESH  ERAS PATHWAY;  Surgeon: Michael Boston, MD;  [facility];  Service: General;  Laterality: N/A;    There were no vitals filed for this visit.   Subjective Assessment - 01/22/21 0802     Subjective Pt notes that he still has soreness in his shoulder.    Pertinent History osteoporosis, HTN, HLD, hyperglycemia, anxiety, anemia, L LE fx surgery 1989, L knee scope 2010, R knee scope 2012    Diagnostic tests none recent    Patient Stated Goals decrease pain, improve motion    Currently in Pain? Yes    Pain Score 2     Pain Location Shoulder    Pain Orientation Left    Pain Descriptors / Indicators Sore    Pain Type Acute pain;Surgical pain                [facility] PT Assessment - 01/22/21 0001       AROM   Left Shoulder Flexion  136 Degrees    Left Shoulder ABduction 125 Degrees      Strength   Right/Left Shoulder Left    Left Shoulder Flexion 3+/5   mild pain   Left Shoulder ABduction 4-/5    Left Shoulder Internal Rotation 4-/5    Left Shoulder External Rotation 4-/5   mild pain                          [facility] Adult PT Treatment/Exercise - 01/22/21 0001       Shoulder Exercises: Seated   Horizontal ABduction Strengthening;Both;10 reps;Theraband    Theraband Level (Shoulder Horizontal ABduction) Level 1 (Yellow)    Flexion Strengthening;Left;10 reps;Theraband    Theraband Level (Shoulder Flexion) Level 1 (Yellow)    Abduction Strengthening;Left;10 reps;Theraband    Theraband Level (Shoulder ABduction) Level 1 (Yellow)      Shoulder Exercises: Standing   External Rotation Strengthening;Left;20 reps;Theraband    Theraband Level (Shoulder External Rotation) Level 3 (Green)     External Rotation Limitations 2x10; towel to keep elbow in    Internal Rotation Strengthening;Left;20 reps;Theraband    Theraband Level (Shoulder Internal Rotation) Level 3 (Green)    Flexion Strengthening;Left;10 reps;Theraband    Theraband Level (Shoulder Flexion) Level 1 (Yellow)    Flexion Limitations thumb up    ABduction Strengthening;Left;10 reps;Theraband    Theraband Level (Shoulder ABduction) Level 1 (Yellow)    ABduction Limitations thumb up      Shoulder Exercises: Pulleys   Flexion 3 minutes    Flexion Limitations to tolerance    Scaption 3 minutes    Scaption Limitations to tolerance      Shoulder Exercises: ROM/Strengthening   Other ROM/Strengthening Exercises finger ladder into flexion 15x each      Manual Therapy   Manual Therapy Passive ROM;Soft tissue mobilization;Myofascial release;Joint mobilization    Manual therapy comments supine    Joint Mobilization L GH PA/inferiorjt mobs grade IV    Soft tissue mobilization STM to L UT, LS    Myofascial Release manual TPR to L UT, LS    Passive ROM L shoulder PROM flexion, abduction with prolonged holds at end range; contract and relax                      PT Short Term Goals - 12/14/20 1148       PT SHORT TERM GOAL #1   Title Patient to be independent with initial HEP.    Time 3    Period Weeks    Status Achieved    Target Date 12/25/20               PT Long Term Goals - 01/22/21 0910       PT LONG TERM GOAL #1   Title Patient to be independent with advanced HEP.    Status Partially Met      PT LONG TERM GOAL #2   Title Patient to demonstrate L UE strength >/=4+/5.    Status On-going   all muscles below 4+/5     PT LONG TERM GOAL #3   Title Patient to demonstrate L shoulder AROM WFL and without pain limiting.    Status On-going   flexion and abduction improving     PT LONG TERM GOAL #4   Title Patient to lift 5lbs overhead with L UE with good scapular mechanics and no pain.     Status On-going      PT  LONG TERM GOAL #5   Title Patient to return to gym with modifications as needed.    Status On-going                   Plan - 01/22/21 0902     Clinical Impression Statement Pt demonstrates improvement with overhead AROM. He still experiences constant soreness near the incision. We have initiated light resisted shoulder flex and abd. Pt demonstrates global weakness in his L UE from strength testing results today, with pain in flexion and ER. Addressed soft tissue restriction in upper shoulder area with STM and did joint mobs to increase shoulder AROM. He demonstrated an increase in AROM post MT and stated that he felt much "looser" after. He is making progress towards strength and AROM. Pt declined any modalities post session.    Personal Factors and Comorbidities Age;Comorbidity 3+;Past/Current Experience;Time since onset of injury/illness/exacerbation    Comorbidities osteoporosis, HTN, HLD, hyperglycemia, anxiety, anemia, L LE fx surgery 1989, L knee scope 2010, R knee scope 2012    PT Frequency 2x / week    PT Duration 8 weeks    PT Treatment/Interventions ADLs/Self Care Home Management;Cryotherapy;Electrical Stimulation;Iontophoresis 14m/ml Dexamethasone;Moist Heat;Balance training;Therapeutic exercise;Therapeutic activities;Functional mobility training;Ultrasound;Neuromuscular re-education;Patient/family education;Manual techniques;Vasopneumatic Device;Taping;Energy conservation;Dry needling;Passive range of motion;Scar mobilization    PT Next Visit Plan progress AAROM/AROM, progress RTC and serratus strengthening    Consulted and Agree with Plan of Care Patient             Patient will benefit from skilled therapeutic intervention in order to improve the following deficits and impairments:  Impaired flexibility, Postural dysfunction, Improper body mechanics, Decreased range of motion, Increased muscle spasms, Pain, Impaired UE functional use, Increased  fascial restricitons, Decreased strength, Decreased activity tolerance, Increased edema, Decreased scar mobility, Hypomobility  Visit Diagnosis: Acute pain of left shoulder  Stiffness of left shoulder, not elsewhere classified  Muscle weakness (generalized)  Other muscle spasm     Problem List 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   Skin tags, multiple acquired 07/07/2019   LUQ pain 07/07/2019   Primary osteoarthritis of right knee 03/16/2019   Ventral hernia s/p lap repair with mesh 06/03/2018 06/03/2018   Hemorrhoids 08/22/2016   Anemia 03/16/2016   Right shoulder pain 02/26/2016   Abnormal CT scan, gastrointestinal tract suspect angioedema    Abdominal pain    Hyperglycemia 08/21/2013   Low back pain 10/10/2012   Hyperlipidemia    Hypertension, essential     History of colon polyps    Obesity (BMI 30.0-34.9)     BArtist Pais PTA 01/22/2021, 9:12 AM  [facility] [place] [address]Phone: [phone]  Fax:  [phone] Name: HCornelious BartolucciMRN: [id number] of Birth: 8Apr 22, 1947

## 2021-01-24 ENCOUNTER — Other Ambulatory Visit: Payer: Self-pay

## 2021-01-24 ENCOUNTER — Encounter: Payer: Self-pay | Admitting: Orthopedic Surgery

## 2021-01-24 ENCOUNTER — Ambulatory Visit (INDEPENDENT_AMBULATORY_CARE_PROVIDER_SITE_OTHER): Payer: Medicare Other | Admitting: Orthopedic Surgery

## 2021-01-24 DIAGNOSIS — Z96612 Presence of left artificial shoulder joint: Secondary | ICD-10-CM

## 2021-01-24 NOTE — Addendum Note (Signed)
Addended byLaurann Montana on: 01/24/2021 09:44 AM   Modules accepted: Orders

## 2021-01-24 NOTE — Progress Notes (Signed)
Post-Op Visit Note   Patient: [name]           Date of Birth: 01/03/1946           MRN: [id number] Visit Date: 01/24/2021 PCP: Mosie Lukes, MD   Assessment & Plan:  Chief Complaint:  Chief Complaint  Patient presents with   Left Shoulder - Routine Post Op   Visit Diagnoses:  1. S/P shoulder replacement, left     Plan: [place] is now about 2-1/2 months out from left shoulder replacement.  He is doing okay.  Says that he is still "working progress".  Doing therapy 2 days a week.  Working on flexibility and less then range of motion which would be our focus.  8-week return for final check.  Okay to start doing some golfing 3 months postop which would be August 5..  Has some anterior pain.  The deltoid and biceps pain has improved.  He is sleeping in his own bed.  On examination he has external rotation passive range of motion on the left to 30/80/110.  He is able to get his arm up overhead.  Subscap strength is 4 out of 5 with 5 out of 5 infraspinatus and supraspinatus strength.  Plan at this time is to continue therapy 1 time a week for 8 weeks to continue to work on range of motion more than strengthening.  8-week return for final recheck.  Okay to play golf on August 5.  Follow-Up Instructions: Return in about 8 weeks (around 03/21/2021).   Orders:  No orders of the defined types were placed in this encounter.  No orders of the defined types were placed in this encounter.   Imaging: No results found.  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   Skin tags, multiple acquired 07/07/2019   LUQ pain 07/07/2019   Primary osteoarthritis of right knee 03/16/2019   Ventral hernia s/p lap repair with mesh 06/03/2018 06/03/2018   Hemorrhoids 08/22/2016   Anemia 03/16/2016   Right shoulder pain 02/26/2016   Abnormal CT scan, gastrointestinal tract suspect angioedema    Abdominal pain     Hyperglycemia 08/21/2013   Low back pain 10/10/2012   Hyperlipidemia    Hypertension, essential     History of colon polyps    Obesity (BMI 30.0-34.9)    Past Medical History:  Diagnosis Date   Abnormal CT scan, gastrointestinal tract suspect angioedema    Anemia 03/16/2016   IRON   Anxiety    Arthritis    Back pain 10/10/2012   Fatty liver 2005   seen on 2005 ultrasound,05/2015 CT.    Hemorrhoids 08/22/2016   Hemorrhoids, internal, with bleeding 2005   internal and external. 2005 band ligation (dr Ferdinand Lango in [facility])   Hyperglycemia 08/21/2013   Hyperlipidemia    Hypertension    Nocturia 08/21/2013   Obesity (BMI 30.0-34.9)    Osteoporosis    Pneumonia    as a child   Rectal bleeding 07/30/2014   Renal cyst    SBO (small bowel obstruction) ([facility])    Tubular adenoma of colon before 2005, 2012, 08/2014   Unspecified constipation 02/06/2014   Vitamin D deficiency     Family History  Problem Relation Age of Onset   Stroke Mother    Aneurysm Mother        brain   Arthritis Father    Liver cancer  Father        liver, atomic testing in [place]   Gallbladder disease Father    Hyperlipidemia Sister    Hypertension Sister    Obesity Sister    Diabetes Sister    Diabetes Brother    Hyperlipidemia Sister    Hypertension Sister    Obesity Sister    Diabetes Sister    Aneurysm Brother        brain   COPD Brother    Gallbladder disease Sister        x4   Colon cancer Neg Hx    Colon polyps Neg Hx    Esophageal cancer Neg Hx    Rectal cancer Neg Hx    Stomach cancer Neg Hx    Prostate cancer Neg Hx    CAD Neg Hx     Past Surgical History:  Procedure Laterality Date   COLONOSCOPY  before 2005, 2012, 2013, 08/2014.    ENTEROSCOPY N/A 10/05/2017   Procedure: ENTEROSCOPY;  Surgeon: Gatha Mayer, MD;  [facility];  Service: Endoscopy;  Laterality: N/A;   FRACTURE SURGERY Left 1989   leg   HEMORRHOID BANDING  2005   KNEE SURGERY Left 2010   arthroscopy,  torn carilage   KNEE SURGERY Right 2012   arthroscopy   TONSILLECTOMY  1957   TONSILLECTOMY     TOTAL SHOULDER ARTHROPLASTY Left 11/01/2020   Procedure: LEFT ANATOMIC SHOULDER REPLACEMENT;  Surgeon: Meredith Pel, MD;  Location: [place];  Service: Orthopedics;  Laterality: Left;   VENTRAL HERNIA REPAIR N/A 06/03/2018   Procedure: LAPAROSCOPIC VENTRAL WALL  HERNIA  REPAIR WITH MESH  ERAS PATHWAY;  Surgeon: Michael Boston, MD;  [facility];  Service: General;  Laterality: N/A;   Social History   Occupational History   Occupation: Retired    Fish farm manager: [employer]  Tobacco Use   Smoking status: Never   Smokeless tobacco: Never  Vaping Use   Vaping Use: Never used  Substance and Sexual Activity   Alcohol use: Yes    Alcohol/week: 2.0 standard drinks    Types: 2 Standard drinks or equivalent per week    Comment: Occassionally   Drug use: No   Sexual activity: Yes

## 2021-01-25 ENCOUNTER — Ambulatory Visit: Payer: Medicare Other | Admitting: Physical Therapy

## 2021-01-25 ENCOUNTER — Encounter: Payer: Self-pay | Admitting: Physical Therapy

## 2021-01-25 DIAGNOSIS — M25512 Pain in left shoulder: Secondary | ICD-10-CM | POA: Diagnosis not present

## 2021-01-25 DIAGNOSIS — M62838 Other muscle spasm: Secondary | ICD-10-CM | POA: Diagnosis not present

## 2021-01-25 DIAGNOSIS — M6281 Muscle weakness (generalized): Secondary | ICD-10-CM

## 2021-01-25 DIAGNOSIS — M25612 Stiffness of left shoulder, not elsewhere classified: Secondary | ICD-10-CM

## 2021-01-25 NOTE — Therapy (Signed)
[facility] [address]  [place], [place], [address] Phone: [phone]   Fax:  [phone]  Physical Therapy Treatment  Patient Details  Name: [name] MRN: [id number] Date of Birth: 03-16-1946 Referring Provider (PT): Marcene Duos, MD   Encounter Date: 01/25/2021   PT End of Session - 01/25/21 1147     Visit Number 16    Number of Visits 24    Date for PT Re-Evaluation 03/22/21    Authorization Type Medicare & Tricare    PT Start Time 1100    PT Stop Time 1153    PT Time Calculation (min) 53 min    Activity Tolerance Patient tolerated treatment well    Behavior During Therapy [facility] for tasks assessed/performed             Past Medical History:  Diagnosis Date   Abnormal CT scan, gastrointestinal tract suspect angioedema    Anemia 03/16/2016   IRON   Anxiety    Arthritis    Back pain 10/10/2012   Fatty liver 2005   seen on 2005 ultrasound,05/2015 CT.    Hemorrhoids 08/22/2016   Hemorrhoids, internal, with bleeding 2005   internal and external. 2005 band ligation (dr Ferdinand Lango in [facility])   Hyperglycemia 08/21/2013   Hyperlipidemia    Hypertension    Nocturia 08/21/2013   Obesity (BMI 30.0-34.9)    Osteoporosis    Pneumonia    as a child   Rectal bleeding 07/30/2014   Renal cyst    SBO (small bowel obstruction) ([place])    Tubular adenoma of colon before 2005, 2012, 08/2014   Unspecified constipation 02/06/2014   Vitamin D deficiency     Past Surgical History:  Procedure Laterality Date   COLONOSCOPY  before 2005, 2012, 2013, 08/2014.    ENTEROSCOPY N/A 10/05/2017   Procedure: ENTEROSCOPY;  Surgeon: Gatha Mayer, MD;  Location: [facility];  Service: Endoscopy;  Laterality: N/A;   FRACTURE SURGERY Left 1989   leg   HEMORRHOID BANDING  2005   KNEE SURGERY Left 2010   arthroscopy, torn carilage   KNEE SURGERY Right 2012   arthroscopy   TONSILLECTOMY  1957   TONSILLECTOMY     TOTAL  SHOULDER ARTHROPLASTY Left 11/01/2020   Procedure: LEFT ANATOMIC SHOULDER REPLACEMENT;  Surgeon: Meredith Pel, MD;  Location: [facility];  Service: Orthopedics;  Laterality: Left;   VENTRAL HERNIA REPAIR N/A 06/03/2018   Procedure: LAPAROSCOPIC VENTRAL WALL  HERNIA  REPAIR WITH MESH  ERAS PATHWAY;  Surgeon: Michael Boston, MD;  Location: [facility];  Service: General;  Laterality: N/A;    There were no vitals filed for this visit.   Subjective Assessment - 01/25/21 1101     Subjective Saw his MD recently who was pleased with his progress and would like him to continue with PT for strengthening. Has increased his reps and weight with his HEP.    Pertinent History osteoporosis, HTN, HLD, hyperglycemia, anxiety, anemia, L LE fx surgery 1989, L knee scope 2010, R knee scope 2012    Diagnostic tests none recent    Patient Stated Goals decrease pain, improve motion    Currently in Pain? Yes    Pain Score 2     Pain Location Shoulder    Pain Orientation Left    Pain Descriptors / Indicators Sore    Pain Type Acute pain;Surgical pain  [facility] PT Assessment - 01/25/21 0001       Assessment   Medical Diagnosis s/p L TSA    Referring Provider (PT) Marcene Duos, MD    Onset Date/Surgical Date 11/01/20      AROM   Left Shoulder Flexion 131 Degrees    Left Shoulder ABduction 123 Degrees    Left Shoulder Internal Rotation --   FIR L sacrum   Left Shoulder External Rotation --   FER T1     Strength   Left Shoulder Flexion 3+/5    Left Shoulder ABduction 4-/5    Left Shoulder Internal Rotation 4-/5    Left Shoulder External Rotation 4-/5                           [facility] Adult PT Treatment/Exercise - 01/25/21 0001       Shoulder Exercises: Supine   Horizontal ABduction Strengthening;Both;10 reps;Theraband    Theraband Level (Shoulder Horizontal ABduction) Level 2 (Red)    Horizontal ABduction Limitations cueing for improved control    External Rotation  Strengthening;Both;10 reps;Theraband    Theraband Level (Shoulder External Rotation) Level 2 (Red)    External Rotation Limitations limited ROM on L      Shoulder Exercises: Prone   Other Prone Exercises L shoulder prone over counter I, T, Y's   manual assistance for scapular upward rotation     Shoulder Exercises: Standing   External Rotation Strengthening;Left;15 reps;Theraband    Theraband Level (Shoulder External Rotation) Level 3 (Green)    Internal Rotation Strengthening;Left;Theraband;15 reps    Theraband Level (Shoulder Internal Rotation) Level 3 (Green)    Internal Rotation Limitations shoulder in neutral    Other Standing Exercises L shoulder IR stretch with strap 10x3" to tolerance   cues for slow controlled motion     Shoulder Exercises: Pulleys   Flexion 3 minutes    Flexion Limitations to tolerance    Scaption 3 minutes    Scaption Limitations to tolerance      Vasopneumatic   Number Minutes Vasopneumatic  10 minutes    Vasopnuematic Location  Shoulder   L   Vasopneumatic Pressure Low    Vasopneumatic Temperature  34      Manual Therapy   Manual Therapy Passive ROM;Soft tissue mobilization;Myofascial release;Joint mobilization    Manual therapy comments supine    Joint Mobilization L shoulder distraction to tolerance    Passive ROM L shoulder PROM flexion, abduction, IR, ER to tolerance pain-free                    PT Education - 01/25/21 1144     Education Details update to HEP; edu on avoiding excessive repetitive stress on shoulder and to increase reps OR weights with HEP rather than both; edu on safe upper body machine strengthening    Person(s) Educated Patient    Methods Explanation;Demonstration;Tactile cues;Verbal cues;Handout    Comprehension Verbalized understanding;Returned demonstration              PT Short Term Goals - 01/25/21 1106       PT SHORT TERM GOAL #1   Title Patient to be independent with initial HEP.    Time 3     Period Weeks    Status Achieved    Target Date 12/25/20               PT Long Term Goals - 01/25/21 1106  PT LONG TERM GOAL #1   Title Patient to be independent with advanced HEP.    Time 8    Status Partially Met    Target Date 03/22/21      PT LONG TERM GOAL #2   Title Patient to demonstrate L UE strength >/=4+/5.    Time 8    Status On-going   all muscles below 4+/5   Target Date 03/22/21      PT LONG TERM GOAL #3   Title Patient to demonstrate L shoulder AROM WFL and without pain limiting.    Time 8    Status On-going   improving, still limited   Target Date 03/22/21      PT LONG TERM GOAL #4   Title Patient to lift 5lbs overhead with L UE with good scapular mechanics and no pain.    Time 8    Status On-going   not yet able   Target Date 03/22/21      PT LONG TERM GOAL #5   Title Patient to return to gym with modifications as needed.    Time 8    Status On-going   has not yet returned   Target Date 03/22/21                   Plan - 01/25/21 1147     Clinical Impression Statement Patient reporting that he recently followed up with his surgeon who advised him to continue with PT for strengthening. Patient is making progress in L shoulder mobility and strength, however still has room for improvement, especially as he would like to return to gym workouts and golf. Educated patient on safe and reasonable progression of upper body workout and advised to avoid excessive reps/weight. Patient reported understanding. Patient performed progressive periscapular and RTC strengthening ther-ex with intermittent cueing for proper form and manual assistance to encourage upward scapular rotation. Tolerated progression of gentle IR stretching well. Updated HEP with this exercise- patient reported understanding and without complaints at end of session. Patient is demonstrating good progress towards goals. Would benefit from additional skilled PT services 1x/week for 8  weeks to address remaining goals and return to PLOF.    Personal Factors and Comorbidities Age;Comorbidity 3+;Past/Current Experience;Time since onset of injury/illness/exacerbation    Comorbidities osteoporosis, HTN, HLD, hyperglycemia, anxiety, anemia, L LE fx surgery 1989, L knee scope 2010, R knee scope 2012    PT Frequency 1x / week    PT Duration 8 weeks    PT Treatment/Interventions ADLs/Self Care Home Management;Cryotherapy;Electrical Stimulation;Iontophoresis 30m/ml Dexamethasone;Moist Heat;Balance training;Therapeutic exercise;Therapeutic activities;Functional mobility training;Ultrasound;Neuromuscular re-education;Patient/family education;Manual techniques;Vasopneumatic Device;Taping;Energy conservation;Dry needling;Passive range of motion;Scar mobilization    PT Next Visit Plan progress RTC and serratus strengthening    Consulted and Agree with Plan of Care Patient             Patient will benefit from skilled therapeutic intervention in order to improve the following deficits and impairments:  Impaired flexibility, Postural dysfunction, Improper body mechanics, Decreased range of motion, Increased muscle spasms, Pain, Impaired UE functional use, Increased fascial restricitons, Decreased strength, Decreased activity tolerance, Increased edema, Decreased scar mobility, Hypomobility  Visit Diagnosis: Acute pain of left shoulder  Stiffness of left shoulder, not elsewhere classified  Muscle weakness (generalized)  Other muscle spasm     Problem List 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  [place], [place], [address] Phone: [phone]   Fax:  [phone]  Name: Montavis Schubring MRN: [id number] Date of Birth: 1946/06/15

## 2021-02-06 ENCOUNTER — Other Ambulatory Visit: Payer: Self-pay

## 2021-02-06 ENCOUNTER — Other Ambulatory Visit (HOSPITAL_BASED_OUTPATIENT_CLINIC_OR_DEPARTMENT_OTHER): Payer: Self-pay

## 2021-02-06 ENCOUNTER — Encounter: Payer: Self-pay | Admitting: Medical

## 2021-02-06 ENCOUNTER — Ambulatory Visit (INDEPENDENT_AMBULATORY_CARE_PROVIDER_SITE_OTHER): Payer: Medicare Other | Admitting: Medical

## 2021-02-06 VITALS — BP 120/78 | HR 76 | Resp 18 | Ht 70.0 in | Wt 216.0 lb

## 2021-02-06 DIAGNOSIS — L989 Disorder of the skin and subcutaneous tissue, unspecified: Secondary | ICD-10-CM | POA: Diagnosis not present

## 2021-02-06 DIAGNOSIS — I10 Essential (primary) hypertension: Secondary | ICD-10-CM | POA: Diagnosis not present

## 2021-02-06 DIAGNOSIS — L821 Other seborrheic keratosis: Secondary | ICD-10-CM

## 2021-02-06 DIAGNOSIS — K649 Unspecified hemorrhoids: Secondary | ICD-10-CM

## 2021-02-06 DIAGNOSIS — E785 Hyperlipidemia, unspecified: Secondary | ICD-10-CM

## 2021-02-06 DIAGNOSIS — D649 Anemia, unspecified: Secondary | ICD-10-CM | POA: Diagnosis not present

## 2021-02-06 DIAGNOSIS — R739 Hyperglycemia, unspecified: Secondary | ICD-10-CM

## 2021-02-06 LAB — CBC WITH DIFFERENTIAL/PLATELET
Basophils Absolute: 0 10*3/uL (ref 0.0–0.1)
Basophils Relative: 0.7 % (ref 0.0–3.0)
Eosinophils Absolute: 0.3 10*3/uL (ref 0.0–0.7)
Eosinophils Relative: 6.1 % — ABNORMAL HIGH (ref 0.0–5.0)
HCT: 39.8 % (ref 39.0–52.0)
Hemoglobin: 13.1 g/dL (ref 13.0–17.0)
Lymphocytes Relative: 36.6 % (ref 12.0–46.0)
Lymphs Abs: 1.6 10*3/uL (ref 0.7–4.0)
MCHC: 32.8 g/dL (ref 30.0–36.0)
MCV: 85 fl (ref 78.0–100.0)
Monocytes Absolute: 0.5 10*3/uL (ref 0.1–1.0)
Monocytes Relative: 11.2 % (ref 3.0–12.0)
Neutro Abs: 2 10*3/uL (ref 1.4–7.7)
Neutrophils Relative %: 45.4 % (ref 43.0–77.0)
Platelets: 199 10*3/uL (ref 150.0–400.0)
RBC: 4.68 Mil/uL (ref 4.22–5.81)
RDW: 14.5 % (ref 11.5–15.5)
WBC: 4.3 10*3/uL (ref 4.0–10.5)

## 2021-02-06 LAB — IRON: Iron: 103 ug/dL (ref 42–165)

## 2021-02-06 MED ORDER — HYDROCORTISONE ACETATE 25 MG RE SUPP
25.0000 mg | Freq: Two times a day (BID) | RECTAL | 1 refills | Status: DC
  Filled 2021-02-06: qty 12, 6d supply, fill #0

## 2021-02-06 NOTE — Progress Notes (Signed)
Subjective:    Patient ID: [name], male    DOB: 1946-03-02, 75 y.o.   MRN: [id number]  HPI  Pt in for follow up.  Update me left shoulder surgery. Had replacement. Pt is still doing PT. Now down to once a week.  Pt last visit had low normal level iron. Pt had referral to GI back in early May. Pt was called by GI MD but missed call as was busy with above surgery. Also mild anemia.    Pt does have hx of hemorhoids. When constipated will see bright red blood on paper when he strains.  Pt mentions some itching to upper chest and back for years. Pt has skin lesions. He thinks seborrheic keratosis. He indicated wants to see dermatologist.   Hx of htn. On losartan, toprol-xl and amlodipine.   High cholesterol and on livalo.  Mild elevated sugar but a1c not in diabetic range.  Pt declined a1 and lipid panel today.  Review of Systems  Constitutional:  Negative for chills, fatigue and fever.  HENT:  Negative for congestion, drooling, ear discharge and ear pain.   Respiratory:  Negative for cough, chest tightness, shortness of breath and wheezing.   Cardiovascular:  Negative for chest pain and palpitations.  Gastrointestinal:  Negative for abdominal pain, blood in stool, constipation and diarrhea.  Genitourinary:  Negative for difficulty urinating, enuresis, flank pain, frequency, hematuria, penile pain and testicular pain.  Musculoskeletal:  Negative for back pain, gait problem, joint swelling and myalgias.    Past Medical History:  Diagnosis Date   Abnormal CT scan, gastrointestinal tract suspect angioedema    Anemia 03/16/2016   IRON   Anxiety    Arthritis    Back pain 10/10/2012   Fatty liver 2005   seen on 2005 ultrasound,05/2015 CT.    Hemorrhoids 08/22/2016   Hemorrhoids, internal, with bleeding 2005   internal and external. 2005 band ligation ([facility] Ferdinand L[facility])   Hyperglycemia 08/21/2013   Hyperlipidemia    Hypertension    Nocturia 08/21/2013   Obesity (BMI  30.0-34.9)    Osteoporosis    Pneumonia    as a child   Rectal bleeding 07/30/2014   Renal cyst    SBO (small bowel obstruction) ([facility])    Tubular adenoma of colon before 2005, 2012, 08/2014   Unspecified constipation 02/06/2014   Vitamin D deficiency      Social History   Socioeconomic History   Marital status: Married    Spouse name: Not on file   Number of children: 1   Years of education: Not on file   Highest education level: Not on file  Occupational History   Occupation: Retired    Fish farm manager: [employer]  Tobacco Use   Smoking status: Never   Smokeless tobacco: Never  Vaping Use   Vaping Use: Never used  Substance and Sexual Activity   Alcohol use: Yes    Alcohol/week: 2.0 standard drinks    Types: 2 Standard drinks or equivalent per week    Comment: Occassionally   Drug use: No   Sexual activity: Yes  Other Topics Concern   Not on file  Social History Narrative   Married - 1 child   Retired from [employer], no tbacco/drug use   Social Determinants of Health   Financial Resource Strain: Not on file  Food Insecurity: Not on file  Transportation Needs: Not on file  Physical Activity: Not on file  Stress: Not on file  Social Connections: Not on file  Intimate Partner Violence: Not on file    Past Surgical History:  Procedure Laterality Date   COLONOSCOPY  before 2005, 2012, 2013, 08/2014.    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   Procedure: LEFT ANATOMIC SHOULDER REPLACEMENT;  Surgeon: Meredith Pel, MD;  Location: [facility];  Service: Orthopedics;  Laterality: Left;   VENTRAL HERNIA  REPAIR N/A 06/03/2018   Procedure: LAPAROSCOPIC VENTRAL WALL  HERNIA  REPAIR WITH MESH  ERAS PATHWAY;  Surgeon: Michael Boston, MD;  [facility]: [facility];  Service: General;  Laterality: N/A;    Family History  Problem Relation Age of Onset   Stroke Mother    Aneurysm Mother        brain   Arthritis Father    Liver cancer Father        liver, atomic testing in [place]   Gallbladder disease Father    Hyperlipidemia Sister    Hypertension Sister    Obesity Sister    Diabetes Sister    Diabetes Brother    Hyperlipidemia Sister    Hypertension Sister    Obesity Sister    Diabetes Sister    Aneurysm Brother        brain   COPD Brother    Gallbladder disease Sister        x4   Colon cancer Neg Hx    Colon polyps Neg Hx    Esophageal cancer Neg Hx    Rectal cancer Neg Hx    Stomach cancer Neg Hx    Prostate cancer Neg Hx    CAD Neg Hx     No Known Allergies  Current Outpatient Medications on File Prior to Visit  Medication Sig Dispense Refill   acetaminophen (TYLENOL) 325 MG tablet Take 1-2 tablets (325-650 mg total) by mouth every 6 (six) hours as needed for mild pain (pain score 1-3 or temp > 100.5). 100 tablet 0   ALPRAZolam (XANAX) 1 MG tablet TAKE 1 TABLET (1 MG TOTAL) BY MOUTH AT BEDTIME AS NEEDED FOR ANXIETY. 30 tablet 0   amLODipine (NORVASC) 5 MG tablet Take 1 tablet (5 mg total) by mouth daily. 90 tablet 1   Camphor-Menthol-Methyl Sal (SALONPAS) 3.07-05-08 % PTCH Place 1 patch onto the skin daily as needed (pain).     COVID-19 mRNA Vac-TriS, Pfizer, SUSP injection Inject into the muscle. 0.3 mL 0   docusate sodium (COLACE) 100 MG capsule Take 1 capsule (100 mg total) by mouth 2 (two) times daily. 100 capsule 0   ferrous fumarate (HEMOCYTE - 106 MG FE) 325 (106 Fe) MG TABS tablet Take 1 tablet (106 mg of iron total) by mouth 2 (two) times daily. 60 tablet 3   fluticasone (FLONASE) 50 MCG/ACT nasal spray Place 2 sprays into both nostrils daily. (Patient taking differently:  Place 2 sprays into both nostrils daily as needed for allergies.) 16 g 3   hyoscyamine (ANASPAZ) 0.125 MG TBDP disintergrating tablet Place 1 tablet (0.125 mg total) under the tongue every 4 (four) hours as needed for cramping. 30 tablet 2  losartan (COZAAR) 50 MG tablet TAKE 1 TABLET DAILY 90 tablet 3   methocarbamol (ROBAXIN) 500 MG tablet Take 1 tablet (500 mg total) by mouth every 6 (six) hours as needed for muscle spasms. 30 tablet 0   metoprolol succinate (TOPROL-XL) 50 MG 24 hr tablet TAKE 1 TABLET DAILY 90 tablet 3   Multiple Vitamins-Minerals (MULTIVITAMIN ADULT PO) Take 2 tablets by mouth daily.     naproxen (NAPROSYN) 500 MG tablet Take one-half tablet (250 mg total) by mouth 2 (two) times daily with a meal. 30 tablet 0   oxyCODONE (OXY IR/ROXICODONE) 5 MG immediate release tablet Take 1-2 tablets (5-10 mg total) by mouth every 4 (four) hours as needed for moderate pain (pain score 4-6). 35 tablet 0   Pitavastatin Calcium (LIVALO) 2 MG TABS Take 1 tablet (2 mg total) by mouth daily. 90 tablet 0   polyethylene glycol (MIRALAX / GLYCOLAX) packet Take 17 g by mouth daily as needed for moderate constipation.     sildenafil (REVATIO) 20 MG tablet TAKE 1 TO 5 TABLETS BY MOUTH AS NEEDED 1 HOUR PRIOR TO INTERCOURSE 20 tablet 3   Tdap (BOOSTRIX) 5-2.5-18.5 LF-MCG/0.5 injection Inject into the muscle. 0.5 mL 0   trolamine salicylate (ASPERCREME) 10 % cream Apply 1 application topically as needed for muscle pain.     No current facility-administered medications on file prior to visit.    BP 131/88   Pulse 76   Resp 18   Ht '5\' 10"'$  (1.778 m)   Wt 216 lb (98 kg)   SpO2 99%   BMI 30.99 kg/m       Objective:   Physical Exam  General Mental Status- Alert. General Appearance- Not in acute distress.   Skin General: Color- Normal Color. Moisture- Normal Moisture.  Neck Carotid Arteries- Normal color. Moisture- Normal Moisture. No carotid bruits. No JVD.  Chest and Lung  Exam Auscultation: Breath Sounds:-Normal.  Cardiovascular Auscultation:Rythm- Regular. Murmurs & Other Heart Sounds:Auscultation of the heart reveals- No Murmurs.  Abdomen Inspection:-Inspeection Normal. Palpation/Percussion:Note:No mass. Palpation and Percussion of the abdomen reveal- Non Tender, Non Distended + BS, no rebound or guarding.   Neurologic Cranial Nerve exam:- CN III-XII intact(No nystagmus), symmetric smile. Strength:- 5/5 equal and symmetric strength both upper and lower extremities.       Assessment & Plan:   Htn well controlled today. Continue current bp med regimen.  High cholesterol and well controlled 3 months ago. Continue livalo.  For elevated sugar continue low sugar diet.  For hemorrhoids rx annusol hc supp. Do recommend follwing up with gi.  Will recheck your cbc and iron level today. Follow thru/contact gi MD as Dr. Larose Kells placed that referral.  For skin lesions/sk referred to dermatologsit.  Follow up date to be determined after lab review.  Mackie Pai, PA-C

## 2021-02-06 NOTE — Patient Instructions (Addendum)
Htn well  controlled today. Continue current bp med regimen.  High cholesterol and well controlled 3 months ago. Continue livalo.  For elevated sugar continue low sugar diet.  For hemorrhoids rx annusol hc supp. Do recommend follwing up with gi.  Will recheck your cbc and iron level today. Follow thru/contact gi MD as Dr. Larose Kells placed that referral.  For skin lesions/sk referred to dermatologsit.  Follow up date to be determined after lab review.

## 2021-02-13 ENCOUNTER — Ambulatory Visit: Payer: Medicare Other | Attending: Orthopedic Surgery

## 2021-02-13 ENCOUNTER — Other Ambulatory Visit: Payer: Self-pay

## 2021-02-13 DIAGNOSIS — M6281 Muscle weakness (generalized): Secondary | ICD-10-CM | POA: Diagnosis not present

## 2021-02-13 DIAGNOSIS — M25512 Pain in left shoulder: Secondary | ICD-10-CM | POA: Diagnosis not present

## 2021-02-13 DIAGNOSIS — M25612 Stiffness of left shoulder, not elsewhere classified: Secondary | ICD-10-CM | POA: Insufficient documentation

## 2021-02-13 DIAGNOSIS — M62838 Other muscle spasm: Secondary | ICD-10-CM | POA: Insufficient documentation

## 2021-02-13 NOTE — Therapy (Signed)
[facility] [address]  [place], [place], [address] Phone: [phone]   Fax:  [phone]  Physical Therapy Treatment  Patient Details  Name: [name] MRN: [id number] Date of Birth: 05/10/1946 Referring Provider (PT): Marcene Duos, MD   Encounter Date: 02/13/2021   PT End of Session - 02/13/21 1110     Visit Number 17    Number of Visits 24    Date for PT Re-Evaluation 03/22/21    Authorization Type Medicare & Tricare    PT Start Time 1103    PT Stop Time 1144    PT Time Calculation (min) 41 min    Activity Tolerance Patient tolerated treatment well    Behavior During Therapy [facility] for tasks assessed/performed             Past Medical History:  Diagnosis Date   Abnormal CT scan, gastrointestinal tract suspect angioedema    Anemia 03/16/2016   IRON   Anxiety    Arthritis    Back pain 10/10/2012   Fatty liver 2005   seen on 2005 ultrasound,05/2015 CT.    Hemorrhoids 08/22/2016   Hemorrhoids, internal, with bleeding 2005   internal and external. 2005 band ligation (dr Ferdinand Lango in [facility])   Hyperglycemia 08/21/2013   Hyperlipidemia    Hypertension    Nocturia 08/21/2013   Obesity (BMI 30.0-34.9)    Osteoporosis    Pneumonia    as a child   Rectal bleeding 07/30/2014   Renal cyst    SBO (small bowel obstruction) ([place])    Tubular adenoma of colon before 2005, 2012, 08/2014   Unspecified constipation 02/06/2014   Vitamin D deficiency     Past Surgical History:  Procedure Laterality Date   COLONOSCOPY  before 2005, 2012, 2013, 08/2014.    ENTEROSCOPY N/A 10/05/2017   Procedure: ENTEROSCOPY;  Surgeon: Gatha Mayer, MD;  Location: [facility];  Service: Endoscopy;  Laterality: N/A;   FRACTURE SURGERY Left 1989   leg   HEMORRHOID BANDING  2005   KNEE SURGERY Left 2010   arthroscopy, torn carilage   KNEE SURGERY Right 2012   arthroscopy   TONSILLECTOMY  1957   TONSILLECTOMY     TOTAL  SHOULDER ARTHROPLASTY Left 11/01/2020   Procedure: LEFT ANATOMIC SHOULDER REPLACEMENT;  Surgeon: Meredith Pel, MD;  Location: [facility];  Service: Orthopedics;  Laterality: Left;   VENTRAL HERNIA REPAIR N/A 06/03/2018   Procedure: LAPAROSCOPIC VENTRAL WALL  HERNIA  REPAIR WITH MESH  ERAS PATHWAY;  Surgeon: Michael Boston, MD;  Location: [facility];  Service: General;  Laterality: N/A;    There were no vitals filed for this visit.   Subjective Assessment - 02/13/21 1105     Subjective Pt reports he has been doing well, doing exercises at home    Pertinent History osteoporosis, HTN, HLD, hyperglycemia, anxiety, anemia, L LE fx surgery 1989, L knee scope 2010, R knee scope 2012    Limitations Lifting;House hold activities;Writing    Diagnostic tests none recent    Patient Stated Goals decrease pain, improve motion    Currently in Pain? Yes    Pain Score 1     Pain Location Shoulder    Pain Orientation Left                [facility] PT Assessment - 02/13/21 0001       Strength   Left Shoulder Flexion 4-/5    Left Shoulder ABduction 4-/5  Left Shoulder Internal Rotation 4-/5    Left Shoulder External Rotation 4/5                           [facility] Adult PT Treatment/Exercise - 02/13/21 0001       Exercises   Exercises Shoulder      Shoulder Exercises: Seated   External Rotation Strengthening;Left;10 reps;Weights    Theraband Level (Shoulder External Rotation) Level 3 (Green)    External Rotation Weight (lbs) 2    External Rotation Limitations arm rested on pball; also 10 reps with green TB    Internal Rotation Strengthening;Left;10 reps;Theraband    Theraband Level (Shoulder Internal Rotation) Level 3 (Green)    Flexion Strengthening;Left;10 reps;Weights    Flexion Weight (lbs) 2    Abduction Strengthening;Left;10 reps;Weights    ABduction Weight (lbs) 2      Shoulder Exercises: Standing   Other Standing Exercises wall pushups 2 x 10      Shoulder Exercises:  Pulleys   Flexion 3 minutes    Flexion Limitations to tolerance, added end range stretch    Scaption 3 minutes    Scaption Limitations to tolerance,      Shoulder Exercises: ROM/Strengthening   Lat Pull Limitations 10# 10 reps    Wall Wash L shoulder flexion and scaption with 1# weight 10 reps    Other ROM/Strengthening Exercises CW/CCW with 1# 2x10                      PT Short Term Goals - 01/25/21 1106       PT SHORT TERM GOAL #1   Title Patient to be independent with initial HEP.    Time 3    Period Weeks    Status Achieved    Target Date 12/25/20               PT Long Term Goals - 01/25/21 1106       PT LONG TERM GOAL #1   Title Patient to be independent with advanced HEP.    Time 8    Status Partially Met    Target Date 03/22/21      PT LONG TERM GOAL #2   Title Patient to demonstrate L UE strength >/=4+/5.    Time 8    Status On-going   all muscles below 4+/5   Target Date 03/22/21      PT LONG TERM GOAL #3   Title Patient to demonstrate L shoulder AROM WFL and without pain limiting.    Time 8    Status On-going   improving, still limited   Target Date 03/22/21      PT LONG TERM GOAL #4   Title Patient to lift 5lbs overhead with L UE with good scapular mechanics and no pain.    Time 8    Status On-going   not yet able   Target Date 03/22/21      PT LONG TERM GOAL #5   Title Patient to return to gym with modifications as needed.    Time 8    Status On-going   has not yet returned   Target Date 03/22/21                   Plan - 02/13/21 1152     Clinical Impression Statement Pt reportedly has been very compliant with HEP and increasing reps with resistance as needed. Strength test reveal slight  improvmement but still graded below 4+/5. Cues needed with ER/IR exercises to isolate the RTC and prevent compensation with the bicep muscles. He notes improvements with L shoulder AROM but functionally limited with strength reaching  in multiple planes. Pt responded well to treatment and would continue to need to work on L shoulder strength to improve functional mobility.    Personal Factors and Comorbidities Age;Comorbidity 3+;Past/Current Experience;Time since onset of injury/illness/exacerbation    Comorbidities osteoporosis, HTN, HLD, hyperglycemia, anxiety, anemia, L LE fx surgery 1989, L knee scope 2010, R knee scope 2012    PT Frequency 1x / week    PT Duration 8 weeks    PT Treatment/Interventions ADLs/Self Care Home Management;Cryotherapy;Electrical Stimulation;Iontophoresis 51m/ml Dexamethasone;Moist Heat;Balance training;Therapeutic exercise;Therapeutic activities;Functional mobility training;Ultrasound;Neuromuscular re-education;Patient/family education;Manual techniques;Vasopneumatic Device;Taping;Energy conservation;Dry needling;Passive range of motion;Scar mobilization    PT Next Visit Plan progress shoulder strengthening and AROM; functional movement patterns    Consulted and Agree with Plan of Care Patient             Patient will benefit from skilled therapeutic intervention in order to improve the following deficits and impairments:  Impaired flexibility, Postural dysfunction, Improper body mechanics, Decreased range of motion, Increased muscle spasms, Pain, Impaired UE functional use, Increased fascial restricitons, Decreased strength, Decreased activity tolerance, Increased edema, Decreased scar mobility, Hypomobility  Visit Diagnosis: Acute pain of left shoulder  Stiffness of left shoulder, not elsewhere classified  Muscle weakness (generalized)  Other muscle spasm     Problem List 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 [place] [address]Phone: [phone]  Fax:  [phone] Name: Charles NunneryMRN: [id number] of Birth: [birth year]

## 2021-02-20 ENCOUNTER — Other Ambulatory Visit: Payer: Self-pay

## 2021-02-20 ENCOUNTER — Ambulatory Visit: Payer: Medicare Other

## 2021-02-20 DIAGNOSIS — M25512 Pain in left shoulder: Secondary | ICD-10-CM

## 2021-02-20 DIAGNOSIS — M25612 Stiffness of left shoulder, not elsewhere classified: Secondary | ICD-10-CM

## 2021-02-20 DIAGNOSIS — M6281 Muscle weakness (generalized): Secondary | ICD-10-CM | POA: Diagnosis not present

## 2021-02-20 DIAGNOSIS — M62838 Other muscle spasm: Secondary | ICD-10-CM

## 2021-02-20 NOTE — Therapy (Signed)
[facility] [address]  [place], [place], [address] Phone: [phone]   Fax:  [phone]  Physical Therapy Treatment  Patient Details  Name: [name] MRN: [id number] Date of Birth: Oct 21, 1945 Referring Provider (PT): Marcene Duos, MD   Encounter Date: 02/20/2021   PT End of Session - 02/20/21 1013     Visit Number 18    Number of Visits 24    Date for PT Re-Evaluation 03/22/21    Authorization Type Medicare & Tricare    PT Start Time 0932    PT Stop Time 1012    PT Time Calculation (min) 40 min    Activity Tolerance Patient tolerated treatment well    Behavior During Therapy [facility] for tasks assessed/performed             Past Medical History:  Diagnosis Date   Abnormal CT scan, gastrointestinal tract suspect angioedema    Anemia 03/16/2016   IRON   Anxiety    Arthritis    Back pain 10/10/2012   Fatty liver 2005   seen on 2005 ultrasound,05/2015 CT.    Hemorrhoids 08/22/2016   Hemorrhoids, internal, with bleeding 2005   internal and external. 2005 band ligation (dr Ferdinand Lango in [facility])   Hyperglycemia 08/21/2013   Hyperlipidemia    Hypertension    Nocturia 08/21/2013   Obesity (BMI 30.0-34.9)    Osteoporosis    Pneumonia    as a child   Rectal bleeding 07/30/2014   Renal cyst    SBO (small bowel obstruction) ([place])    Tubular adenoma of colon before 2005, 2012, 08/2014   Unspecified constipation 02/06/2014   Vitamin D deficiency     Past Surgical History:  Procedure Laterality Date   COLONOSCOPY  before 2005, 2012, 2013, 08/2014.    ENTEROSCOPY N/A 10/05/2017   Procedure: ENTEROSCOPY;  Surgeon: Gatha Mayer, MD;  [facility];  Service: Endoscopy;  Laterality: N/A;   FRACTURE SURGERY Left 1989   leg   HEMORRHOID BANDING  2005   KNEE SURGERY Left 2010   arthroscopy, torn carilage   KNEE SURGERY Right 2012   arthroscopy   TONSILLECTOMY  1957   TONSILLECTOMY     TOTAL  SHOULDER ARTHROPLASTY Left 11/01/2020   Procedure: LEFT ANATOMIC SHOULDER REPLACEMENT;  Surgeon: Meredith Pel, MD;  Location: [facility];  Service: Orthopedics;  Laterality: Left;   VENTRAL HERNIA REPAIR N/A 06/03/2018   Procedure: LAPAROSCOPIC VENTRAL WALL  HERNIA  REPAIR WITH MESH  ERAS PATHWAY;  Surgeon: Michael Boston, MD;  [facility]: [facility];  Service: General;  Laterality: N/A;    There were no vitals filed for this visit.   Subjective Assessment - 02/20/21 0935     Subjective Shoulder feels a little tight, R shoulder hurting a little more.    Pertinent History osteoporosis, HTN, HLD, hyperglycemia, anxiety, anemia, L LE fx surgery 1989, L knee scope 2010, R knee scope 2012    Diagnostic tests none recent    Patient Stated Goals decrease pain, improve motion    Currently in Pain? No/denies                               [facility] Adult PT Treatment/Exercise - 02/20/21 0001       Shoulder Exercises: Seated   Flexion Strengthening;Left;10 reps;Weights    Flexion Weight (lbs) 3    Abduction Strengthening;Left;10 reps;Weights  ABduction Weight (lbs) 3      Shoulder Exercises: ROM/Strengthening   UBE (Upper Arm Bike) L1.0 3 min each way    Lat Pull Limitations 20# 2x10 reps    Cybex Row Limitations 20# 2x10 reps    Other ROM/Strengthening Exercises cabinet reaching with 2# weight flex and scaption 10 reps each    Other ROM/Strengthening Exercises CW/CCW circles with ball on wall 30 sec      Manual Therapy   Manual Therapy Soft tissue mobilization    Soft tissue mobilization STM to L anterolateral deltoid                      PT Short Term Goals - 01/25/21 1106       PT SHORT TERM GOAL #1   Title Patient to be independent with initial HEP.    Time 3    Period Weeks    Status Achieved    Target Date 12/25/20               PT Long Term Goals - 01/25/21 1106       PT LONG TERM GOAL #1   Title Patient to be independent with advanced  HEP.    Time 8    Status Partially Met    Target Date 03/22/21      PT LONG TERM GOAL #2   Title Patient to demonstrate L UE strength >/=4+/5.    Time 8    Status On-going   all muscles below 4+/5   Target Date 03/22/21      PT LONG TERM GOAL #3   Title Patient to demonstrate L shoulder AROM WFL and without pain limiting.    Time 8    Status On-going   improving, still limited   Target Date 03/22/21      PT LONG TERM GOAL #4   Title Patient to lift 5lbs overhead with L UE with good scapular mechanics and no pain.    Time 8    Status On-going   not yet able   Target Date 03/22/21      PT LONG TERM GOAL #5   Title Patient to return to gym with modifications as needed.    Time 8    Status On-going   has not yet returned   Target Date 03/22/21                   Plan - 02/20/21 1013     Clinical Impression Statement Pt still demonstrates weakness with OH motions. Progressed exercises with weight machines and gave instruction for doing exercises at the gym for when he returns. No reports of pain with any of the exercises today. He did report pulling and waking up at night sometimes due to pain along his incision site. Finished session with STM to the anterolateral portion of the L shoulder to decrease pain and stiffness in the musculature. Cues given as needed to prevent compensations with the trunk during OH reaching. Pt responded well.    Personal Factors and Comorbidities Age;Comorbidity 3+;Past/Current Experience;Time since onset of injury/illness/exacerbation    Comorbidities osteoporosis, HTN, HLD, hyperglycemia, anxiety, anemia, L LE fx surgery 1989, L knee scope 2010, R knee scope 2012    PT Frequency 1x / week    PT Duration 8 weeks    PT Treatment/Interventions ADLs/Self Care Home Management;Cryotherapy;Electrical Stimulation;Iontophoresis 79m/ml Dexamethasone;Moist Heat;Balance training;Therapeutic exercise;Therapeutic activities;Functional mobility  training;Ultrasound;Neuromuscular re-education;Patient/family education;Manual techniques;Vasopneumatic Device;Taping;Energy conservation;Dry needling;Passive range of motion;Scar  mobilization    PT Next Visit Plan progress shoulder strengthening and AROM; functional movement patterns    Consulted and Agree with Plan of Care Patient             Patient will benefit from skilled therapeutic intervention in order to improve the following deficits and impairments:  Impaired flexibility, Postural dysfunction, Improper body mechanics, Decreased range of motion, Increased muscle spasms, Pain, Impaired UE functional use, Increased fascial restricitons, Decreased strength, Decreased activity tolerance, Increased edema, Decreased scar mobility, Hypomobility  Visit Diagnosis: Acute pain of left shoulder  Stiffness of left shoulder, not elsewhere classified  Muscle weakness (generalized)  Other muscle spasm     Problem List 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   Skin tags, multiple acquired 07/07/2019   LUQ pain 07/07/2019   Primary osteoarthritis of right knee 03/16/2019   Ventral hernia s/p lap repair with mesh 06/03/2018 06/03/2018   Hemorrhoids 08/22/2016   Anemia 03/16/2016   Right shoulder pain 02/26/2016   Abnormal CT scan, gastrointestinal tract suspect angioedema    Abdominal pain    Hyperglycemia 08/21/2013   Low back pain 10/10/2012   Hyperlipidemia    Hypertension, essential     History of colon polyps    Obesity (BMI 30.0-34.9)     Artist Pais, PTA 02/20/2021, 10:50 AM  [facility] [address]  [place], [place], [address] Phone: [phone]   Fax:  [phone]  Name: [name] MRN: [id number] Date of Birth: 02-26-1946

## 2021-02-25 ENCOUNTER — Other Ambulatory Visit (HOSPITAL_BASED_OUTPATIENT_CLINIC_OR_DEPARTMENT_OTHER): Payer: Self-pay

## 2021-02-25 ENCOUNTER — Telehealth: Payer: Self-pay | Admitting: Orthopedic Surgery

## 2021-02-25 MED ORDER — AMOXICILLIN 500 MG PO CAPS
ORAL_CAPSULE | ORAL | 0 refills | Status: DC
  Filled 2021-02-25: qty 4, 4d supply, fill #0

## 2021-02-25 NOTE — Telephone Encounter (Signed)
Patient called advised he is at the dentist office and asked if he needed an antibiotic prior to getting dental work done. I read to patient that for doctor Deans patient's an antibiotic is needed prior to any dental procedures. Patient asked if an antibiotic can be called into the pharmacy for him. Patient said will reschedule his dental appointment. The number to contact patient is [phone]

## 2021-02-25 NOTE — Telephone Encounter (Signed)
Pt called again regarding the message below

## 2021-02-25 NOTE — Telephone Encounter (Signed)
Contacted pharmacy and called into antibiotics for patient. Also made phone call to the patient to let him know that this medication has been called into his pharmacy.

## 2021-02-27 ENCOUNTER — Ambulatory Visit: Payer: Medicare Other

## 2021-02-27 ENCOUNTER — Other Ambulatory Visit: Payer: Self-pay

## 2021-02-27 DIAGNOSIS — M25512 Pain in left shoulder: Secondary | ICD-10-CM

## 2021-02-27 DIAGNOSIS — M62838 Other muscle spasm: Secondary | ICD-10-CM | POA: Diagnosis not present

## 2021-02-27 DIAGNOSIS — M25612 Stiffness of left shoulder, not elsewhere classified: Secondary | ICD-10-CM | POA: Diagnosis not present

## 2021-02-27 DIAGNOSIS — M6281 Muscle weakness (generalized): Secondary | ICD-10-CM | POA: Diagnosis not present

## 2021-02-27 NOTE — Therapy (Signed)
[facility] [address]  [place], [place], [address] Phone: [phone]   Fax:  [phone]  Physical Therapy Treatment  Patient Details  Name: [name] MRN: [id number] Date of Birth: 05/20/1946 Referring Provider (PT): Marcene Duos, MD   Encounter Date: 02/27/2021   PT End of Session - 02/27/21 1018     Visit Number 19    Number of Visits 24    Date for PT Re-Evaluation 03/22/21    Authorization Type Medicare & Tricare    PT Start Time 0933    PT Stop Time 6659    PT Time Calculation (min) 41 min    Activity Tolerance Patient tolerated treatment well    Behavior During Therapy [facility] for tasks assessed/performed             Past Medical History:  Diagnosis Date   Abnormal CT scan, gastrointestinal tract suspect angioedema    Anemia 03/16/2016   IRON   Anxiety    Arthritis    Back pain 10/10/2012   Fatty liver 2005   seen on 2005 ultrasound,05/2015 CT.    Hemorrhoids 08/22/2016   Hemorrhoids, internal, with bleeding 2005   internal and external. 2005 band ligation (dr Ferdinand Lango in [facility])   Hyperglycemia 08/21/2013   Hyperlipidemia    Hypertension    Nocturia 08/21/2013   Obesity (BMI 30.0-34.9)    Osteoporosis    Pneumonia    as a child   Rectal bleeding 07/30/2014   Renal cyst    SBO (small bowel obstruction) ([place])    Tubular adenoma of colon before 2005, 2012, 08/2014   Unspecified constipation 02/06/2014   Vitamin D deficiency     Past Surgical History:  Procedure Laterality Date   COLONOSCOPY  before 2005, 2012, 2013, 08/2014.    ENTEROSCOPY N/A 10/05/2017   Procedure: ENTEROSCOPY;  Surgeon: Gatha Mayer, MD;  Location: [facility];  Service: Endoscopy;  Laterality: N/A;   FRACTURE SURGERY Left 1989   leg   HEMORRHOID BANDING  2005   KNEE SURGERY Left 2010   arthroscopy, torn carilage   KNEE SURGERY Right 2012   arthroscopy   TONSILLECTOMY  1957   TONSILLECTOMY     TOTAL  SHOULDER ARTHROPLASTY Left 11/01/2020   Procedure: LEFT ANATOMIC SHOULDER REPLACEMENT;  Surgeon: Meredith Pel, MD;  Location: [facility];  Service: Orthopedics;  Laterality: Left;   VENTRAL HERNIA REPAIR N/A 06/03/2018   Procedure: LAPAROSCOPIC VENTRAL WALL  HERNIA  REPAIR WITH MESH  ERAS PATHWAY;  Surgeon: Michael Boston, MD;  Location: [facility];  Service: General;  Laterality: N/A;    There were no vitals filed for this visit.   Subjective Assessment - 02/27/21 0933     Subjective Still havin soreness all around his L shoulder from exercises.    Pertinent History osteoporosis, HTN, HLD, hyperglycemia, anxiety, anemia, L LE fx surgery 1989, L knee scope 2010, R knee scope 2012    Diagnostic tests none recent    Patient Stated Goals decrease pain, improve motion    Currently in Pain? No/denies                               [facility] Adult PT Treatment/Exercise - 02/27/21 0001       Shoulder Exercises: Standing   External Rotation Strengthening;Left;10 reps;Theraband    Theraband Level (Shoulder External Rotation) Level 1 (Yellow)    ABduction Strengthening;Left;10  reps;Theraband    Theraband Level (Shoulder ABduction) Level 2 (Red)    Row Strengthening;Both;20 reps    Row Limitations TRX; 2x10    Diagonals Strengthening;Left;10 reps;Theraband    Theraband Level (Shoulder Diagonals) Level 2 (Red)    Diagonals Limitations D1 flexion and extension    Other Standing Exercises reverse fly with TRX 10 reps      Shoulder Exercises: ROM/Strengthening   UBE (Upper Arm Bike) L2.0 3 min each way    Other ROM/Strengthening Exercises snow angels 10 reps at wall      Shoulder Exercises: Stretch   Corner Stretch 3 reps    Corner Stretch Limitations 15 seconds in doorway    External Rotation Stretch 3 reps   15 sec                   PT Education - 02/27/21 1018     Education Details HEP update: Access Code: [email or web address]    Person(s) Educated Patient    Methods  Explanation;Demonstration;Handout    Comprehension Verbalized understanding;Returned demonstration;Verbal cues required              PT Short Term Goals - 01/25/21 1106       PT SHORT TERM GOAL #1   Title Patient to be independent with initial HEP.    Time 3    Period Weeks    Status Achieved    Target Date 12/25/20               PT Long Term Goals - 02/27/21 1038       PT LONG TERM GOAL #1   Title Patient to be independent with advanced HEP.    Time 8    Status Partially Met      PT LONG TERM GOAL #2   Title Patient to demonstrate L UE strength >/=4+/5.    Time 8    Status On-going   all muscles below 4+/5     PT LONG TERM GOAL #3   Title Patient to demonstrate L shoulder AROM WFL and without pain limiting.    Time 8    Status On-going   improving, still limited     PT LONG TERM GOAL #4   Title Patient to lift 5lbs overhead with L UE with good scapular mechanics and no pain.    Time 8    Status On-going   able to lift 3# OH     PT LONG TERM GOAL #5   Title Patient to return to gym with modifications as needed.    Time 8    Status On-going   has not yet returned                  Plan - 02/27/21 1039     Clinical Impression Statement Pt still reporting soreness on the anterior portion of his shoulder but this time all around muscle soreness from HEP. We talked about giving adequate rest days in between exercises to allow for muscle recovery. Progressed ther ex to his tolerance, difficulties noted with ER at 90 deg shoulder position-cues required for proper form with this, difficulty also noted with D1 flex and ext. Incorporated ER stretch to reduce tightness and pain on the anterior portion of his shoulder. Pt was able to lift 3# weight overhead last session with good GH joint mechanics, we will keep trying to progress his ability to lift weight OH. Updated HEP and review exercises for form and to target the correct muscles.  Pt is progressing toward  goals.    Personal Factors and Comorbidities Age;Comorbidity 3+;Past/Current Experience;Time since onset of injury/illness/exacerbation    Comorbidities osteoporosis, HTN, HLD, hyperglycemia, anxiety, anemia, L LE fx surgery 1989, L knee scope 2010, R knee scope 2012    PT Frequency 1x / week    PT Duration 8 weeks    PT Treatment/Interventions ADLs/Self Care Home Management;Cryotherapy;Electrical Stimulation;Iontophoresis 37m/ml Dexamethasone;Moist Heat;Balance training;Therapeutic exercise;Therapeutic activities;Functional mobility training;Ultrasound;Neuromuscular re-education;Patient/family education;Manual techniques;Vasopneumatic Device;Taping;Energy conservation;Dry needling;Passive range of motion;Scar mobilization    PT Next Visit Plan progress shoulder strengthening and AROM; functional movement patterns    Consulted and Agree with Plan of Care Patient             Patient will benefit from skilled therapeutic intervention in order to improve the following deficits and impairments:  Impaired flexibility, Postural dysfunction, Improper body mechanics, Decreased range of motion, Increased muscle spasms, Pain, Impaired UE functional use, Increased fascial restricitons, Decreased strength, Decreased activity tolerance, Increased edema, Decreased scar mobility, Hypomobility  Visit Diagnosis: Acute pain of left shoulder  Stiffness of left shoulder, not elsewhere classified  Muscle weakness (generalized)  Other muscle spasm     Problem List 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 [place] [address]Phone: [phone]  Fax:  [phone] Name: Charles HudginsMRN: [id number] of Birth: [birth year]

## 2021-03-06 ENCOUNTER — Ambulatory Visit: Payer: Medicare Other | Attending: Orthopedic Surgery

## 2021-03-06 ENCOUNTER — Other Ambulatory Visit: Payer: Self-pay

## 2021-03-06 DIAGNOSIS — M25612 Stiffness of left shoulder, not elsewhere classified: Secondary | ICD-10-CM | POA: Diagnosis not present

## 2021-03-06 DIAGNOSIS — M6281 Muscle weakness (generalized): Secondary | ICD-10-CM | POA: Diagnosis not present

## 2021-03-06 DIAGNOSIS — M62838 Other muscle spasm: Secondary | ICD-10-CM | POA: Diagnosis not present

## 2021-03-06 DIAGNOSIS — M25512 Pain in left shoulder: Secondary | ICD-10-CM | POA: Diagnosis not present

## 2021-03-06 NOTE — Therapy (Signed)
[facility] [address]  [place], [place], [address] Phone: [phone]   Fax:  [phone]  Physical Therapy Treatment  Patient Details  Name: [name] MRN: [id number] Date of Birth: Jun 30, 1946 Referring Provider (PT): Marcene Duos, MD   Encounter Date: 03/06/2021   PT End of Session - 03/06/21 1018     Visit Number 20   PN/recert on visit 16   Number of Visits 24    Date for PT Re-Evaluation 03/22/21    Authorization Type Medicare & Tricare    PT Start Time 4562    PT Stop Time 5638    PT Time Calculation (min) 39 min    Activity Tolerance Patient tolerated treatment well    Behavior During Therapy [facility] for tasks assessed/performed             Past Medical History:  Diagnosis Date   Abnormal CT scan, gastrointestinal tract suspect angioedema    Anemia 03/16/2016   IRON   Anxiety    Arthritis    Back pain 10/10/2012   Fatty liver 2005   seen on 2005 ultrasound,05/2015 CT.    Hemorrhoids 08/22/2016   Hemorrhoids, internal, with bleeding 2005   internal and external. 2005 band ligation (dr Ferdinand Lango in [facility])   Hyperglycemia 08/21/2013   Hyperlipidemia    Hypertension    Nocturia 08/21/2013   Obesity (BMI 30.0-34.9)    Osteoporosis    Pneumonia    as a child   Rectal bleeding 07/30/2014   Renal cyst    SBO (small bowel obstruction) ([place])    Tubular adenoma of colon before 2005, 2012, 08/2014   Unspecified constipation 02/06/2014   Vitamin D deficiency     Past Surgical History:  Procedure Laterality Date   COLONOSCOPY  before 2005, 2012, 2013, 08/2014.    ENTEROSCOPY N/A 10/05/2017   Procedure: ENTEROSCOPY;  Surgeon: Gatha Mayer, MD;  [facility];  Service: Endoscopy;  Laterality: N/A;   FRACTURE SURGERY Left 1989   leg   HEMORRHOID BANDING  2005   KNEE SURGERY Left 2010   arthroscopy, torn carilage   KNEE SURGERY Right 2012   arthroscopy   TONSILLECTOMY  1957    TONSILLECTOMY     TOTAL SHOULDER ARTHROPLASTY Left 11/01/2020   Procedure: LEFT ANATOMIC SHOULDER REPLACEMENT;  Surgeon: Meredith Pel, MD;  Location: [facility];  Service: Orthopedics;  Laterality: Left;   VENTRAL HERNIA REPAIR N/A 06/03/2018   Procedure: LAPAROSCOPIC VENTRAL WALL  HERNIA  REPAIR WITH MESH  ERAS PATHWAY;  Surgeon: Michael Boston, MD;  Location: [facility];  Service: General;  Laterality: N/A;    There were no vitals filed for this visit.   Subjective Assessment - 03/06/21 0938     Subjective Still sore on the front part of the shoulder, exercise works it out.    Pertinent History osteoporosis, HTN, HLD, hyperglycemia, anxiety, anemia, L LE fx surgery 1989, L knee scope 2010, R knee scope 2012    Diagnostic tests none recent    Patient Stated Goals decrease pain, improve motion    Currently in Pain? No/denies                [facility] PT Assessment - 03/06/21 0001       Strength   Left Shoulder Flexion 4/5    Left Shoulder ABduction 4/5    Left Shoulder Internal Rotation 4/5    Left Shoulder External Rotation 4+/5  [facility] Adult PT Treatment/Exercise - 03/06/21 0001       Shoulder Exercises: Standing   External Rotation Strengthening;Left;10 reps;Theraband    Theraband Level (Shoulder External Rotation) Level 2 (Red)    Diagonals Strengthening;Left;10 reps;Theraband    Theraband Level (Shoulder Diagonals) Level 3 (Green)    Diagonals Limitations D1 flexion 2x10 with trunk rotation to simulate golf swing; D2 flexion 2x10      Shoulder Exercises: ROM/Strengthening   UBE (Upper Arm Bike) L3.0 3 min each way    Lat Pull Limitations 20# standing extension    Cybex Row Limitations 25# 10 reps wide grip    Other ROM/Strengthening Exercises practicing golf swings with golf club 20X                       PT Short Term Goals - 01/25/21 1106       PT SHORT TERM GOAL #1   Title Patient to be independent with initial  HEP.    Time 3    Period Weeks    Status Achieved    Target Date 12/25/20               PT Long Term Goals - 03/06/21 1010       PT LONG TERM GOAL #1   Title Patient to be independent with advanced HEP.    Time 8    Status Partially Met      PT LONG TERM GOAL #2   Title Patient to demonstrate L UE strength >/=4+/5.    Time 8    Status On-going   all muscles below 4+/5 except ER     PT LONG TERM GOAL #3   Title Patient to demonstrate L shoulder AROM WFL and without pain limiting.    Time 8    Status On-going   improving, still limited     PT LONG TERM GOAL #4   Title Patient to lift 5lbs overhead with L UE with good scapular mechanics and no pain.    Time 8    Status On-going   able to lift 3# OH     PT LONG TERM GOAL #5   Title Patient to return to gym with modifications as needed.    Time 8    Status On-going   has not yet returned                  Plan - 03/06/21 1022     Clinical Impression Statement Pt demonstrates improvements in L shoulder strength. He has been doing well with his HEP adding resistance as needed. We practiced more functional movements of the shoulder focusing mostly on golf swings. Also reviewed gym machines to ease the use of gym equipment when he does return. He demonstrated a good performance of the exercises, min cues required for technique. He may be ready to wrap up with PT in the next visit,  he has made good progress.    Personal Factors and Comorbidities Age;Comorbidity 3+;Past/Current Experience;Time since onset of injury/illness/exacerbation    Comorbidities osteoporosis, HTN, HLD, hyperglycemia, anxiety, anemia, L LE fx surgery 1989, L knee scope 2010, R knee scope 2012    PT Frequency 1x / week    PT Duration 8 weeks    PT Treatment/Interventions ADLs/Self Care Home Management;Cryotherapy;Electrical Stimulation;Iontophoresis 4mg /ml Dexamethasone;Moist Heat;Balance training;Therapeutic exercise;Therapeutic  activities;Functional mobility training;Ultrasound;Neuromuscular re-education;Patient/family education;Manual techniques;Vasopneumatic Device;Taping;Energy conservation;Dry needling;Passive range of motion;Scar mobilization    PT Next Visit Plan possible 30 day hold  or D/C; progress shoulder strengthening and AROM; functional movement patterns    Consulted and Agree with Plan of Care Patient             Patient will benefit from skilled therapeutic intervention in order to improve the following deficits and impairments:  Impaired flexibility, Postural dysfunction, Improper body mechanics, Decreased range of motion, Increased muscle spasms, Pain, Impaired UE functional use, Increased fascial restricitons, Decreased strength, Decreased activity tolerance, Increased edema, Decreased scar mobility, Hypomobility  Visit Diagnosis: Acute pain of left shoulder  Stiffness of left shoulder, not elsewhere classified  Muscle weakness (generalized)  Other muscle spasm     Problem List 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  [place], [place], [address] Phone: [phone]   Fax:  [phone]  Name: [name] MRN: [id number] Date of Birth: Aug 14, 1945

## 2021-03-13 ENCOUNTER — Other Ambulatory Visit: Payer: Self-pay

## 2021-03-13 ENCOUNTER — Encounter: Payer: Self-pay | Admitting: Physical Therapy

## 2021-03-13 ENCOUNTER — Ambulatory Visit: Payer: Medicare Other | Admitting: Physical Therapy

## 2021-03-13 DIAGNOSIS — M25512 Pain in left shoulder: Secondary | ICD-10-CM | POA: Diagnosis not present

## 2021-03-13 DIAGNOSIS — M25612 Stiffness of left shoulder, not elsewhere classified: Secondary | ICD-10-CM

## 2021-03-13 DIAGNOSIS — M62838 Other muscle spasm: Secondary | ICD-10-CM

## 2021-03-13 DIAGNOSIS — M6281 Muscle weakness (generalized): Secondary | ICD-10-CM | POA: Diagnosis not present

## 2021-03-13 NOTE — Therapy (Signed)
[facility] [address]  [place], [place], [address] Phone: [phone]   Fax:  [phone]  Physical Therapy Treatment  Patient Details  Name: [name] MRN: [id number] Date of Birth: 1945/11/22 Referring Provider (PT): Marcene Duos, MD   Encounter Date: 03/13/2021   PT End of Session - 03/13/21 0937     Visit Number 21    Number of Visits 24    Date for PT Re-Evaluation 03/22/21    Authorization Type Medicare & Tricare    PT Start Time 0933    PT Stop Time 1012    PT Time Calculation (min) 39 min    Activity Tolerance Patient tolerated treatment well    Behavior During Therapy [facility] for tasks assessed/performed             Past Medical History:  Diagnosis Date   Abnormal CT scan, gastrointestinal tract suspect angioedema    Anemia 03/16/2016   IRON   Anxiety    Arthritis    Back pain 10/10/2012   Fatty liver 2005   seen on 2005 ultrasound,05/2015 CT.    Hemorrhoids 08/22/2016   Hemorrhoids, internal, with bleeding 2005   internal and external. 2005 band ligation (dr Ferdinand Lango in [facility])   Hyperglycemia 08/21/2013   Hyperlipidemia    Hypertension    Nocturia 08/21/2013   Obesity (BMI 30.0-34.9)    Osteoporosis    Pneumonia    as a child   Rectal bleeding 07/30/2014   Renal cyst    SBO (small bowel obstruction) ([place])    Tubular adenoma of colon before 2005, 2012, 08/2014   Unspecified constipation 02/06/2014   Vitamin D deficiency     Past Surgical History:  Procedure Laterality Date   COLONOSCOPY  before 2005, 2012, 2013, 08/2014.    ENTEROSCOPY N/A 10/05/2017   Procedure: ENTEROSCOPY;  Surgeon: Gatha Mayer, MD;  [facility];  Service: Endoscopy;  Laterality: N/A;   FRACTURE SURGERY Left 1989   leg   HEMORRHOID BANDING  2005   KNEE SURGERY Left 2010   arthroscopy, torn carilage   KNEE SURGERY Right 2012   arthroscopy   TONSILLECTOMY  1957   TONSILLECTOMY     TOTAL  SHOULDER ARTHROPLASTY Left 11/01/2020   Procedure: LEFT ANATOMIC SHOULDER REPLACEMENT;  Surgeon: Meredith Pel, MD;  Location: [facility];  Service: Orthopedics;  Laterality: Left;   VENTRAL HERNIA REPAIR N/A 06/03/2018   Procedure: LAPAROSCOPIC VENTRAL WALL  HERNIA  REPAIR WITH MESH  ERAS PATHWAY;  Surgeon: Michael Boston, MD;  [facility];  Service: General;  Laterality: N/A;    There were no vitals filed for this visit.   Subjective Assessment - 03/13/21 0934     Subjective Not as sore as it was, took a couple of days off working it and that helped a lot.    Pertinent History osteoporosis, HTN, HLD, hyperglycemia, anxiety, anemia, L LE fx surgery 1989, L knee scope 2010, R knee scope 2012    Diagnostic tests none recent    Patient Stated Goals decrease pain, improve motion    Currently in Pain? No/denies    Pain Score 0-No pain   gets pain with putting dishes in cabinet 5/10, hold a plate or too   Pain Location Shoulder    Pain Orientation Left    Pain Type Acute pain;Surgical pain                [facility] PT Assessment -  03/13/21 0001       Assessment   Medical Diagnosis s/p L TSA    Referring Provider (PT) Marcene Duos, MD    Onset Date/Surgical Date 11/01/20    Hand Dominance Right    Next MD Visit 03/20/21      AROM   Overall AROM  Deficits    Left Shoulder Flexion 115 Degrees    Left Shoulder ABduction 140 Degrees    Left Shoulder Internal Rotation 50 Degrees   better on L than R by 1 inch   Left Shoulder External Rotation 30 Degrees   symmetric bil     PROM   Left Shoulder Flexion 140 Degrees    Left Shoulder ABduction 120 Degrees    Left Shoulder Internal Rotation 70 Degrees    Left Shoulder External Rotation 35 Degrees      Strength   Left Shoulder Flexion 4+/5    Left Shoulder ABduction 4+/5    Left Shoulder Internal Rotation 4+/5    Left Shoulder External Rotation 4+/5                           [facility] Adult PT Treatment/Exercise -  03/13/21 0001       Exercises   Exercises Shoulder      Shoulder Exercises: ROM/Strengthening   UBE (Upper Arm Bike) L3.0 3 min each way    Other ROM/Strengthening Exercises lifting 5# weight with L hand from counter to 1st shelf 2 x 10      Manual Therapy   Manual Therapy Soft tissue mobilization;Passive ROM    Manual therapy comments supine L shoulder    Soft tissue mobilization TPR to L pectoralis followed by IASTM to L pec, TPR to L subscap with pin and stretch to improve ROM and decrease tightness    Passive ROM L shoulder PROM flexion, abduction, IR, ER to tolerance pain-free                       PT Short Term Goals - 01/25/21 1106       PT SHORT TERM GOAL #1   Title Patient to be independent with initial HEP.    Time 3    Period Weeks    Status Achieved    Target Date 12/25/20               PT Long Term Goals - 03/13/21 0941       PT LONG TERM GOAL #1   Title Patient to be independent with advanced HEP.    Time 8    Status Achieved   met for current, does daily     PT LONG TERM GOAL #2   Title Patient to demonstrate L UE strength >/=4+/5.    Time 8    Status Achieved   03/13/21- 4+/5 L shoulder     PT LONG TERM GOAL #3   Title Patient to demonstrate L shoulder AROM WFL and without pain limiting.    Time 8    Status On-going   9/14- tightness with overhead and ER   Target Date 03/22/21      PT LONG TERM GOAL #4   Title Patient to lift 5lbs overhead with L UE with good scapular mechanics and no pain.    Time 8    Status On-going   9/14- tightness in L pec, but able to perform   Target Date 03/22/21  PT LONG TERM GOAL #5   Title Patient to return to gym with modifications as needed.    Time 8    Status On-going   has not yet returned                  Plan - 03/13/21 1219     Clinical Impression Statement Patient is making good progress and today met LTG #1 and #2, very close to meeting remaining LTG's.  He still report  tightness and pulling mostly with overhead lifting movements, although scapular movement has improved, reports tightness in pecs.  Addressed tightness in both pectoralis and subscap today with manual therapy, after which he was able to lift 5# weight more easily and with less discomfort.  We discussed possibly dry needling these muscles next session if still limiting movement.  We also discussed discharging next session as he is compliant with HEP and recommended working with personal trainer at gym to continue to progress shoulder strength and function safely.    Personal Factors and Comorbidities Age;Comorbidity 3+;Past/Current Experience;Time since onset of injury/illness/exacerbation    Comorbidities osteoporosis, HTN, HLD, hyperglycemia, anxiety, anemia, L LE fx surgery 1989, L knee scope 2010, R knee scope 2012    PT Frequency 1x / week    PT Duration 8 weeks    PT Treatment/Interventions ADLs/Self Care Home Management;Cryotherapy;Electrical Stimulation;Iontophoresis 38m/ml Dexamethasone;Moist Heat;Balance training;Therapeutic exercise;Therapeutic activities;Functional mobility training;Ultrasound;Neuromuscular re-education;Patient/family education;Manual techniques;Vasopneumatic Device;Taping;Energy conservation;Dry needling;Passive range of motion;Scar mobilization    PT Next Visit Plan DN to subscap/pec if needed, D/C and MD note.    Consulted and Agree with Plan of Care Patient             Patient will benefit from skilled therapeutic intervention in order to improve the following deficits and impairments:  Impaired flexibility, Postural dysfunction, Improper body mechanics, Decreased range of motion, Increased muscle spasms, Pain, Impaired UE functional use, Increased fascial restricitons, Decreased strength, Decreased activity tolerance, Increased edema, Decreased scar mobility, Hypomobility  Visit Diagnosis: Acute pain of left shoulder  Stiffness of left shoulder, not elsewhere  classified  Muscle weakness (generalized)  Other muscle spasm     Problem List 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 [place] [address]Phone: [phone]  Fax:  [phone] Name: Charles JoshuaMRN: [id number] of Birth: [birth year]

## 2021-03-19 ENCOUNTER — Other Ambulatory Visit: Payer: Self-pay

## 2021-03-19 ENCOUNTER — Ambulatory Visit: Payer: Medicare Other

## 2021-03-19 DIAGNOSIS — M6281 Muscle weakness (generalized): Secondary | ICD-10-CM | POA: Diagnosis not present

## 2021-03-19 DIAGNOSIS — M25612 Stiffness of left shoulder, not elsewhere classified: Secondary | ICD-10-CM | POA: Diagnosis not present

## 2021-03-19 DIAGNOSIS — M25512 Pain in left shoulder: Secondary | ICD-10-CM | POA: Diagnosis not present

## 2021-03-19 DIAGNOSIS — M62838 Other muscle spasm: Secondary | ICD-10-CM

## 2021-03-19 DIAGNOSIS — N401 Enlarged prostate with lower urinary tract symptoms: Secondary | ICD-10-CM | POA: Diagnosis not present

## 2021-03-19 NOTE — Therapy (Signed)
[facility] [address]  [place], [place], [address] Phone: [phone]   Fax:  [phone]  Physical Therapy Treatment  Patient Details  Name: [name] MRN: [id number] Date of Birth: January 07, 1946 Referring Provider (PT): Marcene Duos, MD   Encounter Date: 03/19/2021    Past Medical History:  Diagnosis Date   Abnormal CT scan, gastrointestinal tract suspect angioedema    Anemia 03/16/2016   IRON   Anxiety    Arthritis    Back pain 10/10/2012   Fatty liver 2005   seen on 2005 ultrasound,05/2015 CT.    Hemorrhoids 08/22/2016   Hemorrhoids, internal, with bleeding 2005   internal and external. 2005 band ligation (dr Ferdinand Lango in [facility])   Hyperglycemia 08/21/2013   Hyperlipidemia    Hypertension    Nocturia 08/21/2013   Obesity (BMI 30.0-34.9)    Osteoporosis    Pneumonia    as a child   Rectal bleeding 07/30/2014   Renal cyst    SBO (small bowel obstruction) ([place])    Tubular adenoma of colon before 2005, 2012, 08/2014   Unspecified constipation 02/06/2014   Vitamin D deficiency     Past Surgical History:  Procedure Laterality Date   COLONOSCOPY  before 2005, 2012, 2013, 08/2014.    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   Procedure: LEFT ANATOMIC SHOULDER REPLACEMENT;  Surgeon: Meredith Pel, MD;  Location: [facility];  Service: Orthopedics;  Laterality: Left;   VENTRAL HERNIA REPAIR N/A 06/03/2018   Procedure: LAPAROSCOPIC VENTRAL WALL  HERNIA  REPAIR WITH MESH  ERAS PATHWAY;  Surgeon: Michael Boston, MD;  [facility];  Service: General;  Laterality:  N/A;    There were no vitals filed for this visit.   Subjective Assessment - 03/19/21 1359     Subjective Shoulder is feeling a little tight.    Pertinent History osteoporosis, HTN, HLD, hyperglycemia, anxiety, anemia, L LE fx surgery 1989, L knee scope 2010, R knee scope 2012    Diagnostic tests none recent    Patient Stated Goals decrease pain, improve motion    Currently in Pain? No/denies                [facility] PT Assessment - 03/19/21 0001       Observation/Other Assessments   Focus on Therapeutic Outcomes (FOTO)  Shoulder: 71%      AROM   Left Shoulder Flexion 130 Degrees    Left Shoulder ABduction 145 Degrees    Left Shoulder Internal Rotation --   FIR to T12   Left Shoulder External Rotation --   FER to C7                          [facility] Adult PT Treatment/Exercise - 03/19/21 0001       Shoulder Exercises: ROM/Strengthening   UBE (Upper Arm Bike) L3.0 3 min each way      Manual Therapy   Manual Therapy Soft tissue mobilization    Soft tissue mobilization STM to L pecs, subscap,  infraspinatus, LS    Myofascial Release manual TPR to L LS, infraspinatus, subscap                     PT Education - 03/19/21 1442     Education Details edu on rest days for home workout regimen and seeking personal trainer if wanted    Person(s) Educated Patient    Methods Explanation    Comprehension Verbalized understanding              PT Short Term Goals - 01/25/21 1106       PT SHORT TERM GOAL #1   Title Patient to be independent with initial HEP.    Time 3    Period Weeks    Status Achieved    Target Date 12/25/20               PT Long Term Goals - 03/19/21 1402       PT LONG TERM GOAL #1   Title Patient to be independent with advanced HEP.    Time 8    Status Achieved   met for current, does daily     PT LONG TERM GOAL #2   Title Patient to demonstrate L UE strength >/=4+/5.    Time 8    Status Achieved   03/13/21-  4+/5 L shoulder     PT LONG TERM GOAL #3   Title Patient to demonstrate L shoulder AROM WFL and without pain limiting.    Time 8    Status Achieved      PT LONG TERM GOAL #4   Title Patient to lift 5lbs overhead with L UE with good scapular mechanics and no pain.    Time 8    Status Achieved      PT LONG TERM GOAL #5   Title Patient to return to gym with modifications as needed.    Time 8    Status On-going   returned recently to light weight machines for general fitness                  Plan - 03/19/21 1443     Clinical Impression Statement Pt has met LTG 3 and 4 today. FOTO score has reached 71% which is the predicted D/C score. He denied needing to review HEP. Consulted with him about taking rest days ~1-2 days with exercises to avoid ongoing soreness and seeking out to personal trainer if still interested. He demonstrated improvements with L shoulder AROM after manual. Addressed any concerns that he had with gym equipment to ensure understanding of machines and to avoid lifting heavy weights. He has met his LTGs except for #5, he will be D/C at this time due to his exceptional progress.    Personal Factors and Comorbidities Age;Comorbidity 3+;Past/Current Experience;Time since onset of injury/illness/exacerbation    Comorbidities osteoporosis, HTN, HLD, hyperglycemia, anxiety, anemia, L LE fx surgery 1989, L knee scope 2010, R knee scope 2012    PT Frequency 1x / week    PT Duration 8 weeks    PT Treatment/Interventions ADLs/Self Care Home Management;Cryotherapy;Electrical Stimulation;Iontophoresis 44m/ml Dexamethasone;Moist Heat;Balance training;Therapeutic exercise;Therapeutic activities;Functional mobility training;Ultrasound;Neuromuscular re-education;Patient/family education;Manual techniques;Vasopneumatic Device;Taping;Energy conservation;Dry needling;Passive range of motion;Scar mobilization    PT Next Visit Plan DN to subscap/pec if needed, D/C and MD note.     Consulted and Agree with Plan of Care Patient             Patient will benefit from skilled therapeutic intervention  in order to improve the following deficits and impairments:  Impaired flexibility, Postural dysfunction, Improper body mechanics, Decreased range of motion, Increased muscle spasms, Pain, Impaired UE functional use, Increased fascial restricitons, Decreased strength, Decreased activity tolerance, Increased edema, Decreased scar mobility, Hypomobility  Visit Diagnosis: Acute pain of left shoulder  Stiffness of left shoulder, not elsewhere classified  Muscle weakness (generalized)  Other muscle spasm     Problem List 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   Skin tags, multiple acquired 07/07/2019   LUQ pain 07/07/2019   Primary osteoarthritis of right knee 03/16/2019   Ventral hernia s/p lap repair with mesh 06/03/2018 06/03/2018   Hemorrhoids 08/22/2016   Anemia 03/16/2016   Right shoulder pain 02/26/2016   Abnormal CT scan, gastrointestinal tract suspect angioedema    Abdominal pain    Hyperglycemia 08/21/2013   Low back pain 10/10/2012   Hyperlipidemia    Hypertension, essential     History of colon polyps    Obesity (BMI 30.0-34.9)     Artist Pais, PTA 03/19/2021, 5:56 PM  [facility] [address]  [place], [place], [address] Phone: [phone]   Fax:  [phone]  Name: [name] MRN: [id number] Date of Birth: [birth year]

## 2021-03-20 ENCOUNTER — Ambulatory Visit (INDEPENDENT_AMBULATORY_CARE_PROVIDER_SITE_OTHER): Payer: Medicare Other | Admitting: Orthopedic Surgery

## 2021-03-20 ENCOUNTER — Encounter: Payer: Self-pay | Admitting: Orthopedic Surgery

## 2021-03-20 DIAGNOSIS — Z96612 Presence of left artificial shoulder joint: Secondary | ICD-10-CM

## 2021-03-20 NOTE — Progress Notes (Signed)
Post-Op Visit Note   Patient: [name]           Date of Birth: Apr 08, 1946           MRN: [id number] Visit Date: 03/20/2021 PCP: Mosie Lukes, MD   Assessment & Plan:  Chief Complaint:  Chief Complaint  Patient presents with   Left Shoulder - Routine Post Op     11/01/20 (72m 16d) Meredith Pel, MD  Left Anatomic Shoulder Replacement - Left     Visit Diagnoses:  1. S/P shoulder replacement, left     Plan: [name] is a 75 year old patient is now about 5 months out left shoulder replacement.  Overall doing better.  Still has some occasional soreness in the anterior aspect of the shoulder.  He is going to the gym and doing aerobic exercise as well as machine work.  He is able to lay on that left-hand side.  Takes occasional ibuprofen.  Using shoulder pulley.  Doing home exercise program.  On examination he has good deltoid strength and external rotation to about 50 degrees with solid endpoint.  Subscap strength infraspinatus supraspinatus strength intact.  Passive range of motion is 40/85/140.  Overall his range of motion has improved.  Plan is 7 months return final check and repeat radiographs at that time.  Follow-Up Instructions: Return in about 7 months (around 10/18/2021).   Orders:  No orders of the defined types were placed in this encounter.  No orders of the defined types were placed in this encounter.   Imaging: No results found.  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   Skin tags, multiple acquired 07/07/2019   LUQ pain 07/07/2019   Primary osteoarthritis of right knee 03/16/2019   Ventral hernia s/p lap repair with mesh 06/03/2018 06/03/2018   Hemorrhoids 08/22/2016   Anemia 03/16/2016   Right shoulder pain 02/26/2016   Abnormal CT scan, gastrointestinal tract suspect angioedema    Abdominal pain    Hyperglycemia 08/21/2013   Low back pain 10/10/2012    Hyperlipidemia    Hypertension, essential     History of colon polyps    Obesity (BMI 30.0-34.9)    Past Medical History:  Diagnosis Date   Abnormal CT scan, gastrointestinal tract suspect angioedema    Anemia 03/16/2016   IRON   Anxiety    Arthritis    Back pain 10/10/2012   Fatty liver 2005   seen on 2005 ultrasound,05/2015 CT.    Hemorrhoids 08/22/2016   Hemorrhoids, internal, with bleeding 2005   internal and external. 2005 band ligation (dr Ferdinand Lango in [facility])   Hyperglycemia 08/21/2013   Hyperlipidemia    Hypertension    Nocturia 08/21/2013   Obesity (BMI 30.0-34.9)    Osteoporosis    Pneumonia    as a child   Rectal bleeding 07/30/2014   Renal cyst    SBO (small bowel obstruction) ([facility])    Tubular adenoma of colon before 2005, 2012, 08/2014   Unspecified constipation 02/06/2014   Vitamin D deficiency     Family History  Problem Relation Age of Onset   Stroke Mother    Aneurysm Mother        brain   Arthritis Father    Liver cancer Father        liver, atomic testing in [place]   Gallbladder disease Father    Hyperlipidemia Sister    Hypertension Sister  Obesity Sister    Diabetes Sister    Diabetes Brother    Hyperlipidemia Sister    Hypertension Sister    Obesity Sister    Diabetes Sister    Aneurysm Brother        brain   COPD Brother    Gallbladder disease Sister        x4   Colon cancer Neg Hx    Colon polyps Neg Hx    Esophageal cancer Neg Hx    Rectal cancer Neg Hx    Stomach cancer Neg Hx    Prostate cancer Neg Hx    CAD Neg Hx     Past Surgical History:  Procedure Laterality Date   COLONOSCOPY  before 2005, 2012, 2013, 08/2014.    ENTEROSCOPY N/A 10/05/2017   Procedure: ENTEROSCOPY;  Surgeon: Gatha Mayer, MD;  [facility];  Service: Endoscopy;  Laterality: N/A;   FRACTURE SURGERY Left 1989   leg   HEMORRHOID BANDING  2005   KNEE SURGERY Left 2010   arthroscopy, torn carilage   KNEE SURGERY Right 2012   arthroscopy    TONSILLECTOMY  1957   TONSILLECTOMY     TOTAL SHOULDER ARTHROPLASTY Left 11/01/2020   Procedure: LEFT ANATOMIC SHOULDER REPLACEMENT;  Surgeon: Meredith Pel, MD;  Location: [place] [facility];  Service: Orthopedics;  Laterality: Left;   VENTRAL HERNIA REPAIR N/A 06/03/2018   Procedure: LAPAROSCOPIC VENTRAL WALL  HERNIA  REPAIR WITH MESH  ERAS PATHWAY;  Surgeon: Michael Boston, MD;  [facility];  Service: General;  Laterality: N/A;   Social History   Occupational History   Occupation: Retired    Fish farm manager: [employer]  Tobacco Use   Smoking status: Never   Smokeless tobacco: Never  Vaping Use   Vaping Use: Never used  Substance and Sexual Activity   Alcohol use: Yes    Alcohol/week: 2.0 standard drinks    Types: 2 Standard drinks or equivalent per week    Comment: Occassionally   Drug use: No   Sexual activity: Yes

## 2021-03-20 NOTE — Therapy (Addendum)
[facility] [address]  [place], [place], [address] Phone: [phone]   Fax:  [phone]  Physical Therapy Discharge PHYSICAL THERAPY DISCHARGE SUMMARY  Visits from Start of Care: 22  Current functional level related to goals / functional outcomes: FOTO 71%, functional AROM of L shoulder   Remaining deficits: Difficulty with overhead lifting >5lbs   Education / Equipment: HEP  Plan: Patient agrees to discharge.  Patient is being discharged due to meeting the stated rehab goals.     Rennie Natter, PT, DPT   Patient Details  Name: [name] MRN: [id number] Date of Birth: October 20, 1945 Referring Provider (PT): Marcene Duos, MD   Encounter Date: 03/19/2021 PT End of Session       Visit Number 22    Number of Visits 24     Date for PT Re-Evaluation 03/22/21     Authorization Type Medicare & Tricare     PT Start Time 1400     PT Stop Time 7106     PT Time Calculation (min) 39 min     Activity Tolerance Patient tolerated treatment well     Behavior During Therapy [facility] for tasks assessed/performed        Past Medical History:  Diagnosis Date   Abnormal CT scan, gastrointestinal tract suspect angioedema    Anemia 03/16/2016   IRON   Anxiety    Arthritis    Back pain 10/10/2012   Fatty liver 2005   seen on 2005 ultrasound,05/2015 CT.    Hemorrhoids 08/22/2016   Hemorrhoids, internal, with bleeding 2005   internal and external. 2005 band ligation (dr Ferdinand Lango in [facility])   Hyperglycemia 08/21/2013   Hyperlipidemia    Hypertension    Nocturia 08/21/2013   Obesity (BMI 30.0-34.9)    Osteoporosis    Pneumonia    as a child   Rectal bleeding 07/30/2014   Renal cyst    SBO (small bowel obstruction) ([place])    Tubular adenoma of colon before 2005, 2012, 08/2014   Unspecified constipation 02/06/2014   Vitamin D deficiency     Past Surgical History:  Procedure Laterality Date   COLONOSCOPY   before 2005, 2012, 2013, 08/2014.    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   Procedure: LEFT ANATOMIC SHOULDER REPLACEMENT;  Surgeon: Meredith Pel, MD;  Location: [facility] [facility];  Service: Orthopedics;  Laterality: Left;   VENTRAL HERNIA REPAIR N/A 06/03/2018   Procedure: LAPAROSCOPIC VENTRAL WALL  HERNIA  REPAIR WITH MESH  ERAS PATHWAY;  Surgeon: Michael Boston, MD;  Location: [facility];  Service: General;  Laterality: N/A;    There were no vitals filed for this visit.   Subjective Assessment - 03/19/21 1359     Subjective Shoulder is feeling a little tight.    Pertinent History osteoporosis, HTN, HLD, hyperglycemia, anxiety, anemia, L LE fx surgery 1989, L knee scope 2010, R knee scope 2012    Diagnostic tests none recent    Patient Stated Goals decrease pain, improve motion    Currently in Pain? No/denies  [facility] PT Assessment - 03/19/21 0001                Observation/Other Assessments    Focus on Therapeutic Outcomes (FOTO)  Shoulder: 71%          AROM    Left Shoulder Flexion 130 Degrees     Left Shoulder ABduction 145 Degrees     Left Shoulder Internal Rotation --   FIR to T12    Left Shoulder External Rotation --   FER to C7      [facility] Adult PT Treatment/Exercise - 03/19/21 0001                Shoulder Exercises: ROM/Strengthening    UBE (Upper Arm Bike) L3.0 3 min each way          Manual Therapy    Manual Therapy Soft tissue mobilization     Soft tissue mobilization STM to L pecs, subscap, infraspinatus, LS     Myofascial Release manual TPR to L LS, infraspinatus, subscap                              PT Education - 03/19/21 1442     Education Details edu on rest days for home workout regimen and seeking personal trainer if wanted    Person(s) Educated Patient    Methods Explanation    Comprehension Verbalized understanding              PT Short Term Goals - 01/25/21 1106       PT SHORT TERM GOAL #1   Title Patient to be independent with initial HEP.    Time 3    Period Weeks    Status Achieved    Target Date 12/25/20               PT Long Term Goals - 03/19/21 1402       PT LONG TERM GOAL #1   Title Patient to be independent with advanced HEP.    Time 8    Status Achieved   met for current, does daily     PT LONG TERM GOAL #2   Title Patient to demonstrate L UE strength >/=4+/5.    Time 8    Status Achieved   03/13/21- 4+/5 L shoulder     PT LONG TERM GOAL #3   Title Patient to demonstrate L shoulder AROM WFL and without pain limiting.    Time 8    Status Achieved      PT LONG TERM GOAL #4   Title Patient to lift 5lbs overhead with L UE with good scapular mechanics and no pain.    Time 8    Status Achieved      PT LONG TERM GOAL #5   Title Patient to return to gym with modifications as needed.    Time 8    Status On-going   returned recently to light weight machines for general fitness                  Plan - 03/19/21 1443     Clinical Impression Statement Pt has met LTG 3 and 4 today. FOTO score has reached 71% which is the predicted D/C score. He denied needing to review HEP. Consulted with him about taking rest days ~1-2 days with exercises to avoid ongoing soreness and seeking out to personal trainer if still interested. He demonstrated improvements with L shoulder  AROM after manual. Addressed any concerns that he had with gym equipment to ensure understanding of machines and to avoid lifting heavy weights. He has met his LTGs except for #5, he will be D/C at this time due to his  exceptional progress.    Personal Factors and Comorbidities Age;Comorbidity 3+;Past/Current Experience;Time since onset of injury/illness/exacerbation    Comorbidities osteoporosis, HTN, HLD, hyperglycemia, anxiety, anemia, L LE fx surgery 1989, L knee scope 2010, R knee scope 2012    PT Frequency 1x / week    PT Duration 8 weeks    PT Treatment/Interventions ADLs/Self Care Home Management;Cryotherapy;Electrical Stimulation;Iontophoresis 51m/ml Dexamethasone;Moist Heat;Balance training;Therapeutic exercise;Therapeutic activities;Functional mobility training;Ultrasound;Neuromuscular re-education;Patient/family education;Manual techniques;Vasopneumatic Device;Taping;Energy conservation;Dry needling;Passive range of motion;Scar mobilization    PT Next Visit Plan DN to subscap/pec if needed, D/C and MD note.    Consulted and Agree with Plan of Care Patient             Patient will benefit from skilled therapeutic intervention in order to improve the following deficits and impairments:  Impaired flexibility, Postural dysfunction, Improper body mechanics, Decreased range of motion, Increased muscle spasms, Pain, Impaired UE functional use, Increased fascial restricitons, Decreased strength, Decreased activity tolerance, Increased edema, Decreased scar mobility, Hypomobility  Visit Diagnosis: Acute pain of left shoulder  Stiffness of left shoulder, not elsewhere classified  Muscle weakness (generalized)  Other muscle spasm     Problem List 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 [place] [address]Phone: [phone]  Fax:  [phone] Name: HDiana ArmijoMRN: [id number] of Birth: [birth year]

## 2021-03-26 ENCOUNTER — Ambulatory Visit: Payer: Medicare Other | Attending: Internal Medicine

## 2021-03-26 DIAGNOSIS — R3912 Poor urinary stream: Secondary | ICD-10-CM | POA: Diagnosis not present

## 2021-03-26 DIAGNOSIS — N401 Enlarged prostate with lower urinary tract symptoms: Secondary | ICD-10-CM | POA: Diagnosis not present

## 2021-03-26 DIAGNOSIS — N528 Other male erectile dysfunction: Secondary | ICD-10-CM | POA: Diagnosis not present

## 2021-03-26 DIAGNOSIS — Z23 Encounter for immunization: Secondary | ICD-10-CM

## 2021-03-26 NOTE — Progress Notes (Signed)
   Covid-19 Vaccination Clinic  Name:  [name]    MRN: [id number] DOB: 04-11-1946  03/26/2021  [name] was observed post Covid-19 immunization for 15 minutes without incident. He was provided with Vaccine Information Sheet and instruction to access the V-Safe system.   [name] was instructed to call 911 with any severe reactions post vaccine: Difficulty breathing  Swelling of face and throat  A fast heartbeat  A bad rash all over body  Dizziness and weakness

## 2021-03-27 ENCOUNTER — Encounter: Payer: Non-veteran care | Admitting: Physical Therapy

## 2021-04-03 ENCOUNTER — Other Ambulatory Visit (HOSPITAL_BASED_OUTPATIENT_CLINIC_OR_DEPARTMENT_OTHER): Payer: Self-pay

## 2021-04-03 ENCOUNTER — Other Ambulatory Visit: Payer: Self-pay | Admitting: Family Medicine

## 2021-04-03 MED ORDER — INFLUENZA VAC A&B SA ADJ QUAD 0.5 ML IM PRSY
PREFILLED_SYRINGE | INTRAMUSCULAR | 0 refills | Status: DC
  Filled 2021-04-03: qty 0.5, 1d supply, fill #0

## 2021-04-05 ENCOUNTER — Other Ambulatory Visit (HOSPITAL_BASED_OUTPATIENT_CLINIC_OR_DEPARTMENT_OTHER): Payer: Self-pay

## 2021-04-05 MED ORDER — COVID-19MRNA BIVAL VACC PFIZER 30 MCG/0.3ML IM SUSP
INTRAMUSCULAR | 0 refills | Status: DC
  Filled 2021-04-05: qty 0.3, 1d supply, fill #0

## 2021-04-18 NOTE — Progress Notes (Signed)
Subjective:   [name] is a 75 y.o. male who presents for Medicare Annual/Subsequent preventive examination.  I connected with Avish today by telephone and verified that I am speaking with the correct person using two identifiers. Location patient: home Location provider: work Persons participating in the virtual visit: patient, Marine scientist.    I discussed the limitations, risks, security and privacy concerns of performing an evaluation and management service by telephone and the availability of in person appointments. I also discussed with the patient that there may be a patient responsible charge related to this service. The patient expressed understanding and verbally consented to this telephonic visit.    Interactive audio and video telecommunications were attempted between this provider and patient, however failed, due to patient having technical difficulties OR patient did not have access to video capability.  We continued and completed visit with audio only.  Some vital signs may be absent or patient reported.   Time Spent with patient on telephone encounter: 20 minutes   Review of Systems     Cardiac Risk Factors include: advanced age (>6men, >82 women);male gender;hypertension;dyslipidemia     Objective:    Today's Vitals   04/22/21 0940  Weight: 212 lb (96.2 kg)  Height: 5\' 10"  (1.778 m)   Body mass index is 30.42 kg/m.  Advanced Directives 04/22/2021 12/04/2020 11/01/2020 10/24/2020 04/26/2019 05/24/2018 04/20/2018  Does Patient Have a Medical Advance Directive? Yes Yes Yes Yes No Yes Yes  Type of Paramedic of [place];Living will - [place];Living will [place];Living will - [place];Living will [place];Living will  Does patient want to make changes to medical advance directive? - No - Patient declined No - Patient declined - - No - Patient declined -  Copy of  [place] in Chart? No - copy requested - No - copy requested No - copy requested - No - copy requested No - copy requested  Would patient like information on creating a medical advance directive? - - - - No - Patient declined - -    Current Medications (verified) Outpatient Encounter Medications as of 04/22/2021  Medication Sig   acetaminophen (TYLENOL) 325 MG tablet Take 1-2 tablets (325-650 mg total) by mouth every 6 (six) hours as needed for mild pain (pain score 1-3 or temp > 100.5).   ALPRAZolam (XANAX) 1 MG tablet TAKE 1 TABLET (1 MG TOTAL) BY MOUTH AT BEDTIME AS NEEDED FOR ANXIETY.   amLODipine (NORVASC) 5 MG tablet Take 1 tablet (5 mg total) by mouth daily.   amoxicillin (AMOXIL) 500 MG capsule Take 4 capsules by mouth 1 hour prior to dental procedure   Camphor-Menthol-Methyl Sal (SALONPAS) 3.07-05-08 % PTCH Place 1 patch onto the skin daily as needed (pain).   COVID-19 mRNA bivalent vaccine, Pfizer, injection Inject into the muscle.   docusate sodium (COLACE) 100 MG capsule Take 1 capsule (100 mg total) by mouth 2 (two) times daily.   ferrous fumarate (HEMOCYTE - 106 MG FE) 325 (106 Fe) MG TABS tablet Take 1 tablet (106 mg of iron total) by mouth 2 (two) times daily.   fluticasone (FLONASE) 50 MCG/ACT nasal spray Place 2 sprays into both nostrils daily. (Patient taking differently: Place 2 sprays into both nostrils daily as needed for allergies.)   hydrocortisone (ANUSOL-HC) 25 MG suppository Unwrap and place 1 suppository (25 mg total) rectally 2 (two) times daily.   hyoscyamine (ANASPAZ) 0.125 MG TBDP disintergrating tablet  Place 1 tablet (0.125 mg total) under the tongue every 4 (four) hours as needed for cramping.   influenza vaccine adjuvanted (FLUAD) 0.5 ML injection Inject into the muscle.   losartan (COZAAR) 50 MG tablet TAKE 1 TABLET DAILY   methocarbamol (ROBAXIN) 500 MG tablet Take 1 tablet (500 mg total) by mouth every 6 (six) hours as needed for muscle  spasms.   metoprolol succinate (TOPROL-XL) 50 MG 24 hr tablet TAKE 1 TABLET DAILY   Multiple Vitamins-Minerals (MULTIVITAMIN ADULT PO) Take 2 tablets by mouth daily.   naproxen (NAPROSYN) 500 MG tablet Take one-half tablet (250 mg total) by mouth 2 (two) times daily with a meal.   Pitavastatin Calcium (LIVALO) 2 MG TABS TAKE 1 TABLET DAILY   polyethylene glycol (MIRALAX / GLYCOLAX) packet Take 17 g by mouth daily as needed for moderate constipation.   sildenafil (REVATIO) 20 MG tablet TAKE 1 TO 5 TABLETS BY MOUTH AS NEEDED 1 HOUR PRIOR TO INTERCOURSE   tamsulosin (FLOMAX) 0.4 MG CAPS capsule Take 0.4 mg by mouth daily.   trolamine salicylate (ASPERCREME) 10 % cream Apply 1 application topically as needed for muscle pain.   COVID-19 mRNA Vac-TriS, Pfizer, SUSP injection Inject into the muscle. (Patient not taking: Reported on 04/22/2021)   oxyCODONE (OXY IR/ROXICODONE) 5 MG immediate release tablet Take 1-2 tablets (5-10 mg total) by mouth every 4 (four) hours as needed for moderate pain (pain score 4-6). (Patient not taking: Reported on 04/22/2021)   Tdap (BOOSTRIX) 5-2.5-18.5 LF-MCG/0.5 injection Inject into the muscle. (Patient not taking: Reported on 04/22/2021)   No facility-administered encounter medications on file as of 04/22/2021.    Allergies (verified) Patient has no known allergies.   History: Past Medical History:  Diagnosis Date   Abnormal CT scan, gastrointestinal tract suspect angioedema    Anemia 03/16/2016   IRON   Anxiety    Arthritis    Back pain 10/10/2012   Fatty liver 2005   seen on 2005 ultrasound,05/2015 CT.    Hemorrhoids 08/22/2016   Hemorrhoids, internal, with bleeding 2005   internal and external. 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   Procedure: LEFT ANATOMIC SHOULDER REPLACEMENT;  Surgeon: Meredith Pel, MD;  Location: [place];  Service: Orthopedics;  Laterality: Left;   VENTRAL HERNIA REPAIR N/A 06/03/2018   Procedure: LAPAROSCOPIC VENTRAL WALL  HERNIA  REPAIR WITH MESH  ERAS PATHWAY;  Surgeon: Michael Boston, MD;  Location: [facility];  Service: General;  Laterality: N/A;   Family  History  Problem Relation Age of Onset   Stroke Mother    Aneurysm Mother        brain   Arthritis Father    Liver cancer Father        liver, atomic testing in [place]   Gallbladder disease Father    Hyperlipidemia Sister    Hypertension Sister    Obesity Sister    Diabetes Sister    Diabetes Brother    Hyperlipidemia Sister    Hypertension Sister    Obesity Sister    Diabetes Sister    Aneurysm Brother        brain   COPD Brother    Gallbladder disease Sister        x4   Colon cancer Neg Hx    Colon polyps Neg Hx    Esophageal cancer Neg Hx    Rectal cancer Neg Hx    Stomach cancer Neg Hx    Prostate cancer Neg Hx    CAD Neg Hx    Social History   Socioeconomic History   Marital status: Married    Spouse name: Not on file   Number of children: 1   Years of education: Not on file   Highest education level: Not on file  Occupational History   Occupation: Retired    Fish farm manager: [employer]  Tobacco Use   Smoking status: Never   Smokeless tobacco: Never  Vaping Use   Vaping Use: Never used  Substance and Sexual Activity   Alcohol use: Yes    Alcohol/week: 2.0 standard drinks    Types: 2 Standard drinks or equivalent per week    Comment: Occassionally   Drug use: No   Sexual activity: Yes  Other Topics Concern   Not on file  Social History Narrative   Married - 1 child   Retired from [employer], no tbacco/drug use   Social Determinants of Radio broadcast assistant Strain: Low Risk    Difficulty of Paying Living Expenses: Not hard at all  Food Insecurity: No Food Insecurity   Worried About Charity fundraiser in the Last Year: Never true   Arboriculturist in the Last Year: Never true  Transportation Needs: No Transportation Needs   Lack of Transportation (Medical): No   Lack of Transportation (Non-Medical): No  Physical Activity: Sufficiently Active   Days of Exercise per Week: 4 days   Minutes of Exercise per Session: 40 min  Stress: No Stress Concern Present   Feeling of Stress : Only a little  Social Connections: Engineer, building services of Communication with Friends and Family: More than three times a week   Frequency of Social Gatherings with Friends and Family: More than three times a week   Attends Religious Services: More than 4 times per year   Active Member of [employer] or Organizations: Yes   Attends Music therapist: More than 4 times per year   Marital Status: Married    Tobacco Counseling Counseling given: Not Answered   Clinical Intake:  Pre-visit preparation completed: Yes  Pain : No/denies pain     BMI - recorded: 30.42 Nutritional Status: BMI > 30  Obese Nutritional Risks: None Diabetes: No  How often do you need to have someone help you when you read instructions, pamphlets, or other written materials from your doctor or pharmacy?: 1 - Never  Diabetic?No  Interpreter Needed?:  No  Information entered by ::  Caroleen Hamman LPb   Activities of Daily Living In your present state of health, do you have any difficulty performing the following activities: 04/22/2021 10/24/2020  Hearing? N N  Vision? N N  Difficulty concentrating or making decisions? N N  Walking or climbing stairs? N N  Dressing or bathing? N N  Doing errands, shopping? N N  Preparing Food and eating ? N -  Using the Toilet? N -  In the past six months, have you accidently leaked urine? N -  Do you have problems with loss of bowel control? N -  Managing your Medications? N -  Managing your Finances? N -  Housekeeping or managing your Housekeeping? N -  Some recent data might be hidden    Patient Care Team: Mosie Lukes, MD as PCP - General (Family Medicine) Michael Boston, MD as Consulting Physician (General Surgery) Melina Schools, MD as Consulting Physician (Orthopedic Surgery) Gatha Mayer, MD as Consulting Physician (Gastroenterology)  Indicate any recent Medical Services you may have received from other than [facility] providers in the past year (date may be approximate).     Assessment:   This is a routine wellness examination for [name].  Hearing/Vision screen Hearing Screening - Comments:: No issues Vision Screening - Comments:: Last eye exam-06/2020-Dr. Huttto  Dietary issues and exercise activities discussed: Current Exercise Habits: Home exercise routine, Type of exercise: strength training/weights;stretching, Time (Minutes): 40, Frequency (Times/Week): 4, Weekly Exercise (Minutes/Week): 160, Intensity: Mild, Exercise limited by: None identified   Goals Addressed             This Visit's Progress    Patient Stated       Maintain current healthy lifestyle       Depression Screen PHQ 2/9 Scores 04/22/2021 10/26/2020 04/26/2019 04/20/2018 01/29/2018 08/22/2016 07/05/2015  PHQ - 2 Score 0 0 0 0 0 0 0    Fall Risk Fall Risk  04/22/2021 10/26/2020 04/26/2019 04/20/2018  01/29/2018  Falls in the past year? 0 0 0 No No  Number falls in past yr: 0 0 0 - -  Injury with Fall? 0 0 0 - -  Follow up Falls prevention discussed - - - -    FALL RISK PREVENTION PERTAINING TO THE HOME:  Any stairs in or around the home? Yes  If so, are there any without handrails? No  Home free of loose throw rugs in walkways, pet beds, electrical cords, etc? Yes  Adequate lighting in your home to reduce risk of falls? Yes   ASSISTIVE DEVICES UTILIZED TO PREVENT FALLS:  Life alert? No  Use of a cane, walker or w/c? Yes  Grab bars in the bathroom? No  Shower chair or bench in shower? No  Elevated toilet seat or a handicapped toilet? No   TIMED UP AND GO:  Was the test performed? No . Phone visit   Cognitive Function:Normal cognitive status assessed by this Nurse Health Advisor. No abnormalities found.   MMSE - Mini Mental State Exam 08/22/2016  Orientation to time 5  Orientation to Place 5  Registration 3  Attention/ Calculation 5  Recall 3  Language- name 2 objects 2  Language- repeat 1  Language- follow 3 step command 3  Language- read & follow direction 1  Write a sentence 1  Copy design 1  Total score 30        Immunizations Immunization History  Administered Date(s) Administered   Fluad Quad(high Dose 65+) 03/15/2019, 04/03/2021   Influenza Whole 04/12/2013  Influenza, High Dose Seasonal PF 03/11/2016, 04/07/2017, 04/20/2018   Influenza,inj,Quad PF,6+ Mos 04/12/2015   Influenza-Unspecified 04/13/2014   PFIZER Comirnaty(Gray Top)Covid-19 Tri-Sucrose Vaccine 12/14/2020   PFIZER(Purple Top)SARS-COV-2 Vaccination 07/22/2019, 08/12/2019, 03/27/2020   Pfizer Covid-19 Vaccine Bivalent Booster 1yrs & up 03/26/2021   Pneumococcal Conjugate-13 08/18/2013   Pneumococcal Polysaccharide-23 02/19/2016   Tdap 06/30/2010, 12/04/2020   Zoster Recombinat (Shingrix) 04/20/2018, 07/30/2018   Zoster, Live 06/30/2010    TDAP status: Up to date  Flu Vaccine status:  Up to date  Pneumococcal vaccine status: Up to date  Covid-19 vaccine status: Completed vaccines  Qualifies for Shingles Vaccine? No   Zostavax completed Yes   Shingrix Completed?: Yes  Screening Tests Health Maintenance  Topic Date Due   COLONOSCOPY (Pts 45-11yrs Insurance coverage will need to be confirmed)  08/25/2021   TETANUS/TDAP  12/05/2030   Pneumonia Vaccine 54+ Years old  Completed   INFLUENZA VACCINE  Completed   COVID-19 Vaccine  Completed   Hepatitis C Screening  Completed   Zoster Vaccines- Shingrix  Completed   HPV VACCINES  Aged Out    Health Maintenance  There are no preventive care reminders to display for this patient.   Colorectal cancer screening: Type of screening: Colonoscopy. Completed 08/25/2014. Repeat every 7 years  Lung Cancer Screening: (Low Dose CT Chest recommended if Age 61-80 years, 30 pack-year currently smoking OR have quit w/in 15years.) does not qualify.     Additional Screening:  Hepatitis C Screening: Completed 02/19/2016  Vision Screening: Recommended annual ophthalmology exams for early detection of glaucoma and other disorders of the eye. Is the patient up to date with their annual eye exam?  Yes  Who is the provider or what is the name of the office in which the patient attends annual eye exams? Dr. Laban Emperor   Dental Screening: Recommended annual dental exams for proper oral hygiene  Community Resource Referral / Chronic Care Management: CRR required this visit?  No   CCM required this visit?  No      Plan:     I have personally reviewed and noted the following in the patient's chart:   Medical and social history Use of alcohol, tobacco or illicit drugs  Current medications and supplements including opioid prescriptions. Patient is not currently taking opioid prescriptions. Functional ability and status Nutritional status Physical activity Advanced directives List of other physicians Hospitalizations, surgeries,  and ER visits in previous 12 months Vitals Screenings to include cognitive, depression, and falls Referrals and appointments  In addition, I have reviewed and discussed with patient certain preventive protocols, quality metrics, and best practice recommendations. A written personalized care plan for preventive services as well as general preventive health recommendations were provided to patient.   Due to this being a telephonic visit, the after visit summary with patients personalized plan was offered to patient via mail or my-chart. Patient would like to access on my-chart.   Marta Antu, LPN   09/47/[id number]  Nurse Health Advisor  Nurse Notes: None

## 2021-04-22 ENCOUNTER — Ambulatory Visit (INDEPENDENT_AMBULATORY_CARE_PROVIDER_SITE_OTHER): Payer: Medicare Other

## 2021-04-22 VITALS — Ht 70.0 in | Wt 212.0 lb

## 2021-04-22 DIAGNOSIS — Z0000 Encounter for general adult medical examination without abnormal findings: Secondary | ICD-10-CM

## 2021-04-22 NOTE — Patient Instructions (Signed)
[name] , Thank you for taking time to complete your Medicare Wellness Visit. I appreciate your ongoing commitment to your health goals. Please review the following plan we discussed and let me know if I can assist you in the future.   Screening recommendations/referrals: Colonoscopy: Completed 08/25/2014-Due 08/25/2021 Recommended yearly ophthalmology/optometry visit for glaucoma screening and checkup Recommended yearly dental visit for hygiene and checkup  Vaccinations: Influenza vaccine: Up to date Pneumococcal vaccine: Up to date Tdap vaccine: Up to date-Due-12/05/2030 Shingles vaccine: Completed vaccines   Covid-19: Up to date  Advanced directives: Please bring a copy of Living Will and/or Healthcare Power of Attorney for your chart.   Conditions/risks identified: See problem list  Next appointment: Follow up in one year for your annual wellness visit. 04/24/2022 @ 9:40  Preventive Care 65 Years and Older, Male Preventive care refers to lifestyle choices and visits with your health care provider that can promote health and wellness. What does preventive care include? A yearly physical exam. This is also called an annual well check. Dental exams once or twice a year. Routine eye exams. Ask your health care provider how often you should have your eyes checked. Personal lifestyle choices, including: Daily care of your teeth and gums. Regular physical activity. Eating a healthy diet. Avoiding tobacco and drug use. Limiting alcohol use. Practicing safe sex. Taking low doses of aspirin every day. Taking vitamin and mineral supplements as recommended by your health care provider. What happens during an annual well check? The services and screenings done by your health care provider during your annual well check will depend on your age, overall health, lifestyle risk factors, and family history of disease. Counseling  Your health care provider may ask you questions about  your: Alcohol use. Tobacco use. Drug use. Emotional well-being. Home and relationship well-being. Sexual activity. Eating habits. History of falls. Memory and ability to understand (cognition). Work and work Statistician. Screening  You may have the following tests or measurements: Height, weight, and BMI. Blood pressure. Lipid and cholesterol levels. These may be checked every 5 years, or more frequently if you are over 28 years old. Skin check. Lung cancer screening. You may have this screening every year starting at age 53 if you have a 30-pack-year history of smoking and currently smoke or have quit within the past 15 years. Fecal occult blood test (FOBT) of the stool. You may have this test every year starting at age 22. Flexible sigmoidoscopy or colonoscopy. You may have a sigmoidoscopy every 5 years or a colonoscopy every 10 years starting at age 89. Prostate cancer screening. Recommendations will vary depending on your family history and other risks. Hepatitis C blood test. Hepatitis B blood test. Sexually transmitted disease (STD) testing. Diabetes screening. This is done by checking your blood sugar (glucose) after you have not eaten for a while (fasting). You may have this done every 1-3 years. Abdominal aortic aneurysm (AAA) screening. You may need this if you are a current or former smoker. Osteoporosis. You may be screened starting at age 4 if you are at high risk. Talk with your health care provider about your test results, treatment options, and if necessary, the need for more tests. Vaccines  Your health care provider may recommend certain vaccines, such as: Influenza vaccine. This is recommended every year. Tetanus, diphtheria, and acellular pertussis (Tdap, Td) vaccine. You may need a Td booster every 10 years. Zoster vaccine. You may need this after age 109. Pneumococcal 13-valent conjugate (PCV13) vaccine. One  dose is recommended after age 22. Pneumococcal  polysaccharide (PPSV23) vaccine. One dose is recommended after age 42. Talk to your health care provider about which screenings and vaccines you need and how often you need them. This information is not intended to replace advice given to you by your health care provider. Make sure you discuss any questions you have with your health care provider. Document Released: 07/13/2015 Document Revised: 03/05/2016 Document Reviewed: 04/17/2015 Elsevier Interactive Patient Education  2017 [employer] Prevention in the Home Falls can cause injuries. They can happen to people of all ages. There are many things you can do to make your home safe and to help prevent falls. What can I do on the outside of my home? Regularly fix the edges of walkways and driveways and fix any cracks. Remove anything that might make you trip as you walk through a door, such as a raised step or threshold. Trim any bushes or trees on the path to your home. Use bright outdoor lighting. Clear any walking paths of anything that might make someone trip, such as rocks or tools. Regularly check to see if handrails are loose or broken. Make sure that both sides of any steps have handrails. Any raised decks and porches should have guardrails on the edges. Have any leaves, snow, or ice cleared regularly. Use sand or salt on walking paths during winter. Clean up any spills in your garage right away. This includes oil or grease spills. What can I do in the bathroom? Use night lights. Install grab bars by the toilet and in the tub and shower. Do not use towel bars as grab bars. Use non-skid mats or decals in the tub or shower. If you need to sit down in the shower, use a plastic, non-slip stool. Keep the floor dry. Clean up any water that spills on the floor as soon as it happens. Remove soap buildup in the tub or shower regularly. Attach bath mats securely with double-sided non-slip rug tape. Do not have throw rugs and other  things on the floor that can make you trip. What can I do in the bedroom? Use night lights. Make sure that you have a light by your bed that is easy to reach. Do not use any sheets or blankets that are too big for your bed. They should not hang down onto the floor. Have a firm chair that has side arms. You can use this for support while you get dressed. Do not have throw rugs and other things on the floor that can make you trip. What can I do in the kitchen? Clean up any spills right away. Avoid walking on wet floors. Keep items that you use a lot in easy-to-reach places. If you need to reach something above you, use a strong step stool that has a grab bar. Keep electrical cords out of the way. Do not use floor polish or wax that makes floors slippery. If you must use wax, use non-skid floor wax. Do not have throw rugs and other things on the floor that can make you trip. What can I do with my stairs? Do not leave any items on the stairs. Make sure that there are handrails on both sides of the stairs and use them. Fix handrails that are broken or loose. Make sure that handrails are as long as the stairways. Check any carpeting to make sure that it is firmly attached to the stairs. Fix any carpet that is loose or worn.  Avoid having throw rugs at the top or bottom of the stairs. If you do have throw rugs, attach them to the floor with carpet tape. Make sure that you have a light switch at the top of the stairs and the bottom of the stairs. If you do not have them, ask someone to add them for you. What else can I do to help prevent falls? Wear shoes that: Do not have high heels. Have rubber bottoms. Are comfortable and fit you well. Are closed at the toe. Do not wear sandals. If you use a stepladder: Make sure that it is fully opened. Do not climb a closed stepladder. Make sure that both sides of the stepladder are locked into place. Ask someone to hold it for you, if possible. Clearly  mark and make sure that you can see: Any grab bars or handrails. First and last steps. Where the edge of each step is. Use tools that help you move around (mobility aids) if they are needed. These include: Canes. Walkers. Scooters. Crutches. Turn on the lights when you go into a dark area. Replace any light bulbs as soon as they burn out. Set up your furniture so you have a clear path. Avoid moving your furniture around. If any of your floors are uneven, fix them. If there are any pets around you, be aware of where they are. Review your medicines with your doctor. Some medicines can make you feel dizzy. This can increase your chance of falling. Ask your doctor what other things that you can do to help prevent falls. This information is not intended to replace advice given to you by your health care provider. Make sure you discuss any questions you have with your health care provider. Document Released: 04/12/2009 Document Revised: 11/22/2015 Document Reviewed: 07/21/2014 Elsevier Interactive Patient Education  2017 [employer].

## 2021-05-06 ENCOUNTER — Encounter: Payer: Self-pay | Admitting: Medical

## 2021-05-06 ENCOUNTER — Ambulatory Visit (HOSPITAL_BASED_OUTPATIENT_CLINIC_OR_DEPARTMENT_OTHER)
Admission: RE | Admit: 2021-05-06 | Discharge: 2021-05-06 | Disposition: A | Discharge status: Home / Self Care | Payer: Medicare Other | Source: Ambulatory Visit | Attending: Medical | Admitting: Medical

## 2021-05-06 ENCOUNTER — Ambulatory Visit (INDEPENDENT_AMBULATORY_CARE_PROVIDER_SITE_OTHER): Payer: Medicare Other | Admitting: Medical

## 2021-05-06 ENCOUNTER — Other Ambulatory Visit: Payer: Self-pay

## 2021-05-06 VITALS — BP 130/80 | HR 79 | Temp 98.2°F | Resp 18 | Ht 70.0 in | Wt 218.0 lb

## 2021-05-06 DIAGNOSIS — L821 Other seborrheic keratosis: Secondary | ICD-10-CM

## 2021-05-06 DIAGNOSIS — R0789 Other chest pain: Secondary | ICD-10-CM

## 2021-05-06 DIAGNOSIS — E785 Hyperlipidemia, unspecified: Secondary | ICD-10-CM

## 2021-05-06 DIAGNOSIS — R739 Hyperglycemia, unspecified: Secondary | ICD-10-CM

## 2021-05-06 DIAGNOSIS — D649 Anemia, unspecified: Secondary | ICD-10-CM | POA: Diagnosis not present

## 2021-05-06 DIAGNOSIS — H6123 Impacted cerumen, bilateral: Secondary | ICD-10-CM | POA: Diagnosis not present

## 2021-05-06 DIAGNOSIS — I10 Essential (primary) hypertension: Secondary | ICD-10-CM

## 2021-05-06 DIAGNOSIS — E611 Iron deficiency: Secondary | ICD-10-CM | POA: Diagnosis not present

## 2021-05-06 DIAGNOSIS — R0781 Pleurodynia: Secondary | ICD-10-CM

## 2021-05-06 LAB — CBC WITH DIFFERENTIAL/PLATELET
Basophils Absolute: 0 10*3/uL (ref 0.0–0.1)
Basophils Relative: 0.7 % (ref 0.0–3.0)
Eosinophils Absolute: 0.3 10*3/uL (ref 0.0–0.7)
Eosinophils Relative: 6.2 % — ABNORMAL HIGH (ref 0.0–5.0)
HCT: 40.1 % (ref 39.0–52.0)
Hemoglobin: 13.4 g/dL (ref 13.0–17.0)
Lymphocytes Relative: 39.4 % (ref 12.0–46.0)
Lymphs Abs: 1.8 10*3/uL (ref 0.7–4.0)
MCHC: 33.4 g/dL (ref 30.0–36.0)
MCV: 86.4 fl (ref 78.0–100.0)
Monocytes Absolute: 0.5 10*3/uL (ref 0.1–1.0)
Monocytes Relative: 10.4 % (ref 3.0–12.0)
Neutro Abs: 2 10*3/uL (ref 1.4–7.7)
Neutrophils Relative %: 43.3 % (ref 43.0–77.0)
Platelets: 199 10*3/uL (ref 150.0–400.0)
RBC: 4.64 Mil/uL (ref 4.22–5.81)
RDW: 13.4 % (ref 11.5–15.5)
WBC: 4.5 10*3/uL (ref 4.0–10.5)

## 2021-05-06 LAB — LIPID PANEL
Cholesterol: 141 mg/dL (ref 0–200)
HDL: 40 mg/dL (ref >39.00)
LDL Cholesterol: 85 mg/dL (ref 0–99)
NonHDL: 101.4
Total CHOL/HDL Ratio: 4
Triglycerides: 84 mg/dL (ref 0.0–149.0)
VLDL: 16.8 mg/dL (ref 0.0–40.0)

## 2021-05-06 LAB — HEMOGLOBIN A1C: Hgb A1c MFr Bld: 5.8 % (ref 4.6–6.5)

## 2021-05-06 LAB — COMPREHENSIVE METABOLIC PANEL
ALT: 30 U/L (ref 0–53)
AST: 22 U/L (ref 0–37)
Albumin: 4.4 g/dL (ref 3.5–5.2)
Alkaline Phosphatase: 84 U/L (ref 39–117)
BUN: 14 mg/dL (ref 6–23)
CO2: 27 mEq/L (ref 19–32)
Calcium: 9.4 mg/dL (ref 8.4–10.5)
Chloride: 106 mEq/L (ref 96–112)
Creatinine, Ser: 1.09 mg/dL (ref 0.40–1.50)
GFR: 67.48 mL/min (ref >60.00)
Glucose, Bld: 113 mg/dL — ABNORMAL HIGH (ref 70–99)
Potassium: 4.2 mEq/L (ref 3.5–5.1)
Sodium: 139 mEq/L (ref 135–145)
Total Bilirubin: 0.6 mg/dL (ref 0.2–1.2)
Total Protein: 7.2 g/dL (ref 6.0–8.3)

## 2021-05-06 LAB — IRON: Iron: 68 ug/dL (ref 42–165)

## 2021-05-06 NOTE — Progress Notes (Signed)
Subjective:    Patient ID: [name], male    DOB: [birth year], 75 y.o.   MRN: [id number]  HPI  Pt in for routine visit/check up chronic med problems.   Hx of htn. On losartan, toprol-xl and amlodipine. When pt checks his bp at home 130/80.   High cholesterol and on livalo.  Mild elevated sugar but a1c not in diabetic range.   Pt has appointment to see dermatologist in December.  He saw urologist in September or October. Pt urologist gave him flomax.   Pt is in process of getting scheduled for repeat colonoscopy.  Pt has had his flu vaccine, covid booster and states in summer he got tdap.   Pt notes on and off left lower rib pain for 3 years. No injury or fall. No shortness of breath. Some pain today. Some pain on twisting and when he palpates.  Hx of anxiety. He takes xanax. About 1-2 times a week at most if he can't sleep. He has 15 tabs left of former 30 tab rx.  Pt states in past has some periodic cerumen impactions. He can hear now just wants me to check and tell him amount of wax present. Last lavage with ent this summer.   Review of Systems  Constitutional:  Negative for chills, fatigue and fever.  HENT:  Negative for congestion.   Respiratory:  Negative for choking, shortness of breath and wheezing.   Cardiovascular:  Negative for chest pain and palpitations.  Gastrointestinal:  Negative for abdominal pain, constipation, diarrhea and nausea.  Genitourinary:  Negative for dysuria and frequency.  Musculoskeletal:  Negative for back pain, joint swelling, myalgias and neck pain.  Skin:  Negative for rash.  Neurological:  Negative for dizziness, speech difficulty, numbness and headaches.  Hematological:  Negative for adenopathy. Does not bruise/bleed easily.  Psychiatric/Behavioral:  Negative for behavioral problems and dysphoric mood. The patient is not nervous/anxious.     Past Medical History:  Diagnosis Date   Abnormal CT scan, gastrointestinal tract suspect  angioedema    Anemia 03/16/2016   IRON   Anxiety    Arthritis    Back pain 10/10/2012   Fatty liver 2005   seen on 2005 ultrasound,05/2015 CT.    Hemorrhoids 08/22/2016   Hemorrhoids, internal, with bleeding 2005   internal and external. 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, 2012, 08/2014   Unspecified constipation 02/06/2014   Vitamin D deficiency      Social History   Socioeconomic History   Marital status: Married    Spouse name: Not on file   Number of children: 1   Years of education: Not on file   Highest education level: Not on file  Occupational History   Occupation: Retired    Fish farm manager: [employer]  Tobacco Use   Smoking status: Never   Smokeless tobacco: Never  Vaping Use   Vaping Use: Never used  Substance and Sexual Activity   Alcohol use: Yes    Alcohol/week: 2.0 standard drinks    Types: 2 Standard drinks or equivalent per week    Comment: Occassionally   Drug use: No  Sexual activity: Yes  Other Topics Concern   Not on file  Social History Narrative   Married - 1 child   Retired from [employer], no tbacco/drug use   Social Determinants of Radio broadcast assistant Strain: Low Risk    Difficulty of Paying Living Expenses: Not hard at all  Food Insecurity: No Food Insecurity   Worried About Charity fundraiser in the Last Year: Never true   Arboriculturist in the Last Year: Never true  Transportation Needs: No Transportation Needs   Lack of Transportation (Medical): No   Lack of Transportation (Non-Medical): No  Physical Activity: Sufficiently Active   Days of Exercise per Week: 4 days   Minutes of Exercise per Session: 40 min  Stress: No Stress  Concern Present   Feeling of Stress : Only a little  Social Connections: Engineer, building services of Communication with Friends and Family: More than three times a week   Frequency of Social Gatherings with Friends and Family: More than three times a week   Attends Religious Services: More than 4 times per year   Active Member of Clubs or Organizations: Yes   Attends Archivist Meetings: More than 4 times per year   Marital Status: Married  Human resources officer Violence: Not At Risk   Fear of Current or Ex-Partner: No   Emotionally Abused: No   Physically Abused: No   Sexually Abused: No    Past Surgical History:  Procedure Laterality Date   COLONOSCOPY  before 2005, 2012, 2013, 08/2014.    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   Procedure: LEFT ANATOMIC SHOULDER REPLACEMENT;  Surgeon: Meredith Pel, MD;  Location: [facility];  Service: Orthopedics;  Laterality: Left;   VENTRAL HERNIA REPAIR N/A 06/03/2018   Procedure: LAPAROSCOPIC VENTRAL WALL  HERNIA  REPAIR WITH MESH  ERAS PATHWAY;  Surgeon: Michael Boston, MD;  Location: [facility];  Service: General;  Laterality: N/A;    Family History  Problem Relation Age of Onset   Stroke Mother    Aneurysm Mother        brain   Arthritis Father    Liver cancer Father        liver, atomic testing in [place]   Gallbladder disease Father    Hyperlipidemia Sister    Hypertension Sister    Obesity Sister    Diabetes Sister    Diabetes Brother    Hyperlipidemia Sister    Hypertension Sister    Obesity Sister    Diabetes Sister    Aneurysm Brother        brain   COPD Brother     Gallbladder disease Sister        x4   Colon cancer Neg Hx    Colon polyps Neg Hx    Esophageal cancer Neg Hx    Rectal cancer Neg Hx    Stomach cancer Neg Hx    Prostate cancer Neg Hx    CAD Neg Hx     No Known Allergies  Current Outpatient  Medications on File Prior to Visit  Medication Sig Dispense Refill   acetaminophen (TYLENOL) 325 MG tablet Take 1-2 tablets (325-650 mg total) by mouth every 6 (six) hours as needed for mild pain (pain score 1-3 or temp > 100.5). 100 tablet 0   ALPRAZolam (XANAX) 1 MG tablet TAKE 1 TABLET (1 MG TOTAL) BY MOUTH AT BEDTIME AS NEEDED FOR ANXIETY. 30 tablet 0   amLODipine (NORVASC) 5 MG tablet Take 1 tablet (5 mg total) by mouth daily. 90 tablet 1   amoxicillin (AMOXIL) 500 MG capsule Take 4 capsules by mouth 1 hour prior to dental procedure 4 capsule 0   Camphor-Menthol-Methyl Sal (SALONPAS) 3.07-05-08 % PTCH Place 1 patch onto the skin daily as needed (pain).     COVID-19 mRNA bivalent vaccine, Pfizer, injection Inject into the muscle. 0.3 mL 0   COVID-19 mRNA Vac-TriS, Pfizer, SUSP injection Inject into the muscle. 0.3 mL 0   docusate sodium (COLACE) 100 MG capsule Take 1 capsule (100 mg total) by mouth 2 (two) times daily. 100 capsule 0   ferrous fumarate (HEMOCYTE - 106 MG FE) 325 (106 Fe) MG TABS tablet Take 1 tablet (106 mg of iron total) by mouth 2 (two) times daily. 60 tablet 3   fluticasone (FLONASE) 50 MCG/ACT nasal spray Place 2 sprays into both nostrils daily. (Patient taking differently: Place 2 sprays into both nostrils daily as needed for allergies.) 16 g 3   hydrocortisone (ANUSOL-HC) 25 MG suppository Unwrap and place 1 suppository (25 mg total) rectally 2 (two) times daily. 12 suppository 1   hyoscyamine (ANASPAZ) 0.125 MG TBDP disintergrating tablet Place 1 tablet (0.125 mg total) under the tongue every 4 (four) hours as needed for cramping. 30 tablet 2   influenza vaccine adjuvanted (FLUAD) 0.5 ML injection Inject into the muscle. 0.5  mL 0   losartan (COZAAR) 50 MG tablet TAKE 1 TABLET DAILY 90 tablet 3   methocarbamol (ROBAXIN) 500 MG tablet Take 1 tablet (500 mg total) by mouth every 6 (six) hours as needed for muscle spasms. 30 tablet 0   metoprolol succinate (TOPROL-XL) 50 MG 24 hr tablet TAKE 1 TABLET DAILY 90 tablet 3   Multiple Vitamins-Minerals (MULTIVITAMIN ADULT PO) Take 2 tablets by mouth daily.     naproxen (NAPROSYN) 500 MG tablet Take one-half tablet (250 mg total) by mouth 2 (two) times daily with a meal. 30 tablet 0   oxyCODONE (OXY IR/ROXICODONE) 5 MG immediate release tablet Take 1-2 tablets (5-10 mg total) by mouth every 4 (four) hours as needed for moderate pain (pain score 4-6). 35 tablet 0   Pitavastatin Calcium (LIVALO) 2 MG TABS TAKE 1 TABLET DAILY 90 tablet 1   polyethylene glycol (MIRALAX / GLYCOLAX) packet Take 17 g by mouth daily as needed for moderate constipation.     sildenafil (REVATIO) 20 MG tablet TAKE 1 TO 5 TABLETS BY MOUTH AS NEEDED 1 HOUR PRIOR TO INTERCOURSE 20 tablet 3   tamsulosin (FLOMAX) 0.4 MG CAPS capsule Take 0.4 mg by mouth daily.     Tdap (BOOSTRIX) 5-2.5-18.5 LF-MCG/0.5 injection Inject into the muscle. 0.5 mL 0   trolamine salicylate (ASPERCREME) 10 % cream Apply 1 application topically as needed for muscle pain.     No current facility-administered medications on file prior to visit.    BP (!) 154/90   Pulse 79   Temp 98.2 F (36.8 C)   Resp 18   Ht 5\' 10"  (1.778 m)   Wt 218 lb (98.9  kg)   SpO2 96%   BMI 31.28 kg/m       Objective:   Physical Exam  General Mental Status- Alert. General Appearance- Not in acute distress.   Skin General: Color- Normal Color. Moisture- Normal Moisture.  Neck Carotid Arteries- Normal color. Moisture- Normal Moisture. No carotid bruits. No JVD.  Chest and Lung Exam Auscultation: Breath Sounds:-Normal.  Cardiovascular Auscultation:Rythm- Regular. Murmurs & Other Heart Sounds:Auscultation of the heart reveals- No  Murmurs.  Abdomen Inspection:-Inspeection Normal. Palpation/Percussion:Note:No mass. Palpation and Percussion of the abdomen reveal- Non Tender, Non Distended + BS, no rebound or guarding.   Neurologic Cranial Nerve exam:- CN III-XII intact(No nystagmus), symmetric smile. Strength:- 5/5 equal and symmetric strength both upper and lower extremities.       Assessment & Plan:   Patient Instructions  Hypertension with blood pressure well controlled today on repeat check.  130/80 approximately matches your at home blood pressure readings.  Continue losartan, Toprol and amlodipine.  Hyperlipidemia.  We will check a metabolic panel and lipid panel.  Elevated blood sugar.  We will follow A1c lab today.  Skin lesions.  Keep appointment with dermatologist this December.  Vaccines up-to-date.  Anemia-check CBC.  Low iron follow iron level.  Left lower rib pain intermittent and mild for years.  No fall or trauma.  Decided to go ahead and get left rib x-ray today.  Cerumen impaction bilaterally we estimates about 50% blockage but she can hear well.  Lavage December with ENT.  Recommend watch for any decreased hearing or plugged sensation.  If you get that then use Debrox over-the-counter for 3 to 4 days and then schedule appointment for lavage.  If were not able to clean ears then would refer you to ENT.  Follow-up as regular scheduled with PCP or sooner if lab abnormalities.   Mackie Pai, PA-C

## 2021-05-06 NOTE — Patient Instructions (Signed)
Hypertension with blood pressure well controlled today on repeat check.  130/80 approximately matches your at home blood pressure readings.  Continue losartan, Toprol and amlodipine.  Hyperlipidemia.  We will check a metabolic panel and lipid panel.  Elevated blood sugar.  We will follow A1c lab today.  Skin lesions.  Keep appointment with dermatologist this December.  Vaccines up-to-date.  Anemia-check CBC.  Low iron follow iron level.  Left lower rib pain intermittent and mild for years.  No fall or trauma.  Decided to go ahead and get left rib x-ray today.  Cerumen impaction bilaterally we estimates about 50% blockage but she can hear well.  Lavage December with ENT.  Recommend watch for any decreased hearing or plugged sensation.  If you get that then use Debrox over-the-counter for 3 to 4 days and then schedule appointment for lavage.  If were not able to clean ears then would refer you to ENT.  Follow-up as regular scheduled with PCP or sooner if lab abnormalities.

## 2021-05-15 ENCOUNTER — Encounter: Payer: Self-pay | Admitting: Internal Medicine

## 2021-06-05 DIAGNOSIS — L820 Inflamed seborrheic keratosis: Secondary | ICD-10-CM | POA: Diagnosis not present

## 2021-06-05 DIAGNOSIS — L821 Other seborrheic keratosis: Secondary | ICD-10-CM | POA: Diagnosis not present

## 2021-07-17 ENCOUNTER — Other Ambulatory Visit: Payer: Self-pay

## 2021-07-17 ENCOUNTER — Ambulatory Visit (AMBULATORY_SURGERY_CENTER): Payer: Medicare Other | Admitting: *Deleted

## 2021-07-17 VITALS — Ht 70.0 in | Wt 216.0 lb

## 2021-07-17 DIAGNOSIS — Z86010 Personal history of colonic polyps: Secondary | ICD-10-CM

## 2021-07-17 MED ORDER — NA SULFATE-K SULFATE-MG SULF 17.5-3.13-1.6 GM/177ML PO SOLN: 2.0000 | Freq: Once | ORAL | 0 refills | Status: AC

## 2021-07-17 NOTE — Progress Notes (Signed)
No egg or soy allergy known to patient  No issues known to pt with past sedation with any surgeries or procedures Patient denies ever being told they had issues or difficulty with intubation  No FH of Malignant Hyperthermia Pt is not on diet pills Pt is not on  home 02  Pt is not on blood thinners  Pt denies issues with constipation  No A fib or A flutter  Pt is fully vaccinated  for Co  Due to the COVID-19 pandemic we are asking patients to follow certain guidelines in PV and the [facility]   Pt aware of COVID protocols and [facility] guidelines   PV completed over the phone. Pt verified name, DOB, address and insurance during PV today.  Pt mailed instruction packet with copy of consent form to read and not return, and instructions.  Pt encouraged to call with questions or issues.  If pt has My chart, procedure instructions sent via My Chart

## 2021-07-31 ENCOUNTER — Encounter: Payer: Self-pay | Admitting: Internal Medicine

## 2021-07-31 ENCOUNTER — Ambulatory Visit (AMBULATORY_SURGERY_CENTER): Payer: Medicare Other | Admitting: Internal Medicine

## 2021-07-31 ENCOUNTER — Encounter: Payer: Medicare Other | Admitting: Internal Medicine

## 2021-07-31 VITALS — BP 96/71 | HR 71 | Temp 98.0°F | Resp 10 | Ht 70.0 in | Wt 216.0 lb

## 2021-07-31 DIAGNOSIS — Z86010 Personal history of colonic polyps: Secondary | ICD-10-CM

## 2021-07-31 DIAGNOSIS — D122 Benign neoplasm of ascending colon: Secondary | ICD-10-CM

## 2021-07-31 DIAGNOSIS — D123 Benign neoplasm of transverse colon: Secondary | ICD-10-CM

## 2021-07-31 MED ORDER — SODIUM CHLORIDE 0.9 % IV SOLN: 500.0000 mL | Freq: Once | INTRAVENOUS | Status: DC

## 2021-07-31 NOTE — Progress Notes (Signed)
Called to room to assist during endoscopic procedure.  Patient ID and intended procedure confirmed with present staff. Received instructions for my participation in the procedure from the performing physician.  

## 2021-07-31 NOTE — Progress Notes (Signed)
Sedate, gd SR, tolerated procedure well, VSS, report to RN 

## 2021-07-31 NOTE — Op Note (Signed)
[facility] Patient Name: [name] Procedure Date: 07/31/2021 1:35 PM MRN: [id number] Endoscopist: Gatha Mayer , MD Age: 76 Referring MD:  Date of Birth: [birth year] Gender: Male Account #: [id number] Procedure:                Colonoscopy Indications:              Surveillance: Personal history of adenomatous                            polyps on last colonoscopy > 5 years ago Medicines:                Propofol per Anesthesia, Monitored Anesthesia Care Procedure:                Pre-Anesthesia Assessment:                           - Prior to the procedure, a History and Physical                            was performed, and patient medications and                            allergies were reviewed. The patient's tolerance of                            previous anesthesia was also reviewed. The risks                            and benefits of the procedure and the sedation                            options and risks were discussed with the patient.                            All questions were answered, and informed consent                            was obtained. Prior Anticoagulants: The patient has                            taken no previous anticoagulant or antiplatelet                            agents. ASA Grade Assessment: II - A patient with                            mild systemic disease. After reviewing the risks                            and benefits, the patient was deemed in                            satisfactory condition to undergo the procedure.  After obtaining informed consent, the colonoscope                            was passed under direct vision. Throughout the                            procedure, the patient's blood pressure, pulse, and                            oxygen saturations were monitored continuously. The                            CF [id number] #[id number] was introduced through the anus                             and advanced to the the cecum, identified by                            appendiceal orifice and ileocecal valve. The                            colonoscopy was performed without difficulty. The                            patient tolerated the procedure well. The quality                            of the bowel preparation was good. The ileocecal                            valve, appendiceal orifice, and rectum were                            photographed. Scope In: 1:44:28 PM Scope Out: 1:59:54 PM Scope Withdrawal Time: 0 hours 11 minutes 57 seconds  Total Procedure Duration: 0 hours 15 minutes 26 seconds  Findings:                 The perianal and digital rectal examinations were                            normal.                           Two sessile polyps were found in the transverse                            colon and ascending colon. The polyps were                            diminutive in size. These polyps were removed with                            a cold snare. Resection and retrieval were  complete. Verification of patient identification                            for the specimen was done. Estimated blood loss was                            minimal.                           Multiple diverticula were found in the sigmoid                            colon.                           External and internal hemorrhoids were found.                           The exam was otherwise without abnormality on                            direct and retroflexion views. Complications:            No immediate complications. Estimated Blood Loss:     Estimated blood loss was minimal. Impression:               - Two diminutive polyps in the transverse colon and                            in the ascending colon, removed with a cold snare.                            Resected and retrieved.                           - Diverticulosis in the sigmoid colon.                            - External and internal hemorrhoids.                           - The examination was otherwise normal on direct                            and retroflexion views except for hemorrhoid                            banding scars.                           - Personal history of colonic polyps klast had                            diminutive adenoma 2016. Recommendation:           - Patient has a contact number available for  emergencies. The signs and symptoms of potential                            delayed complications were discussed with the                            patient. Return to normal activities tomorrow.                            Written discharge instructions were provided to the                            patient.                           - Resume previous diet.                           - Continue present medications.                           - No recommendation at this time regarding repeat                            colonoscopy due to age. Gatha Mayer, MD 07/31/2021 2:12:07 PM This report has been signed electronically.

## 2021-07-31 NOTE — Patient Instructions (Addendum)
I found and removed 2 tiny polyps. Look benign, Also saw diverticulosis and hemorrhoids.  I appreciate the opportunity to care for you. Gatha Mayer, MD, FACG  YOU HAD AN ENDOSCOPIC PROCEDURE TODAY AT [facility]:   Refer to the procedure report that was given to you for any specific questions about what was found during the examination.  If the procedure report does not answer your questions, please call your gastroenterologist to clarify.  If you requested that your care partner not be given the details of your procedure findings, then the procedure report has been included in a sealed envelope for you to review at your convenience later.  YOU SHOULD EXPECT: Some feelings of bloating in the abdomen. Passage of more gas than usual.  Walking can help get rid of the air that was put into your GI tract during the procedure and reduce the bloating. If you had a lower endoscopy (such as a colonoscopy or flexible sigmoidoscopy) you may notice spotting of blood in your stool or on the toilet paper. If you underwent a bowel prep for your procedure, you may not have a normal bowel movement for a few days.  Please Note:  You might notice some irritation and congestion in your nose or some drainage.  This is from the oxygen used during your procedure.  There is no need for concern and it should clear up in a day or so.  SYMPTOMS TO REPORT IMMEDIATELY:  Following lower endoscopy (colonoscopy or flexible sigmoidoscopy):  Excessive amounts of blood in the stool  Significant tenderness or worsening of abdominal pains  Swelling of the abdomen that is new, acute  Fever of 100F or higher  Following upper endoscopy (EGD)  Vomiting of blood or coffee ground material  New chest pain or pain under the shoulder blades  Painful or persistently difficult swallowing  New shortness of breath  Fever of 100F or higher  Black, tarry-looking stools  For urgent or emergent issues, a  gastroenterologist can be reached at any hour by calling [phone]. Do not use MyChart messaging for urgent concerns.    DIET:  We do recommend a small meal at first, but then you may proceed to your regular diet.  Drink plenty of fluids but you should avoid alcoholic beverages for 24 hours.  ACTIVITY:  You should plan to take it easy for the rest of today and you should NOT DRIVE or use heavy machinery until tomorrow (because of the sedation medicines used during the test).    FOLLOW UP: Our staff will call the number listed on your records 48-72 hours following your procedure to check on you and address any questions or concerns that you may have regarding the information given to you following your procedure. If we do not reach you, we will leave a message.  We will attempt to reach you two times.  During this call, we will ask if you have developed any symptoms of COVID 19. If you develop any symptoms (ie: fever, flu-like symptoms, shortness of breath, cough etc.) before then, please call [phone].  If you test positive for Covid 19 in the 2 weeks post procedure, please call and report this information to [place].    If any biopsies were taken you will be contacted by phone or by letter within the next 1-3 weeks.  Please call us at [phone] if you have not heard about the biopsies in 3 weeks.    SIGNATURES/CONFIDENTIALITY: You and/or your  care partner have signed paperwork which will be entered into your electronic medical record.  These signatures attest to the fact that that the information above on your After Visit Summary has been reviewed and is understood.  Full responsibility of the confidentiality of this discharge information lies with you and/or your care-partner.

## 2021-07-31 NOTE — Progress Notes (Signed)
[facility] History and Physical   Primary Care Physician:  Mosie Lukes, MD   Reason for Procedure:  Colon polyp surveillance  Plan:    Colonoscopy     HPI: [name] is a 76 y.o. male here for colon polyp surveillance with history as below  Last colonoscopy in [place] with Dr Ferdinand Lango in 2013  was clear per patient, when due for next would like to change providers 07/2014 - diminutive adenoma - repeat colon 2021 UPDATE new guidelines = repeat 2023 Past Medical History:  Diagnosis Date   Abnormal CT scan, gastrointestinal tract suspect angioedema    Anemia 03/16/2016   IRON   Anxiety    Arthritis    Back pain 10/10/2012   Fatty liver 2005   seen on 2005 ultrasound,05/2015 CT.    Hemorrhoids 08/22/2016   Hemorrhoids, internal, with bleeding 2005   internal and external. 2005 band ligation (dr Ferdinand Lango in [facility])   Hyperglycemia 08/21/2013   Hyperlipidemia    Hypertension    Nocturia 08/21/2013   Obesity (BMI 30.0-34.9)    Osteoporosis    Pneumonia    as a child   Rectal bleeding 07/30/2014   Renal cyst    SBO (small bowel obstruction) ([place])    Tubular adenoma of colon before 2005, 2012, 08/2014   Unspecified constipation 02/06/2014   Vitamin D deficiency     Past Surgical History:  Procedure Laterality Date   COLONOSCOPY  before 2005, 2012, 2013, 08/2014.    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   Procedure: LEFT ANATOMIC SHOULDER REPLACEMENT;  Surgeon: Meredith Pel, MD;  Location: [facility];  Service: Orthopedics;  Laterality: Left;   VENTRAL HERNIA REPAIR N/A 06/03/2018   Procedure: LAPAROSCOPIC  VENTRAL WALL  HERNIA  REPAIR WITH MESH  ERAS PATHWAY;  Surgeon: Michael Boston, MD;  Location: [facility];  Service: General;  Laterality: N/A;    Prior to Admission medications   Medication Sig Start Date End Date Taking? Authorizing Provider  amLODipine (NORVASC) 5 MG tablet Take 1 tablet (5 mg total) by mouth daily. 01/18/21  Yes Mosie Lukes, MD  hydrocortisone (ANUSOL-HC) 25 MG suppository Unwrap and place 1 suppository (25 mg total) rectally 2 (two) times daily. 02/06/21  Yes Saguier, Percell Miller, PA-C  hyoscyamine (ANASPAZ) 0.125 MG TBDP disintergrating tablet Place 1 tablet (0.125 mg total) under the tongue every 4 (four) hours as needed for cramping. 01/21/21  Yes Mosie Lukes, MD  losartan (COZAAR) 50 MG tablet TAKE 1 TABLET DAILY 12/24/20  Yes Mosie Lukes, MD  metoprolol succinate (TOPROL-XL) 50 MG 24 hr tablet TAKE 1 TABLET DAILY 12/24/20  Yes Mosie Lukes, MD  Multiple Vitamins-Minerals (MULTIVITAMIN ADULT PO) Take 2 tablets by mouth daily.   Yes [provider]  Pitavastatin Calcium (LIVALO) 2 MG TABS TAKE 1 TABLET DAILY 04/03/21  Yes Mosie Lukes, MD  acetaminophen (TYLENOL) 325 MG tablet Take 1-2 tablets (325-650 mg total) by mouth every 6 (six) hours as needed for mild pain (pain score 1-3 or temp > 100.5). 11/02/20   Meredith Pel,  MD  ALPRAZolam (XANAX) 1 MG tablet TAKE 1 TABLET (1 MG TOTAL) BY MOUTH AT BEDTIME AS NEEDED FOR ANXIETY. 10/26/20   Colon Branch, MD  docusate sodium (COLACE) 100 MG capsule Take 1 capsule (100 mg total) by mouth 2 (two) times daily. Patient not taking: Reported on 07/17/2021 11/02/20   Meredith Pel, MD  fluticasone [facility]) 50 MCG/ACT nasal spray Place 2 sprays into both nostrils daily. Patient not taking: Reported on 07/17/2021 06/20/20   Mosie Lukes, MD  naproxen (NAPROSYN) 500 MG tablet Take one-half tablet (250 mg total) by mouth 2 (two) times daily with a meal. Patient not taking: Reported on 07/17/2021 11/02/20   Meredith Pel, MD  polyethylene glycol [facility] Floria Raveling) packet Take 17 g by mouth daily as needed for moderate constipation. Patient not taking: Reported on 07/17/2021    [provider]  sildenafil (REVATIO) 20 MG tablet TAKE 1 TO 5 TABLETS BY MOUTH AS NEEDED 1 HOUR PRIOR TO INTERCOURSE 01/21/21 01/21/22    tamsulosin (FLOMAX) 0.4 MG CAPS capsule Take 0.4 mg by mouth daily. 03/26/21   [provider]  trolamine salicylate (ASPERCREME) 10 % cream Apply 1 application topically as needed for muscle pain.    [provider]    Current Outpatient Medications  Medication Sig Dispense Refill   amLODipine (NORVASC) 5 MG tablet Take 1 tablet (5 mg total) by mouth daily. 90 tablet 1   hydrocortisone (ANUSOL-HC) 25 MG suppository Unwrap and place 1 suppository (25 mg total) rectally 2 (two) times daily. 12 suppository 1   hyoscyamine (ANASPAZ) 0.125 MG TBDP disintergrating tablet Place 1 tablet (0.125 mg total) under the tongue every 4 (four) hours as needed for cramping. 30 tablet 2   losartan (COZAAR) 50 MG tablet TAKE 1 TABLET DAILY 90 tablet 3   metoprolol succinate (TOPROL-XL) 50 MG 24 hr tablet TAKE 1 TABLET DAILY 90 tablet 3   Multiple Vitamins-Minerals (MULTIVITAMIN ADULT PO) Take 2 tablets by mouth daily.     Pitavastatin Calcium (LIVALO) 2 MG TABS TAKE 1 TABLET DAILY 90 tablet 1   acetaminophen (TYLENOL) 325 MG tablet Take 1-2 tablets (325-650 mg total) by mouth every 6 (six) hours as needed for mild pain (pain score 1-3 or temp > 100.5). 100 tablet 0   ALPRAZolam (XANAX) 1 MG tablet TAKE 1 TABLET (1 MG TOTAL) BY MOUTH AT BEDTIME AS NEEDED FOR ANXIETY. 30 tablet 0   docusate sodium (COLACE) 100 MG capsule Take 1 capsule (100 mg total) by mouth 2 (two) times daily. (Patient not taking: Reported on 07/17/2021) 100 capsule 0   fluticasone (FLONASE) 50 MCG/ACT nasal spray Place 2 sprays into both nostrils daily. (Patient not taking: Reported on 07/17/2021) 16 g 3   naproxen  (NAPROSYN) 500 MG tablet Take one-half tablet (250 mg total) by mouth 2 (two) times daily with a meal. (Patient not taking: Reported on 07/17/2021) 30 tablet 0   polyethylene glycol (MIRALAX / GLYCOLAX) packet Take 17 g by mouth daily as needed for moderate constipation. (Patient not taking: Reported on 07/17/2021)     sildenafil (REVATIO) 20 MG tablet TAKE 1 TO 5 TABLETS BY MOUTH AS NEEDED 1 HOUR PRIOR TO INTERCOURSE 20 tablet 3   tamsulosin (FLOMAX) 0.4 MG CAPS capsule Take 0.4 mg by mouth daily.     trolamine salicylate (ASPERCREME) 10 % cream Apply 1 application topically as needed for muscle pain.     Current Facility-Administered Medications  Medication Dose Route Frequency Provider  Last Rate Last Admin   0.9 %  sodium chloride infusion  500 mL Intravenous Once Gatha Mayer, MD        Allergies as of 07/31/2021   (No Known Allergies)    Family History  Problem Relation Age of Onset   Stroke Mother    Aneurysm Mother        brain   Arthritis Father    Liver cancer Father        liver, atomic testing in [place]   Gallbladder disease Father    Hyperlipidemia Sister    Hypertension Sister    Obesity Sister    Diabetes Sister    Diabetes Brother    Hyperlipidemia Sister    Hypertension Sister    Obesity Sister    Diabetes Sister    Aneurysm Brother        brain   COPD Brother    Gallbladder disease Sister        x4   Colon cancer Neg Hx    Colon polyps Neg Hx    Esophageal cancer Neg Hx    Rectal cancer Neg Hx    Stomach cancer Neg Hx    Prostate cancer Neg Hx    CAD Neg Hx     Social History   Socioeconomic History   Marital status: Married    Spouse name: Not on file   Number of children: 1   Years of education: Not on file   Highest education level: Not on file  Occupational History   Occupation: Retired    Fish farm manager: [employer]  Tobacco Use   Smoking status: Never   Smokeless tobacco: Never  Vaping Use   Vaping Use: Never used  Substance  and Sexual Activity   Alcohol use: Yes    Alcohol/week: 2.0 standard drinks    Types: 2 Standard drinks or equivalent per week    Comment: Occassionally   Drug use: No   Sexual activity: Yes  Other Topics Concern   Not on file  Social History Narrative   Married - 1 child   Retired from [employer], no tbacco/drug use   Social Determinants of Radio broadcast assistant Strain: Low Risk    Difficulty of Paying Living Expenses: Not hard at all  Food Insecurity: No Food Insecurity   Worried About Charity fundraiser in the Last Year: Never true   Arboriculturist in the Last Year: Never true  Transportation Needs: No Transportation Needs   Lack of Transportation (Medical): No   Lack of Transportation (Non-Medical): No  Physical Activity: Sufficiently Active   Days of Exercise per Week: 4 days   Minutes of Exercise per Session: 40 min  Stress: No Stress Concern Present   Feeling of Stress : Only a little  Social Connections: Engineer, building services of Communication with Friends and Family: More than three times a week   Frequency of Social Gatherings with Friends and Family: More than three times a week   Attends Religious Services: More than 4 times per year   Active Member of [employer] or Organizations: Yes   Attends Music therapist: More than 4 times per year   Marital Status: Married  Human resources officer Violence: Not At Risk   Fear of Current or Ex-Partner: No   Emotionally Abused: No   Physically Abused: No   Sexually Abused: No    Review of Systems:  All other review of  systems negative except as mentioned in the HPI.  Physical Exam: Vital signs BP (!) 144/91    Pulse 93    Temp 98 F (36.7 C)    Ht 5\' 10"  (1.778 m)    Wt 216 lb (98 kg)    SpO2 100%    BMI 30.99 kg/m   General:   Alert,  Well-developed, well-nourished, pleasant and cooperative in NAD Lungs:  Clear throughout to auscultation.   Heart:  Regular rate and rhythm; no  murmurs, clicks, rubs,  or gallops. Abdomen:  Soft, nontender and nondistended. Normal bowel sounds.   Neuro/Psych:  Alert and cooperative. Normal mood and affect. A and O x 3   @Danity Schmelzer  Simonne Maffucci, MD, [facility] [phone] (pager) 07/31/2021 1:22 PM@

## 2021-07-31 NOTE — Progress Notes (Signed)
VS by DT  Pt's states no medical or surgical changes since previsit or office visit.  

## 2021-08-02 ENCOUNTER — Telehealth: Payer: Self-pay

## 2021-08-02 ENCOUNTER — Other Ambulatory Visit (HOSPITAL_BASED_OUTPATIENT_CLINIC_OR_DEPARTMENT_OTHER): Payer: Self-pay

## 2021-08-02 NOTE — Telephone Encounter (Signed)
°  Follow up Call-  Call back number 07/31/2021  Post procedure Call Back phone  # [phone]  Permission to leave phone message Yes  Some recent data might be hidden     Patient questions:  Do you have a fever, pain , or abdominal swelling? No. Pain Score  0 *  Have you tolerated food without any problems? Yes.    Have you been able to return to your normal activities? Yes.    Do you have any questions about your discharge instructions: Diet   No. Medications  No. Follow up visit  No.  Do you have questions or concerns about your Care? No.  Actions: * If pain score is 4 or above: No action needed, pain <4.  Have you developed a fever since your procedure? no  2.   Have you had an respiratory symptoms (SOB or cough) since your procedure? no  3.   Have you tested positive for COVID 19 since your procedure no  4.   Have you had any family members/close contacts diagnosed with the COVID 19 since your procedure?  no   If yes to any of these questions please route to Joylene John, RN and Joella Prince, RN

## 2021-08-02 NOTE — Telephone Encounter (Signed)
Follow up call placed, VM obtained and message left. ?SChaplin, RN,BSN ? ?

## 2021-08-14 ENCOUNTER — Encounter: Payer: Self-pay | Admitting: Internal Medicine

## 2021-08-19 ENCOUNTER — Other Ambulatory Visit: Payer: Self-pay | Admitting: Family Medicine

## 2021-09-04 ENCOUNTER — Other Ambulatory Visit: Payer: Self-pay | Admitting: Family Medicine

## 2021-09-11 DIAGNOSIS — H2513 Age-related nuclear cataract, bilateral: Secondary | ICD-10-CM | POA: Diagnosis not present

## 2021-09-19 ENCOUNTER — Ambulatory Visit: Payer: Self-pay

## 2021-09-19 ENCOUNTER — Other Ambulatory Visit: Payer: Self-pay

## 2021-09-19 ENCOUNTER — Ambulatory Visit (HOSPITAL_BASED_OUTPATIENT_CLINIC_OR_DEPARTMENT_OTHER)
Admission: RE | Admit: 2021-09-19 | Discharge: 2021-09-19 | Disposition: A | Discharge status: Home / Self Care | Payer: Medicare Other | Source: Ambulatory Visit | Attending: Family Medicine | Admitting: Family Medicine

## 2021-09-19 ENCOUNTER — Ambulatory Visit (INDEPENDENT_AMBULATORY_CARE_PROVIDER_SITE_OTHER): Payer: Medicare Other | Admitting: Family Medicine

## 2021-09-19 VITALS — BP 138/80 | Ht 70.0 in | Wt 216.0 lb

## 2021-09-19 DIAGNOSIS — M1711 Unilateral primary osteoarthritis, right knee: Secondary | ICD-10-CM

## 2021-09-19 MED ORDER — KETOROLAC TROMETHAMINE 30 MG/ML IJ SOLN
30.0000 mg | Freq: Once | INTRAMUSCULAR | Status: AC
  Administered 2021-09-19: 30 mg via INTRA_ARTICULAR

## 2021-09-19 NOTE — Patient Instructions (Signed)
Good to see you ?Please use ice as needed ?Please try the exercises  ?I will call with the results from today.   ?Please send me a message in MyChart with any questions or updates.  ?We'll call you when the zilretta is approved.  ? ?--Dr. Raeford Razor ? ?

## 2021-09-19 NOTE — Assessment & Plan Note (Signed)
Acute on chronic in nature.  Has tried oral medications as well as previous injections. ?-Counseled on home exercise therapy and supportive care. ?-Toradol intra-articular injection today. ?-Pursue Zilretta injection. ?-X-ray. ?

## 2021-09-19 NOTE — Progress Notes (Signed)
?[name] - 76 y.o. male MRN [id number]  Date of birth: [birth year] ? ?SUBJECTIVE:  Including CC & ROS.  ?No chief complaint on file. ? ? ?[name] is a 76 y.o. male that is presenting with acute on chronic right knee pain.  The pain is occurring over the medial joint space.  No recent injury.  He does have a distant history of a meniscectomy from years ago.  Has been using ibuprofen as well as topical ointments. ? ? ?Review of Systems ?See HPI  ? ?HISTORY: Past Medical, Surgical, Social, and Family History Reviewed & Updated per EMR.   ?Pertinent Historical Findings include: ? ?Past Medical History:  ?Diagnosis Date  ? Abnormal CT scan, gastrointestinal tract suspect angioedema   ? Anemia 03/16/2016  ? IRON  ? Anxiety   ? Arthritis   ? Back pain 10/10/2012  ? Fatty liver 2005  ? seen on 2005 ultrasound,05/2015 CT.   ? Hemorrhoids 08/22/2016  ? Hemorrhoids, internal, with bleeding 2005  ? internal and external. 2005 band ligation (dr Ferdinand Lango in [facility])  ? Hyperglycemia 08/21/2013  ? Hyperlipidemia   ? Hypertension   ? Nocturia 08/21/2013  ? Obesity (BMI 30.0-34.9)   ? Osteoporosis   ? Pneumonia   ? as a child  ? Rectal bleeding 07/30/2014  ? Renal cyst   ? SBO (small bowel obstruction) (HCC)   ? Tubular adenoma of colon before 2005, 2012, 08/2014  ? Unspecified constipation 02/06/2014  ? Vitamin D deficiency   ? ? ?Past Surgical History:  ?Procedure Laterality Date  ? COLONOSCOPY  before 2005, 2012, 2013, 08/2014.   ? ENTEROSCOPY N/A 10/05/2017  ? Procedure: ENTEROSCOPY;  Surgeon: Gatha Mayer, MD;  Location: [facility];  Service: Endoscopy;  Laterality: N/A;  ? FRACTURE SURGERY Left 1989  ? leg  ? HEMORRHOID BANDING  2005  ? KNEE SURGERY Left 2010  ? arthroscopy, torn carilage  ? KNEE SURGERY Right 2012  ? arthroscopy  ? TONSILLECTOMY  1957  ? TONSILLECTOMY    ? TOTAL SHOULDER ARTHROPLASTY Left 11/01/2020  ? Procedure: LEFT ANATOMIC SHOULDER REPLACEMENT;  Surgeon: Meredith Pel, MD;  Location: [place];   Service: Orthopedics;  Laterality: Left;  ? VENTRAL HERNIA REPAIR N/A 06/03/2018  ? Procedure: LAPAROSCOPIC VENTRAL WALL  HERNIA  REPAIR WITH MESH  ERAS PATHWAY;  Surgeon: Michael Boston, MD;  Location: [facility];  Service: General;  Laterality: N/A;  ? ? ? ?PHYSICAL EXAM:  ?VS: BP 138/80   Ht '5\' 10"'$  (1.778 m)   Wt 216 lb (98 kg)   BMI 30.99 kg/m?  ?Physical Exam ?Gen: NAD, alert, cooperative with exam, well-appearing ?MSK:  ?Neurovascularly intact   ? ? ?Aspiration/Injection Procedure Note ?Ihor Dow ?25-Dec-1945 ? ?Procedure: Injection ?Indications: Right knee pain ? ?Procedure Details ?Consent: Risks of procedure as well as the alternatives and risks of each were explained to the (patient/caregiver).  Consent for procedure obtained. ?Time Out: Verified patient identification, verified procedure, site/side was marked, verified correct patient position, special equipment/implants available, medications/allergies/relevent history reviewed, required imaging and test results available.  Performed.  The area was cleaned with iodine and alcohol swabs.   ? ?The right knee superior lateral suprapatellar pouch was injected using 1 cc's of 30 mg Toradol and 4 cc's of 0.25% bupivacaine with a 25 1 1/2" needle.  Ultrasound was used. Images were obtained in long views showing the injection.   ? ? ?A sterile dressing was applied. ? ?Patient did tolerate procedure well. ? ? ? ? ?  ASSESSMENT & PLAN:  ? ?Primary osteoarthritis of right knee ?Acute on chronic in nature.  Has tried oral medications as well as previous injections. ?-Counseled on home exercise therapy and supportive care. ?-Toradol intra-articular injection today. ?-Pursue Zilretta injection. ?-X-ray. ? ? ? ? ?

## 2021-09-23 ENCOUNTER — Telehealth: Payer: Self-pay | Admitting: Family Medicine

## 2021-09-23 NOTE — Telephone Encounter (Signed)
Informed of results.  ? ?Rosemarie Ax, MD ?[facility] ?09/23/2021, 5:18 PM ? ?

## 2021-09-30 ENCOUNTER — Ambulatory Visit: Payer: Self-pay

## 2021-09-30 ENCOUNTER — Ambulatory Visit (INDEPENDENT_AMBULATORY_CARE_PROVIDER_SITE_OTHER): Payer: Medicare Other | Admitting: Family Medicine

## 2021-09-30 VITALS — BP 128/80 | Ht 70.0 in | Wt 216.0 lb

## 2021-09-30 DIAGNOSIS — M1711 Unilateral primary osteoarthritis, right knee: Secondary | ICD-10-CM

## 2021-09-30 NOTE — Progress Notes (Signed)
?  [name] - 76 y.o. male MRN [id number]  Date of birth: 1946-01-07 ? ?SUBJECTIVE:  Including CC & ROS.  ?No chief complaint on file. ? ? ?[name] is a 76 y.o. male that is  here for zilretta injection. ? ? ? ?Review of Systems ?See HPI  ? ?HISTORY: Past Medical, Surgical, Social, and Family History Reviewed & Updated per EMR.   ?Pertinent Historical Findings include: ? ?Past Medical History:  ?Diagnosis Date  ? Abnormal CT scan, gastrointestinal tract suspect angioedema   ? Anemia 03/16/2016  ? IRON  ? Anxiety   ? Arthritis   ? Back pain 10/10/2012  ? Fatty liver 2005  ? seen on 2005 ultrasound,05/2015 CT.   ? Hemorrhoids 08/22/2016  ? Hemorrhoids, internal, with bleeding 2005  ? internal and external. 2005 band ligation (dr Ferdinand Lango in [facility])  ? Hyperglycemia 08/21/2013  ? Hyperlipidemia   ? Hypertension   ? Nocturia 08/21/2013  ? Obesity (BMI 30.0-34.9)   ? Osteoporosis   ? Pneumonia   ? as a child  ? Rectal bleeding 07/30/2014  ? Renal cyst   ? SBO (small bowel obstruction) (HCC)   ? Tubular adenoma of colon before 2005, 2012, 08/2014  ? Unspecified constipation 02/06/2014  ? Vitamin D deficiency   ? ? ?Past Surgical History:  ?Procedure Laterality Date  ? COLONOSCOPY  before 2005, 2012, 2013, 08/2014.   ? ENTEROSCOPY N/A 10/05/2017  ? Procedure: ENTEROSCOPY;  Surgeon: Gatha Mayer, MD;  Location: [facility];  Service: Endoscopy;  Laterality: N/A;  ? FRACTURE SURGERY Left 1989  ? leg  ? HEMORRHOID BANDING  2005  ? KNEE SURGERY Left 2010  ? arthroscopy, torn carilage  ? KNEE SURGERY Right 2012  ? arthroscopy  ? TONSILLECTOMY  1957  ? TONSILLECTOMY    ? TOTAL SHOULDER ARTHROPLASTY Left 11/01/2020  ? Procedure: LEFT ANATOMIC SHOULDER REPLACEMENT;  Surgeon: Meredith Pel, MD;  Location: [place];  Service: Orthopedics;  Laterality: Left;  ? VENTRAL HERNIA REPAIR N/A 06/03/2018  ? Procedure: LAPAROSCOPIC VENTRAL WALL  HERNIA  REPAIR WITH MESH  ERAS PATHWAY;  Surgeon: Michael Boston, MD;  [facility];   Service: General;  Laterality: N/A;  ? ? ? ?PHYSICAL EXAM:  ?VS: BP 128/80   Ht '5\' 10"'$  (1.778 m)   Wt 216 lb (98 kg)   BMI 30.99 kg/m?  ?Physical Exam ?Gen: NAD, alert, cooperative with exam, well-appearing ?MSK:  ?Neurovascularly intact   ? ? ?Aspiration/Injection Procedure Note ?Ihor Dow ?August 16, 1945 ? ?Procedure: Injection ?Indications: right knee pain ? ?Procedure Details ?Consent: Risks of procedure as well as the alternatives and risks of each were explained to the (patient/caregiver).  Consent for procedure obtained. ?Time Out: Verified patient identification, verified procedure, site/side was marked, verified correct patient position, special equipment/implants available, medications/allergies/relevent history reviewed, required imaging and test results available.  Performed.  The area was cleaned with iodine and alcohol swabs.   ? ?The right knee superior lateral suprapatellar pouch was injected using 3 cc of 1% lidocaine on a 25-gauge 1-1/2 inch needle.  The syringe was switched and a mixture containing 5 cc's of 32 mg Zilretta and 4 cc's of 0.25% bupivacaine was injected.  Ultrasound was used. Images were obtained in long views showing the injection.  ? ? ?A sterile dressing was applied. ? ?Patient did tolerate procedure well. ? ? ? ?ASSESSMENT & PLAN:  ? ?Primary osteoarthritis of right knee ?Completed zilretta injection.  ? ? ? ? ?

## 2021-09-30 NOTE — Patient Instructions (Signed)
Good to see you Please use ice as needed   Please send me a message in MyChart with any questions or updates.  Please see me back in 4-6 weeks.   --Dr. Vernessa Likes  

## 2021-09-30 NOTE — Assessment & Plan Note (Signed)
Completed zilretta injection.  ?

## 2021-10-18 ENCOUNTER — Ambulatory Visit (INDEPENDENT_AMBULATORY_CARE_PROVIDER_SITE_OTHER): Payer: Medicare Other | Admitting: Orthopedic Surgery

## 2021-10-18 DIAGNOSIS — Z96612 Presence of left artificial shoulder joint: Secondary | ICD-10-CM

## 2021-10-19 ENCOUNTER — Encounter: Payer: Self-pay | Admitting: Orthopedic Surgery

## 2021-10-19 NOTE — Progress Notes (Signed)
? ?Office Visit Note ?  ?Patient: [name]           ?Date of Birth: 04-11-1946           ?MRN: [id number] ?Visit Date: 10/18/2021 ?Requested by: Mosie Lukes, MD ?[address] ?[address] ?[place],  [place] [address] ?PCP: Mosie Lukes, MD ? ?Subjective: ?Chief Complaint  ?Patient presents with  ? Left Shoulder - Follow-up  ?   11/01/20 (54m16d) Left Anatomic Shoulder Replacement - Left ? ?  ? ? ?HPI: HZaydis a 76year old patient who is now about a year out left shoulder replacement.  He is doing well.  He plays golf.  He is back at the gym.  Right shoulder also is giving him some early pain but not as severe as his left shoulder was prior to surgery.  He may come back to get that looked at.  Does have a history of injections in the right shoulder.  That pain comes and goes. ?             ?ROS: All systems reviewed are negative as they relate to the chief complaint within the history of present illness.  Patient denies  fevers or chills. ? ? ?Assessment & Plan: ?Visit Diagnoses:  ?1. S/P shoulder replacement, left   ? ? ?Plan: Impression is patient is doing well with his left shoulder replacement.  Plan at this time is to be careful with his recreational and weightlifting activities.  Subscap strength is excellent.  He may consider injection at some point in the future in the right shoulder but on cursory exam today he is got good range of motion and strength without too much coarse grinding indicating arthritis present.  Follow-up as needed. ? ?Follow-Up Instructions: No follow-ups on file.  ? ?Orders:  ?No orders of the defined types were placed in this encounter. ? ?No orders of the defined types were placed in this encounter. ? ? ? ? Procedures: ?No procedures performed ? ? ?Clinical Data: ?No additional findings. ? ?Objective: ?Vital Signs: There were no vitals taken for this visit. ? ?Physical Exam:  ? ?Constitutional: Patient appears well-developed ?HEENT:  ?Head: Normocephalic ?Eyes:EOM are  normal ?Neck: Normal range of motion ?Cardiovascular: Normal rate ?Pulmonary/chest: Effort normal ?Neurologic: Patient is alert ?Skin: Skin is warm ?Psychiatric: Patient has normal mood and affect ? ? ?Ortho Exam: Ortho exam demonstrates left shoulder range of motion of 45/95/150.  Deltoid is functional.  He has excellent subscap strength as well as external rotation strength.  Right shoulder is examined and that range of motion is 50/100/160.  Good rotator cuff strength.  Infraspinatus supraspinatus and subscap muscle testing with no coarse grinding or crepitus. ? ?Specialty Comments:  ?No specialty comments available. ? ?Imaging: ?No results found. ? ? ?PMFS History: ?Patient Active Problem List  ? Diagnosis Date Noted  ? Shoulder arthritis   ? 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 Skin tags, multiple acquired 07/07/2019  ? LUQ pain 07/07/2019  ? Primary osteoarthritis of right knee 03/16/2019  ? Ventral hernia s/p lap repair with mesh 06/03/2018 06/03/2018  ? Hemorrhoids 08/22/2016  ? Anemia 03/16/2016  ? Right shoulder pain 02/26/2016  ? Abnormal CT scan, gastrointestinal tract suspect angioedema   ? Abdominal pain   ? Hyperglycemia 08/21/2013  ? Low back pain 10/10/2012  ? Hyperlipidemia   ? Hypertension, essential    ? Obesity (BMI 30.0-34.9)   ? ?Past Medical History:  ?Diagnosis Date  ?  Abnormal CT scan, gastrointestinal tract suspect angioedema   ? Anemia 03/16/2016  ? IRON  ? Anxiety   ? Arthritis   ? Back pain 10/10/2012  ? Fatty liver 2005  ? seen on 2005 ultrasound,05/2015 CT.   ? Hemorrhoids 08/22/2016  ? Hemorrhoids, internal, with bleeding 2005  ? internal and external. 2005 band ligation (dr Ferdinand Lango in [facility])  ? Hyperglycemia 08/21/2013  ? Hyperlipidemia   ? Hypertension   ? Nocturia 08/21/2013  ? Obesity (BMI 30.0-34.9)   ? Osteoporosis   ? Pneumonia   ? as a child  ? Rectal bleeding 07/30/2014  ? Renal cyst   ? SBO (small bowel obstruction) (HCC)   ? Tubular adenoma of colon before 2005, 2012,  08/2014  ? Unspecified constipation 02/06/2014  ? Vitamin D deficiency   ?  ?Family History  ?Problem Relation Age of Onset  ? Stroke Mother   ? Aneurysm Mother   ?     brain  ? Arthritis Father   ? Liver cancer Father   ?     liver, atomic testing in [employer]  ? Gallbladder disease Father   ? Hyperlipidemia Sister   ? Hypertension Sister   ? Obesity Sister   ? Diabetes Sister   ? Diabetes Brother   ? Hyperlipidemia Sister   ? Hypertension Sister   ? Obesity Sister   ? Diabetes Sister   ? Aneurysm Brother   ?     brain  ? COPD Brother   ? Gallbladder disease Sister   ?     x4  ? Colon cancer Neg Hx   ? Colon polyps Neg Hx   ? Esophageal cancer Neg Hx   ? Rectal cancer Neg Hx   ? Stomach cancer Neg Hx   ? Prostate cancer Neg Hx   ? CAD Neg Hx   ?  ?Past Surgical History:  ?Procedure Laterality Date  ? COLONOSCOPY  before 2005, 2012, 2013, 08/2014.   ? ENTEROSCOPY N/A 10/05/2017  ? Procedure: ENTEROSCOPY;  Surgeon: Gatha Mayer, MD;  Location: [facility];  Service: Endoscopy;  Laterality: N/A;  ? FRACTURE SURGERY Left 1989  ? leg  ? HEMORRHOID BANDING  2005  ? KNEE SURGERY Left 2010  ? arthroscopy, torn carilage  ? KNEE SURGERY Right 2012  ? arthroscopy  ? TONSILLECTOMY  1957  ? TONSILLECTOMY    ? TOTAL SHOULDER ARTHROPLASTY Left 11/01/2020  ? Procedure: LEFT ANATOMIC SHOULDER REPLACEMENT;  Surgeon: Meredith Pel, MD;  Location: [place];  Service: Orthopedics;  Laterality: Left;  ? VENTRAL HERNIA REPAIR N/A 06/03/2018  ? Procedure: LAPAROSCOPIC VENTRAL WALL  HERNIA  REPAIR WITH MESH  ERAS PATHWAY;  Surgeon: Michael Boston, MD;  [facility];  Service: General;  Laterality: N/A;  ? ?Social History  ? ?Occupational History  ? Occupation: Retired  ?  Employer: [employer]  ?Tobacco Use  ? Smoking status: Never  ? Smokeless tobacco: Never  ?Vaping Use  ? Vaping Use: Never used  ?Substance and Sexual Activity  ? Alcohol use: Yes  ?  Alcohol/week: 2.0 standard drinks  ?  Types: 2 Standard drinks or equivalent  per week  ?  Comment: Occassionally  ? Drug use: No  ? Sexual activity: Yes  ? ? ? ? ? ?

## 2021-10-30 ENCOUNTER — Other Ambulatory Visit: Payer: Self-pay | Admitting: Family Medicine

## 2021-10-30 ENCOUNTER — Ambulatory Visit (INDEPENDENT_AMBULATORY_CARE_PROVIDER_SITE_OTHER): Payer: Medicare Other | Admitting: Family Medicine

## 2021-10-30 ENCOUNTER — Ambulatory Visit (HOSPITAL_BASED_OUTPATIENT_CLINIC_OR_DEPARTMENT_OTHER)
Admission: RE | Admit: 2021-10-30 | Discharge: 2021-10-30 | Disposition: A | Discharge status: Home / Self Care | Payer: Medicare Other | Source: Ambulatory Visit | Attending: Family Medicine | Admitting: Family Medicine

## 2021-10-30 VITALS — BP 140/80 | Ht 70.0 in | Wt 215.0 lb

## 2021-10-30 DIAGNOSIS — M1711 Unilateral primary osteoarthritis, right knee: Secondary | ICD-10-CM

## 2021-10-30 DIAGNOSIS — Z96612 Presence of left artificial shoulder joint: Secondary | ICD-10-CM | POA: Diagnosis not present

## 2021-10-30 DIAGNOSIS — M170 Bilateral primary osteoarthritis of knee: Secondary | ICD-10-CM | POA: Insufficient documentation

## 2021-10-30 NOTE — Assessment & Plan Note (Signed)
Having mild pain following shoulder replacement surgery. ?-Counseled on home exercise therapy and supportive care. ?-Pursues shockwave therapy. ?

## 2021-10-30 NOTE — Patient Instructions (Addendum)
Good to see you ? ?We'll send you for the nerve ablation Kennith Gain) for the right knee Dr Pascal Lux will be calling you on May 15th at 11am for your consultation.  ? ?We'll order zilretta for the left knee  ?I'll call with the left knee xray results ?Please send me a message in MyChart with any questions or updates.  ?Please see me back to perform shockwave for the left shoulder.  ? ?--Dr. Raeford Razor ? ?

## 2021-10-30 NOTE — Assessment & Plan Note (Signed)
Significant improvement of the right knee after the Zilretta injection.  Left knee is presenting with acute on chronic pain.  Has had problems in the past. ?-Counseled on home exercise therapy and supportive care. ?-Referral for iovera in the right knee. ?-Pursue left knee Zilretta. ?-X-ray of left knee. ?

## 2021-10-30 NOTE — Progress Notes (Signed)
?  [name] - 76 y.o. male MRN [id number]  Date of birth: [birth year] ? ?SUBJECTIVE:  Including CC & ROS.  ?No chief complaint on file. ? ? ?[name] is a 76 y.o. male that is following up for his right knee pain.  He is also presenting with acute on chronic left knee pain.  Also has left shoulder pain following his shoulder replacement. ? ? ? ?Review of Systems ?See HPI  ? ?HISTORY: Past Medical, Surgical, Social, and Family History Reviewed & Updated per EMR.   ?Pertinent Historical Findings include: ? ?Past Medical History:  ?Diagnosis Date  ? Abnormal CT scan, gastrointestinal tract suspect angioedema   ? Anemia 03/16/2016  ? IRON  ? Anxiety   ? Arthritis   ? Back pain 10/10/2012  ? Fatty liver 2005  ? seen on 2005 ultrasound,05/2015 CT.   ? Hemorrhoids 08/22/2016  ? Hemorrhoids, internal, with bleeding 2005  ? internal and external. 2005 band ligation (dr Ferdinand Lango in [facility])  ? Hyperglycemia 08/21/2013  ? Hyperlipidemia   ? Hypertension   ? Nocturia 08/21/2013  ? Obesity (BMI 30.0-34.9)   ? Osteoporosis   ? Pneumonia   ? as a child  ? Rectal bleeding 07/30/2014  ? Renal cyst   ? SBO (small bowel obstruction) (HCC)   ? Tubular adenoma of colon before 2005, 2012, 08/2014  ? Unspecified constipation 02/06/2014  ? Vitamin D deficiency   ? ? ?Past Surgical History:  ?Procedure Laterality Date  ? COLONOSCOPY  before 2005, 2012, 2013, 08/2014.   ? ENTEROSCOPY N/A 10/05/2017  ? Procedure: ENTEROSCOPY;  Surgeon: Gatha Mayer, MD;  Location: [facility];  Service: Endoscopy;  Laterality: N/A;  ? FRACTURE SURGERY Left 1989  ? leg  ? HEMORRHOID BANDING  2005  ? KNEE SURGERY Left 2010  ? arthroscopy, torn carilage  ? KNEE SURGERY Right 2012  ? arthroscopy  ? TONSILLECTOMY  1957  ? TONSILLECTOMY    ? TOTAL SHOULDER ARTHROPLASTY Left 11/01/2020  ? Procedure: LEFT ANATOMIC SHOULDER REPLACEMENT;  Surgeon: Meredith Pel, MD;  Location: [place];  Service: Orthopedics;  Laterality: Left;  ? VENTRAL HERNIA REPAIR N/A 06/03/2018   ? Procedure: LAPAROSCOPIC VENTRAL WALL  HERNIA  REPAIR WITH MESH  ERAS PATHWAY;  Surgeon: Michael Boston, MD;  [facility];  Service: General;  Laterality: N/A;  ? ? ? ?PHYSICAL EXAM:  ?VS: BP 140/80   Ht '5\' 10"'$  (1.778 m)   Wt 215 lb (97.5 kg)   BMI 30.85 kg/m?  ?Physical Exam ?Gen: NAD, alert, cooperative with exam, well-appearing ?MSK:  ?Neurovascularly intact   ? ? ? ? ?ASSESSMENT & PLAN:  ? ?OA (osteoarthritis) of knee ?Significant improvement of the right knee after the Zilretta injection.  Left knee is presenting with acute on chronic pain.  Has had problems in the past. ?-Counseled on home exercise therapy and supportive care. ?-Referral for iovera in the right knee. ?-Pursue left knee Zilretta. ?-X-ray of left knee. ? ?S/P shoulder replacement, left ?Having mild pain following shoulder replacement surgery. ?-Counseled on home exercise therapy and supportive care. ?-Pursues shockwave therapy. ? ? ? ? ?

## 2021-11-01 ENCOUNTER — Telehealth: Payer: Self-pay | Admitting: Family Medicine

## 2021-11-01 NOTE — Telephone Encounter (Signed)
--  Forwarding message to med asst to call patient back with X-ray results. ?--glh ?

## 2021-11-01 NOTE — Telephone Encounter (Signed)
Left VM for patient. If he calls back please have him speak with a nurse/CMA and inform that his xray is showing arthritis in the inside portion of his knee. We'll call him when the zilretta is in.  ? ?If any questions then please take the best time and phone number to call and I will try to call him back.  ? ?Rosemarie Ax, MD ?[facility] ?11/01/2021, 8:50 AM ? ? ?

## 2021-11-04 ENCOUNTER — Telehealth: Payer: Self-pay | Admitting: *Deleted

## 2021-11-04 NOTE — Telephone Encounter (Signed)
Received fax from [facility] stating Zilretta knee injection is covered by patient's Part B plan. No OOP information was provided.  ?

## 2021-11-04 NOTE — Telephone Encounter (Signed)
Pt informed of below.  

## 2021-11-05 ENCOUNTER — Ambulatory Visit (INDEPENDENT_AMBULATORY_CARE_PROVIDER_SITE_OTHER): Payer: Medicare Other | Admitting: Family Medicine

## 2021-11-05 ENCOUNTER — Ambulatory Visit: Payer: Self-pay

## 2021-11-05 ENCOUNTER — Encounter: Payer: Self-pay | Admitting: Family Medicine

## 2021-11-05 VITALS — BP 120/84 | Ht 70.0 in | Wt 215.0 lb

## 2021-11-05 DIAGNOSIS — M1712 Unilateral primary osteoarthritis, left knee: Secondary | ICD-10-CM

## 2021-11-05 NOTE — Progress Notes (Signed)
?[name] - 76 y.o. male MRN [id number]  Date of birth: [birth year] ? ?SUBJECTIVE:  Including CC & ROS.  ?No chief complaint on file. ? ? ?[name] is a 76 y.o. male that is here for Zilretta injection. ? ? ?Review of Systems ?See HPI  ? ?HISTORY: Past Medical, Surgical, Social, and Family History Reviewed & Updated per EMR.   ?Pertinent Historical Findings include: ? ?Past Medical History:  ?Diagnosis Date  ? Abnormal CT scan, gastrointestinal tract suspect angioedema   ? Anemia 03/16/2016  ? IRON  ? Anxiety   ? Arthritis   ? Back pain 10/10/2012  ? Fatty liver 2005  ? seen on 2005 ultrasound,05/2015 CT.   ? Hemorrhoids 08/22/2016  ? Hemorrhoids, internal, with bleeding 2005  ? internal and external. 2005 band ligation (dr Ferdinand Lango in [facility])  ? Hyperglycemia 08/21/2013  ? Hyperlipidemia   ? Hypertension   ? Nocturia 08/21/2013  ? Obesity (BMI 30.0-34.9)   ? Osteoporosis   ? Pneumonia   ? as a child  ? Rectal bleeding 07/30/2014  ? Renal cyst   ? SBO (small bowel obstruction) (HCC)   ? Tubular adenoma of colon before 2005, 2012, 08/2014  ? Unspecified constipation 02/06/2014  ? Vitamin D deficiency   ? ? ?Past Surgical History:  ?Procedure Laterality Date  ? COLONOSCOPY  before 2005, 2012, 2013, 08/2014.   ? ENTEROSCOPY N/A 10/05/2017  ? Procedure: ENTEROSCOPY;  Surgeon: Gatha Mayer, MD;  Location: [facility];  Service: Endoscopy;  Laterality: N/A;  ? FRACTURE SURGERY Left 1989  ? leg  ? HEMORRHOID BANDING  2005  ? KNEE SURGERY Left 2010  ? arthroscopy, torn carilage  ? KNEE SURGERY Right 2012  ? arthroscopy  ? TONSILLECTOMY  1957  ? TONSILLECTOMY    ? TOTAL SHOULDER ARTHROPLASTY Left 11/01/2020  ? Procedure: LEFT ANATOMIC SHOULDER REPLACEMENT;  Surgeon: Meredith Pel, MD;  Location: [place];  Service: Orthopedics;  Laterality: Left;  ? VENTRAL HERNIA REPAIR N/A 06/03/2018  ? Procedure: LAPAROSCOPIC VENTRAL WALL  HERNIA  REPAIR WITH MESH  ERAS PATHWAY;  Surgeon: Michael Boston, MD;  [facility];  Service:  General;  Laterality: N/A;  ? ? ? ?PHYSICAL EXAM:  ?VS: BP 120/84 (BP Location: Left Arm, Patient Position: Sitting)   Ht '5\' 10"'$  (1.778 m)   Wt 215 lb (97.5 kg)   BMI 30.85 kg/m?  ?Physical Exam ?Gen: NAD, alert, cooperative with exam, well-appearing ?MSK:  ?Neurovascularly intact   ? ? ?Aspiration/Injection Procedure Note ?[name] ?[birth year] ? ?Procedure: Injection ?Indications: Left knee pain ? ?Procedure Details ?Consent: Risks of procedure as well as the alternatives and risks of each were explained to the (patient/caregiver).  Consent for procedure obtained. ?Time Out: Verified patient identification, verified procedure, site/side was marked, verified correct patient position, special equipment/implants available, medications/allergies/relevent history reviewed, required imaging and test results available.  Performed.  The area was cleaned with iodine and alcohol swabs.   ? ?The left knee superior lateral suprapatellar pouch was injected using 3 cc of 1% lidocaine on a 25-gauge 1-1/2 inch needle.   The syringe was switched and a mixture containing 5 cc's of 32 mg Zilretta and 4 cc's of 0.25% bupivacaine was injected.  Ultrasound was used. Images were obtained in long views showing the injection.  ? ? ?A sterile dressing was applied. ? ?Patient did tolerate procedure well. ? ?  ? ? ? ?ASSESSMENT & PLAN:  ? ?OA (osteoarthritis) of knee ?Completed  Zilretta injection ? ? ? ? ?

## 2021-11-05 NOTE — Patient Instructions (Signed)
Good to see you Please use ice as needed   Please send me a message in MyChart with any questions or updates.  Please see me back for shockwave therapy.   --Dr. Capers Hagmann  

## 2021-11-05 NOTE — Assessment & Plan Note (Signed)
Completed Zilretta injection 

## 2021-11-08 DIAGNOSIS — H2513 Age-related nuclear cataract, bilateral: Secondary | ICD-10-CM | POA: Diagnosis not present

## 2021-11-08 DIAGNOSIS — H25043 Posterior subcapsular polar age-related cataract, bilateral: Secondary | ICD-10-CM | POA: Diagnosis not present

## 2021-11-08 DIAGNOSIS — H2512 Age-related nuclear cataract, left eye: Secondary | ICD-10-CM | POA: Diagnosis not present

## 2021-11-08 DIAGNOSIS — H25013 Cortical age-related cataract, bilateral: Secondary | ICD-10-CM | POA: Diagnosis not present

## 2021-11-08 DIAGNOSIS — H18413 Arcus senilis, bilateral: Secondary | ICD-10-CM | POA: Diagnosis not present

## 2021-11-11 ENCOUNTER — Ambulatory Visit (INDEPENDENT_AMBULATORY_CARE_PROVIDER_SITE_OTHER): Payer: Self-pay | Admitting: Family Medicine

## 2021-11-11 ENCOUNTER — Encounter: Payer: Self-pay | Admitting: Family Medicine

## 2021-11-11 ENCOUNTER — Ambulatory Visit
Admission: RE | Admit: 2021-11-11 | Discharge: 2021-11-11 | Disposition: A | Discharge status: Home / Self Care | Payer: Self-pay | Source: Ambulatory Visit | Attending: Family Medicine | Admitting: Family Medicine

## 2021-11-11 DIAGNOSIS — M25561 Pain in right knee: Secondary | ICD-10-CM | POA: Diagnosis not present

## 2021-11-11 DIAGNOSIS — M1711 Unilateral primary osteoarthritis, right knee: Secondary | ICD-10-CM

## 2021-11-11 DIAGNOSIS — Z96612 Presence of left artificial shoulder joint: Secondary | ICD-10-CM

## 2021-11-11 DIAGNOSIS — M25562 Pain in left knee: Secondary | ICD-10-CM | POA: Diagnosis not present

## 2021-11-11 HISTORY — PX: IR RADIOLOGIST EVAL & MGMT: IMG5224

## 2021-11-11 NOTE — Consult Note (Signed)
? ? ?Chief Complaint: ?Bilateral knee pain ? ?Referring Physician(s): ?Schmitz,Jeremy E ? ?History of Present Illness: ?[name] is a 76 y.o. male with past medical history significant for hypertension, hyperlipidemia and obesity who has been referred for consideration of iovera therapy for bilateral knee pain. ? ?Patient has undergone bilateral steroid injections with the right knee injected approximately 4 weeks ago and the left knee injected approximately 1 week ago. ? ?These injections have resulted in significant improvement in the patient's preprocedural pain and are associated with increased functional mobility.  In particular, patient states he has been able to return to golf. ? ?Patient reports that his knee pain is attributable to advanced degenerative change however presently he states he has not been offered a surgery or a knee replacement. ?   ? ? ?Past Medical History:  ?Diagnosis Date  ? Abnormal CT scan, gastrointestinal tract suspect angioedema   ? Anemia 03/16/2016  ? IRON  ? Anxiety   ? Arthritis   ? Back pain 10/10/2012  ? Fatty liver 2005  ? seen on 2005 ultrasound,05/2015 CT.   ? Hemorrhoids 08/22/2016  ? Hemorrhoids, internal, with bleeding 2005  ? internal and external. 2005 band ligation (dr Ferdinand Lango in [facility])  ? Hyperglycemia 08/21/2013  ? Hyperlipidemia   ? Hypertension   ? Nocturia 08/21/2013  ? Obesity (BMI 30.0-34.9)   ? Osteoporosis   ? Pneumonia   ? as a child  ? Rectal bleeding 07/30/2014  ? Renal cyst   ? SBO (small bowel obstruction) (HCC)   ? Tubular adenoma of colon before 2005, 2012, 08/2014  ? Unspecified constipation 02/06/2014  ? Vitamin D deficiency   ? ? ?Past Surgical History:  ?Procedure Laterality Date  ? COLONOSCOPY  before 2005, 2012, 2013, 08/2014.   ? ENTEROSCOPY N/A 10/05/2017  ? Procedure: ENTEROSCOPY;  Surgeon: Gatha Mayer, MD;  Location: [facility];  Service: Endoscopy;  Laterality: N/A;  ? FRACTURE SURGERY Left 1989  ? leg  ? HEMORRHOID BANDING  2005  ?  KNEE SURGERY Left 2010  ? arthroscopy, torn carilage  ? KNEE SURGERY Right 2012  ? arthroscopy  ? TONSILLECTOMY  1957  ? TONSILLECTOMY    ? TOTAL SHOULDER ARTHROPLASTY Left 11/01/2020  ? Procedure: LEFT ANATOMIC SHOULDER REPLACEMENT;  Surgeon: Meredith Pel, MD;  Location: [place];  Service: Orthopedics;  Laterality: Left;  ? VENTRAL HERNIA REPAIR N/A 06/03/2018  ? Procedure: LAPAROSCOPIC VENTRAL WALL  HERNIA  REPAIR WITH MESH  ERAS PATHWAY;  Surgeon: Michael Boston, MD;  [facility];  Service: General;  Laterality: N/A;  ? ? ?Allergies: ?Patient has no known allergies. ? ?Medications: ?Prior to Admission medications   ?Medication Sig Start Date End Date Taking? Authorizing Provider  ?acetaminophen (TYLENOL) 325 MG tablet Take 1-2 tablets (325-650 mg total) by mouth every 6 (six) hours as needed for mild pain (pain score 1-3 or temp > 100.5). 11/02/20   Meredith Pel, MD  ?ALPRAZolam Duanne Moron) 1 MG tablet TAKE 1 TABLET (1 MG TOTAL) BY MOUTH AT BEDTIME AS NEEDED FOR ANXIETY. 10/26/20   Colon Branch, MD  ?amLODipine (NORVASC) 5 MG tablet TAKE 1 TABLET DAILY 08/19/21   Mosie Lukes, MD  ?hydrocortisone (ANUSOL-HC) 25 MG suppository Unwrap and place 1 suppository (25 mg total) rectally 2 (two) times daily. 02/06/21   Saguier, Percell Miller, PA-C  ?hyoscyamine (ANASPAZ) 0.125 MG TBDP disintergrating tablet Place 1 tablet (0.125 mg total) under the tongue every 4 (four) hours as needed for cramping.  01/21/21   Mosie Lukes, MD  ?LIVALO 2 MG TABS TAKE 1 TABLET DAILY 09/05/21   Mosie Lukes, MD  ?losartan (COZAAR) 50 MG tablet TAKE 1 TABLET DAILY 12/24/20   Mosie Lukes, MD  ?metoprolol succinate (TOPROL-XL) 50 MG 24 hr tablet TAKE 1 TABLET DAILY 12/24/20   Mosie Lukes, MD  ?Multiple Vitamins-Minerals (MULTIVITAMIN ADULT PO) Take 2 tablets by mouth daily.    [provider]  ?sildenafil (REVATIO) 20 MG tablet TAKE 1 TO 5 TABLETS BY MOUTH AS NEEDED 1 HOUR PRIOR TO INTERCOURSE 01/21/21 01/21/22    ?tamsulosin  (FLOMAX) 0.4 MG CAPS capsule Take 0.4 mg by mouth daily. 03/26/21   [provider]  ?trolamine salicylate (ASPERCREME) 10 % cream Apply 1 application topically as needed for muscle pain.    [provider]  ?  ? ?Family History  ?Problem Relation Age of Onset  ? Stroke Mother   ? Aneurysm Mother   ?     brain  ? Arthritis Father   ? Liver cancer Father   ?     liver, atomic testing in [employer]  ? Gallbladder disease Father   ? Hyperlipidemia Sister   ? Hypertension Sister   ? Obesity Sister   ? Diabetes Sister   ? Diabetes Brother   ? Hyperlipidemia Sister   ? Hypertension Sister   ? Obesity Sister   ? Diabetes Sister   ? Aneurysm Brother   ?     brain  ? COPD Brother   ? Gallbladder disease Sister   ?     x4  ? Colon cancer Neg Hx   ? Colon polyps Neg Hx   ? Esophageal cancer Neg Hx   ? Rectal cancer Neg Hx   ? Stomach cancer Neg Hx   ? Prostate cancer Neg Hx   ? CAD Neg Hx   ? ? ?Social History  ? ?Socioeconomic History  ? Marital status: Married  ?  Spouse name: Not on file  ? Number of children: 1  ? Years of education: Not on file  ? Highest education level: Not on file  ?Occupational History  ? Occupation: Retired  ?  Employer: [employer]  ?Tobacco Use  ? Smoking status: Never  ? Smokeless tobacco: Never  ?Vaping Use  ? Vaping Use: Never used  ?Substance and Sexual Activity  ? Alcohol use: Yes  ?  Alcohol/week: 2.0 standard drinks  ?  Types: 2 Standard drinks or equivalent per week  ?  Comment: Occassionally  ? Drug use: No  ? Sexual activity: Yes  ?Other Topics Concern  ? Not on file  ?Social History Narrative  ? Married - 1 child  ? Retired from [employer]  ? Some EtOH, no tbacco/drug use  ? ?Social Determinants of Health  ? ?Financial Resource Strain: Low Risk   ? Difficulty of Paying Living Expenses: Not hard at all  ?Food Insecurity: No Food Insecurity  ? Worried About Charity fundraiser in the Last Year: Never true  ? Ran Out of Food in the Last Year: Never true  ?Transportation  Needs: No Transportation Needs  ? Lack of Transportation (Medical): No  ? Lack of Transportation (Non-Medical): No  ?Physical Activity: Sufficiently Active  ? Days of Exercise per Week: 4 days  ? Minutes of Exercise per Session: 40 min  ?Stress: No Stress Concern Present  ? Feeling of Stress : Only a little  ?Social Connections: Socially Integrated  ?  Frequency of Communication with Friends and Family: More than three times a week  ? Frequency of Social Gatherings with Friends and Family: More than three times a week  ? Attends Religious Services: More than 4 times per year  ? Active Member of Clubs or Organizations: Yes  ? Attends Archivist Meetings: More than 4 times per year  ? Marital Status: Married  ? ? ?ECOG Status: ?1 - Symptomatic but completely ambulatory ? ?Review of Systems ? ?Review of Systems: A 12 point ROS discussed and pertinent positives are indicated in the HPI above.  All other systems are negative. ? ?Physical Exam ?No direct physical exam was performed (except for noted visual exam findings with Video Visits).  ? ?Vital Signs: ?There were no vitals taken for this visit. ? ?Imaging: ?DG Knee Complete 4 Views Left ? ?Result Date: 10/31/2021 ?CLINICAL DATA:  Left knee pain. EXAM: LEFT KNEE - COMPLETE 4+ VIEW COMPARISON:  None Available. FINDINGS: Mild lateral and moderate to large superior patellar degenerative osteophytes. Tiny joint effusion. Moderate patellofemoral joint space narrowing. Mild patella alta. Moderate medial compartment joint space narrowing. Mild lateral greater than medial compartment chondrocalcinosis. No acute fracture or dislocation. 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 Electronically Signed   By: Yvonne Kendall M.D.   On: 10/31/2021 10:48   ? ?Labs: ? ?CBC: ?Recent Labs  ?  02/06/21 ?1202 05/06/21 ?0835  ?WBC 4.3 4.5  ?HGB 13.1 13.4  ?HCT 39.8 40.1  ?PLT 199.0 199.0  ? ? ?COAGS: ?No results for  input(s): INR, APTT in the last 8760 hours. ? ?BMP: ?Recent Labs  ?  05/06/21 ?1497  ?NA 139  ?K 4.2  ?CL 106  ?CO2 27  ?GLUCOSE 113*  ?BUN 14  ?CALCIUM 9.4  ?CREATININE 1.09  ? ? ?LIVER FUNCTION TESTS: ?Rec

## 2021-11-11 NOTE — Assessment & Plan Note (Signed)
Completed shockwave therapy  

## 2021-11-11 NOTE — Progress Notes (Signed)
?  [name] - 76 y.o. male MRN [id number]  Date of birth: 06/22/1946 ? ?SUBJECTIVE:  Including CC & ROS.  ?No chief complaint on file. ? ? ?[name] is a 76 y.o. male that is here for shockwave therapy. ? ? ? ?Review of Systems ?See HPI  ? ?HISTORY: Past Medical, Surgical, Social, and Family History Reviewed & Updated per EMR.   ?Pertinent Historical Findings include: ? ?Past Medical History:  ?Diagnosis Date  ? Abnormal CT scan, gastrointestinal tract suspect angioedema   ? Anemia 03/16/2016  ? IRON  ? Anxiety   ? Arthritis   ? Back pain 10/10/2012  ? Fatty liver 2005  ? seen on 2005 ultrasound,05/2015 CT.   ? Hemorrhoids 08/22/2016  ? Hemorrhoids, internal, with bleeding 2005  ? internal and external. 2005 band ligation (dr Ferdinand Lango in [facility])  ? Hyperglycemia 08/21/2013  ? Hyperlipidemia   ? Hypertension   ? Nocturia 08/21/2013  ? Obesity (BMI 30.0-34.9)   ? Osteoporosis   ? Pneumonia   ? as a child  ? Rectal bleeding 07/30/2014  ? Renal cyst   ? SBO (small bowel obstruction) (HCC)   ? Tubular adenoma of colon before 2005, 2012, 08/2014  ? Unspecified constipation 02/06/2014  ? Vitamin D deficiency   ? ? ?Past Surgical History:  ?Procedure Laterality Date  ? COLONOSCOPY  before 2005, 2012, 2013, 08/2014.   ? ENTEROSCOPY N/A 10/05/2017  ? Procedure: ENTEROSCOPY;  Surgeon: Gatha Mayer, MD;  Location: [facility];  Service: Endoscopy;  Laterality: N/A;  ? FRACTURE SURGERY Left 1989  ? leg  ? HEMORRHOID BANDING  2005  ? IR RADIOLOGIST EVAL & MGMT  11/11/2021  ? KNEE SURGERY Left 2010  ? arthroscopy, torn carilage  ? KNEE SURGERY Right 2012  ? arthroscopy  ? TONSILLECTOMY  1957  ? TONSILLECTOMY    ? TOTAL SHOULDER ARTHROPLASTY Left 11/01/2020  ? Procedure: LEFT ANATOMIC SHOULDER REPLACEMENT;  Surgeon: Meredith Pel, MD;  Location: [place];  Service: Orthopedics;  Laterality: Left;  ? VENTRAL HERNIA REPAIR N/A 06/03/2018  ? Procedure: LAPAROSCOPIC VENTRAL WALL  HERNIA  REPAIR WITH MESH  ERAS PATHWAY;  Surgeon:  Michael Boston, MD;  [facility];  Service: General;  Laterality: N/A;  ? ? ? ?PHYSICAL EXAM:  ?VS: Ht '5\' 10"'$  (1.778 m)   Wt 215 lb (97.5 kg)   BMI 30.85 kg/m?  ?Physical Exam ?Gen: NAD, alert, cooperative with exam, well-appearing ?MSK:  ?Neurovascularly intact   ? ?ECSWT Note ?Ihor Dow ?[birth year] ? ?Procedure: ECSWT ?Indications: left shoulder pain  ? ?Procedure Details ?Consent: Risks of procedure as well as the alternatives and risks of each were explained to the (patient/caregiver).  Consent for procedure obtained. ?Time Out: Verified patient identification, verified procedure, site/side was marked, verified correct patient position, special equipment/implants available, medications/allergies/relevent history reviewed, required imaging and test results available.  Performed.  The area was cleaned with iodine and alcohol swabs.   ? ?The left shoulder was targeted for Extracorporeal shockwave therapy.  ? ?Preset: Tendinitis left shoulder ?Power Level: 80 ?Frequency: 10 ?Impulse/cycles: 2300 ?Head size: Medium ?Session: 1 ? ?Patient did tolerate procedure well. ? ? ? ?ASSESSMENT & PLAN:  ? ?S/P shoulder replacement, left ?Completed shockwave therapy ? ? ? ? ?

## 2021-11-19 ENCOUNTER — Encounter: Payer: Self-pay | Admitting: Family Medicine

## 2021-11-19 ENCOUNTER — Ambulatory Visit (INDEPENDENT_AMBULATORY_CARE_PROVIDER_SITE_OTHER): Payer: Self-pay | Admitting: Family Medicine

## 2021-11-19 DIAGNOSIS — Z96612 Presence of left artificial shoulder joint: Secondary | ICD-10-CM

## 2021-11-19 NOTE — Assessment & Plan Note (Signed)
Completed shockwave therapy  

## 2021-11-19 NOTE — Progress Notes (Signed)
  [name] - 76 y.o. male MRN [id number]  Date of birth: 1945-07-25  SUBJECTIVE:  Including CC & ROS.  No chief complaint on file.   [name] is a 76 y.o. male that is  here for shockwave therapy.    Review of Systems See HPI   HISTORY: Past Medical, Surgical, Social, and Family History Reviewed & Updated per EMR.   Pertinent Historical Findings include:  Past Medical History:  Diagnosis Date   Abnormal CT scan, gastrointestinal tract suspect angioedema    Anemia 03/16/2016   IRON   Anxiety    Arthritis    Back pain 10/10/2012   Fatty liver 2005   seen on 2005 ultrasound,05/2015 CT.    Hemorrhoids 08/22/2016   Hemorrhoids, internal, with bleeding 2005   internal and external. 2005 band ligation (dr Ferdinand Lango in [facility])   Hyperglycemia 08/21/2013   Hyperlipidemia    Hypertension    Nocturia 08/21/2013   Obesity (BMI 30.0-34.9)    Osteoporosis    Pneumonia    as a child   Rectal bleeding 07/30/2014   Renal cyst    SBO (small bowel obstruction) ([place])    Tubular adenoma of colon before 2005, 2012, 08/2014   Unspecified constipation 02/06/2014   Vitamin D deficiency     Past Surgical History:  Procedure Laterality Date   COLONOSCOPY  before 2005, 2012, 2013, 08/2014.    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   Procedure: LEFT ANATOMIC SHOULDER REPLACEMENT;  Surgeon: Meredith Pel, MD;  Location: [place];  Service: Orthopedics;  Laterality: Left;   VENTRAL HERNIA REPAIR N/A 06/03/2018   Procedure: LAPAROSCOPIC VENTRAL WALL  HERNIA  REPAIR WITH MESH  ERAS PATHWAY;  Surgeon:  Michael Boston, MD;  [facility];  Service: General;  Laterality: N/A;     PHYSICAL EXAM:  VS: Ht '5\' 10"'$  (1.778 m)   Wt 215 lb (97.5 kg)   BMI 30.85 kg/m  Physical Exam Gen: NAD, alert, cooperative with exam, well-appearing MSK:  Neurovascularly intact    ECSWT Note [name] Aug 29, 1945  Procedure: ECSWT Indications: left shoulder pain   Procedure Details Consent: Risks of procedure as well as the alternatives and risks of each were explained to the (patient/caregiver).  Consent for procedure obtained. Time Out: Verified patient identification, verified procedure, site/side was marked, verified correct patient position, special equipment/implants available, medications/allergies/relevent history reviewed, required imaging and test results available.  Performed.  The area was cleaned with iodine and alcohol swabs.    The left shoulder was targeted for Extracorporeal shockwave therapy.   Preset: Shoulder tendinitis Power Level: 90 Frequency: 10 Impulse/cycles: 2200 Head size: Medium Session: 2  Patient did tolerate procedure well.    ASSESSMENT & PLAN:   S/P shoulder replacement, left Completed shockwave therapy.

## 2021-12-03 ENCOUNTER — Ambulatory Visit (INDEPENDENT_AMBULATORY_CARE_PROVIDER_SITE_OTHER): Payer: Self-pay | Admitting: Family Medicine

## 2021-12-03 ENCOUNTER — Encounter: Payer: Self-pay | Admitting: Family Medicine

## 2021-12-03 DIAGNOSIS — Z96612 Presence of left artificial shoulder joint: Secondary | ICD-10-CM

## 2021-12-03 NOTE — Assessment & Plan Note (Signed)
Completed shockwave therapy  

## 2021-12-03 NOTE — Progress Notes (Signed)
  [name] - 76 y.o. male MRN [id number]  Date of birth: [birth year]  SUBJECTIVE:  Including CC & ROS.  No chief complaint on file.   [name] is a 76 y.o. male that is here for shockwave therapy.    Review of Systems See HPI   HISTORY: Past Medical, Surgical, Social, and Family History Reviewed & Updated per EMR.   Pertinent Historical Findings include:  Past Medical History:  Diagnosis Date   Abnormal CT scan, gastrointestinal tract suspect angioedema    Anemia 03/16/2016   IRON   Anxiety    Arthritis    Back pain 10/10/2012   Fatty liver 2005   seen on 2005 ultrasound,05/2015 CT.    Hemorrhoids 08/22/2016   Hemorrhoids, internal, with bleeding 2005   internal and external. 2005 band ligation (dr Ferdinand Lango in [facility])   Hyperglycemia 08/21/2013   Hyperlipidemia    Hypertension    Nocturia 08/21/2013   Obesity (BMI 30.0-34.9)    Osteoporosis    Pneumonia    as a child   Rectal bleeding 07/30/2014   Renal cyst    SBO (small bowel obstruction) ([place])    Tubular adenoma of colon before 2005, 2012, 08/2014   Unspecified constipation 02/06/2014   Vitamin D deficiency     Past Surgical History:  Procedure Laterality Date   COLONOSCOPY  before 2005, 2012, 2013, 08/2014.    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   Procedure: LEFT ANATOMIC SHOULDER REPLACEMENT;  Surgeon: Meredith Pel, MD;  Location: [place];  Service: Orthopedics;  Laterality: Left;   VENTRAL HERNIA REPAIR N/A 06/03/2018   Procedure: LAPAROSCOPIC VENTRAL WALL  HERNIA  REPAIR WITH MESH  ERAS PATHWAY;  Surgeon:  Michael Boston, MD;  [facility];  Service: General;  Laterality: N/A;     PHYSICAL EXAM:  VS: Ht '5\' 10"'$  (1.778 m)   Wt 215 lb (97.5 kg)   BMI 30.85 kg/m  Physical Exam Gen: NAD, alert, cooperative with exam, well-appearing MSK:  Neurovascularly intact    ECSWT Note [name] [birth year]  Procedure: ECSWT Indications: left shoulder pain  Procedure Details Consent: Risks of procedure as well as the alternatives and risks of each were explained to the (patient/caregiver).  Consent for procedure obtained. Time Out: Verified patient identification, verified procedure, site/side was marked, verified correct patient position, special equipment/implants available, medications/allergies/relevent history reviewed, required imaging and test results available.  Performed.  The area was cleaned with iodine and alcohol swabs.    The left shoulder was targeted for Extracorporeal shockwave therapy.   Preset: shoulder tendinitis Power Level: 100 Frequency: 10 Impulse/cycles: 2200 Head size: Medium Session: 3  Patient did tolerate procedure well.    ASSESSMENT & PLAN:   S/P shoulder replacement, left Completed shockwave therapy

## 2021-12-10 ENCOUNTER — Ambulatory Visit: Payer: Medicare Other | Admitting: Family Medicine

## 2022-01-07 ENCOUNTER — Ambulatory Visit (INDEPENDENT_AMBULATORY_CARE_PROVIDER_SITE_OTHER): Payer: Self-pay | Admitting: Family Medicine

## 2022-01-07 ENCOUNTER — Encounter: Payer: Self-pay | Admitting: Family Medicine

## 2022-01-07 DIAGNOSIS — Z96612 Presence of left artificial shoulder joint: Secondary | ICD-10-CM

## 2022-01-07 NOTE — Assessment & Plan Note (Signed)
Completed shockwave therapy  

## 2022-01-07 NOTE — Progress Notes (Signed)
  [name] - 76 y.o. male MRN [id number]  Date of birth: 11-Jul-1945  SUBJECTIVE:  Including CC & ROS.  No chief complaint on file.   [name] is a 76 y.o. male that is here for shockwave therapy.   Review of Systems See HPI   HISTORY: Past Medical, Surgical, Social, and Family History Reviewed & Updated per EMR.   Pertinent Historical Findings include:  Past Medical History:  Diagnosis Date   Abnormal CT scan, gastrointestinal tract suspect angioedema    Anemia 03/16/2016   IRON   Anxiety    Arthritis    Back pain 10/10/2012   Fatty liver 2005   seen on 2005 ultrasound,05/2015 CT.    Hemorrhoids 08/22/2016   Hemorrhoids, internal, with bleeding 2005   internal and external. 2005 band ligation (dr Ferdinand Lango in [facility])   Hyperglycemia 08/21/2013   Hyperlipidemia    Hypertension    Nocturia 08/21/2013   Obesity (BMI 30.0-34.9)    Osteoporosis    Pneumonia    as a child   Rectal bleeding 07/30/2014   Renal cyst    SBO (small bowel obstruction) ([place])    Tubular adenoma of colon before 2005, 2012, 08/2014   Unspecified constipation 02/06/2014   Vitamin D deficiency     Past Surgical History:  Procedure Laterality Date   COLONOSCOPY  before 2005, 2012, 2013, 08/2014.    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   Procedure: LEFT ANATOMIC SHOULDER REPLACEMENT;  Surgeon: Meredith Pel, MD;  Location: [place];  Service: Orthopedics;  Laterality: Left;   VENTRAL HERNIA REPAIR N/A 06/03/2018   Procedure: LAPAROSCOPIC VENTRAL WALL  HERNIA  REPAIR WITH MESH  ERAS PATHWAY;  Surgeon: Michael Boston, MD;  [facility];  Service: General;  Laterality: N/A;     PHYSICAL EXAM:  VS: There were no vitals taken for this visit. Physical Exam Gen: NAD, alert, cooperative with exam, well-appearing MSK:  Neurovascularly intact    ECSWT Note [name] 04/18/1946  Procedure: ECSWT Indications: left shoulder pain  Procedure Details Consent: Risks of procedure as well as the alternatives and risks of each were explained to the (patient/caregiver).  Consent for procedure obtained. Time Out: Verified patient identification, verified procedure, site/side was marked, verified correct patient position, special equipment/implants available, medications/allergies/relevent history reviewed, required imaging and test results available.  Performed.  The area was cleaned with iodine and alcohol swabs.    The left shoulder was targeted for Extracorporeal shockwave therapy.   Preset: shoulder tendinitis  Power Level: 100 Frequency: 10 Impulse/cycles: 2300 Head size: medium  Session: 4  Patient did tolerate procedure well.    ASSESSMENT & PLAN:   S/P shoulder replacement, left Completed shockwave therapy

## 2022-01-20 ENCOUNTER — Ambulatory Visit (INDEPENDENT_AMBULATORY_CARE_PROVIDER_SITE_OTHER): Payer: Medicare Other | Admitting: Family Medicine

## 2022-01-20 ENCOUNTER — Encounter: Payer: Self-pay | Admitting: Family Medicine

## 2022-01-20 VITALS — BP 138/86 | Ht 70.0 in | Wt 212.0 lb

## 2022-01-20 DIAGNOSIS — M1711 Unilateral primary osteoarthritis, right knee: Secondary | ICD-10-CM | POA: Diagnosis not present

## 2022-01-20 MED ORDER — METHYLPREDNISOLONE ACETATE 40 MG/ML IJ SUSP
40.0000 mg | Freq: Once | INTRAMUSCULAR | Status: AC
  Administered 2022-01-20: 40 mg via INTRA_ARTICULAR

## 2022-01-20 NOTE — Patient Instructions (Signed)
Your pain is due to arthritis. These are the different medications you can take for this: Tylenol '500mg'$  1-2 tabs three times a day for pain. Voltaren, capsaicin, aspercreme, or biofreeze topically up to four times a day may also help with pain. Some supplements that may help for arthritis: Boswellia extract, curcumin, pycnogenol Aleve 1-2 tabs twice a day with food if needed. Cortisone injections are an option - you were given this today. If cortisone injections do not help, there are different types of shots that may help but they take longer to take effect (gel injections). It's important that you continue to stay active. Straight leg raises, knee extensions 3 sets of 10 once a day (add ankle weight if these become too easy). Consider physical therapy to strengthen muscles around the joint that hurts to take pressure off of the joint itself. Shoe inserts with good arch support may be helpful. Heat or ice 15 minutes at a time 3-4 times a day as needed to help with pain. Water aerobics and cycling with low resistance are the best two types of exercise for arthritis though any exercise is ok as long as it doesn't worsen the pain. Follow up with me as needed.

## 2022-01-21 ENCOUNTER — Other Ambulatory Visit: Payer: Self-pay | Admitting: Family Medicine

## 2022-01-21 ENCOUNTER — Encounter: Payer: Self-pay | Admitting: Family Medicine

## 2022-01-21 ENCOUNTER — Telehealth: Payer: Self-pay | Admitting: Family Medicine

## 2022-01-21 MED ORDER — AMLODIPINE BESYLATE 5 MG PO TABS
5.0000 mg | ORAL_TABLET | Freq: Every day | ORAL | 1 refills | Status: DC
Start: 2022-01-21 — End: 2022-04-30

## 2022-01-21 MED ORDER — LIVALO 2 MG PO TABS
1.0000 | ORAL_TABLET | Freq: Every day | ORAL | 0 refills | Status: DC
Start: 2022-01-21 — End: 2022-10-14

## 2022-01-21 NOTE — Telephone Encounter (Signed)
Medication: amLODipine (NORVASC) 5 MG tablet [[id number]] , LIVALO 2 MG TABS [[id number]] , losartan (COZAAR) 50 MG tablet [[id number]] , and metoprolol succinate (TOPROL-XL) 50 MG 24 hr tablet [[id number]] ( PATIENT ALL OUT OF THIS LAST ONE)   Has the patient contacted their pharmacy? Yes.   (If no, request that the patient contact the pharmacy for the refill.) (If yes, when and what did the pharmacy advise?)  Preferred Pharmacy (with phone number or street name): [facility]  [address] [place], [place] [address] [phone]  Agent: Please be advised that RX refills may take up to 3 business days. We ask that you follow-up with your pharmacy.

## 2022-01-21 NOTE — Telephone Encounter (Signed)
Refill sent.

## 2022-01-21 NOTE — Progress Notes (Signed)
PCP: Mosie Lukes, MD  Subjective:   HPI: Patient is a 76 y.o. male here for right knee pain.  Patient was last seen for his right knee on 4/3 and had zilretta injection. He reports this helped significantly until about 2 weeks ago. No injury or trauma. Started to get medial right knee pain again. Has tried ibuprofen with mild relief.  Past Medical History:  Diagnosis Date   Abnormal CT scan, gastrointestinal tract suspect angioedema    Anemia 03/16/2016   IRON   Anxiety    Arthritis    Back pain 10/10/2012   Fatty liver 2005   seen on 2005 ultrasound,05/2015 CT.    Hemorrhoids 08/22/2016   Hemorrhoids, internal, with bleeding 2005   internal and external. 2005 band ligation (dr Ferdinand Lango in [facility])   Hyperglycemia 08/21/2013   Hyperlipidemia    Hypertension    Nocturia 08/21/2013   Obesity (BMI 30.0-34.9)    Osteoporosis    Pneumonia    as a child   Rectal bleeding 07/30/2014   Renal cyst    SBO (small bowel obstruction) ([place])    Tubular adenoma of colon before 2005, 2012, 08/2014   Unspecified constipation 02/06/2014   Vitamin D deficiency     Current Outpatient Medications on File Prior to Visit  Medication Sig Dispense Refill   acetaminophen (TYLENOL) 325 MG tablet Take 1-2 tablets (325-650 mg total) by mouth every 6 (six) hours as needed for mild pain (pain score 1-3 or temp > 100.5). 100 tablet 0   ALPRAZolam (XANAX) 1 MG tablet TAKE 1 TABLET (1 MG TOTAL) BY MOUTH AT BEDTIME AS NEEDED FOR ANXIETY. 30 tablet 0   amLODipine (NORVASC) 5 MG tablet TAKE 1 TABLET DAILY 90 tablet 1   hydrocortisone (ANUSOL-HC) 25 MG suppository Unwrap and place 1 suppository (25 mg total) rectally 2 (two) times daily. 12 suppository 1   hyoscyamine (ANASPAZ) 0.125 MG TBDP disintergrating tablet Place 1 tablet (0.125 mg total) under the tongue every 4 (four) hours as needed for cramping. 30 tablet 2   LIVALO 2 MG TABS TAKE 1 TABLET DAILY 90 tablet 1   losartan (COZAAR) 50 MG tablet TAKE 1  TABLET DAILY 90 tablet 3   metoprolol succinate (TOPROL-XL) 50 MG 24 hr tablet TAKE 1 TABLET DAILY 90 tablet 3   Multiple Vitamins-Minerals (MULTIVITAMIN ADULT PO) Take 2 tablets by mouth daily.     sildenafil (REVATIO) 20 MG tablet TAKE 1 TO 5 TABLETS BY MOUTH AS NEEDED 1 HOUR PRIOR TO INTERCOURSE 20 tablet 3   tamsulosin (FLOMAX) 0.4 MG CAPS capsule Take 0.4 mg by mouth daily.     trolamine salicylate (ASPERCREME) 10 % cream Apply 1 application topically as needed for muscle pain.     No current facility-administered medications on file prior to visit.    Past Surgical History:  Procedure Laterality Date   COLONOSCOPY  before 2005, 2012, 2013, 08/2014.    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   Procedure: LEFT ANATOMIC SHOULDER REPLACEMENT;  Surgeon: Meredith Pel,  MD;  Location: [facility];  Service: Orthopedics;  Laterality: Left;   VENTRAL HERNIA REPAIR N/A 06/03/2018   Procedure: LAPAROSCOPIC VENTRAL WALL  HERNIA  REPAIR WITH MESH  ERAS PATHWAY;  Surgeon: Michael Boston, MD;  Location: [facility];  Service: General;  Laterality: N/A;    No Known Allergies  BP 138/86 (BP Location: Left Arm, Patient Position: Sitting, Cuff Size: Normal)   Ht '5\' 10"'$  (1.778 m)   Wt 212 lb (96.2 kg)   BMI 30.42 kg/m       No data to display              No data to display              Objective:  Physical Exam:  Gen: NAD, comfortable in exam room  Right knee: No gross deformity, ecchymoses, swelling. TTP medial joint line. FROM with normal strength. Negative ant/post drawers. Negative  valgus/varus testing. Negative lachman.  Negative mcmurrays, apleys.  NV intact distally.   Assessment & Plan:  1. Right knee pain - 2/2 known osteoarthritis.  Had zilretta in April.  Steroid injection with depomedrol given today.  Discussed tylenol, topical medications, supplements, aleve if needed.  F/u prn.  After informed written consent timeout was performed, patient was seated on exam table. Right knee was prepped with alcohol swab and utilizing anteromedial approach, patient's right knee was injected intraarticularly with 3:1 lidocaine: depomedrol. Patient tolerated the procedure well without immediate complications

## 2022-01-23 ENCOUNTER — Telehealth: Payer: Self-pay | Admitting: Family Medicine

## 2022-01-23 ENCOUNTER — Other Ambulatory Visit: Payer: Self-pay

## 2022-01-23 MED ORDER — METOPROLOL SUCCINATE ER 50 MG PO TB24: 50.0000 mg | ORAL_TABLET | Freq: Every day | ORAL | 0 refills | Status: DC

## 2022-01-23 NOTE — Telephone Encounter (Signed)
Pt called stating that his metoprolol succinate is completely out and he needed it sent to the following pharmacy [facility] of [facility]:  [facility], [place], [place] [address] P: [phone]

## 2022-01-23 NOTE — Telephone Encounter (Signed)
Refill sent.

## 2022-01-28 ENCOUNTER — Telehealth: Payer: Self-pay | Admitting: Family Medicine

## 2022-01-28 NOTE — Telephone Encounter (Signed)
Submitted request for bilateral gel injections (orthovisc/monovisc) today.

## 2022-01-31 DIAGNOSIS — H2511 Age-related nuclear cataract, right eye: Secondary | ICD-10-CM | POA: Diagnosis not present

## 2022-01-31 DIAGNOSIS — H2512 Age-related nuclear cataract, left eye: Secondary | ICD-10-CM | POA: Diagnosis not present

## 2022-01-31 NOTE — Telephone Encounter (Signed)
Received benefit summary from [facility] for [place]. However, I do not know if patient wants HA gel injection or Zilretta injection. I called patient for clarification and left message for him to call back.

## 2022-02-10 ENCOUNTER — Ambulatory Visit (INDEPENDENT_AMBULATORY_CARE_PROVIDER_SITE_OTHER): Payer: Medicare Other | Admitting: Family Medicine

## 2022-02-10 ENCOUNTER — Encounter: Payer: Self-pay | Admitting: Family Medicine

## 2022-02-10 VITALS — BP 133/76 | HR 80 | Ht 70.0 in | Wt 219.0 lb

## 2022-02-10 DIAGNOSIS — F419 Anxiety disorder, unspecified: Secondary | ICD-10-CM | POA: Diagnosis not present

## 2022-02-10 DIAGNOSIS — R109 Unspecified abdominal pain: Secondary | ICD-10-CM | POA: Diagnosis not present

## 2022-02-10 MED ORDER — ALPRAZOLAM 1 MG PO TABS: ORAL_TABLET | ORAL | 0 refills | Status: DC

## 2022-02-10 NOTE — Progress Notes (Signed)
   Acute Office Visit  Subjective:     Patient ID: [name], male    DOB: [birth year], 76 y.o.   MRN: [id number]  CC: left side pain  HPI Patient is in today for left sided pain and swelling.   Patient reports he has been having years of intermittent swelling and pain to his left abdomen/ribs/flank area. States it comes and goes periodically, but most recent flare started last Friday (3 days ago). States this episode was more severe that prior flares. Pain got up to 8/10 and he had visible/palpable swelling to left side that felt firm to him. Reports it always happens in the same spot. He has not noticed any other skin changes. He used ice and topical analgesics which have helped and pain today is 5/10 with swelling significantly improved. He was seen last November for same complaints - labs unremarkable and rib xray normal. He would like to further investigate since this episode was more severe. He denies any other abdominal pain, back pain, urinary changes, rashes, fever, chills.     ROS All review of systems negative except what is listed in the HPI      Objective:    BP 133/76   Pulse 80   Ht '5\' 10"'$  (1.778 m)   Wt 219 lb (99.3 kg)   BMI 31.42 kg/m    Physical Exam Vitals reviewed.  Constitutional:      Appearance: Normal appearance.  Cardiovascular:     Rate and Rhythm: Normal rate and regular rhythm.  Pulmonary:     Effort: Pulmonary effort is normal.     Breath sounds: Normal breath sounds.  Abdominal:     General: Abdomen is flat. Bowel sounds are normal. There is no distension.     Palpations: Abdomen is soft. There is no mass.     Tenderness: There is no right CVA tenderness, left CVA tenderness, guarding or rebound.     Hernia: No hernia is present.     Comments: Left side (mid-axillary) with tenderness to palpation and mild edema.  Musculoskeletal:        General: Normal range of motion.  Skin:    General: Skin is warm and dry.  Neurological:      General: No focal deficit present.     Mental Status: He is alert and oriented to person, place, and time. Mental status is at baseline.  Psychiatric:        Mood and Affect: Mood normal.        Behavior: Behavior normal.        Thought Content: Thought content normal.        Judgment: Judgment normal.        No results found for any visits on 02/10/22.      Assessment & Plan:   1. Left sided abdominal pain No red flags on exam. Xray last year was stable. We discussed options and you opted for ultrasound.  We will let you know results.  Could be muscular - continue with ice, topical creams, etc.  - US Abdomen Limited; Future  2. Anxiety Requesting Xanax refill. Reports he only is using once every week or two for evening anxiety that interferes with sleep. PDMP reviewed.  - ALPRAZolam (XANAX) 1 MG tablet; TAKE 1 TABLET (1 MG TOTAL) BY MOUTH AT BEDTIME AS NEEDED FOR ANXIETY.  Dispense: 30 tablet; Refill: 0   Return if symptoms worsen or fail to improve.  Terrilyn Saver, NP

## 2022-02-10 NOTE — Patient Instructions (Signed)
Xray last year was stable. We discussed options and you opted for ultrasound.  We will let you know results.  Could be muscular - continue with ice, topical creams, etc.

## 2022-02-11 ENCOUNTER — Ambulatory Visit (HOSPITAL_BASED_OUTPATIENT_CLINIC_OR_DEPARTMENT_OTHER)
Admission: RE | Admit: 2022-02-11 | Discharge: 2022-02-11 | Disposition: A | Discharge status: Home / Self Care | Payer: Medicare Other | Source: Ambulatory Visit | Attending: Family Medicine | Admitting: Family Medicine

## 2022-02-11 DIAGNOSIS — R109 Unspecified abdominal pain: Secondary | ICD-10-CM | POA: Diagnosis not present

## 2022-02-14 DIAGNOSIS — H2511 Age-related nuclear cataract, right eye: Secondary | ICD-10-CM | POA: Diagnosis not present

## 2022-02-21 ENCOUNTER — Telehealth: Payer: Self-pay | Admitting: Family Medicine

## 2022-02-21 NOTE — Telephone Encounter (Signed)
Sent request for zilretta right knee today. Uploaded 05/09 office note and 05/09 imaging to portal.

## 2022-02-21 NOTE — Telephone Encounter (Signed)
I spoke to pt- he states his left knee is doing good. He states he would like to proceed with Zilretta for his right knee. Junie Panning will verify benefits for Zilretta. I will call pt back with OOP info and to schedule.

## 2022-02-25 NOTE — Telephone Encounter (Signed)
Zilretta injection and OV is covered at 100%. Baker Janus to call and schedule OV.

## 2022-02-26 ENCOUNTER — Ambulatory Visit: Payer: Self-pay

## 2022-02-26 ENCOUNTER — Ambulatory Visit (INDEPENDENT_AMBULATORY_CARE_PROVIDER_SITE_OTHER): Payer: Medicare Other | Admitting: Family Medicine

## 2022-02-26 DIAGNOSIS — M1711 Unilateral primary osteoarthritis, right knee: Secondary | ICD-10-CM

## 2022-02-26 MED ORDER — TRIAMCINOLONE ACETONIDE 32 MG IX SRER
32.0000 mg | Freq: Once | INTRA_ARTICULAR | Status: AC
  Administered 2022-02-26: 32 mg via INTRA_ARTICULAR

## 2022-02-26 NOTE — Progress Notes (Signed)
[name] - 76 y.o. male MRN [id number]  Date of birth: Mar 15, 1946  SUBJECTIVE:  Including CC & ROS.  No chief complaint on file.   [name] is a 76 y.o. male that is  here for zilretta injection.    Review of Systems See HPI   HISTORY: Past Medical, Surgical, Social, and Family History Reviewed & Updated per EMR.   Pertinent Historical Findings include:  Past Medical History:  Diagnosis Date   Abnormal CT scan, gastrointestinal tract suspect angioedema    Anemia 03/16/2016   IRON   Anxiety    Arthritis    Back pain 10/10/2012   Fatty liver 2005   seen on 2005 ultrasound,05/2015 CT.    Hemorrhoids 08/22/2016   Hemorrhoids, internal, with bleeding 2005   internal and external. 2005 band ligation (dr Ferdinand Lango in [facility])   Hyperglycemia 08/21/2013   Hyperlipidemia    Hypertension    Nocturia 08/21/2013   Obesity (BMI 30.0-34.9)    Osteoporosis    Pneumonia    as a child   Rectal bleeding 07/30/2014   Renal cyst    SBO (small bowel obstruction) ([place])    Tubular adenoma of colon before 2005, 2012, 08/2014   Unspecified constipation 02/06/2014   Vitamin D deficiency     Past Surgical History:  Procedure Laterality Date   COLONOSCOPY  before 2005, 2012, 2013, 08/2014.    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   Procedure: LEFT ANATOMIC SHOULDER REPLACEMENT;  Surgeon: Meredith Pel, MD;  Location: [facility];  Service: Orthopedics;  Laterality: Left;   VENTRAL HERNIA REPAIR N/A 06/03/2018   Procedure: LAPAROSCOPIC VENTRAL WALL  HERNIA  REPAIR WITH MESH  ERAS PATHWAY;  Surgeon:  Michael Boston, MD;  [facility];  Service: General;  Laterality: N/A;     PHYSICAL EXAM:  VS: BP 135/86   Ht '5\' 11"'$  (1.803 m)   Wt 215 lb (97.5 kg)   BMI 29.99 kg/m  Physical Exam Gen: NAD, alert, cooperative with exam, well-appearing MSK:  Neurovascularly intact     Aspiration/Injection Procedure Note [name] 02-26-1946  Procedure: Injection Indications: right knee pain  Procedure Details Consent: Risks of procedure as well as the alternatives and risks of each were explained to the (patient/caregiver).  Consent for procedure obtained. Time Out: Verified patient identification, verified procedure, site/side was marked, verified correct patient position, special equipment/implants available, medications/allergies/relevent history reviewed, required imaging and test results available.  Performed.  The area was cleaned with iodine and alcohol swabs.    The right knee superior lateral suprapatellar pouch was injected using 3 cc of 1% lidocaine on a 21-gauge 1-1/2 inch needle.    The syringe was switched and a mixture containing 5 cc's of 32 mg Zilretta and 4 cc's of 0.25% bupivacaine was injected.  Ultrasound was used. Images were obtained in long views showing the injection.    A sterile dressing was applied.  Patient did tolerate procedure well.        ASSESSMENT & PLAN:   OA (osteoarthritis)  of knee Completed zilretta injection.

## 2022-02-26 NOTE — Assessment & Plan Note (Signed)
Completed zilretta injection.  ?

## 2022-03-06 DIAGNOSIS — N401 Enlarged prostate with lower urinary tract symptoms: Secondary | ICD-10-CM | POA: Diagnosis not present

## 2022-03-13 DIAGNOSIS — N528 Other male erectile dysfunction: Secondary | ICD-10-CM | POA: Diagnosis not present

## 2022-03-13 DIAGNOSIS — R3912 Poor urinary stream: Secondary | ICD-10-CM | POA: Diagnosis not present

## 2022-03-13 DIAGNOSIS — N401 Enlarged prostate with lower urinary tract symptoms: Secondary | ICD-10-CM | POA: Diagnosis not present

## 2022-03-31 ENCOUNTER — Other Ambulatory Visit (HOSPITAL_BASED_OUTPATIENT_CLINIC_OR_DEPARTMENT_OTHER): Payer: Self-pay

## 2022-03-31 DIAGNOSIS — Z23 Encounter for immunization: Secondary | ICD-10-CM | POA: Diagnosis not present

## 2022-03-31 MED ORDER — FLUAD QUADRIVALENT 0.5 ML IM PRSY
PREFILLED_SYRINGE | INTRAMUSCULAR | 0 refills | Status: DC
  Filled 2022-03-31: qty 0.5, 1d supply, fill #0

## 2022-04-16 ENCOUNTER — Other Ambulatory Visit: Payer: Self-pay | Admitting: Family Medicine

## 2022-04-16 ENCOUNTER — Other Ambulatory Visit (HOSPITAL_BASED_OUTPATIENT_CLINIC_OR_DEPARTMENT_OTHER): Payer: Self-pay

## 2022-04-16 DIAGNOSIS — Z23 Encounter for immunization: Secondary | ICD-10-CM | POA: Diagnosis not present

## 2022-04-16 MED ORDER — COMIRNATY 30 MCG/0.3ML IM SUSY
PREFILLED_SYRINGE | INTRAMUSCULAR | 0 refills | Status: DC
  Filled 2022-04-16: qty 0.3, 1d supply, fill #0

## 2022-04-21 ENCOUNTER — Telehealth: Payer: Self-pay | Admitting: *Deleted

## 2022-04-21 MED ORDER — MELOXICAM 15 MG PO TABS: 15.0000 mg | ORAL_TABLET | Freq: Every day | ORAL | 1 refills | Status: DC

## 2022-04-21 NOTE — Telephone Encounter (Signed)
-----   Message from Elberta Spaniel sent at 04/21/2022 11:45 AM EDT ----- Regarding: Pt called to request Anti inflammatory Rx (unsure if OV required? Pt cld states the injections have worn off and he is requesting an Anti inflammatory Rx, pt says will have an OV if required(he is taking OTC pain meds to try to ease the pain )  Please send Rx to :   [id number] [place], [place] - [address] Phone:  [phone]:  [phone]

## 2022-04-22 ENCOUNTER — Encounter: Payer: Self-pay | Admitting: Family Medicine

## 2022-04-22 ENCOUNTER — Ambulatory Visit (INDEPENDENT_AMBULATORY_CARE_PROVIDER_SITE_OTHER): Payer: Medicare Other | Admitting: Family Medicine

## 2022-04-22 ENCOUNTER — Ambulatory Visit: Payer: Self-pay

## 2022-04-22 VITALS — BP 118/78 | Ht 71.0 in | Wt 215.0 lb

## 2022-04-22 DIAGNOSIS — M1712 Unilateral primary osteoarthritis, left knee: Secondary | ICD-10-CM

## 2022-04-22 MED ORDER — KETOROLAC TROMETHAMINE 30 MG/ML IJ SOLN
30.0000 mg | Freq: Once | INTRAMUSCULAR | Status: AC
  Administered 2022-04-22: 30 mg via INTRAMUSCULAR

## 2022-04-22 MED ORDER — PREDNISONE 5 MG PO TABS: ORAL_TABLET | ORAL | 0 refills | Status: DC

## 2022-04-22 NOTE — Assessment & Plan Note (Signed)
Acutely occurring.  Having significant pain with flexion and extension.  Did well with the previous Zilretta injection until recently. -Counseled on home exercise therapy and supportive care. -Toradol intra-articular injection today. -Prednisone. -Could consider further imaging or arthroplasty.

## 2022-04-22 NOTE — Progress Notes (Signed)
[name] - 76 y.o. male MRN [id number]  Date of birth: 1946/06/24  SUBJECTIVE:  Including CC & ROS.  No chief complaint on file.   [name] is a 76 y.o. male that is presenting with acute right knee pain.  The pain has acutely exacerbated.  He did well until a few days ago with his most recent injection.    Review of Systems See HPI   HISTORY: Past Medical, Surgical, Social, and Family History Reviewed & Updated per EMR.   Pertinent Historical Findings include:  Past Medical History:  Diagnosis Date   Abnormal CT scan, gastrointestinal tract suspect angioedema    Anemia 03/16/2016   IRON   Anxiety    Arthritis    Back pain 10/10/2012   Fatty liver 2005   seen on 2005 ultrasound,05/2015 CT.    Hemorrhoids 08/22/2016   Hemorrhoids, internal, with bleeding 2005   internal and external. 2005 band ligation (dr [name] in [facility])   Hyperglycemia 08/21/2013   Hyperlipidemia    Hypertension    Nocturia 08/21/2013   Obesity (BMI 30.0-34.9)    Osteoporosis    Pneumonia    as a child   Rectal bleeding 07/30/2014   Renal cyst    SBO (small bowel obstruction) ([place])    Tubular adenoma of colon before 2005, 2012, 08/2014   Unspecified constipation 02/06/2014   Vitamin D deficiency     Past Surgical History:  Procedure Laterality Date   COLONOSCOPY  before 2005, 2012, 2013, 08/2014.    ENTEROSCOPY N/A 10/05/2017   Procedure: ENTEROSCOPY;  Surgeon: Charles Mayer, MD;  [facility];  Service: Endoscopy;  Laterality: N/A;   FRACTURE SURGERY Left 1989   leg   HEMORRHOID BANDING  2005   IR RADIOLOGIST EVAL & MGMT  11/11/2021   KNEE SURGERY Left 2010   arthroscopy, torn carilage   KNEE SURGERY Right 2012   arthroscopy   TONSILLECTOMY  1957   TONSILLECTOMY     TOTAL SHOULDER ARTHROPLASTY Left 11/01/2020   Procedure: LEFT ANATOMIC SHOULDER REPLACEMENT;  Surgeon: Charles Pel, MD;  Location: [facility];  Service: Orthopedics;  Laterality: Left;   VENTRAL HERNIA  REPAIR N/A 06/03/2018   Procedure: LAPAROSCOPIC VENTRAL WALL  HERNIA  REPAIR WITH MESH  ERAS PATHWAY;  Surgeon: Charles Boston, MD;  [facility];  Service: General;  Laterality: N/A;     PHYSICAL EXAM:  VS: BP 118/78 (BP Location: Left Arm, Patient Position: Sitting)   Ht '5\' 11"'$  (1.803 m)   Wt 215 lb (97.5 kg)   BMI 29.99 kg/m  Physical Exam Gen: NAD, alert, cooperative with exam, well-appearing MSK:  Neurovascularly intact     Aspiration/Injection Procedure Note [name] 04/26/1946  Procedure: Aspiration and Injection Indications: Right knee pain  Procedure Details Consent: Risks of procedure as well as the alternatives and risks of each were explained to the (patient/caregiver).  Consent for procedure obtained. Time Out: Verified patient identification, verified procedure, site/side was marked, verified correct patient position, special equipment/implants available, medications/allergies/relevent history reviewed, required imaging and test results available.  Performed.  The area was cleaned with iodine and alcohol swabs.    The right knee superior lateral suprapatellar pouch was injected using 3 cc of 1% lidocaine on a 22-gauge 1-1/2 inch needle.  An 18-gauge 1-1/2 inch needle was used to achieve aspiration.  The syringe was switched and a mixture containing 1 cc's of 30 mg Toradol and 4 cc's of 0.25% bupivacaine was injected.  Ultrasound  was used. Images were obtained in long views showing the injection.    Amount of Fluid Aspirated:  92m Character of Fluid: clear and straw colored Fluid was sent for: n/a  A sterile dressing was applied.  Patient did tolerate procedure well.     ASSESSMENT & PLAN:   OA (osteoarthritis) of knee Acutely occurring.  Having significant pain with flexion and extension.  Did well with the previous Zilretta injection until recently. -Counseled on home exercise therapy and supportive care. -Toradol intra-articular injection  today. -Prednisone. -Could consider further imaging or arthroplasty.

## 2022-04-22 NOTE — Patient Instructions (Signed)
Good to see you Please use ice as needed  You can take the prednisone if needed. Do not take with ibuprofen or mobic if you do start the prescription.   Please send me a message in MyChart with any questions or updates.  Please see me back in 2 months.   --Dr. Raeford Razor

## 2022-04-24 ENCOUNTER — Ambulatory Visit (INDEPENDENT_AMBULATORY_CARE_PROVIDER_SITE_OTHER): Payer: Medicare Other | Admitting: *Deleted

## 2022-04-24 VITALS — BP 145/88 | HR 78 | Ht 71.0 in | Wt 214.4 lb

## 2022-04-24 DIAGNOSIS — Z0000 Encounter for general adult medical examination without abnormal findings: Secondary | ICD-10-CM

## 2022-04-24 NOTE — Patient Instructions (Signed)
[name] , Thank you for taking time to come for your Medicare Wellness Visit. I appreciate your ongoing commitment to your health goals. Please review the following plan we discussed and let me know if I can assist you in the future.   These are the goals we discussed:  Goals      Patient Stated     Maintain current healthy lifestyle        This is a list of the screening recommended for you and due dates:  Health Maintenance  Topic Date Due   COVID-19 Vaccine (7 - Pfizer risk series) 06/09/2022   Medicare Annual Wellness Visit  05/25/2023   Tetanus Vaccine  12/05/2030   Pneumonia Vaccine  Completed   Flu Shot  Completed   Hepatitis C Screening: USPSTF Recommendation to screen - Ages 18-79 yo.  Completed   Zoster (Shingles) Vaccine  Completed   HPV Vaccine  Aged Out   Colon Cancer Screening  Discontinued     Next appointment: Follow up in one year for your annual wellness visit.   Preventive Care 20 Years and Older, Male Preventive care refers to lifestyle choices and visits with your health care provider that can promote health and wellness. What does preventive care include? A yearly physical exam. This is also called an annual well check. Dental exams once or twice a year. Routine eye exams. Ask your health care provider how often you should have your eyes checked. Personal lifestyle choices, including: Daily care of your teeth and gums. Regular physical activity. Eating a healthy diet. Avoiding tobacco and drug use. Limiting alcohol use. Practicing safe sex. Taking low doses of aspirin every day. Taking vitamin and mineral supplements as recommended by your health care provider. What happens during an annual well check? The services and screenings done by your health care provider during your annual well check will depend on your age, overall health, lifestyle risk factors, and family history of disease. Counseling  Your health care provider may ask you questions  about your: Alcohol use. Tobacco use. Drug use. Emotional well-being. Home and relationship well-being. Sexual activity. Eating habits. History of falls. Memory and ability to understand (cognition). Work and work Statistician. Screening  You may have the following tests or measurements: Height, weight, and BMI. Blood pressure. Lipid and cholesterol levels. These may be checked every 5 years, or more frequently if you are over 32 years old. Skin check. Lung cancer screening. You may have this screening every year starting at age 70 if you have a 30-pack-year history of smoking and currently smoke or have quit within the past 15 years. Fecal occult blood test (FOBT) of the stool. You may have this test every year starting at age 45. Flexible sigmoidoscopy or colonoscopy. You may have a sigmoidoscopy every 5 years or a colonoscopy every 10 years starting at age 67. Prostate cancer screening. Recommendations will vary depending on your family history and other risks. Hepatitis C blood test. Hepatitis B blood test. Sexually transmitted disease (STD) testing. Diabetes screening. This is done by checking your blood sugar (glucose) after you have not eaten for a while (fasting). You may have this done every 1-3 years. Abdominal aortic aneurysm (AAA) screening. You may need this if you are a current or former smoker. Osteoporosis. You may be screened starting at age 38 if you are at high risk. Talk with your health care provider about your test results, treatment options, and if necessary, the need for more tests. Vaccines  Your health care provider may recommend certain vaccines, such as: Influenza vaccine. This is recommended every year. Tetanus, diphtheria, and acellular pertussis (Tdap, Td) vaccine. You may need a Td booster every 10 years. Zoster vaccine. You may need this after age 39. Pneumococcal 13-valent conjugate (PCV13) vaccine. One dose is recommended after age 25. Pneumococcal  polysaccharide (PPSV23) vaccine. One dose is recommended after age 42. Talk to your health care provider about which screenings and vaccines you need and how often you need them. This information is not intended to replace advice given to you by your health care provider. Make sure you discuss any questions you have with your health care provider. Document Released: 07/13/2015 Document Revised: 03/05/2016 Document Reviewed: 04/17/2015 Elsevier Interactive Patient Education  2017 [place] Prevention in the Home Falls can cause injuries. They can happen to people of all ages. There are many things you can do to make your home safe and to help prevent falls. What can I do on the outside of my home? Regularly fix the edges of walkways and driveways and fix any cracks. Remove anything that might make you trip as you walk through a door, such as a raised step or threshold. Trim any bushes or trees on the path to your home. Use bright outdoor lighting. Clear any walking paths of anything that might make someone trip, such as rocks or tools. Regularly check to see if handrails are loose or broken. Make sure that both sides of any steps have handrails. Any raised decks and porches should have guardrails on the edges. Have any leaves, snow, or ice cleared regularly. Use sand or salt on walking paths during winter. Clean up any spills in your garage right away. This includes oil or grease spills. What can I do in the bathroom? Use night lights. Install grab bars by the toilet and in the tub and shower. Do not use towel bars as grab bars. Use non-skid mats or decals in the tub or shower. If you need to sit down in the shower, use a plastic, non-slip stool. Keep the floor dry. Clean up any water that spills on the floor as soon as it happens. Remove soap buildup in the tub or shower regularly. Attach bath mats securely with double-sided non-slip rug tape. Do not have throw rugs and other  things on the floor that can make you trip. What can I do in the bedroom? Use night lights. Make sure that you have a light by your bed that is easy to reach. Do not use any sheets or blankets that are too big for your bed. They should not hang down onto the floor. Have a firm chair that has side arms. You can use this for support while you get dressed. Do not have throw rugs and other things on the floor that can make you trip. What can I do in the kitchen? Clean up any spills right away. Avoid walking on wet floors. Keep items that you use a lot in easy-to-reach places. If you need to reach something above you, use a strong step stool that has a grab bar. Keep electrical cords out of the way. Do not use floor polish or wax that makes floors slippery. If you must use wax, use non-skid floor wax. Do not have throw rugs and other things on the floor that can make you trip. What can I do with my stairs? Do not leave any items on the stairs. Make sure that there are  handrails on both sides of the stairs and use them. Fix handrails that are broken or loose. Make sure that handrails are as long as the stairways. Check any carpeting to make sure that it is firmly attached to the stairs. Fix any carpet that is loose or worn. Avoid having throw rugs at the top or bottom of the stairs. If you do have throw rugs, attach them to the floor with carpet tape. Make sure that you have a light switch at the top of the stairs and the bottom of the stairs. If you do not have them, ask someone to add them for you. What else can I do to help prevent falls? Wear shoes that: Do not have high heels. Have rubber bottoms. Are comfortable and fit you well. Are closed at the toe. Do not wear sandals. If you use a stepladder: Make sure that it is fully opened. Do not climb a closed stepladder. Make sure that both sides of the stepladder are locked into place. Ask someone to hold it for you, if possible. Clearly  mark and make sure that you can see: Any grab bars or handrails. First and last steps. Where the edge of each step is. Use tools that help you move around (mobility aids) if they are needed. These include: Canes. Walkers. Scooters. Crutches. Turn on the lights when you go into a dark area. Replace any light bulbs as soon as they burn out. Set up your furniture so you have a clear path. Avoid moving your furniture around. If any of your floors are uneven, fix them. If there are any pets around you, be aware of where they are. Review your medicines with your doctor. Some medicines can make you feel dizzy. This can increase your chance of falling. Ask your doctor what other things that you can do to help prevent falls. This information is not intended to replace advice given to you by your health care provider. Make sure you discuss any questions you have with your health care provider. Document Released: 04/12/2009 Document Revised: 11/22/2015 Document Reviewed: 07/21/2014 Elsevier Interactive Patient Education  2017 [employer].

## 2022-04-24 NOTE — Progress Notes (Signed)
Subjective:   [name] is a 76 y.o. male who presents for Medicare Annual/Subsequent preventive examination.  Review of Systems    Defer to PCP Cardiac Risk Factors include: advanced age (>35mn, >>44women);dyslipidemia;hypertension;male gender     Objective:    Today's Vitals   04/24/22 0937 04/24/22 0939  BP: (!) 145/88   Pulse: 78   Weight: 214 lb 6.4 oz (97.3 kg)   Height: '5\' 11"'$  (1.803 m)   PainSc:  6    Body mass index is 29.9 kg/m.     04/24/2022    9:40 AM 04/22/2021    9:46 AM 12/04/2020    1:02 PM 11/01/2020   10:50 AM 10/24/2020    1:29 PM 04/26/2019    1:07 PM 05/24/2018    9:38 AM  Advanced Directives  Does Patient Have a Medical Advance Directive? Yes Yes Yes Yes Yes No Yes  Type of AParamedicof ANew [place]Living will [place] will  [place] will [place]Living will  [place] will  Does patient want to make changes to medical advance directive? No - Patient declined  No - Patient declined No - Patient declined   No - Patient declined  Copy of H[place]neiderin Chart? No - copy requested No - copy requested  No - copy requested No - copy requested  No - copy requested  Would patient like information on creating a medical advance directive?      No - Patient declined     Current Medications (verified) Outpatient Encounter Medications as of 04/24/2022  Medication Sig   acetaminophen (TYLENOL) 325 MG tablet Take 1-2 tablets (325-650 mg total) by mouth every 6 (six) hours as needed for mild pain (pain score 1-3 or temp > 100.5).   ALPRAZolam (XANAX) 1 MG tablet TAKE 1 TABLET (1 MG TOTAL) BY MOUTH AT BEDTIME AS NEEDED FOR ANXIETY.   amLODipine (NORVASC) 5 MG tablet Take 1 tablet (5 mg total) by mouth daily.   COVID-19 mRNA vaccine 2023-2024 (COMIRNATY) syringe Inject into the muscle.   hydrocortisone (ANUSOL-HC) 25 MG  suppository Unwrap and place 1 suppository (25 mg total) rectally 2 (two) times daily.   hyoscyamine (ANASPAZ) 0.125 MG TBDP disintergrating tablet Place 1 tablet (0.125 mg total) under the tongue every 4 (four) hours as needed for cramping.   influenza vaccine adjuvanted (FLUAD QUADRIVALENT) 0.5 ML injection Inject into the muscle.   losartan (COZAAR) 50 MG tablet TAKE 1 TABLET DAILY   meloxicam (MOBIC) 15 MG tablet Take 1 tablet (15 mg total) by mouth daily.   metoprolol succinate (TOPROL-XL) 50 MG 24 hr tablet Take 1 tablet (50 mg total) by mouth daily. Take with or immediately following a meal.   Multiple Vitamins-Minerals (MULTIVITAMIN ADULT PO) Take 2 tablets by mouth daily.   Pitavastatin Calcium (LIVALO) 2 MG TABS Take 1 tablet (2 mg total) by mouth daily.   predniSONE (DELTASONE) 5 MG tablet Take 6 pills for first day, 5 pills second day, 4 pills third day, 3 pills fourth day, 2 pills the fifth day, and 1 pill sixth day.   tamsulosin (FLOMAX) 0.4 MG CAPS capsule Take 0.4 mg by mouth daily.   trolamine salicylate (ASPERCREME) 10 % cream Apply 1 application topically as needed for muscle pain.   No facility-administered encounter medications on file as of 04/24/2022.    Allergies (verified) Patient has no known allergies.   History: Past Medical  History:  Diagnosis Date   Abnormal CT scan, gastrointestinal tract suspect angioedema    Anemia 03/16/2016   IRON   Anxiety    Arthritis    Back pain 10/10/2012   Fatty liver 2005   seen on 2005 ultrasound,05/2015 CT.    Hemorrhoids 08/22/2016   Hemorrhoids, internal, with bleeding 2005   internal and external. 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   Procedure: LEFT ANATOMIC SHOULDER REPLACEMENT;  Surgeon: Meredith Pel, MD;  Location: [facility];  Service: Orthopedics;  Laterality: Left;   VENTRAL HERNIA REPAIR N/A 06/03/2018   Procedure: LAPAROSCOPIC VENTRAL WALL  HERNIA  REPAIR WITH MESH  ERAS PATHWAY;  Surgeon: Michael Boston, MD;  Location: [facility];  Service: General;  Laterality: N/A;   Family History  Problem Relation Age of Onset   Stroke Mother    Aneurysm Mother        brain   Arthritis Father    Liver cancer Father        liver, atomic testing in [place]   Gallbladder disease Father    Hyperlipidemia Sister    Hypertension Sister    Obesity Sister    Diabetes Sister    Diabetes Brother    Hyperlipidemia Sister    Hypertension Sister    Obesity Sister    Diabetes Sister    Aneurysm Brother        brain   COPD Brother    Gallbladder disease Sister        x4   Colon cancer Neg Hx    Colon polyps Neg Hx    Esophageal cancer Neg Hx    Rectal cancer Neg Hx    Stomach cancer Neg Hx    Prostate cancer Neg Hx    CAD Neg Hx    Social History   Socioeconomic History   Marital status: Married    Spouse name: Not on file   Number of children: 1   Years of education: Not on file   Highest education level: Not on file  Occupational History   Occupation: Retired    Fish farm manager: [employer]  Tobacco Use    Smoking status: Never   Smokeless tobacco: Never  Vaping Use   Vaping Use: Never used  Substance and Sexual Activity   Alcohol use: Yes    Alcohol/week: 2.0 standard drinks of alcohol    Types: 2 Standard drinks or equivalent per week  Comment: Occassionally   Drug use: No   Sexual activity: Yes  Other Topics Concern   Not on file  Social History Narrative   Married - 1 child   Retired from [employer], no tbacco/drug use   Social Determinants of Health   Financial Resource Strain: Low Risk  (04/22/2021)   Overall Financial Resource Strain (CARDIA)    Difficulty of Paying Living Expenses: Not hard at all  Food Insecurity: No Food Insecurity (04/22/2021)   Hunger Vital Sign    Worried About Running Out of Food in the Last Year: Never true    Ran Out of Food in the Last Year: Never true  Transportation Needs: No Transportation Needs (04/22/2021)   PRAPARE - Hydrologist (Medical): No    Lack of Transportation (Non-Medical): No  Physical Activity: Sufficiently Active (04/22/2021)   Exercise Vital Sign    Days of Exercise per Week: 4 days    Minutes of Exercise per Session: 40 min  Stress: No Stress Concern Present (04/22/2021)   [place]    Feeling of Stress : Only a little  Social Connections: Socially Integrated (04/22/2021)   Social Connection and Isolation Panel [NHANES]    Frequency of Communication with Friends and Family: More than three times a week    Frequency of Social Gatherings with Friends and Family: More than three times a week    Attends Religious Services: More than 4 times per year    Active Member of [employer] or Organizations: Yes    Attends Music therapist: More than 4 times per year    Marital Status: Married    Tobacco Counseling Counseling given: Not Answered   Clinical Intake:  Pre-visit preparation completed: Yes  Pain :  0-10 Pain Score: 6  Pain Location: Knee Pain Orientation: Right Pain Descriptors / Indicators: Aching Pain Onset: 1 to 4 weeks ago Pain Frequency: Intermittent        How often do you need to have someone help you when you read instructions, pamphlets, or other written materials from your doctor or pharmacy?: 1 - Never  Diabetic? No  Activities of Daily Living    04/24/2022    9:42 AM  In your present state of health, do you have any difficulty performing the following activities:  Hearing? 0  Vision? 0  Difficulty concentrating or making decisions? 0  Walking or climbing stairs? 0  Dressing or bathing? 0  Doing errands, shopping? 0  Preparing Food and eating ? N  Using the Toilet? N  In the past six months, have you accidently leaked urine? N  Do you have problems with loss of bowel control? N  Managing your Medications? N  Managing your Finances? N  Housekeeping or managing your Housekeeping? N    Patient Care Team: Mosie Lukes, MD as PCP - General (Family Medicine) Michael Boston, MD as Consulting Physician (General Surgery) Melina Schools, MD as Consulting Physician (Orthopedic Surgery) Gatha Mayer, MD as Consulting Physician (Gastroenterology)  Indicate any recent Medical Services you may have received from other than [facility] providers in the past year (date may be approximate).     Assessment:   This is a routine wellness examination for [name].  Hearing/Vision screen No results found.  Dietary issues and exercise activities discussed: Current Exercise Habits: Home exercise routine, Type of exercise: Other - see comments;strength training/weights;walking (plays golf twice a week, stationary  bike), Time (Minutes): 60, Frequency (Times/Week): 3, Weekly Exercise (Minutes/Week): 180, Intensity: Moderate, Exercise limited by: orthopedic condition(s) (right knee pain)   Goals Addressed   None    Depression Screen    04/24/2022    9:41 AM 05/06/2021     8:02 AM 04/22/2021    9:50 AM 10/26/2020    1:01 PM 04/26/2019    1:09 PM 04/20/2018    3:25 PM 01/29/2018    7:51 AM  PHQ 2/9 Scores  PHQ - 2 Score 0 0 0 0 0 0 0    Fall Risk    04/24/2022    9:40 AM 05/06/2021    8:01 AM 04/22/2021    9:49 AM 10/26/2020    1:01 PM 04/26/2019    1:09 PM  [place] in the past year? 0 0 0 0 0  Number falls in past yr: 0 0 0 0 0  Injury with Fall? 0 0 0 0 0  Risk for fall due to : No Fall Risks      Follow up Falls evaluation completed  Falls prevention discussed      FALL RISK PREVENTION PERTAINING TO THE HOME:  Any stairs in or around the home? Yes  If so, are there any without handrails? No  Home free of loose throw rugs in walkways, pet beds, electrical cords, etc? Yes  Adequate lighting in your home to reduce risk of falls? Yes   ASSISTIVE DEVICES UTILIZED TO PREVENT FALLS:  Life alert? No  Use of a cane, walker or w/c? No  Grab bars in the bathroom? No  Shower chair or bench in shower?  Has chair but doesn't use it Elevated toilet seat or a handicapped toilet? Yes   TIMED UP AND GO:  Was the test performed? Yes .  Length of time to ambulate 10 feet: 7 sec.   Gait slow and steady without use of assistive device  Cognitive Function:    08/22/2016    8:24 AM  MMSE - Mini Mental State Exam  Orientation to time 5  Orientation to Place 5  Registration 3  Attention/ Calculation 5  Recall 3  Language- name 2 objects 2  Language- repeat 1  Language- follow 3 step command 3  Language- read & follow direction 1  Write a sentence 1  Copy design 1  Total score 30        04/24/2022    9:48 AM  6CIT Screen  What Year? 0 points  What month? 0 points  What time? 0 points  Count back from 20 0 points  Months in reverse 0 points  Repeat phrase 0 points  Total Score 0 points    Immunizations Immunization History  Administered Date(s) Administered   COVID-19, mRNA, vaccine(Comirnaty)12 years and older 04/16/2022    Fluad Quad(high Dose 65+) 03/15/2019, 04/03/2021, 03/31/2022   Influenza Whole 04/12/2013   Influenza, High Dose Seasonal PF 03/11/2016, 04/07/2017, 04/20/2018   Influenza,inj,Quad PF,6+ Mos 04/12/2015   Influenza-Unspecified 04/13/2014   PFIZER Comirnaty(Gray Top)Covid-19 Tri-Sucrose Vaccine 12/14/2020   PFIZER(Purple Top)SARS-COV-2 Vaccination 07/22/2019, 08/12/2019, 03/27/2020   Pfizer Covid-19 Vaccine Bivalent Booster 24yr & up 03/26/2021   Pneumococcal Conjugate-13 08/18/2013   Pneumococcal Polysaccharide-23 02/19/2016   Tdap 06/30/2010, 12/04/2020   Zoster Recombinat (Shingrix) 04/20/2018, 07/30/2018   Zoster, Live 06/30/2010    TDAP status: Up to date  Flu Vaccine status: Up to date  Pneumococcal vaccine status: Up to date  Covid-19 vaccine status: Information provided on  how to obtain vaccines.   Qualifies for Shingles Vaccine? Yes   Zostavax completed No   Shingrix Completed?: Yes  Screening Tests Health Maintenance  Topic Date Due   COVID-19 Vaccine (6 - Pfizer risk series) 05/21/2021   Medicare Annual Wellness (AWV)  05/22/2022   TETANUS/TDAP  12/05/2030   Pneumonia Vaccine 79+ Years old  Completed   INFLUENZA VACCINE  Completed   Hepatitis C Screening  Completed   Zoster Vaccines- Shingrix  Completed   HPV VACCINES  Aged Out   COLONOSCOPY (Pts 45-19yr Insurance coverage will need to be confirmed)  DSunsetMaintenance Due  Topic Date Due   COVID-19 Vaccine (6 - PDeslogerisk series) 05/21/2021   Medicare Annual Wellness (AWV)  05/22/2022    Colorectal cancer screening: Type of screening: Colonoscopy. Completed 07/31/21. Repeat every 10 years  Lung Cancer Screening: (Low Dose CT Chest recommended if Age 74-80years, 30 pack-year currently smoking OR have quit w/in 15years.) does not qualify.   Lung Cancer Screening Referral: N/a  Additional Screening:  Hepatitis C Screening: does qualify; Completed 02/19/16  Vision  Screening: Recommended annual ophthalmology exams for early detection of glaucoma and other disorders of the eye. Is the patient up to date with their annual eye exam?  Yes  Who is the provider or what is the name of the office in which the patient attends annual eye exams? Dr. HLaban EmperorIf pt is not established with a provider, would they like to be referred to a provider to establish care? No .   Dental Screening: Recommended annual dental exams for proper oral hygiene  Community Resource Referral / Chronic Care Management: CRR required this visit?  No   CCM required this visit?  No      Plan:     I have personally reviewed and noted the following in the patient's chart:   Medical and social history Use of alcohol, tobacco or illicit drugs  Current medications and supplements including opioid prescriptions. Patient is not currently taking opioid prescriptions. Functional ability and status Nutritional status Physical activity Advanced directives List of other physicians Hospitalizations, surgeries, and ER visits in previous 12 months Vitals Screenings to include cognitive, depression, and falls Referrals and appointments  In addition, I have reviewed and discussed with patient certain preventive protocols, quality metrics, and best practice recommendations. A written personalized care plan for preventive services as well as general preventive health recommendations were provided to patient.     BBeatris Ship [place]  04/24/2022   Nurse Notes: None

## 2022-04-30 ENCOUNTER — Ambulatory Visit (INDEPENDENT_AMBULATORY_CARE_PROVIDER_SITE_OTHER): Payer: Medicare Other | Admitting: Family

## 2022-04-30 VITALS — BP 150/96 | HR 73 | Temp 97.8°F | Resp 16 | Wt 213.0 lb

## 2022-04-30 DIAGNOSIS — E785 Hyperlipidemia, unspecified: Secondary | ICD-10-CM

## 2022-04-30 DIAGNOSIS — I10 Essential (primary) hypertension: Secondary | ICD-10-CM

## 2022-04-30 LAB — COMPREHENSIVE METABOLIC PANEL
ALT: 26 U/L (ref 0–53)
AST: 16 U/L (ref 0–37)
Albumin: 4.2 g/dL (ref 3.5–5.2)
Alkaline Phosphatase: 92 U/L (ref 39–117)
BUN: 15 mg/dL (ref 6–23)
CO2: 32 mEq/L (ref 19–32)
Calcium: 9.6 mg/dL (ref 8.4–10.5)
Chloride: 104 mEq/L (ref 96–112)
Creatinine, Ser: 1.08 mg/dL (ref 0.40–1.50)
GFR: 67.76 mL/min (ref >60.00)
Glucose, Bld: 98 mg/dL (ref 70–99)
Potassium: 4.5 mEq/L (ref 3.5–5.1)
Sodium: 140 mEq/L (ref 135–145)
Total Bilirubin: 0.6 mg/dL (ref 0.2–1.2)
Total Protein: 7.1 g/dL (ref 6.0–8.3)

## 2022-04-30 LAB — LIPID PANEL
Cholesterol: 147 mg/dL (ref 0–200)
HDL: 48.8 mg/dL (ref >39.00)
LDL Cholesterol: 76 mg/dL (ref 0–99)
NonHDL: 98.2
Total CHOL/HDL Ratio: 3
Triglycerides: 113 mg/dL (ref 0.0–149.0)
VLDL: 22.6 mg/dL (ref 0.0–40.0)

## 2022-04-30 MED ORDER — AMLODIPINE BESYLATE 10 MG PO TABS: 10.0000 mg | ORAL_TABLET | Freq: Every day | ORAL | 0 refills | Status: DC

## 2022-04-30 NOTE — Assessment & Plan Note (Signed)
Uncontrolled. Will increase amlodipine from 5mg to 10mg.  

## 2022-04-30 NOTE — Progress Notes (Signed)
Subjective:     Patient ID: [name], male    DOB: 1945/10/16, 76 y.o.   MRN: [id number]  Chief Complaint  Patient presents with   Hypertension    Here for follow up   Knee Pain    Patient complains of right knee pain, seeing sports medicine    HPI Patient is in today for follow up.  Hyperlipidemia- maintained on livalo. Lab Results  Component Value Date   CHOL 141 05/06/2021   HDL 40.00 05/06/2021   LDLCALC 85 05/06/2021   TRIG 84.0 05/06/2021   CHOLHDL 4 05/06/2021     HTN- maintained on toprol xl, amlodipine, losartan.  BP Readings from Last 3 Encounters:  04/30/22 (!) 150/96  04/24/22 (!) 145/88  04/22/22 118/78   Right knee pain- had a steroid injection.  Still having a lot of pain.    There are no preventive care reminders to display for this patient.  Past Medical History:  Diagnosis Date   Abnormal CT scan, gastrointestinal tract suspect angioedema    Anemia 03/16/2016   IRON   Anxiety    Arthritis    Back pain 10/10/2012   Fatty liver 2005   seen on 2005 ultrasound,05/2015 CT.    Hemorrhoids 08/22/2016   Hemorrhoids, internal, with bleeding 2005   internal and external. 2005 band ligation (dr Ferdinand Lango in [facility])   Hyperglycemia 08/21/2013   Hyperlipidemia    Hypertension    Nocturia 08/21/2013   Obesity (BMI 30.0-34.9)    Osteoporosis    Pneumonia    as a child   Rectal bleeding 07/30/2014   Renal cyst    SBO (small bowel obstruction) ([place])    Tubular adenoma of colon before 2005, 2012, 08/2014   Unspecified constipation 02/06/2014   Vitamin D deficiency     Past Surgical History:  Procedure Laterality Date   COLONOSCOPY  before 2005, 2012, 2013, 08/2014.    ENTEROSCOPY N/A 10/05/2017   Procedure: ENTEROSCOPY;  Surgeon: Gatha Mayer, MD;  Location: [facility];  Service: Endoscopy;  Laterality: N/A;   FRACTURE SURGERY Left 1989   leg   HEMORRHOID BANDING  2005   IR RADIOLOGIST EVAL & MGMT  11/11/2021   KNEE SURGERY Left 2010    arthroscopy, torn carilage   KNEE SURGERY Right 2012   arthroscopy   TONSILLECTOMY  1957   TONSILLECTOMY     TOTAL SHOULDER ARTHROPLASTY Left 11/01/2020   Procedure: LEFT ANATOMIC SHOULDER REPLACEMENT;  Surgeon: Meredith Pel, MD;  Location: [place];  Service: Orthopedics;  Laterality: Left;   VENTRAL HERNIA REPAIR N/A 06/03/2018   Procedure: LAPAROSCOPIC VENTRAL WALL  HERNIA  REPAIR WITH MESH  ERAS PATHWAY;  Surgeon: Michael Boston, MD;  Location: [facility];  Service: General;  Laterality: N/A;    Family History  Problem Relation Age of Onset   Stroke Mother    Aneurysm Mother        brain   Arthritis Father    Liver cancer Father        liver, atomic testing in [place]   Gallbladder disease Father    Hyperlipidemia Sister    Hypertension Sister    Obesity Sister    Diabetes Sister    Diabetes Brother    Hyperlipidemia Sister    Hypertension Sister    Obesity Sister    Diabetes Sister    Aneurysm Brother        brain   COPD Brother    Gallbladder disease Sister  x4   Colon cancer Neg Hx    Colon polyps Neg Hx    Esophageal cancer Neg Hx    Rectal cancer Neg Hx    Stomach cancer Neg Hx    Prostate cancer Neg Hx    CAD Neg Hx     Social History   Socioeconomic History   Marital status: Married    Spouse name: Not on file   Number of children: 1   Years of education: Not on file   Highest education level: Not on file  Occupational History   Occupation: Retired    Fish farm manager: [employer]  Tobacco Use   Smoking status: Never   Smokeless tobacco: Never  Vaping Use   Vaping Use: Never used  Substance and Sexual Activity   Alcohol use: Yes    Alcohol/week: 2.0 standard drinks of alcohol    Types: 2 Standard drinks or equivalent per week    Comment: Occassionally   Drug use: No   Sexual activity: Yes  Other Topics Concern   Not on file  Social History Narrative   Married - 1 child   Retired from [employer], no tbacco/drug use    Social Determinants of Health   Financial Resource Strain: [place]  (04/22/2021)   Overall Financial Resource Strain (CARDIA)    Difficulty of Paying Living Expenses: Not hard at all  Food Insecurity: No Food Insecurity (04/22/2021)   Hunger Vital Sign    Worried About Running Out of Food in the Last Year: Never true    [place] in the Last Year: Never true  Transportation Needs: No Transportation Needs (04/22/2021)   PRAPARE - Hydrologist (Medical): No    Lack of Transportation (Non-Medical): No  Physical Activity: Sufficiently Active (04/22/2021)   Exercise Vital Sign    Days of Exercise per Week: 4 days    Minutes of Exercise per Session: 40 min  Stress: No Stress Concern Present (04/22/2021)   [place]    Feeling of Stress : Only a little  Social Connections: Socially Integrated (04/22/2021)   Social Connection and Isolation Panel [NHANES]    Frequency of Communication with Friends and Family: More than three times a week    Frequency of Social Gatherings with Friends and Family: More than three times a week    Attends Religious Services: More than 4 times per year    Active Member of [employer] or Organizations: Yes    Attends Music therapist: More than 4 times per year    Marital Status: Married  Human resources officer Violence: Not At Risk (04/22/2021)   Humiliation, Afraid, Rape, and Kick questionnaire    Fear of Current or Ex-Partner: No    Emotionally Abused: No    Physically Abused: No    Sexually Abused: No    Outpatient Medications Prior to Visit  Medication Sig Dispense Refill   acetaminophen (TYLENOL) 325 MG tablet Take 1-2 tablets (325-650 mg total) by mouth every 6 (six) hours as needed for mild pain (pain score 1-3 or temp > 100.5). 100 tablet 0   ALPRAZolam (XANAX) 1 MG tablet TAKE 1 TABLET (1 MG TOTAL) BY MOUTH AT BEDTIME AS NEEDED FOR ANXIETY. 30  tablet 0   COVID-19 mRNA vaccine 2023-2024 (COMIRNATY) syringe Inject into the muscle. 0.3 mL 0   hydrocortisone (ANUSOL-HC) 25 MG suppository Unwrap and place 1 suppository (25 mg total)  rectally 2 (two) times daily. 12 suppository 1   hyoscyamine (ANASPAZ) 0.125 MG TBDP disintergrating tablet Place 1 tablet (0.125 mg total) under the tongue every 4 (four) hours as needed for cramping. 30 tablet 2   influenza vaccine adjuvanted (FLUAD QUADRIVALENT) 0.5 ML injection Inject into the muscle. 0.5 mL 0   losartan (COZAAR) 50 MG tablet TAKE 1 TABLET DAILY 90 tablet 0   meloxicam (MOBIC) 15 MG tablet Take 1 tablet (15 mg total) by mouth daily. 45 tablet 1   metoprolol succinate (TOPROL-XL) 50 MG 24 hr tablet Take 1 tablet (50 mg total) by mouth daily. Take with or immediately following a meal. 90 tablet 0   Multiple Vitamins-Minerals (MULTIVITAMIN ADULT PO) Take 2 tablets by mouth daily.     Pitavastatin Calcium (LIVALO) 2 MG TABS Take 1 tablet (2 mg total) by mouth daily. 90 tablet 0   tamsulosin (FLOMAX) 0.4 MG CAPS capsule Take 0.4 mg by mouth daily.     trolamine salicylate (ASPERCREME) 10 % cream Apply 1 application topically as needed for muscle pain.     amLODipine (NORVASC) 5 MG tablet Take 1 tablet (5 mg total) by mouth daily. 90 tablet 1   predniSONE (DELTASONE) 5 MG tablet Take 6 pills for first day, 5 pills second day, 4 pills third day, 3 pills fourth day, 2 pills the fifth day, and 1 pill sixth day. 21 tablet 0   No facility-administered medications prior to visit.    No Known Allergies  ROS  See HPI     Objective:    Physical Exam Constitutional:      General: He is not in acute distress.    Appearance: He is well-developed.  HENT:     Head: Normocephalic and atraumatic.  Cardiovascular:     Rate and Rhythm: Normal rate and regular rhythm.     Heart sounds: No murmur heard. Pulmonary:     Effort: Pulmonary effort is normal. No respiratory distress.     Breath sounds:  Normal breath sounds. No wheezing or rales.  Skin:    General: Skin is warm and dry.  Neurological:     Mental Status: He is alert and oriented to person, place, and time.  Psychiatric:        Behavior: Behavior normal.        Thought Content: Thought content normal.     BP (!) 150/96 (BP Location: Right Arm, Patient Position: Sitting, Cuff Size: Large)   Pulse 73   Temp 97.8 F (36.6 C) (Oral)   Resp 16   Wt 213 lb (96.6 kg)   SpO2 100%   BMI 29.71 kg/m  Wt Readings from Last 3 Encounters:  04/30/22 213 lb (96.6 kg)  04/24/22 214 lb 6.4 oz (97.3 kg)  04/22/22 215 lb (97.5 kg)       Assessment & Plan:   Problem List Items Addressed This Visit       Unprioritized   Hypertension, essential     Uncontrolled. Will increase amlodipine from 37m to 123m       Relevant Medications   amLODipine (NORVASC) 10 MG tablet   Hyperlipidemia - Primary    Tolerating livalo.  Will update lipid panel.       Relevant Medications   amLODipine (NORVASC) 10 MG tablet   Other Relevant Orders   Comp Met (CMET)   Lipid panel    I have discontinued Justus Stayer's amLODipine and predniSONE. I am also having him start on amLODipine. Additionally,  I am having him maintain his Multiple Vitamins-Minerals (MULTIVITAMIN ADULT PO), trolamine salicylate, acetaminophen, hyoscyamine, hydrocortisone, tamsulosin, losartan, Livalo, metoprolol succinate, ALPRAZolam, Fluad Quadrivalent, Comirnaty, and meloxicam.  Meds ordered this encounter  Medications   amLODipine (NORVASC) 10 MG tablet    Sig: Take 1 tablet (10 mg total) by mouth daily.    Dispense:  90 tablet    Refill:  0    Order Specific Question:   Supervising Provider    Answer:   Penni Homans A [[id number]]

## 2022-04-30 NOTE — Assessment & Plan Note (Addendum)
Tolerating livalo.  Will update lipid panel.

## 2022-04-30 NOTE — Patient Instructions (Signed)
Please increase amlodipine to '10mg'$  once daily.

## 2022-05-06 ENCOUNTER — Telehealth: Payer: Self-pay | Admitting: Family Medicine

## 2022-05-06 NOTE — Telephone Encounter (Signed)
Medication:  1.losartan (COZAAR) 50 MG tablet  2.metoprolol succinate (TOPROL-XL) 50 MG 24 hr tablet    Has the patient contacted their pharmacy? Yes.   Patient had OV with Melissa on 11/01, patient states he has not received his refills yet.   Preferred Pharmacy (with phone number or street name):  [facility], [place] [address], [place] [address] Phone: [phone]  Fax: [phone]

## 2022-05-08 ENCOUNTER — Other Ambulatory Visit: Payer: Self-pay

## 2022-05-08 MED ORDER — METOPROLOL SUCCINATE ER 50 MG PO TB24: 50.0000 mg | ORAL_TABLET | Freq: Every day | ORAL | 1 refills | Status: DC

## 2022-05-08 MED ORDER — LOSARTAN POTASSIUM 50 MG PO TABS: 50.0000 mg | ORAL_TABLET | Freq: Every day | ORAL | 1 refills | Status: DC

## 2022-05-08 NOTE — Telephone Encounter (Signed)
Refills sent

## 2022-05-13 ENCOUNTER — Telehealth: Payer: Self-pay

## 2022-05-13 ENCOUNTER — Ambulatory Visit (INDEPENDENT_AMBULATORY_CARE_PROVIDER_SITE_OTHER): Payer: Medicare Other | Admitting: Orthopaedic Surgery

## 2022-05-13 DIAGNOSIS — M1711 Unilateral primary osteoarthritis, right knee: Secondary | ICD-10-CM

## 2022-05-13 MED ORDER — TRAMADOL HCL 50 MG PO TABS: 50.0000 mg | ORAL_TABLET | Freq: Every day | ORAL | 0 refills | Status: DC | PRN

## 2022-05-13 NOTE — Progress Notes (Signed)
Office Visit Note   Patient: [name]           Date of Birth: 03/28/1946           MRN: [id number] Visit Date: 05/13/2022              Requested by: Mosie Lukes, MD [address] [place],  [place] [address] PCP: Mosie Lukes, MD   Assessment & Plan: Visit Diagnoses:  1. Primary osteoarthritis of right knee     Plan: Impression is advanced osteoarthritis of the right knee.  Medial compartment is bone-on-bone.  X-rays were reviewed with the patient and treatment options were reviewed.  He is interested in viscosupplementation.  We will get authorization for him.  I did notice that he is an established patient with Dr. Marlou Sa therefore we will have him follow-up with Dr. Marlou Sa for the Visco injection and further discussion of total knee replacement  This patient is diagnosed with osteoarthritis of the knee(s).    Radiographs show evidence of joint space narrowing, osteophytes, subchondral sclerosis and/or subchondral cysts.  This patient has knee pain which interferes with functional and activities of daily living.    This patient has experienced inadequate response, adverse effects and/or intolerance with conservative treatments such as acetaminophen, NSAIDS, topical creams, physical therapy or regular exercise, knee bracing and/or weight loss.   This patient has experienced inadequate response or has a contraindication to intra articular steroid injections for at least 3 months.   This patient is not scheduled to have a total knee replacement within 6 months of starting treatment with viscosupplementation.  Follow-Up Instructions: No follow-ups on file.   Orders:  No orders of the defined types were placed in this encounter.  Meds ordered this encounter  Medications   traMADol (ULTRAM) 50 MG tablet    Sig: Take 1-2 tablets (50-100 mg total) by mouth daily as needed.    Dispense:  20 tablet    Refill:  0      Procedures: No procedures  performed   Clinical Data: No additional findings.   Subjective: Chief Complaint  Patient presents with   Right Knee - Pain    HPI [name] is a very pleasant 76 year old gentleman here for right knee pain.  He has been going to [facility] medicine for his knee problem.  He has tried Zilretta injections and meloxicam and he is also had an aspiration.  Unfortunately the second Zilretta injection was not effective.  He has pain throughout the knee.  Pain wakes him at night.  This causes him to limp.  Review of Systems  Constitutional: Negative.   All other systems reviewed and are negative.    Objective: Vital Signs: There were no vitals taken for this visit.  Physical Exam Vitals and nursing note reviewed.  Constitutional:      Appearance: He is well-developed.  Pulmonary:     Effort: Pulmonary effort is normal.  Abdominal:     Palpations: Abdomen is soft.  Skin:    General: Skin is warm.  Neurological:     Mental Status: He is alert and oriented to person, place, and time.  Psychiatric:        Behavior: Behavior normal.        Thought Content: Thought content normal.        Judgment: Judgment normal.     Ortho Exam Examination of the right knee shows small joint effusion.  Medial joint line tenderness.  Collaterals and cruciates are stable.  Painful range of motion.  No signs of infection. Specialty Comments:  No specialty comments available.  Imaging: No results found.   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   Skin tags, multiple acquired 07/07/2019   LUQ pain 07/07/2019   OA (osteoarthritis) of knee 03/16/2019   Ventral hernia s/p lap repair with mesh 06/03/2018 06/03/2018   Hemorrhoids 08/22/2016   Anemia 03/16/2016   Right shoulder pain 02/26/2016   Abnormal CT scan, gastrointestinal tract suspect angioedema    Abdominal pain    Hyperglycemia  08/21/2013   Low back pain 10/10/2012   Hyperlipidemia    Hypertension, essential     Obesity (BMI 30.0-34.9)    Past Medical History:  Diagnosis Date   Abnormal CT scan, gastrointestinal tract suspect angioedema    Anemia 03/16/2016   IRON   Anxiety    Arthritis    Back pain 10/10/2012   Fatty liver 2005   seen on 2005 ultrasound,05/2015 CT.    Hemorrhoids 08/22/2016   Hemorrhoids, internal, with bleeding 2005   internal and external. 2005 band ligation (dr Ferdinand Lango in [facility])   Hyperglycemia 08/21/2013   Hyperlipidemia    Hypertension    Nocturia 08/21/2013   Obesity (BMI 30.0-34.9)    Osteoporosis    Pneumonia    as a child   Rectal bleeding 07/30/2014   Renal cyst    SBO (small bowel obstruction) ([facility])    Tubular adenoma of colon before 2005, 2012, 08/2014   Unspecified constipation 02/06/2014   Vitamin D deficiency     Family History  Problem Relation Age of Onset   Stroke Mother    Aneurysm Mother        brain   Arthritis Father    Liver cancer Father        liver, atomic testing in [place]   Gallbladder disease Father    Hyperlipidemia Sister    Hypertension Sister    Obesity Sister    Diabetes Sister    Diabetes Brother    Hyperlipidemia Sister    Hypertension Sister    Obesity Sister    Diabetes Sister    Aneurysm Brother        brain   COPD Brother    Gallbladder disease Sister        x4   Colon cancer Neg Hx    Colon polyps Neg Hx    Esophageal cancer Neg Hx    Rectal cancer Neg Hx    Stomach cancer Neg Hx    Prostate cancer Neg Hx    CAD Neg Hx     Past Surgical History:  Procedure Laterality Date   COLONOSCOPY  before 2005, 2012, 2013, 08/2014.    ENTEROSCOPY N/A 10/05/2017   Procedure: ENTEROSCOPY;  Surgeon: Gatha Mayer, MD;  [facility];  Service: Endoscopy;  Laterality: N/A;   FRACTURE SURGERY Left 1989   leg   HEMORRHOID BANDING  2005   IR RADIOLOGIST EVAL & MGMT  11/11/2021   KNEE SURGERY Left 2010   arthroscopy, torn  carilage   KNEE SURGERY Right 2012   arthroscopy   TONSILLECTOMY  1957   TONSILLECTOMY     TOTAL SHOULDER ARTHROPLASTY Left 11/01/2020   Procedure: LEFT ANATOMIC SHOULDER REPLACEMENT;  Surgeon: Meredith Pel, MD;  Location: [place];  Service: Orthopedics;  Laterality: Left;   VENTRAL HERNIA  REPAIR N/A 06/03/2018   Procedure: LAPAROSCOPIC VENTRAL WALL  HERNIA  REPAIR WITH MESH  ERAS PATHWAY;  Surgeon: Michael Boston, MD;  [facility];  Service: General;  Laterality: N/A;   Social History   Occupational History   Occupation: Retired    Fish farm manager: [employer]  Tobacco Use   Smoking status: Never   Smokeless tobacco: Never  Vaping Use   Vaping Use: Never used  Substance and Sexual Activity   Alcohol use: Yes    Alcohol/week: 2.0 standard drinks of alcohol    Types: 2 Standard drinks or equivalent per week    Comment: Occassionally   Drug use: No   Sexual activity: Yes

## 2022-05-13 NOTE — Telephone Encounter (Signed)
Right knee visco injection approval. Dr.Xu's patient. Thanks!

## 2022-05-14 ENCOUNTER — Ambulatory Visit (INDEPENDENT_AMBULATORY_CARE_PROVIDER_SITE_OTHER): Payer: Medicare Other | Admitting: Family

## 2022-05-14 ENCOUNTER — Encounter: Payer: Self-pay | Admitting: Family

## 2022-05-14 VITALS — BP 129/86 | HR 78 | Temp 97.8°F | Ht 71.0 in | Wt 217.4 lb

## 2022-05-14 DIAGNOSIS — I10 Essential (primary) hypertension: Secondary | ICD-10-CM

## 2022-05-14 DIAGNOSIS — M1712 Unilateral primary osteoarthritis, left knee: Secondary | ICD-10-CM | POA: Diagnosis not present

## 2022-05-14 NOTE — Assessment & Plan Note (Signed)
BP Readings from Last 3 Encounters:  05/14/22 129/86  04/30/22 (!) 150/96  04/24/22 (!) 145/88   Improved on increased dose of amlodipine. Continue amlodipine at current dose, toprol xl and losartan.

## 2022-05-14 NOTE — Assessment & Plan Note (Signed)
Reports significant pain right knee. He will undergo gel shots- following with ortho.

## 2022-05-14 NOTE — Progress Notes (Signed)
Subjective:   By signing my name below, I, Shehryar Baig, attest that this documentation has been prepared under the direction and in the presence of Debbrah Alar, NP. 05/14/2022    Patient ID: [name], male    DOB: 14-Oct-1945, 76 y.o.   MRN: [id number]  Chief Complaint  Patient presents with   Follow-up    Blood pressure meds    HPI Patient is in today for a follow up visit.   Blood pressure- His blood pressure was elevated during last visit so his amlodipine was increased from 5 to 10 mg. His blood pressure is doing well during this visit. He continues taking 10 mg amlodipine, 50 mg losartan, 50 mg metoprolol succinate and reports no new issues while taking it.  BP Readings from Last 3 Encounters:  05/14/22 129/86  04/30/22 (!) 150/96  04/24/22 (!) 145/88   Pulse Readings from Last 3 Encounters:  05/14/22 78  04/30/22 73  04/24/22 78    Past Medical History:  Diagnosis Date   Abnormal CT scan, gastrointestinal tract suspect angioedema    Anemia 03/16/2016   IRON   Anxiety    Arthritis    Back pain 10/10/2012   Fatty liver 2005   seen on 2005 ultrasound,05/2015 CT.    Hemorrhoids 08/22/2016   Hemorrhoids, internal, with bleeding 2005   internal and external. 2005 band ligation (dr Ferdinand Lango in [facility])   Hyperglycemia 08/21/2013   Hyperlipidemia    Hypertension    Nocturia 08/21/2013   Obesity (BMI 30.0-34.9)    Osteoporosis    Pneumonia    as a child   Rectal bleeding 07/30/2014   Renal cyst    SBO (small bowel obstruction) ([place])    Tubular adenoma of colon before 2005, 2012, 08/2014   Unspecified constipation 02/06/2014   Vitamin D deficiency     Past Surgical History:  Procedure Laterality Date   COLONOSCOPY  before 2005, 2012, 2013, 08/2014.    ENTEROSCOPY N/A 10/05/2017   Procedure: ENTEROSCOPY;  Surgeon: Gatha Mayer, MD;  Location: [facility];  Service: Endoscopy;  Laterality: N/A;   FRACTURE SURGERY Left 1989   leg   HEMORRHOID BANDING   2005   IR RADIOLOGIST EVAL & MGMT  11/11/2021   KNEE SURGERY Left 2010   arthroscopy, torn carilage   KNEE SURGERY Right 2012   arthroscopy   TONSILLECTOMY  1957   TONSILLECTOMY     TOTAL SHOULDER ARTHROPLASTY Left 11/01/2020   Procedure: LEFT ANATOMIC SHOULDER REPLACEMENT;  Surgeon: Meredith Pel, MD;  Location: [facility];  Service: Orthopedics;  Laterality: Left;   VENTRAL HERNIA REPAIR N/A 06/03/2018   Procedure: LAPAROSCOPIC VENTRAL WALL  HERNIA  REPAIR WITH MESH  ERAS PATHWAY;  Surgeon: Michael Boston, MD;  Location: [facility];  Service: General;  Laterality: N/A;    Family History  Problem Relation Age of Onset   Stroke Mother    Aneurysm Mother        brain   Arthritis Father    Liver cancer Father        liver, atomic testing in [place]   Gallbladder disease Father    Hyperlipidemia Sister    Hypertension Sister    Obesity Sister    Diabetes Sister    Diabetes Brother    Hyperlipidemia Sister    Hypertension Sister    Obesity Sister    Diabetes Sister    Aneurysm Brother        brain   COPD Brother  Gallbladder disease Sister        x4   Colon cancer Neg Hx    Colon polyps Neg Hx    Esophageal cancer Neg Hx    Rectal cancer Neg Hx    Stomach cancer Neg Hx    Prostate cancer Neg Hx    CAD Neg Hx     Social History   Socioeconomic History   Marital status: Married    Spouse name: Not on file   Number of children: 1   Years of education: Not on file   Highest education level: Not on file  Occupational History   Occupation: Retired    Fish farm manager: [employer]  Tobacco Use   Smoking status: Never   Smokeless tobacco: Never  Vaping Use   Vaping Use: Never used  Substance and Sexual Activity   Alcohol use: Yes    Alcohol/week: 2.0 standard drinks of alcohol    Types: 2 Standard drinks or equivalent per week    Comment: Occassionally   Drug use: No   Sexual activity: Yes  Other Topics Concern   Not on file  Social History Narrative   Married -  1 child   Retired from [employer], no tbacco/drug use   Social Determinants of Health   Financial Resource Strain: [place]  (04/22/2021)   Overall Financial Resource Strain (CARDIA)    Difficulty of Paying Living Expenses: Not hard at all  Food Insecurity: No Food Insecurity (04/22/2021)   Hunger Vital Sign    Worried About Running Out of Food in the Last Year: Never true    Bad [place] in the Last Year: Never true  Transportation Needs: No Transportation Needs (04/22/2021)   PRAPARE - Hydrologist (Medical): No    Lack of Transportation (Non-Medical): No  Physical Activity: Sufficiently Active (04/22/2021)   Exercise Vital Sign    Days of Exercise per Week: 4 days    Minutes of Exercise per Session: 40 min  Stress: No Stress Concern Present (04/22/2021)   [place]    Feeling of Stress : Only a little  Social Connections: Socially Integrated (04/22/2021)   Social Connection and Isolation Panel [NHANES]    Frequency of Communication with Friends and Family: More than three times a week    Frequency of Social Gatherings with Friends and Family: More than three times a week    Attends Religious Services: More than 4 times per year    Active Member of [employer] or Organizations: Yes    Attends Music therapist: More than 4 times per year    Marital Status: Married  Human resources officer Violence: Not At Risk (04/22/2021)   Humiliation, Afraid, Rape, and Kick questionnaire    Fear of Current or Ex-Partner: No    Emotionally Abused: No    Physically Abused: No    Sexually Abused: No    Outpatient Medications Prior to Visit  Medication Sig Dispense Refill   ALPRAZolam (XANAX) 1 MG tablet TAKE 1 TABLET (1 MG TOTAL) BY MOUTH AT BEDTIME AS NEEDED FOR ANXIETY. 30 tablet 0   amLODipine (NORVASC) 10 MG tablet Take 1 tablet (10 mg total) by mouth daily. 90 tablet 0    COVID-19 mRNA vaccine 2023-2024 (COMIRNATY) syringe Inject into the muscle. 0.3 mL 0   hydrocortisone (ANUSOL-HC) 25 MG suppository Unwrap and place 1 suppository (25 mg total) rectally 2 (two) times daily.  12 suppository 1   hyoscyamine (ANASPAZ) 0.125 MG TBDP disintergrating tablet Place 1 tablet (0.125 mg total) under the tongue every 4 (four) hours as needed for cramping. 30 tablet 2   influenza vaccine adjuvanted (FLUAD QUADRIVALENT) 0.5 ML injection Inject into the muscle. 0.5 mL 0   losartan (COZAAR) 50 MG tablet Take 1 tablet (50 mg total) by mouth daily. 90 tablet 1   meloxicam (MOBIC) 15 MG tablet Take 1 tablet (15 mg total) by mouth daily. 45 tablet 1   metoprolol succinate (TOPROL-XL) 50 MG 24 hr tablet Take 1 tablet (50 mg total) by mouth daily. Take with or immediately following a meal. 90 tablet 1   Multiple Vitamins-Minerals (MULTIVITAMIN ADULT PO) Take 2 tablets by mouth daily.     Pitavastatin Calcium (LIVALO) 2 MG TABS Take 1 tablet (2 mg total) by mouth daily. 90 tablet 0   tamsulosin (FLOMAX) 0.4 MG CAPS capsule Take 0.4 mg by mouth daily.     traMADol (ULTRAM) 50 MG tablet Take 1-2 tablets (50-100 mg total) by mouth daily as needed. 20 tablet 0   trolamine salicylate (ASPERCREME) 10 % cream Apply 1 application topically as needed for muscle pain.     acetaminophen (TYLENOL) 325 MG tablet Take 1-2 tablets (325-650 mg total) by mouth every 6 (six) hours as needed for mild pain (pain score 1-3 or temp > 100.5). 100 tablet 0   No facility-administered medications prior to visit.    No Known Allergies  ROS     Objective:    Physical Exam Constitutional:      General: He is not in acute distress.    Appearance: Normal appearance. He is not ill-appearing.  HENT:     Head: Normocephalic and atraumatic.     Right Ear: External ear normal.     Left Ear: External ear normal.  Eyes:     Extraocular Movements: Extraocular movements intact.     Pupils: Pupils are equal,  round, and reactive to light.  Cardiovascular:     Rate and Rhythm: Normal rate and regular rhythm.     Heart sounds: Normal heart sounds. No murmur heard.    No gallop.  Pulmonary:     Effort: Pulmonary effort is normal. No respiratory distress.     Breath sounds: Normal breath sounds. No wheezing or rales.  Skin:    General: Skin is warm and dry.  Neurological:     Mental Status: He is alert and oriented to person, place, and time.  Psychiatric:        Judgment: Judgment normal.     BP 129/86 (BP Location: Right Arm, Patient Position: Sitting, Cuff Size: Large)   Pulse 78   Temp 97.8 F (36.6 C) (Oral)   Ht '5\' 11"'$  (1.803 m)   Wt 217 lb 6.4 oz (98.6 kg)   SpO2 100%   BMI 30.32 kg/m  Wt Readings from Last 3 Encounters:  05/14/22 217 lb 6.4 oz (98.6 kg)  04/30/22 213 lb (96.6 kg)  04/24/22 214 lb 6.4 oz (97.3 kg)       Assessment & Plan:   Problem List Items Addressed This Visit       Unprioritized   OA (osteoarthritis) of knee    Reports significant pain right knee. He will undergo gel shots- following with ortho.       Hypertension, essential  - Primary    BP Readings from Last 3 Encounters:  05/14/22 129/86  04/30/22 (!) 150/96  04/24/22 Marland Kitchen)  145/88  Improved on increased dose of amlodipine. Continue amlodipine at current dose, toprol xl and losartan.          No orders of the defined types were placed in this encounter.   I, Nance Pear, NP, personally preformed the services described in this documentation.  All medical record entries made by the scribe were at my direction and in my presence.  I have reviewed the chart and discharge instructions (if applicable) and agree that the record reflects my personal performance and is accurate and complete. 05/14/2022   I,Shehryar Baig,acting as a Education administrator for Nance Pear, NP.,have documented all relevant documentation on the behalf of Nance Pear, NP,as directed by  Nance Pear,  NP while in the presence of Nance Pear, NP.   Nance Pear, NP

## 2022-05-15 NOTE — Telephone Encounter (Signed)
VOB submitted for Monovisc, right knee. 

## 2022-05-26 ENCOUNTER — Ambulatory Visit (INDEPENDENT_AMBULATORY_CARE_PROVIDER_SITE_OTHER): Payer: Medicare Other

## 2022-05-26 ENCOUNTER — Ambulatory Visit: Payer: Medicare Other

## 2022-05-26 ENCOUNTER — Ambulatory Visit (INDEPENDENT_AMBULATORY_CARE_PROVIDER_SITE_OTHER): Payer: Medicare Other | Admitting: Surgical

## 2022-05-26 ENCOUNTER — Encounter: Payer: Self-pay | Admitting: Orthopedic Surgery

## 2022-05-26 DIAGNOSIS — G8929 Other chronic pain: Secondary | ICD-10-CM | POA: Diagnosis not present

## 2022-05-26 DIAGNOSIS — M19011 Primary osteoarthritis, right shoulder: Secondary | ICD-10-CM | POA: Diagnosis not present

## 2022-05-26 DIAGNOSIS — M79671 Pain in right foot: Secondary | ICD-10-CM

## 2022-05-26 DIAGNOSIS — M25511 Pain in right shoulder: Secondary | ICD-10-CM

## 2022-05-26 DIAGNOSIS — M1711 Unilateral primary osteoarthritis, right knee: Secondary | ICD-10-CM | POA: Diagnosis not present

## 2022-05-26 NOTE — Progress Notes (Signed)
Office Visit Note   Patient: [name]           Date of Birth: 1945-12-05           MRN: [id number] Visit Date: 05/26/2022 Requested by: Mosie Lukes, MD [address] [place],  [place] [address] PCP: Mosie Lukes, MD  Subjective: Chief Complaint  Patient presents with   Right Knee - Follow-up    Monovisc    HPI: [name] is a 76 y.o. male who presents to the office reporting right knee pain.  States that his knee is giving him significant discomfort and wakes him up from sleep at night.  Limits his ability to walk and play golf.  Has not been able to play golf in about 3 months.  He has had prior cortisone injections and Zilretta injections.  Last Zilretta injection about 6 months ago did not really provide much relief but still help more than the cortisone injections do.  He last saw Dr. Erlinda Hong 2 weeks ago and has been approved for Monovisc injection today.  No groin pain.  Does note some medial sided right foot pain that has been present over the last 3 weeks.  He has increased pain upon standing that dissipates as he walks more in the foot.  Most of his knee pain is localized to the medial and lateral aspects of the knee.  He does note swelling.  Left shoulder is overall doing well with some anterior soreness at the distal aspect of the incision that occasionally bothers him at night but does not limit his activities.  Overall he is very satisfied with how his left shoulder is doing..    In addition to the right knee pain, he complains of right shoulder pain and stiffness that he has noticed over the last year.  He had prior injection about a year ago with good relief.  No history of prior surgery to the shoulder.  No recent injury.  Pain will wake at night and he describes it as an aching pain.  No radicular pain, neck pain, numbness/tingling.  Localizes most of the pain to the anterior and lateral aspects of the right shoulder.  Also has occasional mid humeral  pain.  Also complains of right foot pain with pain that he localizes primarily to the medial aspect of the hindfoot.  This has become more prominent over the last few weeks and makes it difficult for him to ambulate at times.  No injury to the right foot.  Denies any history of surgery.              ROS: All systems reviewed are negative as they relate to the chief complaint within the history of present illness.  Patient denies fevers or chills.  Assessment & Plan: Visit Diagnoses:  1. Primary osteoarthritis of right knee   2. Pain in right foot     Plan: Patient is a 76 year old male who presents for evaluation of multiple joint complaints.  He has right knee pain for which he is approved for Monovisc injection.  Monovisc injection was administered today and he tolerated the procedure well.  Will see how this does for him.  He also complains of right shoulder pain.  Left shoulder is doing well following shoulder replacement surgery that he had before.  Right shoulder radiographs were taken today demonstrating severe end-stage right glenohumeral osteoarthritis.  After discussion of options, he would like to try right shoulder injection.  Glenohumeral  injection administered under ultrasound guidance.  He also notes right foot pain is primarily in the medial aspect of the hindfoot.  He has reproduction of pain with single-leg heel raise and some increased left foot deformity compared with the contralateral foot.  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  Posterior tibialis tendon boot was provided for him today and he will wear this whenever he is ambulatory.  Follow-up in 3 weeks for clinical recheck mostly regarding the right foot and if he is still having symptoms, anticipate referral to Dr. Sharol Given for further evaluation and management.  Also we can assess how his right shoulder and knee are feeling at that point.  Follow-Up Instructions: No follow-ups on file.   Orders:   Orders Placed This Encounter  Procedures   XR Foot Complete Right   Ambulatory referral to Orthopedic Surgery   No orders of the defined types were placed in this encounter.     Procedures: Large Joint Inj: R glenohumeral on 05/26/2022 2:36 PM Indications: diagnostic evaluation and pain Details: 22 G 1.5 in and 3.5 in needle, ultrasound-guided posterior approach  Arthrogram: No  Medications: 9 mL bupivacaine 0.5 %; 40 mg methylPREDNISolone acetate 40 MG/ML; 5 mL lidocaine 1 % Outcome: tolerated well, no immediate complications Procedure, treatment alternatives, risks and benefits explained, specific risks discussed. Consent was given by the patient. Immediately prior to procedure a time out was called to verify the correct patient, procedure, equipment, support staff and site/side marked as required. Patient was prepped and draped in the usual sterile fashion.    Large Joint Inj: R knee on 05/26/2022 2:40 PM Indications: pain, joint swelling and diagnostic evaluation Details: 18 G 1.5 in needle, superolateral approach  Arthrogram: No  Medications: 5 mL lidocaine 1 %; 88 mg Hyaluronan 88 MG/4ML Outcome: tolerated well, no immediate complications Procedure, treatment alternatives, risks and benefits explained, specific risks discussed. Consent was given by the patient. Immediately prior to procedure a time out was called to verify the correct patient, procedure, equipment, support staff and site/side marked as required. Patient was prepped and draped in the usual sterile fashion.       Clinical Data: No additional findings.  Objective: Vital Signs: There were no vitals taken for this visit.  Physical Exam:  Constitutional: Patient appears well-developed HEENT:  Head: Normocephalic Eyes:EOM are normal Neck: Normal range of motion Cardiovascular: Normal rate Pulmonary/chest: Effort normal Neurologic: Patient is alert Skin: Skin is warm Psychiatric: Patient has normal  mood and affect  Ortho Exam: Ortho exam demonstrates right knee with 5 degree flexion contracture.  120 degrees of knee flexion.  No calf tenderness.  Negative Homans' sign.  Excellent quad strength rated 5/5.  Tenderness over the medial and lateral joint lines.  No cellulitis or skin changes noted.  No pain with hip range of motion.  Right shoulder with 50 degrees external rotation, 50 degrees abduction, 110 degrees forward flexion.  Axillary nerve is intact with deltoid firing.  Excellent rotator cuff strength of supra, infra, subscap.  +5 motor strength of bilateral grip strength, finger abduction, pronation/supination, bicep, tricep, deltoid.  Right foot with intact ankle dorsiflexion, plantarflexion, inversion, eversion.  Palpable DP pulse.  There is some increased flatfoot deformity noted of the right foot relative to the left.  Has tenderness along the insertion of the posterior tibialis tendon.  Reproduction of pain with single-leg heel raise though he is able to form this.  Specialty Comments:  No specialty comments available.  Imaging: No results found.  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   Skin tags, multiple acquired 07/07/2019   LUQ pain 07/07/2019   OA (osteoarthritis) of knee 03/16/2019   Ventral hernia s/p lap repair with mesh 06/03/2018 06/03/2018   Hemorrhoids 08/22/2016   Anemia 03/16/2016   Right shoulder pain 02/26/2016   Abnormal CT scan, gastrointestinal tract suspect angioedema    Abdominal pain    Hyperglycemia 08/21/2013   Low back pain 10/10/2012   Hyperlipidemia    Hypertension, essential     Obesity (BMI 30.0-34.9)    Past Medical History:  Diagnosis Date   Abnormal CT scan, gastrointestinal tract suspect angioedema    Anemia 03/16/2016   IRON   Anxiety    Arthritis    Back pain 10/10/2012   Fatty liver 2005   seen on 2005 ultrasound,05/2015 CT.     Hemorrhoids 08/22/2016   Hemorrhoids, internal, with bleeding 2005   internal and external. 2005 band ligation (dr Ferdinand Lango in [facility])   Hyperglycemia 08/21/2013   Hyperlipidemia    Hypertension    Nocturia 08/21/2013   Obesity (BMI 30.0-34.9)    Osteoporosis    Pneumonia    as a child   Rectal bleeding 07/30/2014   Renal cyst    SBO (small bowel obstruction) ([facility])    Tubular adenoma of colon before 2005, 2012, 08/2014   Unspecified constipation 02/06/2014   Vitamin D deficiency     Family History  Problem Relation Age of Onset   Stroke Mother    Aneurysm Mother        brain   Arthritis Father    Liver cancer Father        liver, atomic testing in [place]   Gallbladder disease Father    Hyperlipidemia Sister    Hypertension Sister    Obesity Sister    Diabetes Sister    Diabetes Brother    Hyperlipidemia Sister    Hypertension Sister    Obesity Sister    Diabetes Sister    Aneurysm Brother        brain   COPD Brother    Gallbladder disease Sister        x4   Colon cancer Neg Hx    Colon polyps Neg Hx    Esophageal cancer Neg Hx    Rectal cancer Neg Hx    Stomach cancer Neg Hx    Prostate cancer Neg Hx    CAD Neg Hx     Past Surgical History:  Procedure Laterality Date   COLONOSCOPY  before 2005, 2012, 2013, 08/2014.    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   Procedure: LEFT ANATOMIC SHOULDER REPLACEMENT;  Surgeon: Meredith Pel, MD;  Location: [facility];  Service: Orthopedics;  Laterality: Left;   VENTRAL HERNIA REPAIR N/A 06/03/2018   Procedure: LAPAROSCOPIC VENTRAL WALL   HERNIA  REPAIR WITH MESH  ERAS PATHWAY;  Surgeon: Michael Boston, MD;  Location[facility];  Service:  General;  Laterality: N/A;   Social History   Occupational History   Occupation: Retired    Fish farm manager: [employer]  Tobacco Use   Smoking status: Never   Smokeless tobacco: Never  Vaping Use   Vaping Use: Never used  Substance and Sexual Activity   Alcohol use: Yes    Alcohol/week: 2.0 standard drinks of alcohol    Types: 2 Standard drinks or equivalent per week    Comment: Occassionally   Drug use: No   Sexual activity: Yes

## 2022-06-01 ENCOUNTER — Encounter: Payer: Self-pay | Admitting: Orthopedic Surgery

## 2022-06-01 MED ORDER — LIDOCAINE HCL 1 % IJ SOLN
5.0000 mL | INTRAMUSCULAR | Status: AC | PRN
  Administered 2022-05-26: 5 mL

## 2022-06-01 MED ORDER — HYALURONAN 88 MG/4ML IX SOSY
88.0000 mg | PREFILLED_SYRINGE | INTRA_ARTICULAR | Status: AC | PRN
  Administered 2022-05-26: 88 mg via INTRA_ARTICULAR

## 2022-06-01 MED ORDER — METHYLPREDNISOLONE ACETATE 40 MG/ML IJ SUSP
40.0000 mg | INTRAMUSCULAR | Status: AC | PRN
  Administered 2022-05-26: 40 mg via INTRA_ARTICULAR

## 2022-06-01 MED ORDER — BUPIVACAINE HCL 0.5 % IJ SOLN
9.0000 mL | INTRAMUSCULAR | Status: AC | PRN
  Administered 2022-05-26: 9 mL via INTRA_ARTICULAR

## 2022-06-18 ENCOUNTER — Ambulatory Visit (INDEPENDENT_AMBULATORY_CARE_PROVIDER_SITE_OTHER): Payer: Medicare Other | Admitting: Orthopedic Surgery

## 2022-06-18 DIAGNOSIS — M1711 Unilateral primary osteoarthritis, right knee: Secondary | ICD-10-CM

## 2022-06-18 MED ORDER — TRAMADOL HCL 50 MG PO TABS: ORAL_TABLET | ORAL | 0 refills | Status: DC

## 2022-06-19 ENCOUNTER — Encounter: Payer: Self-pay | Admitting: Orthopedic Surgery

## 2022-06-19 NOTE — Progress Notes (Signed)
Office Visit Note   Patient: [name]           Date of Birth: 03-Feb-1946           MRN: [id number] Visit Date: 06/18/2022 Requested by: Mosie Lukes, MD [address] [place],  [place] [address] PCP: Mosie Lukes, MD  Subjective: Chief Complaint  Patient presents with   Right Knee - Follow-up   Right Shoulder - Follow-up    HPI: [name] is a 76 y.o. male who presents to the office reporting right knee pain.  Patient had right knee injection which was a gel injection 05/26/2022 which did not give him much improvement.  He also had a right glenohumeral joint injection 05/26/2022 which gave him significant improvement.  He reports relatively constant pain in the right knee with ambulation as well as at rest.  To 2 think like pain.  Has been particularly bad for the last 5 months.  He has tried cortisone shot along with gel shot and Zilretta shot.  He has no personal or family history of DVT or pulmonary embolism.  His wife lives with him at home.  Has 13 stairs at home.  No cardiac history no diabetes.  He states that currently he really cannot live with the knee pain the way it is.  He describes decreased walking endurance..                ROS: All systems reviewed are negative as they relate to the chief complaint within the history of present illness.  Patient denies fevers or chills.  Assessment & Plan: Visit Diagnoses:  1. Primary osteoarthritis of right knee     Plan: Impression is end-stage right knee arthritis with failure of conservative management.  Does have severe arthritis particularly in the medial compartment but realistically in all 3 compartments.  Plan is right total knee replacement.  The risk and benefits are discussed with the patient including not limited to infection nerve and vessel damage incomplete pain restoration as well as potential for revision.  Bone quality looks good so I think we should be able to do a press-fit prosthesis.  We  will aspirate the right knee today and try him on Ultram.  Patient understands risk and benefits of knee replacement.  Aim for that in January.  All questions answered  Follow-Up Instructions: No follow-ups on file.   Orders:  No orders of the defined types were placed in this encounter.  Meds ordered this encounter  Medications   traMADol (ULTRAM) 50 MG tablet    Sig: 1 po bid prn pain    Dispense:  36 tablet    Refill:  0      Procedures: Large Joint Inj: R knee on 06/18/2022 9:00 AM Indications: diagnostic evaluation, joint swelling and pain Details: 18 G 1.5 in needle, superolateral approach  Arthrogram: No  Medications: 5 mL lidocaine 1 % Aspirate: 30 mL serous Outcome: tolerated well, no immediate complications Procedure, treatment alternatives, risks and benefits explained, specific risks discussed. Consent was given by the patient. Immediately prior to procedure a time out was called to verify the correct patient, procedure, equipment, support staff and site/side marked as required. Patient was prepped and draped in the usual sterile fashion.       Clinical Data: No additional findings.  Objective: Vital Signs: There were no vitals taken for this visit.  Physical Exam:  Constitutional: Patient appears well-developed HEENT:  Head: Normocephalic Eyes:EOM are  normal Neck: Normal range of motion Cardiovascular: Normal rate Pulmonary/chest: Effort normal Neurologic: Patient is alert Skin: Skin is warm Psychiatric: Patient has normal mood and affect  Ortho Exam: Ortho exam demonstrates slightly antalgic gait to the right with no groin pain with internal or external rotation of the right leg.  Pedal pulses palpable.  Slight varus alignment present.  Extensor mechanism intact.  Has range of motion of about 5-1 15.  Has medial greater than lateral joint line tenderness but patellofemoral crepitus is present bilaterally.  Moderate effusion is present in the  knee.  Specialty Comments:  No specialty comments available.  Imaging: No results found.   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   Skin tags, multiple acquired 07/07/2019   LUQ pain 07/07/2019   OA (osteoarthritis) of knee 03/16/2019   Ventral hernia s/p lap repair with mesh 06/03/2018 06/03/2018   Hemorrhoids 08/22/2016   Anemia 03/16/2016   Right shoulder pain 02/26/2016   Abnormal CT scan, gastrointestinal tract suspect angioedema    Abdominal pain    Hyperglycemia 08/21/2013   Low back pain 10/10/2012   Hyperlipidemia    Hypertension, essential     Obesity (BMI 30.0-34.9)    Past Medical History:  Diagnosis Date   Abnormal CT scan, gastrointestinal tract suspect angioedema    Anemia 03/16/2016   IRON   Anxiety    Arthritis    Back pain 10/10/2012   Fatty liver 2005   seen on 2005 ultrasound,05/2015 CT.    Hemorrhoids 08/22/2016   Hemorrhoids, internal, with bleeding 2005   internal and external. 2005 band ligation (dr Ferdinand Lango in [facility])   Hyperglycemia 08/21/2013   Hyperlipidemia    Hypertension    Nocturia 08/21/2013   Obesity (BMI 30.0-34.9)    Osteoporosis    Pneumonia    as a child   Rectal bleeding 07/30/2014   Renal cyst    SBO (small bowel obstruction) (HCC)    Tubular adenoma of colon before 2005, 2012, 08/2014   Unspecified constipation 02/06/2014   Vitamin D deficiency     Family History  Problem Relation Age of Onset   Stroke Mother    Aneurysm Mother        brain   Arthritis Father    Liver cancer Father        liver, atomic testing in [place]   Gallbladder disease Father    Hyperlipidemia Sister    Hypertension Sister    Obesity Sister    Diabetes Sister    Diabetes Brother    Hyperlipidemia Sister    Hypertension Sister    Obesity Sister    Diabetes Sister    Aneurysm Brother        brain   COPD Brother    Gallbladder disease Sister         x4   Colon cancer Neg Hx    Colon polyps Neg Hx    Esophageal cancer Neg Hx    Rectal cancer Neg Hx    Stomach cancer Neg Hx    Prostate cancer Neg Hx    CAD Neg Hx     Past Surgical History:  Procedure Laterality Date   COLONOSCOPY  before 2005, 2012, 2013, 08/2014.    ENTEROSCOPY N/A 10/05/2017   Procedure: ENTEROSCOPY;  Surgeon: Gatha Mayer, MD;  Location: [facility];  Service: Endoscopy;  Laterality: N/A;   FRACTURE SURGERY Left  1989   leg   HEMORRHOID BANDING  2005   IR RADIOLOGIST EVAL & MGMT  11/11/2021   KNEE SURGERY Left 2010   arthroscopy, torn carilage   KNEE SURGERY Right 2012   arthroscopy   TONSILLECTOMY  1957   TONSILLECTOMY     TOTAL SHOULDER ARTHROPLASTY Left 11/01/2020   Procedure: LEFT ANATOMIC SHOULDER REPLACEMENT;  Surgeon: Meredith Pel, MD;  Location: [facility];  Service: Orthopedics;  Laterality: Left;   VENTRAL HERNIA REPAIR N/A 06/03/2018   Procedure: LAPAROSCOPIC VENTRAL WALL  HERNIA  REPAIR WITH MESH  ERAS PATHWAY;  Surgeon: Michael Boston, MD;  Location: [facility];  Service: General;  Laterality: N/A;   Social History   Occupational History   Occupation: Retired    Fish farm manager: [employer]  Tobacco Use   Smoking status: Never   Smokeless tobacco: Never  Vaping Use   Vaping Use: Never used  Substance and Sexual Activity   Alcohol use: Yes    Alcohol/week: 2.0 standard drinks of alcohol    Types: 2 Standard drinks or equivalent per week    Comment: Occassionally   Drug use: No   Sexual activity: Yes

## 2022-06-22 MED ORDER — LIDOCAINE HCL 1 % IJ SOLN
5.0000 mL | INTRAMUSCULAR | Status: AC | PRN
  Administered 2022-06-18: 5 mL

## 2022-06-26 IMAGING — DX DG RIBS W/ CHEST 3+V*L*
3 series · 3 of 3 positions shown · non-contrast
Comparison: 01/23/2017

CLINICAL DATA: Left lower rib pain, intermittent for years

EXAM:
LEFT RIBS AND CHEST - 3+ VIEW

[chest pa]
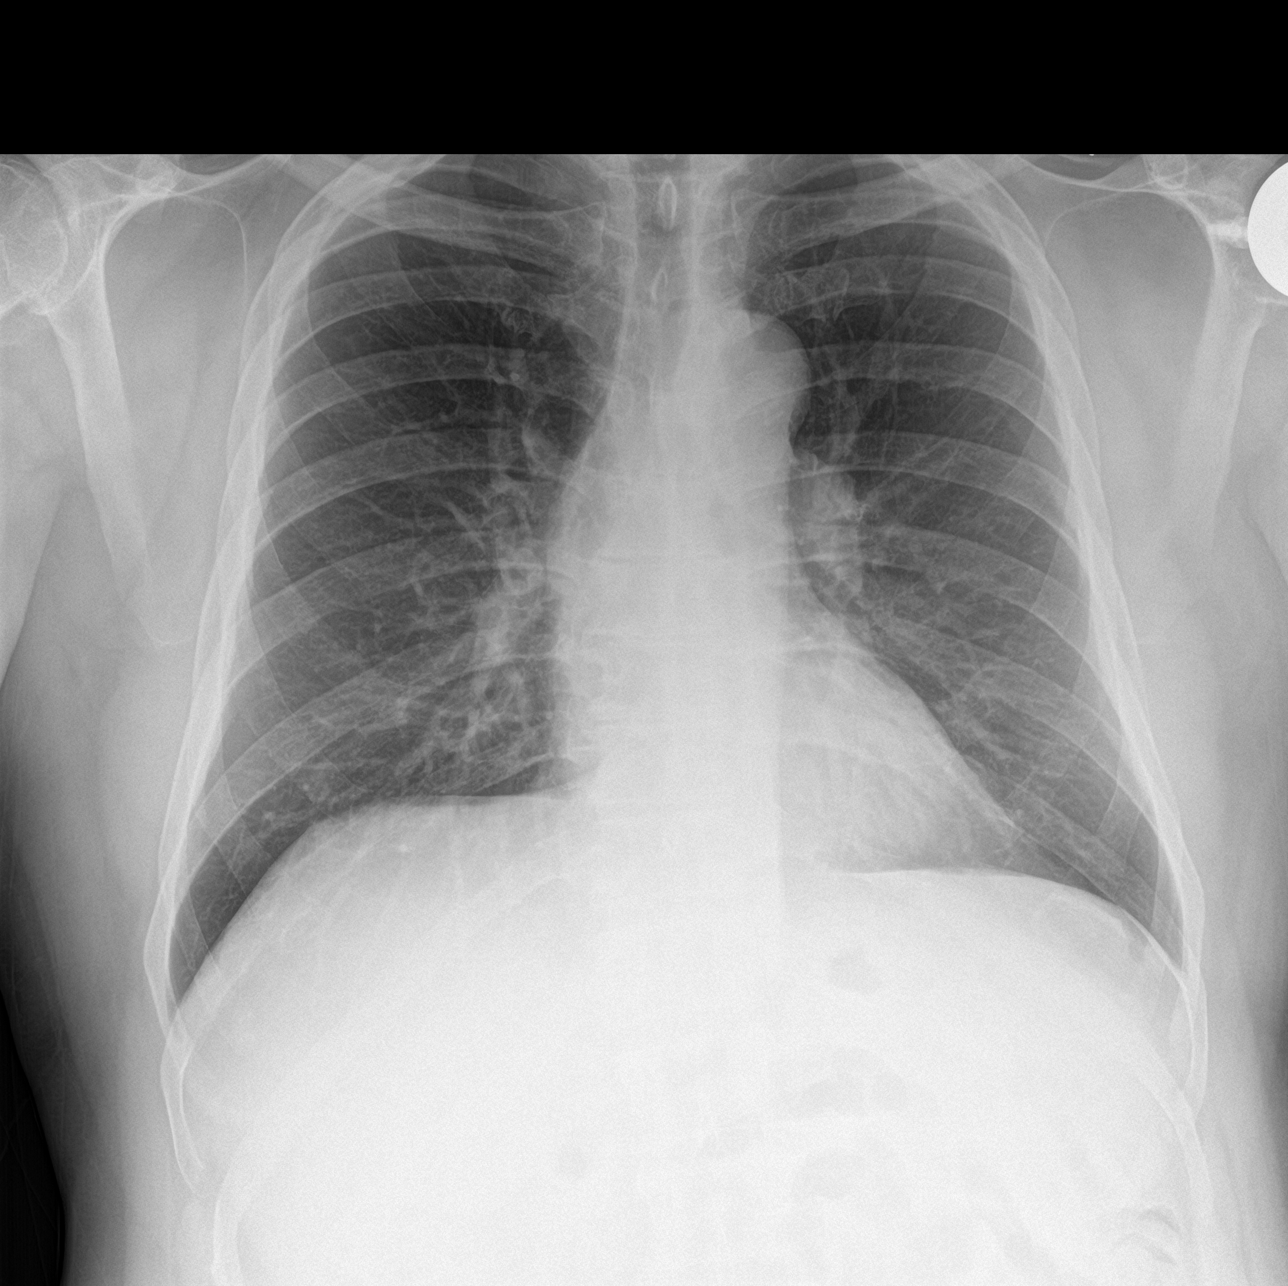

[rib pa]
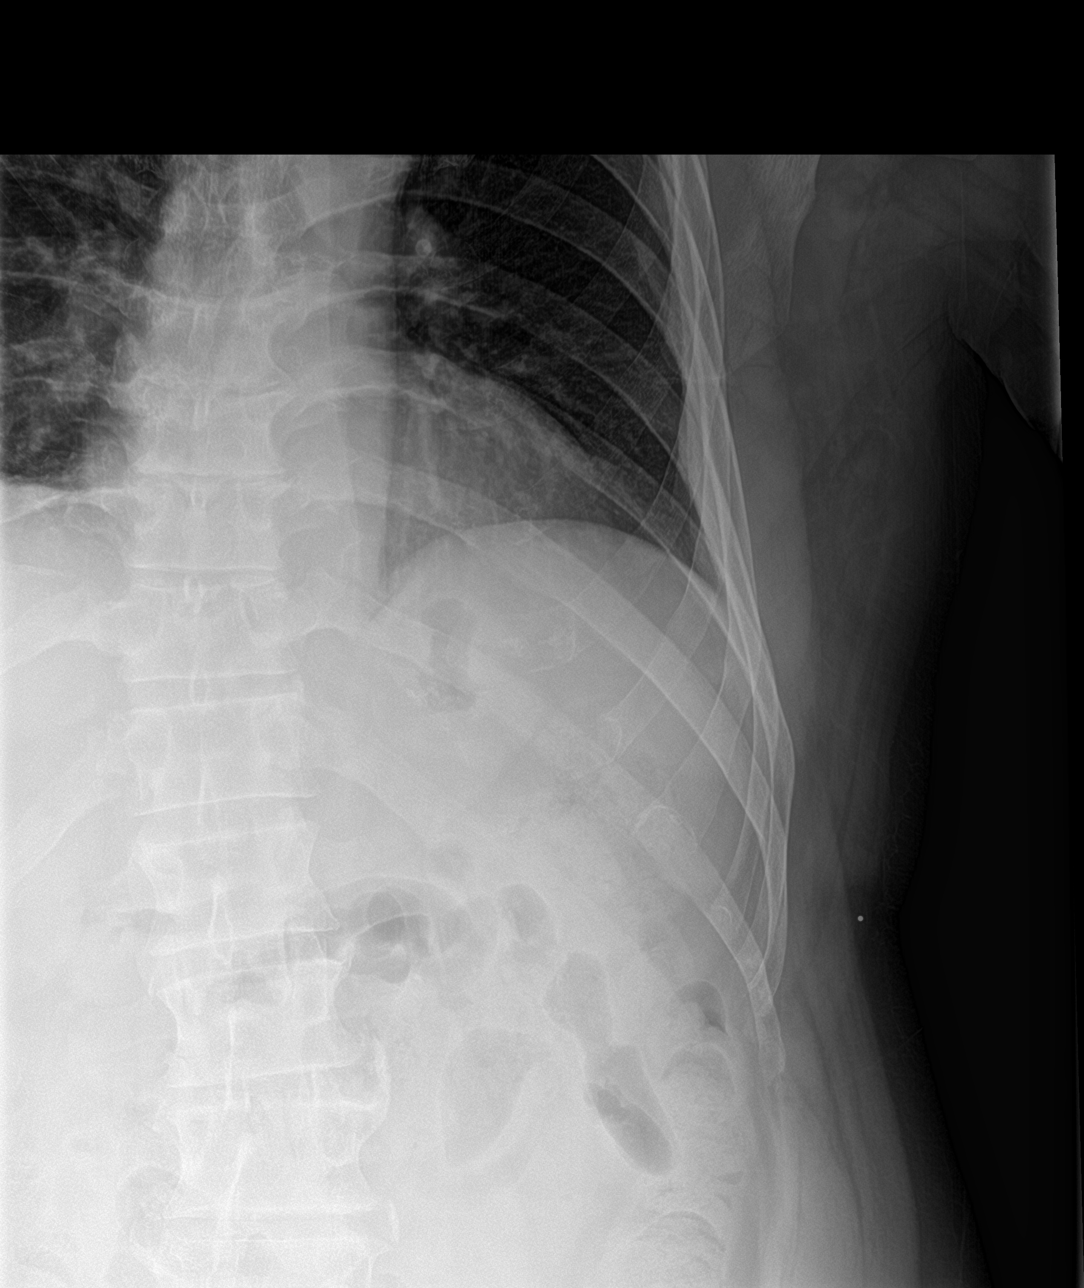

[rib pa obl]
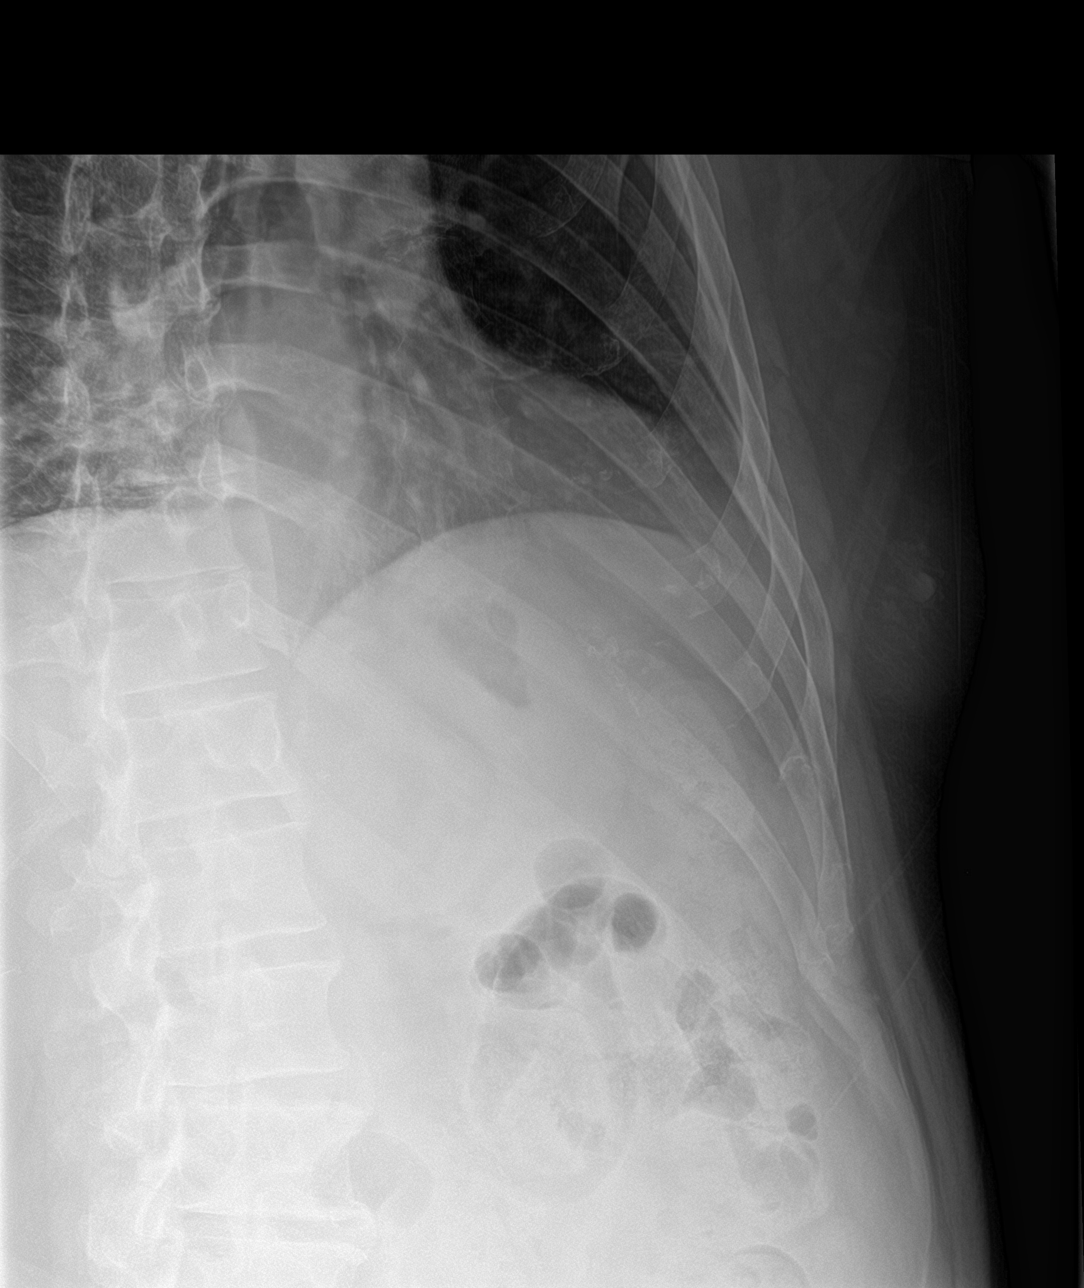

[3 of 3 positions shown; findings below may reference images not displayed]

FINDINGS: No fracture or other bone lesions are seen involving the ribs. There
is no evidence of pneumothorax or pleural effusion. Both lungs are
clear. Heart size and mediastinal contours are within normal limits.
IMPRESSION: No displaced fracture or other radiographic abnormality of the left
ribs to explain pain.

## 2022-07-10 ENCOUNTER — Other Ambulatory Visit: Payer: Self-pay

## 2022-07-11 ENCOUNTER — Other Ambulatory Visit: Payer: Self-pay | Admitting: Family

## 2022-07-16 ENCOUNTER — Other Ambulatory Visit: Payer: Self-pay | Admitting: Family Medicine

## 2022-07-18 NOTE — Progress Notes (Signed)
Surgical Instructions    Your procedure is scheduled on Thursday, July 24, 2022.  Report to [facility] Main Entrance "A" at 5:30 A.M., then check in with the Admitting office.  Call this number if you have problems the morning of surgery:  [phone]   If you have any questions prior to your surgery date call [phone]: Open Monday-Friday 8am-4pm If you experience any cold or flu symptoms such as cough, fever, chills, shortness of breath, etc. between now and your scheduled surgery, please notify us at the above number     Remember:  Do not eat after midnight the night before your surgery  You may drink clear liquids until 4:30 A.M the morning of your surgery.   Clear liquids allowed are: Water, Non-Citrus Juices (without pulp), Carbonated Beverages, Clear Tea, Black Coffee ONLY (NO MILK, CREAM OR POWDERED CREAMER of any kind), and Gatorade   Enhanced Recovery after Surgery for Orthopedics Enhanced Recovery after Surgery is a protocol used to improve the stress on your body and your recovery after surgery.  Patient Instructions  The day of surgery:  Drink ONE (1) Pre-Surgery Clear Ensure by 4:30 A.M the morning of surgery   This drink was given to you during your hospital  pre-op appointment visit. Nothing else to drink after completing the  Pre-Surgery Clear Ensure.    Take these medicines the morning of surgery with A SIP OF WATER:   metoprolol succinate  amLODipine (NORVASC)  tamsulosin (FLOMAX)   If Needed:  acetaminophen (TYLENOL)  ALPRAZolam (XANAX)  traMADol (ULTRAM)   As of today, STOP taking any Aspirin (unless otherwise instructed by your surgeon) Aleve, Naproxen, Ibuprofen, Motrin, Advil, Goody's, BC's, all herbal medications, fish oil, all vitamins, and meloxicam (MOBIC) and diclofenac Sodium (VOLTAREN) GEL.    SURGICAL WAITING ROOM VISITATION Patients having surgery or a procedure may have no more than 2 support people in the waiting area - these  visitors may rotate.   Children under the age of 42 must have an adult with them who is not the patient. If the patient needs to stay at the hospital during part of their recovery, the visitor guidelines for inpatient rooms apply. Pre-op nurse will coordinate an appropriate time for 1 support person to accompany patient in pre-op.  This support person may not rotate.   Please refer to [email or web address] for the visitor guidelines for Inpatients (after your surgery is over and you are in a regular room).    Special instructions:    Oral Hygiene is also important to reduce your risk of infection.  Remember - BRUSH YOUR TEETH THE MORNING OF SURGERY WITH YOUR REGULAR TOOTHPASTE   [facility]- Preparing For Surgery  Before surgery, you can play an important role. Because skin is not sterile, your skin needs to be as free of germs as possible. You can reduce the number of germs on your skin by washing with CHG (chlorahexidine gluconate) Soap before surgery.  CHG is an antiseptic cleaner which kills germs and bonds with the skin to continue killing germs even after washing.     Please do not use if you have an allergy to CHG or antibacterial soaps. If your skin becomes reddened/irritated stop using the CHG.  Do not shave (including legs and underarms) for at least 48 hours prior to first CHG shower. It is OK to shave your face.  Please follow these instructions carefully.     Shower the NIGHT BEFORE SURGERY and the MORNING OF SURGERY with CHG  Soap.   If you chose to wash your hair, wash your hair first as usual with your normal shampoo. After you shampoo, rinse your hair and body thoroughly to remove the shampoo.  Then [employer] and genitals (private parts) with your normal soap and rinse thoroughly to remove soap.  After that Use CHG Soap as you would any other liquid soap. You can apply CHG directly to the skin and wash gently with a scrungie  or a clean washcloth.   Apply the CHG Soap to your body ONLY FROM THE NECK DOWN.  Do not use on open wounds or open sores. Avoid contact with your eyes, ears, mouth and genitals (private parts). Wash Face and genitals (private parts)  with your normal soap.   Wash thoroughly, paying special attention to the area where your surgery will be performed.  Thoroughly rinse your body with warm water from the neck down.  DO NOT shower/wash with your normal soap after using and rinsing off the CHG Soap.  Pat yourself dry with a CLEAN TOWEL.  Wear CLEAN PAJAMAS to bed the night before surgery  Place CLEAN SHEETS on your bed the night before your surgery  DO NOT SLEEP WITH PETS.   Day of Surgery:  Take a shower with CHG soap. Wear Clean/Comfortable clothing the morning of surgery Do not apply any deodorants/lotions.   Remember to brush your teeth WITH YOUR REGULAR TOOTHPASTE.  Do not wear jewelry. Do not wear lotions, powders, cologne or deodorant. Do not shave 48 hours prior to surgery.  Men may shave face and neck. Do not bring valuables to the hospital.   [facility] is not responsible for any belongings or valuables.    Do NOT Smoke (Tobacco/Vaping)  24 hours prior to your procedure  If you use a CPAP at night, you may bring your mask for your overnight stay.   Contacts, glasses, hearing aids, dentures or partials may not be worn into surgery, please bring cases for these belongings   For patients admitted to the hospital, discharge time will be determined by your treatment team.   Patients discharged the day of surgery will not be allowed to drive home, and someone needs to stay with them for 24 hours.    If you received a COVID test during your pre-op visit, it is requested that you wear a mask when out in public, stay away from anyone that may not be feeling well, and notify your surgeon if you develop symptoms. If you have been in contact with anyone that has tested  positive in the last 10 days, please notify your surgeon.    Please read over the following fact sheets that you were given.

## 2022-07-18 NOTE — Progress Notes (Addendum)
Surgical Instructions    Your procedure is scheduled on Thursday, July 24, 2022.  Report to [facility] Main Entrance "A" at 5:30 A.M., then check in with the Admitting office.  Call this number if you have problems the morning of surgery:  [phone]   If you have any questions prior to your surgery date call [phone]: Open Monday-Friday 8am-4pm If you experience any cold or flu symptoms such as cough, fever, chills, shortness of breath, etc. between now and your scheduled surgery, please notify us at the above number     Remember:  Do not eat after midnight the night before your surgery  You may drink clear liquids until 4:30 A.M the morning of your surgery.   Clear liquids allowed are: Water, Non-Citrus Juices (without pulp), Carbonated Beverages, Clear Tea, Black Coffee ONLY (NO MILK, CREAM OR POWDERED CREAMER of any kind), and Gatorade   Enhanced Recovery after Surgery for Orthopedics Enhanced Recovery after Surgery is a protocol used to improve the stress on your body and your recovery after surgery.  Patient Instructions  The day of surgery:  Drink ONE (1) Pre-Surgery Clear Ensure by 4:30 A.M the morning of surgery   This drink was given to you during your hospital  pre-op appointment visit. Nothing else to drink after completing the  Pre-Surgery Clear Ensure.    Take these medicines the morning of surgery with A SIP OF WATER:   metoprolol succinate   If Needed:  acetaminophen (TYLENOL)  ALPRAZolam (XANAX)  traMADol (ULTRAM)   As of today, STOP taking any Aspirin (unless otherwise instructed by your surgeon) Aleve, Naproxen, Ibuprofen, Motrin, Advil, Goody's, BC's, all herbal medications, fish oil, all vitamins, and meloxicam (MOBIC).    SURGICAL WAITING ROOM VISITATION Patients having surgery or a procedure may have no more than 2 support people in the waiting area - these visitors may rotate.   Children under the age of 69 must have an adult with them who  is not the patient. If the patient needs to stay at the hospital during part of their recovery, the visitor guidelines for inpatient rooms apply. Pre-op nurse will coordinate an appropriate time for 1 support person to accompany patient in pre-op.  This support person may not rotate.   Please refer to [email or web address] for the visitor guidelines for Inpatients (after your surgery is over and you are in a regular room).    Special instructions:    Oral Hygiene is also important to reduce your risk of infection.  Remember - BRUSH YOUR TEETH THE MORNING OF SURGERY WITH YOUR REGULAR TOOTHPASTE   [facility]- Preparing For Surgery  Before surgery, you can play an important role. Because skin is not sterile, your skin needs to be as free of germs as possible. You can reduce the number of germs on your skin by washing with CHG (chlorahexidine gluconate) Soap before surgery.  CHG is an antiseptic cleaner which kills germs and bonds with the skin to continue killing germs even after washing.     Please do not use if you have an allergy to CHG or antibacterial soaps. If your skin becomes reddened/irritated stop using the CHG.  Do not shave (including legs and underarms) for at least 48 hours prior to first CHG shower. It is OK to shave your face.  Please follow these instructions carefully.     Shower the NIGHT BEFORE SURGERY and the MORNING OF SURGERY with CHG Soap.   If you chose to wash your hair, wash  your hair first as usual with your normal shampoo. After you shampoo, rinse your hair and body thoroughly to remove the shampoo.  Then [employer] and genitals (private parts) with your normal soap and rinse thoroughly to remove soap.  After that Use CHG Soap as you would any other liquid soap. You can apply CHG directly to the skin and wash gently with a scrungie or a clean washcloth.   Apply the CHG Soap to your body ONLY FROM THE NECK DOWN.  Do  not use on open wounds or open sores. Avoid contact with your eyes, ears, mouth and genitals (private parts). Wash Face and genitals (private parts)  with your normal soap.   Wash thoroughly, paying special attention to the area where your surgery will be performed.  Thoroughly rinse your body with warm water from the neck down.  DO NOT shower/wash with your normal soap after using and rinsing off the CHG Soap.  Pat yourself dry with a CLEAN TOWEL.  Wear CLEAN PAJAMAS to bed the night before surgery  Place CLEAN SHEETS on your bed the night before your surgery  DO NOT SLEEP WITH PETS.   Day of Surgery:  Take a shower with CHG soap. Wear Clean/Comfortable clothing the morning of surgery Do not apply any deodorants/lotions.   Remember to brush your teeth WITH YOUR REGULAR TOOTHPASTE.  Do not wear jewelry or makeup. Do not wear lotions, powders, perfumes/cologne or deodorant. Do not shave 48 hours prior to surgery.  Men may shave face and neck. Do not bring valuables to the hospital. Do not wear nail polish, gel polish, artificial nails, or any other type of covering on natural nails (fingers and toes) If you have artificial nails or gel coating that need to be removed by a nail salon, please have this removed prior to surgery. Artificial nails or gel coating may interfere with anesthesia's ability to adequately monitor your vital signs.  [facility] is not responsible for any belongings or valuables.    Do NOT Smoke (Tobacco/Vaping)  24 hours prior to your procedure  If you use a CPAP at night, you may bring your mask for your overnight stay.   Contacts, glasses, hearing aids, dentures or partials may not be worn into surgery, please bring cases for these belongings   For patients admitted to the hospital, discharge time will be determined by your treatment team.   Patients discharged the day of surgery will not be allowed to drive home, and someone needs to stay with them for  24 hours.    If you received a COVID test during your pre-op visit, it is requested that you wear a mask when out in public, stay away from anyone that may not be feeling well, and notify your surgeon if you develop symptoms. If you have been in contact with anyone that has tested positive in the last 10 days, please notify your surgeon.    Please read over the following fact sheets that you were given.

## 2022-07-21 ENCOUNTER — Other Ambulatory Visit: Payer: Self-pay

## 2022-07-21 ENCOUNTER — Encounter (HOSPITAL_COMMUNITY)
Admission: RE | Admit: 2022-07-21 | Discharge: 2022-07-21 | Disposition: A | Discharge status: Home / Self Care | Payer: Medicare Other | Source: Ambulatory Visit | Attending: Orthopedic Surgery | Admitting: Orthopedic Surgery

## 2022-07-21 ENCOUNTER — Encounter (HOSPITAL_COMMUNITY): Payer: Self-pay

## 2022-07-21 VITALS — BP 159/94 | HR 103 | Temp 98.3°F | Resp 17 | Ht 71.0 in | Wt 212.0 lb

## 2022-07-21 DIAGNOSIS — Z01818 Encounter for other preprocedural examination: Secondary | ICD-10-CM | POA: Diagnosis not present

## 2022-07-21 LAB — URINALYSIS, COMPLETE (UACMP) WITH MICROSCOPIC
Bilirubin Urine: NEGATIVE
Glucose, UA: NEGATIVE mg/dL
Hgb urine dipstick: NEGATIVE
Ketones, ur: NEGATIVE mg/dL
Leukocytes,Ua: NEGATIVE
Nitrite: NEGATIVE
Protein, ur: NEGATIVE mg/dL
RBC / HPF: NONE SEEN RBC/hpf (ref 0–5)
Specific Gravity, Urine: 1.01 (ref 1.005–1.030)
pH: 6 (ref 5.0–8.0)

## 2022-07-21 LAB — COMPREHENSIVE METABOLIC PANEL
ALT: 24 U/L (ref 0–44)
AST: 23 U/L (ref 15–41)
Albumin: 4.1 g/dL (ref 3.5–5.0)
Alkaline Phosphatase: 72 U/L (ref 38–126)
Anion gap: 8 (ref 5–15)
BUN: 9 mg/dL (ref 8–23)
CO2: 25 mmol/L (ref 22–32)
Calcium: 9.3 mg/dL (ref 8.9–10.3)
Chloride: 106 mmol/L (ref 98–111)
Creatinine, Ser: 1.04 mg/dL (ref 0.61–1.24)
GFR, Estimated: >60 mL/min (ref >60)
Glucose, Bld: 109 mg/dL — ABNORMAL HIGH (ref 70–99)
Potassium: 3.7 mmol/L (ref 3.5–5.1)
Sodium: 139 mmol/L (ref 135–145)
Total Bilirubin: 0.7 mg/dL (ref 0.3–1.2)
Total Protein: 7.1 g/dL (ref 6.5–8.1)

## 2022-07-21 LAB — CBC
HCT: 39.3 % (ref 39.0–52.0)
Hemoglobin: 13 g/dL (ref 13.0–17.0)
MCH: 28.3 pg (ref 26.0–34.0)
MCHC: 33.1 g/dL (ref 30.0–36.0)
MCV: 85.6 fL (ref 80.0–100.0)
Platelets: 192 10*3/uL (ref 150–400)
RBC: 4.59 MIL/uL (ref 4.22–5.81)
RDW: 12.7 % (ref 11.5–15.5)
WBC: 5.5 10*3/uL (ref 4.0–10.5)
nRBC: 0 % (ref 0.0–0.2)

## 2022-07-21 LAB — SURGICAL PCR SCREEN
MRSA, PCR: NEGATIVE
Staphylococcus aureus: NEGATIVE

## 2022-07-21 NOTE — Progress Notes (Signed)
Left message on "Debbie" surgery scheduler voicemail. U/A from PAT appointment today had rare bacteria. Patient for surgery on 07/24/22 with Dr.Dean.

## 2022-07-21 NOTE — Progress Notes (Signed)
PCP - Dr.Stacey Charlett Blake Cardiologist - pt denies  PPM/ICD - pt denies Device Orders - n/a Rep Notified - n/a  Chest x-ray - n/a EKG - 07/21/22 Stress Test - 10+ years ago per pt-normal exam ECHO - 10+ years ago per pt-normal exam Cardiac Cath - pt denies  Sleep Study - pt denies CPAP - n/a  Fasting Blood Sugar - pt denies diabetes  Checks Blood Sugar _____ times a day  Last dose of GLP1 agonist-  pt denies GLP1 instructions: n/a  Blood Thinner Instructions:pt denies Aspirin Instructions:pt denies  ERAS Protcol -yes PRE-SURGERY Ensure given at PAT visit today  COVID TEST- n/a   Anesthesia review: NO  Patient denies shortness of breath, fever, cough and chest pain at PAT appointment   All instructions explained to the patient, with a verbal understanding of the material. Patient agrees to go over the instructions while at home for a better understanding. Patient also instructed to self quarantine after being tested for COVID-19. The opportunity to ask questions was provided.

## 2022-07-22 ENCOUNTER — Telehealth: Payer: Self-pay | Admitting: Orthopedic Surgery

## 2022-07-22 LAB — URINE CULTURE: Culture: NO GROWTH

## 2022-07-22 NOTE — Telephone Encounter (Signed)
Patient is scheduled for RIGHT TKA on 07-24-22.  Robin with pre-admission testing calling regarding results of urinalysis:  RARE BACTERIA

## 2022-07-22 NOTE — Telephone Encounter (Signed)
Culture no growth.  Thanks

## 2022-07-23 ENCOUNTER — Telehealth: Payer: Self-pay | Admitting: *Deleted

## 2022-07-23 MED ORDER — TRANEXAMIC ACID 1000 MG/10ML IV SOLN
2000.0000 mg | INTRAVENOUS | Status: DC
  Administered 2022-07-24: 2000 mg via TOPICAL
  Filled 2022-07-23 (×2): qty 20

## 2022-07-23 NOTE — Telephone Encounter (Signed)
Ortho bundle Pre-op call completed. 

## 2022-07-23 NOTE — Anesthesia Preprocedure Evaluation (Signed)
Anesthesia Evaluation  Patient identified by MRN, date of birth, ID band Patient awake    Reviewed: Allergy & Precautions, NPO status , Patient's Chart, lab work & pertinent test results  Airway Mallampati: II       Dental no notable dental hx. (+) Teeth Intact, Dental Advisory Given   Pulmonary pneumonia, resolved   Pulmonary exam normal breath sounds clear to auscultation       Cardiovascular hypertension, Pt. on medications and Pt. on home beta blockers Normal cardiovascular exam Rhythm:Regular Rate:Normal     Neuro/Psych   Anxiety     negative neurological ROS     GI/Hepatic negative GI ROS, Neg liver ROS,,,  Endo/Other  negative endocrine ROS    Renal/GU Renal disease  negative genitourinary   Musculoskeletal  (+) Arthritis , Osteoarthritis,  OA right knee   Abdominal   Peds  Hematology  (+) Blood dyscrasia, anemia   Anesthesia Other Findings   Reproductive/Obstetrics                              Anesthesia Physical Anesthesia Plan  ASA: 2  Anesthesia Plan: Spinal   Post-op Pain Management: Regional block* and Minimal or no pain anticipated   Induction: Intravenous  PONV Risk Score and Plan: 2 and Treatment may vary due to age or medical condition, Propofol infusion and Ondansetron  Airway Management Planned: Natural Airway  Additional Equipment: None  Intra-op Plan:   Post-operative Plan: Extubation in OR  Informed Consent: I have reviewed the patients History and Physical, chart, labs and discussed the procedure including the risks, benefits and alternatives for the proposed anesthesia with the patient or authorized representative who has indicated his/her understanding and acceptance.     Dental advisory given  Plan Discussed with: CRNA and Anesthesiologist  Anesthesia Plan Comments:         Anesthesia Quick Evaluation

## 2022-07-23 NOTE — Care Plan (Signed)
[facility] RNCM call to patient to discuss his upcoming Right total knee arthroplasty with Dr. Marlou Sa on 07/24/22. He is an Ortho bundle patient through [facility] and is agreeable to case management. He lives with his wife and is expecting to return home with her helping him. He has a RW as well as a CPM delivered by [employer] prior to surgery. Anticipate HHPT will be needed after a short hospital stay. Referral made to [facility] after choice provided. Reviewed all post op care instructions with patient. Will continue to follow for needs.

## 2022-07-24 ENCOUNTER — Encounter (HOSPITAL_COMMUNITY)
Admission: RE | Disposition: A | Discharge status: Home Health | Payer: Self-pay | Source: Ambulatory Visit | Attending: Orthopedic Surgery

## 2022-07-24 ENCOUNTER — Ambulatory Visit (HOSPITAL_BASED_OUTPATIENT_CLINIC_OR_DEPARTMENT_OTHER): Payer: Medicare Other | Admitting: Certified Registered"

## 2022-07-24 ENCOUNTER — Observation Stay (HOSPITAL_COMMUNITY)
Admission: RE | Admit: 2022-07-24 | Discharge: 2022-07-25 | Disposition: A | Discharge status: Home Health | Payer: Medicare Other | Source: Ambulatory Visit | Attending: Orthopedic Surgery | Admitting: Orthopedic Surgery

## 2022-07-24 ENCOUNTER — Other Ambulatory Visit: Payer: Self-pay

## 2022-07-24 ENCOUNTER — Encounter (HOSPITAL_COMMUNITY): Payer: Self-pay | Admitting: Orthopedic Surgery

## 2022-07-24 ENCOUNTER — Ambulatory Visit (HOSPITAL_COMMUNITY): Payer: Medicare Other | Admitting: Certified Registered"

## 2022-07-24 DIAGNOSIS — Z96612 Presence of left artificial shoulder joint: Secondary | ICD-10-CM | POA: Diagnosis not present

## 2022-07-24 DIAGNOSIS — Z01818 Encounter for other preprocedural examination: Secondary | ICD-10-CM

## 2022-07-24 DIAGNOSIS — I10 Essential (primary) hypertension: Secondary | ICD-10-CM | POA: Insufficient documentation

## 2022-07-24 DIAGNOSIS — G8918 Other acute postprocedural pain: Secondary | ICD-10-CM | POA: Diagnosis not present

## 2022-07-24 DIAGNOSIS — Z79899 Other long term (current) drug therapy: Secondary | ICD-10-CM | POA: Insufficient documentation

## 2022-07-24 DIAGNOSIS — M1711 Unilateral primary osteoarthritis, right knee: Principal | ICD-10-CM

## 2022-07-24 DIAGNOSIS — Z96651 Presence of right artificial knee joint: Secondary | ICD-10-CM

## 2022-07-24 DIAGNOSIS — M179 Osteoarthritis of knee, unspecified: Secondary | ICD-10-CM | POA: Diagnosis present

## 2022-07-24 HISTORY — PX: TOTAL KNEE ARTHROPLASTY: SHX125

## 2022-07-24 SURGERY — ARTHROPLASTY, KNEE, TOTAL
Anesthesia: Spinal | Site: Knee | Laterality: Right

## 2022-07-24 MED ORDER — BUPIVACAINE HCL (PF) 0.25 % IJ SOLN
INTRAMUSCULAR | Status: AC
  Filled 2022-07-24: qty 30

## 2022-07-24 MED ORDER — LOSARTAN POTASSIUM 50 MG PO TABS
50.0000 mg | ORAL_TABLET | Freq: Every day | ORAL | Status: DC
  Administered 2022-07-25: 50 mg via ORAL
  Filled 2022-07-24: qty 1

## 2022-07-24 MED ORDER — OXYCODONE HCL 5 MG PO TABS
ORAL_TABLET | ORAL | Status: AC
  Filled 2022-07-24: qty 1

## 2022-07-24 MED ORDER — TRANEXAMIC ACID 1000 MG/10ML IV SOLN
INTRAVENOUS | Status: DC | PRN
  Administered 2022-07-24: 2000 mg via TOPICAL

## 2022-07-24 MED ORDER — SODIUM CHLORIDE 0.9 % IR SOLN
Status: DC | PRN
  Administered 2022-07-24: 3000 mL

## 2022-07-24 MED ORDER — PROPOFOL 500 MG/50ML IV EMUL
INTRAVENOUS | Status: DC | PRN
  Administered 2022-07-24: 80 ug/kg/min via INTRAVENOUS

## 2022-07-24 MED ORDER — ACETAMINOPHEN 500 MG PO TABS
1000.0000 mg | ORAL_TABLET | Freq: Four times a day (QID) | ORAL | Status: AC
  Administered 2022-07-24 – 2022-07-25 (×4): 1000 mg via ORAL
  Filled 2022-07-24 (×4): qty 2

## 2022-07-24 MED ORDER — METHOCARBAMOL 500 MG PO TABS
500.0000 mg | ORAL_TABLET | Freq: Four times a day (QID) | ORAL | Status: DC | PRN
  Administered 2022-07-24 – 2022-07-25 (×3): 500 mg via ORAL
  Filled 2022-07-24 (×3): qty 1

## 2022-07-24 MED ORDER — METOPROLOL SUCCINATE ER 50 MG PO TB24
50.0000 mg | ORAL_TABLET | Freq: Every day | ORAL | Status: DC
  Administered 2022-07-25: 50 mg via ORAL
  Filled 2022-07-24: qty 1

## 2022-07-24 MED ORDER — METOCLOPRAMIDE HCL 5 MG/ML IJ SOLN: 5.0000 mg | Freq: Three times a day (TID) | INTRAMUSCULAR | Status: DC | PRN

## 2022-07-24 MED ORDER — ONDANSETRON HCL 4 MG/2ML IJ SOLN
INTRAMUSCULAR | Status: DC | PRN
  Administered 2022-07-24: 4 mg via INTRAVENOUS

## 2022-07-24 MED ORDER — HYDROMORPHONE HCL 1 MG/ML IJ SOLN
0.5000 mg | INTRAMUSCULAR | Status: DC | PRN
  Administered 2022-07-24: 0.5 mg via INTRAVENOUS
  Filled 2022-07-24: qty 0.5

## 2022-07-24 MED ORDER — AMLODIPINE BESYLATE 10 MG PO TABS
10.0000 mg | ORAL_TABLET | Freq: Every day | ORAL | Status: DC
  Administered 2022-07-25: 10 mg via ORAL
  Filled 2022-07-24: qty 1

## 2022-07-24 MED ORDER — VANCOMYCIN HCL 1000 MG IV SOLR
INTRAVENOUS | Status: DC | PRN
  Administered 2022-07-24: 1000 mg via TOPICAL

## 2022-07-24 MED ORDER — BUPIVACAINE LIPOSOME 1.3 % IJ SUSP
INTRAMUSCULAR | Status: DC | PRN
  Administered 2022-07-24: 20 mL

## 2022-07-24 MED ORDER — PHENYLEPHRINE HCL-NACL 20-0.9 MG/250ML-% IV SOLN
INTRAVENOUS | Status: DC | PRN
  Administered 2022-07-24: 50 ug/min via INTRAVENOUS

## 2022-07-24 MED ORDER — HYDROMORPHONE HCL 1 MG/ML IJ SOLN
INTRAMUSCULAR | Status: AC
  Filled 2022-07-24: qty 0.5

## 2022-07-24 MED ORDER — HYDROMORPHONE HCL 1 MG/ML IJ SOLN
1.0000 mg | INTRAMUSCULAR | Status: DC | PRN
  Administered 2022-07-24: 1 mg via INTRAVENOUS
  Filled 2022-07-24: qty 1

## 2022-07-24 MED ORDER — OXYCODONE HCL 5 MG PO TABS
5.0000 mg | ORAL_TABLET | ORAL | Status: DC | PRN
  Administered 2022-07-25: 10 mg via ORAL
  Filled 2022-07-24: qty 2

## 2022-07-24 MED ORDER — SODIUM CHLORIDE 0.9% FLUSH
INTRAVENOUS | Status: DC | PRN
  Administered 2022-07-24: 20 mL

## 2022-07-24 MED ORDER — ONDANSETRON HCL 4 MG/2ML IJ SOLN
4.0000 mg | Freq: Four times a day (QID) | INTRAMUSCULAR | Status: DC | PRN
  Administered 2022-07-24: 4 mg via INTRAVENOUS
  Filled 2022-07-24: qty 2

## 2022-07-24 MED ORDER — LACTATED RINGERS IV SOLN: INTRAVENOUS | Status: DC

## 2022-07-24 MED ORDER — CEFAZOLIN SODIUM-DEXTROSE 2-4 GM/100ML-% IV SOLN
2.0000 g | INTRAVENOUS | Status: AC
  Administered 2022-07-24: 2 g via INTRAVENOUS
  Filled 2022-07-24: qty 100

## 2022-07-24 MED ORDER — PHENOL 1.4 % MT LIQD: 1.0000 | OROMUCOSAL | Status: DC | PRN

## 2022-07-24 MED ORDER — PROPOFOL 10 MG/ML IV BOLUS
INTRAVENOUS | Status: DC | PRN
  Administered 2022-07-24 (×2): 20 mg via INTRAVENOUS

## 2022-07-24 MED ORDER — BUPIVACAINE HCL 0.25 % IJ SOLN
INTRAMUSCULAR | Status: DC | PRN
  Administered 2022-07-24: 10 mL
  Administered 2022-07-24: 20 mL

## 2022-07-24 MED ORDER — CEFAZOLIN SODIUM-DEXTROSE 1-4 GM/50ML-% IV SOLN
1.0000 g | Freq: Three times a day (TID) | INTRAVENOUS | Status: AC
  Administered 2022-07-24 (×2): 1 g via INTRAVENOUS
  Filled 2022-07-24 (×2): qty 50

## 2022-07-24 MED ORDER — ASPIRIN 81 MG PO CHEW
81.0000 mg | CHEWABLE_TABLET | Freq: Two times a day (BID) | ORAL | Status: DC
  Administered 2022-07-24 – 2022-07-25 (×2): 81 mg via ORAL
  Filled 2022-07-24 (×2): qty 1

## 2022-07-24 MED ORDER — POVIDONE-IODINE 10 % EX SWAB
2.0000 | Freq: Once | CUTANEOUS | Status: DC
  Administered 2022-07-24: 2 via TOPICAL

## 2022-07-24 MED ORDER — VANCOMYCIN HCL 1000 MG IV SOLR
INTRAVENOUS | Status: AC
  Filled 2022-07-24: qty 20

## 2022-07-24 MED ORDER — GABAPENTIN 300 MG PO CAPS
300.0000 mg | ORAL_CAPSULE | Freq: Three times a day (TID) | ORAL | Status: DC
  Administered 2022-07-24 – 2022-07-25 (×3): 300 mg via ORAL
  Filled 2022-07-24 (×3): qty 1

## 2022-07-24 MED ORDER — MORPHINE SULFATE (PF) 4 MG/ML IV SOLN
INTRAVENOUS | Status: DC | PRN
  Administered 2022-07-24: 8 ug via INTRAVENOUS

## 2022-07-24 MED ORDER — HYDROMORPHONE HCL 1 MG/ML IJ SOLN
INTRAMUSCULAR | Status: DC | PRN
  Administered 2022-07-24: .5 mg via INTRAVENOUS

## 2022-07-24 MED ORDER — DEXAMETHASONE SODIUM PHOSPHATE 10 MG/ML IJ SOLN
INTRAMUSCULAR | Status: DC | PRN
  Administered 2022-07-24: 10 mg via INTRAVENOUS

## 2022-07-24 MED ORDER — ORAL CARE MOUTH RINSE: 15.0000 mL | Freq: Once | OROMUCOSAL | Status: AC

## 2022-07-24 MED ORDER — POVIDONE-IODINE 7.5 % EX SOLN
Freq: Once | CUTANEOUS | Status: DC
  Filled 2022-07-24: qty 118

## 2022-07-24 MED ORDER — FENTANYL CITRATE (PF) 250 MCG/5ML IJ SOLN
INTRAMUSCULAR | Status: AC
  Filled 2022-07-24: qty 5

## 2022-07-24 MED ORDER — POVIDONE-IODINE 10 % EX SWAB
2.0000 | Freq: Once | CUTANEOUS | Status: AC
  Administered 2022-07-24: 2 via TOPICAL

## 2022-07-24 MED ORDER — OXYCODONE HCL 5 MG/5ML PO SOLN: 5.0000 mg | Freq: Once | ORAL | Status: AC | PRN

## 2022-07-24 MED ORDER — BUPIVACAINE IN DEXTROSE 0.75-8.25 % IT SOLN
INTRATHECAL | Status: DC | PRN
  Administered 2022-07-24: 2 mL via INTRATHECAL

## 2022-07-24 MED ORDER — METHOCARBAMOL 1000 MG/10ML IJ SOLN: 500.0000 mg | Freq: Four times a day (QID) | INTRAVENOUS | Status: DC | PRN

## 2022-07-24 MED ORDER — BUPIVACAINE LIPOSOME 1.3 % IJ SUSP
INTRAMUSCULAR | Status: AC
  Filled 2022-07-24: qty 20

## 2022-07-24 MED ORDER — VANCOMYCIN HCL 1000 MG IV SOLR
INTRAVENOUS | Status: DC | PRN
  Administered 2022-07-24: 1000 mg

## 2022-07-24 MED ORDER — CLONIDINE HCL (ANALGESIA) 100 MCG/ML EP SOLN
EPIDURAL | Status: DC | PRN
  Administered 2022-07-24: 1 mL

## 2022-07-24 MED ORDER — TRANEXAMIC ACID-NACL 1000-0.7 MG/100ML-% IV SOLN
1000.0000 mg | INTRAVENOUS | Status: AC
  Administered 2022-07-24: 1000 mg via INTRAVENOUS
  Filled 2022-07-24: qty 100

## 2022-07-24 MED ORDER — PROPOFOL 10 MG/ML IV BOLUS
INTRAVENOUS | Status: AC
  Filled 2022-07-24: qty 20

## 2022-07-24 MED ORDER — CHLORHEXIDINE GLUCONATE 0.12 % MT SOLN
15.0000 mL | Freq: Once | OROMUCOSAL | Status: AC
  Administered 2022-07-24: 15 mL via OROMUCOSAL
  Filled 2022-07-24: qty 15

## 2022-07-24 MED ORDER — ONDANSETRON HCL 4 MG/2ML IJ SOLN: 4.0000 mg | Freq: Once | INTRAMUSCULAR | Status: DC | PRN

## 2022-07-24 MED ORDER — IRRISEPT - 450ML BOTTLE WITH 0.05% CHG IN STERILE WATER, USP 99.95% OPTIME
TOPICAL | Status: DC | PRN
  Administered 2022-07-24: 450 mL via TOPICAL
  Administered 2022-07-24: 10 mL via TOPICAL

## 2022-07-24 MED ORDER — TAMSULOSIN HCL 0.4 MG PO CAPS
0.4000 mg | ORAL_CAPSULE | Freq: Every day | ORAL | Status: DC
  Administered 2022-07-25: 0.4 mg via ORAL
  Filled 2022-07-24: qty 1

## 2022-07-24 MED ORDER — MENTHOL 3 MG MT LOZG: 1.0000 | LOZENGE | OROMUCOSAL | Status: DC | PRN

## 2022-07-24 MED ORDER — 0.9 % SODIUM CHLORIDE (POUR BTL) OPTIME
TOPICAL | Status: DC | PRN
  Administered 2022-07-24 (×5): 1000 mL

## 2022-07-24 MED ORDER — NAPROXEN 250 MG PO TABS
250.0000 mg | ORAL_TABLET | Freq: Two times a day (BID) | ORAL | Status: DC
  Administered 2022-07-24 – 2022-07-25 (×2): 250 mg via ORAL
  Filled 2022-07-24 (×2): qty 1

## 2022-07-24 MED ORDER — FENTANYL CITRATE (PF) 250 MCG/5ML IJ SOLN
INTRAMUSCULAR | Status: DC | PRN
  Administered 2022-07-24: 50 ug via INTRAVENOUS
  Administered 2022-07-24: 100 ug via INTRAVENOUS
  Administered 2022-07-24 (×2): 50 ug via INTRAVENOUS

## 2022-07-24 MED ORDER — MORPHINE SULFATE (PF) 4 MG/ML IV SOLN
INTRAVENOUS | Status: AC
  Filled 2022-07-24: qty 2

## 2022-07-24 MED ORDER — METOCLOPRAMIDE HCL 5 MG PO TABS: 5.0000 mg | ORAL_TABLET | Freq: Three times a day (TID) | ORAL | Status: DC | PRN

## 2022-07-24 MED ORDER — CLONIDINE HCL (ANALGESIA) 100 MCG/ML EP SOLN
EPIDURAL | Status: AC
  Filled 2022-07-24: qty 10

## 2022-07-24 MED ORDER — ONDANSETRON HCL 4 MG PO TABS: 4.0000 mg | ORAL_TABLET | Freq: Four times a day (QID) | ORAL | Status: DC | PRN

## 2022-07-24 MED ORDER — DOCUSATE SODIUM 100 MG PO CAPS
100.0000 mg | ORAL_CAPSULE | Freq: Two times a day (BID) | ORAL | Status: DC
  Administered 2022-07-24 – 2022-07-25 (×2): 100 mg via ORAL
  Filled 2022-07-24 (×2): qty 1

## 2022-07-24 MED ORDER — OXYCODONE HCL 5 MG PO TABS
5.0000 mg | ORAL_TABLET | Freq: Once | ORAL | Status: AC | PRN
  Administered 2022-07-24: 5 mg via ORAL

## 2022-07-24 MED ORDER — SODIUM CHLORIDE 0.9 % IV SOLN: INTRAVENOUS | Status: AC

## 2022-07-24 MED ORDER — ACETAMINOPHEN 325 MG PO TABS: 325.0000 mg | ORAL_TABLET | Freq: Four times a day (QID) | ORAL | Status: DC | PRN

## 2022-07-24 MED ORDER — HYDROMORPHONE HCL 1 MG/ML IJ SOLN: 0.2500 mg | INTRAMUSCULAR | Status: DC | PRN

## 2022-07-24 SURGICAL SUPPLY — 87 items
BAG COUNTER SPONGE SURGICOUNT (BAG) ×1 IMPLANT
BAG DECANTER FOR FLEXI CONT (MISCELLANEOUS) ×1 IMPLANT
BANDAGE ESMARK 6X9 LF (GAUZE/BANDAGES/DRESSINGS) ×1 IMPLANT
BLADE SAG 18X100X1.27 (BLADE) ×1 IMPLANT
BLADE SAGITTAL (BLADE) ×1
BLADE SAW THK.89X75X18XSGTL (BLADE) ×1 IMPLANT
BNDG COHESIVE 6X5 TAN STRL LF (GAUZE/BANDAGES/DRESSINGS) ×1 IMPLANT
BNDG ELASTIC 6X15 VLCR STRL LF (GAUZE/BANDAGES/DRESSINGS) ×1 IMPLANT
BNDG ESMARK 6X9 LF (GAUZE/BANDAGES/DRESSINGS) ×1
BOWL SMART MIX CTS (DISPOSABLE) IMPLANT
CATH FOLEY LATEX FREE 16FR (CATHETERS) ×1
CATH FOLEY LF 16FR (CATHETERS) IMPLANT
CNTNR URN SCR LID CUP LEK RST (MISCELLANEOUS) ×1 IMPLANT
COMP PATELLAR 10X35 METAL (Joint) ×1 IMPLANT
COMP TIB KNEE PS G 0 RT (Joint) ×1 IMPLANT
COMPONENT PATELLAR 10X35 METAL (Joint) IMPLANT
COMPONENT TIB KNEE PS G 0 RT (Joint) IMPLANT
CONT SPEC 4OZ STRL OR WHT (MISCELLANEOUS) ×1
COOLER ICEMAN CLASSIC (MISCELLANEOUS) IMPLANT
COVER SURGICAL LIGHT HANDLE (MISCELLANEOUS) ×1 IMPLANT
CUFF TOURN SGL QUICK 34 (TOURNIQUET CUFF) ×1
CUFF TRNQT CYL 34X4.125X (TOURNIQUET CUFF) ×1 IMPLANT
DRAPE INCISE IOBAN 66X45 STRL (DRAPES) IMPLANT
DRAPE ORTHO SPLIT 77X108 STRL (DRAPES) ×3
DRAPE SURG ORHT 6 SPLT 77X108 (DRAPES) ×3 IMPLANT
DRAPE U-SHAPE 47X51 STRL (DRAPES) ×1 IMPLANT
DRSG AQUACEL AG ADV 3.5X10 (GAUZE/BANDAGES/DRESSINGS) IMPLANT
DRSG AQUACEL AG ADV 3.5X14 (GAUZE/BANDAGES/DRESSINGS) IMPLANT
DURAPREP 26ML APPLICATOR (WOUND CARE) ×2 IMPLANT
ELECT CAUTERY BLADE 6.4 (BLADE) ×1 IMPLANT
ELECT REM PT RETURN 9FT ADLT (ELECTROSURGICAL) ×1
ELECTRODE REM PT RTRN 9FT ADLT (ELECTROSURGICAL) ×1 IMPLANT
GAUZE SPONGE 4X4 12PLY STRL (GAUZE/BANDAGES/DRESSINGS) ×1 IMPLANT
GLOVE BIO SURGEON STRL SZ 6.5 (GLOVE) IMPLANT
GLOVE BIOGEL PI IND STRL 7.0 (GLOVE) ×1 IMPLANT
GLOVE BIOGEL PI IND STRL 8 (GLOVE) ×1 IMPLANT
GLOVE ECLIPSE 7.0 STRL STRAW (GLOVE) ×1 IMPLANT
GLOVE ECLIPSE 8.0 STRL XLNG CF (GLOVE) ×1 IMPLANT
GLOVE SURG ENC MOIS LTX SZ6.5 (GLOVE) ×3 IMPLANT
GOWN STRL REUS W/ TWL LRG LVL3 (GOWN DISPOSABLE) ×3 IMPLANT
GOWN STRL REUS W/TWL LRG LVL3 (GOWN DISPOSABLE) ×3
HANDPIECE INTERPULSE COAX TIP (DISPOSABLE) ×1
HDLS TROCR DRIL PIN KNEE 75 (PIN) ×1
HOOD PEEL AWAY FLYTE STAYCOOL (MISCELLANEOUS) ×3 IMPLANT
IMMOBILIZER KNEE 22 UNIV (SOFTGOODS) IMPLANT
IMMOBILIZER KNEE 24 THIGH 36 (MISCELLANEOUS) IMPLANT
IMMOBILIZER KNEE 24 UNIV (MISCELLANEOUS)
INSERT TIBIAL PERSONA 10 RT (Insert) IMPLANT
KIT BASIN OR (CUSTOM PROCEDURE TRAY) ×1 IMPLANT
KIT TURNOVER KIT B (KITS) ×1 IMPLANT
MANIFOLD NEPTUNE II (INSTRUMENTS) ×1 IMPLANT
NDL 22X1.5 STRL (OR ONLY) (MISCELLANEOUS) ×2 IMPLANT
NDL SPNL 18GX3.5 QUINCKE PK (NEEDLE) ×1 IMPLANT
NEEDLE 22X1.5 STRL (OR ONLY) (MISCELLANEOUS) ×2 IMPLANT
NEEDLE SPNL 18GX3.5 QUINCKE PK (NEEDLE) ×1 IMPLANT
NS IRRIG 1000ML POUR BTL (IV SOLUTION) ×2 IMPLANT
PACK TOTAL JOINT (CUSTOM PROCEDURE TRAY) ×1 IMPLANT
PAD ARMBOARD 7.5X6 YLW CONV (MISCELLANEOUS) ×2 IMPLANT
PAD CAST 4YDX4 CTTN HI CHSV (CAST SUPPLIES) ×1 IMPLANT
PAD COLD SHLDR WRAP-ON (PAD) IMPLANT
PADDING CAST ABS COTTON 6X4 NS (CAST SUPPLIES) IMPLANT
PADDING CAST COTTON 4X4 STRL (CAST SUPPLIES) ×1
PADDING CAST COTTON 6X4 STRL (CAST SUPPLIES) ×1 IMPLANT
PIN DRILL HDLS TROCAR 75 4PK (PIN) IMPLANT
PROS FEM KNEE PS STD 9 RT (Joint) ×1 IMPLANT
PROSTHESIS FEM KNEE PS STD9 RT (Joint) IMPLANT
SCREW FEMALE HEX FIX 25X2.5 (ORTHOPEDIC DISPOSABLE SUPPLIES) IMPLANT
SET HNDPC FAN SPRY TIP SCT (DISPOSABLE) ×1 IMPLANT
SPIKE FLUID TRANSFER (MISCELLANEOUS) ×1 IMPLANT
STRIP CLOSURE SKIN 1/2X4 (GAUZE/BANDAGES/DRESSINGS) ×2 IMPLANT
SUCTION FRAZIER HANDLE 10FR (MISCELLANEOUS) ×1
SUCTION TUBE FRAZIER 10FR DISP (MISCELLANEOUS) ×1 IMPLANT
SUT MNCRL AB 3-0 PS2 18 (SUTURE) ×1 IMPLANT
SUT VIC AB 0 CT1 27 (SUTURE) ×5
SUT VIC AB 0 CT1 27XBRD ANBCTR (SUTURE) ×3 IMPLANT
SUT VIC AB 1 CT1 36 (SUTURE) ×5 IMPLANT
SUT VIC AB 1 CTX 18 (SUTURE) IMPLANT
SUT VIC AB 2-0 CT1 27 (SUTURE) ×4
SUT VIC AB 2-0 CT1 TAPERPNT 27 (SUTURE) ×4 IMPLANT
SYR 30ML LL (SYRINGE) ×3 IMPLANT
SYR TB 1ML LUER SLIP (SYRINGE) ×1 IMPLANT
TIBIAL INSERT PERSONA 10 RT (Insert) ×1 IMPLANT
TOWEL GREEN STERILE (TOWEL DISPOSABLE) ×2 IMPLANT
TOWEL GREEN STERILE FF (TOWEL DISPOSABLE) ×2 IMPLANT
TRAY CATH 16FR W/PLASTIC CATH (SET/KITS/TRAYS/PACK) IMPLANT
WATER STERILE IRR 1000ML POUR (IV SOLUTION) IMPLANT
YANKAUER SUCT BULB TIP NO VENT (SUCTIONS) ×1 IMPLANT

## 2022-07-24 NOTE — Brief Op Note (Signed)
   07/24/2022  11:14 AM  PATIENT:  [name]  76 y.o. male  PRE-OPERATIVE DIAGNOSIS:  RIGHT KNEE OSTEOARTHRITIS  POST-OPERATIVE DIAGNOSIS:  right knee oa  PROCEDURE:  Procedure(s): RIGHT TOTAL KNEE ARTHROPLASTY  SURGEON:  Surgeon(s): Meredith Pel, MD  ASSISTANT: magnant pa  ANESTHESIA:   spinal  EBL: 100 ml    Total I/O In: 3736 [I.V.:1650] Out: 100 [Blood:100]  BLOOD ADMINISTERED: none  DRAINS: none   LOCAL MEDICATIONS USED:  marcaine mso4 clonidine exparel vanco  SPECIMEN:  No Specimen  COUNTS:  YES  TOURNIQUET:  92 min at 300 mm hg  DICTATION: .Other Dictation: Dictation Number [id number]  PLAN OF CARE: Admit for overnight observation  PATIENT DISPOSITION:  PACU - hemodynamically stable

## 2022-07-24 NOTE — Progress Notes (Signed)
Orthopedic Tech Progress Note Patient Details:  [name] 1945-11-02 [id number]  CPM applied to RLE and turned on. Pt is tolerating this ROM very well.  CPM Right Knee CPM Right Knee: On Right Knee Flexion (Degrees): 40 Right Knee Extension (Degrees): 10  Post Interventions Patient Tolerated: Well Instructions Provided: Care of device, Adjustment of device  [name] Jeri Modena 07/24/2022, 6:22 PM

## 2022-07-24 NOTE — Progress Notes (Signed)
Orthopedic Tech Progress Note Patient Details:  [name] 05/25/1946 [id number]  CPM removed then replaced with knee immobilizer and bone foam.    CPM Right Knee CPM Right Knee: Off Right Knee Flexion (Degrees): 40 Right Knee Extension (Degrees): 10  Post Interventions Patient Tolerated: Well Instructions Provided: Care of device, Adjustment of device Ortho Devices Type of Ortho Device: Bone foam zero knee Ortho Device/Splint Location: RLE   Post Interventions Patient Tolerated: Well Instructions Provided: Care of device, Adjustment of device  [name] Jeri Modena 07/24/2022, 8:41 PM

## 2022-07-24 NOTE — Anesthesia Procedure Notes (Signed)
Anesthesia Regional Block: Adductor canal block   Pre-Anesthetic Checklist: , timeout performed,  Correct Patient, Correct Site, Correct Laterality,  Correct Procedure, Correct Position, site marked,  Risks and benefits discussed,  Surgical consent,  Pre-op evaluation,  At surgeon's request and post-op pain management  Laterality: Right  Prep: chloraprep       Needles:  Injection technique: Single-shot  Needle Type: Echogenic Stimulator Needle     Needle Length: 10cm  Needle Gauge: 21   Needle insertion depth: 8 cm   Additional Needles:   Narrative:  Start time: 07/24/2022 7:20 AM End time: 07/24/2022 7:25 AM Injection made incrementally with aspirations every 5 mL.  Performed by: Personally  Anesthesiologist: Josephine Igo, MD  Additional Notes: .Timeout performed. Patient sedated. Relevant anatomy ID'd using [place]. Incremental 2-36m injection of LA with frequent aspiration. Patient tolerated procedure well. `

## 2022-07-24 NOTE — TOC Initial Note (Signed)
Transition of Care [facility]) - Initial/Assessment Note    Patient Details  Name: [name] MRN: [id number] Date of Birth: [birth year]  Transition of Care [facility]) CM/SW Contact:    Sharin Mons, RN Phone Number: 07/24/2022, 3:20 PM  Clinical Narrative:                      - s/p Right total knee replacement, 07/24/2022  NCM @ bedside to discuss d/c planning. Pt sedated, wife @ bedside. Per wife PTA independent with ADL's . States has RW and CPM @ home. Home health services arranged by provider's office prior to surgery with [facility]. Pt with 2 steps to enter into the home from the  garage. 13 steps to get to bedroom once inside of home, however, wife states the plan is to stay on the 1st floor of home to recovery.   Pt without transportation issues.  PT evaluation pending .Marland Kitchen...  TOC team will continue to monitor and assist with needs.   Expected Discharge Plan: [place] Barriers to Discharge: Continued Medical Work up   Patient Goals and CMS Choice            Expected Discharge Plan and Services                                   HH Arranged: PT HH Agency: [place] Date [place]: 07/24/22 Time [place]: 1518 Representative spoke with at [place]: [place] Arrangements/Services     Patient language and need for interpreter reviewed:: Yes Do you feel safe going back to the place where you live?: Yes      Need for Family Participation in Patient Care: Yes (Comment) Care giver support system in place?: Yes (comment) Current home services: DME (RW, CPM) Criminal Activity/Legal Involvement Pertinent to Current Situation/Hospitalization: No - Comment as needed  Activities of Daily Living Home Assistive Devices/Equipment: None ADL Screening (condition at time of admission) Patient's cognitive ability adequate to safely complete daily activities?: Yes Is the patient deaf or have  difficulty hearing?: No Does the patient have difficulty seeing, even when wearing glasses/contacts?: No Does the patient have difficulty concentrating, remembering, or making decisions?: No Patient able to express need for assistance with ADLs?: Yes Does the patient have difficulty dressing or bathing?: Yes Independently performs ADLs?: No Communication: Needs assistance Is this a change from baseline?: Change from baseline, expected to last >3 days Dressing (OT): Needs assistance Is this a change from baseline?: Change from baseline, expected to last >3 days Grooming: Independent Feeding: Independent Bathing: Needs assistance Is this a change from baseline?: Change from baseline, expected to last >3 days Toileting: Needs assistance Is this a change from baseline?: Change from baseline, expected to last >3days In/Out Bed: Needs assistance Is this a change from baseline?: Change from baseline, expected to last >3 days Walks in Home: Needs assistance Does the patient have difficulty walking or climbing stairs?: Yes Weakness of Legs: Right Weakness of Arms/Hands: None  Permission Sought/Granted   Permission granted to share information with : Yes, Verbal Permission Granted  Share Information with NAME: TAREN TOOPS  Spouse  [phone]           Emotional Assessment Appearance:: Appears stated age       Alcohol / Substance Use: Not Applicable Psych Involvement: No (comment)  Admission diagnosis:  OA (osteoarthritis) of knee [M17.9] S/P total knee arthroplasty, right [Z96.651] Patient Active Problem List   Diagnosis Date Noted   S/P total knee arthroplasty, right 07/24/2022   Shoulder arthritis    S/P shoulder replacement, 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 [place] [address] Phone: [phone] Fax: [phone]  [id number] [place], [place] - [id number]: [phone]: [phone]    Social Determinants of Health (SDOH) Social History: [place] No Food Insecurity (07/24/2022)  Housing: Low Risk  (07/24/2022)  Transportation Needs: No Transportation Needs (07/24/2022)  Utilities: Not At Risk (07/24/2022)  Alcohol Screen: Low Risk  (04/22/2021)  Depression (PHQ2-9): Low Risk  (04/24/2022)  Financial Resource Strain: Low Risk  (04/22/2021)  Physical Activity: Sufficiently Active (04/22/2021)  Social Connections: Socially Integrated (04/22/2021)  Stress: No Stress Concern Present (04/22/2021)  Tobacco Use: Low Risk  (07/24/2022)   SDOH Interventions:     Readmission Risk Interventions     No data to display

## 2022-07-24 NOTE — Transfer of Care (Signed)
Immediate Anesthesia Transfer of Care Note  Patient: [name]  Procedure(s) Performed: RIGHT TOTAL KNEE ARTHROPLASTY (Right: Knee)  Patient Location: PACU  Anesthesia Type:MAC and Spinal  Level of Consciousness: awake, confused, and responds to stimulation  Airway & Oxygen Therapy: Patient Spontanous Breathing and Patient connected to nasal cannula oxygen  Post-op Assessment: Report given to RN and Post -op Vital signs reviewed and stable  Post vital signs: Reviewed and stable  Last Vitals:  Vitals Value Taken Time  BP    Temp    Pulse 78 07/24/22 1051  Resp 10 07/24/22 1051  SpO2 91 % 07/24/22 1051  Vitals shown include unvalidated device data.  Last Pain:  Vitals:   07/24/22 0624  PainSc: 5       Patients Stated Pain Goal: 0 (74/14/23 9532)  Complications: No notable events documented.

## 2022-07-24 NOTE — Anesthesia Procedure Notes (Signed)
Spinal  Patient location during procedure: OR Start time: 07/24/2022 7:32 AM End time: 07/24/2022 7:37 AM Reason for block: surgical anesthesia Staffing Performed by: Josephine Igo, MD Authorized by: Josephine Igo, MD   Preanesthetic Checklist Completed: patient identified, IV checked, site marked, risks and benefits discussed, surgical consent, monitors and equipment checked, pre-op evaluation and timeout performed Spinal Block Patient position: sitting Prep: DuraPrep and site prepped and draped Patient monitoring: heart rate, cardiac monitor, continuous pulse ox and blood pressure Approach: midline Location: L4-5 Injection technique: single-shot Needle Needle type: Pencan  Needle gauge: 24 G Needle length: 9 cm Needle insertion depth: 8 cm Assessment Sensory level: T4 Events: CSF return Additional Notes Patient tolerated procedure well. Adequate sensory level.

## 2022-07-24 NOTE — Plan of Care (Signed)
  Problem: Education: ?Goal: Knowledge of the prescribed therapeutic regimen will improve ?Outcome: Progressing ?Goal: Individualized Educational Video(s) ?Outcome: Progressing ?  ?Problem: Activity: ?Goal: Ability to avoid complications of mobility impairment will improve ?Outcome: Progressing ?Goal: Range of joint motion will improve ?Outcome: Progressing ?  ?Problem: Clinical Measurements: ?Goal: Postoperative complications will be avoided or minimized ?Outcome: Progressing ?  ?Problem: Pain Management: ?Goal: Pain level will decrease with appropriate interventions ?Outcome: Progressing ?  ?Problem: Skin Integrity: ?Goal: Will show signs of wound healing ?Outcome: Progressing ?  ?Problem: Education: ?Goal: Knowledge of General Education information will improve ?Description: Including pain rating scale, medication(s)/side effects and non-pharmacologic comfort measures ?Outcome: Progressing ?  ?Problem: Health Behavior/Discharge Planning: ?Goal: Ability to manage health-related needs will improve ?Outcome: Progressing ?  ?Problem: Clinical Measurements: ?Goal: Ability to maintain clinical measurements within normal limits will improve ?Outcome: Progressing ?Goal: Will remain free from infection ?Outcome: Progressing ?Goal: Diagnostic test results will improve ?Outcome: Progressing ?Goal: Respiratory complications will improve ?Outcome: Progressing ?Goal: Cardiovascular complication will be avoided ?Outcome: Progressing ?  ?Problem: Activity: ?Goal: Risk for activity intolerance will decrease ?Outcome: Progressing ?  ?Problem: Nutrition: ?Goal: Adequate nutrition will be maintained ?Outcome: Progressing ?  ?Problem: Coping: ?Goal: Level of anxiety will decrease ?Outcome: Progressing ?  ?Problem: Elimination: ?Goal: Will not experience complications related to bowel motility ?Outcome: Progressing ?Goal: Will not experience complications related to urinary retention ?Outcome: Progressing ?  ?Problem: Pain  Managment: ?Goal: General experience of comfort will improve ?Outcome: Progressing ?  ?Problem: Safety: ?Goal: Ability to remain free from injury will improve ?Outcome: Progressing ?  ?Problem: Skin Integrity: ?Goal: Risk for impaired skin integrity will decrease ?Outcome: Progressing ?  ?

## 2022-07-24 NOTE — Anesthesia Postprocedure Evaluation (Signed)
Anesthesia Post Note  Patient: [name]  Procedure(s) Performed: RIGHT TOTAL KNEE ARTHROPLASTY (Right: Knee)     Patient location during evaluation: PACU Anesthesia Type: Spinal Level of consciousness: oriented and awake and alert Pain management: pain level controlled Vital Signs Assessment: post-procedure vital signs reviewed and stable Respiratory status: spontaneous breathing, respiratory function stable and nonlabored ventilation Cardiovascular status: blood pressure returned to baseline and stable Postop Assessment: no headache, no backache, no apparent nausea or vomiting, patient able to bend at knees and spinal receding Anesthetic complications: no   No notable events documented.  Last Vitals:  Vitals:   07/24/22 1200 07/24/22 1215  BP: (!) 88/66 99/72  Pulse: 71 68  Resp: 11 16  Temp:    SpO2: 100% 100%    Last Pain:  Vitals:   07/24/22 1215  PainSc: 6     LLE Motor Response: Purposeful movement (07/24/22 1215) LLE Sensation: Full sensation (07/24/22 1215) RLE Motor Response: Purposeful movement (07/24/22 1215) RLE Sensation: Full sensation (07/24/22 1215)      Berlynn Warsame A.

## 2022-07-24 NOTE — Evaluation (Signed)
Physical Therapy Evaluation Patient Details Name: [name] MRN: [id number] DOB: Aug 20, 1945 Today's Date: 07/24/2022  History of Present Illness  Patient is a 77 y/o male admitted due to R knee OA now s/p TKA.  PMH positive for L shoulder replacement, HTN, HLD, ventral hernia repair.  Clinical Impression  Patient presents with decreased mobility due to pain and limited AROM R LE.  Currently minguard overall to ambulate in room to bathroom.  Limited distance due to N&V.  Patient should continue to progress well and practice steps in am prior to d/c.  PT will follow up.        Recommendations for follow up therapy are one component of a multi-disciplinary discharge planning process, led by the attending physician.  Recommendations may be updated based on patient status, additional functional criteria and insurance authorization.  Follow Up Recommendations Follow physician's recommendations for discharge plan and follow up therapies      Assistance Recommended at Discharge Intermittent Supervision/Assistance  Patient can return home with the following  A little help with walking and/or transfers;A little help with bathing/dressing/bathroom;Assist for transportation;Help with stairs or ramp for entrance    Equipment Recommendations None recommended by PT  Recommendations for Other Services       Functional Status Assessment Patient has had a recent decline in their functional status and demonstrates the ability to make significant improvements in function in a reasonable and predictable amount of time.     Precautions / Restrictions Precautions Precautions: Fall;Knee Required Braces or Orthoses: Knee Immobilizer - Right Restrictions Weight Bearing Restrictions: Yes RLE Weight Bearing: Weight bearing as tolerated      Mobility  Bed Mobility Overal bed mobility: Needs Assistance Bed Mobility: Supine to Sit     Supine to sit: Min guard, HOB elevated     General bed mobility  comments: assist for leg as coming to EOB    Transfers Overall transfer level: Needs assistance Equipment used: Rolling walker (2 wheels) Transfers: Sit to/from Stand Sit to Stand: Min guard           General transfer comment: assist for balance and cues for R LE management in knee immobilizer    Ambulation/Gait Ambulation/Gait assistance: Min assist Gait Distance (Feet): 20 Feet Assistive device: Rolling walker (2 wheels) Gait Pattern/deviations: Step-to pattern, Step-through pattern, Decreased stride length, Decreased stance time - right, Antalgic       General Gait Details: cues for sequence, weight bearing and walker management; to bathroom to toilet, then to sink then to recliner  Stairs            Wheelchair Mobility    Modified Rankin (Stroke Patients Only)       Balance Overall balance assessment: Mild deficits observed, not formally tested                                           Pertinent Vitals/Pain Pain Assessment Pain Assessment: 0-10 Pain Score: 7  Pain Location: R knee Pain Descriptors / Indicators: Aching Pain Intervention(s): Monitored during session, Repositioned, Premedicated before session    Home Living Family/patient expects to be discharged to:: Private residence Living Arrangements: Spouse/significant other Available Help at Discharge: Family;Available 24 hours/day Type of Home: House Home Access: Stairs to enter Entrance Stairs-Rails: None Entrance Stairs-Number of Steps: 2 Alternate Level Stairs-Number of Steps: 14 Home Layout: Two level Home Equipment: Conservation officer, nature (2 wheels);BSC/3in1  Prior Function Prior Level of Function : Independent/Modified Independent;Driving                     Hand Dominance   Dominant Hand: Right    Extremity/Trunk Assessment   Upper Extremity Assessment Upper Extremity Assessment: Overall WFL for tasks assessed    Lower Extremity Assessment Lower  Extremity Assessment: RLE deficits/detail RLE Deficits / Details: knee flexion to about 40 in supine, lifts leg with slight quad lag       Communication   Communication: No difficulties  Cognition Arousal/Alertness: Awake/alert Behavior During Therapy: WFL for tasks assessed/performed Overall Cognitive Status: Within Functional Limits for tasks assessed                                          General Comments General comments (skin integrity, edema, etc.): patient with nausea, then vomited once in chair; RN in to deliver meds    Exercises Total Joint Exercises Ankle Circles/Pumps: AROM, 5 reps, Both, Supine Quad Sets: AROM, 5 reps, Right, Supine Heel Slides: AROM, 5 reps, Right, Supine Hip ABduction/ADduction: AROM, 5 reps, Right, Supine Straight Leg Raises: AROM, 5 reps, Supine   Assessment/Plan    PT Assessment Patient needs continued PT services  PT Problem List Decreased strength;Decreased range of motion;Decreased mobility;Decreased balance;Cardiopulmonary status limiting activity       PT Treatment Interventions DME instruction;Functional mobility training;Balance training;Patient/family education;Therapeutic activities;Gait training;Stair training;Therapeutic exercise    PT Goals (Current goals can be found in the Care Plan section)  Acute Rehab PT Goals Patient Stated Goal: return to independent PT Goal Formulation: With patient Time For Goal Achievement: 07/29/22 Potential to Achieve Goals: Good    Frequency 7X/week     Co-evaluation               AM-PAC PT "6 Clicks" Mobility  Outcome Measure Help needed turning from your back to your side while in a flat bed without using bedrails?: None Help needed moving from lying on your back to sitting on the side of a flat bed without using bedrails?: A Little Help needed moving to and from a bed to a chair (including a wheelchair)?: A Little Help needed standing up from a chair using your  arms (e.g., wheelchair or bedside chair)?: A Little Help needed to walk in hospital room?: A Little Help needed climbing 3-5 steps with a railing? : Total 6 Click Score: 17    End of Session Equipment Utilized During Treatment: Gait belt;Right knee immobilizer Activity Tolerance: Other (comment) (limited by N&V) Patient left: in chair;with call bell/phone within reach   PT Visit Diagnosis: Difficulty in walking, not elsewhere classified (R26.2);Pain Pain - Right/Left: Right Pain - part of body: Knee    Time: 6433-2951 PT Time Calculation (min) (ACUTE ONLY): 33 min   Charges:   PT Evaluation $PT Eval Low Complexity: 1 Low PT Treatments $Gait Training: 8-22 mins        Magda Kiel, PT Acute Rehabilitation Services Office:[phone] 07/24/2022   Reginia Naas 07/24/2022, 4:46 PM

## 2022-07-24 NOTE — H&P (Addendum)
TOTAL KNEE ADMISSION H&P  Patient is being admitted for right total knee arthroplasty.  Subjective:  Chief Complaint:right knee pain.  HPI: [name], 77 y.o. male, has a history of pain and functional disability in the right knee due to arthritis and has failed non-surgical conservative treatments for greater than 12 weeks to includeNSAID's and/or analgesics, corticosteriod injections, viscosupplementation injections, flexibility and strengthening excercises, and activity modification.  Onset of symptoms was gradual, starting >10 years ago with gradually worsening course since that time. The patient noted no past surgery on the right knee(s).  Patient currently rates pain in the right knee(s) at 8 out of 10 with activity. Patient has night pain, worsening of pain with activity and weight bearing, pain that interferes with activities of daily living, pain with passive range of motion, crepitus, and joint swelling.  Patient has evidence of subchondral sclerosis and joint space narrowing by imaging studies. This patient has had  a good result with his left shoulder replacement.  His knee arthritis interferes with activities of daily living and he is ready to proceed with knee replacement.  No personal or family history of DVT or pulmonary embolism . There is no active infection.  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   Skin tags, multiple acquired 07/07/2019   LUQ pain 07/07/2019   OA (osteoarthritis) of knee 03/16/2019   Ventral hernia s/p lap repair with mesh 06/03/2018 06/03/2018   Hemorrhoids 08/22/2016   Anemia 03/16/2016   Right shoulder pain 02/26/2016   Abnormal CT scan, gastrointestinal tract suspect angioedema    Abdominal pain    Hyperglycemia 08/21/2013   Low back pain 10/10/2012   Hyperlipidemia    Hypertension, essential     Obesity (BMI 30.0-34.9)    Past Medical History:  Diagnosis Date    Abnormal CT scan, gastrointestinal tract suspect angioedema    Anemia 03/16/2016   IRON   Anxiety    Arthritis    Back pain 10/10/2012   Fatty liver 2005   seen on 2005 ultrasound,05/2015 CT.    Hemorrhoids 08/22/2016   Hemorrhoids, internal, with bleeding 2005   internal and external. 2005 band ligation (dr Ferdinand Lango in [facility])   Hyperglycemia 08/21/2013   Hyperlipidemia    Hypertension    Nocturia 08/21/2013   Obesity (BMI 30.0-34.9)    Osteoporosis    Pneumonia    as a child   Rectal bleeding 07/30/2014   Renal cyst    SBO (small bowel obstruction) ([place])    Tubular adenoma of colon before 2005, 2012, 08/2014   Unspecified constipation 02/06/2014   Vitamin D deficiency     Past Surgical History:  Procedure Laterality Date   COLONOSCOPY  before 2005, 2012, 2013, 08/2014.    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   Procedure: LEFT ANATOMIC SHOULDER REPLACEMENT;  Surgeon: Meredith Pel, MD;  Location: [facility];  Service: Orthopedics;  Laterality: Left;   VENTRAL HERNIA REPAIR N/A 06/03/2018  Procedure: LAPAROSCOPIC VENTRAL WALL  HERNIA  REPAIR WITH MESH  ERAS PATHWAY;  Surgeon: Michael Boston, MD;  Location: [facility];  Service: General;  Laterality: N/A;    Current Facility-Administered Medications  Medication Dose Route Frequency Provider Last Rate Last Admin   ceFAZolin (ANCEF) IVPB 2g/100 mL premix  2 g Intravenous On Call to Percival, Gerrianne Scale, PA-C       lactated ringers infusion   Intravenous Continuous Roderic Palau, MD       povidone-iodine (BETADINE) 7.5 % scrub   Topical Once  Magnant, Charles L, PA-C       povidone-iodine 10 % swab 2 Application  2 Application Topical Once Magnant, Charles L, PA-C       tranexamic acid (CYKLOKAPRON) 2,000 mg in sodium chloride 0.9 % 50 mL Topical Application  6,237 mg Topical To OR Marlou Sa, Tonna Corner, MD       tranexamic acid (CYKLOKAPRON) IVPB 1,000 mg  1,000 mg Intravenous To OR Magnant, Charles L, PA-C       No Known Allergies  Social History   Tobacco Use   Smoking status: Never   Smokeless tobacco: Never  Substance Use Topics   Alcohol use: Not Currently    Comment: Occassionally    Family History  Problem Relation Age of Onset   Stroke Mother    Aneurysm Mother        brain   Arthritis Father    Liver cancer Father        liver, atomic testing in [place]   Gallbladder disease Father    Hyperlipidemia Sister    Hypertension Sister    Obesity Sister    Diabetes Sister    Diabetes Brother    Hyperlipidemia Sister    Hypertension Sister    Obesity Sister    Diabetes Sister    Aneurysm Brother        brain   COPD Brother    Gallbladder disease Sister        x4   Colon cancer Neg Hx    Colon polyps Neg Hx    Esophageal cancer Neg Hx    Rectal cancer Neg Hx    Stomach cancer Neg Hx    Prostate cancer Neg Hx    CAD Neg Hx      Review of Systems  Musculoskeletal:  Positive for arthralgias.  All other systems reviewed and are negative.   Objective:  Physical Exam Vitals reviewed.  HENT:     Head: Normocephalic.     Nose: Nose normal.     Mouth/Throat:     Mouth: Mucous membranes are moist.  Eyes:     Pupils: Pupils are equal, round, and reactive to light.  Cardiovascular:     Rate and Rhythm: Normal rate.     Pulses: Normal pulses.  Pulmonary:     Effort: Pulmonary effort is normal.  Abdominal:     General: Abdomen is flat.  Musculoskeletal:     Cervical back: Normal range of motion.  Skin:    General: Skin is warm.     Capillary Refill: Capillary refill takes less than 2 seconds.   Neurological:     General: No focal deficit present.     Mental Status: He is alert.  Psychiatric:        Mood and Affect: Mood normal.   Right knee range of motion is 5-1 15.  Pedal pulses palpable.  Skin intact in the right knee region.  Ankle dorsiflexion intact.  Effusion is present.  Medial joint line tenderness is present. Vital signs in last 24 hours: Temp:  [98.3 F (36.8 C)] 98.3 F (36.8 C) (01/25 0551) Pulse Rate:  [72] 72 (01/25 0551) Resp:  [18] 18 (01/25 0551) BP: (143)/(85) 143/85 (01/25 0551) SpO2:  [99 %] 99 % (01/25 0551) Weight:  [54.9 kg] 54.9 kg (01/25 0551)  Labs:   Estimated body mass index is 16.88 kg/m as calculated from the following:   Height as of this encounter: '5\' 11"'$  (1.803 m).   Weight as of this encounter: 54.9 kg.   Imaging Review Plain radiographs demonstrate severe degenerative joint disease of the right knee(s). The overall alignment ismild varus. The bone quality appears to be good for age and reported activity level.      Assessment/Plan:  End stage arthritis, right knee   The patient history, physical examination, clinical judgment of the provider and imaging studies are consistent with end stage degenerative joint disease of the right knee(s) and total knee arthroplasty is deemed medically necessary. The treatment options including medical management, injection therapy arthroscopy and arthroplasty were discussed at length. The risks and benefits of total knee arthroplasty were presented and reviewed. The risks due to aseptic loosening, infection, stiffness, patella tracking problems, thromboembolic complications and other imponderables were discussed. The patient acknowledged the explanation, agreed to proceed with the plan and consent was signed. Patient is being admitted for inpatient treatment for surgery, pain control, PT, OT, prophylactic antibiotics, VTE prophylaxis, progressive ambulation and ADL's and discharge planning. The  patient is planning to be discharged home with home health services     Patient's anticipated LOS is less than 2 midnights, meeting these requirements: - Younger than 49 - Lives within 1 hour of care - Has a competent adult at home to recover with post-op recover - NO history of  - Chronic pain requiring opiods  - Diabetes  - Coronary Artery Disease  - Heart failure  - Heart attack  - Stroke  - DVT/VTE  - Cardiac arrhythmia  - Respiratory Failure/COPD  - Renal failure  - Anemia  - Advanced Liver disease

## 2022-07-24 NOTE — Op Note (Signed)
[name], [name] MEDICAL RECORD NO: [id number] ACCOUNT NO: [id number] DATE OF BIRTH: 10-31-1945 FACILITY: [facility] LOCATION: [facility] PHYSICIAN: Yetta Barre. Marlou Sa, MD  Operative Report   DATE OF PROCEDURE: 07/24/2022  PREOPERATIVE DIAGNOSIS:  Right knee arthritis.  POSTOPERATIVE DIAGNOSIS:  Right knee arthritis.  PROCEDURE:  Right total knee replacement using press-fit components [employer] including cruciate retaining size 9 femur with size G press fit spiked keel tibia, 35 mm press-fit trabecular metal patella and medial congruent 10 mm polyethylene insert.  SURGEON:  Yetta Barre. Marlou Sa, MD  ASSISTANT:  Annie Main, PA.  INDICATIONS:  This is a 77 year old patient with end-stage right knee arthritis, who presents for operative management after explanation of risks and benefits.  DESCRIPTION OF PROCEDURE:  The patient was brought to the operating room where general endotracheal anesthesia was induced.  Preoperative antibiotics administered.  Timeout was called.  Right leg was prescrubbed with alcohol and Betadine, allowed to air  dry, prepped with DuraPrep solution and draped in sterile manner.  Ioban used to cover the operative field.  Leg was elevated and exsanguinated with the Esmarch wrap.  Tourniquet was inflated.  He had about 10-12 degree flexion contracture, but about 120  of flexion.  Anterior approach to the knee was made after calling timeout.  Skin and subcutaneous tissue were sharply divided.  IrriSept solution utilized.  Median parapatellar arthrotomy was made and marked #1 Vicryl suture.  Fat pad was partially  excised.  Lateral patellofemoral ligament was released.  Medial soft tissue dissection was performed proportional to the patient's preoperative mild varus deformity.  Soft tissue removed from the anterior distal femur.  Patella was everted.  Anterior  horn lateral meniscus released.  Severe tricompartmental arthritis was present most severe in the medial compartment.   Osteophytes were removed.  Intramedullary alignment was then used to make a cut off of the medial tibial plateau.  Initially a 10 mm  cut was made off the least affected lateral tibial plateau, which was then measured at 7 mm.  Two more millimeters was then resected with the collaterals and posterior neurovascular structures protected.  Bone quality was excellent.  This cut was made  perpendicular to the mechanical axis of the tibia.  Attention then directed towards the femur.  Cut in 5 degrees of valgus was then made using intramedullary alignment.  A 10 mm cut was made and then later revised 2 more millimeters in order to allow for  a 10 mm spacer to achieve symmetric extension gap.  At this time, the femur was then cut to a size 9 in 3 degrees of external rotation, which gave symmetric flexion and extension gap. The tibial tray was then placed.  Then, trial components, placed on  the femur and tibia with a 10 mm insert present.  Patella was then cut down from 24 to 14 mm and trial patella was placed.  The patient had full extension and full flexion with good patellar tracking using no thumbs technique.  This was enhanced with a  small lateral release, which gave optimal tracking.  Trial components were removed.  After completing preparation, both on the femur and the tibia.  At this time, 3 liters of pulsatile irrigating solution was utilized after tapping the keel punch on the  tibia and drilling the lugs on the femur. The tibial plate was aligned with the medial third of the tibial tubercle.  Next 3 liters of irrigating solution utilized. The capsule was then anesthetized using a combination of Marcaine,  saline and Exparel.   Next, a TXA sponge was allowed to sit along with IrriSept solution into the arthrotomy for 3 minutes.  These were removed, and then vancomycin powder placed into the intramedullary canal of the tibia.  The femur was placed and the insert was placed.   Next, the patella was placed.   The same stability parameters were maintained.  Tourniquet released.  Bleeding points encountered controlled using electrocautery.  Pouring irrigation then utilized at this time x4 liters.  The arthrotomy was then closed  over bolster using #1 Vicryl suture.  Final to prior arthrotomy closure the knee joint was again irrigated with IrriSept solution and which was suctioned out and then vancomycin powder placed.  Then, the arthrotomy was closed completely using #1 Vicryl  suture.  Injection of Marcaine, morphine, clonidine then injected into the knee for postoperative pain relief.  Next IrriSept solution again utilized followed by vancomycin powder and then the layers were closed using 0 Vicryl suture, 2-0 Vicryl suture,  and 3-0 Monocryl with Steri-Strips and Aquacel dressing applied along with Ace wrap, iceman and knee immobilizer.  The patient tolerated the procedure well without immediate complication and transferred to recovery room in stable condition.  Luke's  assistance was required at all times for retraction, opening, closing, mobilization of tissue.  His assistance was a medical necessity.   PUS D: 07/24/2022 11:21:42 am T: 07/24/2022 11:38:00 am  JOB: [id number]/ [id number]

## 2022-07-25 DIAGNOSIS — Z96612 Presence of left artificial shoulder joint: Secondary | ICD-10-CM | POA: Diagnosis not present

## 2022-07-25 DIAGNOSIS — I10 Essential (primary) hypertension: Secondary | ICD-10-CM | POA: Diagnosis not present

## 2022-07-25 DIAGNOSIS — M1711 Unilateral primary osteoarthritis, right knee: Secondary | ICD-10-CM | POA: Diagnosis not present

## 2022-07-25 DIAGNOSIS — Z79899 Other long term (current) drug therapy: Secondary | ICD-10-CM | POA: Diagnosis not present

## 2022-07-25 MED ORDER — ASPIRIN 81 MG PO CHEW: 81.0000 mg | CHEWABLE_TABLET | Freq: Two times a day (BID) | ORAL | 0 refills | Status: AC

## 2022-07-25 MED ORDER — OXYCODONE HCL 5 MG PO TABS: 5.0000 mg | ORAL_TABLET | ORAL | 0 refills | Status: DC | PRN

## 2022-07-25 MED ORDER — GABAPENTIN 300 MG PO CAPS: 300.0000 mg | ORAL_CAPSULE | Freq: Three times a day (TID) | ORAL | 0 refills | Status: DC

## 2022-07-25 MED ORDER — METHOCARBAMOL 500 MG PO TABS: 500.0000 mg | ORAL_TABLET | Freq: Three times a day (TID) | ORAL | 0 refills | Status: DC | PRN

## 2022-07-25 MED ORDER — DOCUSATE SODIUM 100 MG PO CAPS: 100.0000 mg | ORAL_CAPSULE | Freq: Two times a day (BID) | ORAL | 0 refills | Status: DC

## 2022-07-25 NOTE — Plan of Care (Signed)
Problem: Education: Goal: Knowledge of the prescribed therapeutic regimen will improve 07/25/2022 1523 by Kieth Brightly, LPN Outcome: Completed/Met 07/25/2022 1305 by Kieth Brightly, LPN Outcome: Progressing Goal: Individualized Educational Video(s) 07/25/2022 1523 by Kieth Brightly, LPN Outcome: Completed/Met 07/25/2022 1305 by Kieth Brightly, LPN Outcome: Progressing   Problem: Activity: Goal: Ability to avoid complications of mobility impairment will improve 07/25/2022 1523 by Kieth Brightly, LPN Outcome: Completed/Met 07/25/2022 1305 by Kieth Brightly, LPN Outcome: Progressing Goal: Range of joint motion will improve 07/25/2022 1523 by Kieth Brightly, LPN Outcome: Completed/Met 07/25/2022 1305 by Kieth Brightly, LPN Outcome: Progressing   Problem: Clinical Measurements: Goal: Postoperative complications will be avoided or minimized 07/25/2022 1523 by Kieth Brightly, LPN Outcome: Completed/Met 07/25/2022 1305 by Kieth Brightly, LPN Outcome: Progressing   Problem: Pain Management: Goal: Pain level will decrease with appropriate interventions 07/25/2022 1523 by Kieth Brightly, LPN Outcome: Completed/Met 07/25/2022 1305 by Kieth Brightly, LPN Outcome: Progressing   Problem: Skin Integrity: Goal: Will show signs of wound healing 07/25/2022 1523 by Kieth Brightly, LPN Outcome: Completed/Met 07/25/2022 1305 by Kieth Brightly, LPN Outcome: Progressing   Problem: Education: Goal: Knowledge of General Education information will improve Description: Including pain rating scale, medication(s)/side effects and non-pharmacologic comfort measures 07/25/2022 1523 by Kieth Brightly, LPN Outcome: Completed/Met 07/25/2022 1305 by Kieth Brightly, LPN Outcome: Progressing   Problem: Health Behavior/Discharge Planning: Goal: Ability to manage health-related needs will improve 07/25/2022 1523 by Kieth Brightly,  LPN Outcome: Completed/Met 07/25/2022 1305 by Kieth Brightly, LPN Outcome: Progressing   Problem: Clinical Measurements: Goal: Ability to maintain clinical measurements within normal limits will improve 07/25/2022 1523 by Kieth Brightly, LPN Outcome: Completed/Met 07/25/2022 1305 by Kieth Brightly, LPN Outcome: Progressing Goal: Will remain free from infection 07/25/2022 1523 by Kieth Brightly, LPN Outcome: Completed/Met 07/25/2022 1305 by Kieth Brightly, LPN Outcome: Progressing Goal: Diagnostic test results will improve 07/25/2022 1523 by Kieth Brightly, LPN Outcome: Completed/Met 07/25/2022 1305 by Kieth Brightly, LPN Outcome: Progressing Goal: Respiratory complications will improve 07/25/2022 1523 by Kieth Brightly, LPN Outcome: Completed/Met 07/25/2022 1305 by Kieth Brightly, LPN Outcome: Progressing Goal: Cardiovascular complication will be avoided 07/25/2022 1523 by Kieth Brightly, LPN Outcome: Completed/Met 07/25/2022 1305 by Kieth Brightly, LPN Outcome: Progressing   Problem: Activity: Goal: Risk for activity intolerance will decrease 07/25/2022 1523 by Kieth Brightly, LPN Outcome: Completed/Met 07/25/2022 1305 by Kieth Brightly, LPN Outcome: Progressing   Problem: Nutrition: Goal: Adequate nutrition will be maintained 07/25/2022 1523 by Kieth Brightly, LPN Outcome: Completed/Met 07/25/2022 1305 by Kieth Brightly, LPN Outcome: Progressing   Problem: Coping: Goal: Level of anxiety will decrease 07/25/2022 1523 by Kieth Brightly, LPN Outcome: Completed/Met 07/25/2022 1305 by Kieth Brightly, LPN Outcome: Progressing   Problem: Elimination: Goal: Will not experience complications related to bowel motility 07/25/2022 1523 by Kieth Brightly, LPN Outcome: Completed/Met 07/25/2022 1305 by Kieth Brightly, LPN Outcome: Progressing Goal: Will not experience complications related to  urinary retention 07/25/2022 1523 by Kieth Brightly, LPN Outcome: Completed/Met 07/25/2022 1305 by Kieth Brightly, LPN Outcome: Progressing   Problem: Pain Managment: Goal: General experience of comfort will improve 07/25/2022 1523 by Kieth Brightly, LPN Outcome: Completed/Met 07/25/2022 1305 by Kieth Brightly, LPN Outcome: Progressing   Problem: Safety: Goal: Ability to remain free from injury will improve 07/25/2022 1523 by Kieth Brightly, LPN Outcome: Completed/Met 07/25/2022  1305 by Kieth Brightly, LPN Outcome: Progressing   Problem: Skin Integrity: Goal: Risk for impaired skin integrity will decrease 07/25/2022 1523 by Kieth Brightly, LPN Outcome: Completed/Met 07/25/2022 1305 by Kieth Brightly, LPN Outcome: Progressing

## 2022-07-25 NOTE — Progress Notes (Signed)
Physical Therapy Treatment Patient Details Name: [name] MRN: [id number] DOB: 1946-04-30 Today's Date: 07/25/2022   History of Present Illness Patient is a 77 y/o male admitted due to R knee OA now s/p TKA.  PMH positive for L shoulder replacement, HTN, HLD, ventral hernia repair.    PT Comments    Pt progressing well towards PT goals, ambulating in hallway with use of RW and navigating a flight of steps. Pt requires cues for form/safety throughout, but improves with continued practice. PT administered, reviewed, and practiced TKA exercises with pt. Pt and wife with no further questions, appropriate to d/c from a PT perspective.      Recommendations for follow up therapy are one component of a multi-disciplinary discharge planning process, led by the attending physician.  Recommendations may be updated based on patient status, additional functional criteria and insurance authorization.  Follow Up Recommendations  Follow physician's recommendations for discharge plan and follow up therapies     Assistance Recommended at Discharge Intermittent Supervision/Assistance  Patient can return home with the following A little help with walking and/or transfers;A little help with bathing/dressing/bathroom;Assist for transportation;Help with stairs or ramp for entrance   Equipment Recommendations  None recommended by PT    Recommendations for Other Services       Precautions / Restrictions Precautions Precautions: Fall;Knee Required Braces or Orthoses: Knee Immobilizer - Right Knee Immobilizer - Right: Discontinue once straight leg raise with < 10 degree lag (not used this session) Restrictions Weight Bearing Restrictions: Yes RLE Weight Bearing: Weight bearing as tolerated     Mobility  Bed Mobility Overal bed mobility: Needs Assistance Bed Mobility: Supine to Sit, Sit to Supine     Supine to sit: Supervision, HOB elevated Sit to supine: Supervision, HOB elevated   General  bed mobility comments: incr time    Transfers Overall transfer level: Needs assistance Equipment used: Rolling walker (2 wheels) Transfers: Sit to/from Stand Sit to Stand: Supervision           General transfer comment: for safety, cues for hand placement when rising/sitting. STS x3,from EOB and recliner in hallway x2    Ambulation/Gait Ambulation/Gait assistance: Supervision Gait Distance (Feet): 120 Feet (x2) Assistive device: Rolling walker (2 wheels) Gait Pattern/deviations: Step-to pattern, Step-through pattern, Decreased stride length, Decreased stance time - right, Antalgic Gait velocity: decr     General Gait Details: cues for sequecning, placement in RW, upright posture, and taking his time   Stairs Stairs: Yes Stairs assistance: Min guard Stair Management: One rail Left, Step to pattern, Forwards, With walker Number of Stairs: 10 General stair comments: x10 steps with L rail, cues for bilat hand placement in railing, sequencing ("up with the good, down with the bad"), and taking his time. x2 steps with use of RW and PT bracing RW, cues for sequencing and caregiver role   Wheelchair Mobility    Modified Rankin (Stroke Patients Only)       Balance Overall balance assessment: Mild deficits observed, not formally tested                                          Cognition Arousal/Alertness: Awake/alert Behavior During Therapy: WFL for tasks assessed/performed Overall Cognitive Status: Within Functional Limits for tasks assessed  Exercises Total Joint Exercises Ankle Circles/Pumps: AROM, 5 reps, Both, Supine Quad Sets: AROM, 5 reps, Right, Supine Heel Slides: AROM, 5 reps, Right, Supine Hip ABduction/ADduction: AROM, 5 reps, Right, Supine Straight Leg Raises: AROM, 5 reps, Supine Goniometric ROM: knee aarom 5-50 deg, limited by pain and fatigue    General Comments         Pertinent Vitals/Pain Pain Assessment Pain Assessment: 0-10 Pain Score: 7  Pain Location: R knee Pain Descriptors / Indicators: Aching, Sore Pain Intervention(s): Monitored during session, Limited activity within patient's tolerance, Repositioned    Home Living                          Prior Function            PT Goals (current goals can now be found in the care plan section) Acute Rehab PT Goals Patient Stated Goal: return to independent PT Goal Formulation: With patient Time For Goal Achievement: 07/29/22 Potential to Achieve Goals: Good Progress towards PT goals: Progressing toward goals    Frequency    7X/week      PT Plan Current plan remains appropriate    Co-evaluation              AM-PAC PT "6 Clicks" Mobility   Outcome Measure  Help needed turning from your back to your side while in a flat bed without using bedrails?: None Help needed moving from lying on your back to sitting on the side of a flat bed without using bedrails?: None Help needed moving to and from a bed to a chair (including a wheelchair)?: A Little Help needed standing up from a chair using your arms (e.g., wheelchair or bedside chair)?: A Little Help needed to walk in hospital room?: A Little Help needed climbing 3-5 steps with a railing? : A Little 6 Click Score: 20    End of Session   Activity Tolerance: Patient tolerated treatment well Patient left: in bed;with call bell/phone within reach;with family/visitor present Nurse Communication: Mobility status PT Visit Diagnosis: Difficulty in walking, not elsewhere classified (R26.2);Pain Pain - Right/Left: Right Pain - part of body: Knee     Time: 1200-1232 PT Time Calculation (min) (ACUTE ONLY): 32 min  Charges:  $Gait Training: 23-37 mins                     Stacie Glaze, PT DPT Acute Rehabilitation Services Pager [phone]  Office [phone]    [place] 07/25/2022, 1:56 PM

## 2022-07-25 NOTE — Plan of Care (Signed)
  Problem: Education: ?Goal: Knowledge of the prescribed therapeutic regimen will improve ?Outcome: Progressing ?Goal: Individualized Educational Video(s) ?Outcome: Progressing ?  ?Problem: Activity: ?Goal: Ability to avoid complications of mobility impairment will improve ?Outcome: Progressing ?Goal: Range of joint motion will improve ?Outcome: Progressing ?  ?Problem: Clinical Measurements: ?Goal: Postoperative complications will be avoided or minimized ?Outcome: Progressing ?  ?Problem: Pain Management: ?Goal: Pain level will decrease with appropriate interventions ?Outcome: Progressing ?  ?Problem: Skin Integrity: ?Goal: Will show signs of wound healing ?Outcome: Progressing ?  ?Problem: Education: ?Goal: Knowledge of General Education information will improve ?Description: Including pain rating scale, medication(s)/side effects and non-pharmacologic comfort measures ?Outcome: Progressing ?  ?Problem: Health Behavior/Discharge Planning: ?Goal: Ability to manage health-related needs will improve ?Outcome: Progressing ?  ?Problem: Clinical Measurements: ?Goal: Ability to maintain clinical measurements within normal limits will improve ?Outcome: Progressing ?Goal: Will remain free from infection ?Outcome: Progressing ?Goal: Diagnostic test results will improve ?Outcome: Progressing ?Goal: Respiratory complications will improve ?Outcome: Progressing ?Goal: Cardiovascular complication will be avoided ?Outcome: Progressing ?  ?Problem: Activity: ?Goal: Risk for activity intolerance will decrease ?Outcome: Progressing ?  ?Problem: Nutrition: ?Goal: Adequate nutrition will be maintained ?Outcome: Progressing ?  ?Problem: Coping: ?Goal: Level of anxiety will decrease ?Outcome: Progressing ?  ?Problem: Elimination: ?Goal: Will not experience complications related to bowel motility ?Outcome: Progressing ?Goal: Will not experience complications related to urinary retention ?Outcome: Progressing ?  ?Problem: Pain  Managment: ?Goal: General experience of comfort will improve ?Outcome: Progressing ?  ?Problem: Safety: ?Goal: Ability to remain free from injury will improve ?Outcome: Progressing ?  ?Problem: Skin Integrity: ?Goal: Risk for impaired skin integrity will decrease ?Outcome: Progressing ?  ?

## 2022-07-27 ENCOUNTER — Encounter (HOSPITAL_COMMUNITY): Payer: Self-pay | Admitting: Orthopedic Surgery

## 2022-07-27 DIAGNOSIS — D649 Anemia, unspecified: Secondary | ICD-10-CM | POA: Diagnosis not present

## 2022-07-27 DIAGNOSIS — K760 Fatty (change of) liver, not elsewhere classified: Secondary | ICD-10-CM | POA: Diagnosis not present

## 2022-07-27 DIAGNOSIS — Z6830 Body mass index (BMI) 30.0-30.9, adult: Secondary | ICD-10-CM | POA: Diagnosis not present

## 2022-07-27 DIAGNOSIS — Z9181 History of falling: Secondary | ICD-10-CM | POA: Diagnosis not present

## 2022-07-27 DIAGNOSIS — I10 Essential (primary) hypertension: Secondary | ICD-10-CM | POA: Diagnosis not present

## 2022-07-27 DIAGNOSIS — E669 Obesity, unspecified: Secondary | ICD-10-CM | POA: Diagnosis not present

## 2022-07-27 DIAGNOSIS — M1711 Unilateral primary osteoarthritis, right knee: Secondary | ICD-10-CM

## 2022-07-27 DIAGNOSIS — Z86010 Personal history of colonic polyps: Secondary | ICD-10-CM | POA: Diagnosis not present

## 2022-07-27 DIAGNOSIS — Z8701 Personal history of pneumonia (recurrent): Secondary | ICD-10-CM | POA: Diagnosis not present

## 2022-07-27 DIAGNOSIS — F419 Anxiety disorder, unspecified: Secondary | ICD-10-CM | POA: Diagnosis not present

## 2022-07-27 DIAGNOSIS — Z7982 Long term (current) use of aspirin: Secondary | ICD-10-CM | POA: Diagnosis not present

## 2022-07-27 DIAGNOSIS — M810 Age-related osteoporosis without current pathological fracture: Secondary | ICD-10-CM | POA: Diagnosis not present

## 2022-07-27 DIAGNOSIS — K56609 Unspecified intestinal obstruction, unspecified as to partial versus complete obstruction: Secondary | ICD-10-CM | POA: Diagnosis not present

## 2022-07-27 DIAGNOSIS — Z471 Aftercare following joint replacement surgery: Secondary | ICD-10-CM | POA: Diagnosis not present

## 2022-07-27 DIAGNOSIS — Z96651 Presence of right artificial knee joint: Secondary | ICD-10-CM | POA: Diagnosis not present

## 2022-07-27 DIAGNOSIS — Z96612 Presence of left artificial shoulder joint: Secondary | ICD-10-CM | POA: Diagnosis not present

## 2022-07-27 DIAGNOSIS — E785 Hyperlipidemia, unspecified: Secondary | ICD-10-CM | POA: Diagnosis not present

## 2022-07-28 ENCOUNTER — Other Ambulatory Visit: Payer: Self-pay | Admitting: *Deleted

## 2022-07-28 ENCOUNTER — Telehealth: Payer: Self-pay | Admitting: *Deleted

## 2022-07-28 DIAGNOSIS — Z96651 Presence of right artificial knee joint: Secondary | ICD-10-CM

## 2022-07-28 NOTE — Telephone Encounter (Signed)
Ortho bundle D/C call completed. Patient doing well at home over the weekend.

## 2022-07-29 DIAGNOSIS — I10 Essential (primary) hypertension: Secondary | ICD-10-CM | POA: Diagnosis not present

## 2022-07-29 DIAGNOSIS — F419 Anxiety disorder, unspecified: Secondary | ICD-10-CM | POA: Diagnosis not present

## 2022-07-29 DIAGNOSIS — E785 Hyperlipidemia, unspecified: Secondary | ICD-10-CM | POA: Diagnosis not present

## 2022-07-29 DIAGNOSIS — M810 Age-related osteoporosis without current pathological fracture: Secondary | ICD-10-CM | POA: Diagnosis not present

## 2022-07-29 DIAGNOSIS — D649 Anemia, unspecified: Secondary | ICD-10-CM | POA: Diagnosis not present

## 2022-07-29 DIAGNOSIS — Z471 Aftercare following joint replacement surgery: Secondary | ICD-10-CM | POA: Diagnosis not present

## 2022-07-31 ENCOUNTER — Telehealth: Payer: Self-pay | Admitting: *Deleted

## 2022-07-31 DIAGNOSIS — I10 Essential (primary) hypertension: Secondary | ICD-10-CM | POA: Diagnosis not present

## 2022-07-31 DIAGNOSIS — F419 Anxiety disorder, unspecified: Secondary | ICD-10-CM | POA: Diagnosis not present

## 2022-07-31 DIAGNOSIS — M810 Age-related osteoporosis without current pathological fracture: Secondary | ICD-10-CM | POA: Diagnosis not present

## 2022-07-31 DIAGNOSIS — Z471 Aftercare following joint replacement surgery: Secondary | ICD-10-CM | POA: Diagnosis not present

## 2022-07-31 DIAGNOSIS — D649 Anemia, unspecified: Secondary | ICD-10-CM | POA: Diagnosis not present

## 2022-07-31 DIAGNOSIS — E785 Hyperlipidemia, unspecified: Secondary | ICD-10-CM | POA: Diagnosis not present

## 2022-07-31 NOTE — Telephone Encounter (Signed)
Ortho bundle 7 day call completed.

## 2022-07-31 NOTE — Discharge Summary (Signed)
Physician Discharge Summary      Patient ID: [name] MRN: [id number] DOB/AGE: Sep 23, 1945 77 y.o.  Admit date: 07/24/2022 Discharge date: 07/25/2022  Admission Diagnoses:  Principal Problem:   OA (osteoarthritis) of knee Active Problems:   S/P total knee arthroplasty, right   Arthritis of right knee   Discharge Diagnoses:  Same  Surgeries: Procedure(s): RIGHT TOTAL KNEE ARTHROPLASTY on 07/24/2022   Consultants:   Discharged Condition: Stable  Hospital Course: [name] is an 77 y.o. male who was admitted 07/24/2022 with a chief complaint of right knee pain, and found to have a diagnosis of right knee osteoarthritis.  They were brought to the operating room on 07/24/2022 and underwent the above named procedures.  Pt awoke from anesthesia without complication and was transferred to the floor. On POD1, patient's pain was controlled.  He is able to ambulate well with physical therapy and do stairs.  He had no red flag symptoms.  He is able to perform straight leg raise with good quad strength.  Pain is controlled.  He was discharged home on POD 1..  Pt will f/u with Dr. Marlou Mueller in clinic in ~2 weeks.   Antibiotics given:  Anti-infectives (From admission, onward)    Start     Dose/Rate Route Frequency Ordered Stop   07/24/22 1600  ceFAZolin (ANCEF) IVPB 1 g/50 mL premix        1 g 100 mL/hr over 30 Minutes Intravenous Every 8 hours 07/24/22 1125 07/24/22 2133   07/24/22 0939  vancomycin (VANCOCIN) powder  Status:  Discontinued          As needed 07/24/22 0939 07/24/22 1045   07/24/22 0852  vancomycin (VANCOCIN) powder  Status:  Discontinued          As needed 07/24/22 0853 07/24/22 1045   07/24/22 0615  ceFAZolin (ANCEF) IVPB 2g/100 mL premix        2 g 200 mL/hr over 30 Minutes Intravenous On call to O.R. 07/24/22 3086 07/24/22 0735     .  Recent vital signs:  Vitals:   07/25/22 1008 07/25/22 1450  BP: 127/81 105/71  Pulse: 79 79  Resp:  17  Temp:  98.1 F (36.7  C)  SpO2:  100%    Recent laboratory studies:  Results for orders placed or performed during the hospital encounter of 07/21/22  Urine Culture   Specimen: Urine, Clean Catch  Result Value Ref Range   Specimen Description URINE, CLEAN CATCH    Special Requests NONE    Culture      NO GROWTH Performed at [facility], [place] [address] [place], [place] [address]    Report Status 07/22/2022 FINAL   Surgical pcr screen   Specimen: Nasal Mucosa; Nasal Swab  Result Value Ref Range   MRSA, PCR NEGATIVE NEGATIVE   Staphylococcus aureus NEGATIVE NEGATIVE  CBC  Result Value Ref Range   WBC 5.5 4.0 - 10.5 K/uL   RBC 4.59 4.22 - 5.81 MIL/uL   Hemoglobin 13.0 13.0 - 17.0 g/dL   HCT 39.3 39.0 - 52.0 %   MCV 85.6 80.0 - 100.0 fL   MCH 28.3 26.0 - 34.0 pg   MCHC 33.1 30.0 - 36.0 g/dL   RDW 12.7 11.5 - 15.5 %   Platelets 192 150 - 400 K/uL   nRBC 0.0 0.0 - 0.2 %  Urinalysis, Complete w Microscopic  Result Value Ref Range   Color, Urine YELLOW YELLOW   APPearance CLEAR CLEAR  Specific Gravity, Urine 1.010 1.005 - 1.030   pH 6.0 5.0 - 8.0   Glucose, UA NEGATIVE NEGATIVE mg/dL   Hgb urine dipstick NEGATIVE NEGATIVE   Bilirubin Urine NEGATIVE NEGATIVE   Ketones, ur NEGATIVE NEGATIVE mg/dL   Protein, ur NEGATIVE NEGATIVE mg/dL   Nitrite NEGATIVE NEGATIVE   Leukocytes,Ua NEGATIVE NEGATIVE   Squamous Epithelial / HPF 0-5 0 - 5 /HPF   WBC, UA 0-5 0 - 5 WBC/hpf   RBC / HPF NONE SEEN 0 - 5 RBC/hpf   Bacteria, UA RARE (A) NONE SEEN   Mucus PRESENT   Comprehensive metabolic panel per protocol  Result Value Ref Range   Sodium 139 135 - 145 mmol/L   Potassium 3.7 3.5 - 5.1 mmol/L   Chloride 106 98 - 111 mmol/L   CO2 25 22 - 32 mmol/L   Glucose, Bld 109 (H) 70 - 99 mg/dL   BUN 9 8 - 23 mg/dL   Creatinine, Ser 1.04 0.61 - 1.24 mg/dL   Calcium 9.3 8.9 - 10.3 mg/dL   Total Protein 7.1 6.5 - 8.1 g/dL   Albumin 4.1 3.5 - 5.0 g/dL   AST 23 15 - 41 U/L   ALT 24 0 - 44 U/L   Alkaline  Phosphatase 72 38 - 126 U/L   Total Bilirubin 0.7 0.3 - 1.2 mg/dL   GFR, Estimated >60 >60 mL/min   Anion gap 8 5 - 15    Discharge Medications:   Allergies as of 07/25/2022   No Known Allergies      Medication List     STOP taking these medications    traMADol 50 MG tablet Commonly known as: ULTRAM       TAKE these medications    acetaminophen 500 MG tablet Commonly known as: TYLENOL Take 1,000 mg by mouth every 6 (six) hours as needed for moderate pain.   ALPRAZolam 1 MG tablet Commonly known as: XANAX TAKE 1 TABLET (1 MG TOTAL) BY MOUTH AT BEDTIME AS NEEDED FOR ANXIETY.   amLODipine 10 MG tablet Commonly known as: NORVASC TAKE 1 TABLET DAILY   Anaspaz 0.125 MG Tbdp disintergrating tablet Generic drug: hyoscyamine Place 1 tablet (0.125 mg total) under the tongue every 4 (four) hours as needed for cramping.   aspirin 81 MG chewable tablet Chew 1 tablet (81 mg total) by mouth 2 (two) times daily.   Comirnaty syringe Generic drug: COVID-19 mRNA vaccine 2023-2024 Inject into the muscle.   docusate sodium 100 MG capsule Commonly known as: COLACE Take 1 capsule (100 mg total) by mouth 2 (two) times daily.   Fluad Quadrivalent 0.5 ML injection Generic drug: influenza vaccine adjuvanted Inject into the muscle.   gabapentin 300 MG capsule Commonly known as: NEURONTIN Take 1 capsule (300 mg total) by mouth 3 (three) times daily.   hydrocortisone 25 MG suppository Commonly known as: ANUSOL-HC Unwrap and place 1 suppository (25 mg total) rectally 2 (two) times daily. What changed:  when to take this reasons to take this   Livalo 2 MG Tabs Generic drug: Pitavastatin Calcium Take 1 tablet (2 mg total) by mouth daily.   losartan 50 MG tablet Commonly known as: COZAAR Take 1 tablet (50 mg total) by mouth daily.   meloxicam 15 MG tablet Commonly known as: MOBIC TAKE 1 TABLET (15 MG TOTAL) BY MOUTH DAILY.   methocarbamol 500 MG tablet Commonly known as:  ROBAXIN Take 1 tablet (500 mg total) by mouth every 8 (eight) hours as needed for  muscle spasms.   metoprolol succinate 50 MG 24 hr tablet Commonly known as: TOPROL-XL Take 1 tablet (50 mg total) by mouth daily. Take with or immediately following a meal.   MULTIVITAMIN ADULT PO Take 2 tablets by mouth daily.   oxyCODONE 5 MG immediate release tablet Commonly known as: Oxy IR/ROXICODONE Take 1 tablet (5 mg total) by mouth every 4 (four) hours as needed for moderate pain (pain score 4-6).   tamsulosin 0.4 MG Caps capsule Commonly known as: FLOMAX Take 0.4 mg by mouth daily.   Voltaren 1 % Gel Generic drug: diclofenac Sodium Apply 2 g topically daily as needed (knee pain).        Diagnostic Studies: No results found.  Disposition: Discharge disposition: 01-Home or Self Care       Discharge Instructions     Call MD / Call 911   Complete by: As directed    If you experience chest pain or shortness of breath, CALL 911 and be transported to the hospital emergency room.  If you develope a fever above 101 F, pus (white drainage) or increased drainage or redness at the wound, or calf pain, call your surgeon's office.   Constipation Prevention   Complete by: As directed    Drink plenty of fluids.  Prune juice may be helpful.  You may use a stool softener, such as Colace (over the counter) 100 mg twice a day.  Use MiraLax (over the counter) for constipation as needed.   Diet - low sodium heart healthy   Complete by: As directed    Discharge instructions   Complete by: As directed    You may shower, dressing is waterproof.  Do not remove the dressing, we will remove it at your first post-op appointment.  Do not take a bath or soak the knee in a tub or pool.  You may weightbear as you can tolerate on the operative leg with a walker.  Continue using the CPM machine 3 times per day for one hour each time, increasing the degrees of range of motion daily.  Use the blue cradle boot under  your heel to work on getting your leg straight.  Do NOT put a pillow under your knee.  You will follow-up with Dr. Marlou Mueller or Rica Mote in the clinic in 2 weeks at your given appointment date.    INSTRUCTIONS AFTER JOINT REPLACEMENT   Remove items at home which could result in a fall. This includes throw rugs or furniture in walking pathways ICE to the affected joint every three hours while awake for 30 minutes at a time, for at least the first 3-5 days, and then as needed for pain and swelling.  Continue to use ice for pain and swelling. You may notice swelling that will progress down to the foot and ankle.  This is normal after surgery.  Elevate your leg when you are not up walking on it.   Continue to use the breathing machine you got in the hospital (incentive spirometer) which will help keep your temperature down.  It is common for your temperature to cycle up and down following surgery, especially at night when you are not up moving around and exerting yourself.  The breathing machine keeps your lungs expanded and your temperature down.   DIET:  As you were doing prior to hospitalization, we recommend a well-balanced diet.  DRESSING / WOUND CARE / SHOWERING  Keep the surgical dressing until follow up.  The dressing is water proof,  so you can shower without any extra covering.  IF THE DRESSING FALLS OFF or the wound gets wet inside, change the dressing with sterile gauze.  Please use good hand washing techniques before changing the dressing.  Do not use any lotions or creams on the incision until instructed by your surgeon.    ACTIVITY  Increase activity slowly as tolerated, but follow the weight bearing instructions below.   No driving for 6 weeks or until further direction given by your physician.  You cannot drive while taking narcotics.  No lifting or carrying greater than 10 lbs. until further directed by your surgeon. Avoid periods of inactivity such as sitting longer than an hour when  not asleep. This helps prevent blood clots.  You may return to work once you are authorized by your doctor.     WEIGHT BEARING   Weight bearing as tolerated with assist device (walker, cane, etc) as directed, use it as long as suggested by your surgeon or therapist, typically at least 4-6 weeks.   EXERCISES  Results after joint replacement surgery are often greatly improved when you follow the exercise, range of motion and muscle strengthening exercises prescribed by your doctor. Safety measures are also important to protect the joint from further injury. Any time any of these exercises cause you to have increased pain or swelling, decrease what you are doing until you are comfortable again and then slowly increase them. If you have problems or questions, call your caregiver or physical therapist for advice.   Rehabilitation is important following a joint replacement. After just a few days of immobilization, the muscles of the leg can become weakened and shrink (atrophy).  These exercises are designed to build up the tone and strength of the thigh and leg muscles and to improve motion. Often times heat used for twenty to thirty minutes before working out will loosen up your tissues and help with improving the range of motion but do not use heat for the first two weeks following surgery (sometimes heat can increase post-operative swelling).   These exercises can be done on a training (exercise) mat, on the floor, on a table or on a bed. Use whatever works the best and is most comfortable for you.    Use music or television while you are exercising so that the exercises are a pleasant break in your day. This will make your life better with the exercises acting as a break in your routine that you can look forward to.   Perform all exercises about fifteen times, three times per day or as directed.  You should exercise both the operative leg and the other leg as well.  Exercises include:   Quad Sets -  Tighten up the muscle on the front of the thigh (Quad) and hold for 5-10 seconds.   Straight Leg Raises - With your knee straight (if you were given a brace, keep it on), lift the leg to 60 degrees, hold for 3 seconds, and slowly lower the leg.  Perform this exercise against resistance later as your leg gets stronger.  Leg Slides: Lying on your back, slowly slide your foot toward your buttocks, bending your knee up off the floor (only go as far as is comfortable). Then slowly slide your foot back down until your leg is flat on the floor again.  Angel Wings: Lying on your back spread your legs to the side as far apart as you can without causing discomfort.  Hamstring Strength:  Lying  on your back, push your heel against the floor with your leg straight by tightening up the muscles of your buttocks.  Repeat, but this time bend your knee to a comfortable angle, and push your heel against the floor.  You may put a pillow under the heel to make it more comfortable if necessary.   A rehabilitation program following joint replacement surgery can speed recovery and prevent re-injury in the future due to weakened muscles. Contact your doctor or a physical therapist for more information on knee rehabilitation.    CONSTIPATION  Constipation is defined medically as fewer than three stools per week and severe constipation as less than one stool per week.  Even if you have a regular bowel pattern at home, your normal regimen is likely to be disrupted due to multiple reasons following surgery.  Combination of anesthesia, postoperative narcotics, change in appetite and fluid intake all can affect your bowels.   YOU MUST use at least one of the following options; they are listed in order of increasing strength to get the job done.  They are all available over the counter, and you may need to use some, POSSIBLY even all of these options:    Drink plenty of fluids (prune juice may be helpful) and high fiber  foods Colace 100 mg by mouth twice a day  Senokot for constipation as directed and as needed Dulcolax (bisacodyl), take with full glass of water  Miralax (polyethylene glycol) once or twice a day as needed.  If you have tried all these things and are unable to have a bowel movement in the first 3-4 days after surgery call either your surgeon or your primary doctor.    If you experience loose stools or diarrhea, hold the medications until you stool forms back up.  If your symptoms do not get better within 1 week or if they get worse, check with your doctor.  If you experience "the worst abdominal pain ever" or develop nausea or vomiting, please contact the office immediately for further recommendations for treatment.   ITCHING:  If you experience itching with your medications, try taking only a single pain pill, or even half a pain pill at a time.  You can also use Benadryl over the counter for itching or also to help with sleep.   TED HOSE STOCKINGS:  Use stockings on both legs until for at least 2 weeks or as directed by physician office. They may be removed at night for sleeping.  MEDICATIONS:  See your medication summary on the "After Visit Summary" that nursing will review with you.  You may have some home medications which will be placed on hold until you complete the course of blood thinner medication.  It is important for you to complete the blood thinner medication as prescribed.  PRECAUTIONS:  If you experience chest pain or shortness of breath - call 911 immediately for transfer to the hospital emergency department.   If you develop a fever greater that 101 F, purulent drainage from wound, increased redness or drainage from wound, foul odor from the wound/dressing, or calf pain - CONTACT YOUR SURGEON.                                                   FOLLOW-UP APPOINTMENTS:  If you do not already have a post-op appointment, please  call the office for an appointment to be seen by your  surgeon.  Guidelines for how soon to be seen are listed in your "After Visit Summary", but are typically between 1-4 weeks after surgery.  OTHER INSTRUCTIONS:   Knee Replacement:  Do not place pillow under knee, focus on keeping the knee straight while resting. CPM instructions: 0-90 degrees, 2 hours in the morning, 2 hours in the afternoon, and 2 hours in the evening. Place foam block, curve side up under heel at all times except when in CPM or when walking.  DO NOT modify, tear, cut, or change the foam block in any way.  POST-OPERATIVE OPIOID TAPER INSTRUCTIONS: It is important to wean off of your opioid medication as soon as possible. If you do not need pain medication after your surgery it is ok to stop day one. Opioids include: Codeine, Hydrocodone(Norco, Vicodin), Oxycodone(Percocet, oxycontin) and hydromorphone amongst others.  Long term and even short term use of opiods can cause: Increased pain response Dependence Constipation Depression Respiratory depression And more.  Withdrawal symptoms can include Flu like symptoms Nausea, vomiting And more Techniques to manage these symptoms Hydrate well Eat regular healthy meals Stay active Use relaxation techniques(deep breathing, meditating, yoga) Do Not substitute Alcohol to help with tapering If you have been on opioids for less than two weeks and do not have pain than it is ok to stop all together.  Plan to wean off of opioids This plan should start within one week post op of your joint replacement. Maintain the same interval or time between taking each dose and first decrease the dose.  Cut the total daily intake of opioids by one tablet each day Next start to increase the time between doses. The last dose that should be eliminated is the evening dose.   MAKE SURE YOU:  Understand these instructions.  Get help right away if you are not doing well or get worse.    Thank you for letting us be a part of your medical care  team.  It is a privilege we respect greatly.  We hope these instructions will help you stay on track for a fast and full recovery!    Dental Antibiotics:  In most cases prophylactic antibiotics for Dental procdeures after total joint surgery are not necessary.  Exceptions are as follows:  1. History of prior total joint infection  2. Severely immunocompromised (Organ Transplant, cancer chemotherapy, Rheumatoid biologic meds such as Welby)  3. Poorly controlled diabetes (A1C &gt; 8.0, blood glucose over 200)  If you have one of these conditions, contact your surgeon for an antibiotic prescription, prior to your dental procedure.   Increase activity slowly as tolerated   Complete by: As directed    Post-operative opioid taper instructions:   Complete by: As directed    POST-OPERATIVE OPIOID TAPER INSTRUCTIONS: It is important to wean off of your opioid medication as soon as possible. If you do not need pain medication after your surgery it is ok to stop day one. Opioids include: Codeine, Hydrocodone(Norco, Vicodin), Oxycodone(Percocet, oxycontin) and hydromorphone amongst others.  Long term and even short term use of opiods can cause: Increased pain response Dependence Constipation Depression Respiratory depression And more.  Withdrawal symptoms can include Flu like symptoms Nausea, vomiting And more Techniques to manage these symptoms Hydrate well Eat regular healthy meals Stay active Use relaxation techniques(deep breathing, meditating, yoga) Do Not substitute Alcohol to help with tapering If you have been on opioids for less than two  weeks and do not have pain than it is ok to stop all together.  Plan to wean off of opioids This plan should start within one week post op of your joint replacement. Maintain the same interval or time between taking each dose and first decrease the dose.  Cut the total daily intake of opioids by one tablet each day Next start to increase  the time between doses. The last dose that should be eliminated is the evening dose.           Follow-up Information     Mosie Lukes, MD Follow up.   Specialty: Family Medicine Contact information: [place] [address] [place], [place] Follow up.   Specialty: [place] Why: home health PT services will be provided by [facility] information: [place] [address] [phone]                  Signed: Annie Main 07/31/2022, 8:35 AM

## 2022-08-04 DIAGNOSIS — M810 Age-related osteoporosis without current pathological fracture: Secondary | ICD-10-CM | POA: Diagnosis not present

## 2022-08-04 DIAGNOSIS — Z471 Aftercare following joint replacement surgery: Secondary | ICD-10-CM | POA: Diagnosis not present

## 2022-08-04 DIAGNOSIS — E785 Hyperlipidemia, unspecified: Secondary | ICD-10-CM | POA: Diagnosis not present

## 2022-08-04 DIAGNOSIS — D649 Anemia, unspecified: Secondary | ICD-10-CM | POA: Diagnosis not present

## 2022-08-04 DIAGNOSIS — I10 Essential (primary) hypertension: Secondary | ICD-10-CM | POA: Diagnosis not present

## 2022-08-04 DIAGNOSIS — F419 Anxiety disorder, unspecified: Secondary | ICD-10-CM | POA: Diagnosis not present

## 2022-08-05 ENCOUNTER — Telehealth: Payer: Self-pay | Admitting: *Deleted

## 2022-08-05 NOTE — Telephone Encounter (Signed)
Patient called requesting refill of pain medication, Gabapentin, and Muscle relaxer. Pharmacy on chart.

## 2022-08-06 ENCOUNTER — Other Ambulatory Visit: Payer: Self-pay | Admitting: Surgical

## 2022-08-06 DIAGNOSIS — Z471 Aftercare following joint replacement surgery: Secondary | ICD-10-CM | POA: Diagnosis not present

## 2022-08-06 DIAGNOSIS — I10 Essential (primary) hypertension: Secondary | ICD-10-CM | POA: Diagnosis not present

## 2022-08-06 DIAGNOSIS — E785 Hyperlipidemia, unspecified: Secondary | ICD-10-CM | POA: Diagnosis not present

## 2022-08-06 DIAGNOSIS — F419 Anxiety disorder, unspecified: Secondary | ICD-10-CM | POA: Diagnosis not present

## 2022-08-06 DIAGNOSIS — M810 Age-related osteoporosis without current pathological fracture: Secondary | ICD-10-CM | POA: Diagnosis not present

## 2022-08-06 DIAGNOSIS — D649 Anemia, unspecified: Secondary | ICD-10-CM | POA: Diagnosis not present

## 2022-08-06 MED ORDER — OXYCODONE HCL 5 MG PO TABS: 5.0000 mg | ORAL_TABLET | Freq: Four times a day (QID) | ORAL | 0 refills | Status: DC | PRN

## 2022-08-06 MED ORDER — GABAPENTIN 300 MG PO CAPS: 300.0000 mg | ORAL_CAPSULE | Freq: Three times a day (TID) | ORAL | 0 refills | Status: DC

## 2022-08-06 MED ORDER — METHOCARBAMOL 500 MG PO TABS: 500.0000 mg | ORAL_TABLET | Freq: Three times a day (TID) | ORAL | 0 refills | Status: DC | PRN

## 2022-08-06 NOTE — Telephone Encounter (Signed)
Sent to [facility] in [place] on [address], hope this is correct, there are two pharmacies

## 2022-08-08 ENCOUNTER — Telehealth: Payer: Self-pay | Admitting: Orthopedic Surgery

## 2022-08-08 ENCOUNTER — Ambulatory Visit (INDEPENDENT_AMBULATORY_CARE_PROVIDER_SITE_OTHER): Payer: Medicare Other | Admitting: Surgical

## 2022-08-08 ENCOUNTER — Ambulatory Visit (INDEPENDENT_AMBULATORY_CARE_PROVIDER_SITE_OTHER): Payer: Medicare Other

## 2022-08-08 DIAGNOSIS — Z471 Aftercare following joint replacement surgery: Secondary | ICD-10-CM | POA: Diagnosis not present

## 2022-08-08 DIAGNOSIS — D649 Anemia, unspecified: Secondary | ICD-10-CM | POA: Diagnosis not present

## 2022-08-08 DIAGNOSIS — M810 Age-related osteoporosis without current pathological fracture: Secondary | ICD-10-CM | POA: Diagnosis not present

## 2022-08-08 DIAGNOSIS — Z96651 Presence of right artificial knee joint: Secondary | ICD-10-CM

## 2022-08-08 DIAGNOSIS — E785 Hyperlipidemia, unspecified: Secondary | ICD-10-CM | POA: Diagnosis not present

## 2022-08-08 DIAGNOSIS — I10 Essential (primary) hypertension: Secondary | ICD-10-CM | POA: Diagnosis not present

## 2022-08-08 DIAGNOSIS — F419 Anxiety disorder, unspecified: Secondary | ICD-10-CM | POA: Diagnosis not present

## 2022-08-08 NOTE — Telephone Encounter (Signed)
[facility] advise discharging patient from home P/t, will be released to continue P/T in [facility]

## 2022-08-09 ENCOUNTER — Encounter: Payer: Self-pay | Admitting: Surgical

## 2022-08-09 NOTE — Progress Notes (Signed)
Post-Op Visit Note   Patient: [name]           Date of Birth: 03/09/1946           MRN: [id number] Visit Date: 08/08/2022 PCP: Mosie Lukes, MD   Assessment & Plan:  Chief Complaint:  Chief Complaint  Patient presents with   Right Knee - Pain   Visit Diagnoses:  1. S/P total knee arthroplasty, right     Plan: [name] is a 77 y.o. male who presents s/p right total knee arthroplasty on 07/24/2022.  Doing well overall.  Did well with home health physical therapy and they were measuring him at around 90 degrees.  They deny any calf pain, shortness of breath, chest pain, abdominal pain.  Pain is overall controlled and taking oxycodone only at night for pain control.  Taking aspirin for DVT prophylaxis.  Ambulating with a walker.   On exam, patient has range of motion 8 degrees of extension to 90 degrees of knee flexion.  No effusion..  Incision is healing well without evidence of infection or dehiscence.  2+ DP pulse of the operative extremity.  No calf tenderness, negative Homans' sign.  Able to perform straight leg raise.  Intact ankle dorsiflexion.  Plan is start outpatient physical therapy at [facility].  He is already scheduled to start this next week.  He is doing a great job of achieving knee flexion but needs to work a little bit on knee extension which I demonstrated for him in the office today.  He understands that is important to get the knee straight in order to avoid walking with a limp.  He does walk well today with watching him walk with a walker.  Advised him that the nighttime pain is usually one of the last symptoms to go away after knee replacement and he understands.  Plan to follow-up in 4 weeks for clinical recheck with Dr. Marlou Sa..    Follow-Up Instructions: No follow-ups on file.   Orders:  Orders Placed This Encounter  Procedures   XR Knee 1-2 Views Right   No orders of the defined types were placed in this encounter.   Imaging: No  results found.  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   Skin tags, multiple acquired 07/07/2019   LUQ pain 07/07/2019   OA (osteoarthritis) of knee 03/16/2019   Ventral hernia s/p lap repair with mesh 06/03/2018 06/03/2018   Hemorrhoids 08/22/2016   Anemia 03/16/2016   Right shoulder pain 02/26/2016   Abnormal CT scan, gastrointestinal tract suspect angioedema    Abdominal pain    Hyperglycemia 08/21/2013   Low back pain 10/10/2012   Hyperlipidemia    Hypertension, essential     Obesity (BMI 30.0-34.9)    Past Medical History:  Diagnosis Date   Abnormal CT scan, gastrointestinal tract suspect angioedema    Anemia 03/16/2016   IRON   Anxiety    Arthritis    Back pain 10/10/2012   Fatty liver 2005   seen on 2005 ultrasound,05/2015 CT.    Hemorrhoids 08/22/2016   Hemorrhoids, internal, with bleeding 2005   internal and external. 2005 band ligation (dr Ferdinand Lango in [facility])   Hyperglycemia 08/21/2013   Hyperlipidemia    Hypertension    Nocturia 08/21/2013   Obesity (BMI 30.0-34.9)  Osteoporosis    Pneumonia    as a child   Rectal bleeding 07/30/2014   Renal cyst    SBO (small bowel obstruction) (HCC)    Tubular adenoma of colon before 2005, 2012, 08/2014   Unspecified constipation 02/06/2014   Vitamin D deficiency     Family History  Problem Relation Age of Onset   Stroke Mother    Aneurysm Mother        brain   Arthritis Father    Liver cancer Father        liver, atomic testing in [place]   Gallbladder disease Father    Hyperlipidemia Sister    Hypertension Sister    Obesity Sister    Diabetes Sister    Diabetes Brother    Hyperlipidemia Sister    Hypertension Sister    Obesity Sister    Diabetes Sister    Aneurysm Brother        brain   COPD Brother    Gallbladder disease  Sister        x4   Colon cancer Neg Hx    Colon polyps Neg Hx    Esophageal cancer Neg Hx    Rectal cancer Neg Hx    Stomach cancer Neg Hx    Prostate cancer Neg Hx    CAD Neg Hx     Past Surgical History:  Procedure Laterality Date   COLONOSCOPY  before 2005, 2012, 2013, 08/2014.    ENTEROSCOPY N/A 10/05/2017   Procedure: ENTEROSCOPY;  Surgeon: Gatha Mayer, MD;  Location: [facility];  Service: Endoscopy;  Laterality: N/A;   FRACTURE SURGERY Left 1989   leg   HEMORRHOID BANDING  2005   IR RADIOLOGIST EVAL & MGMT  11/11/2021   KNEE SURGERY Left 2010   arthroscopy, torn carilage   KNEE SURGERY Right 2012   arthroscopy   TONSILLECTOMY  1957   TONSILLECTOMY     TOTAL KNEE ARTHROPLASTY Right 07/24/2022   Procedure: RIGHT TOTAL KNEE ARTHROPLASTY;  Surgeon: Meredith Pel, MD;  Location: [place];  Service: Orthopedics;  Laterality: Right;   TOTAL SHOULDER ARTHROPLASTY Left 11/01/2020   Procedure: LEFT ANATOMIC SHOULDER REPLACEMENT;  Surgeon: Meredith Pel, MD;  Location: [place];  Service: Orthopedics;  Laterality: Left;   VENTRAL HERNIA REPAIR N/A 06/03/2018   Procedure: LAPAROSCOPIC VENTRAL WALL  HERNIA  REPAIR WITH MESH  ERAS PATHWAY;  Surgeon: Michael Boston, MD;  Location: [facility];  Service: General;  Laterality: N/A;   Social History   Occupational History   Occupation: Retired    Fish farm manager: [employer]  Tobacco Use   Smoking status: Never   Smokeless tobacco: Never  Vaping Use   Vaping Use: Never used  Substance and Sexual Activity   Alcohol use: Not Currently    Comment: Occassionally   Drug use: No   Sexual activity: Yes

## 2022-08-12 ENCOUNTER — Ambulatory Visit: Payer: Medicare Other | Attending: Orthopedic Surgery | Admitting: Physical Therapy

## 2022-08-12 ENCOUNTER — Encounter: Payer: Self-pay | Admitting: Physical Therapy

## 2022-08-12 DIAGNOSIS — R600 Localized edema: Secondary | ICD-10-CM | POA: Insufficient documentation

## 2022-08-12 DIAGNOSIS — M25661 Stiffness of right knee, not elsewhere classified: Secondary | ICD-10-CM | POA: Diagnosis not present

## 2022-08-12 DIAGNOSIS — Z96651 Presence of right artificial knee joint: Secondary | ICD-10-CM | POA: Insufficient documentation

## 2022-08-12 DIAGNOSIS — M25561 Pain in right knee: Secondary | ICD-10-CM | POA: Diagnosis not present

## 2022-08-12 DIAGNOSIS — R262 Difficulty in walking, not elsewhere classified: Secondary | ICD-10-CM | POA: Diagnosis not present

## 2022-08-12 DIAGNOSIS — M6281 Muscle weakness (generalized): Secondary | ICD-10-CM | POA: Diagnosis not present

## 2022-08-12 DIAGNOSIS — M25612 Stiffness of left shoulder, not elsewhere classified: Secondary | ICD-10-CM | POA: Insufficient documentation

## 2022-08-12 DIAGNOSIS — M62838 Other muscle spasm: Secondary | ICD-10-CM | POA: Insufficient documentation

## 2022-08-12 DIAGNOSIS — M25512 Pain in left shoulder: Secondary | ICD-10-CM | POA: Insufficient documentation

## 2022-08-12 NOTE — Therapy (Signed)
OUTPATIENT PHYSICAL THERAPY LOWER EXTREMITY EVALUATION   Patient Name: [name] MRN: [id number] DOB:[birth year], 77 y.o., male Today's Date: 08/12/2022  END OF SESSION:  PT End of Session - 08/12/22 1541     Visit Number 1    Number of Visits 12    Date for PT Re-Evaluation 09/23/22    Authorization Type Medicare + T[employer] for Life    PT Start Time 1535    PT Stop Time 1625    PT Time Calculation (min) 50 min    Activity Tolerance Patient tolerated treatment well    Behavior During Therapy [facility] for tasks assessed/performed             Past Medical History:  Diagnosis Date   Abnormal CT scan, gastrointestinal tract suspect angioedema    Anemia 03/16/2016   IRON   Anxiety    Arthritis    Back pain 10/10/2012   Fatty liver 2005   seen on 2005 ultrasound,05/2015 CT.    Hemorrhoids 08/22/2016   Hemorrhoids, internal, with bleeding 2005   internal and external. 2005 band ligation (dr Ferdinand Lango in [facility])   Hyperglycemia 08/21/2013   Hyperlipidemia    Hypertension    Nocturia 08/21/2013   Obesity (BMI 30.0-34.9)    Osteoporosis    Pneumonia    as a child   Rectal bleeding 07/30/2014   Renal cyst    SBO (small bowel obstruction) ([place])    Tubular adenoma of colon before 2005, 2012, 08/2014   Unspecified constipation 02/06/2014   Vitamin D deficiency    Past Surgical History:  Procedure Laterality Date   COLONOSCOPY  before 2005, 2012, 2013, 08/2014.    ENTEROSCOPY N/A 10/05/2017   Procedure: ENTEROSCOPY;  Surgeon: Gatha Mayer, MD;  Location: [facility];  Service: Endoscopy;  Laterality: N/A;   FRACTURE SURGERY Left 1989   leg   HEMORRHOID BANDING  2005   IR RADIOLOGIST EVAL & MGMT  11/11/2021   KNEE SURGERY Left 2010   arthroscopy, torn carilage   KNEE SURGERY Right 2012   arthroscopy   TONSILLECTOMY  1957   TONSILLECTOMY     TOTAL KNEE ARTHROPLASTY Right 07/24/2022   Procedure: RIGHT TOTAL KNEE ARTHROPLASTY;  Surgeon: Meredith Pel, MD;  Location: [place];  Service: Orthopedics;  Laterality: Right;   TOTAL SHOULDER ARTHROPLASTY Left 11/01/2020   Procedure: LEFT ANATOMIC SHOULDER REPLACEMENT;  Surgeon: Meredith Pel, MD;  Location: [place];  Service: Orthopedics;  Laterality: Left;   VENTRAL HERNIA REPAIR N/A 06/03/2018   Procedure: LAPAROSCOPIC VENTRAL WALL  HERNIA  REPAIR WITH MESH  ERAS PATHWAY;  Surgeon: Michael Boston, MD;  Location: [facility];  Service: General;  Laterality: N/A;   Patient Active Problem List   Diagnosis Date Noted   Arthritis of right knee 07/27/2022   S/P total knee arthroplasty, right 07/24/2022   Shoulder arthritis    S/P shoulder replacement, 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   Skin tags, multiple acquired 07/07/2019   LUQ pain 07/07/2019   OA (osteoarthritis) of knee 03/16/2019   Ventral hernia s/p lap repair with mesh 06/03/2018 06/03/2018   Hemorrhoids 08/22/2016   Anemia 03/16/2016   Right shoulder pain 02/26/2016   Abnormal CT scan, gastrointestinal tract suspect angioedema    Abdominal pain    Hyperglycemia 08/21/2013   Low back pain 10/10/2012   Hyperlipidemia    Hypertension, essential     Obesity (BMI 30.0-34.9)     PCP: Mosie Lukes, MD   REFERRING PROVIDER: Meredith Pel,  MD   REFERRING DIAG: s/p R TKA   THERAPY DIAG:  Acute pain of right knee  Stiffness of right knee, not elsewhere classified  Localized edema  Difficulty in walking, not elsewhere classified  Rationale for Evaluation and Treatment: Rehabilitation  ONSET DATE: 07/24/2022  SUBJECTIVE:   SUBJECTIVE STATEMENT: [name] had surgery on 07/24/2022 for R TKA.  He reports it is healing well, has iceman machine at home which uses frequently.  Still having a lot of pain and swelling.  Performing HEP 2x/week.  Discharged from [place] on Friday.    NEXT MD VISIT: 09/05/2022  PERTINENT HISTORY: History back pain, L TSA, Ventral hernia repair PAIN:  Are you having pain? Yes: NPRS scale: 7-8/10 Pain location: R knee Pain  description: throbbing  Aggravating factors: laying in bed Relieving factors: cold  PRECAUTIONS: None  WEIGHT BEARING RESTRICTIONS: No  FALLS:  Has patient fallen in last 6 months? No  LIVING ENVIRONMENT: Lives with: lives with their spouse Lives in: House/apartment Stairs: Yes: Internal: 12 steps; on right going up and External: 2 steps; none Has following equipment at home: Walker - 2 wheeled  OCCUPATION: retired   PLOF: Independent and Leisure: golf, yard work, gym- bike  PATIENT GOALS: get back to normal life- walking without pain, playing golf, get back to gym, be active, sit down and not hurt.    OBJECTIVE:   DIAGNOSTIC FINDINGS: 08/08/22 AP, lateral views of right knee reviewed. Right total knee prosthesis in excellent position and alignment without any complicating features. No fracture. No dislocation.   PATIENT SURVEYS:  LEFS 28.75%  COGNITION: Overall cognitive status: Within functional limits for tasks assessed     SENSATION: Occasional numbness/tingling to R foot, but intact to light touch  EDEMA:  Circumferential: R 44.5 cm, L 41.5 cm  MUSCLE LENGTH: Hamstrings: tightness R hamstring, lacking full extension.   POSTURE: weight shift left  PALPATION: Warmth in R knee but no signs of infection, no tenderness in calf, good patellar mobility, good scar mobility.   LOWER EXTREMITY ROM:  Passive ROM Right eval Left eval  Knee flexion 100 120  Knee extension 10 0   (Blank rows = not tested)  LOWER EXTREMITY MMT:  MMT Right eval Left eval  Hip flexion 5 5  Hip extension    Hip abduction 5 5  Hip adduction 5 5  Knee flexion    Knee extension 2 (30 deg quad lag) 5  Ankle dorsiflexion    Ankle plantarflexion     (Blank rows = not tested)  LOWER EXTREMITY SPECIAL TESTS:  NT  FUNCTIONAL TESTS:  5 times sit to stand: 13 seconds  GAIT: Distance walked: 50' Assistive device utilized: Environmental consultant - 2 wheeled Level of assistance: Modified  independence Comments: decreased weight shift to RLE, lacking R knee extension in stance.     TODAY'S TREATMENT:  DATE:   08/12/2022 Therapeutic Exercise: to improve strength and mobility.  Demo, verbal and tactile cues throughout for technique. Review of current HEP (supine heel slides, Quad sets, glut sets, hip abduction, seated LAQ, quad sets), progression HEP to including standing quad sets (5 x 5 sec hold today) and church pews x 15.  Vasopneumatic: Gameready at end of session for post session soreness/edema.  10 min, low compression, 34 deg (coldest).    PATIENT EDUCATION:  Education details: HEP update Person educated: Patient Education method: Explanation, Demonstration, Verbal cues, and Handouts Education comprehension: verbalized understanding and returned demonstration  HOME EXERCISE PROGRAM: Access Code: [email or web address] URL: [email or web address] Date: 08/12/2022 Prepared by: Glenetta Hew  Exercises - Seated Quad Set  - 2 x daily - 7 x weekly - 1 sets - 10 reps - 5 sec  hold - Standing Quad Set  - 2 x daily - 7 x weekly - 1 sets - 10 reps - 5 sec  hold - Church Pew  - 2 x daily - 7 x weekly - 2 sets - 10 reps  ASSESSMENT:  CLINICAL IMPRESSION: Patient is a 77 y.o. male who was seen today for physical therapy evaluation and treatment for s/p R TKA on 07/24/2022.  He has had 2 weeks HHPT and was discharged last Friday.  Reports good pain control and good compliance with HEP. Marland Kitchen   OBJECTIVE IMPAIRMENTS: Abnormal gait, decreased activity tolerance, decreased endurance, decreased mobility, difficulty walking, decreased ROM, decreased strength, increased edema, increased muscle spasms, impaired flexibility, impaired sensation, and pain.   ACTIVITY LIMITATIONS: lifting, bending, sitting, standing, squatting, stairs, transfers, and locomotion  level  PARTICIPATION LIMITATIONS: meal prep, cleaning, laundry, driving, shopping, community activity, and yard work  PERSONAL FACTORS: 1-2 comorbidities: osteoporosis, history back pain  are also affecting patient's functional outcome.   REHAB POTENTIAL: Excellent  CLINICAL DECISION MAKING: Stable/uncomplicated  EVALUATION COMPLEXITY: Low   GOALS: Goals reviewed with patient? Yes   SHORT TERM GOALS: Target date: 08/26/2022    Independent with initial HEP. Baseline: given Goal status: INITIAL   LONG TERM GOALS: Target date: 09/23/2022   Independent with advanced/ongoing HEP to improve outcomes and carryover.  Baseline:  Goal status: INITIAL  2.  [name] will demonstrate R knee flexion to 120 deg to ascend/descend stairs. Baseline: 100 Goal status: INITIAL  3.  [name] will demonstrate full R knee extension for safety with gait. Baseline: lacking 10 Goal status: INITIAL  4.  [name] will be able to ambulate 600' safely with LRAD and normal gait pattern to access community.  Baseline: 2WRW needed Goal status: INITIAL  5.  [name] will be able to ascend/descend stairs with 1 HR and reciprocal step pattern safely to access home and community.  Baseline: HR or walker needed Goal status: INITIAL  6.  [name] will demonstrate > 19/24 on DGI to demonstrate decreased risk of falls.   Baseline: NT Goal status: INITIAL  7.  [name] will demonstrate improved functional LE strength by demonstrating 5/5 R quad strength Baseline: 2/5 with quad lag Goal status: INITIAL  8.  [name] will demonstrate >40/80 on LEFS to demonstrate improved mobility.  Baseline: 23/80 Goal status: INITIAL  PLAN:  PT FREQUENCY: 2x/week  PT DURATION: 6 weeks  PLANNED INTERVENTIONS: Therapeutic exercises, Therapeutic activity, Neuromuscular re-education, Balance training, Gait training, Patient/Family education, Self Care, Joint mobilization, Stair  training, Dry Needling, Electrical stimulation, Cryotherapy, Moist heat, Taping, Vasopneumatic device, Ultrasound, Manual therapy, and Re-evaluation  PLAN FOR  NEXT SESSION: progress strengthening and ROM as tolerated.     [name], PT, DPT  08/12/2022, 6:26 PM

## 2022-08-13 ENCOUNTER — Ambulatory Visit: Payer: Medicare Other

## 2022-08-13 DIAGNOSIS — M25661 Stiffness of right knee, not elsewhere classified: Secondary | ICD-10-CM | POA: Diagnosis not present

## 2022-08-13 DIAGNOSIS — R262 Difficulty in walking, not elsewhere classified: Secondary | ICD-10-CM | POA: Diagnosis not present

## 2022-08-13 DIAGNOSIS — R600 Localized edema: Secondary | ICD-10-CM | POA: Diagnosis not present

## 2022-08-13 DIAGNOSIS — M25512 Pain in left shoulder: Secondary | ICD-10-CM | POA: Diagnosis not present

## 2022-08-13 DIAGNOSIS — M25561 Pain in right knee: Secondary | ICD-10-CM | POA: Diagnosis not present

## 2022-08-13 DIAGNOSIS — M25612 Stiffness of left shoulder, not elsewhere classified: Secondary | ICD-10-CM | POA: Diagnosis not present

## 2022-08-13 NOTE — Therapy (Signed)
OUTPATIENT PHYSICAL THERAPY TREATMENT   Patient Name: [name] MRN: [id number] DOB:1945/10/06, 77 y.o., male Today's Date: 08/13/2022  END OF SESSION:  PT End of Session - 08/13/22 1445     Visit Number 2    Number of Visits 12    Date for PT Re-Evaluation 09/23/22    Authorization Type Medicare + Tric[employer] for Life    PT Start Time 1402    PT Stop Time 1455    PT Time Calculation (min) 53 min    Activity Tolerance Patient tolerated treatment well    Behavior During Therapy [facility] for tasks assessed/performed              Past Medical History:  Diagnosis Date   Abnormal CT scan, gastrointestinal tract suspect angioedema    Anemia 03/16/2016   IRON   Anxiety    Arthritis    Back pain 10/10/2012   Fatty liver 2005   seen on 2005 ultrasound,05/2015 CT.    Hemorrhoids 08/22/2016   Hemorrhoids, internal, with bleeding 2005   internal and external. 2005 band ligation (dr Ferdinand Lango in [facility])   Hyperglycemia 08/21/2013   Hyperlipidemia    Hypertension    Nocturia 08/21/2013   Obesity (BMI 30.0-34.9)    Osteoporosis    Pneumonia    as a child   Rectal bleeding 07/30/2014   Renal cyst    SBO (small bowel obstruction) ([place])    Tubular adenoma of colon before 2005, 2012, 08/2014   Unspecified constipation 02/06/2014   Vitamin D deficiency    Past Surgical History:  Procedure Laterality Date   COLONOSCOPY  before 2005, 2012, 2013, 08/2014.    ENTEROSCOPY N/A 10/05/2017   Procedure: ENTEROSCOPY;  Surgeon: Gatha Mayer, MD;  Location: [facility];  Service: Endoscopy;  Laterality: N/A;   FRACTURE SURGERY Left 1989   leg   HEMORRHOID BANDING  2005   IR RADIOLOGIST EVAL & MGMT  11/11/2021   KNEE SURGERY Left 2010   arthroscopy, torn carilage   KNEE SURGERY Right 2012   arthroscopy   TONSILLECTOMY  1957   TONSILLECTOMY     TOTAL KNEE ARTHROPLASTY Right 07/24/2022   Procedure: RIGHT TOTAL KNEE ARTHROPLASTY;  Surgeon: Meredith Pel, MD;  Location: [place];  Service:  Orthopedics;  Laterality: Right;   TOTAL SHOULDER ARTHROPLASTY Left 11/01/2020   Procedure: LEFT ANATOMIC SHOULDER REPLACEMENT;  Surgeon: Meredith Pel, MD;  Location: [place];  Service: Orthopedics;  Laterality: Left;   VENTRAL HERNIA REPAIR N/A 06/03/2018   Procedure: LAPAROSCOPIC VENTRAL WALL  HERNIA  REPAIR WITH MESH  ERAS PATHWAY;  Surgeon: Michael Boston, MD;  Location: [facility];  Service: General;  Laterality: N/A;   Patient Active Problem List   Diagnosis Date Noted   Arthritis of right knee 07/27/2022   S/P total knee arthroplasty, right 07/24/2022   Shoulder arthritis    S/P shoulder replacement, 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   Skin tags, multiple acquired 07/07/2019   LUQ pain 07/07/2019   OA (osteoarthritis) of knee 03/16/2019   Ventral hernia s/p lap repair with mesh 06/03/2018 06/03/2018   Hemorrhoids 08/22/2016   Anemia 03/16/2016   Right shoulder pain 02/26/2016   Abnormal CT scan, gastrointestinal tract suspect angioedema    Abdominal pain    Hyperglycemia 08/21/2013   Low back pain 10/10/2012   Hyperlipidemia    Hypertension, essential     Obesity (BMI 30.0-34.9)     PCP: Mosie Lukes, MD   REFERRING PROVIDER: Meredith Pel, MD  REFERRING DIAG: s/p R TKA   THERAPY DIAG:  Acute pain of right knee  Stiffness of right knee, not elsewhere classified  Localized edema  Difficulty in walking, not elsewhere classified  Rationale for Evaluation and Treatment: Rehabilitation  ONSET DATE: 07/24/2022  SUBJECTIVE:   SUBJECTIVE STATEMENT: Pt reports pain today, walking at home with walker mostly and doing HEP.  NEXT MD VISIT: 09/05/2022  PERTINENT HISTORY: History back pain, L TSA, Ventral hernia repair PAIN:  Are you having pain? Yes: NPRS scale: 6/10 Pain location: R knee Pain description: ache Aggravating factors: laying in bed Relieving factors: cold  PRECAUTIONS: None  WEIGHT BEARING RESTRICTIONS: No  FALLS:  Has patient fallen in last 6 months?  No  LIVING ENVIRONMENT: Lives with: lives with their spouse Lives in: House/apartment Stairs: Yes: Internal: 12 steps; on right going up and External: 2 steps; none Has following equipment at home: Walker - 2 wheeled  OCCUPATION: retired   PLOF: Independent and Leisure: golf, yard work, gym- bike  PATIENT GOALS: get back to normal life- walking without pain, playing golf, get back to gym, be active, sit down and not hurt.    OBJECTIVE:   DIAGNOSTIC FINDINGS: 08/08/22 AP, lateral views of right knee reviewed. Right total knee prosthesis in excellent position and alignment without any complicating features. No fracture. No dislocation.   PATIENT SURVEYS:  LEFS 28.75%  COGNITION: Overall cognitive status: Within functional limits for tasks assessed     SENSATION: Occasional numbness/tingling to R foot, but intact to light touch  EDEMA:  Circumferential: R 44.5 cm, L 41.5 cm  MUSCLE LENGTH: Hamstrings: tightness R hamstring, lacking full extension.   POSTURE: weight shift left  PALPATION: Warmth in R knee but no signs of infection, no tenderness in calf, good patellar mobility, good scar mobility.   LOWER EXTREMITY ROM:  Passive ROM Right eval Left eval  Knee flexion 100 120  Knee extension 10 0   (Blank rows = not tested)  LOWER EXTREMITY MMT:  MMT Right eval Left eval  Hip flexion 5 5  Hip extension    Hip abduction 5 5  Hip adduction 5 5  Knee flexion    Knee extension 2 (30 deg quad lag) 5  Ankle dorsiflexion    Ankle plantarflexion     (Blank rows = not tested)  LOWER EXTREMITY SPECIAL TESTS:  NT  FUNCTIONAL TESTS:  5 times sit to stand: 13 seconds  GAIT: Distance walked: 50' Assistive device utilized: Environmental consultant - 2 wheeled Level of assistance: Modified independence Comments: decreased weight shift to RLE, lacking R knee extension in stance.     TODAY'S TREATMENT:                                                                                                                               DATE:  08/13/22 Therapeutic Exercise: to improve strength and mobility.  Demo, verbal and tactile cues throughout for technique. Rec Bike  partial rev at first going to full rev fwd and back Gait: RW 2x90 ft; SPC x 90 ft Seated heel slide with strap x 20 Seated LAQ 2# x 10 Seated march 2# x 10  Standing heel raise x 10 with RW Mini squat x 10  Standing hip abduction x 10 bil with RW  Vasopneumatic: Gameready at end of session for post session soreness/edema.  10 min, low compression, 34 deg (coldest).   08/12/2022 Therapeutic Exercise: to improve strength and mobility.  Demo, verbal and tactile cues throughout for technique. Review of current HEP (supine heel slides, Quad sets, glut sets, hip abduction, seated LAQ, quad sets), progression HEP to including standing quad sets (5 x 5 sec hold today) and church pews x 15.  Vasopneumatic: Gameready at end of session for post session soreness/edema.  10 min, low compression, 34 deg (coldest).    PATIENT EDUCATION:  Education details: HEP update Person educated: Patient Education method: Explanation, Demonstration, Verbal cues, and Handouts Education comprehension: verbalized understanding and returned demonstration  HOME EXERCISE PROGRAM: Access Code: [email or web address] URL: [email or web address] Date: 08/12/2022 Prepared by: Glenetta Hew  Exercises - Seated Quad Set  - 2 x daily - 7 x weekly - 1 sets - 10 reps - 5 sec  hold - Standing Quad Set  - 2 x daily - 7 x weekly - 1 sets - 10 reps - 5 sec  hold - Church Pew  - 2 x daily - 7 x weekly - 2 sets - 10 reps  ASSESSMENT:  CLINICAL IMPRESSION: Pt able to tolerate progression of exercises well. Able to do full rev on bike today with encouragement. Added some standing exercises to increase WB tolerance and strength in standing position. He did well but definitely more difficult putting all weight on RLE,shown with leaning to R side.  Adjusted height of walker slightly lower, as well as instructing him to stand upright with step through pattern. Concluded session with GR to address swelling and soreness post exercise.  OBJECTIVE IMPAIRMENTS: Abnormal gait, decreased activity tolerance, decreased endurance, decreased mobility, difficulty walking, decreased ROM, decreased strength, increased edema, increased muscle spasms, impaired flexibility, impaired sensation, and pain.   ACTIVITY LIMITATIONS: lifting, bending, sitting, standing, squatting, stairs, transfers, and locomotion level  PARTICIPATION LIMITATIONS: meal prep, cleaning, laundry, driving, shopping, community activity, and yard work  PERSONAL FACTORS: 1-2 comorbidities: osteoporosis, history back pain  are also affecting patient's functional outcome.   REHAB POTENTIAL: Excellent  CLINICAL DECISION MAKING: Stable/uncomplicated  EVALUATION COMPLEXITY: Low   GOALS: Goals reviewed with patient? Yes   SHORT TERM GOALS: Target date: 08/26/2022    Independent with initial HEP. Baseline: given Goal status: IN PROGRESS   LONG TERM GOALS: Target date: 09/23/2022   Independent with advanced/ongoing HEP to improve outcomes and carryover.  Baseline:  Goal status: IN PROGRESS  2.  [name] will demonstrate R knee flexion to 120 deg to ascend/descend stairs. Baseline: 100 Goal status: IN PROGRESS  3.  [name] will demonstrate full R knee extension for safety with gait. Baseline: lacking 10 Goal status: IN PROGRESS  4.  [name] will be able to ambulate 600' safely with LRAD and normal gait pattern to access community.  Baseline: 2WRW needed Goal status: IN PROGRESS  5.  [name] will be able to ascend/descend stairs with 1 HR and reciprocal step pattern safely to access home and community.  Baseline: HR or walker needed Goal status: INITIAL  6.  [name] will demonstrate > 19/24  on DGI to demonstrate decreased risk of falls.    Baseline: NT Goal status: IN PROGRESS  7.  [name] will demonstrate improved functional LE strength by demonstrating 5/5 R quad strength Baseline: 2/5 with quad lag Goal status: IN PROGRESS  8.  [name] will demonstrate >40/80 on LEFS to demonstrate improved mobility.  Baseline: 23/80 Goal status: IN PROGRESS  PLAN:  PT FREQUENCY: 2x/week  PT DURATION: 6 weeks  PLANNED INTERVENTIONS: Therapeutic exercises, Therapeutic activity, Neuromuscular re-education, Balance training, Gait training, Patient/Family education, Self Care, Joint mobilization, Stair training, Dry Needling, Electrical stimulation, Cryotherapy, Moist heat, Taping, Vasopneumatic device, Ultrasound, Manual therapy, and Re-evaluation  PLAN FOR NEXT SESSION: progress strengthening and ROM as tolerated. Add more standing ex to session; practice weaning from RW to cane    [employer], PTA 08/13/2022, 3:36 PM

## 2022-08-18 ENCOUNTER — Telehealth: Payer: Self-pay | Admitting: *Deleted

## 2022-08-18 ENCOUNTER — Encounter: Payer: Self-pay | Admitting: Family Medicine

## 2022-08-18 ENCOUNTER — Encounter: Payer: Self-pay | Admitting: Physical Therapy

## 2022-08-18 ENCOUNTER — Ambulatory Visit: Payer: Medicare Other | Admitting: Physical Therapy

## 2022-08-18 ENCOUNTER — Other Ambulatory Visit: Payer: Self-pay | Admitting: Orthopedic Surgery

## 2022-08-18 DIAGNOSIS — R262 Difficulty in walking, not elsewhere classified: Secondary | ICD-10-CM

## 2022-08-18 DIAGNOSIS — M25612 Stiffness of left shoulder, not elsewhere classified: Secondary | ICD-10-CM | POA: Diagnosis not present

## 2022-08-18 DIAGNOSIS — M25512 Pain in left shoulder: Secondary | ICD-10-CM | POA: Diagnosis not present

## 2022-08-18 DIAGNOSIS — M25661 Stiffness of right knee, not elsewhere classified: Secondary | ICD-10-CM

## 2022-08-18 DIAGNOSIS — R600 Localized edema: Secondary | ICD-10-CM | POA: Diagnosis not present

## 2022-08-18 DIAGNOSIS — M25561 Pain in right knee: Secondary | ICD-10-CM

## 2022-08-18 MED ORDER — HYDROCORTISONE ACETATE 25 MG RE SUPP: 25.0000 mg | Freq: Two times a day (BID) | RECTAL | 1 refills | Status: DC | PRN

## 2022-08-18 MED ORDER — GABAPENTIN 300 MG PO CAPS: 300.0000 mg | ORAL_CAPSULE | Freq: Three times a day (TID) | ORAL | 0 refills | Status: DC

## 2022-08-18 MED ORDER — OXYCODONE HCL 5 MG PO TABS: 5.0000 mg | ORAL_TABLET | Freq: Four times a day (QID) | ORAL | 0 refills | Status: DC | PRN

## 2022-08-18 NOTE — Telephone Encounter (Signed)
Meds sent to [facility]

## 2022-08-18 NOTE — Therapy (Signed)
OUTPATIENT PHYSICAL THERAPY TREATMENT   Patient Name: [name] MRN: [id number] DOB:1945-11-04, 77 y.o., male Today's Date: 08/18/2022  END OF SESSION:  PT End of Session - 08/18/22 1404     Visit Number 3    Number of Visits 12    Date for PT Re-Evaluation 09/23/22    Authorization Type Medicare + Tricare for Life    PT Start Time 1402    PT Stop Time 1453    PT Time Calculation (min) 51 min    Activity Tolerance Patient tolerated treatment well    Behavior During Therapy [facility] for tasks assessed/performed              Past Medical History:  Diagnosis Date   Abnormal CT scan, gastrointestinal tract suspect angioedema    Anemia 03/16/2016   IRON   Anxiety    Arthritis    Back pain 10/10/2012   Fatty liver 2005   seen on 2005 ultrasound,05/2015 CT.    Hemorrhoids 08/22/2016   Hemorrhoids, internal, with bleeding 2005   internal and external. 2005 band ligation (dr Ferdinand Lango in [facility])   Hyperglycemia 08/21/2013   Hyperlipidemia    Hypertension    Nocturia 08/21/2013   Obesity (BMI 30.0-34.9)    Osteoporosis    Pneumonia    as a child   Rectal bleeding 07/30/2014   Renal cyst    SBO (small bowel obstruction) ([place])    Tubular adenoma of colon before 2005, 2012, 08/2014   Unspecified constipation 02/06/2014   Vitamin D deficiency    Past Surgical History:  Procedure Laterality Date   COLONOSCOPY  before 2005, 2012, 2013, 08/2014.    ENTEROSCOPY N/A 10/05/2017   Procedure: ENTEROSCOPY;  Surgeon: Gatha Mayer, MD;  [facility]: [facility];  Service: Endoscopy;  Laterality: N/A;   FRACTURE SURGERY Left 1989   leg   HEMORRHOID BANDING  2005   IR RADIOLOGIST EVAL & MGMT  11/11/2021   KNEE SURGERY Left 2010   arthroscopy, torn carilage   KNEE SURGERY Right 2012   arthroscopy   TONSILLECTOMY  1957   TONSILLECTOMY     TOTAL KNEE ARTHROPLASTY Right 07/24/2022   Procedure: RIGHT TOTAL KNEE ARTHROPLASTY;  Surgeon: Meredith Pel, MD;  Location: [place];  Service:  Orthopedics;  Laterality: Right;   TOTAL SHOULDER ARTHROPLASTY Left 11/01/2020   Procedure: LEFT ANATOMIC SHOULDER REPLACEMENT;  Surgeon: Meredith Pel, MD;  Location: [place];  Service: Orthopedics;  Laterality: Left;   VENTRAL HERNIA REPAIR N/A 06/03/2018   Procedure: LAPAROSCOPIC VENTRAL WALL  HERNIA  REPAIR WITH MESH  ERAS PATHWAY;  Surgeon: Michael Boston, MD;  Location: [facility];  Service: General;  Laterality: N/A;   Patient Active Problem List   Diagnosis Date Noted   Arthritis of right knee 07/27/2022   S/P total knee arthroplasty, right 07/24/2022   Shoulder arthritis    S/P shoulder replacement, 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   Skin tags, multiple acquired 07/07/2019   LUQ pain 07/07/2019   OA (osteoarthritis) of knee 03/16/2019   Ventral hernia s/p lap repair with mesh 06/03/2018 06/03/2018   Hemorrhoids 08/22/2016   Anemia 03/16/2016   Right shoulder pain 02/26/2016   Abnormal CT scan, gastrointestinal tract suspect angioedema    Abdominal pain    Hyperglycemia 08/21/2013   Low back pain 10/10/2012   Hyperlipidemia    Hypertension, essential     Obesity (BMI 30.0-34.9)     PCP: Mosie Lukes, MD   REFERRING PROVIDER: Meredith Pel, MD  REFERRING DIAG: s/p R TKA   THERAPY DIAG:  Acute pain of right knee  Stiffness of right knee, not elsewhere classified  Localized edema  Difficulty in walking, not elsewhere classified  Rationale for Evaluation and Treatment: Rehabilitation  ONSET DATE: 07/24/2022  SUBJECTIVE:   SUBJECTIVE STATEMENT: Pt reports he tried to wean off pain medication but couldn't sleep so is back on schedule.   NEXT MD VISIT: 09/05/2022  PERTINENT HISTORY: History back pain, L TSA, Ventral hernia repair PAIN:  Are you having pain? Yes: NPRS scale: 4/10 Pain location: R knee Pain description: ache Aggravating factors: laying in bed Relieving factors: cold  PRECAUTIONS: None  WEIGHT BEARING RESTRICTIONS: No  FALLS:  Has patient fallen  in last 6 months? No  LIVING ENVIRONMENT: Lives with: lives with their spouse Lives in: House/apartment Stairs: Yes: Internal: 12 steps; on right going up and External: 2 steps; none Has following equipment at home: Walker - 2 wheeled  OCCUPATION: retired   PLOF: Independent and Leisure: golf, yard work, gym- bike  PATIENT GOALS: get back to normal life- walking without pain, playing golf, get back to gym, be active, sit down and not hurt.    OBJECTIVE:   DIAGNOSTIC FINDINGS: 08/08/22 AP, lateral views of right knee reviewed. Right total knee prosthesis in excellent position and alignment without any complicating features. No fracture. No dislocation.   PATIENT SURVEYS:  LEFS 28.75%  COGNITION: Overall cognitive status: Within functional limits for tasks assessed     SENSATION: Occasional numbness/tingling to R foot, but intact to light touch  EDEMA:  Circumferential: R 44.5 cm, L 41.5 cm  MUSCLE LENGTH: Hamstrings: tightness R hamstring, lacking full extension.   POSTURE: weight shift left  PALPATION: Warmth in R knee but no signs of infection, no tenderness in calf, good patellar mobility, good scar mobility.   LOWER EXTREMITY ROM:  Passive ROM Right eval Left eval Right   Knee flexion 100 135 104  Knee extension 10 0 6   (Blank rows = not tested)  LOWER EXTREMITY MMT:  MMT Right eval Left eval  Hip flexion 5 5  Hip extension    Hip abduction 5 5  Hip adduction 5 5  Knee flexion    Knee extension 2 (30 deg quad lag) 5  Ankle dorsiflexion    Ankle plantarflexion     (Blank rows = not tested)  LOWER EXTREMITY SPECIAL TESTS:  NT  FUNCTIONAL TESTS:  5 times sit to stand: 13 seconds  GAIT: Distance walked: 50' Assistive device utilized: Environmental consultant - 2 wheeled Level of assistance: Modified independence Comments: decreased weight shift to RLE, lacking R knee extension in stance.     TODAY'S TREATMENT:                                                                                                                               DATE:   08/18/22 Therapeutic Exercise: to improve strength and mobility.  Demo, verbal and tactile cues throughout for technique. Nustep L5 x 6 min  At counter with bil UE support: Heel raises x 15  Hip extension 2  x 10 bil  Hip abduction 2 x 10 bil  Hamstring curls 2 x 10 bil  Mini-squats 2 x 10  Sit to stands 2 x 10 - with feet even, no UE assist.  Gait without AD x 90' with SBA for safety Knee flexion stretch in supine 2 x 30 sec hold Manual Therapy: to decrease muscle spasm and pain and improve mobility Scar tissue mobilization, patellar mobs, STM to lateral knee Vasopneumatic: Gameready at end of session for post session soreness/edema.  10 min, low compression, 34 deg (coldest).    08/13/22 Therapeutic Exercise: to improve strength and mobility.  Demo, verbal and tactile cues throughout for technique. Rec Bike partial rev at first going to full rev fwd and back Gait: RW 2x90 ft; SPC x 90 ft Seated heel slide with strap x 20 Seated LAQ 2# x 10 Seated march 2# x 10  Standing heel raise x 10 with RW Mini squat x 10  Standing hip abduction x 10 bil with RW   Vasopneumatic: Gameready at end of session for post session soreness/edema.  10 min, low compression, 34 deg (coldest).   08/12/2022 Therapeutic Exercise: to improve strength and mobility.  Demo, verbal and tactile cues throughout for technique. Review of current HEP (supine heel slides, Quad sets, glut sets, hip abduction, seated LAQ, quad sets), progression HEP to including standing quad sets (5 x 5 sec hold today) and church pews x 15.  Vasopneumatic: Gameready at end of session for post session soreness/edema.  10 min, low compression, 34 deg (coldest).    PATIENT EDUCATION:  Education details: HEP update Person educated: Patient Education method: Explanation, Demonstration, Verbal cues, and Handouts Education comprehension: verbalized  understanding and returned demonstration  HOME EXERCISE PROGRAM: Access Code: [email or web address] URL: [email or web address] Date: 08/18/2022 Prepared by: Glenetta Hew  Exercises - Seated Quad Set  - 2 x daily - 7 x weekly - 1 sets - 10 reps - 5 sec  hold - Standing Quad Set  - 2 x daily - 7 x weekly - 1 sets - 10 reps - 5 sec  hold - Church Pew  - 2 x daily - 7 x weekly - 2 sets - 10 reps - Heel Raises with Counter Support  - 1 x daily - 7 x weekly - 2 sets - 10 reps - Standing Hip Abduction with Counter Support  - 1 x daily - 7 x weekly - 2 sets - 10 reps - Standing Hip Extension with Counter Support  - 1 x daily - 7 x weekly - 2 sets - 10 reps - Mini Squat with Counter Support  - 1 x daily - 7 x weekly - 2 sets - 10 reps - Standing Knee Flexion  - 1 x daily - 7 x weekly - 2 sets - 10 reps  ASSESSMENT:  CLINICAL IMPRESSION: Patient is making good progress, tolerated progression of exercises to include more WB/standing exercises with good tolerance.  Compliant with current exercises meeting STG #1.  Reporting some pain in lateral knee that improved with STM, reviewed scar tissue mobilization and patellar mobs today.  His ROM was 6-104.  Concluded session with GR to address swelling and soreness post exercise.  [name] continues to demonstrate potential for improvement and would benefit from continued skilled therapy to address impairments.  OBJECTIVE IMPAIRMENTS: Abnormal gait, decreased activity tolerance, decreased endurance, decreased mobility, difficulty walking, decreased ROM, decreased strength, increased edema, increased muscle spasms, impaired flexibility, impaired sensation, and pain.   ACTIVITY LIMITATIONS: lifting, bending, sitting, standing, squatting, stairs, transfers, and locomotion level  PARTICIPATION LIMITATIONS: meal prep, cleaning, laundry, driving, shopping, community activity, and yard work  PERSONAL FACTORS: 1-2 comorbidities: osteoporosis,  history back pain  are also affecting patient's functional outcome.   REHAB POTENTIAL: Excellent  CLINICAL DECISION MAKING: Stable/uncomplicated  EVALUATION COMPLEXITY: Low   GOALS: Goals reviewed with patient? Yes   SHORT TERM GOALS: Target date: 08/26/2022    Independent with initial HEP. Baseline: given Goal status: MET 08/18/22- good compliance.    LONG TERM GOALS: Target date: 09/23/2022   Independent with advanced/ongoing HEP to improve outcomes and carryover.  Baseline:  Goal status: IN PROGRESS  2.  [name] will demonstrate R knee flexion to 120 deg to ascend/descend stairs. Baseline: 100 Goal status: IN PROGRESS 08/18/22- 104  3.  [name] will demonstrate full R knee extension for safety with gait. Baseline: lacking 10 Goal status: IN PROGRESS 08/18/22- lacking 6  4.  [name] will be able to ambulate 600' safely with LRAD and normal gait pattern to access community.  Baseline: 2WRW needed Goal status: IN PROGRESS  08/18/22- 90' without AD  5.  [name] will be able to ascend/descend stairs with 1 HR and reciprocal step pattern safely to access home and community.  Baseline: HR or walker needed Goal status: IN PROGRESS  6.  [name] will demonstrate > 19/24 on DGI to demonstrate decreased risk of falls.   Baseline: NT Goal status: IN PROGRESS  7.  [name] will demonstrate improved functional LE strength by demonstrating 5/5 R quad strength Baseline: 2/5 with quad lag Goal status: IN PROGRESS  8.  [name] will demonstrate >40/80 on LEFS to demonstrate improved mobility.  Baseline: 23/80 Goal status: IN PROGRESS  PLAN:  PT FREQUENCY: 2x/week  PT DURATION: 6 weeks  PLANNED INTERVENTIONS: Therapeutic exercises, Therapeutic activity, Neuromuscular re-education, Balance training, Gait training, Patient/Family education, Self Care, Joint mobilization, Stair training, Dry Needling, Electrical stimulation, Cryotherapy, Moist  heat, Taping, Vasopneumatic device, Ultrasound, Manual therapy, and Re-evaluation  PLAN FOR NEXT SESSION: progress strengthening and ROM as tolerated. Add more standing ex to session; practice weaning from RW to cane    Rennie Natter, PT, DPT  08/18/2022, 3:40 PM

## 2022-08-18 NOTE — Telephone Encounter (Signed)
Call from patient requesting refill of pain medication and Gabapentin. OPPT going very well. Pharmacy is correct on chart-[facility].

## 2022-08-20 NOTE — Therapy (Signed)
OUTPATIENT PHYSICAL THERAPY TREATMENT   Patient Name: [name] MRN: [id number] DOB:1945-12-28, 77 y.o., male Today's Date: 08/21/2022  END OF SESSION:  PT End of Session - 08/21/22 1105     Visit Number 4    Number of Visits 12    Date for PT Re-Evaluation 09/23/22    Authorization Type Medicare + [employer]    PT Start Time 1103    PT Stop Time 1151    PT Time Calculation (min) 48 min    Activity Tolerance Patient tolerated treatment well    Behavior During Therapy [facility] for tasks assessed/performed               Past Medical History:  Diagnosis Date   Abnormal CT scan, gastrointestinal tract suspect angioedema    Anemia 03/16/2016   IRON   Anxiety    Arthritis    Back pain 10/10/2012   Fatty liver 2005   seen on 2005 ultrasound,05/2015 CT.    Hemorrhoids 08/22/2016   Hemorrhoids, internal, with bleeding 2005   internal and external. 2005 band ligation (dr Ferdinand Lango in [facility])   Hyperglycemia 08/21/2013   Hyperlipidemia    Hypertension    Nocturia 08/21/2013   Obesity (BMI 30.0-34.9)    Osteoporosis    Pneumonia    as a child   Rectal bleeding 07/30/2014   Renal cyst    SBO (small bowel obstruction) ([place])    Tubular adenoma of colon before 2005, 2012, 08/2014   Unspecified constipation 02/06/2014   Vitamin D deficiency    Past Surgical History:  Procedure Laterality Date   COLONOSCOPY  before 2005, 2012, 2013, 08/2014.    ENTEROSCOPY N/A 10/05/2017   Procedure: ENTEROSCOPY;  Surgeon: Gatha Mayer, MD;  Location: [facility];  Service: Endoscopy;  Laterality: N/A;   FRACTURE SURGERY Left 1989   leg   HEMORRHOID BANDING  2005   IR RADIOLOGIST EVAL & MGMT  11/11/2021   KNEE SURGERY Left 2010   arthroscopy, torn carilage   KNEE SURGERY Right 2012   arthroscopy   TONSILLECTOMY  1957   TONSILLECTOMY     TOTAL KNEE ARTHROPLASTY Right 07/24/2022   Procedure: RIGHT TOTAL KNEE ARTHROPLASTY;  Surgeon: Meredith Pel, MD;  Location: [place];  Service:  Orthopedics;  Laterality: Right;   TOTAL SHOULDER ARTHROPLASTY Left 11/01/2020   Procedure: LEFT ANATOMIC SHOULDER REPLACEMENT;  Surgeon: Meredith Pel, MD;  Location: [place];  Service: Orthopedics;  Laterality: Left;   VENTRAL HERNIA REPAIR N/A 06/03/2018   Procedure: LAPAROSCOPIC VENTRAL WALL  HERNIA  REPAIR WITH MESH  ERAS PATHWAY;  Surgeon: Michael Boston, MD;  Location: [facility];  Service: General;  Laterality: N/A;   Patient Active Problem List   Diagnosis Date Noted   Arthritis of right knee 07/27/2022   S/P total knee arthroplasty, right 07/24/2022   Shoulder arthritis    S/P shoulder replacement, 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   Skin tags, multiple acquired 07/07/2019   LUQ pain 07/07/2019   OA (osteoarthritis) of knee 03/16/2019   Ventral hernia s/p lap repair with mesh 06/03/2018 06/03/2018   Hemorrhoids 08/22/2016   Anemia 03/16/2016   Right shoulder pain 02/26/2016   Abnormal CT scan, gastrointestinal tract suspect angioedema    Abdominal pain    Hyperglycemia 08/21/2013   Low back pain 10/10/2012   Hyperlipidemia    Hypertension, essential     Obesity (BMI 30.0-34.9)     PCP: Mosie Lukes, MD   REFERRING PROVIDER: Meredith Pel,  MD   REFERRING DIAG: s/p R TKA   THERAPY DIAG:  Acute pain of right knee  Stiffness of right knee, not elsewhere classified  Localized edema  Difficulty in walking, not elsewhere classified  Acute pain of left shoulder  Stiffness of left shoulder, not elsewhere classified  Muscle weakness (generalized)  Other muscle spasm  Rationale for Evaluation and Treatment: Rehabilitation  ONSET DATE: 07/24/2022  SUBJECTIVE:   SUBJECTIVE STATEMENT: I use the walker some at home  NEXT MD VISIT: 09/05/2022  PERTINENT HISTORY: History back pain, L TSA, Ventral hernia repair PAIN:  Are you having pain? Yes: NPRS scale: 3.5-5/10 Pain location: R knee Pain description: ache Aggravating factors: laying in bed Relieving factors:  cold  PRECAUTIONS: None  WEIGHT BEARING RESTRICTIONS: No  FALLS:  Has patient fallen in last 6 months? No  LIVING ENVIRONMENT: Lives with: lives with their spouse Lives in: House/apartment Stairs: Yes: Internal: 12 steps; on right going up and External: 2 steps; none Has following equipment at home: Walker - 2 wheeled  OCCUPATION: retired   PLOF: Independent and Leisure: golf, yard work, gym- bike  PATIENT GOALS: get back to normal life- walking without pain, playing golf, get back to gym, be active, sit down and not hurt.    OBJECTIVE:   DIAGNOSTIC FINDINGS: 08/08/22 AP, lateral views of right knee reviewed. Right total knee prosthesis in excellent position and alignment without any complicating features. No fracture. No dislocation.   PATIENT SURVEYS:  LEFS 28.75%  COGNITION: Overall cognitive status: Within functional limits for tasks assessed     SENSATION: Occasional numbness/tingling to R foot, but intact to light touch  EDEMA:  Circumferential: R 44.5 cm, L 41.5 cm  MUSCLE LENGTH: Hamstrings: tightness R hamstring, lacking full extension.   POSTURE: weight shift left  PALPATION: Warmth in R knee but no signs of infection, no tenderness in calf, good patellar mobility, good scar mobility.   LOWER EXTREMITY ROM:  Passive ROM Right eval Left eval Right  Right 08/21/22  Knee flexion 100 135 104 110  Knee extension 10 0 6 -4   (Blank rows = not tested)  LOWER EXTREMITY MMT:  MMT Right eval Left eval  Hip flexion 5 5  Hip extension    Hip abduction 5 5  Hip adduction 5 5  Knee flexion    Knee extension 2 (30 deg quad lag) 5  Ankle dorsiflexion    Ankle plantarflexion     (Blank rows = not tested)  LOWER EXTREMITY SPECIAL TESTS:  NT  FUNCTIONAL TESTS:  5 times sit to stand: 13 seconds  GAIT: Distance walked: 50' Assistive device utilized: Environmental consultant - 2 wheeled Level of assistance: Modified independence Comments: decreased weight shift to  RLE, lacking R knee extension in stance.     TODAY'S TREATMENT:  DATE:   08/21/22 Therapeutic Exercise: to improve strength and mobility.  Demo, verbal and tactile cues throughout for technique. Bike seat 9 up to seat 8 L1  x 7 min  Prone quad set 5 sec hold x 15 Prone knee flex x 10 Knee flexion stretch in supine 2 x 30 sec hold Sit to stands 2 x 10 - with feet even, no UE assist. 8 inch step ups fwd and lat 2x 10 SLS bilaterally x 30 sec ea   Gait in hallway no working on equal step length -   Manual Therapy: to decrease muscle spasm and pain and improve mobility patellar mobs  Vasopneumatic: Gameready at end of session for post session soreness/edema.  10 min, med compression, 34 deg (coldest).   08/18/22 Therapeutic Exercise: to improve strength and mobility.  Demo, verbal and tactile cues throughout for technique. Nustep L5 x 6 min  At counter with bil UE support: Heel raises x 15  Hip extension 2  x 10 bil  Hip abduction 2 x 10 bil  Hamstring curls 2 x 10 bil  Mini-squats 2 x 10  Sit to stands 2 x 10 - with feet even, no UE assist.  Gait without AD x 90' with SBA for safety Knee flexion stretch in supine 2 x 30 sec hold Manual Therapy: to decrease muscle spasm and pain and improve mobility Scar tissue mobilization, patellar mobs, STM to lateral knee Vasopneumatic: Gameready at end of session for post session soreness/edema.  10 min, low compression, 34 deg (coldest).     PATIENT EDUCATION:  Education details: HEP update Person educated: Patient Education method: Explanation, Demonstration, Verbal cues, and Handouts Education comprehension: verbalized understanding and returned demonstration  HOME EXERCISE PROGRAM: Access Code: [email or web address] URL: [email or web address] Date: 08/18/2022 Prepared by: Glenetta Hew  Exercises -  Seated Quad Set  - 2 x daily - 7 x weekly - 1 sets - 10 reps - 5 sec  hold - Standing Quad Set  - 2 x daily - 7 x weekly - 1 sets - 10 reps - 5 sec  hold - [place]  - 2 x daily - 7 x weekly - 2 sets - 10 reps - Heel Raises with Counter Support  - 1 x daily - 7 x weekly - 2 sets - 10 reps - Standing Hip Abduction with Counter Support  - 1 x daily - 7 x weekly - 2 sets - 10 reps - Standing Hip Extension with Counter Support  - 1 x daily - 7 x weekly - 2 sets - 10 reps - Mini Squat with Counter Support  - 1 x daily - 7 x weekly - 2 sets - 10 reps - Standing Knee Flexion  - 1 x daily - 7 x weekly - 2 sets - 10 reps  ASSESSMENT:  CLINICAL IMPRESSION: [name] continues to improve ROM measuring 4-110 deg today. He was able to improve step length with cueing. No LOB with gait. Good demonstration of SLS bil. He reported some increased knee pain with lateral step ups during second set.   OBJECTIVE IMPAIRMENTS: Abnormal gait, decreased activity tolerance, decreased endurance, decreased mobility, difficulty walking, decreased ROM, decreased strength, increased edema, increased muscle spasms, impaired flexibility, impaired sensation, and pain.   ACTIVITY LIMITATIONS: lifting, bending, sitting, standing, squatting, stairs, transfers, and locomotion level  PARTICIPATION LIMITATIONS: meal prep, cleaning, laundry, driving, shopping, community activity, and yard work  PERSONAL FACTORS: 1-2 comorbidities: osteoporosis, history back pain  are also affecting patient's functional  outcome.   REHAB POTENTIAL: Excellent  CLINICAL DECISION MAKING: Stable/uncomplicated  EVALUATION COMPLEXITY: Low   GOALS: Goals reviewed with patient? Yes   SHORT TERM GOALS: Target date: 08/26/2022    Independent with initial HEP. Baseline: given Goal status: MET 08/18/22- good compliance.    LONG TERM GOALS: Target date: 09/23/2022   Independent with advanced/ongoing HEP to improve outcomes and carryover.  Baseline:   Goal status: IN PROGRESS  2.  [name] will demonstrate R knee flexion to 120 deg to ascend/descend stairs. Baseline: 100 Goal status: IN PROGRESS 08/18/22- 104  3.  [name] will demonstrate full R knee extension for safety with gait. Baseline: lacking 10 Goal status: IN PROGRESS 08/18/22- lacking 6  4.  [name] will be able to ambulate 600' safely with LRAD and normal gait pattern to access community.  Baseline: 2WRW needed Goal status: IN PROGRESS  08/18/22- 90' without AD  5.  [name] will be able to ascend/descend stairs with 1 HR and reciprocal step pattern safely to access home and community.  Baseline: HR or walker needed Goal status: IN PROGRESS  6.  [name] will demonstrate > 19/24 on DGI to demonstrate decreased risk of falls.   Baseline: NT Goal status: IN PROGRESS  7.  [name] will demonstrate improved functional LE strength by demonstrating 5/5 R quad strength Baseline: 2/5 with quad lag Goal status: IN PROGRESS  8.  [name] will demonstrate >40/80 on LEFS to demonstrate improved mobility.  Baseline: 23/80 Goal status: IN PROGRESS  PLAN:  PT FREQUENCY: 2x/week  PT DURATION: 6 weeks  PLANNED INTERVENTIONS: Therapeutic exercises, Therapeutic activity, Neuromuscular re-education, Balance training, Gait training, Patient/Family education, Self Care, Joint mobilization, Stair training, Dry Needling, Electrical stimulation, Cryotherapy, Moist heat, Taping, Vasopneumatic device, Ultrasound, Manual therapy, and Re-evaluation  PLAN FOR NEXT SESSION: progress strengthening and ROM as tolerated. Add more standing ex to session; practice weaning from RW to cane    Williemae Muriel, PT 08/21/2022, 11:52 AM

## 2022-08-21 ENCOUNTER — Encounter: Payer: Self-pay | Admitting: Physical Therapy

## 2022-08-21 ENCOUNTER — Ambulatory Visit: Payer: Medicare Other | Admitting: Physical Therapy

## 2022-08-21 DIAGNOSIS — M25612 Stiffness of left shoulder, not elsewhere classified: Secondary | ICD-10-CM | POA: Diagnosis not present

## 2022-08-21 DIAGNOSIS — M62838 Other muscle spasm: Secondary | ICD-10-CM

## 2022-08-21 DIAGNOSIS — M25661 Stiffness of right knee, not elsewhere classified: Secondary | ICD-10-CM | POA: Diagnosis not present

## 2022-08-21 DIAGNOSIS — M25512 Pain in left shoulder: Secondary | ICD-10-CM | POA: Diagnosis not present

## 2022-08-21 DIAGNOSIS — R600 Localized edema: Secondary | ICD-10-CM

## 2022-08-21 DIAGNOSIS — R262 Difficulty in walking, not elsewhere classified: Secondary | ICD-10-CM

## 2022-08-21 DIAGNOSIS — M25561 Pain in right knee: Secondary | ICD-10-CM

## 2022-08-21 DIAGNOSIS — M6281 Muscle weakness (generalized): Secondary | ICD-10-CM

## 2022-08-24 NOTE — Therapy (Incomplete)
OUTPATIENT PHYSICAL THERAPY TREATMENT   Patient Name: [name] MRN: [id number] DOB:[birth year], 77 y.o., male Today's Date: 08/24/2022  END OF SESSION:      Past Medical History:  Diagnosis Date   Abnormal CT scan, gastrointestinal tract suspect angioedema    Anemia 03/16/2016   IRON   Anxiety    Arthritis    Back pain 10/10/2012   Fatty liver 2005   seen on 2005 ultrasound,05/2015 CT.    Hemorrhoids 08/22/2016   Hemorrhoids, internal, with bleeding 2005   internal and external. 2005 band ligation (dr Ferdinand Lango in [facility])   Hyperglycemia 08/21/2013   Hyperlipidemia    Hypertension    Nocturia 08/21/2013   Obesity (BMI 30.0-34.9)    Osteoporosis    Pneumonia    as a child   Rectal bleeding 07/30/2014   Renal cyst    SBO (small bowel obstruction) ([place])    Tubular adenoma of colon before 2005, 2012, 08/2014   Unspecified constipation 02/06/2014   Vitamin D deficiency    Past Surgical History:  Procedure Laterality Date   COLONOSCOPY  before 2005, 2012, 2013, 08/2014.    ENTEROSCOPY N/A 10/05/2017   Procedure: ENTEROSCOPY;  Surgeon: Gatha Mayer, MD;  [facility];  Service: Endoscopy;  Laterality: N/A;   FRACTURE SURGERY Left 1989   leg   HEMORRHOID BANDING  2005   IR RADIOLOGIST EVAL & MGMT  11/11/2021   KNEE SURGERY Left 2010   arthroscopy, torn carilage   KNEE SURGERY Right 2012   arthroscopy   TONSILLECTOMY  1957   TONSILLECTOMY     TOTAL KNEE ARTHROPLASTY Right 07/24/2022   Procedure: RIGHT TOTAL KNEE ARTHROPLASTY;  Surgeon: Meredith Pel, MD;  Location: [place];  Service: Orthopedics;  Laterality: Right;   TOTAL SHOULDER ARTHROPLASTY Left 11/01/2020   Procedure: LEFT ANATOMIC SHOULDER REPLACEMENT;  Surgeon: Meredith Pel, MD;  Location: [place];  Service: Orthopedics;  Laterality: Left;   VENTRAL HERNIA REPAIR N/A 06/03/2018   Procedure: LAPAROSCOPIC VENTRAL WALL  HERNIA  REPAIR WITH MESH  ERAS PATHWAY;  Surgeon: Michael Boston, MD;  Location:  [facility];  Service: General;  Laterality: N/A;   Patient Active Problem List   Diagnosis Date Noted   Arthritis of right knee 07/27/2022   S/P total knee arthroplasty, right 07/24/2022   Shoulder arthritis    S/P shoulder replacement, 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  Rationale for Evaluation and Treatment: Rehabilitation  ONSET DATE: 07/24/2022  SUBJECTIVE:   SUBJECTIVE STATEMENT: ***  NEXT MD VISIT: 09/05/2022  PERTINENT HISTORY: History back pain, L TSA, Ventral hernia repair PAIN:  Are you having pain? Yes: NPRS scale: 3.5-5/10 Pain location: R knee Pain description: ache Aggravating factors: laying in bed Relieving factors: cold  PRECAUTIONS: None  WEIGHT BEARING RESTRICTIONS: No  FALLS:  Has patient fallen in last  6 months? No  LIVING ENVIRONMENT: Lives with: lives with their spouse Lives in: House/apartment Stairs: Yes: Internal: 12 steps; on right going up and External: 2 steps; none Has following equipment at home: Walker - 2 wheeled  OCCUPATION: retired   PLOF: Independent and Leisure: golf, yard work, gym- bike  PATIENT GOALS: get back to normal life- walking without pain, playing golf, get back to gym, be active, sit down and not hurt.    OBJECTIVE:   DIAGNOSTIC FINDINGS: 08/08/22 AP, lateral views of right knee reviewed. Right total knee prosthesis in excellent position  and alignment without any complicating features. No fracture. No dislocation.   PATIENT SURVEYS:  LEFS 28.75%  COGNITION: Overall cognitive status: Within functional limits for tasks assessed     SENSATION: Occasional numbness/tingling to R foot, but intact to light touch  EDEMA:  Circumferential: R 44.5 cm, L 41.5 cm  MUSCLE LENGTH: Hamstrings: tightness R hamstring, lacking full extension.   POSTURE: weight shift left  PALPATION: Warmth in R knee but no signs of infection, no tenderness in calf, good patellar mobility, good scar mobility.   LOWER EXTREMITY ROM:  Passive ROM Right eval Left eval Right  Right 08/21/22  Knee flexion 100 135 104 110  Knee extension 10 0 6 -4   (Blank rows = not tested)  LOWER EXTREMITY MMT:  MMT Right eval Left eval  Hip flexion 5 5  Hip extension    Hip abduction 5 5  Hip adduction 5 5  Knee flexion    Knee extension 2 (30 deg quad lag) 5  Ankle dorsiflexion    Ankle plantarflexion     (Blank rows = not tested)  LOWER EXTREMITY SPECIAL TESTS:  NT  FUNCTIONAL TESTS:  5 times sit to stand: 13 seconds  GAIT: Distance walked: 50' Assistive device utilized: Environmental consultant - 2 wheeled Level of assistance: Modified independence Comments: decreased weight shift to RLE, lacking R knee extension in stance.     TODAY'S TREATMENT:                                                                                                                              DATE:   08/25/22 Therapeutic Exercise: to improve strength and mobility.  Demo, verbal and tactile cues throughout for technique. Bike seat 9 up to seat 8 L1  x 7 min  Prone quad set 5 sec hold x 15 Prone knee flex x 10 Knee flexion stretch in supine 2 x 30 sec hold Sit to stands 2 x 10 - with feet even, no UE assist. 8 inch step ups fwd and lat 2x 10 SLS bilaterally x 30 sec ea   Gait in hallway no working on equal step length -   Manual Therapy: to decrease muscle spasm and pain  and improve mobility patellar mobs  Vasopneumatic: Gameready at end of session for post session soreness/edema.  10 min, med compression, 34 deg (coldest).  08/21/22 Therapeutic Exercise: to improve strength and mobility.  Demo, verbal and tactile cues throughout for technique. Bike seat 9 up to seat 8 L1  x 7 min  Prone quad set 5 sec hold x 15 Prone knee flex x 10 Knee flexion stretch in supine 2 x 30 sec hold Sit to stands 2 x 10 - with feet even, no UE assist. 8 inch step ups fwd and lat 2x 10 SLS bilaterally x 30 sec ea   Gait in hallway no working on equal step length -   Manual Therapy: to decrease muscle spasm and pain and improve mobility patellar mobs  Vasopneumatic: Gameready at end of session for post session soreness/edema.  10 min, med compression, 34 deg (coldest).   08/18/22 Therapeutic Exercise: to improve strength and mobility.  Demo, verbal and tactile cues throughout for technique. Nustep L5 x 6 min  At counter with bil UE support: Heel raises x 15  Hip extension 2  x 10 bil  Hip abduction 2 x 10 bil  Hamstring curls 2 x 10 bil  Mini-squats 2 x 10  Sit to stands 2 x 10 - with feet even, no UE assist.  Gait without AD x 90' with SBA for safety Knee flexion stretch in supine 2 x 30 sec hold Manual Therapy: to decrease muscle spasm and pain and improve mobility Scar tissue mobilization, patellar mobs, STM to lateral knee Vasopneumatic: Gameready at end of session for post session soreness/edema.  10 min, low compression, 34 deg (coldest).     PATIENT EDUCATION:  Education details: HEP update Person educated: Patient Education method: Explanation, Demonstration, Verbal cues, and Handouts Education comprehension: verbalized understanding and returned demonstration  HOME EXERCISE PROGRAM: Access Code: [email or web address] URL: [email or web address] Date: 08/18/2022 Prepared by: Glenetta Hew  Exercises - Seated Quad Set  - 2 x daily - 7 x weekly  - 1 sets - 10 reps - 5 sec  hold - Standing Quad Set  - 2 x daily - 7 x weekly - 1 sets - 10 reps - 5 sec  hold - [place]  - 2 x daily - 7 x weekly - 2 sets - 10 reps - Heel Raises with Counter Support  - 1 x daily - 7 x weekly - 2 sets - 10 reps - Standing Hip Abduction with Counter Support  - 1 x daily - 7 x weekly - 2 sets - 10 reps - Standing Hip Extension with Counter Support  - 1 x daily - 7 x weekly - 2 sets - 10 reps - Mini Squat with Counter Support  - 1 x daily - 7 x weekly - 2 sets - 10 reps - Standing Knee Flexion  - 1 x daily - 7 x weekly - 2 sets - 10 reps  ASSESSMENT:  CLINICAL IMPRESSION: ***  OBJECTIVE IMPAIRMENTS: Abnormal gait, decreased activity tolerance, decreased endurance, decreased mobility, difficulty walking, decreased ROM, decreased strength, increased edema, increased muscle spasms, impaired flexibility, impaired sensation, and pain.   ACTIVITY LIMITATIONS: lifting, bending, sitting, standing, squatting, stairs, transfers, and locomotion level  PARTICIPATION LIMITATIONS: meal prep, cleaning, laundry, driving, shopping, community activity, and yard work  PERSONAL FACTORS: 1-2 comorbidities: osteoporosis, history back pain  are also affecting patient's functional outcome.   REHAB POTENTIAL: Excellent  CLINICAL DECISION MAKING: Stable/uncomplicated  EVALUATION COMPLEXITY: Low   GOALS: Goals reviewed with patient? Yes   SHORT TERM GOALS: Target date: 08/26/2022    Independent with initial HEP. Baseline: given Goal status: MET  08/18/22- good compliance.    LONG TERM GOALS: Target date: 09/23/2022   Independent with advanced/ongoing HEP to improve outcomes and carryover.  Baseline:  Goal status: IN PROGRESS  2.  Ihor Dow will demonstrate R knee flexion to 120 deg to ascend/descend stairs. Baseline: 100 Goal status: IN PROGRESS 08/18/22- 104  3.  Ihor Dow will demonstrate full R knee extension for safety with gait. Baseline: lacking  10 Goal status: IN PROGRESS 08/18/22- lacking 6  4.  Ceth Mullady will be able to ambulate 600' safely with LRAD and normal gait pattern to access community.  Baseline: 2WRW needed Goal status: IN PROGRESS  08/18/22- 90' without AD  5.  Ihor Dow will be able to ascend/descend stairs with 1 HR and reciprocal step pattern safely to access home and community.  Baseline: HR or walker needed Goal status: IN PROGRESS  6.  Yidel Garbo will demonstrate > 19/24 on DGI to demonstrate decreased risk of falls.   Baseline: NT Goal status: IN PROGRESS  7.  Ihor Dow will demonstrate improved functional LE strength by demonstrating 5/5 R quad strength Baseline: 2/5 with quad lag Goal status: IN PROGRESS  8.  Ihor Dow will demonstrate >40/80 on LEFS to demonstrate improved mobility.  Baseline: 23/80 Goal status: IN PROGRESS  PLAN:  PT FREQUENCY: 2x/week  PT DURATION: 6 weeks  PLANNED INTERVENTIONS: Therapeutic exercises, Therapeutic activity, Neuromuscular re-education, Balance training, Gait training, Patient/Family education, Self Care, Joint mobilization, Stair training, Dry Needling, Electrical stimulation, Cryotherapy, Moist heat, Taping, Vasopneumatic device, Ultrasound, Manual therapy, and Re-evaluation  PLAN FOR NEXT SESSION: progress strengthening and ROM as tolerated. Add more standing ex to session; practice weaning from RW to cane    Adler Chartrand, PT 08/24/2022, 6:57 PM

## 2022-08-25 ENCOUNTER — Ambulatory Visit: Payer: Medicare Other

## 2022-08-25 DIAGNOSIS — R262 Difficulty in walking, not elsewhere classified: Secondary | ICD-10-CM

## 2022-08-25 DIAGNOSIS — M25612 Stiffness of left shoulder, not elsewhere classified: Secondary | ICD-10-CM | POA: Diagnosis not present

## 2022-08-25 DIAGNOSIS — M25561 Pain in right knee: Secondary | ICD-10-CM | POA: Diagnosis not present

## 2022-08-25 DIAGNOSIS — M25512 Pain in left shoulder: Secondary | ICD-10-CM | POA: Diagnosis not present

## 2022-08-25 DIAGNOSIS — M25661 Stiffness of right knee, not elsewhere classified: Secondary | ICD-10-CM

## 2022-08-25 DIAGNOSIS — R600 Localized edema: Secondary | ICD-10-CM | POA: Diagnosis not present

## 2022-08-25 NOTE — Therapy (Signed)
OUTPATIENT PHYSICAL THERAPY TREATMENT   Patient Name: [name] MRN: [id number] DOB:07-31-1945, 77 y.o., male Today's Date: 08/25/2022  END OF SESSION:  PT End of Session - 08/25/22 1155     Visit Number 5    Number of Visits 12    Date for PT Re-Evaluation 09/23/22    Authorization Type Medicare + Tric[employer] for Life    PT Start Time 1101    PT Stop Time 1158    PT Time Calculation (min) 57 min    Activity Tolerance Patient tolerated treatment well    Behavior During Therapy [facility] for tasks assessed/performed                Past Medical History:  Diagnosis Date   Abnormal CT scan, gastrointestinal tract suspect angioedema    Anemia 03/16/2016   IRON   Anxiety    Arthritis    Back pain 10/10/2012   Fatty liver 2005   seen on 2005 ultrasound,05/2015 CT.    Hemorrhoids 08/22/2016   Hemorrhoids, internal, with bleeding 2005   internal and external. 2005 band ligation (dr Ferdinand Lango in [facility])   Hyperglycemia 08/21/2013   Hyperlipidemia    Hypertension    Nocturia 08/21/2013   Obesity (BMI 30.0-34.9)    Osteoporosis    Pneumonia    as a child   Rectal bleeding 07/30/2014   Renal cyst    SBO (small bowel obstruction) ([place])    Tubular adenoma of colon before 2005, 2012, 08/2014   Unspecified constipation 02/06/2014   Vitamin D deficiency    Past Surgical History:  Procedure Laterality Date   COLONOSCOPY  before 2005, 2012, 2013, 08/2014.    ENTEROSCOPY N/A 10/05/2017   Procedure: ENTEROSCOPY;  Surgeon: Gatha Mayer, MD;  [facility]: [facility];  Service: Endoscopy;  Laterality: N/A;   FRACTURE SURGERY Left 1989   leg   HEMORRHOID BANDING  2005   IR RADIOLOGIST EVAL & MGMT  11/11/2021   KNEE SURGERY Left 2010   arthroscopy, torn carilage   KNEE SURGERY Right 2012   arthroscopy   TONSILLECTOMY  1957   TONSILLECTOMY     TOTAL KNEE ARTHROPLASTY Right 07/24/2022   Procedure: RIGHT TOTAL KNEE ARTHROPLASTY;  Surgeon: Meredith Pel, MD;  Location: [place];   Service: Orthopedics;  Laterality: Right;   TOTAL SHOULDER ARTHROPLASTY Left 11/01/2020   Procedure: LEFT ANATOMIC SHOULDER REPLACEMENT;  Surgeon: Meredith Pel, MD;  Location: [place];  Service: Orthopedics;  Laterality: Left;   VENTRAL HERNIA REPAIR N/A 06/03/2018   Procedure: LAPAROSCOPIC VENTRAL WALL  HERNIA  REPAIR WITH MESH  ERAS PATHWAY;  Surgeon: Michael Boston, MD;  [facility];  Service: General;  Laterality: N/A;   Patient Active Problem List   Diagnosis Date Noted   Arthritis of right knee 07/27/2022   S/P total knee arthroplasty, right 07/24/2022   Shoulder arthritis    S/P shoulder replacement, 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   Skin tags, multiple acquired 07/07/2019   LUQ pain 07/07/2019   OA (osteoarthritis) of knee 03/16/2019   Ventral hernia s/p lap repair with mesh 06/03/2018 06/03/2018   Hemorrhoids 08/22/2016   Anemia 03/16/2016   Right shoulder pain 02/26/2016   Abnormal CT scan, gastrointestinal tract suspect angioedema    Abdominal pain    Hyperglycemia 08/21/2013   Low back pain 10/10/2012   Hyperlipidemia    Hypertension, essential     Obesity (BMI 30.0-34.9)     PCP: Mosie Lukes, MD   REFERRING PROVIDER: Marcene Duos  Nicki Reaper, MD   REFERRING DIAG: s/p R TKA   THERAPY DIAG:  Stiffness of right knee, not elsewhere classified  Localized edema  Difficulty in walking, not elsewhere classified  Rationale for Evaluation and Treatment: Rehabilitation  ONSET DATE: 07/24/2022  SUBJECTIVE:   SUBJECTIVE STATEMENT: Did a lot of walking over the weekend, was very sore   NEXT MD VISIT: 09/05/2022  PERTINENT HISTORY: History back pain, L TSA, Ventral hernia repair PAIN:  Are you having pain? Yes: NPRS scale: 5/10 Pain location: R knee Pain description: ache Aggravating factors: laying in bed Relieving factors: cold  PRECAUTIONS: None  WEIGHT BEARING RESTRICTIONS: No  FALLS:  Has patient fallen in last 6 months? No  LIVING ENVIRONMENT: Lives with:  lives with their spouse Lives in: House/apartment Stairs: Yes: Internal: 12 steps; on right going up and External: 2 steps; none Has following equipment at home: Walker - 2 wheeled  OCCUPATION: retired   PLOF: Independent and Leisure: golf, yard work, gym- bike  PATIENT GOALS: get back to normal life- walking without pain, playing golf, get back to gym, be active, sit down and not hurt.    OBJECTIVE:   DIAGNOSTIC FINDINGS: 08/08/22 AP, lateral views of right knee reviewed. Right total knee prosthesis in excellent position and alignment without any complicating features. No fracture. No dislocation.   PATIENT SURVEYS:  LEFS 28.75%  COGNITION: Overall cognitive status: Within functional limits for tasks assessed     SENSATION: Occasional numbness/tingling to R foot, but intact to light touch  EDEMA:  Circumferential: R 44.5 cm, L 41.5 cm  MUSCLE LENGTH: Hamstrings: tightness R hamstring, lacking full extension.   POSTURE: weight shift left  PALPATION: Warmth in R knee but no signs of infection, no tenderness in calf, good patellar mobility, good scar mobility.   LOWER EXTREMITY ROM:  Passive ROM Right eval Left eval Right  Right 08/21/22  Knee flexion 100 135 104 110  Knee extension 10 0 6 -4   (Blank rows = not tested)  LOWER EXTREMITY MMT:  MMT Right eval Left eval  Hip flexion 5 5  Hip extension    Hip abduction 5 5  Hip adduction 5 5  Knee flexion    Knee extension 2 (30 deg quad lag) 5  Ankle dorsiflexion    Ankle plantarflexion     (Blank rows = not tested)  LOWER EXTREMITY SPECIAL TESTS:  NT  FUNCTIONAL TESTS:  5 times sit to stand: 13 seconds  GAIT: Distance walked: 50' Assistive device utilized: Environmental consultant - 2 wheeled Level of assistance: Modified independence Comments: decreased weight shift to RLE, lacking R knee extension in stance.     TODAY'S TREATMENT:                                                                                                                               DATE:  08/25/22 Therapeutic Exercise: to improve strength and mobility.  Demo, verbal and tactile cues  throughout for technique. Bike seat 9 up to seat 8 L1  x 9 min Gait with SPC and no AD Standing heel raise 2x10 Functional squat 2x10 Fwd step 8' x 10 RLE Lateral step up 8' x 10 R LE Knee flexion 35# 2X10 BLE; 10# 2x10 RLE Knee extension 20# 2x10BLE; 10# 2x10 RLE Stairs x 14 3 trials; reciprocal pattern  08/21/22 Therapeutic Exercise: to improve strength and mobility.  Demo, verbal and tactile cues throughout for technique. Bike seat 9 up to seat 8 L1  x 7 min  Prone quad set 5 sec hold x 15 Prone knee flex x 10 Knee flexion stretch in supine 2 x 30 sec hold Sit to stands 2 x 10 - with feet even, no UE assist. 8 inch step ups fwd and lat 2x 10 SLS bilaterally x 30 sec ea   Gait in hallway no working on equal step length -   Manual Therapy: to decrease muscle spasm and pain and improve mobility patellar mobs  Vasopneumatic: Gameready at end of session for post session soreness/edema.  10 min, med compression, 34 deg (coldest).   08/18/22 Therapeutic Exercise: to improve strength and mobility.  Demo, verbal and tactile cues throughout for technique. Nustep L5 x 6 min  At counter with bil UE support: Heel raises x 15  Hip extension 2  x 10 bil  Hip abduction 2 x 10 bil  Hamstring curls 2 x 10 bil  Mini-squats 2 x 10  Sit to stands 2 x 10 - with feet even, no UE assist.  Gait without AD x 90' with SBA for safety Knee flexion stretch in supine 2 x 30 sec hold Manual Therapy: to decrease muscle spasm and pain and improve mobility Scar tissue mobilization, patellar mobs, STM to lateral knee Vasopneumatic: Gameready at end of session for post session soreness/edema.  10 min, low compression, 34 deg (coldest).     PATIENT EDUCATION:  Education details: HEP update Person educated: Patient Education method: Explanation, Demonstration,  Verbal cues, and Handouts Education comprehension: verbalized understanding and returned demonstration  HOME EXERCISE PROGRAM: Access Code: [email or web address] URL: [email or web address] Date: 08/18/2022 Prepared by: Glenetta Hew  Exercises - Seated Quad Set  - 2 x daily - 7 x weekly - 1 sets - 10 reps - 5 sec  hold - Standing Quad Set  - 2 x daily - 7 x weekly - 1 sets - 10 reps - 5 sec  hold - [employer] [place]  - 2 x daily - 7 x weekly - 2 sets - 10 reps - Heel Raises with Counter Support  - 1 x daily - 7 x weekly - 2 sets - 10 reps - Standing Hip Abduction with Counter Support  - 1 x daily - 7 x weekly - 2 sets - 10 reps - Standing Hip Extension with Counter Support  - 1 x daily - 7 x weekly - 2 sets - 10 reps - Mini Squat with Counter Support  - 1 x daily - 7 x weekly - 2 sets - 10 reps - Standing Knee Flexion  - 1 x daily - 7 x weekly - 2 sets - 10 reps  ASSESSMENT:  CLINICAL IMPRESSION: Advanced patient through exercises to improve knee strength to improve functional strength. Patient had good response to the progression of exercises. He shows good functional strength with squats being able to do w/o UE support. With the stairs he required cues to descend going straight down rather than abducting hips. He  showed good gait pattern with no AD but did fatigue quickly in his R knee. Concluded session with GR to address swelling and soreness post exercise.  OBJECTIVE IMPAIRMENTS: Abnormal gait, decreased activity tolerance, decreased endurance, decreased mobility, difficulty walking, decreased ROM, decreased strength, increased edema, increased muscle spasms, impaired flexibility, impaired sensation, and pain.   ACTIVITY LIMITATIONS: lifting, bending, sitting, standing, squatting, stairs, transfers, and locomotion level  PARTICIPATION LIMITATIONS: meal prep, cleaning, laundry, driving, shopping, community activity, and yard work  PERSONAL FACTORS: 1-2 comorbidities: osteoporosis,  history back pain  are also affecting patient's functional outcome.   REHAB POTENTIAL: Excellent  CLINICAL DECISION MAKING: Stable/uncomplicated  EVALUATION COMPLEXITY: Low   GOALS: Goals reviewed with patient? Yes   SHORT TERM GOALS: Target date: 08/26/2022    Independent with initial HEP. Baseline: given Goal status: MET 08/18/22- good compliance.    LONG TERM GOALS: Target date: 09/23/2022   Independent with advanced/ongoing HEP to improve outcomes and carryover.  Baseline:  Goal status: IN PROGRESS  2.  Ihor Dow will demonstrate R knee flexion to 120 deg to ascend/descend stairs. Baseline: 100 Goal status: IN PROGRESS 08/18/22- 104  3.  Ihor Dow will demonstrate full R knee extension for safety with gait. Baseline: lacking 10 Goal status: IN PROGRESS 08/18/22- lacking 6  4.  Rithik Shoots will be able to ambulate 600' safely with LRAD and normal gait pattern to access community.  Baseline: 2WRW needed Goal status: IN PROGRESS  08/18/22- 90' without AD  5.  Ihor Dow will be able to ascend/descend stairs with 1 HR and reciprocal step pattern safely to access home and community.  Baseline: HR or walker needed Goal status: IN PROGRESS  6.  Thorsten Benninghoff will demonstrate > 19/24 on DGI to demonstrate decreased risk of falls.   Baseline: NT Goal status: IN PROGRESS  7.  Ihor Dow will demonstrate improved functional LE strength by demonstrating 5/5 R quad strength Baseline: 2/5 with quad lag Goal status: IN PROGRESS  8.  Ihor Dow will demonstrate >40/80 on LEFS to demonstrate improved mobility.  Baseline: 23/80 Goal status: IN PROGRESS  PLAN:  PT FREQUENCY: 2x/week  PT DURATION: 6 weeks  PLANNED INTERVENTIONS: Therapeutic exercises, Therapeutic activity, Neuromuscular re-education, Balance training, Gait training, Patient/Family education, Self Care, Joint mobilization, Stair training, Dry Needling, Electrical stimulation, Cryotherapy, Moist  heat, Taping, Vasopneumatic device, Ultrasound, Manual therapy, and Re-evaluation  PLAN FOR NEXT SESSION: progress strengthening and ROM as tolerated. Add more standing ex to session; practice weaning from RW to cane    [employer], PTA 08/25/2022, 12:05 PM

## 2022-08-28 ENCOUNTER — Ambulatory Visit: Payer: Medicare Other

## 2022-08-28 DIAGNOSIS — M25612 Stiffness of left shoulder, not elsewhere classified: Secondary | ICD-10-CM | POA: Diagnosis not present

## 2022-08-28 DIAGNOSIS — R262 Difficulty in walking, not elsewhere classified: Secondary | ICD-10-CM | POA: Diagnosis not present

## 2022-08-28 DIAGNOSIS — M25661 Stiffness of right knee, not elsewhere classified: Secondary | ICD-10-CM

## 2022-08-28 DIAGNOSIS — R600 Localized edema: Secondary | ICD-10-CM

## 2022-08-28 DIAGNOSIS — M25512 Pain in left shoulder: Secondary | ICD-10-CM | POA: Diagnosis not present

## 2022-08-28 DIAGNOSIS — M25561 Pain in right knee: Secondary | ICD-10-CM | POA: Diagnosis not present

## 2022-08-28 NOTE — Therapy (Signed)
OUTPATIENT PHYSICAL THERAPY TREATMENT   Patient Name: [name] MRN: [id number] DOB:[birth year], 77 y.o., male Today's Date: 08/28/2022  END OF SESSION:  PT End of Session - 08/28/22 1059     Visit Number 6    Number of Visits 12    Date for PT Re-Evaluation 09/23/22    Authorization Type Medicare + Tricare for Life    PT Start Time 1013    PT Stop Time 1101    PT Time Calculation (min) 48 min    Activity Tolerance Patient tolerated treatment well    Behavior During Therapy [facility] for tasks assessed/performed                 Past Medical History:  Diagnosis Date   Abnormal CT scan, gastrointestinal tract suspect angioedema    Anemia 03/16/2016   IRON   Anxiety    Arthritis    Back pain 10/10/2012   Fatty liver 2005   seen on 2005 ultrasound,05/2015 CT.    Hemorrhoids 08/22/2016   Hemorrhoids, internal, with bleeding 2005   internal and external. 2005 band ligation (dr Ferdinand Lango in [facility])   Hyperglycemia 08/21/2013   Hyperlipidemia    Hypertension    Nocturia 08/21/2013   Obesity (BMI 30.0-34.9)    Osteoporosis    Pneumonia    as a child   Rectal bleeding 07/30/2014   Renal cyst    SBO (small bowel obstruction) ([place])    Tubular adenoma of colon before 2005, 2012, 08/2014   Unspecified constipation 02/06/2014   Vitamin D deficiency    Past Surgical History:  Procedure Laterality Date   COLONOSCOPY  before 2005, 2012, 2013, 08/2014.    ENTEROSCOPY N/A 10/05/2017   Procedure: ENTEROSCOPY;  Surgeon: Gatha Mayer, MD;  [facility];  Service: Endoscopy;  Laterality: N/A;   FRACTURE SURGERY Left 1989   leg   HEMORRHOID BANDING  2005   IR RADIOLOGIST EVAL & MGMT  11/11/2021   KNEE SURGERY Left 2010   arthroscopy, torn carilage   KNEE SURGERY Right 2012   arthroscopy   TONSILLECTOMY  1957   TONSILLECTOMY     TOTAL KNEE ARTHROPLASTY Right 07/24/2022   Procedure: RIGHT TOTAL KNEE ARTHROPLASTY;  Surgeon: Meredith Pel, MD;  Location: [place];   Service: Orthopedics;  Laterality: Right;   TOTAL SHOULDER ARTHROPLASTY Left 11/01/2020   Procedure: LEFT ANATOMIC SHOULDER REPLACEMENT;  Surgeon: Meredith Pel, MD;  Location: [place];  Service: Orthopedics;  Laterality: Left;   VENTRAL HERNIA REPAIR N/A 06/03/2018   Procedure: LAPAROSCOPIC VENTRAL WALL  HERNIA  REPAIR WITH MESH  ERAS PATHWAY;  Surgeon: Michael Boston, MD;  [facility];  Service: General;  Laterality: N/A;   Patient Active Problem List   Diagnosis Date Noted   Arthritis of right knee 07/27/2022   S/P total knee arthroplasty, right 07/24/2022   Shoulder arthritis    S/P shoulder replacement, 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   Skin tags, multiple acquired 07/07/2019   LUQ pain 07/07/2019   OA (osteoarthritis) of knee 03/16/2019   Ventral hernia s/p lap repair with mesh 06/03/2018 06/03/2018   Hemorrhoids 08/22/2016   Anemia 03/16/2016   Right shoulder pain 02/26/2016   Abnormal CT scan, gastrointestinal tract suspect angioedema    Abdominal pain    Hyperglycemia 08/21/2013   Low back pain 10/10/2012   Hyperlipidemia    Hypertension, essential     Obesity (BMI 30.0-34.9)     PCP: Mosie Lukes, MD   REFERRING PROVIDER: Marlou Sa,  Tonna Corner, MD   REFERRING DIAG: s/p R TKA   THERAPY DIAG:  Stiffness of right knee, not elsewhere classified  Localized edema  Difficulty in walking, not elsewhere classified  Rationale for Evaluation and Treatment: Rehabilitation  ONSET DATE: 07/24/2022  SUBJECTIVE:   SUBJECTIVE STATEMENT: Pt reports feeling tight in R knee, more aggravating than pain.   NEXT MD VISIT: 09/05/2022  PERTINENT HISTORY: History back pain, L TSA, Ventral hernia repair PAIN:  Are you having pain? Yes: NPRS scale: 4/10 Pain location: R knee Pain description: ache Aggravating factors: laying in bed Relieving factors: cold  PRECAUTIONS: None  WEIGHT BEARING RESTRICTIONS: No  FALLS:  Has patient fallen in last 6 months? No  LIVING  ENVIRONMENT: Lives with: lives with their spouse Lives in: House/apartment Stairs: Yes: Internal: 12 steps; on right going up and External: 2 steps; none Has following equipment at home: Walker - 2 wheeled  OCCUPATION: retired   PLOF: Independent and Leisure: golf, yard work, gym- bike  PATIENT GOALS: get back to normal life- walking without pain, playing golf, get back to gym, be active, sit down and not hurt.    OBJECTIVE:   DIAGNOSTIC FINDINGS: 08/08/22 AP, lateral views of right knee reviewed. Right total knee prosthesis in excellent position and alignment without any complicating features. No fracture. No dislocation.   PATIENT SURVEYS:  LEFS 28.75%  COGNITION: Overall cognitive status: Within functional limits for tasks assessed     SENSATION: Occasional numbness/tingling to R foot, but intact to light touch  EDEMA:  Circumferential: R 44.5 cm, L 41.5 cm  MUSCLE LENGTH: Hamstrings: tightness R hamstring, lacking full extension.   POSTURE: weight shift left  PALPATION: Warmth in R knee but no signs of infection, no tenderness in calf, good patellar mobility, good scar mobility.   LOWER EXTREMITY ROM:  Passive ROM Right eval Left eval Right  Right 08/21/22 Right 08/28/22  Knee flexion 100 135 104 110 118-sitting  Knee extension 10 0 6 -4 6 LAQ 2-supine   (Blank rows = not tested)  LOWER EXTREMITY MMT:  MMT Right eval Left eval  Hip flexion 5 5  Hip extension    Hip abduction 5 5  Hip adduction 5 5  Knee flexion    Knee extension 2 (30 deg quad lag) 5  Ankle dorsiflexion    Ankle plantarflexion     (Blank rows = not tested)  LOWER EXTREMITY SPECIAL TESTS:  NT  FUNCTIONAL TESTS:  5 times sit to stand: 13 seconds  GAIT: Distance walked: 50' Assistive device utilized: Environmental consultant - 2 wheeled Level of assistance: Modified independence Comments: decreased weight shift to RLE, lacking R knee extension in stance.     TODAY'S TREATMENT:                                                                                                                               DATE:  08/28/22 Therapeutic Exercise: to improve strength and  mobility.  Demo, verbal and tactile cues throughout for technique. Bike seat 5 L2x34mn DGI: 23/24 Eccentric step down R 4' step x 10 Step up 8' with L toe tap going down 2x10 Lateral step up 8' L toe tap going down 2x10 Single leg RDL 2x10 Supine quad stretch x 30 sec w/ strap Supine ITB stretch with strap x 30 sec Manual Therapy: to decrease muscle spasm, pain and improve mobility.  IASTM to R ITB, VL, lateral hamstrings  08/25/22 Therapeutic Exercise: to improve strength and mobility.  Demo, verbal and tactile cues throughout for technique. Bike seat 9 up to seat 8 L1  x 9 min Gait with SPC and no AD Standing heel raise 2x10 Functional squat 2x10 Fwd step 8' x 10 RLE Lateral step up 8' x 10 R LE Knee flexion 35# 2X10 BLE; 10# 2x10 RLE Knee extension 20# 2x10BLE; 10# 2x10 RLE Stairs x 14 3 trials; reciprocal pattern  08/21/22 Therapeutic Exercise: to improve strength and mobility.  Demo, verbal and tactile cues throughout for technique. Bike seat 9 up to seat 8 L1  x 7 min  Prone quad set 5 sec hold x 15 Prone knee flex x 10 Knee flexion stretch in supine 2 x 30 sec hold Sit to stands 2 x 10 - with feet even, no UE assist. 8 inch step ups fwd and lat 2x 10 SLS bilaterally x 30 sec ea   Gait in hallway no working on equal step length -   Manual Therapy: to decrease muscle spasm and pain and improve mobility patellar mobs  Vasopneumatic: Gameready at end of session for post session soreness/edema.  10 min, med compression, 34 deg (coldest).   08/18/22 Therapeutic Exercise: to improve strength and mobility.  Demo, verbal and tactile cues throughout for technique. Nustep L5 x 6 min  At counter with bil UE support: Heel raises x 15  Hip extension 2  x 10 bil  Hip abduction 2 x 10 bil  Hamstring curls 2 x 10  bil  Mini-squats 2 x 10  Sit to stands 2 x 10 - with feet even, no UE assist.  Gait without AD x 90' with SBA for safety Knee flexion stretch in supine 2 x 30 sec hold Manual Therapy: to decrease muscle spasm and pain and improve mobility Scar tissue mobilization, patellar mobs, STM to lateral knee Vasopneumatic: Gameready at end of session for post session soreness/edema.  10 min, low compression, 34 deg (coldest).     PATIENT EDUCATION:  Education details: HEP update Person educated: Patient Education method: Explanation, Demonstration, Verbal cues, and Handouts Education comprehension: verbalized understanding and returned demonstration  HOME EXERCISE PROGRAM: Access Code: [email or web address]: [email or web address] Date: 08/18/2022 Prepared by: EGlenetta Hew Exercises - Seated Quad Set  - 2 x daily - 7 x weekly - 1 sets - 10 reps - 5 sec  hold - Standing Quad Set  - 2 x daily - 7 x weekly - 1 sets - 10 reps - 5 sec  hold - [employer]  - 2 x daily - 7 x weekly - 2 sets - 10 reps - Heel Raises with Counter Support  - 1 x daily - 7 x weekly - 2 sets - 10 reps - Standing Hip Abduction with Counter Support  - 1 x daily - 7 x weekly - 2 sets - 10 reps - Standing Hip Extension with Counter Support  - 1 x daily - 7 x weekly - 2 sets - 10  reps - Mini Squat with Counter Support  - 1 x daily - 7 x weekly - 2 sets - 10 reps - Standing Knee Flexion  - 1 x daily - 7 x weekly - 2 sets - 10 reps  ASSESSMENT:  CLINICAL IMPRESSION: Advanced patient through exercises to improve knee strength. She was able to score 23/24 DGI, meeting LTG #5. He shows some issues with eccentric control of R quads with step downs and step ups. Cues required with single RDL to hinge hips rather than lunging motion. R knee ROM was 2-118 deg today, showing good improvement. Pt declined GR this session.  OBJECTIVE IMPAIRMENTS: Abnormal gait, decreased activity tolerance, decreased endurance, decreased  mobility, difficulty walking, decreased ROM, decreased strength, increased edema, increased muscle spasms, impaired flexibility, impaired sensation, and pain.   ACTIVITY LIMITATIONS: lifting, bending, sitting, standing, squatting, stairs, transfers, and locomotion level  PARTICIPATION LIMITATIONS: meal prep, cleaning, laundry, driving, shopping, community activity, and yard work  PERSONAL FACTORS: 1-2 comorbidities: osteoporosis, history back pain  are also affecting patient's functional outcome.   REHAB POTENTIAL: Excellent  CLINICAL DECISION MAKING: Stable/uncomplicated  EVALUATION COMPLEXITY: Low   GOALS: Goals reviewed with patient? Yes   SHORT TERM GOALS: Target date: 08/26/2022    Independent with initial HEP. Baseline: given Goal status: MET 08/18/22- good compliance.    LONG TERM GOALS: Target date: 09/23/2022   Independent with advanced/ongoing HEP to improve outcomes and carryover.  Baseline:  Goal status: IN PROGRESS  2.  Ihor Dow will demonstrate R knee flexion to 120 deg to ascend/descend stairs. Baseline: 100 Goal status: IN PROGRESS 08/18/22- 104  3.  Ihor Dow will demonstrate full R knee extension for safety with gait. Baseline: lacking 10 Goal status: IN PROGRESS 08/18/22- lacking 6  4.  Breaker Capito will be able to ambulate 600' safely with LRAD and normal gait pattern to access community.  Baseline: 2WRW needed Goal status: IN PROGRESS  08/18/22- 90' without AD  5.  Ihor Dow will be able to ascend/descend stairs with 1 HR and reciprocal step pattern safely to access home and community.  Baseline: HR or walker needed Goal status: IN PROGRESS  6.  Yousof Midgley will demonstrate > 19/24 on DGI to demonstrate decreased risk of falls.   Baseline: NT Goal status: MET- 08/28/22 23/24  7.  Ihor Dow will demonstrate improved functional LE strength by demonstrating 5/5 R quad strength Baseline: 2/5 with quad lag Goal status: IN  PROGRESS  8.  Ihor Dow will demonstrate >40/80 on LEFS to demonstrate improved mobility.  Baseline: 23/80 Goal status: IN PROGRESS  PLAN:  PT FREQUENCY: 2x/week  PT DURATION: 6 weeks  PLANNED INTERVENTIONS: Therapeutic exercises, Therapeutic activity, Neuromuscular re-education, Balance training, Gait training, Patient/Family education, Self Care, Joint mobilization, Stair training, Dry Needling, Electrical stimulation, Cryotherapy, Moist heat, Taping, Vasopneumatic device, Ultrasound, Manual therapy, and Re-evaluation  PLAN FOR NEXT SESSION: eccentric quad strength; weight machines    Rayden Dock L Aashish Hamm, PTA 08/28/2022, 11:04 AM

## 2022-09-01 ENCOUNTER — Ambulatory Visit: Payer: Medicare Other | Attending: Orthopedic Surgery

## 2022-09-01 DIAGNOSIS — R262 Difficulty in walking, not elsewhere classified: Secondary | ICD-10-CM | POA: Diagnosis not present

## 2022-09-01 DIAGNOSIS — R600 Localized edema: Secondary | ICD-10-CM | POA: Insufficient documentation

## 2022-09-01 DIAGNOSIS — M25661 Stiffness of right knee, not elsewhere classified: Secondary | ICD-10-CM | POA: Insufficient documentation

## 2022-09-01 DIAGNOSIS — M25561 Pain in right knee: Secondary | ICD-10-CM | POA: Insufficient documentation

## 2022-09-01 DIAGNOSIS — M6281 Muscle weakness (generalized): Secondary | ICD-10-CM | POA: Insufficient documentation

## 2022-09-01 NOTE — Therapy (Signed)
OUTPATIENT PHYSICAL THERAPY TREATMENT   Patient Name: [name] MRN: [id number] DOB:September 13, 1945, 77 y.o., male Today's Date: 09/01/2022  END OF SESSION:  PT End of Session - 09/01/22 1155     Visit Number 7    Number of Visits 12    Date for PT Re-Evaluation 09/23/22    Authorization Type Medicare + [employer]    PT Start Time 1104    PT Stop Time 1146    PT Time Calculation (min) 42 min    Activity Tolerance Patient tolerated treatment well    Behavior During Therapy [facility] for tasks assessed/performed                  Past Medical History:  Diagnosis Date   Abnormal CT scan, gastrointestinal tract suspect angioedema    Anemia 03/16/2016   IRON   Anxiety    Arthritis    Back pain 10/10/2012   Fatty liver 2005   seen on 2005 ultrasound,05/2015 CT.    Hemorrhoids 08/22/2016   Hemorrhoids, internal, with bleeding 2005   internal and external. 2005 band ligation (dr Ferdinand Lango in [facility])   Hyperglycemia 08/21/2013   Hyperlipidemia    Hypertension    Nocturia 08/21/2013   Obesity (BMI 30.0-34.9)    Osteoporosis    Pneumonia    as a child   Rectal bleeding 07/30/2014   Renal cyst    SBO (small bowel obstruction) ([place])    Tubular adenoma of colon before 2005, 2012, 08/2014   Unspecified constipation 02/06/2014   Vitamin D deficiency    Past Surgical History:  Procedure Laterality Date   COLONOSCOPY  before 2005, 2012, 2013, 08/2014.    ENTEROSCOPY N/A 10/05/2017   Procedure: ENTEROSCOPY;  Surgeon: Gatha Mayer, MD;  Location: [facility];  Service: Endoscopy;  Laterality: N/A;   FRACTURE SURGERY Left 1989   leg   HEMORRHOID BANDING  2005   IR RADIOLOGIST EVAL & MGMT  11/11/2021   KNEE SURGERY Left 2010   arthroscopy, torn carilage   KNEE SURGERY Right 2012   arthroscopy   TONSILLECTOMY  1957   TONSILLECTOMY     TOTAL KNEE ARTHROPLASTY Right 07/24/2022   Procedure: RIGHT TOTAL KNEE ARTHROPLASTY;  Surgeon: Meredith Pel, MD;  Location: [place];   Service: Orthopedics;  Laterality: Right;   TOTAL SHOULDER ARTHROPLASTY Left 11/01/2020   Procedure: LEFT ANATOMIC SHOULDER REPLACEMENT;  Surgeon: Meredith Pel, MD;  Location: [place];  Service: Orthopedics;  Laterality: Left;   VENTRAL HERNIA REPAIR N/A 06/03/2018   Procedure: LAPAROSCOPIC VENTRAL WALL  HERNIA  REPAIR WITH MESH  ERAS PATHWAY;  Surgeon: Michael Boston, MD;  Location: [facility];  Service: General;  Laterality: N/A;   Patient Active Problem List   Diagnosis Date Noted   Arthritis of right knee 07/27/2022   S/P total knee arthroplasty, right 07/24/2022   Shoulder arthritis    S/P shoulder replacement, 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   Skin tags, multiple acquired 07/07/2019   LUQ pain 07/07/2019   OA (osteoarthritis) of knee 03/16/2019   Ventral hernia s/p lap repair with mesh 06/03/2018 06/03/2018   Hemorrhoids 08/22/2016   Anemia 03/16/2016   Right shoulder pain 02/26/2016   Abnormal CT scan, gastrointestinal tract suspect angioedema    Abdominal pain    Hyperglycemia 08/21/2013   Low back pain 10/10/2012   Hyperlipidemia    Hypertension, essential     Obesity (BMI 30.0-34.9)     PCP: Mosie Lukes, MD   REFERRING PROVIDER:  Meredith Pel, MD   REFERRING DIAG: s/p R TKA   THERAPY DIAG:  Stiffness of right knee, not elsewhere classified  Localized edema  Difficulty in walking, not elsewhere classified  Rationale for Evaluation and Treatment: Rehabilitation  ONSET DATE: 07/24/2022  SUBJECTIVE:   SUBJECTIVE STATEMENT: Pt reports he was hurting over the weekend but he still tried to remain active, the massage from last time felt really good.  NEXT MD VISIT: 09/05/2022  PERTINENT HISTORY: History back pain, L TSA, Ventral hernia repair PAIN:  Are you having pain? Yes: NPRS scale: 4/10 Pain location: R medial knee Pain description: ache Aggravating factors: laying in bed Relieving factors: cold  PRECAUTIONS: None  WEIGHT BEARING RESTRICTIONS: No  FALLS:   Has patient fallen in last 6 months? No  LIVING ENVIRONMENT: Lives with: lives with their spouse Lives in: House/apartment Stairs: Yes: Internal: 12 steps; on right going up and External: 2 steps; none Has following equipment at home: Walker - 2 wheeled  OCCUPATION: retired   PLOF: Independent and Leisure: golf, yard work, gym- bike  PATIENT GOALS: get back to normal life- walking without pain, playing golf, get back to gym, be active, sit down and not hurt.    OBJECTIVE:   DIAGNOSTIC FINDINGS: 08/08/22 AP, lateral views of right knee reviewed. Right total knee prosthesis in excellent position and alignment without any complicating features. No fracture. No dislocation.   PATIENT SURVEYS:  LEFS 28.75%  COGNITION: Overall cognitive status: Within functional limits for tasks assessed     SENSATION: Occasional numbness/tingling to R foot, but intact to light touch  EDEMA:  Circumferential: R 44.5 cm, L 41.5 cm  MUSCLE LENGTH: Hamstrings: tightness R hamstring, lacking full extension.   POSTURE: weight shift left  PALPATION: Warmth in R knee but no signs of infection, no tenderness in calf, good patellar mobility, good scar mobility.   LOWER EXTREMITY ROM:  Passive ROM Right eval Left eval Right  Right 08/21/22 Right 08/28/22 Right 09/01/22  Knee flexion 100 135 104 110 118-sitting 120  Knee extension 10 0 6 -4 6 LAQ 2-supine 4 LAQ 1 deg in supine   (Blank rows = not tested)  LOWER EXTREMITY MMT:  MMT Right eval Left eval  Hip flexion 5 5  Hip extension    Hip abduction 5 5  Hip adduction 5 5  Knee flexion    Knee extension 2 (30 deg quad lag) 5  Ankle dorsiflexion    Ankle plantarflexion     (Blank rows = not tested)  LOWER EXTREMITY SPECIAL TESTS:  NT  FUNCTIONAL TESTS:  5 times sit to stand: 13 seconds  GAIT: Distance walked: 50' Assistive device utilized: Environmental consultant - 2 wheeled Level of assistance: Modified independence Comments: decreased weight  shift to RLE, lacking R knee extension in stance.     TODAY'S TREATMENT:  DATE:  09/01/22 Therapeutic Exercise: to improve strength and mobility.  Demo, verbal and tactile cues throughout for technique. Bike seat 5 L2x73mn Step ups R 8' x 10  Step ups lateral  R 8' x 10  Step downs LLE 6' x 10 UE support R SLS x 1 min Single leg squat R x 10 Knee flexion 35# 2x10 BLE; RLE 15# x 10 Knee extension 20# 2x10 BLE: R LE 5# x 10  Manual Therapy: to decrease muscle spasm, pain and improve mobility IASTM to R ITB, VL,  with rolling stick  08/28/22 Therapeutic Exercise: to improve strength and mobility.  Demo, verbal and tactile cues throughout for technique. Bike seat 5 L2x649m DGI: 23/24 Eccentric step down R 4' step x 10 Step up 8' with L toe tap going down 2x10 Lateral step up 8' L toe tap going down 2x10 Single leg RDL 2x10 Supine quad stretch x 30 sec w/ strap Supine ITB stretch with strap x 30 sec Manual Therapy: to decrease muscle spasm, pain and improve mobility.  IASTM to R ITB, VL, lateral hamstrings  08/25/22 Therapeutic Exercise: to improve strength and mobility.  Demo, verbal and tactile cues throughout for technique. Bike seat 9 up to seat 8 L1  x 9 min Gait with SPC and no AD Standing heel raise 2x10 Functional squat 2x10 Fwd step 8' x 10 RLE Lateral step up 8' x 10 R LE Knee flexion 35# 2X10 BLE; 10# 2x10 RLE Knee extension 20# 2x10BLE; 10# 2x10 RLE Stairs x 14 3 trials; reciprocal pattern  08/21/22 Therapeutic Exercise: to improve strength and mobility.  Demo, verbal and tactile cues throughout for technique. Bike seat 9 up to seat 8 L1  x 7 min  Prone quad set 5 sec hold x 15 Prone knee flex x 10 Knee flexion stretch in supine 2 x 30 sec hold Sit to stands 2 x 10 - with feet even, no UE assist. 8 inch step ups fwd and lat 2x 10 SLS  bilaterally x 30 sec ea   Gait in hallway no working on equal step length -   Manual Therapy: to decrease muscle spasm and pain and improve mobility patellar mobs  Vasopneumatic: Gameready at end of session for post session soreness/edema.  10 min, med compression, 34 deg (coldest).   08/18/22 Therapeutic Exercise: to improve strength and mobility.  Demo, verbal and tactile cues throughout for technique. Nustep L5 x 6 min  At counter with bil UE support: Heel raises x 15  Hip extension 2  x 10 bil  Hip abduction 2 x 10 bil  Hamstring curls 2 x 10 bil  Mini-squats 2 x 10  Sit to stands 2 x 10 - with feet even, no UE assist.  Gait without AD x 90' with SBA for safety Knee flexion stretch in supine 2 x 30 sec hold Manual Therapy: to decrease muscle spasm and pain and improve mobility Scar tissue mobilization, patellar mobs, STM to lateral knee Vasopneumatic: Gameready at end of session for post session soreness/edema.  10 min, low compression, 34 deg (coldest).     PATIENT EDUCATION:  Education details: HEP update Person educated: Patient Education method: Explanation, Demonstration, Verbal cues, and Handouts Education comprehension: verbalized understanding and returned demonstration  HOME EXERCISE PROGRAM: Access Code: [email or web address]: [email or web address] Date: 08/18/2022 Prepared by: ElGlenetta HewExercises - Seated Quad Set  - 2 x daily - 7 x weekly - 1 sets - 10 reps - 5 sec  hold -  Standing Quad Set  - 2 x daily - 7 x weekly - 1 sets - 10 reps - 5 sec  hold - [place]  - 2 x daily - 7 x weekly - 2 sets - 10 reps - Heel Raises with Counter Support  - 1 x daily - 7 x weekly - 2 sets - 10 reps - Standing Hip Abduction with Counter Support  - 1 x daily - 7 x weekly - 2 sets - 10 reps - Standing Hip Extension with Counter Support  - 1 x daily - 7 x weekly - 2 sets - 10 reps - Mini Squat with Counter Support  - 1 x daily - 7 x weekly - 2 sets - 10 reps -  Standing Knee Flexion  - 1 x daily - 7 x weekly - 2 sets - 10 reps  ASSESSMENT:  CLINICAL IMPRESSION: Mr. Edelstein showed a good response to the progression of exercises. We continued to focus on WB exercise to improve function of R knee with weight machines for progressive resistance. He was able to do R SLS for 1 min today. He remains tight in the lateral quads and ITB but improved from last visit. R knee AROM measured 1-20 deg, meeting LTG #2. He is doing well walking w/o AD and even was able to weed whack his lawn this past weekend.  OBJECTIVE IMPAIRMENTS: Abnormal gait, decreased activity tolerance, decreased endurance, decreased mobility, difficulty walking, decreased ROM, decreased strength, increased edema, increased muscle spasms, impaired flexibility, impaired sensation, and pain.   ACTIVITY LIMITATIONS: lifting, bending, sitting, standing, squatting, stairs, transfers, and locomotion level  PARTICIPATION LIMITATIONS: meal prep, cleaning, laundry, driving, shopping, community activity, and yard work  PERSONAL FACTORS: 1-2 comorbidities: osteoporosis, history back pain  are also affecting patient's functional outcome.   REHAB POTENTIAL: Excellent  CLINICAL DECISION MAKING: Stable/uncomplicated  EVALUATION COMPLEXITY: Low   GOALS: Goals reviewed with patient? Yes   SHORT TERM GOALS: Target date: 08/26/2022    Independent with initial HEP. Baseline: given Goal status: MET 08/18/22- good compliance.    LONG TERM GOALS: Target date: 09/23/2022   Independent with advanced/ongoing HEP to improve outcomes and carryover.  Baseline:  Goal status: IN PROGRESS  2.  Ihor Dow will demonstrate R knee flexion to 120 deg to ascend/descend stairs. Baseline: 100 Goal status: MET 09/01/22 - 120 deg  3.  Ihor Dow will demonstrate full R knee extension for safety with gait. Baseline: lacking 10 Goal status: IN PROGRESS 08/18/22- lacking 6  4.  Ronte Graveline will be able to ambulate  600' safely with LRAD and normal gait pattern to access community.  Baseline: 2WRW needed Goal status: IN PROGRESS  08/18/22- 90' without AD  5.  Ihor Dow will be able to ascend/descend stairs with 1 HR and reciprocal step pattern safely to access home and community.  Baseline: HR or walker needed Goal status: IN PROGRESS  6.  Orry Mancini will demonstrate > 19/24 on DGI to demonstrate decreased risk of falls.   Baseline: NT Goal status: MET- 08/28/22 23/24  7.  Ihor Dow will demonstrate improved functional LE strength by demonstrating 5/5 R quad strength Baseline: 2/5 with quad lag Goal status: IN PROGRESS  8.  Ihor Dow will demonstrate >40/80 on LEFS to demonstrate improved mobility.  Baseline: 23/80 Goal status: IN PROGRESS  PLAN:  PT FREQUENCY: 2x/week  PT DURATION: 6 weeks  PLANNED INTERVENTIONS: Therapeutic exercises, Therapeutic activity, Neuromuscular re-education, Balance training, Gait training, Patient/Family education, Self Care,  Joint mobilization, Stair training, Dry Needling, Electrical stimulation, Cryotherapy, Moist heat, Taping, Vasopneumatic device, Ultrasound, Manual therapy, and Re-evaluation  PLAN FOR NEXT SESSION: eccentric quad strength; balance; weight machines    Artist Pais, PTA 09/01/2022, 11:56 AM

## 2022-09-03 ENCOUNTER — Ambulatory Visit (INDEPENDENT_AMBULATORY_CARE_PROVIDER_SITE_OTHER): Payer: Medicare Other | Admitting: Orthopedic Surgery

## 2022-09-03 DIAGNOSIS — Z96651 Presence of right artificial knee joint: Secondary | ICD-10-CM

## 2022-09-04 ENCOUNTER — Telehealth: Payer: Self-pay | Admitting: *Deleted

## 2022-09-04 ENCOUNTER — Other Ambulatory Visit: Payer: Self-pay | Admitting: Orthopedic Surgery

## 2022-09-04 ENCOUNTER — Ambulatory Visit: Payer: Medicare Other | Admitting: Physical Therapy

## 2022-09-04 ENCOUNTER — Encounter: Payer: Self-pay | Admitting: Physical Therapy

## 2022-09-04 DIAGNOSIS — R262 Difficulty in walking, not elsewhere classified: Secondary | ICD-10-CM | POA: Diagnosis not present

## 2022-09-04 DIAGNOSIS — R600 Localized edema: Secondary | ICD-10-CM | POA: Diagnosis not present

## 2022-09-04 DIAGNOSIS — M6281 Muscle weakness (generalized): Secondary | ICD-10-CM

## 2022-09-04 DIAGNOSIS — M25661 Stiffness of right knee, not elsewhere classified: Secondary | ICD-10-CM

## 2022-09-04 DIAGNOSIS — M25561 Pain in right knee: Secondary | ICD-10-CM

## 2022-09-04 MED ORDER — GABAPENTIN 300 MG PO CAPS: 300.0000 mg | ORAL_CAPSULE | Freq: Three times a day (TID) | ORAL | 0 refills | Status: DC

## 2022-09-04 NOTE — Telephone Encounter (Signed)
Sent thx

## 2022-09-04 NOTE — Telephone Encounter (Signed)
Patient called and asked about an Rx of Gabapentin you were going to call him for him from his visit yesterday. Pharmacy on chart- [facility]. Thank you.

## 2022-09-04 NOTE — Therapy (Signed)
OUTPATIENT PHYSICAL THERAPY TREATMENT   Patient Name: [name] MRN: [id number] DOB:1945/07/30, 77 y.o., male Today's Date: 09/04/2022  END OF SESSION:  PT End of Session - 09/04/22 1015     Visit Number 8    Number of Visits 12    Date for PT Re-Evaluation 09/23/22    Authorization Type Medicare + [employer]    PT Start Time 1015    PT Stop Time 1059    PT Time Calculation (min) 44 min    Activity Tolerance Patient tolerated treatment well    Behavior During Therapy [facility] for tasks assessed/performed                  Past Medical History:  Diagnosis Date   Abnormal CT scan, gastrointestinal tract suspect angioedema    Anemia 03/16/2016   IRON   Anxiety    Arthritis    Back pain 10/10/2012   Fatty liver 2005   seen on 2005 ultrasound,05/2015 CT.    Hemorrhoids 08/22/2016   Hemorrhoids, internal, with bleeding 2005   internal and external. 2005 band ligation (dr Ferdinand Lango in [facility])   Hyperglycemia 08/21/2013   Hyperlipidemia    Hypertension    Nocturia 08/21/2013   Obesity (BMI 30.0-34.9)    Osteoporosis    Pneumonia    as a child   Rectal bleeding 07/30/2014   Renal cyst    SBO (small bowel obstruction) ([place])    Tubular adenoma of colon before 2005, 2012, 08/2014   Unspecified constipation 02/06/2014   Vitamin D deficiency    Past Surgical History:  Procedure Laterality Date   COLONOSCOPY  before 2005, 2012, 2013, 08/2014.    ENTEROSCOPY N/A 10/05/2017   Procedure: ENTEROSCOPY;  Surgeon: Gatha Mayer, MD;  Location: [facility];  Service: Endoscopy;  Laterality: N/A;   FRACTURE SURGERY Left 1989   leg   HEMORRHOID BANDING  2005   IR RADIOLOGIST EVAL & MGMT  11/11/2021   KNEE SURGERY Left 2010   arthroscopy, torn carilage   KNEE SURGERY Right 2012   arthroscopy   TONSILLECTOMY  1957   TONSILLECTOMY     TOTAL KNEE ARTHROPLASTY Right 07/24/2022   Procedure: RIGHT TOTAL KNEE ARTHROPLASTY;  Surgeon: Meredith Pel, MD;  Location: [place];   Service: Orthopedics;  Laterality: Right;   TOTAL SHOULDER ARTHROPLASTY Left 11/01/2020   Procedure: LEFT ANATOMIC SHOULDER REPLACEMENT;  Surgeon: Meredith Pel, MD;  Location: [place];  Service: Orthopedics;  Laterality: Left;   VENTRAL HERNIA REPAIR N/A 06/03/2018   Procedure: LAPAROSCOPIC VENTRAL WALL  HERNIA  REPAIR WITH MESH  ERAS PATHWAY;  Surgeon: Michael Boston, MD;  Location: [facility];  Service: General;  Laterality: N/A;   Patient Active Problem List   Diagnosis Date Noted   Arthritis of right knee 07/27/2022   S/P total knee arthroplasty, right 07/24/2022   Shoulder arthritis    S/P shoulder replacement, 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   Skin tags, multiple acquired 07/07/2019   LUQ pain 07/07/2019   OA (osteoarthritis) of knee 03/16/2019   Ventral hernia s/p lap repair with mesh 06/03/2018 06/03/2018   Hemorrhoids 08/22/2016   Anemia 03/16/2016   Right shoulder pain 02/26/2016   Abnormal CT scan, gastrointestinal tract suspect angioedema    Abdominal pain    Hyperglycemia 08/21/2013   Low back pain 10/10/2012   Hyperlipidemia    Hypertension, essential     Obesity (BMI 30.0-34.9)     PCP: Mosie Lukes, MD   REFERRING PROVIDER:  Meredith Pel, MD   REFERRING DIAG: s/p R TKA   THERAPY DIAG:  Stiffness of right knee, not elsewhere classified  Localized edema  Difficulty in walking, not elsewhere classified  Muscle weakness (generalized)  Acute pain of right knee  Rationale for Evaluation and Treatment: Rehabilitation  ONSET DATE: 07/24/2022  SUBJECTIVE:   SUBJECTIVE STATEMENT: Said Dr. Rockey Situ him he was in the top 1% for recovery.  Foot bothering him more than knee, trouble sleeping at night.  Returns to [place] in 6 weeks, should return to golf by April  NEXT MD VISIT: returns in 6 weeks   PERTINENT HISTORY: History back pain, L TSA, Ventral hernia repair PAIN:  Are you having pain? Yes: NPRS scale: 3.5/10 Pain location: R medial knee Pain description:  ache Aggravating factors: laying in bed Relieving factors: cold  PRECAUTIONS: None  WEIGHT BEARING RESTRICTIONS: No  FALLS:  Has patient fallen in last 6 months? No  LIVING ENVIRONMENT: Lives with: lives with their spouse Lives in: House/apartment Stairs: Yes: Internal: 12 steps; on right going up and External: 2 steps; none Has following equipment at home: Walker - 2 wheeled  OCCUPATION: retired   PLOF: Independent and Leisure: golf, yard work, gym- bike  PATIENT GOALS: get back to normal life- walking without pain, playing golf, get back to gym, be active, sit down and not hurt.    OBJECTIVE:   DIAGNOSTIC FINDINGS: 08/08/22 AP, lateral views of right knee reviewed. Right total knee prosthesis in excellent position and alignment without any complicating features. No fracture. No dislocation.   PATIENT SURVEYS:  LEFS 28.75%  COGNITION: Overall cognitive status: Within functional limits for tasks assessed     SENSATION: Occasional numbness/tingling to R foot, but intact to light touch  EDEMA:  Circumferential: R 44.5 cm, L 41.5 cm  MUSCLE LENGTH: Hamstrings: tightness R hamstring, lacking full extension.   POSTURE: weight shift left  PALPATION: Warmth in R knee but no signs of infection, no tenderness in calf, good patellar mobility, good scar mobility.   LOWER EXTREMITY ROM:  Passive ROM Right eval Left eval Right  Right 08/21/22 Right 08/28/22 Right 09/01/22  Knee flexion 100 135 104 110 118-sitting 120  Knee extension 10 0 6 -4 6 LAQ 2-supine 4 LAQ 1 deg in supine   (Blank rows = not tested)  LOWER EXTREMITY MMT:  MMT Right eval Left eval  Hip flexion 5 5  Hip extension    Hip abduction 5 5  Hip adduction 5 5  Knee flexion    Knee extension 2 (30 deg quad lag) 5  Ankle dorsiflexion    Ankle plantarflexion     (Blank rows = not tested)  LOWER EXTREMITY SPECIAL TESTS:  NT  FUNCTIONAL TESTS:  5 times sit to stand: 13  seconds  GAIT: Distance walked: 50' Assistive device utilized: Environmental consultant - 2 wheeled Level of assistance: Modified independence Comments: decreased weight shift to RLE, lacking R knee extension in stance.     TODAY'S TREATMENT:  DATE:  09/04/22 Therapeutic Exercise: to improve strength and mobility.  Demo, verbal and tactile cues throughout for technique. Bike L2 x 6 min  Stairs x 28 - reciprocal step,no HR ascending and descending Eccentric Side steps downs 8" x 20 bil  Eccentric heel raises x 20  Hamstring curls 35# 1 x 20 bil - focus on eccentric phase, 15# RLE only 1 x 20 Knee extension 20# 1 x 20  Manual Therapy: to decrease muscle spasm and pain and improve mobility IASTM with s/s tools to vastus lateralis, peroneals, scar tissue mobilization.    09/01/22 Therapeutic Exercise: to improve strength and mobility.  Demo, verbal and tactile cues throughout for technique. Bike seat 5 L2x33mn Step ups R 8' x 10  Step ups lateral  R 8' x 10  Step downs LLE 6' x 10 UE support R SLS x 1 min Single leg squat R x 10 Knee flexion 35# 2x10 BLE; RLE 15# x 10 Knee extension 20# 2x10 BLE: R LE 5# x 10  Manual Therapy: to decrease muscle spasm, pain and improve mobility IASTM to R ITB, VL,  with rolling stick  08/28/22 Therapeutic Exercise: to improve strength and mobility.  Demo, verbal and tactile cues throughout for technique. Bike seat 5 L2x661m DGI: 23/24 Eccentric step down R 4' step x 10 Step up 8' with L toe tap going down 2x10 Lateral step up 8' L toe tap going down 2x10 Single leg RDL 2x10 Supine quad stretch x 30 sec w/ strap Supine ITB stretch with strap x 30 sec Manual Therapy: to decrease muscle spasm, pain and improve mobility.  IASTM to R ITB, VL, lateral hamstrings    PATIENT EDUCATION:  Education details: HEP update Person educated:  Patient Education method: Explanation, Demonstration, Verbal cues, and Handouts Education comprehension: verbalized understanding and returned demonstration  HOME EXERCISE PROGRAM: Access Code: [email or web address]: [email or web address] Date: 08/18/2022 Prepared by: ElGlenetta HewExercises - Seated Quad Set  - 2 x daily - 7 x weekly - 1 sets - 10 reps - 5 sec  hold - Standing Quad Set  - 2 x daily - 7 x weekly - 1 sets - 10 reps - 5 sec  hold - [place]  - 2 x daily - 7 x weekly - 2 sets - 10 reps - Heel Raises with Counter Support  - 1 x daily - 7 x weekly - 2 sets - 10 reps - Standing Hip Abduction with Counter Support  - 1 x daily - 7 x weekly - 2 sets - 10 reps - Standing Hip Extension with Counter Support  - 1 x daily - 7 x weekly - 2 sets - 10 reps - Mini Squat with Counter Support  - 1 x daily - 7 x weekly - 2 sets - 10 reps - Standing Knee Flexion  - 1 x daily - 7 x weekly - 2 sets - 10 reps  ASSESSMENT:  CLINICAL IMPRESSION: Mr. WiHagermans making good progress towards goals, able to ascend/descend stairs without HR and reciprocal step today meeting LTG #5, although landing slightly heavier on L foot due to decreased eccentric control on R.  Continued to focus on eccentric strengthening today, tolerated well, followed by manual therapy to decrease tightness and reduce complaint of soreness in RLE.  Declined modalities following.  HeIhor Dowontinues to demonstrate potential for improvement and would benefit from continued skilled therapy to address impairments.      OBJECTIVE IMPAIRMENTS: Abnormal gait, decreased activity  tolerance, decreased endurance, decreased mobility, difficulty walking, decreased ROM, decreased strength, increased edema, increased muscle spasms, impaired flexibility, impaired sensation, and pain.   ACTIVITY LIMITATIONS: lifting, bending, sitting, standing, squatting, stairs, transfers, and locomotion level  PARTICIPATION LIMITATIONS: meal  prep, cleaning, laundry, driving, shopping, community activity, and yard work  PERSONAL FACTORS: 1-2 comorbidities: osteoporosis, history back pain  are also affecting patient's functional outcome.   REHAB POTENTIAL: Excellent  CLINICAL DECISION MAKING: Stable/uncomplicated  EVALUATION COMPLEXITY: Low   GOALS: Goals reviewed with patient? Yes   SHORT TERM GOALS: Target date: 08/26/2022    Independent with initial HEP. Baseline: given Goal status: MET 08/18/22- good compliance.    LONG TERM GOALS: Target date: 09/23/2022   Independent with advanced/ongoing HEP to improve outcomes and carryover.  Baseline:  Goal status: IN PROGRESS 09/04/22- met for current.   2.  Ihor Dow will demonstrate R knee flexion to 120 deg to ascend/descend stairs. Baseline: 100 Goal status: MET 09/01/22 - 120 deg  3.  Ihor Dow will demonstrate full R knee extension for safety with gait. Baseline: lacking 10 Goal status: IN PROGRESS 08/18/22- lacking 6 09/01/22- lacking 1 degree   4.  Carsin Giannini will be able to ambulate 600' safely with LRAD and normal gait pattern to access community.  Baseline: 2WRW needed Goal status: IN PROGRESS  08/18/22- 90' without AD  5.  Ihor Dow will be able to ascend/descend stairs with 1 HR and reciprocal step pattern safely to access home and community.  Baseline: HR or walker needed Goal status: MET 09/04/22  6.  Sebatian Grambo will demonstrate > 19/24 on DGI to demonstrate decreased risk of falls.   Baseline: NT Goal status: MET- 08/28/22 23/24  7.  Ihor Dow will demonstrate improved functional LE strength by demonstrating 5/5 R quad strength Baseline: 2/5 with quad lag Goal status: IN PROGRESS  8.  Ihor Dow will demonstrate >40/80 on LEFS to demonstrate improved mobility.  Baseline: 23/80 Goal status: IN PROGRESS  PLAN:  PT FREQUENCY: 2x/week  PT DURATION: 6 weeks  PLANNED INTERVENTIONS: Therapeutic exercises, Therapeutic activity,  Neuromuscular re-education, Balance training, Gait training, Patient/Family education, Self Care, Joint mobilization, Stair training, Dry Needling, Electrical stimulation, Cryotherapy, Moist heat, Taping, Vasopneumatic device, Ultrasound, Manual therapy, and Re-evaluation  PLAN FOR NEXT SESSION: eccentric quad strength; balance- SLS activities; weight machines    Rennie Natter, PT, DPT  09/04/2022, 12:01 PM

## 2022-09-05 ENCOUNTER — Telehealth: Payer: Self-pay | Admitting: *Deleted

## 2022-09-05 NOTE — Telephone Encounter (Signed)
Ortho bundle 30 day call completed. °

## 2022-09-06 ENCOUNTER — Encounter: Payer: Self-pay | Admitting: Orthopedic Surgery

## 2022-09-06 NOTE — Progress Notes (Signed)
Post-Op Visit Note   Patient: [name]           Date of Birth: 1946/06/10           MRN: [id number] Visit Date: 09/03/2022 PCP: Mosie Lukes, MD   Assessment & Plan:  Chief Complaint:  Chief Complaint  Patient presents with   Right Knee - Follow-up    right total knee arthroplasty on 07/24/2022   Visit Diagnoses:  1. S/P total knee arthroplasty, right     Plan: [name] is a patient who is now about 6 weeks out right total knee replacement.  Doing therapy 2 times a week.  On examination he has range of motion of 8-1 20.  Gabapentin refilled.  Negative Homans no calf tenderness today.  Occasionally gets pain that wakes him at night along with some tibial soreness.  Wants to get back to golf and yard work in mid to late April.  6-week return for clinical recheck.  Follow-Up Instructions: No follow-ups on file.   Orders:  No orders of the defined types were placed in this encounter.  No orders of the defined types were placed in this encounter.   Imaging: No results found.  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   Skin tags, multiple acquired 07/07/2019   LUQ pain 07/07/2019   OA (osteoarthritis) of knee 03/16/2019   Ventral hernia s/p lap repair with mesh 06/03/2018 06/03/2018   Hemorrhoids 08/22/2016   Anemia 03/16/2016   Right shoulder pain 02/26/2016   Abnormal CT scan, gastrointestinal tract suspect angioedema    Abdominal pain    Hyperglycemia 08/21/2013   Low back pain 10/10/2012   Hyperlipidemia    Hypertension, essential     Obesity (BMI 30.0-34.9)    Past Medical History:  Diagnosis Date   Abnormal CT scan, gastrointestinal tract suspect angioedema    Anemia 03/16/2016   IRON   Anxiety    Arthritis    Back pain 10/10/2012   Fatty liver 2005   seen on 2005  ultrasound,05/2015 CT.    Hemorrhoids 08/22/2016   Hemorrhoids, internal, with bleeding 2005   internal and external. 2005 band ligation (dr Ferdinand Lango in [facility])   Hyperglycemia 08/21/2013   Hyperlipidemia    Hypertension    Nocturia 08/21/2013   Obesity (BMI 30.0-34.9)    Osteoporosis    Pneumonia    as a child   Rectal bleeding 07/30/2014   Renal cyst    SBO (small bowel obstruction) ([place])    Tubular adenoma of colon before 2005, 2012, 08/2014   Unspecified constipation 02/06/2014   Vitamin D deficiency     Family History  Problem Relation Age of Onset   Stroke Mother    Aneurysm Mother        brain   Arthritis Father    Liver cancer Father        liver, atomic testing in [place]   Gallbladder disease Father    Hyperlipidemia Sister    Hypertension Sister    Obesity Sister    Diabetes Sister    Diabetes Brother    Hyperlipidemia Sister    Hypertension Sister    Obesity Sister    Diabetes Sister    Aneurysm Brother        brain   COPD Brother  Gallbladder disease Sister        x4   Colon cancer Neg Hx    Colon polyps Neg Hx    Esophageal cancer Neg Hx    Rectal cancer Neg Hx    Stomach cancer Neg Hx    Prostate cancer Neg Hx    CAD Neg Hx     Past Surgical History:  Procedure Laterality Date   COLONOSCOPY  before 2005, 2012, 2013, 08/2014.    ENTEROSCOPY N/A 10/05/2017   Procedure: ENTEROSCOPY;  Surgeon: Gatha Mayer, MD;  [facility];  Service: Endoscopy;  Laterality: N/A;   FRACTURE SURGERY Left 1989   leg   HEMORRHOID BANDING  2005   IR RADIOLOGIST EVAL & MGMT  11/11/2021   KNEE SURGERY Left 2010   arthroscopy, torn carilage   KNEE SURGERY Right 2012   arthroscopy   TONSILLECTOMY  1957   TONSILLECTOMY     TOTAL KNEE ARTHROPLASTY Right 07/24/2022   Procedure: RIGHT TOTAL KNEE ARTHROPLASTY;  Surgeon: Meredith Pel, MD;  Location: [place];  Service: Orthopedics;  Laterality: Right;   TOTAL SHOULDER ARTHROPLASTY Left 11/01/2020   Procedure:  LEFT ANATOMIC SHOULDER REPLACEMENT;  Surgeon: Meredith Pel, MD;  Location: [place];  Service: Orthopedics;  Laterality: Left;   VENTRAL HERNIA REPAIR N/A 06/03/2018   Procedure: LAPAROSCOPIC VENTRAL WALL  HERNIA  REPAIR WITH MESH  ERAS PATHWAY;  Surgeon: Michael Boston, MD;  [facility];  Service: General;  Laterality: N/A;   Social History   Occupational History   Occupation: Retired    Fish farm manager: [employer]  Tobacco Use   Smoking status: Never   Smokeless tobacco: Never  Vaping Use   Vaping Use: Never used  Substance and Sexual Activity   Alcohol use: Not Currently    Comment: Occassionally   Drug use: No   Sexual activity: Yes

## 2022-09-08 ENCOUNTER — Encounter: Payer: Self-pay | Admitting: Physical Therapy

## 2022-09-08 ENCOUNTER — Ambulatory Visit: Payer: Medicare Other | Admitting: Physical Therapy

## 2022-09-08 DIAGNOSIS — R262 Difficulty in walking, not elsewhere classified: Secondary | ICD-10-CM

## 2022-09-08 DIAGNOSIS — M6281 Muscle weakness (generalized): Secondary | ICD-10-CM | POA: Diagnosis not present

## 2022-09-08 DIAGNOSIS — R600 Localized edema: Secondary | ICD-10-CM | POA: Diagnosis not present

## 2022-09-08 DIAGNOSIS — M25561 Pain in right knee: Secondary | ICD-10-CM | POA: Diagnosis not present

## 2022-09-08 DIAGNOSIS — M25661 Stiffness of right knee, not elsewhere classified: Secondary | ICD-10-CM | POA: Diagnosis not present

## 2022-09-08 NOTE — Therapy (Signed)
OUTPATIENT PHYSICAL THERAPY TREATMENT   Patient Name: [name] MRN: [id number] DOB:16-Apr-1946, 77 y.o., male Today's Date: 09/08/2022  END OF SESSION:  PT End of Session - 09/08/22 1020     Visit Number 9    Number of Visits 12    Date for PT Re-Evaluation 09/23/22    Authorization Type Medicare + [employer]    PT Start Time 1017    PT Stop Time 1059    PT Time Calculation (min) 42 min    Activity Tolerance Patient tolerated treatment well    Behavior During Therapy [facility] for tasks assessed/performed                  Past Medical History:  Diagnosis Date   Abnormal CT scan, gastrointestinal tract suspect angioedema    Anemia 03/16/2016   IRON   Anxiety    Arthritis    Back pain 10/10/2012   Fatty liver 2005   seen on 2005 ultrasound,05/2015 CT.    Hemorrhoids 08/22/2016   Hemorrhoids, internal, with bleeding 2005   internal and external. 2005 band ligation (dr Ferdinand Lango in [facility])   Hyperglycemia 08/21/2013   Hyperlipidemia    Hypertension    Nocturia 08/21/2013   Obesity (BMI 30.0-34.9)    Osteoporosis    Pneumonia    as a child   Rectal bleeding 07/30/2014   Renal cyst    SBO (small bowel obstruction) ([place])    Tubular adenoma of colon before 2005, 2012, 08/2014   Unspecified constipation 02/06/2014   Vitamin D deficiency    Past Surgical History:  Procedure Laterality Date   COLONOSCOPY  before 2005, 2012, 2013, 08/2014.    ENTEROSCOPY N/A 10/05/2017   Procedure: ENTEROSCOPY;  Surgeon: Gatha Mayer, MD;  Location: [facility];  Service: Endoscopy;  Laterality: N/A;   FRACTURE SURGERY Left 1989   leg   HEMORRHOID BANDING  2005   IR RADIOLOGIST EVAL & MGMT  11/11/2021   KNEE SURGERY Left 2010   arthroscopy, torn carilage   KNEE SURGERY Right 2012   arthroscopy   TONSILLECTOMY  1957   TONSILLECTOMY     TOTAL KNEE ARTHROPLASTY Right 07/24/2022   Procedure: RIGHT TOTAL KNEE ARTHROPLASTY;  Surgeon: Meredith Pel, MD;  Location: [place];   Service: Orthopedics;  Laterality: Right;   TOTAL SHOULDER ARTHROPLASTY Left 11/01/2020   Procedure: LEFT ANATOMIC SHOULDER REPLACEMENT;  Surgeon: Meredith Pel, MD;  Location: [place];  Service: Orthopedics;  Laterality: Left;   VENTRAL HERNIA REPAIR N/A 06/03/2018   Procedure: LAPAROSCOPIC VENTRAL WALL  HERNIA  REPAIR WITH MESH  ERAS PATHWAY;  Surgeon: Michael Boston, MD;  Location: [facility];  Service: General;  Laterality: N/A;   Patient Active Problem List   Diagnosis Date Noted   Arthritis of right knee 07/27/2022   S/P total knee arthroplasty, right 07/24/2022   Shoulder arthritis    S/P shoulder replacement, 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   Skin tags, multiple acquired 07/07/2019   LUQ pain 07/07/2019   OA (osteoarthritis) of knee 03/16/2019   Ventral hernia s/p lap repair with mesh 06/03/2018 06/03/2018   Hemorrhoids 08/22/2016   Anemia 03/16/2016   Right shoulder pain 02/26/2016   Abnormal CT scan, gastrointestinal tract suspect angioedema    Abdominal pain    Hyperglycemia 08/21/2013   Low back pain 10/10/2012   Hyperlipidemia    Hypertension, essential     Obesity (BMI 30.0-34.9)     PCP: Mosie Lukes, MD   REFERRING PROVIDER:  Meredith Pel, MD   REFERRING DIAG: s/p R TKA   THERAPY DIAG:  Stiffness of right knee, not elsewhere classified  Localized edema  Difficulty in walking, not elsewhere classified  Muscle weakness (generalized)  Rationale for Evaluation and Treatment: Rehabilitation  ONSET DATE: 07/24/2022  SUBJECTIVE:   SUBJECTIVE STATEMENT: Doing well, but still aches at night.    NEXT MD VISIT: returns in 6 weeks   PERTINENT HISTORY: History back pain, L TSA, Ventral hernia repair PAIN:  Are you having pain? Yes: NPRS scale: 4.5/10 Pain location: R medial knee Pain description: ache Aggravating factors: laying in bed Relieving factors: cold  PRECAUTIONS: None  WEIGHT BEARING RESTRICTIONS: No  FALLS:  Has patient fallen in last 6 months?  No  LIVING ENVIRONMENT: Lives with: lives with their spouse Lives in: House/apartment Stairs: Yes: Internal: 12 steps; on right going up and External: 2 steps; none Has following equipment at home: Walker - 2 wheeled  OCCUPATION: retired   PLOF: Independent and Leisure: golf, yard work, gym- bike  PATIENT GOALS: get back to normal life- walking without pain, playing golf, get back to gym, be active, sit down and not hurt.    OBJECTIVE:   DIAGNOSTIC FINDINGS: 08/08/22 AP, lateral views of right knee reviewed. Right total knee prosthesis in excellent position and alignment without any complicating features. No fracture. No dislocation.   PATIENT SURVEYS:  LEFS 28.75%  COGNITION: Overall cognitive status: Within functional limits for tasks assessed     SENSATION: Occasional numbness/tingling to R foot, but intact to light touch  EDEMA:  Circumferential: R 44.5 cm, L 41.5 cm  MUSCLE LENGTH: Hamstrings: tightness R hamstring, lacking full extension.   POSTURE: weight shift left  PALPATION: Warmth in R knee but no signs of infection, no tenderness in calf, good patellar mobility, good scar mobility.   LOWER EXTREMITY ROM:  Passive ROM Right eval Left eval Right  Right 08/21/22 Right 08/28/22 Right 09/01/22  Knee flexion 100 135 104 110 118-sitting 120  Knee extension 10 0 6 -4 6 LAQ 2-supine 4 LAQ 1 deg in supine   (Blank rows = not tested)  LOWER EXTREMITY MMT:  MMT Right eval Left eval  Hip flexion 5 5  Hip extension    Hip abduction 5 5  Hip adduction 5 5  Knee flexion    Knee extension 2 (30 deg quad lag) 5  Ankle dorsiflexion    Ankle plantarflexion     (Blank rows = not tested)  LOWER EXTREMITY SPECIAL TESTS:  NT  FUNCTIONAL TESTS:  5 times sit to stand: 13 seconds  GAIT: Distance walked: 50' Assistive device utilized: Environmental consultant - 2 wheeled Level of assistance: Modified independence Comments: decreased weight shift to RLE, lacking R knee extension  in stance.     TODAY'S TREATMENT:  DATE:  09/08/2022 Therapeutic Exercise: to improve strength and mobility.  Demo, verbal and tactile cues throughout for technique. Bike L2 x 6 min Hamstring curls 35# x 20, RLE only 20# 2 x 8 Knee extension 25# x 20, RLE 10# x 20  Eccentric heel raises x 15 Neuromuscular Reeducation: to improve balance and stability. SBA for safety throughout.  Star excursion pattern x 5 each side Tandem gait 2 x 10 steps - cues for posture Retro tandem  4 x 10 steps - cues for posture On airex: Eyes open feet together x 30 sec  Eyes open feet together with head nods x 10 Eyes open feet together with head turns x 10 Eyes closed feet together x 30 sec Eyes closed feet together with head nods x 10 Eyes closed feet together with head turns x 10  Tandem standing x 30 sec bil     09/04/22 Therapeutic Exercise: to improve strength and mobility.  Demo, verbal and tactile cues throughout for technique. Bike L2 x 6 min  Stairs x 28 - reciprocal step,no HR ascending and descending Eccentric Side steps downs 8" x 20 bil  Eccentric heel raises x 20  Hamstring curls 35# 1 x 20 bil - focus on eccentric phase, 15# RLE only 1 x 20 Knee extension 20# 1 x 20  Manual Therapy: to decrease muscle spasm and pain and improve mobility IASTM with s/s tools to vastus lateralis, peroneals, scar tissue mobilization.    09/01/22 Therapeutic Exercise: to improve strength and mobility.  Demo, verbal and tactile cues throughout for technique. Bike seat 5 L2x69mn Step ups R 8' x 10  Step ups lateral  R 8' x 10  Step downs LLE 6' x 10 UE support R SLS x 1 min Single leg squat R x 10 Knee flexion 35# 2x10 BLE; RLE 15# x 10 Knee extension 20# 2x10 BLE: R LE 5# x 10  Manual Therapy: to decrease muscle spasm, pain and improve mobility IASTM to R ITB, VL,  with rolling  stick  08/28/22 Therapeutic Exercise: to improve strength and mobility.  Demo, verbal and tactile cues throughout for technique. Bike seat 5 L2x658m DGI: 23/24 Eccentric step down R 4' step x 10 Step up 8' with L toe tap going down 2x10 Lateral step up 8' L toe tap going down 2x10 Single leg RDL 2x10 Supine quad stretch x 30 sec w/ strap Supine ITB stretch with strap x 30 sec Manual Therapy: to decrease muscle spasm, pain and improve mobility.  IASTM to R ITB, VL, lateral hamstrings    PATIENT EDUCATION:  Education details: HEP update Person educated: Patient Education method: Explanation, Demonstration, Verbal cues, and Handouts Education comprehension: verbalized understanding and returned demonstration  HOME EXERCISE PROGRAM: Access Code: [email or web address]: [email or web address] Date: 08/18/2022 Prepared by: ElGlenetta HewExercises - Seated Quad Set  - 2 x daily - 7 x weekly - 1 sets - 10 reps - 5 sec  hold - Standing Quad Set  - 2 x daily - 7 x weekly - 1 sets - 10 reps - 5 sec  hold - [place]  - 2 x daily - 7 x weekly - 2 sets - 10 reps - Heel Raises with Counter Support  - 1 x daily - 7 x weekly - 2 sets - 10 reps - Standing Hip Abduction with Counter Support  - 1 x daily - 7 x weekly - 2 sets - 10 reps - Standing Hip Extension with Counter Support  -  1 x daily - 7 x weekly - 2 sets - 10 reps - Mini Squat with Counter Support  - 1 x daily - 7 x weekly - 2 sets - 10 reps - Standing Knee Flexion  - 1 x daily - 7 x weekly - 2 sets - 10 reps  ASSESSMENT:  CLINICAL IMPRESSION: Mr. Mitzner continues to make good progress.  Today started focusing more on balance, with tandem, retro tandem walking, single leg stance activities, and standing on unstable surfaces.  He was initiallly challenged with crossover stepping on start excursion when in R stance, but improved with practice.  Most challenged with tandem stance on airex today.   Ihor Dow continues to  demonstrate potential for improvement and would benefit from continued skilled therapy to address impairments.      OBJECTIVE IMPAIRMENTS: Abnormal gait, decreased activity tolerance, decreased endurance, decreased mobility, difficulty walking, decreased ROM, decreased strength, increased edema, increased muscle spasms, impaired flexibility, impaired sensation, and pain.   ACTIVITY LIMITATIONS: lifting, bending, sitting, standing, squatting, stairs, transfers, and locomotion level  PARTICIPATION LIMITATIONS: meal prep, cleaning, laundry, driving, shopping, community activity, and yard work  PERSONAL FACTORS: 1-2 comorbidities: osteoporosis, history back pain  are also affecting patient's functional outcome.   REHAB POTENTIAL: Excellent  CLINICAL DECISION MAKING: Stable/uncomplicated  EVALUATION COMPLEXITY: Low   GOALS: Goals reviewed with patient? Yes   SHORT TERM GOALS: Target date: 08/26/2022    Independent with initial HEP. Baseline: given Goal status: MET 08/18/22- good compliance.    LONG TERM GOALS: Target date: 09/23/2022   Independent with advanced/ongoing HEP to improve outcomes and carryover.  Baseline:  Goal status: IN PROGRESS 09/04/22- met for current.   2.  Ihor Dow will demonstrate R knee flexion to 120 deg to ascend/descend stairs. Baseline: 100 Goal status: MET 09/01/22 - 120 deg  3.  Ihor Dow will demonstrate full R knee extension for safety with gait. Baseline: lacking 10 Goal status: IN PROGRESS 08/18/22- lacking 6 09/01/22- lacking 1 degree   4.  Rojelio Vultaggio will be able to ambulate 600' safely with LRAD and normal gait pattern to access community.  Baseline: 2WRW needed Goal status: IN PROGRESS  08/18/22- 90' without AD  5.  Ihor Dow will be able to ascend/descend stairs with 1 HR and reciprocal step pattern safely to access home and community.  Baseline: HR or walker needed Goal status: MET 09/04/22  6.  Naetochukwu Mastandrea will demonstrate >  19/24 on DGI to demonstrate decreased risk of falls.   Baseline: NT Goal status: MET- 08/28/22 23/24  7.  Ihor Dow will demonstrate improved functional LE strength by demonstrating 5/5 R quad strength Baseline: 2/5 with quad lag Goal status: IN PROGRESS  8.  Ihor Dow will demonstrate >40/80 on LEFS to demonstrate improved mobility.  Baseline: 23/80 Goal status: IN PROGRESS  PLAN:  PT FREQUENCY: 2x/week  PT DURATION: 6 weeks  PLANNED INTERVENTIONS: Therapeutic exercises, Therapeutic activity, Neuromuscular re-education, Balance training, Gait training, Patient/Family education, Self Care, Joint mobilization, Stair training, Dry Needling, Electrical stimulation, Cryotherapy, Moist heat, Taping, Vasopneumatic device, Ultrasound, Manual therapy, and Re-evaluation  PLAN FOR NEXT SESSION: continued to focus on higher level balance activities.    Rennie Natter, PT, DPT  09/08/2022, 12:05 PM

## 2022-09-11 ENCOUNTER — Ambulatory Visit: Payer: Medicare Other

## 2022-09-11 DIAGNOSIS — R262 Difficulty in walking, not elsewhere classified: Secondary | ICD-10-CM

## 2022-09-11 DIAGNOSIS — M25661 Stiffness of right knee, not elsewhere classified: Secondary | ICD-10-CM | POA: Diagnosis not present

## 2022-09-11 DIAGNOSIS — M6281 Muscle weakness (generalized): Secondary | ICD-10-CM

## 2022-09-11 DIAGNOSIS — R600 Localized edema: Secondary | ICD-10-CM | POA: Diagnosis not present

## 2022-09-11 DIAGNOSIS — M25561 Pain in right knee: Secondary | ICD-10-CM | POA: Diagnosis not present

## 2022-09-11 NOTE — Therapy (Signed)
OUTPATIENT PHYSICAL THERAPY TREATMENT Progress Note Reporting Period 08/12/22 to 09/11/22  See note below for Objective Data and Assessment of Progress/Goals.      Patient Name: [name] MRN: [id number] DOB:08-03-1945, 77 y.o., male Today's Date: 09/11/2022  END OF SESSION:  PT End of Session - 09/11/22 1017     Visit Number 10    Number of Visits 12    Date for PT Re-Evaluation 09/23/22    Authorization Type Medicare + [employer]    PT Start Time 1013    PT Stop Time 1108    PT Time Calculation (min) 55 min    Activity Tolerance Patient tolerated treatment well    Behavior During Therapy [facility] [place] for tasks assessed/performed                   Past Medical History:  Diagnosis Date   Abnormal CT scan, gastrointestinal tract suspect angioedema    Anemia 03/16/2016   IRON   Anxiety    Arthritis    Back pain 10/10/2012   Fatty liver 2005   seen on 2005 ultrasound,05/2015 CT.    Hemorrhoids 08/22/2016   Hemorrhoids, internal, with bleeding 2005   internal and external. 2005 band ligation (dr Ferdinand Lango in [facility])   Hyperglycemia 08/21/2013   Hyperlipidemia    Hypertension    Nocturia 08/21/2013   Obesity (BMI 30.0-34.9)    Osteoporosis    Pneumonia    as a child   Rectal bleeding 07/30/2014   Renal cyst    SBO (small bowel obstruction) ([place])    Tubular adenoma of colon before 2005, 2012, 08/2014   Unspecified constipation 02/06/2014   Vitamin D deficiency    Past Surgical History:  Procedure Laterality Date   COLONOSCOPY  before 2005, 2012, 2013, 08/2014.    ENTEROSCOPY N/A 10/05/2017   Procedure: ENTEROSCOPY;  Surgeon: Gatha Mayer, MD;  Location: [facility];  Service: Endoscopy;  Laterality: N/A;   FRACTURE SURGERY Left 1989   leg   HEMORRHOID BANDING  2005   IR RADIOLOGIST EVAL & MGMT  11/11/2021   KNEE SURGERY Left 2010   arthroscopy, torn carilage   KNEE SURGERY Right 2012   arthroscopy   TONSILLECTOMY  1957   TONSILLECTOMY     TOTAL KNEE  ARTHROPLASTY Right 07/24/2022   Procedure: RIGHT TOTAL KNEE ARTHROPLASTY;  Surgeon: Meredith Pel, MD;  Location: [facility];  Service: Orthopedics;  Laterality: Right;   TOTAL SHOULDER ARTHROPLASTY Left 11/01/2020   Procedure: LEFT ANATOMIC SHOULDER REPLACEMENT;  Surgeon: Meredith Pel, MD;  Location: [facility];  Service: Orthopedics;  Laterality: Left;   VENTRAL HERNIA REPAIR N/A 06/03/2018   Procedure: LAPAROSCOPIC VENTRAL WALL  HERNIA  REPAIR WITH MESH  ERAS PATHWAY;  Surgeon: Michael Boston, MD;  Location: [facility];  Service: General;  Laterality: N/A;   Patient Active Problem List   Diagnosis Date Noted   Arthritis of right knee 07/27/2022   S/P total knee arthroplasty, right 07/24/2022   Shoulder arthritis    S/P shoulder replacement, 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   Skin tags, multiple acquired 07/07/2019   LUQ pain 07/07/2019   OA (osteoarthritis) of knee 03/16/2019   Ventral hernia s/p lap repair with mesh 06/03/2018 06/03/2018   Hemorrhoids 08/22/2016   Anemia 03/16/2016   Right shoulder pain 02/26/2016   Abnormal CT scan, gastrointestinal tract suspect angioedema    Abdominal pain    Hyperglycemia 08/21/2013   Low back pain 10/10/2012   Hyperlipidemia  Hypertension, essential     Obesity (BMI 30.0-34.9)     PCP: Mosie Lukes, MD   REFERRING PROVIDER: Meredith Pel, MD   REFERRING DIAG: s/p R TKA   THERAPY DIAG:  Stiffness of right knee, not elsewhere classified  Localized edema  Difficulty in walking, not elsewhere classified  Muscle weakness (generalized)  Rationale for Evaluation and Treatment: Rehabilitation  ONSET DATE: 07/24/2022  SUBJECTIVE:   SUBJECTIVE STATEMENT:  The knee just feels stiff.  NEXT MD VISIT: returns in 6 weeks   PERTINENT HISTORY: History back pain, L TSA, Ventral hernia repair PAIN:  Are you having pain? Yes: NPRS scale: 4.5/10 Pain location: R medial knee Pain description: ache Aggravating factors: laying in bed Relieving  factors: cold  PRECAUTIONS: None  WEIGHT BEARING RESTRICTIONS: No  FALLS:  Has patient fallen in last 6 months? No  LIVING ENVIRONMENT: Lives with: lives with their spouse Lives in: House/apartment Stairs: Yes: Internal: 12 steps; on right going up and External: 2 steps; none Has following equipment at home: Walker - 2 wheeled  OCCUPATION: retired   PLOF: Independent and Leisure: golf, yard work, gym- bike  PATIENT GOALS: get back to normal life- walking without pain, playing golf, get back to gym, be active, sit down and not hurt.    OBJECTIVE:   DIAGNOSTIC FINDINGS: 08/08/22 AP, lateral views of right knee reviewed. Right total knee prosthesis in excellent position and alignment without any complicating features. No fracture. No dislocation.   PATIENT SURVEYS:  LEFS 28.75%  COGNITION: Overall cognitive status: Within functional limits for tasks assessed     SENSATION: Occasional numbness/tingling to R foot, but intact to light touch  EDEMA:  Circumferential: R 44.5 cm, L 41.5 cm  MUSCLE LENGTH: Hamstrings: tightness R hamstring, lacking full extension.   POSTURE: weight shift left  PALPATION: Warmth in R knee but no signs of infection, no tenderness in calf, good patellar mobility, good scar mobility.   LOWER EXTREMITY ROM:  Passive ROM Right eval Left eval Right  Right 08/21/22 Right 08/28/22 Right 09/01/22 Right 09/11/22  Knee flexion 100 135 104 110 118-sitting 120 124  Knee extension 10 0 6 -4 6 LAQ 2-supine 4 LAQ 1 deg in supine 0 supine 2 LAQ   (Blank rows = not tested)  LOWER EXTREMITY MMT:  MMT Right eval Left eval  Hip flexion 5 5  Hip extension    Hip abduction 5 5  Hip adduction 5 5  Knee flexion    Knee extension 2 (30 deg quad lag) 5  Ankle dorsiflexion    Ankle plantarflexion     (Blank rows = not tested)  LOWER EXTREMITY SPECIAL TESTS:  NT  FUNCTIONAL TESTS:  5 times sit to stand: 13 seconds  GAIT: Distance walked:  50' Assistive device utilized: Environmental consultant - 2 wheeled Level of assistance: Modified independence Comments: decreased weight shift to RLE, lacking R knee extension in stance.     TODAY'S TREATMENT:  DATE:  09/11/22 Therapeutic Exercise: to improve strength and mobility.  Demo, verbal and tactile cues throughout for technique. Bike L2 x 8 min Knee flexion 35# 2x10 Knee extension 20# x 20 bil Neuromuscular Reeducation: to improve balance and stability. Clock balance 3x all the way around  R SLS on airex x 30 sec on airex Tandem stance on airex 2x30 sec Tandem gait 2x23f  Single R leg RDL x 10 R SLS with ball toss 2x10 Vaso x 10 min R knee low compression 34 deg   09/08/2022 Therapeutic Exercise: to improve strength and mobility.  Demo, verbal and tactile cues throughout for technique. Bike L2 x 6 min Hamstring curls 35# x 20, RLE only 20# 2 x 8 Knee extension 25# x 20, RLE 10# x 20  Eccentric heel raises x 15 Neuromuscular Reeducation: to improve balance and stability. SBA for safety throughout.  Star excursion pattern x 5 each side Tandem gait 2 x 10 steps - cues for posture Retro tandem  4 x 10 steps - cues for posture On airex: Eyes open feet together x 30 sec  Eyes open feet together with head nods x 10 Eyes open feet together with head turns x 10 Eyes closed feet together x 30 sec Eyes closed feet together with head nods x 10 Eyes closed feet together with head turns x 10  Tandem standing x 30 sec bil     09/04/22 Therapeutic Exercise: to improve strength and mobility.  Demo, verbal and tactile cues throughout for technique. Bike L2 x 6 min  Stairs x 28 - reciprocal step,no HR ascending and descending Eccentric Side steps downs 8" x 20 bil  Eccentric heel raises x 20  Hamstring curls 35# 1 x 20 bil - focus on eccentric phase, 15# RLE only 1 x 20 Knee  extension 20# 1 x 20  Manual Therapy: to decrease muscle spasm and pain and improve mobility IASTM with s/s tools to vastus lateralis, peroneals, scar tissue mobilization.    09/01/22 Therapeutic Exercise: to improve strength and mobility.  Demo, verbal and tactile cues throughout for technique. Bike seat 5 [id number] Step ups R 8' x 10  Step ups lateral  R 8' x 10  Step downs LLE 6' x 10 UE support R SLS x 1 min Single leg squat R x 10 Knee flexion 35# 2x10 BLE; RLE 15# x 10 Knee extension 20# 2x10 BLE: R LE 5# x 10  Manual Therapy: to decrease muscle spasm, pain and improve mobility IASTM to R ITB, VL,  with rolling stick  08/28/22 Therapeutic Exercise: to improve strength and mobility.  Demo, verbal and tactile cues throughout for technique. Bike seat 5 [id number]: 23/24 Eccentric step down R 4' step x 10 Step up 8' with L toe tap going down 2x10 Lateral step up 8' L toe tap going down 2x10 Single leg RDL 2x10 Supine quad stretch x 30 sec w/ strap Supine ITB stretch with strap x 30 sec Manual Therapy: to decrease muscle spasm, pain and improve mobility.  IASTM to R ITB, VL, lateral hamstrings    PATIENT EDUCATION:  Education details: HEP update Person educated: Patient Education method: Explanation, Demonstration, Verbal cues, and Handouts Education comprehension: verbalized understanding and returned demonstration  HOME EXERCISE PROGRAM: Access Code: [email or web address]: [email or web address] Date: 08/18/2022 Prepared by: EliGlenetta Hewxercises - Seated Quad Set  - 2 x daily - 7 x weekly - 1 sets - 10 reps - 5 sec  hold - Standing Quad  Set  - 2 x daily - 7 x weekly - 1 sets - 10 reps - 5 sec  hold - [place]  - 2 x daily - 7 x weekly - 2 sets - 10 reps - Heel Raises with Counter Support  - 1 x daily - 7 x weekly - 2 sets - 10 reps - Standing Hip Abduction with Counter Support  - 1 x daily - 7 x weekly - 2 sets - 10 reps - Standing Hip Extension with  Counter Support  - 1 x daily - 7 x weekly - 2 sets - 10 reps - Mini Squat with Counter Support  - 1 x daily - 7 x weekly - 2 sets - 10 reps - Standing Knee Flexion  - 1 x daily - 7 x weekly - 2 sets - 10 reps  ASSESSMENT:  CLINICAL IMPRESSION: Pt demonstrates good quad strength with MMT, SLR (no quad lag),and eccentric control on stairs. R knee AROM is 2-124 in seated position, he has full knee extension in supine. Pt shows difficulty with balance exercises requiring SLS and narrow BOS. He showed good response to treatment and is progressing very well. He is pretty much  back to his desired functional level. He has met LTGs #4 and 7 today. He has two more visits of PT left and would most likely be able to wrap up PT within these visits.    OBJECTIVE IMPAIRMENTS: Abnormal gait, decreased activity tolerance, decreased endurance, decreased mobility, difficulty walking, decreased ROM, decreased strength, increased edema, increased muscle spasms, impaired flexibility, impaired sensation, and pain.   ACTIVITY LIMITATIONS: lifting, bending, sitting, standing, squatting, stairs, transfers, and locomotion level  PARTICIPATION LIMITATIONS: meal prep, cleaning, laundry, driving, shopping, community activity, and yard work  PERSONAL FACTORS: 1-2 comorbidities: osteoporosis, history back pain  are also affecting patient's functional outcome.   REHAB POTENTIAL: Excellent  CLINICAL DECISION MAKING: Stable/uncomplicated  EVALUATION COMPLEXITY: Low   GOALS: Goals reviewed with patient? Yes   SHORT TERM GOALS: Target date: 08/26/2022    Independent with initial HEP. Baseline: given Goal status: MET 08/18/22- good compliance.    LONG TERM GOALS: Target date: 09/23/2022   Independent with advanced/ongoing HEP to improve outcomes and carryover.  Baseline:  Goal status: IN PROGRESS 09/04/22- met for current.   2.  [name] will demonstrate R knee flexion to 120 deg to ascend/descend  stairs. Baseline: 100 Goal status: MET 09/01/22 - 120 deg  3.  [name] will demonstrate full R knee extension for safety with gait. Baseline: lacking 10 Goal status: MET 09/11/22 - 0 deg in supine  4.  [name] will be able to ambulate 600' safely with LRAD and normal gait pattern to access community.  Baseline: 2WRW needed Goal status: IN PROGRESS  08/18/22- 90' without AD  5.  [name] will be able to ascend/descend stairs with 1 HR and reciprocal step pattern safely to access home and community.  Baseline: HR or walker needed Goal status: MET 09/04/22  6.  [name] will demonstrate > 19/24 on DGI to demonstrate decreased risk of falls.   Baseline: NT Goal status: MET- 08/28/22 23/24  7.  [name] will demonstrate improved functional LE strength by demonstrating 5/5 R quad strength Baseline: 2/5 with quad lag Goal status: MET- 09/11/22  8.  [name] will demonstrate >40/80 on LEFS to demonstrate improved mobility.  Baseline: 23/80 Goal status: IN PROGRESS  PLAN:  PT FREQUENCY: 2x/week  PT DURATION: 6 weeks  PLANNED INTERVENTIONS: Therapeutic exercises, Therapeutic activity, Neuromuscular re-education, Balance training, Gait training, Patient/Family education, Self Care, Joint mobilization, Stair training, Dry Needling, Electrical stimulation, Cryotherapy, Moist heat, Taping, Vasopneumatic device, Ultrasound, Manual therapy, and Re-evaluation  PLAN FOR NEXT SESSION: continued to focus on higher level balance activities. Prepare for D/C   Artist Pais, PTA 09/11/2022, 11:47 AM

## 2022-09-15 ENCOUNTER — Ambulatory Visit: Payer: Medicare Other

## 2022-09-15 DIAGNOSIS — R262 Difficulty in walking, not elsewhere classified: Secondary | ICD-10-CM | POA: Diagnosis not present

## 2022-09-15 DIAGNOSIS — M25561 Pain in right knee: Secondary | ICD-10-CM | POA: Diagnosis not present

## 2022-09-15 DIAGNOSIS — R600 Localized edema: Secondary | ICD-10-CM | POA: Diagnosis not present

## 2022-09-15 DIAGNOSIS — M25661 Stiffness of right knee, not elsewhere classified: Secondary | ICD-10-CM | POA: Diagnosis not present

## 2022-09-15 DIAGNOSIS — M6281 Muscle weakness (generalized): Secondary | ICD-10-CM

## 2022-09-15 NOTE — Therapy (Signed)
OUTPATIENT PHYSICAL THERAPY TREATMENT       Patient Name: [name] MRN: [id number] DOB:11-Jul-1945, 77 y.o., male Today's Date: 09/15/2022  END OF SESSION:  PT End of Session - 09/15/22 1052     Visit Number 11    Number of Visits 12    Date for PT Re-Evaluation 09/23/22    Authorization Type Medicare + Tric[employer] for Life    PT Start Time 1020    PT Stop Time 1100    PT Time Calculation (min) 40 min    Activity Tolerance Patient tolerated treatment well    Behavior During Therapy [facility] for tasks assessed/performed                    Past Medical History:  Diagnosis Date   Abnormal CT scan, gastrointestinal tract suspect angioedema    Anemia 03/16/2016   IRON   Anxiety    Arthritis    Back pain 10/10/2012   Fatty liver 2005   seen on 2005 ultrasound,05/2015 CT.    Hemorrhoids 08/22/2016   Hemorrhoids, internal, with bleeding 2005   internal and external. 2005 band ligation (dr Ferdinand Lango in [facility])   Hyperglycemia 08/21/2013   Hyperlipidemia    Hypertension    Nocturia 08/21/2013   Obesity (BMI 30.0-34.9)    Osteoporosis    Pneumonia    as a child   Rectal bleeding 07/30/2014   Renal cyst    SBO (small bowel obstruction) ([place])    Tubular adenoma of colon before 2005, 2012, 08/2014   Unspecified constipation 02/06/2014   Vitamin D deficiency    Past Surgical History:  Procedure Laterality Date   COLONOSCOPY  before 2005, 2012, 2013, 08/2014.    ENTEROSCOPY N/A 10/05/2017   Procedure: ENTEROSCOPY;  Surgeon: Gatha Mayer, MD;  Location: [facility];  Service: Endoscopy;  Laterality: N/A;   FRACTURE SURGERY Left 1989   leg   HEMORRHOID BANDING  2005   IR RADIOLOGIST EVAL & MGMT  11/11/2021   KNEE SURGERY Left 2010   arthroscopy, torn carilage   KNEE SURGERY Right 2012   arthroscopy   TONSILLECTOMY  1957   TONSILLECTOMY     TOTAL KNEE ARTHROPLASTY Right 07/24/2022   Procedure: RIGHT TOTAL KNEE ARTHROPLASTY;  Surgeon: Meredith Pel, MD;  Location:  [place];  Service: Orthopedics;  Laterality: Right;   TOTAL SHOULDER ARTHROPLASTY Left 11/01/2020   Procedure: LEFT ANATOMIC SHOULDER REPLACEMENT;  Surgeon: Meredith Pel, MD;  Location: [place];  Service: Orthopedics;  Laterality: Left;   VENTRAL HERNIA REPAIR N/A 06/03/2018   Procedure: LAPAROSCOPIC VENTRAL WALL  HERNIA  REPAIR WITH MESH  ERAS PATHWAY;  Surgeon: Michael Boston, MD;  Location: [facility];  Service: General;  Laterality: N/A;   Patient Active Problem List   Diagnosis Date Noted   Arthritis of right knee 07/27/2022   S/P total knee arthroplasty, right 07/24/2022   Shoulder arthritis    S/P shoulder replacement, 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   Skin tags, multiple acquired 07/07/2019   LUQ pain 07/07/2019   OA (osteoarthritis) of knee 03/16/2019   Ventral hernia s/p lap repair with mesh 06/03/2018 06/03/2018   Hemorrhoids 08/22/2016   Anemia 03/16/2016   Right shoulder pain 02/26/2016   Abnormal CT scan, gastrointestinal tract suspect angioedema    Abdominal pain    Hyperglycemia 08/21/2013   Low back pain 10/10/2012   Hyperlipidemia    Hypertension, essential     Obesity (BMI 30.0-34.9)     PCP: Penni Homans  A, MD   REFERRING PROVIDER: Meredith Pel, MD   REFERRING DIAG: s/p R TKA   THERAPY DIAG:  Stiffness of right knee, not elsewhere classified  Localized edema  Difficulty in walking, not elsewhere classified  Muscle weakness (generalized)  Rationale for Evaluation and Treatment: Rehabilitation  ONSET DATE: 07/24/2022  SUBJECTIVE:   SUBJECTIVE STATEMENT:  Doing good, the knee just feels tight.  NEXT MD VISIT: returns in 6 weeks   PERTINENT HISTORY: History back pain, L TSA, Ventral hernia repair PAIN:  Are you having pain? Yes: NPRS scale: 4/10 Pain location: across front of knee R Pain description: ache Aggravating factors: laying in bed Relieving factors: cold  PRECAUTIONS: None  WEIGHT BEARING RESTRICTIONS: No  FALLS:  Has patient fallen in  last 6 months? No  LIVING ENVIRONMENT: Lives with: lives with their spouse Lives in: House/apartment Stairs: Yes: Internal: 12 steps; on right going up and External: 2 steps; none Has following equipment at home: Walker - 2 wheeled  OCCUPATION: retired   PLOF: Independent and Leisure: golf, yard work, gym- bike  PATIENT GOALS: get back to normal life- walking without pain, playing golf, get back to gym, be active, sit down and not hurt.    OBJECTIVE:   DIAGNOSTIC FINDINGS: 08/08/22 AP, lateral views of right knee reviewed. Right total knee prosthesis in excellent position and alignment without any complicating features. No fracture. No dislocation.   PATIENT SURVEYS:  LEFS 28.75%  COGNITION: Overall cognitive status: Within functional limits for tasks assessed     SENSATION: Occasional numbness/tingling to R foot, but intact to light touch  EDEMA:  Circumferential: R 44.5 cm, L 41.5 cm  MUSCLE LENGTH: Hamstrings: tightness R hamstring, lacking full extension.   POSTURE: weight shift left  PALPATION: Warmth in R knee but no signs of infection, no tenderness in calf, good patellar mobility, good scar mobility.   LOWER EXTREMITY ROM:  Passive ROM Right eval Left eval Right  Right 08/21/22 Right 08/28/22 Right 09/01/22 Right 09/11/22  Knee flexion 100 135 104 110 118-sitting 120 124  Knee extension 10 0 6 -4 6 LAQ 2-supine 4 LAQ 1 deg in supine 0 supine 2 LAQ   (Blank rows = not tested)  LOWER EXTREMITY MMT:  MMT Right eval Left eval  Hip flexion 5 5  Hip extension    Hip abduction 5 5  Hip adduction 5 5  Knee flexion    Knee extension 2 (30 deg quad lag) 5  Ankle dorsiflexion    Ankle plantarflexion     (Blank rows = not tested)  LOWER EXTREMITY SPECIAL TESTS:  NT  FUNCTIONAL TESTS:  5 times sit to stand: 13 seconds  GAIT: Distance walked: 50' Assistive device utilized: Environmental consultant - 2 wheeled Level of assistance: Modified independence Comments:  decreased weight shift to RLE, lacking R knee extension in stance.     TODAY'S TREATMENT:  DATE:  09/15/22 Therapeutic Exercise: to improve strength and mobility.  Demo, verbal and tactile cues throughout for technique. Bike L2 x 7 min Leg press 25# x 20  Neuromuscular Reeducation: to improve balance and stability. Clock balance 4x all the way around bil standing on balance disk  Single leg RDL bil 2x10 R SLS with ball toss 2 trials  Tandem walk 2x42ft Retro gait 2x25 ft   09/11/22 Therapeutic Exercise: to improve strength and mobility.  Demo, verbal and tactile cues throughout for technique. Bike L2 x 8 min Knee flexion 35# 2x10 Knee extension 20# x 20 bil Neuromuscular Reeducation: to improve balance and stability. Clock balance 3x all the way around  R SLS on airex x 30 sec on airex Tandem stance on airex 2x30 sec Tandem gait 2x37ft  Single R leg RDL x 10 R SLS with ball toss 2x10 Vaso x 10 min R knee low compression 34 deg   09/08/2022 Therapeutic Exercise: to improve strength and mobility.  Demo, verbal and tactile cues throughout for technique. Bike L2 x 6 min Hamstring curls 35# x 20, RLE only 20# 2 x 8 Knee extension 25# x 20, RLE 10# x 20  Eccentric heel raises x 15 Neuromuscular Reeducation: to improve balance and stability. SBA for safety throughout.  Star excursion pattern x 5 each side Tandem gait 2 x 10 steps - cues for posture Retro tandem  4 x 10 steps - cues for posture On airex: Eyes open feet together x 30 sec  Eyes open feet together with head nods x 10 Eyes open feet together with head turns x 10 Eyes closed feet together x 30 sec Eyes closed feet together with head nods x 10 Eyes closed feet together with head turns x 10  Tandem standing x 30 sec bil       PATIENT EDUCATION:  Education details: HEP update Person educated:  Patient Education method: Explanation, Demonstration, Verbal cues, and Handouts Education comprehension: verbalized understanding and returned demonstration  HOME EXERCISE PROGRAM: Access Code: [email or web address] URL: [email or web address] Date: 09/15/2022 Prepared by: Clarene Essex  Exercises - Single Leg Balance with Clock Reach  - 1 x daily - 7 x weekly - 2 sets - 10 reps - Forward T with Counter Support  - 1 x daily - 7 x weekly - 3 sets - 10 reps - Single Leg Stance  - 1 x daily - 7 x weekly - 3 sets - 30 seconds- 62min hold - Wall Squat  - 1 x daily - 7 x weekly - 3 sets - 10 reps  ASSESSMENT:  CLINICAL IMPRESSION: Advanced through knee strengthening, stabilization, and balance exercises to improve functional mobility.  Pt showed good demo of exercises with cues provided occasionally throughout session. He shows better balance on RLE with less LOB. He will start gym routine next week when finished with PT and based on functional improvements he plans to wrap up PT next visit.    OBJECTIVE IMPAIRMENTS: Abnormal gait, decreased activity tolerance, decreased endurance, decreased mobility, difficulty walking, decreased ROM, decreased strength, increased edema, increased muscle spasms, impaired flexibility, impaired sensation, and pain.   ACTIVITY LIMITATIONS: lifting, bending, sitting, standing, squatting, stairs, transfers, and locomotion level  PARTICIPATION LIMITATIONS: meal prep, cleaning, laundry, driving, shopping, community activity, and yard work  PERSONAL FACTORS: 1-2 comorbidities: osteoporosis, history back pain  are also affecting patient's functional outcome.   REHAB POTENTIAL: Excellent  CLINICAL DECISION MAKING: Stable/uncomplicated  EVALUATION COMPLEXITY: Low   GOALS: Goals reviewed with patient?  Yes   SHORT TERM GOALS: Target date: 08/26/2022    Independent with initial HEP. Baseline: given Goal status: MET 08/18/22- good compliance.    LONG TERM  GOALS: Target date: 09/23/2022   Independent with advanced/ongoing HEP to improve outcomes and carryover.  Baseline:  Goal status: IN PROGRESS 09/04/22- met for current.   2.  Ihor Dow will demonstrate R knee flexion to 120 deg to ascend/descend stairs. Baseline: 100 Goal status: MET 09/01/22 - 120 deg  3.  Ihor Dow will demonstrate full R knee extension for safety with gait. Baseline: lacking 10 Goal status: MET 09/11/22 - 0 deg in supine  4.  Amory Datta will be able to ambulate 600' safely with LRAD and normal gait pattern to access community.  Baseline: 2WRW needed Goal status: IN PROGRESS  08/18/22- 90' without AD  5.  Ihor Dow will be able to ascend/descend stairs with 1 HR and reciprocal step pattern safely to access home and community.  Baseline: HR or walker needed Goal status: MET 09/04/22  6.  Kristy Nygren will demonstrate > 19/24 on DGI to demonstrate decreased risk of falls.   Baseline: NT Goal status: MET- 08/28/22 23/24  7.  Ihor Dow will demonstrate improved functional LE strength by demonstrating 5/5 R quad strength Baseline: 2/5 with quad lag Goal status: MET- 09/11/22  8.  Ihor Dow will demonstrate >40/80 on LEFS to demonstrate improved mobility.  Baseline: 23/80 Goal status: IN PROGRESS  PLAN:  PT FREQUENCY: 2x/week  PT DURATION: 6 weeks  PLANNED INTERVENTIONS: Therapeutic exercises, Therapeutic activity, Neuromuscular re-education, Balance training, Gait training, Patient/Family education, Self Care, Joint mobilization, Stair training, Dry Needling, Electrical stimulation, Cryotherapy, Moist heat, Taping, Vasopneumatic device, Ultrasound, Manual therapy, and Re-evaluation  PLAN FOR NEXT SESSION: continued to focus on higher level balance activities. Prepare for D/C   Artist Pais, PTA 09/15/2022, 11:03 AM

## 2022-09-16 DIAGNOSIS — H04123 Dry eye syndrome of bilateral lacrimal glands: Secondary | ICD-10-CM | POA: Diagnosis not present

## 2022-09-18 ENCOUNTER — Encounter: Payer: Self-pay | Admitting: Surgical

## 2022-09-18 ENCOUNTER — Ambulatory Visit (INDEPENDENT_AMBULATORY_CARE_PROVIDER_SITE_OTHER): Payer: Medicare Other | Admitting: Surgical

## 2022-09-18 ENCOUNTER — Ambulatory Visit: Payer: Medicare Other

## 2022-09-18 ENCOUNTER — Other Ambulatory Visit: Payer: Self-pay

## 2022-09-18 DIAGNOSIS — M6281 Muscle weakness (generalized): Secondary | ICD-10-CM | POA: Diagnosis not present

## 2022-09-18 DIAGNOSIS — M19011 Primary osteoarthritis, right shoulder: Secondary | ICD-10-CM

## 2022-09-18 DIAGNOSIS — R262 Difficulty in walking, not elsewhere classified: Secondary | ICD-10-CM

## 2022-09-18 DIAGNOSIS — M25561 Pain in right knee: Secondary | ICD-10-CM | POA: Diagnosis not present

## 2022-09-18 DIAGNOSIS — R600 Localized edema: Secondary | ICD-10-CM

## 2022-09-18 DIAGNOSIS — M25661 Stiffness of right knee, not elsewhere classified: Secondary | ICD-10-CM | POA: Diagnosis not present

## 2022-09-18 MED ORDER — GABAPENTIN 300 MG PO CAPS: 300.0000 mg | ORAL_CAPSULE | Freq: Three times a day (TID) | ORAL | 0 refills | Status: DC

## 2022-09-18 NOTE — Therapy (Addendum)
OUTPATIENT PHYSICAL THERAPY TREATMENT PHYSICAL THERAPY DISCHARGE SUMMARY  Visits from Start of Care: 12  Current functional level related to goals / functional outcomes: All goals met, R knee ROM 0-120, independently ambulating without AD community distances without deviation, 23/24 on DGI, 50/80 on LEFS (23/80 at initial eval).    Remaining deficits: Tightness in R knee, some post-op pain still   Education / Equipment: HEP  Plan: Patient agrees to discharge.   Patient is being discharged due to meeting the stated rehab goals.   Rennie Natter, PT, DPT 6:15 PM 09/18/2022          Patient Name: Pavan Stopka MRN: [id number] DOB:1946/04/07, 77 y.o., male Today's Date: 09/18/2022  END OF SESSION:  PT End of Session - 09/18/22 1029     Visit Number 12    Number of Visits 12    Date for PT Re-Evaluation 09/23/22    Authorization Type Medicare + Tricare for Life    PT Start Time 1020    PT Stop Time 1053    PT Time Calculation (min) 33 min    Activity Tolerance Patient tolerated treatment well    Behavior During Therapy [facility] for tasks assessed/performed                     Past Medical History:  Diagnosis Date   Abnormal CT scan, gastrointestinal tract suspect angioedema    Anemia 03/16/2016   IRON   Anxiety    Arthritis    Back pain 10/10/2012   Fatty liver 2005   seen on 2005 ultrasound,05/2015 CT.    Hemorrhoids 08/22/2016   Hemorrhoids, internal, with bleeding 2005   internal and external. 2005 band ligation (dr Ferdinand Lango in [facility])   Hyperglycemia 08/21/2013   Hyperlipidemia    Hypertension    Nocturia 08/21/2013   Obesity (BMI 30.0-34.9)    Osteoporosis    Pneumonia    as a child   Rectal bleeding 07/30/2014   Renal cyst    SBO (small bowel obstruction) ([place])    Tubular adenoma of colon before 2005, 2012, 08/2014   Unspecified constipation 02/06/2014   Vitamin D deficiency    Past Surgical History:  Procedure Laterality Date   COLONOSCOPY   before 2005, 2012, 2013, 08/2014.    ENTEROSCOPY N/A 10/05/2017   Procedure: ENTEROSCOPY;  Surgeon: Gatha Mayer, MD;  [facility];  Service: Endoscopy;  Laterality: N/A;   FRACTURE SURGERY Left 1989   leg   HEMORRHOID BANDING  2005   IR RADIOLOGIST EVAL & MGMT  11/11/2021   KNEE SURGERY Left 2010   arthroscopy, torn carilage   KNEE SURGERY Right 2012   arthroscopy   TONSILLECTOMY  1957   TONSILLECTOMY     TOTAL KNEE ARTHROPLASTY Right 07/24/2022   Procedure: RIGHT TOTAL KNEE ARTHROPLASTY;  Surgeon: Meredith Pel, MD;  Location: [place];  Service: Orthopedics;  Laterality: Right;   TOTAL SHOULDER ARTHROPLASTY Left 11/01/2020   Procedure: LEFT ANATOMIC SHOULDER REPLACEMENT;  Surgeon: Meredith Pel, MD;  Location: [place];  Service: Orthopedics;  Laterality: Left;   VENTRAL HERNIA REPAIR N/A 06/03/2018   Procedure: LAPAROSCOPIC VENTRAL WALL  HERNIA  REPAIR WITH MESH  ERAS PATHWAY;  Surgeon: Michael Boston, MD;  [facility];  Service: General;  Laterality: N/A;   Patient Active Problem List   Diagnosis Date Noted   Arthritis of right knee 07/27/2022   S/P total knee arthroplasty, right 07/24/2022   Shoulder arthritis  S/P shoulder replacement, 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   Skin tags, multiple acquired 07/07/2019   LUQ pain 07/07/2019   OA (osteoarthritis) of knee 03/16/2019   Ventral hernia s/p lap repair with mesh 06/03/2018 06/03/2018   Hemorrhoids 08/22/2016   Anemia 03/16/2016   Right shoulder pain 02/26/2016   Abnormal CT scan, gastrointestinal tract suspect angioedema    Abdominal pain    Hyperglycemia 08/21/2013   Low back pain 10/10/2012   Hyperlipidemia    Hypertension, essential     Obesity (BMI 30.0-34.9)     PCP: Mosie Lukes, MD   REFERRING PROVIDER: Meredith Pel, MD   REFERRING DIAG: s/p R TKA   THERAPY DIAG:  Stiffness of right knee, not elsewhere classified  Localized edema  Difficulty in walking, not elsewhere classified  Muscle  weakness (generalized)  Acute pain of right knee  Rationale for Evaluation and Treatment: Rehabilitation  ONSET DATE: 07/24/2022  SUBJECTIVE:   SUBJECTIVE STATEMENT:  Doing good, the knee just feels tight.  NEXT MD VISIT: returns in 6 weeks   PERTINENT HISTORY: History back pain, L TSA, Ventral hernia repair PAIN:  Are you having pain? Yes: NPRS scale: 4/10 Pain location: across front of knee R Pain description: ache Aggravating factors: laying in bed Relieving factors: cold  PRECAUTIONS: None  WEIGHT BEARING RESTRICTIONS: No  FALLS:  Has patient fallen in last 6 months? No  LIVING ENVIRONMENT: Lives with: lives with their spouse Lives in: House/apartment Stairs: Yes: Internal: 12 steps; on right going up and External: 2 steps; none Has following equipment at home: Walker - 2 wheeled  OCCUPATION: retired   PLOF: Independent and Leisure: golf, yard work, gym- bike  PATIENT GOALS: get back to normal life- walking without pain, playing golf, get back to gym, be active, sit down and not hurt.    OBJECTIVE:   DIAGNOSTIC FINDINGS: 08/08/22 AP, lateral views of right knee reviewed. Right total knee prosthesis in excellent position and alignment without any complicating features. No fracture. No dislocation.   PATIENT SURVEYS:  LEFS 28.75%  COGNITION: Overall cognitive status: Within functional limits for tasks assessed     SENSATION: Occasional numbness/tingling to R foot, but intact to light touch  EDEMA:  Circumferential: R 44.5 cm, L 41.5 cm  MUSCLE LENGTH: Hamstrings: tightness R hamstring, lacking full extension.   POSTURE: weight shift left  PALPATION: Warmth in R knee but no signs of infection, no tenderness in calf, good patellar mobility, good scar mobility.   LOWER EXTREMITY ROM:  Passive ROM Right eval Left eval Right  Right 08/21/22 Right 08/28/22 Right 09/01/22 Right 09/11/22  Knee flexion 100 135 104 110 118-sitting 120 124  Knee extension  10 0 6 -4 6 LAQ 2-supine 4 LAQ 1 deg in supine 0 supine 2 LAQ   (Blank rows = not tested)  LOWER EXTREMITY MMT:  MMT Right eval Left eval  Hip flexion 5 5  Hip extension    Hip abduction 5 5  Hip adduction 5 5  Knee flexion    Knee extension 2 (30 deg quad lag) 5  Ankle dorsiflexion    Ankle plantarflexion     (Blank rows = not tested)  LOWER EXTREMITY SPECIAL TESTS:  NT  FUNCTIONAL TESTS:  5 times sit to stand: 13 seconds  GAIT: Distance walked: 50' Assistive device utilized: Environmental consultant - 2 wheeled Level of assistance: Modified independence Comments: decreased weight shift to RLE, lacking R knee extension in stance.     TODAY'S TREATMENT:  DATE:  09/18/22 Therapeutic Exercise: to improve strength and mobility.  Demo, verbal and tactile cues throughout for technique. Bike L4 x 8 min Lower Extremity Functional Score: 50 / 80 = 62.5 % Gait for 600' no AD, WFL  Therapeutic Activity: Review of gym equipment and HEP for clarification 09/15/22 Therapeutic Exercise: to improve strength and mobility.  Demo, verbal and tactile cues throughout for technique. Bike L2 x 7 min Leg press 25# x 20  Neuromuscular Reeducation: to improve balance and stability. Clock balance 4x all the way around bil standing on balance disk  Single leg RDL bil 2x10 R SLS with ball toss 2 trials  Tandem walk 2x42ft Retro gait 2x25 ft   09/11/22 Therapeutic Exercise: to improve strength and mobility.  Demo, verbal and tactile cues throughout for technique. Bike L2 x 8 min Knee flexion 35# 2x10 Knee extension 20# x 20 bil Neuromuscular Reeducation: to improve balance and stability. Clock balance 3x all the way around  R SLS on airex x 30 sec on airex Tandem stance on airex 2x30 sec Tandem gait 2x36ft  Single R leg RDL x 10 R SLS with ball toss 2x10 Vaso x 10 min R knee  low compression 34 deg   09/08/2022 Therapeutic Exercise: to improve strength and mobility.  Demo, verbal and tactile cues throughout for technique. Bike L2 x 6 min Hamstring curls 35# x 20, RLE only 20# 2 x 8 Knee extension 25# x 20, RLE 10# x 20  Eccentric heel raises x 15 Neuromuscular Reeducation: to improve balance and stability. SBA for safety throughout.  Star excursion pattern x 5 each side Tandem gait 2 x 10 steps - cues for posture Retro tandem  4 x 10 steps - cues for posture On airex: Eyes open feet together x 30 sec  Eyes open feet together with head nods x 10 Eyes open feet together with head turns x 10 Eyes closed feet together x 30 sec Eyes closed feet together with head nods x 10 Eyes closed feet together with head turns x 10  Tandem standing x 30 sec bil       PATIENT EDUCATION:  Education details: HEP update Person educated: Patient Education method: Explanation, Demonstration, Verbal cues, and Handouts Education comprehension: verbalized understanding and returned demonstration  HOME EXERCISE PROGRAM: Access Code: [email or web address] URL: [email or web address] Date: 09/15/2022 Prepared by: Clarene Essex  Exercises - Single Leg Balance with Clock Reach  - 1 x daily - 7 x weekly - 2 sets - 10 reps - Forward T with Counter Support  - 1 x daily - 7 x weekly - 3 sets - 10 reps - Single Leg Stance  - 1 x daily - 7 x weekly - 3 sets - 30 seconds- 42min hold - Wall Squat  - 1 x daily - 7 x weekly - 3 sets - 10 reps  ASSESSMENT:  CLINICAL IMPRESSION: Mr Heineman has progressed very well with PT. He was able to demonstrate good walking pattern [facility] for at least 600' with no AD meeting LTG #4. He scored 50/80 on LEFS meeting LTG #8. He has no issues with HEP. Did a brief review of gym equipment and giving instructions on ways to progress HEP exercises. Patient has met all other goals in previous visits. Functionally he shows to be doing well and he is agreeable to  being D/C from PT at this time.    OBJECTIVE IMPAIRMENTS: Abnormal gait, decreased activity tolerance, decreased endurance, decreased mobility, difficulty walking, decreased ROM, decreased  strength, increased edema, increased muscle spasms, impaired flexibility, impaired sensation, and pain.   ACTIVITY LIMITATIONS: lifting, bending, sitting, standing, squatting, stairs, transfers, and locomotion level  PARTICIPATION LIMITATIONS: meal prep, cleaning, laundry, driving, shopping, community activity, and yard work  PERSONAL FACTORS: 1-2 comorbidities: osteoporosis, history back pain  are also affecting patient's functional outcome.   REHAB POTENTIAL: Excellent  CLINICAL DECISION MAKING: Stable/uncomplicated  EVALUATION COMPLEXITY: Low   GOALS: Goals reviewed with patient? Yes   SHORT TERM GOALS: Target date: 08/26/2022    Independent with initial HEP. Baseline: given Goal status: MET 08/18/22- good compliance.    LONG TERM GOALS: Target date: 09/23/2022   Independent with advanced/ongoing HEP to improve outcomes and carryover.  Baseline:  Goal status: MET 09/18/22   2.  Ihor Dow will demonstrate R knee flexion to 120 deg to ascend/descend stairs. Baseline: 100 Goal status: MET 09/01/22 - 120 deg  3.  Ihor Dow will demonstrate full R knee extension for safety with gait. Baseline: lacking 10 Goal status: MET 09/11/22 - 0 deg in supine  4.  Add Jezewski will be able to ambulate 600' safely with LRAD and normal gait pattern to access community.  Baseline: 2WRW needed Goal status: MET  - 09/18/22  5.  Ihor Dow will be able to ascend/descend stairs with 1 HR and reciprocal step pattern safely to access home and community.  Baseline: HR or walker needed Goal status: MET 09/04/22  6.  Levorn Elsmore will demonstrate > 19/24 on DGI to demonstrate decreased risk of falls.   Baseline: NT Goal status: MET- 08/28/22 23/24  7.  Ihor Dow will demonstrate improved  functional LE strength by demonstrating 5/5 R quad strength Baseline: 2/5 with quad lag Goal status: MET- 09/11/22  8.  Ihor Dow will demonstrate >40/80 on LEFS to demonstrate improved mobility.  Baseline: 23/80 Goal status: MET- 09/18/22  PLAN:  PT FREQUENCY: 2x/week  PT DURATION: 6 weeks  PLANNED INTERVENTIONS: Therapeutic exercises, Therapeutic activity, Neuromuscular re-education, Balance training, Gait training, Patient/Family education, Self Care, Joint mobilization, Stair training, Dry Needling, Electrical stimulation, Cryotherapy, Moist heat, Taping, Vasopneumatic device, Ultrasound, Manual therapy, and Re-evaluation  PLAN FOR NEXT SESSION: D/C to HEP   Atasha Colebank L Wrigley Plasencia, PTA 09/18/2022, 11:00 AM

## 2022-09-19 ENCOUNTER — Encounter: Payer: Self-pay | Admitting: Surgical

## 2022-09-19 MED ORDER — BUPIVACAINE HCL 0.5 % IJ SOLN
9.0000 mL | INTRAMUSCULAR | Status: AC | PRN
  Administered 2022-09-18: 9 mL via INTRA_ARTICULAR

## 2022-09-19 MED ORDER — METHYLPREDNISOLONE ACETATE 40 MG/ML IJ SUSP
40.0000 mg | INTRAMUSCULAR | Status: AC | PRN
  Administered 2022-09-18: 40 mg via INTRA_ARTICULAR

## 2022-09-19 MED ORDER — LIDOCAINE HCL 1 % IJ SOLN
5.0000 mL | INTRAMUSCULAR | Status: AC | PRN
  Administered 2022-09-18: 5 mL

## 2022-09-19 NOTE — Progress Notes (Signed)
   Procedure Note  Patient: [name]             Date of Birth: April 24, 1946           MRN: [id number]             Visit Date: 09/18/2022  Procedures: Visit Diagnoses:  1. Primary osteoarthritis, right shoulder     Large Joint Inj: R glenohumeral on 09/18/2022 10:12 AM Indications: diagnostic evaluation and pain Details: 22 G 1.5 in and 3.5 in needle, ultrasound-guided posterior approach  Arthrogram: No  Medications: 9 mL bupivacaine 0.5 %; 40 mg methylPREDNISolone acetate 40 MG/ML; 5 mL lidocaine 1 % Outcome: tolerated well, no immediate complications Procedure, treatment alternatives, risks and benefits explained, specific risks discussed. Consent was given by the patient. Immediately prior to procedure a time out was called to verify the correct patient, procedure, equipment, support staff and site/side marked as required. Patient was prepped and draped in the usual sterile fashion.

## 2022-10-13 ENCOUNTER — Other Ambulatory Visit: Payer: Self-pay | Admitting: Family

## 2022-10-13 ENCOUNTER — Encounter: Payer: Self-pay | Admitting: *Deleted

## 2022-10-14 ENCOUNTER — Telehealth: Payer: Self-pay | Admitting: Family Medicine

## 2022-10-14 ENCOUNTER — Other Ambulatory Visit: Payer: Self-pay

## 2022-10-14 MED ORDER — PITAVASTATIN CALCIUM 2 MG PO TABS: 1.0000 | ORAL_TABLET | Freq: Every day | ORAL | 2 refills | Status: DC

## 2022-10-14 NOTE — Telephone Encounter (Signed)
Medication sent.

## 2022-10-14 NOTE — Telephone Encounter (Signed)
Pt came in office stating is needing refill on Pitavastatin Calcium (LIVALO) 2 MG TABS  sent to  [facility] - [place], [place] - [address] [place] [address] Phone: [phone]  Fax: [phone]  Please advise.

## 2022-10-15 ENCOUNTER — Ambulatory Visit (INDEPENDENT_AMBULATORY_CARE_PROVIDER_SITE_OTHER): Payer: Medicare Other | Admitting: Surgical

## 2022-10-15 DIAGNOSIS — Z96651 Presence of right artificial knee joint: Secondary | ICD-10-CM

## 2022-10-17 ENCOUNTER — Telehealth: Payer: Self-pay | Admitting: *Deleted

## 2022-10-17 NOTE — Telephone Encounter (Signed)
Ortho bundle 90 day in office visit completed. 

## 2022-10-18 ENCOUNTER — Encounter: Payer: Self-pay | Admitting: Surgical

## 2022-10-18 NOTE — Progress Notes (Signed)
Post-Op Visit Note   Patient: [name]           Date of Birth: Nov 02, 1945           MRN: [id number] Visit Date: 10/15/2022 PCP: Bradd Canary, MD   Assessment & Plan:  Chief Complaint:  Chief Complaint  Patient presents with   Right Knee - Routine Post Op   Visit Diagnoses: No diagnosis found.  Plan: Patient is a 77 year old male who presents s/p right total knee arthroplasty.  He is about 3 months out from procedure.  He has finished physical therapy and is going to the gym and on his own time.  Taking ibuprofen as needed as well as gabapentin on occasion.  He is very satisfied with how his knee is feeling.  Feels much improved compared with prior to procedure.  He has no difficulty with going up or down stairs.  No difficulty with getting up from a low toilet seat.  He would like to return to golfing in full fashion.  On exam, patient has incision that is well-healed.  No evidence of infection or dehiscence.  No sinus tract noted.  He has range of motion from 0 degrees extension to 120 degrees of knee flexion.  No calf tenderness.  Negative Homans' sign.  No gross mid flexion instability.  Excellent quad strength rated 5/5.  Able to perform straight leg raise without extensor lag.  Intact ankle dorsiflexion and plantarflexion.  Ambulates without antalgia.  Not walking with any DME such as cane or walker.  Plan is to continue with dental antibiotic prophylaxis as he has done from his shoulder.  He will continue with home exercise program.  Okay to go back to golf in full capacity.  Call with any concerns.  Follow-Up Instructions: No follow-ups on file.   Orders:  No orders of the defined types were placed in this encounter.  No orders of the defined types were placed in this encounter.   Imaging: No results found.  PMFS History: Patient Active Problem List   Diagnosis Date Noted   Arthritis of right knee 07/27/2022   S/P total knee arthroplasty, right 07/24/2022    Shoulder arthritis    S/P shoulder replacement, 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   Skin tags, multiple acquired 07/07/2019   LUQ pain 07/07/2019   OA (osteoarthritis) of knee 03/16/2019   Ventral hernia s/p lap repair with mesh 06/03/2018 06/03/2018   Hemorrhoids 08/22/2016   Anemia 03/16/2016   Right shoulder pain 02/26/2016   Abnormal CT scan, gastrointestinal tract suspect angioedema    Abdominal pain    Hyperglycemia 08/21/2013   Low back pain 10/10/2012   Hyperlipidemia    Hypertension, essential     Obesity (BMI 30.0-34.9)    Past Medical History:  Diagnosis Date   Abnormal CT scan, gastrointestinal tract suspect angioedema    Anemia 03/16/2016   IRON   Anxiety    Arthritis    Back pain 10/10/2012   Fatty liver 2005   seen on 2005 ultrasound,05/2015 CT.    Hemorrhoids 08/22/2016   Hemorrhoids, internal, with bleeding 2005   internal and external. 2005 band ligation (dr Noe Gens in [facility])   Hyperglycemia 08/21/2013   Hyperlipidemia    Hypertension    Nocturia 08/21/2013   Obesity (BMI 30.0-34.9)    Osteoporosis    Pneumonia    as a child   Rectal bleeding 07/30/2014   Renal cyst    SBO (small bowel obstruction) (HCC)    Tubular  adenoma of colon before 2005, 2012, 08/2014   Unspecified constipation 02/06/2014   Vitamin D deficiency     Family History  Problem Relation Age of Onset   Stroke Mother    Aneurysm Mother        brain   Arthritis Father    Liver cancer Father        liver, atomic testing in military   Gallbladder disease Father    Hyperlipidemia Sister    Hypertension Sister    Obesity Sister    Diabetes Sister    Diabetes Brother    Hyperlipidemia Sister    Hypertension Sister    Obesity Sister    Diabetes Sister    Aneurysm Brother        brain   COPD Brother    Gallbladder disease Sister        x4   Colon cancer Neg Hx    Colon polyps Neg Hx    Esophageal cancer Neg Hx    Rectal cancer Neg Hx    Stomach cancer Neg Hx    Prostate cancer Neg Hx    CAD  Neg Hx     Past Surgical History:  Procedure Laterality Date   COLONOSCOPY  before 2005, 2012, 2013, 08/2014.    ENTEROSCOPY N/A 10/05/2017   Procedure: ENTEROSCOPY;  Surgeon: Iva Boop, MD;  Location: [facility];  Service: Endoscopy;  Laterality: N/A;   FRACTURE SURGERY Left 1989   leg   HEMORRHOID BANDING  2005   IR RADIOLOGIST EVAL & MGMT  11/11/2021   KNEE SURGERY Left 2010   arthroscopy, torn carilage   KNEE SURGERY Right 2012   arthroscopy   TONSILLECTOMY  1957   TONSILLECTOMY     TOTAL KNEE ARTHROPLASTY Right 07/24/2022   Procedure: RIGHT TOTAL KNEE ARTHROPLASTY;  Surgeon: Cammy Copa, MD;  Location: [facility] OR;  Service: Orthopedics;  Laterality: Right;   TOTAL SHOULDER ARTHROPLASTY Left 11/01/2020   Procedure: LEFT ANATOMIC SHOULDER REPLACEMENT;  Surgeon: Cammy Copa, MD;  Location: [facility] OR;  Service: Orthopedics;  Laterality: Left;   VENTRAL HERNIA REPAIR N/A 06/03/2018   Procedure: LAPAROSCOPIC VENTRAL WALL  HERNIA  REPAIR WITH MESH  ERAS PATHWAY;  Surgeon: Karie Soda, MD;  Location: [facility];  Service: General;  Laterality: N/A;   Social History   Occupational History   Occupation: Retired    Associate Professor: [employer]  Tobacco Use   Smoking status: Never   Smokeless tobacco: Never  Vaping Use   Vaping Use: Never used  Substance and Sexual Activity   Alcohol use: Not Currently    Comment: Occassionally   Drug use: No   Sexual activity: Yes

## 2022-10-22 ENCOUNTER — Other Ambulatory Visit: Payer: Self-pay

## 2022-10-22 MED ORDER — ROSUVASTATIN CALCIUM 20 MG PO TABS: 20.0000 mg | ORAL_TABLET | Freq: Every day | ORAL | 3 refills | Status: DC

## 2022-10-22 NOTE — Telephone Encounter (Signed)
Called pt regarding switch to Rosuvastatin Pt agree and medication has been sent.

## 2022-10-22 NOTE — Telephone Encounter (Signed)
Pt dropped off a copy of an email about his medication that was requested for him, [facility] stating that the Medication prescribed  Pitavastatin Calcium (LIVALO) 2 MG TABS cost higher, so insurance is giving other options if ok with provider to  have it change if necessary. Pt left a copy of letter at front desk. Please advise pt if ok to stay with [facility] or if provider thinks it is ok to change to another meds that was suggested by insurance. Pt tel [phone]

## 2022-11-09 ENCOUNTER — Other Ambulatory Visit: Payer: Self-pay | Admitting: Family Medicine

## 2022-11-10 NOTE — Assessment & Plan Note (Signed)
Tolerating statin, encouraged heart healthy diet, avoid trans fats, minimize simple carbs and saturated fats. Increase exercise as tolerated 

## 2022-11-10 NOTE — Assessment & Plan Note (Addendum)
hgba1c acceptable, minimize simple carbs. Increase exercise as tolerated.  

## 2022-11-10 NOTE — Assessment & Plan Note (Signed)
Well controlled, no changes to meds. Encouraged heart healthy diet such as the DASH diet and exercise as tolerated.  °

## 2022-11-11 ENCOUNTER — Ambulatory Visit (INDEPENDENT_AMBULATORY_CARE_PROVIDER_SITE_OTHER): Payer: Medicare Other | Admitting: Family Medicine

## 2022-11-11 ENCOUNTER — Other Ambulatory Visit (HOSPITAL_BASED_OUTPATIENT_CLINIC_OR_DEPARTMENT_OTHER): Payer: Self-pay

## 2022-11-11 ENCOUNTER — Ambulatory Visit (HOSPITAL_BASED_OUTPATIENT_CLINIC_OR_DEPARTMENT_OTHER)
Admission: RE | Admit: 2022-11-11 | Discharge: 2022-11-11 | Disposition: A | Discharge status: Home / Self Care | Payer: Medicare Other | Source: Ambulatory Visit | Attending: Family Medicine | Admitting: Family Medicine

## 2022-11-11 VITALS — BP 110/72 | HR 78 | Temp 98.0°F | Resp 16 | Ht 71.0 in | Wt 202.0 lb

## 2022-11-11 DIAGNOSIS — M25512 Pain in left shoulder: Secondary | ICD-10-CM | POA: Diagnosis not present

## 2022-11-11 DIAGNOSIS — M25511 Pain in right shoulder: Secondary | ICD-10-CM | POA: Insufficient documentation

## 2022-11-11 DIAGNOSIS — E785 Hyperlipidemia, unspecified: Secondary | ICD-10-CM | POA: Diagnosis not present

## 2022-11-11 DIAGNOSIS — R739 Hyperglycemia, unspecified: Secondary | ICD-10-CM | POA: Diagnosis not present

## 2022-11-11 DIAGNOSIS — M47812 Spondylosis without myelopathy or radiculopathy, cervical region: Secondary | ICD-10-CM | POA: Diagnosis not present

## 2022-11-11 DIAGNOSIS — I10 Essential (primary) hypertension: Secondary | ICD-10-CM | POA: Diagnosis not present

## 2022-11-11 LAB — CBC WITH DIFFERENTIAL/PLATELET
Basophils Absolute: 0 10*3/uL (ref 0.0–0.1)
Basophils Relative: 0.6 % (ref 0.0–3.0)
Eosinophils Absolute: 0.3 10*3/uL (ref 0.0–0.7)
Eosinophils Relative: 5.2 % — ABNORMAL HIGH (ref 0.0–5.0)
HCT: 36.7 % — ABNORMAL LOW (ref 39.0–52.0)
Hemoglobin: 12 g/dL — ABNORMAL LOW (ref 13.0–17.0)
Lymphocytes Relative: 35 % (ref 12.0–46.0)
Lymphs Abs: 1.8 10*3/uL (ref 0.7–4.0)
MCHC: 32.6 g/dL (ref 30.0–36.0)
MCV: 80.6 fl (ref 78.0–100.0)
Monocytes Absolute: 0.5 10*3/uL (ref 0.1–1.0)
Monocytes Relative: 10.1 % (ref 3.0–12.0)
Neutro Abs: 2.5 10*3/uL (ref 1.4–7.7)
Neutrophils Relative %: 49.1 % (ref 43.0–77.0)
Platelets: 235 10*3/uL (ref 150.0–400.0)
RBC: 4.56 Mil/uL (ref 4.22–5.81)
RDW: 15.7 % — ABNORMAL HIGH (ref 11.5–15.5)
WBC: 5.1 10*3/uL (ref 4.0–10.5)

## 2022-11-11 LAB — COMPREHENSIVE METABOLIC PANEL
ALT: 23 U/L (ref 0–53)
AST: 25 U/L (ref 0–37)
Albumin: 4.3 g/dL (ref 3.5–5.2)
Alkaline Phosphatase: 104 U/L (ref 39–117)
BUN: 11 mg/dL (ref 6–23)
CO2: 28 mEq/L (ref 19–32)
Calcium: 9.6 mg/dL (ref 8.4–10.5)
Chloride: 106 mEq/L (ref 96–112)
Creatinine, Ser: 1.08 mg/dL (ref 0.40–1.50)
GFR: 67.5 mL/min (ref >60.00)
Glucose, Bld: 99 mg/dL (ref 70–99)
Potassium: 4.4 mEq/L (ref 3.5–5.1)
Sodium: 141 mEq/L (ref 135–145)
Total Bilirubin: 0.7 mg/dL (ref 0.2–1.2)
Total Protein: 7.1 g/dL (ref 6.0–8.3)

## 2022-11-11 LAB — LIPID PANEL
Cholesterol: 109 mg/dL (ref 0–200)
HDL: 46.8 mg/dL (ref >39.00)
LDL Cholesterol: 49 mg/dL (ref 0–99)
NonHDL: 62.43
Total CHOL/HDL Ratio: 2
Triglycerides: 69 mg/dL (ref 0.0–149.0)
VLDL: 13.8 mg/dL (ref 0.0–40.0)

## 2022-11-11 LAB — HEMOGLOBIN A1C: Hgb A1c MFr Bld: 6.2 % (ref 4.6–6.5)

## 2022-11-11 LAB — TSH: TSH: 1.16 u[IU]/mL (ref 0.35–5.50)

## 2022-11-11 MED ORDER — TIZANIDINE HCL 2 MG PO TABS: 1.0000 mg | ORAL_TABLET | Freq: Two times a day (BID) | ORAL | 1 refills | Status: DC | PRN

## 2022-11-11 MED ORDER — COMIRNATY 30 MCG/0.3ML IM SUSY
0.3000 mL | PREFILLED_SYRINGE | Freq: Once | INTRAMUSCULAR | 0 refills | Status: AC
  Filled 2022-11-11: qty 0.3, 1d supply, fill #0

## 2022-11-11 NOTE — Progress Notes (Signed)
Subjective:   By signing my name below, I, Vickey Sages, attest that this documentation has been prepared under the direction and in the presence of Bradd Canary, MD., 11/11/2022.   Patient ID: [name], male    DOB: 1946/05/24, 77 y.o.   MRN: [id number]  Chief Complaint  Patient presents with   Follow-up    Follow up   HPI Patient is in today for an office visit.   Abdominal Camps/Constipation Patient reports that he takes Hyoscyamine 0.125 mg as needed to manage his occasional abdominal cramps. He further reports that he experiences occasional  constipation and is sometimes noticing  blood in his stool when straining. He is not straining often. He states that he takes Miralax and stool softeners such as Docusate which are helpful. Today he denies nausea/vomiting/diarrhea. His last colonoscopy was performed on 07/31/2021 by Dr. Stan Head which revealed two diminutive polyps in the transverse colon and in the ascending colon, diverticulosis in the sigmoid colon, and external and internal hemorrhoids.  Bilateral Shoulder/Right Knee Pain Patient underwent left anatomic shoulder replacement on 11/01/2020 and right total knee arthroplasty on 07/24/2022, both performed by Dr. Cammy Copa. He reports that his left shoulder recovered well, but he has been experiencing bilateral shoulder pain for the past several months. The pain is constant and isolated in the shoulders. Additionally, he complains that his right knee has been sore since his surgery. He has been taking Meloxicam 15 mg daily and applying Voltaren gel as needed to his right knee and shoulders which he states has been providing temporary relief. Today he denies CP/palpitations/SOB/HA/fever/chills/or GU symptoms.  Immunizations Patient is interested in receiving COVID-19 and RSV vaccinations and will go to the pharmacy for these.  Past Medical History:  Diagnosis Date   Abnormal CT scan, gastrointestinal tract  suspect angioedema    Anemia 03/16/2016   IRON   Anxiety    Arthritis    Back pain 10/10/2012   Fatty liver 2005   seen on 2005 ultrasound,05/2015 CT.    Hemorrhoids 08/22/2016   Hemorrhoids, internal, with bleeding 2005   internal and external. 2005 band ligation (dr Noe Gens in [facility])   Hyperglycemia 08/21/2013   Hyperlipidemia    Hypertension    Nocturia 08/21/2013   Obesity (BMI 30.0-34.9)    Osteoporosis    Pneumonia    as a child   Rectal bleeding 07/30/2014   Renal cyst    SBO (small bowel obstruction) (HCC)    Tubular adenoma of colon before 2005, 2012, 08/2014   Unspecified constipation 02/06/2014   Vitamin D deficiency     Past Surgical History:  Procedure Laterality Date   COLONOSCOPY  before 2005, 2012, 2013, 08/2014.    ENTEROSCOPY N/A 10/05/2017   Procedure: ENTEROSCOPY;  Surgeon: Iva Boop, MD;  Location: [facility];  Service: Endoscopy;  Laterality: N/A;   FRACTURE SURGERY Left 1989   leg   HEMORRHOID BANDING  2005   IR RADIOLOGIST EVAL & MGMT  11/11/2021   KNEE SURGERY Left 2010   arthroscopy, torn carilage   KNEE SURGERY Right 2012   arthroscopy   TONSILLECTOMY  1957   TONSILLECTOMY     TOTAL KNEE ARTHROPLASTY Right 07/24/2022   Procedure: RIGHT TOTAL KNEE ARTHROPLASTY;  Surgeon: Cammy Copa, MD;  Location[facility] OR;  Service: Orthopedics;  Laterality: Right;   TOTAL SHOULDER ARTHROPLASTY Left 11/01/2020   Procedure: LEFT ANATOMIC SHOULDER REPLACEMENT;  Surgeon: Cammy Copa, MD;  Location: [facility] OR;  Service: Orthopedics;  Laterality: Left;   VENTRAL HERNIA REPAIR N/A 06/03/2018   Procedure: LAPAROSCOPIC VENTRAL WALL  HERNIA  REPAIR WITH MESH  ERAS PATHWAY;  Surgeon: Karie Soda, MD;  Location: [facility];  Service: General;  Laterality: N/A;    Family History  Problem Relation Age of Onset   Stroke Mother    Aneurysm Mother        brain   Arthritis Father    Liver cancer Father        liver, atomic testing in military   Gallbladder  disease Father    Hyperlipidemia Sister    Hypertension Sister    Obesity Sister    Diabetes Sister    Diabetes Brother    Hyperlipidemia Sister    Hypertension Sister    Obesity Sister    Diabetes Sister    Aneurysm Brother        brain   COPD Brother    Gallbladder disease Sister        x4   Colon cancer Neg Hx    Colon polyps Neg Hx    Esophageal cancer Neg Hx    Rectal cancer Neg Hx    Stomach cancer Neg Hx    Prostate cancer Neg Hx    CAD Neg Hx     Social History   Socioeconomic History   Marital status: Married    Spouse name: Not on file   Number of children: 1   Years of education: Not on file   Highest education level: Not on file  Occupational History   Occupation: Retired    Associate Professor: [employer]  Tobacco Use   Smoking status: Never   Smokeless tobacco: Never  Vaping Use   Vaping Use: Never used  Substance and Sexual Activity   Alcohol use: Not Currently    Comment: Occassionally   Drug use: No   Sexual activity: Yes  Other Topics Concern   Not on file  Social History Narrative   Married - 1 child   Retired from [employer]   Some EtOH, no tbacco/drug use   Social Determinants of Health   Financial Resource Strain: Low Risk  (04/22/2021)   Overall Financial Resource Strain (CARDIA)    Difficulty of Paying Living Expenses: Not hard at all  Food Insecurity: No Food Insecurity (07/24/2022)   Hunger Vital Sign    Worried About Running Out of Food in the Last Year: Never true    Ran Out of Food in the Last Year: Never true  Transportation Needs: No Transportation Needs (07/24/2022)   PRAPARE - Administrator, Civil Service (Medical): No    Lack of Transportation (Non-Medical): No  Physical Activity: Sufficiently Active (04/22/2021)   Exercise Vital Sign    Days of Exercise per Week: 4 days    Minutes of Exercise per Session: 40 min  Stress: No Stress Concern Present (04/22/2021)   [employer] of Occupational Health -  Occupational Stress Questionnaire    Feeling of Stress : Only a little  Social Connections: Socially Integrated (04/22/2021)   Social Connection and Isolation Panel [NHANES]    Frequency of Communication with Friends and Family: More than three times a week    Frequency of Social Gatherings with Friends and Family: More than three times a week    Attends Religious Services: More than 4 times per year    Active Member of [employer] or Organizations: Yes    Attends Banker Meetings: More than 4  times per year    Marital Status: Married  Catering manager Violence: Not At Risk (07/24/2022)   Humiliation, Afraid, Rape, and Kick questionnaire    Fear of Current or Ex-Partner: No    Emotionally Abused: No    Physically Abused: No    Sexually Abused: No    Outpatient Medications Prior to Visit  Medication Sig Dispense Refill   acetaminophen (TYLENOL) 500 MG tablet Take 1,000 mg by mouth every 6 (six) hours as needed for moderate pain.     ALPRAZolam (XANAX) 1 MG tablet TAKE 1 TABLET (1 MG TOTAL) BY MOUTH AT BEDTIME AS NEEDED FOR ANXIETY. 30 tablet 0   amLODipine (NORVASC) 10 MG tablet TAKE 1 TABLET DAILY 90 tablet 3   aspirin 81 MG chewable tablet Chew 1 tablet (81 mg total) by mouth 2 (two) times daily. 60 tablet 0   diclofenac Sodium (VOLTAREN) 1 % GEL Apply 2 g topically daily as needed (knee pain).     docusate sodium (COLACE) 100 MG capsule Take 1 capsule (100 mg total) by mouth 2 (two) times daily. 10 capsule 0   hydrocortisone (ANUSOL-HC) 25 MG suppository Place 1 suppository (25 mg total) rectally 2 (two) times daily as needed for hemorrhoids or anal itching. 60 suppository 1   hyoscyamine (ANASPAZ) 0.125 MG TBDP disintergrating tablet Place 1 tablet (0.125 mg total) under the tongue every 4 (four) hours as needed for cramping. 30 tablet 2   losartan (COZAAR) 50 MG tablet TAKE 1 TABLET DAILY (NEED FURTHER EVALUATION AND/OR LABORATORY TESTING BEFORE FURTHER REFILLS GIVEN, MAKE  APPOINTMENT FOR THIS) 90 tablet 0   meloxicam (MOBIC) 15 MG tablet TAKE 1 TABLET (15 MG TOTAL) BY MOUTH DAILY. 45 tablet 1   metoprolol succinate (TOPROL-XL) 50 MG 24 hr tablet TAKE AS INSTRUCTED BY YOUR PRESCRIBER 90 tablet 0   Multiple Vitamins-Minerals (MULTIVITAMIN ADULT PO) Take 2 tablets by mouth daily.     rosuvastatin (CRESTOR) 20 MG tablet Take 1 tablet (20 mg total) by mouth daily. 90 tablet 3   tamsulosin (FLOMAX) 0.4 MG CAPS capsule Take 0.4 mg by mouth daily.     COVID-19 mRNA vaccine 2023-2024 (COMIRNATY) syringe Inject into the muscle. 0.3 mL 0   gabapentin (NEURONTIN) 300 MG capsule Take 1 capsule (300 mg total) by mouth 3 (three) times daily. 30 capsule 0   influenza vaccine adjuvanted (FLUAD QUADRIVALENT) 0.5 ML injection Inject into the muscle. 0.5 mL 0   methocarbamol (ROBAXIN) 500 MG tablet Take 1 tablet (500 mg total) by mouth every 8 (eight) hours as needed for muscle spasms. 30 tablet 0   oxyCODONE (OXY IR/ROXICODONE) 5 MG immediate release tablet Take 1 tablet (5 mg total) by mouth every 6 (six) hours as needed for moderate pain (pain score 4-6). 30 tablet 0   No facility-administered medications prior to visit.    No Known Allergies  Review of Systems  Constitutional:  Negative for chills and fever.  Respiratory:  Negative for shortness of breath.   Cardiovascular:  Negative for chest pain and palpitations.  Gastrointestinal:  Positive for abdominal pain, blood in stool and constipation. Negative for diarrhea, nausea and vomiting.  Genitourinary:  Negative for dysuria, frequency, hematuria and urgency.  Musculoskeletal:  Positive for joint pain (Patient is experiencing bilateral shoulder and right knee pain.).  Skin:           Neurological:  Negative for headaches.       Objective:    Physical Exam Constitutional:  General: He is not in acute distress.    Appearance: Normal appearance. He is not ill-appearing.  HENT:     Head: Normocephalic and  atraumatic.     Right Ear: External ear normal.     Left Ear: External ear normal.     Nose: Nose normal.     Mouth/Throat:     Mouth: Mucous membranes are moist.     Pharynx: Oropharynx is clear.  Eyes:     General:        Right eye: No discharge.        Left eye: No discharge.     Extraocular Movements: Extraocular movements intact.     Conjunctiva/sclera: Conjunctivae normal.     Pupils: Pupils are equal, round, and reactive to light.  Cardiovascular:     Rate and Rhythm: Normal rate and regular rhythm.     Pulses: Normal pulses.     Heart sounds: Normal heart sounds. No murmur heard.    No gallop.  Pulmonary:     Effort: Pulmonary effort is normal. No respiratory distress.     Breath sounds: Normal breath sounds. No wheezing or rales.  Abdominal:     General: Bowel sounds are normal.     Palpations: Abdomen is soft.     Tenderness: There is no abdominal tenderness. There is no guarding.  Musculoskeletal:        General: Normal range of motion.     Cervical back: Normal range of motion.     Right lower leg: No edema.     Left lower leg: No edema.  Skin:    General: Skin is warm and dry.     Comments: Multiple seborrhea keratoses across the abdomen and back.  Neurological:     Mental Status: He is alert and oriented to person, place, and time.  Psychiatric:        Mood and Affect: Mood normal.        Behavior: Behavior normal.        Judgment: Judgment normal.     BP 110/72 (BP Location: Right Arm, Patient Position: Sitting, Cuff Size: Normal)   Pulse 78   Temp 98 F (36.7 C) (Oral)   Resp 16   Ht 5\' 11"  (1.803 m)   Wt 202 lb (91.6 kg)   SpO2 98%   BMI 28.17 kg/m  Wt Readings from Last 3 Encounters:  11/11/22 202 lb (91.6 kg)  07/24/22 121 lb (54.9 kg)  07/21/22 212 lb (96.2 kg)    Diabetic Foot Exam - Simple   No data filed    Lab Results  Component Value Date   WBC 5.1 11/11/2022   HGB 12.0 (L) 11/11/2022   HCT 36.7 (L) 11/11/2022   PLT 235.0  11/11/2022   GLUCOSE 99 11/11/2022   CHOL 109 11/11/2022   TRIG 69.0 11/11/2022   HDL 46.80 11/11/2022   LDLCALC 49 11/11/2022   ALT 23 11/11/2022   AST 25 11/11/2022   NA 141 11/11/2022   K 4.4 11/11/2022   CL 106 11/11/2022   CREATININE 1.08 11/11/2022   BUN 11 11/11/2022   CO2 28 11/11/2022   TSH 1.16 11/11/2022   PSA 1.08 08/07/2014   HGBA1C 6.2 11/11/2022    Lab Results  Component Value Date   TSH 1.16 11/11/2022   Lab Results  Component Value Date   WBC 5.1 11/11/2022   HGB 12.0 (L) 11/11/2022   HCT 36.7 (L) 11/11/2022   MCV 80.6 11/11/2022  PLT 235.0 11/11/2022   Lab Results  Component Value Date   NA 141 11/11/2022   K 4.4 11/11/2022   CO2 28 11/11/2022   GLUCOSE 99 11/11/2022   BUN 11 11/11/2022   CREATININE 1.08 11/11/2022   BILITOT 0.7 11/11/2022   ALKPHOS 104 11/11/2022   AST 25 11/11/2022   ALT 23 11/11/2022   PROT 7.1 11/11/2022   ALBUMIN 4.3 11/11/2022   CALCIUM 9.6 11/11/2022   ANIONGAP 8 07/21/2022   GFR 67.50 11/11/2022   Lab Results  Component Value Date   CHOL 109 11/11/2022   Lab Results  Component Value Date   HDL 46.80 11/11/2022   Lab Results  Component Value Date   LDLCALC 49 11/11/2022   Lab Results  Component Value Date   TRIG 69.0 11/11/2022   Lab Results  Component Value Date   CHOLHDL 2 11/11/2022   Lab Results  Component Value Date   HGBA1C 6.2 11/11/2022      Assessment & Plan:  Abdominal Pain/Constipation: Recommended Benefiber with Miralax.  Bilateral Shoulder/Right Knee Pain: Prescribed Tizanidine 2 mg: take 0.5-2 tablets (1-4 mg total) by mouth 2 times daily as needed for muscle spasms. Take 1-2 Acetaminophen extra strength, up to 3 times daily (maximum of 3000 mg in 24 hours). X-ray of the neck ordered.  Healthy Lifestyle: Encouraged 6-8 hours of sleep, heart healthy diet with protein, 60-80 oz of non-alcohol/non-caffeinated fluids, and 4000-8000 steps daily.  Immunizations: Encouraged patient to  consider annual COVID-19 and Influenza vaccinations as well as RSV vaccination. Patient will receive COVID-19 and RSV vaccinations at the pharmacy.  Labs: Routine blood work ordered today. Problem List Items Addressed This Visit     Hyperglycemia - Primary    hgba1c acceptable, minimize simple carbs. Increase exercise as tolerated.       Relevant Orders   Hemoglobin A1c (Completed)   Hyperlipidemia    Tolerating statin, encouraged heart healthy diet, avoid trans fats, minimize simple carbs and saturated fats. Increase exercise as tolerated      Relevant Orders   Lipid panel (Completed)   Hypertension, essential     Well controlled, no changes to meds. Encouraged heart healthy diet such as the DASH diet and exercise as tolerated.        Relevant Orders   CBC with Differential/Platelet (Completed)   Comprehensive metabolic panel (Completed)   TSH (Completed)   Other Visit Diagnoses     Bilateral shoulder pain, unspecified chronicity       Relevant Orders   DG Cervical Spine Complete      Meds ordered this encounter  Medications   tiZANidine (ZANAFLEX) 2 MG tablet    Sig: Take 0.5-2 tablets (1-4 mg total) by mouth 2 (two) times daily as needed for muscle spasms.    Dispense:  30 tablet    Refill:  1   I, Danise Edge, MD, personally preformed the services described in this documentation.  All medical record entries made by the scribe were at my direction and in my presence.  I have reviewed the chart and discharge instructions (if applicable) and agree that the record reflects my personal performance and is accurate and complete. 11/11/2022  I,Mohammed Iqbal,acting as a scribe for Danise Edge, MD.,have documented all relevant documentation on the behalf of Danise Edge, MD,as directed by  Danise Edge, MD while in the presence of Danise Edge, MD.  Danise Edge, MD

## 2022-11-11 NOTE — Patient Instructions (Addendum)
Miralax and Benefiber daily If blood persists consider having the hemorrhoids looked at Tylenol/Acetaminophen ES 500 mg 1-2 tabs up to three x day (Max of 3000 mg in 24 hours or 6 tabs) Protein every 4-5 hours.   RSV, Respiratory Syncitial Virus Vaccine, Arexvy at pharmacy

## 2022-11-12 ENCOUNTER — Other Ambulatory Visit (INDEPENDENT_AMBULATORY_CARE_PROVIDER_SITE_OTHER): Payer: Medicare Other

## 2022-11-12 ENCOUNTER — Encounter: Payer: Self-pay | Admitting: Family Medicine

## 2022-11-12 ENCOUNTER — Other Ambulatory Visit: Payer: Self-pay

## 2022-11-12 DIAGNOSIS — D649 Anemia, unspecified: Secondary | ICD-10-CM | POA: Diagnosis not present

## 2022-11-12 LAB — IBC PANEL
Iron: 59 ug/dL (ref 42–165)
Saturation Ratios: 13 % — ABNORMAL LOW (ref 20.0–50.0)
TIBC: 455 ug/dL — ABNORMAL HIGH (ref 250.0–450.0)
Transferrin: 325 mg/dL (ref 212.0–360.0)

## 2022-11-12 MED ORDER — IRON (FERROUS SULFATE) 325 (65 FE) MG PO TABS: 325.0000 mg | ORAL_TABLET | Freq: Every day | ORAL | 1 refills | Status: AC

## 2022-11-12 NOTE — Addendum Note (Signed)
Addended by: Kathi Ludwig on: 11/12/2022 11:12 AM   Modules accepted: Orders

## 2022-11-18 ENCOUNTER — Telehealth: Payer: Self-pay | Admitting: Family Medicine

## 2022-11-18 ENCOUNTER — Other Ambulatory Visit: Payer: Medicare Other

## 2022-11-18 DIAGNOSIS — D649 Anemia, unspecified: Secondary | ICD-10-CM

## 2022-11-18 NOTE — Telephone Encounter (Signed)
Dennison Mascot [facility]) called stating they had received a fecal blood occult test for a pt that she was unsure what to do with. After reviewing chart, it looks like it may have been sent to them in error. [name] a message would be sent back to look into where it was supposed to go and would reach back to her at:  [phone]

## 2022-11-18 NOTE — Telephone Encounter (Signed)
Called spoke with  Dennison Mascot [facility]) called stating they had received a fecal blood occult test   And after speaking with Charisma from our lab. She stated that our lab carrier taking it by mistake over to [facility] ,but will  have him to pick it up tomorrow. Kim from [facility] labs was advised to hold until tomorrow stated she understand.

## 2022-11-25 ENCOUNTER — Telehealth: Payer: Self-pay | Admitting: *Deleted

## 2022-11-25 NOTE — Telephone Encounter (Signed)
Per [facility] courier, he inquired with cytology about the IFOB and the contact person he was supposed to ask for was not there at the time.  Spoke with Corrie Dandy in cytology and states that Amy took specimen  to the [facility] department.  Spoke with [facility] in the hospital and they have no record of receiving specimen. Spoke with [facility] customer service and they have no record of order since 2019.  Specimen will need to be recollected at this point.  Left message for pt to return my call.

## 2022-11-25 NOTE — Telephone Encounter (Signed)
Opened in error. See phone note from 11/18/22.

## 2022-11-25 NOTE — Telephone Encounter (Signed)
Pt returned my call and was notified that  IFOB was lost in transit and needs recollecting.  Pt voices understanding and will pick up another kit.  Kit placed at front desk.

## 2022-11-25 NOTE — Addendum Note (Signed)
Addended by: Mervin Kung A on: 11/25/2022 12:01 PM   Modules accepted: Orders

## 2022-11-26 ENCOUNTER — Other Ambulatory Visit (INDEPENDENT_AMBULATORY_CARE_PROVIDER_SITE_OTHER): Payer: Medicare Other

## 2022-11-26 DIAGNOSIS — D649 Anemia, unspecified: Secondary | ICD-10-CM

## 2022-11-26 LAB — FECAL OCCULT BLOOD, IMMUNOCHEMICAL: Fecal Occult Bld: NEGATIVE

## 2023-01-07 ENCOUNTER — Other Ambulatory Visit: Payer: Self-pay

## 2023-01-07 ENCOUNTER — Encounter: Payer: Self-pay | Admitting: Orthopedic Surgery

## 2023-01-07 ENCOUNTER — Ambulatory Visit (INDEPENDENT_AMBULATORY_CARE_PROVIDER_SITE_OTHER): Payer: Medicare Other | Admitting: Orthopedic Surgery

## 2023-01-07 DIAGNOSIS — M19011 Primary osteoarthritis, right shoulder: Secondary | ICD-10-CM

## 2023-01-07 MED ORDER — PREDNISONE 5 MG (21) PO TBPK: ORAL_TABLET | ORAL | 0 refills | Status: DC

## 2023-01-07 NOTE — Progress Notes (Signed)
Office Visit Note   Patient: [name]           Date of Birth: January 10, 1946           MRN: [id number] Visit Date: 01/07/2023 Requested by: Bradd Canary, MD [address] [place],  [place] [address] PCP: Bradd Canary, MD  Subjective: Chief Complaint  Patient presents with   Right Shoulder - Pain    HPI: [name] is a 77 y.o. male who presents to the office reporting Right shoulder pain.  Prior glenohumeral joint injection 09/18/2022 gave him only 1 week of relief.  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  Today he is having difficulty AB ducting the right arm.  No fevers and chills.  Uses Tylenol and ibuprofen without much relief.  Cough is difficult for him.  He is prediabetic.  He also reports a little swelling in the right knee.  Left shoulder is doing well from total shoulder replacement..                ROS: All systems reviewed are negative as they relate to the chief complaint within the history of present illness.  Patient denies fevers or chills.  Assessment & Plan: Visit Diagnoses:  1. Primary osteoarthritis, right shoulder     Plan: Impression is well-functioning left total shoulder replacement.  Right shoulder has arthritis along with some rotator cuff weakness.  Plan is thin cut CT scan for patient specific instrumentation.  Reverse replacement likely in this case due to weakness and abduction due to likely rotator cuff pathology.  Medrol Dosepak 6-day course for generalized shoulder pain worse on the right than the left.  He does have trace effusion in the right knee but no warmth and no real pain in the knee.  That something we can follow.  The risk and benefits of shoulder replacement are discussed with [name] including not limited to infection or vessel damage incomplete pain relief as well as loss of some motion.  Patient understands risk benefits along with the rehabilitative process involved.  All questions  answered.  Follow-Up Instructions: No follow-ups on file.   Orders:  Orders Placed This Encounter  Procedures   CT SHOULDER RIGHT WO CONTRAST   Meds ordered this encounter  Medications   predniSONE (STERAPRED UNI-PAK 21 TAB) 5 MG (21) TBPK tablet    Sig: Take dosepak as directed    Dispense:  21 tablet    Refill:  0      Procedures: No procedures performed   Clinical Data: No additional findings.  Objective: Vital Signs: There were no vitals taken for this visit.  Physical Exam:  Constitutional: Patient appears well-developed HEENT:  Head: Normocephalic Eyes:EOM are normal Neck: Normal range of motion Cardiovascular: Normal rate Pulmonary/chest: Effort normal Neurologic: Patient is alert Skin: Skin is warm Psychiatric: Patient has normal mood and affect  Ortho Exam: Ortho exam demonstrates bilateral shoulder passive external rotation of 60 degrees.  Subscap strength on the left and right at 5 out of 5.  Supraspinatus strength on the right is 4 out of 5 and infraspinatus strength is 5- out of 5 on the right-hand side.  Passive range of motion is maintained but actively he can only get up to about 60 or 70 degrees of abduction on the right 110 on the left.  Deltoid fires.  Specialty Comments:  No specialty comments available.  Imaging: No results found.   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   Skin tags, multiple acquired 07/07/2019   LUQ pain 07/07/2019   OA (osteoarthritis) of knee 03/16/2019   Ventral hernia s/p lap repair with mesh 06/03/2018 06/03/2018   Hemorrhoids 08/22/2016   Anemia 03/16/2016   Right shoulder pain 02/26/2016   Abnormal CT scan, gastrointestinal tract suspect angioedema    Abdominal pain    Hyperglycemia 08/21/2013   Low back pain 10/10/2012    Hyperlipidemia    Hypertension, essential     Obesity (BMI 30.0-34.9)    Past Medical History:  Diagnosis Date   Abnormal CT scan, gastrointestinal tract suspect angioedema    Anemia 03/16/2016   IRON   Anxiety    Arthritis    Back pain 10/10/2012   Fatty liver 2005   seen on 2005 ultrasound,05/2015 CT.    Hemorrhoids 08/22/2016   Hemorrhoids, internal, with bleeding 2005   internal and external. 2005 band ligation (dr Noe Gens in [facility])   Hyperglycemia 08/21/2013   Hyperlipidemia    Hypertension    Nocturia 08/21/2013   Obesity (BMI 30.0-34.9)    Osteoporosis    Pneumonia    as a child   Rectal bleeding 07/30/2014   Renal cyst    SBO (small bowel obstruction) (HCC)    Tubular adenoma of colon before 2005, 2012, 08/2014   Unspecified constipation 02/06/2014   Vitamin D deficiency     Family History  Problem Relation Age of Onset   Stroke Mother    Aneurysm Mother        brain   Arthritis Father    Liver cancer Father        liver, atomic testing in military   Gallbladder disease Father    Hyperlipidemia Sister    Hypertension Sister    Obesity Sister    Diabetes Sister    Diabetes Brother    Hyperlipidemia Sister    Hypertension Sister    Obesity Sister    Diabetes Sister    Aneurysm Brother        brain   COPD Brother    Gallbladder disease Sister        x4   Colon cancer Neg Hx    Colon polyps Neg Hx    Esophageal cancer Neg Hx    Rectal cancer Neg Hx    Stomach cancer Neg Hx    Prostate cancer Neg Hx    CAD Neg Hx     Past Surgical History:  Procedure Laterality Date   COLONOSCOPY  before 2005, 2012, 2013, 08/2014.    ENTEROSCOPY N/A 10/05/2017   Procedure: ENTEROSCOPY;  Surgeon: Iva Boop, MD;  Location: [facility];  Service: Endoscopy;  Laterality: N/A;   FRACTURE SURGERY Left 1989   leg   HEMORRHOID BANDING  2005   IR RADIOLOGIST EVAL & MGMT  11/11/2021   KNEE SURGERY Left 2010   arthroscopy, torn carilage   KNEE SURGERY Right 2012    arthroscopy   TONSILLECTOMY  1957   TONSILLECTOMY     TOTAL KNEE ARTHROPLASTY Right 07/24/2022   Procedure: RIGHT TOTAL KNEE ARTHROPLASTY;  Surgeon: Cammy Copa, MD;  Location: [facility] OR;  Service: Orthopedics;  Laterality: Right;   TOTAL SHOULDER ARTHROPLASTY Left 11/01/2020   Procedure: LEFT ANATOMIC SHOULDER REPLACEMENT;  Surgeon: Cammy Copa, MD;  Location: [facility]  OR;  Service: Orthopedics;  Laterality: Left;   VENTRAL HERNIA REPAIR N/A 06/03/2018   Procedure: LAPAROSCOPIC VENTRAL WALL  HERNIA  REPAIR WITH MESH  ERAS PATHWAY;  Surgeon: Karie Soda, MD;  Location: [facility] ORS;  Service: General;  Laterality: N/A;   Social History   Occupational History   Occupation: Retired    Associate Professor: [employer]  Tobacco Use   Smoking status: Never   Smokeless tobacco: Never  Vaping Use   Vaping Use: Never used  Substance and Sexual Activity   Alcohol use: Not Currently    Comment: Occassionally   Drug use: No   Sexual activity: Yes

## 2023-01-10 ENCOUNTER — Other Ambulatory Visit: Payer: Self-pay | Admitting: Family Medicine

## 2023-01-12 ENCOUNTER — Other Ambulatory Visit: Payer: Self-pay | Admitting: Family Medicine

## 2023-01-22 ENCOUNTER — Other Ambulatory Visit: Payer: Medicare Other

## 2023-01-23 ENCOUNTER — Ambulatory Visit
Admission: RE | Admit: 2023-01-23 | Discharge: 2023-01-23 | Disposition: A | Discharge status: Home / Self Care | Payer: Medicare Other | Source: Ambulatory Visit | Attending: Orthopedic Surgery | Admitting: Orthopedic Surgery

## 2023-01-23 DIAGNOSIS — M19011 Primary osteoarthritis, right shoulder: Secondary | ICD-10-CM | POA: Diagnosis not present

## 2023-01-23 DIAGNOSIS — Z01818 Encounter for other preprocedural examination: Secondary | ICD-10-CM | POA: Diagnosis not present

## 2023-02-02 NOTE — Progress Notes (Signed)
Hi debbie is he posted for surgery yet? Ct results in

## 2023-02-16 DIAGNOSIS — H43811 Vitreous degeneration, right eye: Secondary | ICD-10-CM | POA: Diagnosis not present

## 2023-02-27 NOTE — Pre-Procedure Instructions (Signed)
Surgical Instructions   Your procedure is scheduled on March 12, 2023. Report to [facility] Main Entrance "A" at 5:30 A.M., then check in with the Admitting office. Any questions or running late day of surgery: call [phone]  Questions prior to your surgery date: call [phone], Monday-Friday, 8am-4pm. If you experience any cold or flu symptoms such as cough, fever, chills, shortness of breath, etc. between now and your scheduled surgery, please notify us at the above number.     Remember:  Do not eat after midnight the night before your surgery  You may drink clear liquids until 4:30 AM the morning of your surgery.   Clear liquids allowed are: Water, Non-Citrus Juices (without pulp), Carbonated Beverages, Clear Tea, Black Coffee Only (NO MILK, CREAM OR POWDERED CREAMER of any kind), and Gatorade.  Patient Instructions  The night before surgery:  No food after midnight. ONLY clear liquids after midnight  The day of surgery (if you do NOT have diabetes):  Drink ONE (1) Pre-Surgery Clear Ensure by 4:30 AM the morning of surgery. Drink in one sitting. Do not sip.  This drink was given to you during your hospital  pre-op appointment visit.  Nothing else to drink after completing the  Pre-Surgery Clear Ensure.         If you have questions, please contact your surgeon's office.     Take these medicines the morning of surgery with A SIP OF WATER: amLODipine (NORVASC)  metoprolol succinate (TOPROL-XL)  rosuvastatin (CRESTOR)  tamsulosin [facility])    May take these medicines IF NEEDED: ALPRAZolam Prudy Feeler)    One week prior to surgery, STOP taking any Aspirin (unless otherwise instructed by your surgeon) Aleve, Naproxen, Ibuprofen, Motrin, Advil, Goody's, BC's, all herbal medications, fish oil, and non-prescription vitamins. This includes your medication: diclofenac Sodium (VOLTAREN) GEL and meloxicam (MOBIC)                      Do NOT Smoke (Tobacco/Vaping) for 24  hours prior to your procedure.  If you use a CPAP at night, you may bring your mask/headgear for your overnight stay.   You will be asked to remove any contacts, glasses, piercing's, hearing aid's, dentures/partials prior to surgery. Please bring cases for these items if needed.    Patients discharged the day of surgery will not be allowed to drive home, and someone needs to stay with them for 24 hours.  SURGICAL WAITING ROOM VISITATION Patients may have no more than 2 support people in the waiting area - these visitors may rotate.   Pre-op nurse will coordinate an appropriate time for 1 ADULT support person, who may not rotate, to accompany patient in pre-op.  Children under the age of 1 must have an adult with them who is not the patient and must remain in the main waiting area with an adult.  If the patient needs to stay at the hospital during part of their recovery, the visitor guidelines for inpatient rooms apply.  Please refer to the [facility] website for the visitor guidelines for any additional information.   If you received a COVID test during your pre-op visit  it is requested that you wear a mask when out in public, stay away from anyone that may not be feeling well and notify your surgeon if you develop symptoms. If you have been in contact with anyone that has tested positive in the last 10 days please notify you surgeon.      Pre-operative  5 CHG Bathing Instructions   You can play a key role in reducing the risk of infection after surgery. Your skin needs to be as free of germs as possible. You can reduce the number of germs on your skin by washing with CHG (chlorhexidine gluconate) soap before surgery. CHG is an antiseptic soap that kills germs and continues to kill germs even after washing.   DO NOT use if you have an allergy to chlorhexidine/CHG or antibacterial soaps. If your skin becomes reddened or irritated, stop using the CHG and notify one of our RNs at  [phone].   Please shower with the CHG soap starting 4 days before surgery using the following schedule:     Please keep in mind the following:  DO NOT shave, including legs and underarms, starting the day of your first shower.   You may shave your face at any point before/day of surgery.  Place clean sheets on your bed the day you start using CHG soap. Use a clean washcloth (not used since being washed) for each shower. DO NOT sleep with pets once you start using the CHG.   CHG Shower Instructions:  If you choose to wash your hair and private area, wash first with your normal shampoo/soap.  After you use shampoo/soap, rinse your hair and body thoroughly to remove shampoo/soap residue.  Turn the water OFF and apply about 3 tablespoons (45 ml) of CHG soap to a CLEAN washcloth.  Apply CHG soap ONLY FROM YOUR NECK DOWN TO YOUR TOES (washing for 3-5 minutes)  DO NOT use CHG soap on face, private areas, open wounds, or sores.  Pay special attention to the area where your surgery is being performed.  If you are having back surgery, having someone wash your back for you may be helpful. Wait 2 minutes after CHG soap is applied, then you may rinse off the CHG soap.  Pat dry with a clean towel  Put on clean clothes/pajamas   If you choose to wear lotion, please use ONLY the CHG-compatible lotions on the back of this paper.   Additional instructions for the day of surgery: DO NOT APPLY any lotions, deodorants, cologne, or perfumes.   Do not bring valuables to the hospital. [facility] is not responsible for any belongings/valuables. Do not wear nail polish, gel polish, artificial nails, or any other type of covering on natural nails (fingers and toes) Do not wear jewelry or makeup Put on clean/comfortable clothes.  Please brush your teeth.  Ask your nurse before applying any prescription medications to the skin.     CHG Compatible Lotions   Aveeno Moisturizing lotion  Cetaphil  Moisturizing Cream  Cetaphil Moisturizing Lotion  Clairol Herbal Essence Moisturizing Lotion, Dry Skin  Clairol Herbal Essence Moisturizing Lotion, Extra Dry Skin  Clairol Herbal Essence Moisturizing Lotion, Normal Skin  Curel Age Defying Therapeutic Moisturizing Lotion with Alpha Hydroxy  Curel Extreme Care Body Lotion  Curel Soothing Hands Moisturizing Hand Lotion  Curel Therapeutic Moisturizing Cream, Fragrance-Free  Curel Therapeutic Moisturizing Lotion, Fragrance-Free  Curel Therapeutic Moisturizing Lotion, Original Formula  Eucerin Daily Replenishing Lotion  Eucerin Dry Skin Therapy Plus Alpha Hydroxy Crme  Eucerin Dry Skin Therapy Plus Alpha Hydroxy Lotion  Eucerin Original Crme  Eucerin Original Lotion  Eucerin Plus Crme Eucerin Plus Lotion  Eucerin TriLipid Replenishing Lotion  Keri Anti-Bacterial Hand Lotion  Keri Deep Conditioning Original Lotion Dry Skin Formula Softly Scented  Keri Deep Conditioning Original Lotion, Fragrance Free Sensitive Skin Formula  Keri  Lotion Fast Absorbing Fragrance Free Sensitive Skin Formula  Keri Lotion Fast Absorbing Softly Scented Dry Skin Formula  Keri Original Lotion  Keri Skin Renewal Lotion Keri Silky Smooth Lotion  Keri Silky Smooth Sensitive Skin Lotion  Nivea Body Creamy Conditioning Oil  Nivea Body Extra Enriched Lotion  Nivea Body Original Lotion  Nivea Body Sheer Moisturizing Lotion Nivea Crme  Nivea Skin Firming Lotion  NutraDerm 30 Skin Lotion  NutraDerm Skin Lotion  NutraDerm Therapeutic Skin Cream  NutraDerm Therapeutic Skin Lotion  ProShield Protective Hand Cream  Provon moisturizing lotion  Please read over the following fact sheets that you were given.

## 2023-03-03 ENCOUNTER — Encounter (HOSPITAL_COMMUNITY): Payer: Self-pay

## 2023-03-03 ENCOUNTER — Encounter (HOSPITAL_COMMUNITY)
Admission: RE | Admit: 2023-03-03 | Discharge: 2023-03-03 | Disposition: A | Discharge status: Home / Self Care | Payer: Medicare Other | Source: Ambulatory Visit | Attending: Orthopedic Surgery | Admitting: Orthopedic Surgery

## 2023-03-03 ENCOUNTER — Other Ambulatory Visit: Payer: Self-pay

## 2023-03-03 VITALS — BP 148/93 | HR 80 | Temp 98.0°F | Resp 18 | Ht 70.5 in | Wt 199.5 lb

## 2023-03-03 DIAGNOSIS — Z01818 Encounter for other preprocedural examination: Secondary | ICD-10-CM | POA: Insufficient documentation

## 2023-03-03 DIAGNOSIS — K769 Liver disease, unspecified: Secondary | ICD-10-CM | POA: Insufficient documentation

## 2023-03-03 HISTORY — DX: Prediabetes: R73.03

## 2023-03-03 LAB — COMPREHENSIVE METABOLIC PANEL
ALT: 28 U/L (ref 0–44)
AST: 23 U/L (ref 15–41)
Albumin: 4.1 g/dL (ref 3.5–5.0)
Alkaline Phosphatase: 84 U/L (ref 38–126)
Anion gap: 10 (ref 5–15)
BUN: 13 mg/dL (ref 8–23)
CO2: 26 mmol/L (ref 22–32)
Calcium: 9.7 mg/dL (ref 8.9–10.3)
Chloride: 104 mmol/L (ref 98–111)
Creatinine, Ser: 0.95 mg/dL (ref 0.61–1.24)
GFR, Estimated: >60 mL/min (ref >60)
Glucose, Bld: 119 mg/dL — ABNORMAL HIGH (ref 70–99)
Potassium: 3.9 mmol/L (ref 3.5–5.1)
Sodium: 140 mmol/L (ref 135–145)
Total Bilirubin: 0.9 mg/dL (ref 0.3–1.2)
Total Protein: 7.6 g/dL (ref 6.5–8.1)

## 2023-03-03 LAB — URINALYSIS, ROUTINE W REFLEX MICROSCOPIC
Bacteria, UA: NONE SEEN
Bilirubin Urine: NEGATIVE
Glucose, UA: NEGATIVE mg/dL
Hgb urine dipstick: NEGATIVE
Ketones, ur: NEGATIVE mg/dL
Leukocytes,Ua: NEGATIVE
Nitrite: NEGATIVE
Protein, ur: NEGATIVE mg/dL
Specific Gravity, Urine: 1.009 (ref 1.005–1.030)
pH: 6 (ref 5.0–8.0)

## 2023-03-03 LAB — CBC
HCT: 40.4 % (ref 39.0–52.0)
Hemoglobin: 13.2 g/dL (ref 13.0–17.0)
MCH: 28.1 pg (ref 26.0–34.0)
MCHC: 32.7 g/dL (ref 30.0–36.0)
MCV: 86 fL (ref 80.0–100.0)
Platelets: 203 10*3/uL (ref 150–400)
RBC: 4.7 MIL/uL (ref 4.22–5.81)
RDW: 13.1 % (ref 11.5–15.5)
WBC: 5.6 10*3/uL (ref 4.0–10.5)
nRBC: 0 % (ref 0.0–0.2)

## 2023-03-03 LAB — SURGICAL PCR SCREEN

## 2023-03-03 NOTE — Progress Notes (Signed)
Surgical PCR result invalid. Will need to be recollect DOS. Order placed

## 2023-03-03 NOTE — Progress Notes (Signed)
PCP - Dr. Danise Edge Cardiologist - Denies  PPM/ICD - Denies Device Orders - n/a Rep Notified - n/a  Chest x-ray - Denies EKG - 07/21/2022 Stress Test - Per pt, many years ago. Result normal ECHO - Per pt, many years ago. Result normal Cardiac Cath - Denies  Sleep Study - Denies CPAP - n/a  Pt is Pre-DM  Last dose of GLP1 agonist- n/a GLP1 instructions: n/a  Blood Thinner Instructions: n/a Aspirin Instructions: n/a  ERAS Protcol - Clear liquids until 0430 morning of surgery PRE-SURGERY Ensure or G2- Ensure given to pt with instructions  COVID TEST- n/a   Anesthesia review: No.   Patient denies shortness of breath, fever, cough and chest pain at PAT appointment. Pt denies any respiratory illness/infection in the last two months.   All instructions explained to the patient, with a verbal understanding of the material. Patient agrees to go over the instructions while at home for a better understanding. Patient also instructed to self quarantine after being tested for COVID-19. The opportunity to ask questions was provided.

## 2023-03-09 ENCOUNTER — Telehealth: Payer: Self-pay | Admitting: *Deleted

## 2023-03-09 NOTE — Telephone Encounter (Signed)
Okay for 1 week as long as he uses it at least 3 to 4 hours a day.  Thanks

## 2023-03-09 NOTE — Care Plan (Addendum)
[facility] RNCM call to patient prior to surgery for Right Reverse Shoulder arthroplasty with Dr. August Saucer on 03/12/23. Patient is an Ortho bundle and agreeable to case management. He lives with his spouse, who will be assisting after surgery. He will be receiving his home sling through [employer] tomorrow and asked if he should pay for the CPM out of pocket since insurance won't cover for the first couple of weeks. Cost for full use is too much for him right now. Also, he asked about HHPT for first 2 weeks. Will check with MD about this as well. Reviewed post op instructions. Will continue to follow for needs.SANE (single assessment numerical evaluation) is 40% per patient.

## 2023-03-09 NOTE — Telephone Encounter (Signed)
Discussed with patient

## 2023-03-09 NOTE — Telephone Encounter (Signed)
Patient asked about HHPT for after his Right Reverse Shoulder replacement this week. He had HHPT last time. Also had home CPM (chair), but only wanted to pay for the first week depending on how you felt about it. It you really thought he needed HHPT and the chair, he would purchase for a week or two and I would order. He asked if you really thought it would make a difference in his recovery or not. Recommendations?

## 2023-03-11 NOTE — Anesthesia Preprocedure Evaluation (Signed)
Anesthesia Evaluation  Patient identified by MRN, date of birth, ID band Patient awake    Reviewed: Allergy & Precautions, NPO status , Patient's Chart, lab work & pertinent test results, reviewed documented beta blocker date and time   History of Anesthesia Complications Negative for: history of anesthetic complications  Airway Mallampati: II  TM Distance: >3 FB Neck ROM: Full    Dental no notable dental hx.    Pulmonary neg pulmonary ROS   Pulmonary exam normal        Cardiovascular hypertension, Pt. on medications and Pt. on home beta blockers Normal cardiovascular exam     Neuro/Psych   Anxiety        GI/Hepatic negative GI ROS, Neg liver ROS,,,  Endo/Other  negative endocrine ROS    Renal/GU      Musculoskeletal  (+) Arthritis ,  right shoulder arthritits, biceps tendinitis   Abdominal   Peds  Hematology negative hematology ROS (+)   Anesthesia Other Findings Day of surgery medications reviewed with patient.  Reproductive/Obstetrics                              Anesthesia Physical Anesthesia Plan  ASA: 2  Anesthesia Plan: General   Post-op Pain Management: Tylenol PO (pre-op)* and Regional block*   Induction: Intravenous  PONV Risk Score and Plan: 2 and Ondansetron, Dexamethasone and Treatment may vary due to age or medical condition  Airway Management Planned: Oral ETT  Additional Equipment: None  Intra-op Plan:   Post-operative Plan: Extubation in OR  Informed Consent: I have reviewed the patients History and Physical, chart, labs and discussed the procedure including the risks, benefits and alternatives for the proposed anesthesia with the patient or authorized representative who has indicated his/her understanding and acceptance.     Dental advisory given  Plan Discussed with: CRNA  Anesthesia Plan Comments:         Anesthesia Quick Evaluation

## 2023-03-12 ENCOUNTER — Observation Stay (HOSPITAL_COMMUNITY): Payer: Medicare Other

## 2023-03-12 ENCOUNTER — Encounter (HOSPITAL_COMMUNITY)
Admission: RE | Disposition: A | Discharge status: Home / Self Care | Payer: Self-pay | Source: Home / Self Care | Attending: Orthopedic Surgery

## 2023-03-12 ENCOUNTER — Observation Stay (HOSPITAL_COMMUNITY)
Admission: RE | Admit: 2023-03-12 | Discharge: 2023-03-13 | Disposition: A | Discharge status: Home / Self Care | Payer: Medicare Other | Attending: Orthopedic Surgery | Admitting: Orthopedic Surgery

## 2023-03-12 ENCOUNTER — Ambulatory Visit (HOSPITAL_COMMUNITY): Payer: Self-pay | Admitting: Anesthesiology

## 2023-03-12 ENCOUNTER — Other Ambulatory Visit: Payer: Self-pay

## 2023-03-12 ENCOUNTER — Encounter (HOSPITAL_COMMUNITY): Payer: Self-pay | Admitting: Orthopedic Surgery

## 2023-03-12 DIAGNOSIS — Z96651 Presence of right artificial knee joint: Secondary | ICD-10-CM | POA: Insufficient documentation

## 2023-03-12 DIAGNOSIS — M19011 Primary osteoarthritis, right shoulder: Secondary | ICD-10-CM | POA: Diagnosis not present

## 2023-03-12 DIAGNOSIS — M75101 Unspecified rotator cuff tear or rupture of right shoulder, not specified as traumatic: Secondary | ICD-10-CM | POA: Diagnosis not present

## 2023-03-12 DIAGNOSIS — M7521 Bicipital tendinitis, right shoulder: Secondary | ICD-10-CM | POA: Diagnosis not present

## 2023-03-12 DIAGNOSIS — Z96612 Presence of left artificial shoulder joint: Secondary | ICD-10-CM | POA: Diagnosis not present

## 2023-03-12 DIAGNOSIS — Z79899 Other long term (current) drug therapy: Secondary | ICD-10-CM | POA: Diagnosis not present

## 2023-03-12 DIAGNOSIS — Z01818 Encounter for other preprocedural examination: Principal | ICD-10-CM

## 2023-03-12 DIAGNOSIS — I10 Essential (primary) hypertension: Secondary | ICD-10-CM | POA: Insufficient documentation

## 2023-03-12 DIAGNOSIS — M25811 Other specified joint disorders, right shoulder: Secondary | ICD-10-CM | POA: Diagnosis not present

## 2023-03-12 DIAGNOSIS — Z96611 Presence of right artificial shoulder joint: Secondary | ICD-10-CM

## 2023-03-12 DIAGNOSIS — S4290XA Fracture of unspecified shoulder girdle, part unspecified, initial encounter for closed fracture: Secondary | ICD-10-CM | POA: Diagnosis present

## 2023-03-12 DIAGNOSIS — G8918 Other acute postprocedural pain: Secondary | ICD-10-CM | POA: Diagnosis not present

## 2023-03-12 HISTORY — PX: REVERSE SHOULDER ARTHROPLASTY: SHX5054

## 2023-03-12 HISTORY — PX: BICEPT TENODESIS: SHX5116

## 2023-03-12 LAB — SURGICAL PCR SCREEN
MRSA, PCR: NEGATIVE
Staphylococcus aureus: NEGATIVE

## 2023-03-12 SURGERY — ARTHROPLASTY, SHOULDER, TOTAL, REVERSE
Anesthesia: General | Site: Shoulder | Laterality: Right

## 2023-03-12 MED ORDER — ACETAMINOPHEN 500 MG PO TABS: 1000.0000 mg | ORAL_TABLET | Freq: Once | ORAL | Status: AC

## 2023-03-12 MED ORDER — OXYCODONE HCL 5 MG PO TABS
5.0000 mg | ORAL_TABLET | ORAL | Status: DC | PRN
  Administered 2023-03-13 (×2): 5 mg via ORAL
  Filled 2023-03-12 (×2): qty 1

## 2023-03-12 MED ORDER — ALPRAZOLAM 0.5 MG PO TABS
0.5000 mg | ORAL_TABLET | Freq: Every evening | ORAL | Status: DC | PRN
  Administered 2023-03-13: 0.5 mg via ORAL
  Filled 2023-03-12: qty 1

## 2023-03-12 MED ORDER — SODIUM CHLORIDE 0.9 % IV SOLN: INTRAVENOUS | Status: AC

## 2023-03-12 MED ORDER — LACTATED RINGERS IV SOLN: INTRAVENOUS | Status: DC

## 2023-03-12 MED ORDER — ACETAMINOPHEN 325 MG PO TABS: 325.0000 mg | ORAL_TABLET | Freq: Four times a day (QID) | ORAL | Status: DC | PRN

## 2023-03-12 MED ORDER — CEFAZOLIN SODIUM-DEXTROSE 2-4 GM/100ML-% IV SOLN
2.0000 g | Freq: Three times a day (TID) | INTRAVENOUS | Status: AC
  Administered 2023-03-12 – 2023-03-13 (×3): 2 g via INTRAVENOUS
  Filled 2023-03-12 (×3): qty 100

## 2023-03-12 MED ORDER — LOSARTAN POTASSIUM 50 MG PO TABS
50.0000 mg | ORAL_TABLET | Freq: Every day | ORAL | Status: DC
  Administered 2023-03-13: 50 mg via ORAL
  Filled 2023-03-12: qty 1

## 2023-03-12 MED ORDER — VANCOMYCIN HCL 1000 MG IV SOLR
INTRAVENOUS | Status: DC | PRN
  Administered 2023-03-12: 1000 mg via TOPICAL

## 2023-03-12 MED ORDER — POVIDONE-IODINE 7.5 % EX SOLN
Freq: Once | CUTANEOUS | Status: DC
  Filled 2023-03-12: qty 118

## 2023-03-12 MED ORDER — FENTANYL CITRATE (PF) 100 MCG/2ML IJ SOLN
INTRAMUSCULAR | Status: AC
  Filled 2023-03-12: qty 2

## 2023-03-12 MED ORDER — LIDOCAINE 2% (20 MG/ML) 5 ML SYRINGE
INTRAMUSCULAR | Status: DC | PRN
  Administered 2023-03-12: 100 mg via INTRAVENOUS

## 2023-03-12 MED ORDER — CEFAZOLIN SODIUM-DEXTROSE 2-4 GM/100ML-% IV SOLN
2.0000 g | INTRAVENOUS | Status: AC
  Administered 2023-03-12: 2 g via INTRAVENOUS

## 2023-03-12 MED ORDER — METOPROLOL SUCCINATE ER 50 MG PO TB24
50.0000 mg | ORAL_TABLET | Freq: Every day | ORAL | Status: DC
  Administered 2023-03-13: 50 mg via ORAL
  Filled 2023-03-12: qty 1

## 2023-03-12 MED ORDER — FENTANYL CITRATE (PF) 250 MCG/5ML IJ SOLN
INTRAMUSCULAR | Status: AC
  Filled 2023-03-12: qty 5

## 2023-03-12 MED ORDER — PROPOFOL 10 MG/ML IV BOLUS
INTRAVENOUS | Status: AC
  Filled 2023-03-12: qty 20

## 2023-03-12 MED ORDER — CHLORHEXIDINE GLUCONATE 0.12 % MT SOLN
OROMUCOSAL | Status: AC
  Administered 2023-03-12: 15 mL via OROMUCOSAL
  Filled 2023-03-12: qty 15

## 2023-03-12 MED ORDER — BUPIVACAINE LIPOSOME 1.3 % IJ SUSP
INTRAMUSCULAR | Status: AC
  Filled 2023-03-12: qty 10

## 2023-03-12 MED ORDER — OXYCODONE HCL 5 MG/5ML PO SOLN: 5.0000 mg | Freq: Once | ORAL | Status: DC | PRN

## 2023-03-12 MED ORDER — FENTANYL CITRATE (PF) 100 MCG/2ML IJ SOLN: 50.0000 ug | Freq: Once | INTRAMUSCULAR | Status: AC

## 2023-03-12 MED ORDER — IRRISEPT - 450ML BOTTLE WITH 0.05% CHG IN STERILE WATER, USP 99.95% OPTIME
TOPICAL | Status: DC | PRN
  Administered 2023-03-12: 450 mL via TOPICAL

## 2023-03-12 MED ORDER — TRANEXAMIC ACID-NACL 1000-0.7 MG/100ML-% IV SOLN: 1000.0000 mg | INTRAVENOUS | Status: DC

## 2023-03-12 MED ORDER — DOCUSATE SODIUM 100 MG PO CAPS
100.0000 mg | ORAL_CAPSULE | Freq: Two times a day (BID) | ORAL | Status: DC
  Administered 2023-03-12 – 2023-03-13 (×2): 100 mg via ORAL
  Filled 2023-03-12 (×2): qty 1

## 2023-03-12 MED ORDER — MIDAZOLAM HCL 2 MG/2ML IJ SOLN
INTRAMUSCULAR | Status: AC
  Filled 2023-03-12: qty 2

## 2023-03-12 MED ORDER — METOCLOPRAMIDE HCL 5 MG/ML IJ SOLN: 5.0000 mg | Freq: Three times a day (TID) | INTRAMUSCULAR | Status: DC | PRN

## 2023-03-12 MED ORDER — MELOXICAM 7.5 MG PO TABS
15.0000 mg | ORAL_TABLET | Freq: Every day | ORAL | Status: DC
  Administered 2023-03-13: 15 mg via ORAL
  Filled 2023-03-12 (×2): qty 2

## 2023-03-12 MED ORDER — ACETAMINOPHEN 500 MG PO TABS
ORAL_TABLET | ORAL | Status: AC
  Administered 2023-03-12: 1000 mg via ORAL
  Filled 2023-03-12: qty 2

## 2023-03-12 MED ORDER — METOCLOPRAMIDE HCL 5 MG PO TABS: 5.0000 mg | ORAL_TABLET | Freq: Three times a day (TID) | ORAL | Status: DC | PRN

## 2023-03-12 MED ORDER — ONDANSETRON HCL 4 MG/2ML IJ SOLN
INTRAMUSCULAR | Status: DC | PRN
  Administered 2023-03-12: 4 mg via INTRAVENOUS

## 2023-03-12 MED ORDER — BUPIVACAINE-EPINEPHRINE (PF) 0.5% -1:200000 IJ SOLN
INTRAMUSCULAR | Status: DC | PRN
Start: 2023-03-12 — End: 2023-03-12
  Administered 2023-03-12: 15 mL via PERINEURAL

## 2023-03-12 MED ORDER — PHENYLEPHRINE HCL-NACL 20-0.9 MG/250ML-% IV SOLN: INTRAVENOUS | Status: DC | PRN

## 2023-03-12 MED ORDER — AMLODIPINE BESYLATE 10 MG PO TABS
10.0000 mg | ORAL_TABLET | Freq: Every day | ORAL | Status: DC
  Administered 2023-03-13: 10 mg via ORAL
  Filled 2023-03-12: qty 1

## 2023-03-12 MED ORDER — STERILE WATER FOR IRRIGATION IR SOLN
Status: DC | PRN
Start: 2023-03-12 — End: 2023-03-12
  Administered 2023-03-12: 1000 mL

## 2023-03-12 MED ORDER — DEXAMETHASONE SODIUM PHOSPHATE 10 MG/ML IJ SOLN
INTRAMUSCULAR | Status: DC | PRN
  Administered 2023-03-12: 5 mg via INTRAVENOUS

## 2023-03-12 MED ORDER — ASPIRIN 81 MG PO TBEC
81.0000 mg | DELAYED_RELEASE_TABLET | Freq: Every day | ORAL | Status: DC
  Administered 2023-03-12 – 2023-03-13 (×2): 81 mg via ORAL
  Filled 2023-03-12 (×2): qty 1

## 2023-03-12 MED ORDER — 0.9 % SODIUM CHLORIDE (POUR BTL) OPTIME
TOPICAL | Status: DC | PRN
  Administered 2023-03-12 (×5): 1000 mL

## 2023-03-12 MED ORDER — PHENOL 1.4 % MT LIQD: 1.0000 | OROMUCOSAL | Status: DC | PRN

## 2023-03-12 MED ORDER — BUPIVACAINE LIPOSOME 1.3 % IJ SUSP
INTRAMUSCULAR | Status: DC | PRN
Start: 2023-03-12 — End: 2023-03-12
  Administered 2023-03-12: 10 mL via PERINEURAL

## 2023-03-12 MED ORDER — ONDANSETRON HCL 4 MG/2ML IJ SOLN: 4.0000 mg | Freq: Four times a day (QID) | INTRAMUSCULAR | Status: DC | PRN

## 2023-03-12 MED ORDER — PHENYLEPHRINE 80 MCG/ML (10ML) SYRINGE FOR IV PUSH (FOR BLOOD PRESSURE SUPPORT)
PREFILLED_SYRINGE | INTRAVENOUS | Status: DC | PRN
  Administered 2023-03-12: 80 ug via INTRAVENOUS
  Administered 2023-03-12: 160 ug via INTRAVENOUS

## 2023-03-12 MED ORDER — PROPOFOL 10 MG/ML IV BOLUS
INTRAVENOUS | Status: DC | PRN
  Administered 2023-03-12: 150 mg via INTRAVENOUS

## 2023-03-12 MED ORDER — METHOCARBAMOL 500 MG PO TABS
500.0000 mg | ORAL_TABLET | Freq: Four times a day (QID) | ORAL | Status: DC | PRN
  Administered 2023-03-13: 500 mg via ORAL
  Filled 2023-03-12: qty 1

## 2023-03-12 MED ORDER — ROCURONIUM BROMIDE 10 MG/ML (PF) SYRINGE
PREFILLED_SYRINGE | INTRAVENOUS | Status: DC | PRN
  Administered 2023-03-12: 10 mg via INTRAVENOUS
  Administered 2023-03-12: 60 mg via INTRAVENOUS

## 2023-03-12 MED ORDER — POVIDONE-IODINE 10 % EX SWAB: 2.0000 | Freq: Once | CUTANEOUS | Status: DC

## 2023-03-12 MED ORDER — SUGAMMADEX SODIUM 200 MG/2ML IV SOLN
INTRAVENOUS | Status: DC | PRN
  Administered 2023-03-12: 100 mg via INTRAVENOUS

## 2023-03-12 MED ORDER — LACTATED RINGERS IV SOLN: INTRAVENOUS | Status: DC | PRN

## 2023-03-12 MED ORDER — MIDAZOLAM HCL 2 MG/2ML IJ SOLN: 1.0000 mg | Freq: Once | INTRAMUSCULAR | Status: AC

## 2023-03-12 MED ORDER — MENTHOL 3 MG MT LOZG: 1.0000 | LOZENGE | OROMUCOSAL | Status: DC | PRN

## 2023-03-12 MED ORDER — ONDANSETRON HCL 4 MG/2ML IJ SOLN
INTRAMUSCULAR | Status: AC
  Filled 2023-03-12: qty 2

## 2023-03-12 MED ORDER — HYDROMORPHONE HCL 1 MG/ML IJ SOLN
0.5000 mg | INTRAMUSCULAR | Status: DC | PRN
  Administered 2023-03-12 – 2023-03-13 (×4): 0.5 mg via INTRAVENOUS
  Filled 2023-03-12 (×4): qty 0.5

## 2023-03-12 MED ORDER — FENTANYL CITRATE (PF) 100 MCG/2ML IJ SOLN
INTRAMUSCULAR | Status: AC
  Administered 2023-03-12: 50 ug
  Filled 2023-03-12: qty 2

## 2023-03-12 MED ORDER — CEFAZOLIN SODIUM-DEXTROSE 2-4 GM/100ML-% IV SOLN
INTRAVENOUS | Status: AC
  Filled 2023-03-12: qty 100

## 2023-03-12 MED ORDER — METHOCARBAMOL 1000 MG/10ML IJ SOLN: 500.0000 mg | Freq: Four times a day (QID) | INTRAVENOUS | Status: DC | PRN

## 2023-03-12 MED ORDER — ACETAMINOPHEN 500 MG PO TABS
1000.0000 mg | ORAL_TABLET | Freq: Four times a day (QID) | ORAL | Status: AC
  Administered 2023-03-12 – 2023-03-13 (×4): 1000 mg via ORAL
  Filled 2023-03-12 (×4): qty 2

## 2023-03-12 MED ORDER — VANCOMYCIN HCL 1000 MG IV SOLR
INTRAVENOUS | Status: AC
  Filled 2023-03-12: qty 20

## 2023-03-12 MED ORDER — PHENYLEPHRINE HCL-NACL 20-0.9 MG/250ML-% IV SOLN
INTRAVENOUS | Status: DC | PRN
  Administered 2023-03-12: 30 ug/min via INTRAVENOUS

## 2023-03-12 MED ORDER — MIDAZOLAM HCL 2 MG/2ML IJ SOLN
INTRAMUSCULAR | Status: AC
  Administered 2023-03-12: 1 mg via INTRAVENOUS
  Filled 2023-03-12: qty 2

## 2023-03-12 MED ORDER — TRANEXAMIC ACID-NACL 1000-0.7 MG/100ML-% IV SOLN
INTRAVENOUS | Status: AC
  Filled 2023-03-12: qty 100

## 2023-03-12 MED ORDER — OXYCODONE HCL 5 MG PO TABS: 5.0000 mg | ORAL_TABLET | Freq: Once | ORAL | Status: DC | PRN

## 2023-03-12 MED ORDER — TAMSULOSIN HCL 0.4 MG PO CAPS
0.4000 mg | ORAL_CAPSULE | Freq: Every day | ORAL | Status: DC
  Administered 2023-03-12 – 2023-03-13 (×2): 0.4 mg via ORAL
  Filled 2023-03-12 (×2): qty 1

## 2023-03-12 MED ORDER — ONDANSETRON HCL 4 MG PO TABS: 4.0000 mg | ORAL_TABLET | Freq: Four times a day (QID) | ORAL | Status: DC | PRN

## 2023-03-12 MED ORDER — CHLORHEXIDINE GLUCONATE 0.12 % MT SOLN
15.0000 mL | OROMUCOSAL | Status: AC
  Filled 2023-03-12: qty 15

## 2023-03-12 MED ORDER — FENTANYL CITRATE (PF) 100 MCG/2ML IJ SOLN
25.0000 ug | INTRAMUSCULAR | Status: DC | PRN
  Administered 2023-03-12: 50 ug via INTRAVENOUS

## 2023-03-12 MED ORDER — DROPERIDOL 2.5 MG/ML IJ SOLN: 0.6250 mg | Freq: Once | INTRAMUSCULAR | Status: DC | PRN

## 2023-03-12 SURGICAL SUPPLY — 80 items
AID PSTN UNV HD RSTRNT DISP (MISCELLANEOUS) ×1
ALCOHOL 70% 16 OZ (MISCELLANEOUS) ×1 IMPLANT
APL PRP STRL LF DISP 70% ISPRP (MISCELLANEOUS)
AUG COMP REV MI TAPER ADAPTER (Joint) ×1 IMPLANT
AUGMENT COMP REV MI TAPR ADPTR (Joint) IMPLANT
BAG COUNTER SPONGE SURGICOUNT (BAG) ×1 IMPLANT
BAG SPNG CNTER NS LX DISP (BAG) ×1
BEARING HUMERAL SHLDER 36M STD (Shoulder) IMPLANT
BIT DRILL 2.7 W/STOP DISP (BIT) IMPLANT
BIT DRILL QUICK REL 1/8 2PK SL (BIT) IMPLANT
BIT DRILL TWIST 2.7 (BIT) IMPLANT
BLADE SAW SGTL 13X75X1.27 (BLADE) ×1 IMPLANT
BRNG HUM STD 36 RVRS SHLDR (Shoulder) ×1 IMPLANT
BSPLAT GLND SM AUG TPR ADPR (Joint) ×1 IMPLANT
CHLORAPREP W/TINT 26 (MISCELLANEOUS) ×1 IMPLANT
COOLER ICEMAN CLASSIC (MISCELLANEOUS) ×1 IMPLANT
COVER SURGICAL LIGHT HANDLE (MISCELLANEOUS) ×1 IMPLANT
DRAPE INCISE IOBAN 66X45 STRL (DRAPES) ×1 IMPLANT
DRAPE U-SHAPE 47X51 STRL (DRAPES) ×2 IMPLANT
DRSG AQUACEL AG ADV 3.5X10 (GAUZE/BANDAGES/DRESSINGS) ×1 IMPLANT
ELECT BLADE 4.0 EZ CLEAN MEGAD (MISCELLANEOUS) ×1
ELECT REM PT RETURN 9FT ADLT (ELECTROSURGICAL) ×1
ELECTRODE BLDE 4.0 EZ CLN MEGD (MISCELLANEOUS) ×1 IMPLANT
ELECTRODE REM PT RTRN 9FT ADLT (ELECTROSURGICAL) ×1 IMPLANT
GAUZE SPONGE 4X4 12PLY STRL LF (GAUZE/BANDAGES/DRESSINGS) ×1 IMPLANT
GLENOID SPHERE 36+6 (Joint) IMPLANT
GLOVE BIOGEL PI IND STRL 6.5 (GLOVE) ×1 IMPLANT
GLOVE BIOGEL PI IND STRL 8 (GLOVE) ×1 IMPLANT
GLOVE ECLIPSE 6.5 STRL STRAW (GLOVE) ×1 IMPLANT
GLOVE ECLIPSE 8.0 STRL XLNG CF (GLOVE) ×1 IMPLANT
GOWN STRL REUS W/ TWL LRG LVL3 (GOWN DISPOSABLE) ×1 IMPLANT
GOWN STRL REUS W/ TWL XL LVL3 (GOWN DISPOSABLE) ×1 IMPLANT
GOWN STRL REUS W/TWL LRG LVL3 (GOWN DISPOSABLE) ×1
GOWN STRL REUS W/TWL XL LVL3 (GOWN DISPOSABLE) ×1
GUIDE BONE RSA SHLD ROT RT (ORTHOPEDIC DISPOSABLE SUPPLIES) IMPLANT
GUIDE PROV SHLD SM AUG DISP (ORTHOPEDIC DISPOSABLE SUPPLIES) IMPLANT
GUIDE TRIAL ADPT GLEN 3.5 DISP (ORTHOPEDIC DISPOSABLE SUPPLIES) IMPLANT
HYDROGEN PEROXIDE 16OZ (MISCELLANEOUS) ×1 IMPLANT
JET LAVAGE IRRISEPT WOUND (IRRIGATION / IRRIGATOR) ×1
KIT BASIN OR (CUSTOM PROCEDURE TRAY) ×1 IMPLANT
KIT TURNOVER KIT B (KITS) ×1 IMPLANT
LAVAGE JET IRRISEPT WOUND (IRRIGATION / IRRIGATOR) ×1 IMPLANT
MANIFOLD NEPTUNE II (INSTRUMENTS) ×1 IMPLANT
NDL SUT 6 .5 CRC .975X.05 MAYO (NEEDLE) IMPLANT
NEEDLE MAYO TAPER (NEEDLE) ×1
NS IRRIG 1000ML POUR BTL (IV SOLUTION) ×1 IMPLANT
PACK SHOULDER (CUSTOM PROCEDURE TRAY) ×1 IMPLANT
PAD COLD SHLDR WRAP-ON (PAD) ×1 IMPLANT
PIN HUMERAL STMN 3.2MMX9IN (INSTRUMENTS) IMPLANT
PIN THREADED REVERSE (PIN) IMPLANT
REAMER GUIDE BUSHING SURG DISP (MISCELLANEOUS) IMPLANT
REAMER GUIDE W/SCREW AUG (MISCELLANEOUS) IMPLANT
RESTRAINT HEAD UNIVERSAL NS (MISCELLANEOUS) ×1 IMPLANT
RETRIEVER SUT HEWSON (MISCELLANEOUS) ×1 IMPLANT
SCREW BONE CORT 6.5X35MM (Screw) IMPLANT
SCREW LOCKING 4.75MMX15MM (Screw) IMPLANT
SCREW LOCKING STRL 4.75X25X3.5 (Screw) IMPLANT
SHOULDER HUMERAL BEAR 36M STD (Shoulder) ×1 IMPLANT
SLING ARM IMMOBILIZER LRG (SOFTGOODS) ×1 IMPLANT
SOL PREP POV-IOD 4OZ 10% (MISCELLANEOUS) ×1 IMPLANT
SPONGE T-LAP 18X18 ~~LOC~~+RFID (SPONGE) ×1 IMPLANT
STEM HUMERAL STRL 13MMX83MM (Stem) IMPLANT
STRIP CLOSURE SKIN 1/2X4 (GAUZE/BANDAGES/DRESSINGS) ×1 IMPLANT
STRIP CLOSURE SKIN 1/4X4 (GAUZE/BANDAGES/DRESSINGS) IMPLANT
SUCTION TUBE FRAZIER 10FR DISP (SUCTIONS) ×1 IMPLANT
SUT BROADBAND TAPE 2PK 1.5 (SUTURE) IMPLANT
SUT MNCRL AB 3-0 PS2 18 (SUTURE) ×1 IMPLANT
SUT SILK 2 0 TIES 10X30 (SUTURE) ×1 IMPLANT
SUT VIC AB 0 CT1 27 (SUTURE) ×4
SUT VIC AB 0 CT1 27XBRD ANBCTR (SUTURE) ×4 IMPLANT
SUT VIC AB 1 CT1 27 (SUTURE) ×3
SUT VIC AB 1 CT1 27XBRD ANBCTR (SUTURE) ×2 IMPLANT
SUT VIC AB 1 CT1 36 (SUTURE) IMPLANT
SUT VIC AB 2-0 CT1 27 (SUTURE) ×3
SUT VIC AB 2-0 CT1 TAPERPNT 27 (SUTURE) ×3 IMPLANT
SUT VIC AB 2-0 CT2 27 (SUTURE) IMPLANT
SUT VICRYL 0 UR6 27IN ABS (SUTURE) ×2 IMPLANT
TOWEL GREEN STERILE (TOWEL DISPOSABLE) ×1 IMPLANT
TRAY HUM REV SHOULDER STD +6 (Shoulder) IMPLANT
WATER STERILE IRR 1000ML POUR (IV SOLUTION) ×1 IMPLANT

## 2023-03-12 NOTE — Anesthesia Procedure Notes (Signed)
Procedure Name: Intubation Date/Time: 03/12/2023 8:30 AM  Performed by: Aris Georgia, CRNAPre-anesthesia Checklist: Patient identified and Suction available Patient Re-evaluated:Patient Re-evaluated prior to induction Oxygen Delivery Method: Circle system utilized Preoxygenation: Pre-oxygenation with 100% oxygen Induction Type: IV induction Ventilation: Mask ventilation without difficulty Laryngoscope Size: Miller and 2 Grade View: Grade II Tube type: Oral Tube size: 8.5 mm Number of attempts: 2

## 2023-03-12 NOTE — Anesthesia Procedure Notes (Signed)
Anesthesia Regional Block: Interscalene brachial plexus block   Pre-Anesthetic Checklist: , timeout performed,  Correct Patient, Correct Site, Correct Laterality,  Correct Procedure, Correct Position, site marked,  Risks and benefits discussed,  Pre-op evaluation,  At surgeon's request and post-op pain management  Laterality: Right  Prep: Maximum Sterile Barrier Precautions used, chloraprep       Needles:  Injection technique: Single-shot  Needle Type: Echogenic Stimulator Needle     Needle Length: 4cm  Needle Gauge: 22     Additional Needles:   Procedures:,,,, ultrasound used (permanent image in chart),,    Narrative:  Start time: 03/12/2023 7:55 AM End time: 03/12/2023 7:58 AM Injection made incrementally with aspirations every 5 mL.  Performed by: Personally  Anesthesiologist: Kaylyn Layer, MD  Additional Notes: Risks, benefits, and alternative discussed. Patient gave consent for procedure. Patient prepped and draped in sterile fashion. Sedation administered, patient remains easily responsive to voice. Relevant anatomy identified with ultrasound guidance. Local anesthetic given in 5cc increments with no signs or symptoms of intravascular injection. No pain or paraesthesias with injection. Patient monitored throughout procedure with signs of LAST or immediate complications. Tolerated well. Ultrasound image placed in chart.  Charles Greenhouse, MD

## 2023-03-12 NOTE — Anesthesia Procedure Notes (Signed)
Anesthesia Regional Block: Supraclavicular block  Laterality: Right          Narrative:

## 2023-03-12 NOTE — Anesthesia Procedure Notes (Signed)
Procedure Name: Intubation Date/Time: 03/12/2023 8:32 AM  Performed by: Kaylyn Layer, MDPre-anesthesia Checklist: Patient identified, Emergency Drugs available, Suction available and Patient being monitored Patient Re-evaluated:Patient Re-evaluated prior to induction Oxygen Delivery Method: Circle System Utilized Preoxygenation: Pre-oxygenation with 100% oxygen Induction Type: IV induction Ventilation: Mask ventilation without difficulty and Oral airway inserted - appropriate to patient size Laryngoscope Size: Hyacinth Meeker and 2 Grade View: Grade II Tube type: Oral Tube size: 8.0 mm Number of attempts: 2 Airway Equipment and Method: Stylet and Oral airway Placement Confirmation: ETT inserted through vocal cords under direct vision, positive ETCO2 and breath sounds checked- equal and bilateral Secured at: 22 cm Tube secured with: Tape Dental Injury: Teeth and Oropharynx as per pre-operative assessment  Comments: First attempt by CRNA with esophageal intubation, immediately recognized and ETT removed. Next attempt by myself with [facility] 2 blade, 2A view. Atraumatic intubation. Stephannie Peters, MD

## 2023-03-12 NOTE — Brief Op Note (Signed)
   03/12/2023  11:39 AM  PATIENT:  [name]  77 y.o. male  PRE-OPERATIVE DIAGNOSIS:  right shoulder arthritits, biceps tendinitis  POST-OPERATIVE DIAGNOSIS:  right shoulder arthritits, biceps tendinitis  PROCEDURE:  Procedure(s): REVERSE SHOULDER ARTHROPLASTY BICEPS TENODESIS  SURGEON:  Surgeon(s): Cammy Copa, MD  ASSISTANT: Magnant PA  ANESTHESIA:   General  EBL: 125 ml    Total I/O In: 1100 [I.V.:1000; IV Piggyback:100] Out: 100 [Blood:100]  BLOOD ADMINISTERED: none  DRAINS: none   LOCAL MEDICATIONS USED: Vancomycin powder  SPECIMEN:  No Specimen  COUNTS:  YES  TOURNIQUET:  * No tourniquets in log *  DICTATION: .Other Dictation: Dictation Number doneno number given  PLAN OF CARE: Admit for overnight observation  PATIENT DISPOSITION:  PACU - hemodynamically stable

## 2023-03-12 NOTE — Op Note (Signed)
NAMEKENDAN, YOKOYAMA MEDICAL RECORD NO: [id number] ACCOUNT NO: [id number] DATE OF BIRTH: 1946/06/21 FACILITY: [facility] LOCATION: [facility] PHYSICIAN: Graylin Shiver. August Saucer, MD  Operative Report   PREOPERATIVE DIAGNOSES:  Right shoulder arthritis and rotator cuff tendinitis and biceps tendinitis.  POSTOPERATIVE DIAGNOSES:  Right shoulder arthritis and rotator cuff tendinitis and biceps tendinitis.  PROCEDURE:  Right reverse shoulder replacement using small augmented baseplate with size 13 mini humeral stem, +6 taper offset, mini humeral tray and standard 36 mm bearing with 36+6 offset glenosphere and also biceps tenodesis his second part of the  procedure.  SURGEON:  Graylin Shiver. August Saucer, MD  ASSISTANT:  Karenann Cai.  INDICATIONS:  The patient is a 77 year old patient with severe end-stage right shoulder arthritis, who presents for operative management after explanation of risks and benefits.  PROCEDURE IN DETAIL:  The patient was brought to the operating room where general anesthetic was induced.  Preoperative antibiotics administered.  Timeout was called.  Right shoulder prescrubbed with hydrogen peroxide followed by alcohol and Betadine,  which was allowed to air dry, then prepped with ChloraPrep solution and draped in sterile manner.  Ioban used to seal and cover the operative field.  Timeout was called.  Deltopectoral approach was made on the right shoulder.  Skin and subcutaneous  tissue sharply divided.  Cephalic vein mobilized laterally.  Crossing veins were suture ligated.  A deltoid manually elevated off its anterior attachment with the arm in abduction.  Subdeltoid and subacromial adhesions were released.  The upper 1.5 cm of  the pec was released.  Biceps tendon was then tenodesed to the remaining pec tendon using #1 Vicryl sutures x5.  Stump of the biceps tendon was then taken proximally with opening up of the transverse humeral ligament.  Rotator interval was opened up to  the base of the  coracoid.  Circumflex vessels anteriorly were ligated at this time using silk sutures.  Next, the rotator cuff was inspected.  Supraspinatus tendinosis was present.  The subscap was then detached from the lesser tuberosity using a 15  blade.  Further dissection was performed along the inferior 2 cm of the humeral neck and taken around using a Cobb elevator to the 5 o'clock position.  Osteophytes were removed.  At this time, the shoulder was dislocated and [facility] retractor placed.  It  should be noted that the axillary nerve was palpated prior to the dislocation and was protected at all times during the case.  Next, proximal reaming was performed up to a size 13.  Head was cut in 30 degrees of retroversion just above the lesser  tuberosity, which was approximately amount on the patient's native version.  Broaching then performed up to size 13 and a cap was placed.  Posterior retractor was then placed.  The biceps tendon and labrum was then excised with the patient reversed.   Care was taken to avoid injury to the axillary nerve.  Bankart lesion created from the 12 o'clock to 6 o'clock position on the right shoulder.  The patient-specific guide was placed and the 2 pins were placed.  Reaming was then performed in accordance  with preoperative templating and instrumentation to about 5 degrees of inferior tilt.  Reaming for the augment was also performed and we obtained 100% contact with the trial.  The bone quality was good.  It should be noted also that IrriSept solution was  used after the incision at the arthrotomy multiple times during the case.  We then placed into the glenoid baseplate on  the prepared glenoid face with very good purchase obtained with the central compression screw, 4 peripheral locking screws were then  placed.  Next, trial reduction was performed with the +3 and +6 offset glenosphere matched with the +6 offset humerus with standard bearing.  The +6 with standard bearing and +6 offset on  the both humerus and glenoid and the glenosphere gave optimal  stability without undue distalization and lateralization.  Subscap matched nicely to the lesser tuberosity with this construct.  Trial components were removed.  The glenosphere was placed with good coverage of the glenosphere.  Six SutureTapes were  placed through the lesser tuberosity.  IrriSept solution utilized on the humeral canal followed by vancomycin powder after we sucked out the IrriSept solution from the humeral canal.  The true stem was placed and the same stability was maintained with  the +6 offset tray and standard liner.  The true standard liner was placed onto the dried Morse taper fit on the humerus.  Excellent stability obtained with extension, and abduction as well as a forward force as well as internal and external rotation.  A  4 liters of pouring irrigation were then utilized.  Axillary nerve again palpated and found to be intact.  Subscap was then repaired to the lesser tuberosity with the arm in 30 degrees of external rotation using the 6 SutureTapes.  Secured fixation was  achieved using Nice knots.  At this time, thorough irrigation was performed with, IrriSept solution on the implant and as well as within the incision.  Vancomycin powder placed on the implant and the rotator interval was then closed using #1 Vicryl  suture with the arm in 30 degrees of external rotation.  Next, the deltopectoral interval was closed using #1 Vicryl suture followed by interrupted inverted 0 Vicryl suture, 2-0 Vicryl suture, and 3-0 Monocryl with Steri-Strips and Aquacel dressing  applied.  The patient had approximately 50 degrees of external rotation before the subscap tightened up.  Shoulder immobilizer placed.  Luke's assistance was required at all times for retraction, opening, closing, mobilization of tissue.  His assistance  was a medical necessity.   NIK D: 03/12/2023 11:47:24 am T: 03/12/2023 1:12:00 pm  JOB: [id number]/  [id number]

## 2023-03-12 NOTE — Anesthesia Postprocedure Evaluation (Signed)
Anesthesia Post Note  Patient: [name]  Procedure(s) Performed: REVERSE SHOULDER ARTHROPLASTY (Right: Shoulder) BICEPS TENODESIS (Right: Shoulder)     Anesthesia Type: General Anesthetic complications: no   No notable events documented.  Last Vitals:  Vitals:   03/12/23 0815 03/12/23 1200  BP: 126/88 110/74  Pulse: 74 70  Resp: 15 19  Temp:  (!) 36.2 C  SpO2: 100% 97%    Last Pain:  Vitals:   03/12/23 1200  TempSrc:   PainSc: 0-No pain                 Aris Georgia

## 2023-03-12 NOTE — Transfer of Care (Signed)
Immediate Anesthesia Transfer of Care Note  Patient: [name]  Procedure(s) Performed: REVERSE SHOULDER ARTHROPLASTY (Right: Shoulder) BICEPS TENODESIS (Right: Shoulder)  Patient Location: PACU  Anesthesia Type:General  Level of Consciousness: awake and alert   Airway & Oxygen Therapy: Patient Spontanous Breathing and Patient connected to nasal cannula oxygen  Post-op Assessment: Report given to RN and Post -op Vital signs reviewed and stable  Post vital signs: Reviewed  Last Vitals:  Vitals Value Taken Time  BP 110/74 03/12/23 1202  Temp 36.2 C 03/12/23 1200  Pulse 73 03/12/23 1204  Resp 16 03/12/23 1204  SpO2 95 % 03/12/23 1204  Vitals shown include unfiled device data.  Last Pain:  Vitals:   03/12/23 0815  TempSrc:   PainSc: 0-No pain      Patients Stated Pain Goal: 5 (03/12/23 6073)  Complications: No notable events documented.

## 2023-03-12 NOTE — H&P (Signed)
[name] is an 77 y.o. male.   Chief Complaint: Right shoulder pain HPI: [name] is a 77 y.o. male who presents  reporting Right shoulder pain.  Prior glenohumeral joint injection 09/18/2022 gave him only 1 week of relief.  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  He describes difficulty AB ducting the right arm.  No fevers and chills.  Uses Tylenol and ibuprofen without much relief.  Overhead activities are difficult for him.  He is prediabetic.    Left shoulder is doing well from total shoulder replacement..  No personal or family history of DVT or pulmonary embolism.  Past Medical History:  Diagnosis Date   Abnormal CT scan, gastrointestinal tract suspect angioedema    Anemia 03/16/2016   IRON   Anxiety    Arthritis    Back pain 10/10/2012   Fatty liver 2005   seen on 2005 ultrasound,05/2015 CT.    Hemorrhoids 08/22/2016   Hemorrhoids, internal, with bleeding 2005   internal and external. 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    ENTEROSCOPY N/A 10/05/2017   Procedure: ENTEROSCOPY;  Surgeon: Iva Boop, MD;  Location: [facility] END[facility]COPY;  Service: Endoscopy;  Laterality: N/A;   FRACTURE SURGERY Left 1989   leg   HEMORRHOID BANDING  2005   IR RADIOLOGIST EVAL & MGMT  11/11/2021   KNEE SURGERY Left 2010   arthroscopy, torn carilage   KNEE SURGERY Right 2012   arthroscopy   TONSILLECTOMY  1957    TOTAL KNEE ARTHROPLASTY Right 07/24/2022   Procedure: RIGHT TOTAL KNEE ARTHROPLASTY;  Surgeon: Cammy Copa, MD;  Location[facility] OR;  Service: Orthopedics;  Laterality: Right;   TOTAL SHOULDER ARTHROPLASTY Left 11/01/2020   Procedure: LEFT ANATOMIC SHOULDER REPLACEMENT;  Surgeon: Cammy Copa, MD;  Location: [facility] OR;  Service: Orthopedics;  Laterality: Left;   VENTRAL HERNIA REPAIR N/A 06/03/2018   Procedure: LAPAROSCOPIC VENTRAL WALL  HERNIA  REPAIR WITH MESH  ERAS PATHWAY;  Surgeon: Karie Soda, MD;  Location: [facility] ORS;  Service: General;  Laterality: N/A;    Family History  Problem Relation Age of Onset   Stroke Mother    Aneurysm Mother        brain   Arthritis Father    Liver cancer Father        liver, atomic testing in military   Gallbladder disease Father    Hyperlipidemia Sister    Hypertension Sister    Obesity Sister    Diabetes Sister    Diabetes Brother    Hyperlipidemia Sister    Hypertension Sister    Obesity Sister    Diabetes Sister    Aneurysm Brother        brain   COPD Brother    Gallbladder disease Sister        x4   Colon  cancer Neg Hx    Colon polyps Neg Hx    Esophageal cancer Neg Hx    Rectal cancer Neg Hx    Stomach cancer Neg Hx    Prostate cancer Neg Hx    CAD Neg Hx    Social History:  reports that he has never smoked. He has never used smokeless tobacco. He reports that he does not currently use alcohol. He reports that he does not use drugs.  Allergies: No Known Allergies  Medications Prior to Admission  Medication Sig Dispense Refill   ALPRAZolam (XANAX) 1 MG tablet TAKE 1 TABLET (1 MG TOTAL) BY MOUTH AT BEDTIME AS NEEDED FOR ANXIETY. 30 tablet 0   amLODipine (NORVASC) 10 MG tablet TAKE 1 TABLET DAILY 90 tablet 3   diclofenac Sodium (VOLTAREN) 1 % GEL Apply 2 g topically daily as needed (shoulder pain).     Iron, Ferrous Sulfate, 325 (65 Fe) MG TABS Take 325 mg by mouth daily. 30 tablet 1   losartan (COZAAR) 50 MG tablet TAKE  1 TABLET DAILY (NEED FURTHER EVALUATION AND/OR LABORATORY TESTING BEFORE FURTHER REFILLS GIVEN, MAKE APPOINTMENT FOR THIS) (Patient taking differently: Take 50 mg by mouth daily.) 90 tablet 3   meloxicam (MOBIC) 15 MG tablet TAKE 1 TABLET (15 MG TOTAL) BY MOUTH DAILY. 45 tablet 1   metoprolol succinate (TOPROL-XL) 50 MG 24 hr tablet TAKE AS INSTRUCTED BY YOUR PRESCRIBER (Patient taking differently: Take 50 mg by mouth daily.) 90 tablet 3   Multiple Vitamins-Minerals (MULTIVITAMIN ADULT PO) Take 2 tablets by mouth daily.     rosuvastatin (CRESTOR) 20 MG tablet Take 1 tablet (20 mg total) by mouth daily. 90 tablet 3   tamsulosin (FLOMAX) 0.4 MG CAPS capsule Take 0.4 mg by mouth daily.     aspirin 81 MG chewable tablet Chew 1 tablet (81 mg total) by mouth 2 (two) times daily. (Patient not taking: Reported on 02/20/2023) 60 tablet 0   docusate sodium (COLACE) 100 MG capsule Take 1 capsule (100 mg total) by mouth 2 (two) times daily. (Patient not taking: Reported on 02/20/2023) 10 capsule 0   hydrocortisone (ANUSOL-HC) 25 MG suppository Place 1 suppository (25 mg total) rectally 2 (two) times daily as needed for hemorrhoids or anal itching. (Patient not taking: Reported on 02/20/2023) 60 suppository 1   hyoscyamine (ANASPAZ) 0.125 MG TBDP disintergrating tablet Place 1 tablet (0.125 mg total) under the tongue every 4 (four) hours as needed for cramping. (Patient not taking: Reported on 02/20/2023) 30 tablet 2   predniSONE (STERAPRED UNI-PAK 21 TAB) 5 MG (21) TBPK tablet Take dosepak as directed (Patient not taking: Reported on 02/20/2023) 21 tablet 0   tiZANidine (ZANAFLEX) 2 MG tablet TAKE 0.5-2 TABLETS (1-4 MG TOTAL) BY MOUTH 2 (TWO) TIMES DAILY AS NEEDED FOR MUSCLE SPASMS. (Patient not taking: Reported on 02/20/2023) 30 tablet 1    No results found for this or any previous visit (from the past 48 hour(s)). No results found.  Review of Systems  Musculoskeletal:  Positive for arthralgias.  All other systems  reviewed and are negative.   Blood pressure 130/82, pulse 86, temperature (!) 97.5 F (36.4 C), temperature source Oral, resp. rate 16, height 5' 10.5" (1.791 m), weight 90.7 kg, SpO2 98%. Physical Exam Vitals reviewed.  HENT:     Head: Normocephalic.     Nose: Nose normal.     Mouth/Throat:     Mouth: Mucous membranes are moist.  Eyes:     Pupils: Pupils are equal,  round, and reactive to light.  Cardiovascular:     Rate and Rhythm: Normal rate.     Pulses: Normal pulses.  Pulmonary:     Effort: Pulmonary effort is normal.  Abdominal:     General: Abdomen is flat.  Musculoskeletal:     Cervical back: Normal range of motion.  Skin:    General: Skin is warm.     Capillary Refill: Capillary refill takes less than 2 seconds.  Neurological:     General: No focal deficit present.     Mental Status: He is alert.  Psychiatric:        Mood and Affect: Mood normal.    Ortho exam demonstrates bilateral shoulder passive external rotation of 60 degrees. Subscap strength on the left and right at 5 out of 5. Supraspinatus strength on the right is 4 out of 5 and infraspinatus strength is 5- out of 5 on the right-hand side. Passive range of motion is maintained but actively he can only get up to about 60 or 70 degrees of abduction on the right 110 on the left. Deltoid fires.   Assessment/Plan Impression is well-functioning left total shoulder replacement. Right shoulder has arthritis along with some rotator cuff weakness.  CT scan for patient specific instrumentation.  Has been performed.  Reverse replacement likely in this case due to weakness and abduction due to  rotator cuff pathology. . The risks and benefits of shoulder replacement are discussed with [name] including not limited to infection or vessel damage incomplete pain relief as well as loss of some motion. Patient understands risk benefits along with the rehabilitative process involved. All questions answered.   Burnard Bunting,  MD 03/12/2023, 6:34 AM

## 2023-03-13 ENCOUNTER — Encounter (HOSPITAL_COMMUNITY): Payer: Self-pay | Admitting: Orthopedic Surgery

## 2023-03-13 DIAGNOSIS — Z96612 Presence of left artificial shoulder joint: Secondary | ICD-10-CM | POA: Diagnosis not present

## 2023-03-13 DIAGNOSIS — I10 Essential (primary) hypertension: Secondary | ICD-10-CM | POA: Diagnosis not present

## 2023-03-13 DIAGNOSIS — Z96651 Presence of right artificial knee joint: Secondary | ICD-10-CM | POA: Diagnosis not present

## 2023-03-13 DIAGNOSIS — M7521 Bicipital tendinitis, right shoulder: Secondary | ICD-10-CM | POA: Diagnosis not present

## 2023-03-13 DIAGNOSIS — M75101 Unspecified rotator cuff tear or rupture of right shoulder, not specified as traumatic: Secondary | ICD-10-CM | POA: Diagnosis not present

## 2023-03-13 DIAGNOSIS — M19011 Primary osteoarthritis, right shoulder: Secondary | ICD-10-CM | POA: Diagnosis not present

## 2023-03-13 MED ORDER — OXYCODONE HCL 5 MG PO TABS: 5.0000 mg | ORAL_TABLET | ORAL | 0 refills | Status: DC | PRN

## 2023-03-13 MED ORDER — GABAPENTIN 100 MG PO CAPS: 100.0000 mg | ORAL_CAPSULE | Freq: Two times a day (BID) | ORAL | 1 refills | Status: DC

## 2023-03-13 MED ORDER — METHOCARBAMOL 500 MG PO TABS: 500.0000 mg | ORAL_TABLET | Freq: Three times a day (TID) | ORAL | 0 refills | Status: DC | PRN

## 2023-03-13 MED ORDER — ACETAMINOPHEN 325 MG PO TABS: 325.0000 mg | ORAL_TABLET | Freq: Four times a day (QID) | ORAL | 0 refills | Status: AC | PRN

## 2023-03-13 NOTE — Evaluation (Signed)
Occupational Therapy Evaluation Patient Details Name: [name] MRN: [id number] DOB: 10/30/1945 Today's Date: 03/13/2023   History of Present Illness Patient is a 77 y/o male presenting to the hospital with R shoulder replacement (reverse total shoulder, and biceps tenodesis) on 03/13/23. PMH positive for L shoulder replacement, R TKA, HTN, HLD, ventral hernia repair.   Clinical Impression   Prior to this admission, patient independent in ADLs and functional mobility, and with previous L shoulder surgery. Currently, patient presenting with R shoulder pain, and limitations in completing ADLs due to shoulder immobilization. Patient with previous L shoulder surgery therefore well versed in precautions and education, however OT going through all education with patient. Patient wanting to wait for wife to get dressed, however good teach back noted throughout. OT will defer to MD for discharge planning.      If plan is discharge home, recommend the following: A little help with bathing/dressing/bathroom;Assist for transportation    Functional Status Assessment  Patient has had a recent decline in their functional status and demonstrates the ability to make significant improvements in function in a reasonable and predictable amount of time.  Equipment Recommendations  None recommended by OT    Recommendations for Other Services       Precautions / Restrictions Precautions Precautions: Shoulder Type of Shoulder Precautions: NWB shoulder, No AROM of shoulder, ER of shoulder no more than 30 degrees, able to complete AROM to elbow, wrist and hand, ok for pendulums Shoulder Interventions: Shoulder sling/immobilizer;Off for dressing/bathing/exercises Precaution Booklet Issued: Yes (comment) Restrictions Weight Bearing Restrictions: Yes RUE Weight Bearing: Non weight bearing      Mobility Bed Mobility Overal bed mobility: Modified Independent             General bed mobility  comments: use of bed rail    Transfers Overall transfer level: Needs assistance Equipment used: None Transfers: Sit to/from Stand Sit to Stand: Supervision           General transfer comment: able to complete without assist      Balance Overall balance assessment: Mild deficits observed, not formally tested                                         ADL either performed or assessed with clinical judgement   ADL Overall ADL's : Needs assistance/impaired Eating/Feeding: Set up;Sitting   Grooming: Set up;Sitting   Upper Body Bathing: Minimal assistance;Sitting   Lower Body Bathing: Minimal assistance;Sitting/lateral leans;Sit to/from stand   Upper Body Dressing : Minimal assistance;Sitting   Lower Body Dressing: Minimal assistance;Sit to/from stand;Sitting/lateral leans   Toilet Transfer: Supervision/safety   Toileting- Clothing Manipulation and Hygiene: Minimal assistance;Sitting/lateral lean;Sit to/from stand       Functional mobility during ADLs: Minimal assistance;Cueing for sequencing;Cueing for safety General ADL Comments: Patient presenting with R shoulder pain, and limitations in completing ADLs due to shoulder immobilization. Patient with previous L shoulder surgery therefore well versed in precautions and education, however OT going through all education with patient. Patient wanting to wait for wife to get dressed, however good teach back noted throughout. OT will defer to MD for discharge planning.     Vision Baseline Vision/History: 0 No visual deficits (Catarct surgery) Ability to See in Adequate Light: 0 Adequate Patient Visual Report: No change from baseline Vision Assessment?: No apparent visual deficits     Perception  Praxis         Pertinent Vitals/Pain Pain Assessment Pain Assessment: 0-10 Pain Score: 4  Pain Location: R shoulder Pain Descriptors / Indicators: Discomfort, Grimacing, Guarding Pain Intervention(s):  Limited activity within patient's tolerance, Monitored during session, Repositioned     Extremity/Trunk Assessment Upper Extremity Assessment Upper Extremity Assessment: RUE deficits/detail RUE Deficits / Details: limited sensation in RUE, cannot currently flex elbow due to nerve block RUE: Unable to fully assess due to immobilization;Shoulder pain at rest RUE Sensation: decreased light touch RUE Coordination: decreased fine motor;decreased gross motor   Lower Extremity Assessment Lower Extremity Assessment: Overall WFL for tasks assessed   Cervical / Trunk Assessment Cervical / Trunk Assessment: Normal   Communication Communication Communication: No apparent difficulties   Cognition Arousal: Alert Behavior During Therapy: WFL for tasks assessed/performed Overall Cognitive Status: Within Functional Limits for tasks assessed                                       General Comments       Exercises     Shoulder Instructions      Home Living Family/patient expects to be discharged to:: Private residence Living Arrangements: Spouse/significant other Available Help at Discharge: Family;Available 24 hours/day Type of Home: House Home Access: Stairs to enter [employer] of Steps: 2 Entrance Stairs-Rails: None Home Layout: Two level Alternate Level Stairs-Number of Steps: 14 Alternate Level Stairs-Rails: Left;Right Bathroom Shower/Tub: Producer, television/film/video: Handicapped height Bathroom Accessibility: Yes   Home Equipment: Agricultural consultant (2 wheels);Cane - single point;BSC/3in1   Additional Comments: Independent      Prior Functioning/Environment Prior Level of Function : Independent/Modified Independent;Driving                        OT Problem List: Decreased strength;Decreased range of motion;Decreased activity tolerance;Pain;Impaired UE functional use      OT Treatment/Interventions:      OT Goals(Current goals can  be found in the care plan section) Acute Rehab OT Goals Patient Stated Goal: to get to rehab for my shoulder when it is time OT Goal Formulation: With patient Time For Goal Achievement: 03/27/23 Potential to Achieve Goals: Good  OT Frequency:      Co-evaluation              AM-PAC OT "6 Clicks" Daily Activity     Outcome Measure Help from another person eating meals?: A Little Help from another person taking care of personal grooming?: A Little Help from another person toileting, which includes using toliet, bedpan, or urinal?: A Little Help from another person bathing (including washing, rinsing, drying)?: A Little Help from another person to put on and taking off regular upper body clothing?: A Little Help from another person to put on and taking off regular lower body clothing?: A Little 6 Click Score: 18   End of Session Equipment Utilized During Treatment: Other (comment) (Shoulder education) Nurse Communication: Mobility status;Other (comment) (No call bell in room)  Activity Tolerance: Patient tolerated treatment well Patient left: in chair  OT Visit Diagnosis: Pain Pain - Right/Left: Right Pain - part of body: Shoulder                Time: 1610-9604 OT Time Calculation (min): 30 min Charges:  OT General Charges $OT Visit: 1 Visit OT Evaluation $OT Eval Moderate Complexity: 1 Mod OT Treatments $  Self Care/Home Management : 8-22 mins  Pollyann Glen E. Vallarie Fei, OTR/L Acute Rehabilitation Services [phone]   Cherlyn Cushing 03/13/2023, 8:58 AM

## 2023-03-13 NOTE — Progress Notes (Signed)
Pt discharged to home. DC instructions given with wife at bedside. No concerns voiced. Pt left unit in wheelchair pushed by hospital volunteer. Left in stable condition.

## 2023-03-13 NOTE — Progress Notes (Signed)
  Subjective: Patient stable.  Pain controlled on Dilaudid.   Objective: Vital signs in last 24 hours: Temp:  [97.1 F (36.2 C)-98.6 F (37 C)] 98.6 F (37 C) (09/13 0555) Pulse Rate:  [55-90] 78 (09/13 0555) Resp:  [12-19] 18 (09/13 0555) BP: (101-126)/(67-88) 110/71 (09/13 0555) SpO2:  [90 %-100 %] 100 % (09/13 0555)  Intake/Output from previous day: 09/12 0701 - 09/13 0700 In: 2245.2 [P.O.:180; I.V.:1897.4; IV Piggyback:167.8] Out: 400 [Urine:300; Blood:100] Intake/Output this shift: No intake/output data recorded.  Exam:  Intact pulses distally No cellulitis present  Labs: No results for input(s): "HGB" in the last 72 hours. No results for input(s): "WBC", "RBC", "HCT", "PLT" in the last 72 hours. No results for input(s): "NA", "K", "CL", "CO2", "BUN", "CREATININE", "GLUCOSE", "CALCIUM" in the last 72 hours. No results for input(s): "LABPT", "INR" in the last 72 hours.  Assessment/Plan: Plan at this time is occupational therapy this morning with discharge later this afternoon.  Patient does have CPM machine at home.  Will send him home with oxycodone and gabapentin and Robaxin as a muscle relaxer.  Postop radiographs look good.   G Scott Jamol Ginyard 03/13/2023, 8:02 AM

## 2023-03-13 NOTE — Plan of Care (Signed)
  Problem: Education: Goal: Knowledge of the prescribed therapeutic regimen will improve Outcome: Progressing Goal: Individualized Educational Video(s) Outcome: Progressing   Problem: Activity: Goal: Ability to avoid complications of mobility impairment will improve Outcome: Progressing   Problem: Skin Integrity: Goal: Will show signs of wound healing Outcome: Progressing   Problem: Activity: Goal: Ability to tolerate increased activity will improve Outcome: Progressing

## 2023-03-13 NOTE — Progress Notes (Signed)
PT Cancellation Note  Patient Details Name: [name] MRN: [id number] DOB: 03-Dec-1945   Cancelled Treatment:    Reason Eval/Treat Not Completed: PT screened, no needs identified, will sign off (Per OT, pt has no acute PT needs.)   Gladys Damme 03/13/2023, 8:44 AM

## 2023-03-14 DIAGNOSIS — M19011 Primary osteoarthritis, right shoulder: Secondary | ICD-10-CM

## 2023-03-14 DIAGNOSIS — M7521 Bicipital tendinitis, right shoulder: Secondary | ICD-10-CM

## 2023-03-14 NOTE — Discharge Summary (Signed)
Physician Discharge Summary      Patient ID: [name] MRN: [id number] DOB/AGE: [birth year] 75 y.o.  Admit date: 03/12/2023 Discharge date: 03/13/2023  Admission Diagnoses:  Principal Problem:   Shoulder fracture Active Problems:   S/P reverse total shoulder arthroplasty, right   Primary osteoarthritis of right shoulder   Biceps tendonitis on right   Discharge Diagnoses:  Same  Surgeries: Procedure(s): REVERSE SHOULDER ARTHROPLASTY BICEPS TENODESIS on 03/12/2023   Consultants:   Discharged Condition: Stable  Hospital Course: [name] is an 77 y.o. male who was admitted 03/12/2023 with a chief complaint of right shoulder pain, and found to have a diagnosis of shoulder arthritis and biceps tendinitis.  They were brought to the operating room on 03/12/2023 and underwent the above named procedures.  Patient tolerated the procedure well and was seen by Occupational Therapy on postop day #1.  Discharged to home in good condition with pain control after his evaluation.  He will use shoulder CPM machine for the first week after surgery.  Oxycodone for pain medicine along with muscle relaxer.  Follow-up in 2 weeks for clinical recheck  Antibiotics given:  Anti-infectives (From admission, onward)    Start     Dose/Rate Route Frequency Ordered Stop   03/12/23 1630  ceFAZolin (ANCEF) IVPB 2g/100 mL premix        2 g 200 mL/hr over 30 Minutes Intravenous Every 8 hours 03/12/23 1517 03/13/23 0944   03/12/23 0929  vancomycin (VANCOCIN) powder  Status:  Discontinued          As needed 03/12/23 0929 03/12/23 1156   03/12/23 0700  ceFAZolin (ANCEF) IVPB 2g/100 mL premix        2 g 200 mL/hr over 30 Minutes Intravenous On call to O.R. 03/12/23 0654 03/12/23 0840   03/12/23 0658  ceFAZolin (ANCEF) 2-4 GM/100ML-% IVPB       Note to Pharmacy: Payton Emerald A: cabinet override      03/12/23 0658 03/12/23 1429     .  Recent vital signs:  Vitals:   03/13/23 0846 03/13/23 1240  BP:  125/68 117/76  Pulse: (!) 56 79  Resp: 20 19  Temp: 98.1 F (36.7 C) 99.1 F (37.3 C)  SpO2: 100% 100%    Recent laboratory studies:  Results for orders placed or performed during the hospital encounter of 03/12/23  Surgical pcr screen   Specimen: Nasal Mucosa; Nasal Swab  Result Value Ref Range   MRSA, PCR NEGATIVE NEGATIVE   Staphylococcus aureus NEGATIVE NEGATIVE    Discharge Medications:   Allergies as of 03/13/2023   No Known Allergies      Medication List     STOP taking these medications    Anaspaz 0.125 MG Tbdp disintergrating tablet Generic drug: hyoscyamine   docusate sodium 100 MG capsule Commonly known as: COLACE   hydrocortisone 25 MG suppository Commonly known as: ANUSOL-HC   meloxicam 15 MG tablet Commonly known as: MOBIC   predniSONE 5 MG (21) Tbpk tablet Commonly known as: STERAPRED UNI-PAK 21 TAB   tiZANidine 2 MG tablet Commonly known as: ZANAFLEX   Voltaren 1 % Gel Generic drug: diclofenac Sodium       TAKE these medications    acetaminophen 325 MG tablet Commonly known as: TYLENOL Take 1-2 tablets (325-650 mg total) by mouth every 6 (six) hours as needed for mild pain (pain score 1-3 or temp > 100.5).   ALPRAZolam 1 MG tablet Commonly known as: XANAX TAKE 1 TABLET (1 MG TOTAL)  BY MOUTH AT BEDTIME AS NEEDED FOR ANXIETY.   amLODipine 10 MG tablet Commonly known as: NORVASC TAKE 1 TABLET DAILY   aspirin 81 MG chewable tablet Chew 1 tablet (81 mg total) by mouth 2 (two) times daily.   gabapentin 100 MG capsule Commonly known as: NEURONTIN Take 1 capsule (100 mg total) by mouth 2 (two) times daily.   Iron (Ferrous Sulfate) 325 (65 Fe) MG Tabs Take 325 mg by mouth daily.   losartan 50 MG tablet Commonly known as: COZAAR TAKE 1 TABLET DAILY (NEED FURTHER EVALUATION AND/OR LABORATORY TESTING BEFORE FURTHER REFILLS GIVEN, MAKE APPOINTMENT FOR THIS) What changed: See the new instructions.   methocarbamol 500 MG  tablet Commonly known as: ROBAXIN Take 1 tablet (500 mg total) by mouth every 8 (eight) hours as needed for muscle spasms.   metoprolol succinate 50 MG 24 hr tablet Commonly known as: TOPROL-XL TAKE AS INSTRUCTED BY YOUR PRESCRIBER What changed: See the new instructions.   MULTIVITAMIN ADULT PO Take 2 tablets by mouth daily.   oxyCODONE 5 MG immediate release tablet Commonly known as: Oxy IR/ROXICODONE Take 1 tablet (5 mg total) by mouth every 4 (four) hours as needed for moderate pain (pain score 4-6).   rosuvastatin 20 MG tablet Commonly known as: CRESTOR Take 1 tablet (20 mg total) by mouth daily.   tamsulosin 0.4 MG Caps capsule Commonly known as: FLOMAX Take 0.4 mg by mouth daily.        Diagnostic Studies: DG Shoulder Right Port  Result Date: 03/12/2023 CLINICAL DATA:  Status post right shoulder surgery. EXAM: RIGHT SHOULDER - 1 VIEW COMPARISON:  Right shoulder radiographs 05/26/2022 FINDINGS: Interval reverse right shoulder arthroplasty. On single frontal view, no perihardware lucency is seen to indicate hardware failure or loosening. Expected postoperative subcutaneous and subacromial/subdeltoid air. Mild acromioclavicular joint space narrowing and peripheral osteophytosis. No acute fracture or dislocation. IMPRESSION: Interval reverse right shoulder arthroplasty without evidence of hardware failure or loosening. Electronically Signed   By: Neita Garnet M.D.   On: 03/12/2023 16:05    Disposition: Discharge disposition: 01-Home or Self Care       Discharge Instructions     Call MD / Call 911   Complete by: As directed    If you experience chest pain or shortness of breath, CALL 911 and be transported to the hospital emergency room.  If you develope a fever above 101 F, pus (white drainage) or increased drainage or redness at the wound, or calf pain, call your surgeon's office.   Constipation Prevention   Complete by: As directed    Drink plenty of fluids.  Prune  juice may be helpful.  You may use a stool softener, such as Colace (over the counter) 100 mg twice a day.  Use MiraLax (over the counter) for constipation as needed.   Diet - low sodium heart healthy   Complete by: As directed    Discharge instructions   Complete by: As directed    CPM machine 1 hour 3 times a day, increase degrees daily Okay to shower dressing is waterproof Keep arm in sling when not in the CPM machine for the first 2 weeks Follow-up in 2 weeks   Increase activity slowly as tolerated   Complete by: As directed    Post-operative opioid taper instructions:   Complete by: As directed    POST-OPERATIVE OPIOID TAPER INSTRUCTIONS: It is important to wean off of your opioid medication as soon as possible. If you do not need  pain medication after your surgery it is ok to stop day one. Opioids include: Codeine, Hydrocodone(Norco, Vicodin), Oxycodone(Percocet, oxycontin) and hydromorphone amongst others.  Long term and even short term use of opiods can cause: Increased pain response Dependence Constipation Depression Respiratory depression And more.  Withdrawal symptoms can include Flu like symptoms Nausea, vomiting And more Techniques to manage these symptoms Hydrate well Eat regular healthy meals Stay active Use relaxation techniques(deep breathing, meditating, yoga) Do Not substitute Alcohol to help with tapering If you have been on opioids for less than two weeks and do not have pain than it is ok to stop all together.  Plan to wean off of opioids This plan should start within one week post op of your joint replacement. Maintain the same interval or time between taking each dose and first decrease the dose.  Cut the total daily intake of opioids by one tablet each day Next start to increase the time between doses. The last dose that should be eliminated is the evening dose.           Follow-up Information     Bradd Canary, MD Follow up.   Specialty:  Family Medicine Contact information: [address] [place] [address] [phone]                  Signed: Burnard Bunting 03/14/2023, 9:49 PM

## 2023-03-17 ENCOUNTER — Telehealth: Payer: Self-pay | Admitting: *Deleted

## 2023-03-17 DIAGNOSIS — M19011 Primary osteoarthritis, right shoulder: Secondary | ICD-10-CM

## 2023-03-17 DIAGNOSIS — Z96611 Presence of right artificial shoulder joint: Secondary | ICD-10-CM

## 2023-03-17 DIAGNOSIS — M7521 Bicipital tendinitis, right shoulder: Secondary | ICD-10-CM

## 2023-03-17 NOTE — Telephone Encounter (Signed)
Ortho bundle D/C call completed.

## 2023-03-20 ENCOUNTER — Other Ambulatory Visit: Payer: Self-pay | Admitting: Surgical

## 2023-03-20 ENCOUNTER — Telehealth: Payer: Self-pay | Admitting: Orthopedic Surgery

## 2023-03-20 MED ORDER — OXYCODONE HCL 5 MG PO TABS: 5.0000 mg | ORAL_TABLET | Freq: Four times a day (QID) | ORAL | 0 refills | Status: DC | PRN

## 2023-03-20 NOTE — Telephone Encounter (Signed)
Lvm advising

## 2023-03-20 NOTE — Telephone Encounter (Signed)
Patient called. Would like a refill on Oxycodone. His cb# is [phone]

## 2023-03-20 NOTE — Telephone Encounter (Signed)
Sent in refill

## 2023-03-24 ENCOUNTER — Telehealth: Payer: Self-pay | Admitting: *Deleted

## 2023-03-24 NOTE — Telephone Encounter (Signed)
Call to check on patient today. Patient reports he is doing very well. Had BM today after use of stool softener and suggested using otc daily miralax if he needs for constipation. Taking pain medication about every 8 hours and using CPM up to 3 times daily. Doing very well and no new needs today.Reminded of f/u on Friday, 03/27/23 with Franky Macho.

## 2023-03-27 ENCOUNTER — Other Ambulatory Visit (INDEPENDENT_AMBULATORY_CARE_PROVIDER_SITE_OTHER): Payer: Self-pay

## 2023-03-27 ENCOUNTER — Encounter: Payer: Self-pay | Admitting: Surgical

## 2023-03-27 ENCOUNTER — Ambulatory Visit (INDEPENDENT_AMBULATORY_CARE_PROVIDER_SITE_OTHER): Payer: Medicare Other | Admitting: Surgical

## 2023-03-27 ENCOUNTER — Telehealth: Payer: Self-pay | Admitting: *Deleted

## 2023-03-27 DIAGNOSIS — Z96611 Presence of right artificial shoulder joint: Secondary | ICD-10-CM

## 2023-03-27 DIAGNOSIS — M25561 Pain in right knee: Secondary | ICD-10-CM | POA: Diagnosis not present

## 2023-03-27 NOTE — Telephone Encounter (Signed)
Ortho bundle 14 day in office meeting completed. °

## 2023-03-27 NOTE — Therapy (Signed)
OUTPATIENT PHYSICAL THERAPY SHOULDER EVALUATION   Patient Name: [name] MRN: [id number] DOB:June 06, 1946, 77 y.o., male Today's Date: 03/30/2023   END OF SESSION:  PT End of Session - 03/30/23 1446     Visit Number 1    Date for PT Re-Evaluation 05/25/23    Authorization Type Medicare & Tricare    PT Start Time 1446    PT Stop Time 1532    PT Time Calculation (min) 46 min    Activity Tolerance Patient tolerated treatment well    Behavior During Therapy [facility] for tasks assessed/performed             Past Medical History:  Diagnosis Date   Abnormal CT scan, gastrointestinal tract suspect angioedema    Anemia 03/16/2016   IRON   Anxiety    Arthritis    Back pain 10/10/2012   Fatty liver 2005   seen on 2005 ultrasound,05/2015 CT.    Hemorrhoids 08/22/2016   Hemorrhoids, internal, with bleeding 2005   internal and external. 2005 band ligation (dr Noe Gens in [facility])   Hyperglycemia 08/21/2013   Hyperlipidemia    Hypertension    Nocturia 08/21/2013   Obesity (BMI 30.0-34.9)    Osteoporosis    Pneumonia    as a child   Pre-diabetes    Rectal bleeding 07/30/2014   Renal cyst    Left Kidney   SBO (small bowel obstruction) (HCC)    Tubular adenoma of colon before 2005, 2012, 08/2014   Unspecified constipation 02/06/2014   Vitamin D deficiency    Past Surgical History:  Procedure Laterality Date   BICEPT TENODESIS Right 03/12/2023   Procedure: BICEPS TENODESIS;  Surgeon: Cammy Copa, MD;  Location: [facility] OR;  Service: Orthopedics;  Laterality: Right;   CATARACT EXTRACTION W/ INTRAOCULAR LENS IMPLANT Bilateral    COLONOSCOPY  before 2005, 2012, 2013, 08/2014.    ENTEROSCOPY N/A 10/05/2017   Procedure: ENTEROSCOPY;  Surgeon: Iva Boop, MD;  Location: [facility];  Service: Endoscopy;  Laterality: N/A;   FRACTURE SURGERY Left 1989   leg   HEMORRHOID BANDING  2005   IR RADIOLOGIST EVAL & MGMT  11/11/2021   KNEE SURGERY Left 2010   arthroscopy, torn  carilage   KNEE SURGERY Right 2012   arthroscopy   REVERSE SHOULDER ARTHROPLASTY Right 03/12/2023   Procedure: REVERSE SHOULDER ARTHROPLASTY;  Surgeon: Cammy Copa, MD;  Location: [facility] OR;  Service: Orthopedics;  Laterality: Right;   TONSILLECTOMY  1957   TOTAL KNEE ARTHROPLASTY Right 07/24/2022   Procedure: RIGHT TOTAL KNEE ARTHROPLASTY;  Surgeon: Cammy Copa, MD;  Location: [facility] OR;  Service: Orthopedics;  Laterality: Right;   TOTAL SHOULDER ARTHROPLASTY Left 11/01/2020   Procedure: LEFT ANATOMIC SHOULDER REPLACEMENT;  Surgeon: Cammy Copa, MD;  Location: [facility] OR;  Service: Orthopedics;  Laterality: Left;   VENTRAL HERNIA REPAIR N/A 06/03/2018   Procedure: LAPAROSCOPIC VENTRAL WALL  HERNIA  REPAIR WITH MESH  ERAS PATHWAY;  Surgeon: Karie Soda, MD;  Location: [facility] ORS;  Service: General;  Laterality: N/A;   Patient Active Problem List   Diagnosis Date Noted   Primary osteoarthritis of right shoulder 03/14/2023   Biceps tendonitis on right 03/14/2023   Shoulder fracture 03/12/2023   S/P reverse total shoulder arthroplasty, right 03/12/2023   Arthritis of right knee 07/27/2022   S/P total knee arthroplasty, right 07/24/2022   Shoulder arthritis    S/P shoulder replacement, 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   Skin tags, multiple acquired 07/07/2019   LUQ pain  07/07/2019   OA (osteoarthritis) of knee 03/16/2019   Ventral hernia s/p lap repair with mesh 06/03/2018 06/03/2018   Hemorrhoids 08/22/2016   Anemia 03/16/2016   Right shoulder pain 02/26/2016   Abnormal CT scan, gastrointestinal tract suspect angioedema    Abdominal pain    Hyperglycemia 08/21/2013   Low back pain 10/10/2012   Hyperlipidemia    Hypertension, essential     Obesity (BMI 30.0-34.9)     PCP: Bradd Canary, MD   REFERRING PROVIDER: Cammy Copa, MD   REFERRING DIAG:  [phone] (ICD-10-CM) - S/P reverse total shoulder arthroplasty, right  M19.011 (ICD-10-CM) - Primary osteoarthritis of right shoulder   M75.21 (ICD-10-CM) - Biceps tendonitis on right    THERAPY DIAG:  Stiffness of right shoulder, not elsewhere classified  Acute pain of right shoulder  Abnormal posture  Muscle weakness (generalized)  RATIONALE FOR EVALUATION AND TREATMENT: Rehabilitation  ONSET DATE: 03/12/23 - R reverse TSA + biceps tenodesis  NEXT MD VISIT: 04/22/23   SUBJECTIVE:                                                                                                                                                                                                         SUBJECTIVE STATEMENT: Pt reports the PA released him from the sling as of his postop follow-up on Friday 03/27/23. He states he had the CPM at home 3x/day x 1.5 hrs for the first 2 weeks post, but now discontinued. Biggest concerns currently with opening jars and feeding himself with R hand. Still sleeping in the recliner as the bed is too uncomfortable. He has been completing distal UE HEP exercises 3x/day.  PAIN: Are you having pain? Yes: NPRS scale: 6/10 Pain location: R anterior shoulder  Pain description: ache Aggravating factors: raising his arm too high, working with the mouse on the computer Relieving factors: oxycodone, gabapentin and muscle relaxant, ice  PERTINENT HISTORY:  R TKA 07/23/22, L TSA 11/01/20, anxiety, arthritis, back pain, osteoporosis, prediabetes, ventral hernia repair  PRECAUTIONS: Shoulder  RED FLAGS: None  HAND DOMINANCE: Right  WEIGHT BEARING RESTRICTIONS: Yes NWB R UE  FALLS:  Has patient fallen in last 6 months? No  LIVING ENVIRONMENT: Lives with: lives with their spouse Lives in: House/apartment Stairs: Yes: Internal: 12 steps; on right going up and External: 2 steps; none Has following equipment at home: Single point cane and Walker - 2 wheeled  OCCUPATION: Retired  PLOF: Independent and Leisure: Golf, yard work, gym - bike    PATIENT GOALS: "Better ROM and less pain."  OBJECTIVE:  (objective measures completed at initial evaluation unless otherwise dated)  DIAGNOSTIC FINDINGS:  03/12/23 - DG Right shoulder - IMPRESSION: Interval reverse right shoulder arthroplasty without evidence ofhardware failure or loosening.  PATIENT SURVEYS:  Quick Dash 70.5 / 100 = 70.5 %  COGNITION: Overall cognitive status: Within functional limits for tasks assessed     SENSATION: WFL Pt reports intermittent gnawing "toothache" feeling in his L thumb, tingling has subsided  POSTURE: rounded shoulders and forward head  UPPER EXTREMITY ROM:   Passive ROM Right eval  Shoulder flexion 100  Shoulder extension   Shoulder abduction 88  Shoulder adduction   Shoulder internal rotation   Shoulder external rotation 15  (Blank rows = not tested)  AROM assessment deferred on eval due to patient only 2 weeks postop Active ROM Right eval Left eval  Shoulder flexion    Shoulder extension    Shoulder abduction    Shoulder adduction    Shoulder internal rotation    Shoulder external rotation    Elbow flexion    Elbow extension    Wrist flexion    Wrist extension    Wrist ulnar deviation    Wrist radial deviation    Wrist pronation    Wrist supination    (Blank rows = not tested)  UPPER EXTREMITY MMT: (Deferred on eval due to only 2 weeks postop)  MMT Right eval Left eval  Shoulder flexion    Shoulder extension    Shoulder abduction    Shoulder adduction    Shoulder internal rotation    Shoulder external rotation    Middle trapezius    Lower trapezius    Elbow flexion    Elbow extension    Wrist flexion    Wrist extension    Wrist ulnar deviation    Wrist radial deviation    Wrist pronation    Wrist supination    Grip strength (lbs)    (Blank rows = not tested)  PALPATION:  Mild TTP R pecs, biceps, teres group    TODAY'S TREATMENT:   03/30/23 THERAPEUTIC EXERCISE: to improve flexibility, strength and mobility.  Demonstration, verbal and tactile cues throughout  for technique. Supine R shoulder flexion AAROM with wand to 90 x 10 Supine R scaption AAROM with wand to 90 x 10 Seated R shoulder flexion PROM towel slide on tabletop x 10 Seated R shoulder scaption PROM towel slide on tabletop x 10 Seated B scapular retraction and depression 10 x 5"   PATIENT EDUCATION:  Education details: PT eval findings, anticipated POC, and initial HEP  Person educated: Patient Education method: Explanation, Demonstration, Tactile cues, Verbal cues, and Handouts Education comprehension: verbalized understanding, returned demonstration, verbal cues required, tactile cues required, and needs further education  HOME EXERCISE PROGRAM: Access Code: [email or web address] URL: [email or web address] Date: 03/30/2023 Prepared by: Glenetta Hew  Exercises - Supine Shoulder Flexion with Dowel to 90  - 2-3 x daily - 7 x weekly - 2 sets - 10 reps - 3 sec hold - Supine Shoulder Scaption with Dowel  - 2-3 x daily - 7 x weekly - 2 sets - 10 reps - 3 sec hold - Seated Shoulder Flexion Towel Slide at Table Top  - 2-3 x daily - 7 x weekly - 2 sets - 10 reps - 3 sec  hold - Seated Shoulder Scaption Slide at Table Top with Forearm in Neutral  - 2-3 x daily - 7 x weekly - 2 sets - 10 reps - 3 sec hold -  Seated Shoulder Setting  - 2-3 x daily - 7 x weekly - 2 sets - 10 reps - 3 sec hold   ASSESSMENT:  CLINICAL IMPRESSION: Roane Dambrosio is a 77 y.o. male who was referred to physical therapy for evaluation and treatment s/p R reverse TSA with biceps tenodesis on 03/12/2023.  He reports moderate ongoing pain in right shoulder limiting his ability to find a comfortable sleeping position and interfering with ADLs.  He reports good compliance with initial postop HEP, completing distal UE exercises 3x/day.  Current deficits include abnormal posture with forward head and rounded shoulders, decreased R shoulder AROM and PROM, along with and functional weakness of R shoulder.  Postop HEP  updated to include progression to gentle P/AAROM for R shoulder using table slides and wand.  We reviewed precautions regarding positioning as well as ways to hopefully improve comfort while sleeping in bed.  Donato will benefit from skilled PT to address above deficits to improve mobility and activity tolerance with decreased pain interference.   OBJECTIVE IMPAIRMENTS: decreased activity tolerance, decreased coordination, decreased knowledge of condition, decreased ROM, decreased strength, increased fascial restrictions, impaired perceived functional ability, increased muscle spasms, impaired flexibility, impaired sensation, impaired UE functional use, postural dysfunction, and pain.   ACTIVITY LIMITATIONS: carrying, lifting, sleeping, bathing, dressing, reach over head, hygiene/grooming, and caring for others  PARTICIPATION LIMITATIONS: meal prep, cleaning, laundry, driving, shopping, community activity, and yard work  PERSONAL FACTORS: Past/current experiences, Time since onset of injury/illness/exacerbation, and 3+ comorbidities: R TKA 07/23/22, L TSA 11/01/20, anxiety, arthritis, back pain, osteoporosis, prediabetes, ventral hernia repair  are also affecting patient's functional outcome.   REHAB POTENTIAL: Good  CLINICAL DECISION MAKING: Stable/uncomplicated  EVALUATION COMPLEXITY: Low   GOALS: Goals reviewed with patient? Yes  SHORT TERM GOALS: Target date: 04/27/2023  Patient will be independent with initial HEP to improve outcomes and carryover.  Baseline:  Goal status: INITIAL  2.  Patient will report 25% improvement in R shoulder pain to improve tolerance for exercise and activity.  Baseline: 6/10 Goal status: INITIAL  LONG TERM GOALS: Target date: 05/25/2023  Patient will be independent with ongoing/advanced HEP for self-management at home.  Baseline:  Goal status: INITIAL  2.  Patient will report 75% improvement in R shoulder pain to improve QOL.  Baseline: 6/10 Goal  status: INITIAL  3.  Patient to demonstrate improved upright posture with posterior shoulder girdle engaged to promote improved glenohumeral joint mobility. Baseline: Forward head and rounded shoulder posture Goal status: INITIAL  4.  Patient to improve R shoulder AROM to [facility] without pain provocation to allow for increased ease of ADLs.  Baseline: Refer to above UE ROM table Goal status: INITIAL  5.  Patient will demonstrate improved R shoulder strength to >/= 4 to 4+/5 for functional UE use. Baseline: Refer to above UE MMT table Goal status: INITIAL  6  Patient will report </= 60% on QuickDASH to demonstrate improved functional ability (MDIC = 10).  Baseline: 70.5 / 100 = 70.5 % Goal status: INITIAL  7.  Patient to report ability to perform ADLs, household, and leisure-related tasks without limitation due to R shoulder pain, LOM or weakness.   Baseline: Limited with all activities requiring use of R arm Goal status: INITIAL    PLAN:  PT FREQUENCY: 2x/week  PT DURATION: 8 weeks  PLANNED INTERVENTIONS: Therapeutic exercises, Therapeutic activity, Neuromuscular re-education, Patient/Family education, Self Care, Joint mobilization, Dry Needling, Electrical stimulation, Cryotherapy, Moist heat, Taping, Vasopneumatic device, Ultrasound, Ionotophoresis 4mg /ml Dexamethasone,  Manual therapy, and Re-evaluation  PLAN FOR NEXT SESSION: Per reverse TSA protocol (Sx 03/12/23) - post-op week #3 as of 04/02/23 - gentle PROM - gradually increasing forward flexion and elevation in the scapular plane to 120; begin periscapular deltoid submaximal pain-free isometrics and scapular plane; gentle resisted exercises of elbow, wrist and hand   Marry Guan, PT 03/30/2023, 6:14 PM

## 2023-03-27 NOTE — Progress Notes (Signed)
Post-Op Visit Note   Patient: [name]           Date of Birth: October 09, 1945           MRN: [id number] Visit Date: 03/27/2023 PCP: Bradd Canary, MD   Assessment & Plan:  Chief Complaint:  Chief Complaint  Patient presents with   Right Shoulder - Routine Post Op    RIGHT RSA (surgery date 03-12-23)   Visit Diagnoses:  1. History of arthroplasty of right shoulder     Plan: [name] is a 77 y.o. male who presents s/p right reverse shoulder arthroplasty on 03/12/2023.  Patient is doing well and pain is overall controlled.  Up to 100 degrees on CPM machine.  Denies any chest pain, SOB, fevers, chills.  No complaint of any instability symptoms.  Taking pain medication every 8 hours along with gabapentin.  Starting physical therapy on Monday at [facility]..    On exam, patient has range of motion 15 degrees external rotation, 80 degrees abduction, 100 degrees forward elevation passively.  Intact EPL, FPL, finger abduction, finger adduction, pronation/supination, bicep, tricep, deltoid of operative extremity.  Axillary nerve intact with deltoid firing.  Incision is healing well without evidence of infection or dehiscence.  Incision was made sure to be covered with Steri-Strips from the proximal to distal aspect of the length of the incision.  2+ radial pulse of the operative extremity.  Good subscapularis strength rated 5 -/5  Plan is discontinue sling.  Okay to very lightly lift with the operative extremity but no lifting anything heavier than a coffee cup or cell phone.  Start physical therapy to focus on passive range of motion and active range of motion with deltoid isometrics.  Do not want to externally rotate past 30 degrees to protect subscapularis repair.  Follow-up in 4 weeks for clinical recheck.   Additionally, patient has concerned about right knee pain.  He has history of right knee replacement that was done on 07/24/2022.  States that the knee will get stiff at  times and cause pain.  No fevers or chills or any change in how his incision looks.  Does note swelling at times.  Feels occasional popping sensation.  Most of his pain is localized to the lateral aspect of the knee.  No numbness or tingling or radicular pain.  No groin pain or new low back pain.  On exam of the right knee, patient has positive effusion.  He has range of motion from 2 degrees extension to about 120 degrees of knee flexion.  No calf tenderness.  Negative Homans' sign.  Patient has excellent quad and hamstring strength without weakness or pain.  Does have a popping sensation over the lateral femoral condyle with passive flexion and extension with some tenderness in the IT bursa laterally.  Impression is likely IT bursitis based on the mechanical symptoms and the area of pain.  However, does not necessarily explain the effusion that is present on exam and by ultrasound exam today.  No significant evidence of prosthetic joint infection based on his history though that cannot be necessarily excluded without further lab work.  Unfortunately with his recent shoulder replacement, too soon to do any sort of ESR/CRP/CBC for further evaluation.  Plan is to try some topical Voltaren in this area and do some IT band stretching exercises with his physical therapist and follow-up in 4 weeks for clinical recheck with Dr. August Saucer.  Follow-Up Instructions: Return in about 4 weeks (  around 04/24/2023).   Orders:  Orders Placed This Encounter  Procedures   XR Shoulder Right   No orders of the defined types were placed in this encounter.   Imaging: No results found.  PMFS History: Patient Active Problem List   Diagnosis Date Noted   Primary osteoarthritis of right shoulder 03/14/2023   Biceps tendonitis on right 03/14/2023   Shoulder fracture 03/12/2023   S/P reverse total shoulder arthroplasty, right 03/12/2023   Arthritis of right knee 07/27/2022   S/P total knee arthroplasty, right 07/24/2022    Shoulder arthritis    S/P shoulder replacement, 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   Skin tags, multiple acquired 07/07/2019   LUQ pain 07/07/2019   OA (osteoarthritis) of knee 03/16/2019   Ventral hernia s/p lap repair with mesh 06/03/2018 06/03/2018   Hemorrhoids 08/22/2016   Anemia 03/16/2016   Right shoulder pain 02/26/2016   Abnormal CT scan, gastrointestinal tract suspect angioedema    Abdominal pain    Hyperglycemia 08/21/2013   Low back pain 10/10/2012   Hyperlipidemia    Hypertension, essential     Obesity (BMI 30.0-34.9)    Past Medical History:  Diagnosis Date   Abnormal CT scan, gastrointestinal tract suspect angioedema    Anemia 03/16/2016   IRON   Anxiety    Arthritis    Back pain 10/10/2012   Fatty liver 2005   seen on 2005 ultrasound,05/2015 CT.    Hemorrhoids 08/22/2016   Hemorrhoids, internal, with bleeding 2005   internal and external. 2005 band ligation (dr Noe Gens in [facility])   Hyperglycemia 08/21/2013   Hyperlipidemia    Hypertension    Nocturia 08/21/2013   Obesity (BMI 30.0-34.9)    Osteoporosis    Pneumonia    as a child   Pre-diabetes    Rectal bleeding 07/30/2014   Renal cyst    Left Kidney   SBO (small bowel obstruction) (HCC)    Tubular adenoma of colon before 2005, 2012, 08/2014   Unspecified constipation 02/06/2014   Vitamin D deficiency     Family History  Problem Relation Age of Onset   Stroke Mother    Aneurysm Mother        brain   Arthritis Father    Liver cancer Father        liver, atomic testing in military   Gallbladder disease Father    Hyperlipidemia Sister    Hypertension Sister    Obesity Sister    Diabetes Sister    Diabetes Brother    Hyperlipidemia Sister    Hypertension Sister    Obesity Sister    Diabetes Sister    Aneurysm Brother        brain   COPD Brother    Gallbladder disease Sister        x4   Colon cancer Neg Hx    Colon polyps Neg Hx    Esophageal cancer Neg Hx    Rectal cancer Neg Hx    Stomach cancer  Neg Hx    Prostate cancer Neg Hx    CAD Neg Hx     Past Surgical History:  Procedure Laterality Date   BICEPT TENODESIS Right 03/12/2023   Procedure: BICEPS TENODESIS;  Surgeon: Cammy Copa, MD;  Location: [facility] OR;  Service: Orthopedics;  Laterality: Right;   CATARACT EXTRACTION W/ INTRAOCULAR LENS IMPLANT Bilateral    COLONOSCOPY  before 2005, 2012, 2013, 08/2014.    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   Procedure: REVERSE SHOULDER ARTHROPLASTY;  Surgeon: Cammy Copa, MD;  Location: [facility] OR;  Service: Orthopedics;  Laterality: Right;   TONSILLECTOMY  1957   TOTAL KNEE ARTHROPLASTY Right 07/24/2022   Procedure: RIGHT TOTAL KNEE ARTHROPLASTY;  Surgeon: Cammy Copa, MD;  Location: [facility] OR;  Service: Orthopedics;  Laterality: Right;   TOTAL SHOULDER ARTHROPLASTY Left 11/01/2020   Procedure: LEFT ANATOMIC SHOULDER REPLACEMENT;  Surgeon: Cammy Copa, MD;  Location: [facility] OR;  Service: Orthopedics;  Laterality: Left;   VENTRAL HERNIA REPAIR N/A 06/03/2018   Procedure: LAPAROSCOPIC VENTRAL WALL  HERNIA  REPAIR WITH MESH  ERAS PATHWAY;  Surgeon: Karie Soda, MD;  Location: [facility] ORS;  Service: General;  Laterality: N/A;   Social History   Occupational History   Occupation: Retired    Associate Professor: [employer]  Tobacco Use   Smoking status: Never   Smokeless tobacco: Never  Vaping Use   Vaping status: Never Used  Substance and Sexual Activity   Alcohol use: Not Currently    Comment: Occassionally   Drug use: No   Sexual activity: Yes

## 2023-03-30 ENCOUNTER — Encounter: Payer: Self-pay | Admitting: Physical Therapy

## 2023-03-30 ENCOUNTER — Ambulatory Visit: Payer: Medicare Other | Attending: Orthopedic Surgery | Admitting: Physical Therapy

## 2023-03-30 ENCOUNTER — Other Ambulatory Visit: Payer: Self-pay

## 2023-03-30 DIAGNOSIS — R293 Abnormal posture: Secondary | ICD-10-CM | POA: Diagnosis not present

## 2023-03-30 DIAGNOSIS — M25611 Stiffness of right shoulder, not elsewhere classified: Secondary | ICD-10-CM

## 2023-03-30 DIAGNOSIS — M7521 Bicipital tendinitis, right shoulder: Secondary | ICD-10-CM | POA: Insufficient documentation

## 2023-03-30 DIAGNOSIS — M6281 Muscle weakness (generalized): Secondary | ICD-10-CM

## 2023-03-30 DIAGNOSIS — M25511 Pain in right shoulder: Secondary | ICD-10-CM | POA: Diagnosis not present

## 2023-03-30 DIAGNOSIS — Z96611 Presence of right artificial shoulder joint: Secondary | ICD-10-CM | POA: Insufficient documentation

## 2023-03-30 DIAGNOSIS — M19011 Primary osteoarthritis, right shoulder: Secondary | ICD-10-CM | POA: Insufficient documentation

## 2023-04-01 DIAGNOSIS — H43811 Vitreous degeneration, right eye: Secondary | ICD-10-CM | POA: Diagnosis not present

## 2023-04-02 ENCOUNTER — Telehealth: Payer: Self-pay | Admitting: Orthopaedic Surgery

## 2023-04-02 ENCOUNTER — Other Ambulatory Visit: Payer: Self-pay | Admitting: Surgical

## 2023-04-02 MED ORDER — GABAPENTIN 100 MG PO CAPS: 100.0000 mg | ORAL_CAPSULE | Freq: Two times a day (BID) | ORAL | 1 refills | Status: DC

## 2023-04-02 MED ORDER — OXYCODONE HCL 5 MG PO TABS: 5.0000 mg | ORAL_TABLET | Freq: Three times a day (TID) | ORAL | 0 refills | Status: DC | PRN

## 2023-04-02 NOTE — Telephone Encounter (Signed)
Patient called. He would like refills on gabapentin and oxycodone. His call back number is [phone]

## 2023-04-02 NOTE — Telephone Encounter (Signed)
refilled 

## 2023-04-03 ENCOUNTER — Ambulatory Visit: Payer: Medicare Other | Attending: Orthopedic Surgery | Admitting: Physical Therapy

## 2023-04-03 ENCOUNTER — Encounter: Payer: Self-pay | Admitting: Physical Therapy

## 2023-04-03 DIAGNOSIS — R293 Abnormal posture: Secondary | ICD-10-CM | POA: Diagnosis not present

## 2023-04-03 DIAGNOSIS — M6281 Muscle weakness (generalized): Secondary | ICD-10-CM | POA: Diagnosis not present

## 2023-04-03 DIAGNOSIS — M25511 Pain in right shoulder: Secondary | ICD-10-CM | POA: Diagnosis not present

## 2023-04-03 DIAGNOSIS — M25611 Stiffness of right shoulder, not elsewhere classified: Secondary | ICD-10-CM | POA: Diagnosis not present

## 2023-04-03 NOTE — Therapy (Signed)
OUTPATIENT PHYSICAL THERAPY TREATMENT   Patient Name: [name] MRN: [id number] DOB:1946-03-28, 77 y.o., male Today's Date: 04/03/2023   END OF SESSION:  PT End of Session - 04/03/23 1008     Visit Number 2    Date for PT Re-Evaluation 05/25/23    Authorization Type Medicare & Tricare    PT Start Time 1012    PT Stop Time 1111    PT Time Calculation (min) 59 min    Activity Tolerance Patient tolerated treatment well    Behavior During Therapy [facility] for tasks assessed/performed              Past Medical History:  Diagnosis Date   Abnormal CT scan, gastrointestinal tract suspect angioedema    Anemia 03/16/2016   IRON   Anxiety    Arthritis    Back pain 10/10/2012   Fatty liver 2005   seen on 2005 ultrasound,05/2015 CT.    Hemorrhoids 08/22/2016   Hemorrhoids, internal, with bleeding 2005   internal and external. 2005 band ligation (dr Noe Gens in [facility])   Hyperglycemia 08/21/2013   Hyperlipidemia    Hypertension    Nocturia 08/21/2013   Obesity (BMI 30.0-34.9)    Osteoporosis    Pneumonia    as a child   Pre-diabetes    Rectal bleeding 07/30/2014   Renal cyst    Left Kidney   SBO (small bowel obstruction) (HCC)    Tubular adenoma of colon before 2005, 2012, 08/2014   Unspecified constipation 02/06/2014   Vitamin D deficiency    Past Surgical History:  Procedure Laterality Date   BICEPT TENODESIS Right 03/12/2023   Procedure: BICEPS TENODESIS;  Surgeon: Cammy Copa, MD;  Location: [facility] OR;  Service: Orthopedics;  Laterality: Right;   CATARACT EXTRACTION W/ INTRAOCULAR LENS IMPLANT Bilateral    COLONOSCOPY  before 2005, 2012, 2013, 08/2014.    ENTEROSCOPY N/A 10/05/2017   Procedure: ENTEROSCOPY;  Surgeon: Iva Boop, MD;  Location: [facility];  Service: Endoscopy;  Laterality: N/A;   FRACTURE SURGERY Left 1989   leg   HEMORRHOID BANDING  2005   IR RADIOLOGIST EVAL & MGMT  11/11/2021   KNEE SURGERY Left 2010   arthroscopy, torn carilage    KNEE SURGERY Right 2012   arthroscopy   REVERSE SHOULDER ARTHROPLASTY Right 03/12/2023   Procedure: REVERSE SHOULDER ARTHROPLASTY;  Surgeon: Cammy Copa, MD;  Location: [facility] OR;  Service: Orthopedics;  Laterality: Right;   TONSILLECTOMY  1957   TOTAL KNEE ARTHROPLASTY Right 07/24/2022   Procedure: RIGHT TOTAL KNEE ARTHROPLASTY;  Surgeon: Cammy Copa, MD;  Location: [facility] OR;  Service: Orthopedics;  Laterality: Right;   TOTAL SHOULDER ARTHROPLASTY Left 11/01/2020   Procedure: LEFT ANATOMIC SHOULDER REPLACEMENT;  Surgeon: Cammy Copa, MD;  Location: [facility] OR;  Service: Orthopedics;  Laterality: Left;   VENTRAL HERNIA REPAIR N/A 06/03/2018   Procedure: LAPAROSCOPIC VENTRAL WALL  HERNIA  REPAIR WITH MESH  ERAS PATHWAY;  Surgeon: Karie Soda, MD;  Location: [facility] ORS;  Service: General;  Laterality: N/A;   Patient Active Problem List   Diagnosis Date Noted   Primary osteoarthritis of right shoulder 03/14/2023   Biceps tendonitis on right 03/14/2023   Shoulder fracture 03/12/2023   S/P reverse total shoulder arthroplasty, right 03/12/2023   Arthritis of right knee 07/27/2022   S/P total knee arthroplasty, right 07/24/2022   Shoulder arthritis    S/P shoulder replacement, 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   Skin tags, multiple acquired 07/07/2019   LUQ pain  07/07/2019   OA (osteoarthritis) of knee 03/16/2019   Ventral hernia s/p lap repair with mesh 06/03/2018 06/03/2018   Hemorrhoids 08/22/2016   Anemia 03/16/2016   Right shoulder pain 02/26/2016   Abnormal CT scan, gastrointestinal tract suspect angioedema    Abdominal pain    Hyperglycemia 08/21/2013   Low back pain 10/10/2012   Hyperlipidemia    Hypertension, essential     Obesity (BMI 30.0-34.9)     PCP: Bradd Canary, MD   REFERRING PROVIDER: Cammy Copa, MD   REFERRING DIAG:  [phone] (ICD-10-CM) - S/P reverse total shoulder arthroplasty, right  M19.011 (ICD-10-CM) - Primary osteoarthritis of right shoulder  M75.21  (ICD-10-CM) - Biceps tendonitis on right    THERAPY DIAG:  Stiffness of right shoulder, not elsewhere classified  Acute pain of right shoulder  Abnormal posture  Muscle weakness (generalized)  RATIONALE FOR EVALUATION AND TREATMENT: Rehabilitation  ONSET DATE: 03/12/23 - R reverse TSA + biceps tenodesis  NEXT MD VISIT: 04/22/23   SUBJECTIVE:                                                                                                                                                                                                         SUBJECTIVE STATEMENT: Pt reports he has been doing the exercise and feels like it is helping the shoulder move better.  EVAL: Pt reports the PA released him from the sling as of his postop follow-up on Friday 03/27/23. He states he had the CPM at home 3x/day x 1.5 hrs for the first 2 weeks post, but now discontinued. Biggest concerns currently with opening jars and feeding himself with R hand. Still sleeping in the recliner as the bed is too uncomfortable. He has been completing distal UE HEP exercises 3x/day.  PAIN: Are you having pain? Yes: NPRS scale: 4-5/10 Pain location: R anterior shoulder  Pain description: ache Aggravating factors: raising his arm too high, working with the mouse on the computer Relieving factors: oxycodone, gabapentin and muscle relaxant, ice  PERTINENT HISTORY:  R TKA 07/23/22, L TSA 11/01/20, anxiety, arthritis, back pain, osteoporosis, prediabetes, ventral hernia repair  PRECAUTIONS: Shoulder  RED FLAGS: None  HAND DOMINANCE: Right  WEIGHT BEARING RESTRICTIONS: Yes NWB R UE  FALLS:  Has patient fallen in last 6 months? No  LIVING ENVIRONMENT: Lives with: lives with their spouse Lives in: House/apartment Stairs: Yes: Internal: 12 steps; on right going up and External: 2 steps; none Has following equipment at home: Single point cane and Walker - 2 wheeled  OCCUPATION: Retired  PLOF:  Independent and  Leisure: Golf, yard work, gym - bike    PATIENT GOALS: "Better ROM and less pain."   OBJECTIVE: (objective measures completed at initial evaluation unless otherwise dated)  DIAGNOSTIC FINDINGS:  03/12/23 - DG Right shoulder - IMPRESSION: Interval reverse right shoulder arthroplasty without evidence ofhardware failure or loosening.  PATIENT SURVEYS:  Quick Dash 70.5 / 100 = 70.5 %  COGNITION: Overall cognitive status: Within functional limits for tasks assessed     SENSATION: WFL Pt reports intermittent gnawing "toothache" feeling in his L thumb, tingling has subsided  POSTURE: rounded shoulders and forward head  UPPER EXTREMITY ROM:   Passive ROM Right eval  Shoulder flexion 100  Shoulder extension   Shoulder abduction 88  Shoulder adduction   Shoulder internal rotation   Shoulder external rotation 15  (Blank rows = not tested)  AROM assessment deferred on eval due to patient only 2 weeks postop Active ROM Right eval Left eval  Shoulder flexion    Shoulder extension    Shoulder abduction    Shoulder adduction    Shoulder internal rotation    Shoulder external rotation    Elbow flexion    Elbow extension    Wrist flexion    Wrist extension    Wrist ulnar deviation    Wrist radial deviation    Wrist pronation    Wrist supination    (Blank rows = not tested)  UPPER EXTREMITY MMT: (Deferred on eval due to only 2 weeks postop)  MMT Right eval Left eval  Shoulder flexion    Shoulder extension    Shoulder abduction    Shoulder adduction    Shoulder internal rotation    Shoulder external rotation    Middle trapezius    Lower trapezius    Elbow flexion    Elbow extension    Wrist flexion    Wrist extension    Wrist ulnar deviation    Wrist radial deviation    Wrist pronation    Wrist supination    Grip strength (lbs)    (Blank rows = not tested)  PALPATION:  Mild TTP R pecs, biceps, teres group    TODAY'S TREATMENT:   04/02/23 THERAPEUTIC  EXERCISE: to improve flexibility, strength and mobility.  Demonstration, verbal and tactile cues throughout for technique.  Seated R shoulder flexion PROM towel slide on tabletop x 10 Seated R shoulder scaption PROM towel slide on tabletop x 10 Seated B scapular retraction and depression 10 x 5" R shoulder submax (~30-50% effort) flexion isometric into wall 10 x 5" R shoulder submax (~30-50% effort) abduction isometric into wall 10 x 5" R shoulder submax (~30-50% effort) extension isometric into wall 10 x 5" R shoulder pendulums - flex/ext (vertical), horiz ABD/ADD (horizontal), CW/CCW  MANUAL THERAPY: To promote improved flexibility, improved joint mobility, increased ROM, and reduced pain. R shoulder flexion, scaption and ER PROM within limits of protocol and pain tolerance   MODALITIES: Game Ready vasopneumatic compression post session to R shoulder x 10 min, low compression, 34 to reduce post-exercise pain and swelling/edema   03/30/23 THERAPEUTIC EXERCISE: to improve flexibility, strength and mobility.  Demonstration, verbal and tactile cues throughout for technique. Supine R shoulder flexion AAROM with wand to 90 x 10 Supine R scaption AAROM with wand to 90 x 10 Seated R shoulder flexion PROM towel slide on tabletop x 10 Seated R shoulder scaption PROM towel slide on tabletop x 10 Seated B scapular retraction and depression 10 x 5"  PATIENT EDUCATION:  Education details: PT eval findings, anticipated POC, and initial HEP  Person educated: Patient Education method: Explanation, Demonstration, Tactile cues, Verbal cues, and Handouts Education comprehension: verbalized understanding, returned demonstration, verbal cues required, tactile cues required, and needs further education  HOME EXERCISE PROGRAM: Access Code: [email or web address] URL: [email or web address] Date: 04/03/2023 Prepared by: Glenetta Hew  Exercises - Seated Shoulder Flexion Towel Slide at Table Top  -  2-3 x daily - 7 x weekly - 2 sets - 10 reps - 3 sec  hold - Seated Shoulder Scaption Slide at Table Top with Forearm in Neutral  - 2-3 x daily - 7 x weekly - 2 sets - 10 reps - 3 sec hold - Seated Shoulder Setting  - 2-3 x daily - 7 x weekly - 2 sets - 10 reps - 3 sec hold - Isometric Shoulder Flexion at Wall (Mirrored)  - 1-2 x daily - 7 x weekly - 2 sets - 5 reps - 5 sec hold - Isometric Shoulder Abduction at Wall (Mirrored)  - 1-2 x daily - 7 x weekly - 2 sets - 5 reps - 5 sec hold - Isometric Shoulder Extension at Wall (Mirrored)  - 1-2 x daily - 7 x weekly - 2 sets - 5 reps - 5 sec hold - Gentle Vertical Shoulder Pendulum  - 2-3 x daily - 7 x weekly - 2 sets - 10 reps - Gentle Horizontal Shoulder Pendulum  - 2-3 x daily - 7 x weekly - 2 sets - 10 reps - Circular Shoulder Pendulum with Table Support  - 2-3 x daily - 7 x weekly - 2 sets - 10 reps   ASSESSMENT:  CLINICAL IMPRESSION: Giovani reports completing the HEP seems to help him manage the pain/soreness in his R shoulder. HEP reviewed with reminders to focus only on PROM with R shoulder avoiding any AROM at this time per the post-op rehab protocol. Good tolerance for R shoulder PROM.  Jaymes will benefit from skilled PT to address above deficits to improve mobility and activity tolerance with decreased pain interference.   OBJECTIVE IMPAIRMENTS: decreased activity tolerance, decreased coordination, decreased knowledge of condition, decreased ROM, decreased strength, increased fascial restrictions, impaired perceived functional ability, increased muscle spasms, impaired flexibility, impaired sensation, impaired UE functional use, postural dysfunction, and pain.   ACTIVITY LIMITATIONS: carrying, lifting, sleeping, bathing, dressing, reach over head, hygiene/grooming, and caring for others  PARTICIPATION LIMITATIONS: meal prep, cleaning, laundry, driving, shopping, community activity, and yard work  PERSONAL FACTORS: Past/current  experiences, Time since onset of injury/illness/exacerbation, and 3+ comorbidities: R TKA 07/23/22, L TSA 11/01/20, anxiety, arthritis, back pain, osteoporosis, prediabetes, ventral hernia repair  are also affecting patient's functional outcome.   REHAB POTENTIAL: Good  CLINICAL DECISION MAKING: Stable/uncomplicated  EVALUATION COMPLEXITY: Low   GOALS: Goals reviewed with patient? Yes  SHORT TERM GOALS: Target date: 04/27/2023  Patient will be independent with initial HEP to improve outcomes and carryover.  Baseline:  Goal status: IN PROGRESS  2.  Patient will report 25% improvement in R shoulder pain to improve tolerance for exercise and activity.  Baseline: 6/10 Goal status: IN PROGRESS  LONG TERM GOALS: Target date: 05/25/2023  Patient will be independent with ongoing/advanced HEP for self-management at home.  Baseline:  Goal status: IN PROGRESS  2.  Patient will report 75% improvement in R shoulder pain to improve QOL.  Baseline: 6/10 Goal status: IN PROGRESS  3.  Patient to demonstrate improved upright posture with posterior shoulder girdle engaged to  promote improved glenohumeral joint mobility. Baseline: Forward head and rounded shoulder posture Goal status: IN PROGRESS  4.  Patient to improve R shoulder AROM to [facility] without pain provocation to allow for increased ease of ADLs.  Baseline: Refer to above UE ROM table Goal status: IN PROGRESS  5.  Patient will demonstrate improved R shoulder strength to >/= 4 to 4+/5 for functional UE use. Baseline: Refer to above UE MMT table Goal status: IN PROGRESS  6  Patient will report </= 60% on QuickDASH to demonstrate improved functional ability (MDIC = 10).  Baseline: 70.5 / 100 = 70.5 % Goal status: IN PROGRESS  7.  Patient to report ability to perform ADLs, household, and leisure-related tasks without limitation due to R shoulder pain, LOM or weakness.   Baseline: Limited with all activities requiring use of R arm Goal  status: IN PROGRESS    PLAN:  PT FREQUENCY: 2x/week  PT DURATION: 8 weeks  PLANNED INTERVENTIONS: Therapeutic exercises, Therapeutic activity, Neuromuscular re-education, Patient/Family education, Self Care, Joint mobilization, Dry Needling, Electrical stimulation, Cryotherapy, Moist heat, Taping, Vasopneumatic device, Ultrasound, Ionotophoresis 4mg /ml Dexamethasone, Manual therapy, and Re-evaluation  PLAN FOR NEXT SESSION: Per reverse TSA protocol (Sx 03/12/23) - post-op week #3 as of 04/02/23 - gentle PROM - gradually increasing forward flexion and elevation in the scapular plane to 120; begin periscapular deltoid submaximal pain-free isometrics and scapular plane; gentle resisted exercises of elbow, wrist and hand   Marry Guan, PT 04/03/2023, 12:52 PM

## 2023-04-06 ENCOUNTER — Ambulatory Visit (INDEPENDENT_AMBULATORY_CARE_PROVIDER_SITE_OTHER): Payer: Medicare Other | Admitting: Physician Assistant

## 2023-04-06 ENCOUNTER — Encounter: Payer: Self-pay | Admitting: Physician Assistant

## 2023-04-06 VITALS — BP 115/75 | HR 81 | Ht 70.5 in | Wt 199.8 lb

## 2023-04-06 DIAGNOSIS — L209 Atopic dermatitis, unspecified: Secondary | ICD-10-CM

## 2023-04-06 DIAGNOSIS — Z23 Encounter for immunization: Secondary | ICD-10-CM

## 2023-04-06 MED ORDER — TRIAMCINOLONE ACETONIDE 0.1 % EX CREA
1.0000 | TOPICAL_CREAM | Freq: Two times a day (BID) | CUTANEOUS | 0 refills | Status: DC
Start: 2023-04-06 — End: 2023-08-12

## 2023-04-06 NOTE — Progress Notes (Signed)
Established patient visit   Patient: [name]   DOB: May 09, 1946   77 y.o. Male  MRN: [id number] Visit Date: 04/06/2023  Today's healthcare provider: Alfredia Ferguson, PA-C   Cc. Rash, right arm x 2 weeks  Subjective    HPI  Pt reports a rash on the inside of his right arm-- he had right shoulder surgery 9/12 and was in a sling for a few weeks-- even to bed. Pt reports the rash started x 2 weeks ago, while he was in the sling. This area got sweaty and irritated, and had stayed that way. He used topical cortisone with no relief.  Medications: Outpatient Medications Prior to Visit  Medication Sig   acetaminophen (TYLENOL) 325 MG tablet Take 1-2 tablets (325-650 mg total) by mouth every 6 (six) hours as needed for mild pain (pain score 1-3 or temp > 100.5).   ALPRAZolam (XANAX) 1 MG tablet TAKE 1 TABLET (1 MG TOTAL) BY MOUTH AT BEDTIME AS NEEDED FOR ANXIETY.   amLODipine (NORVASC) 10 MG tablet TAKE 1 TABLET DAILY   aspirin 81 MG chewable tablet Chew 1 tablet (81 mg total) by mouth 2 (two) times daily.   gabapentin (NEURONTIN) 100 MG capsule Take 1 capsule (100 mg total) by mouth 2 (two) times daily.   Iron, Ferrous Sulfate, 325 (65 Fe) MG TABS Take 325 mg by mouth daily.   losartan (COZAAR) 50 MG tablet TAKE 1 TABLET DAILY (NEED FURTHER EVALUATION AND/OR LABORATORY TESTING BEFORE FURTHER REFILLS GIVEN, MAKE APPOINTMENT FOR THIS) (Patient taking differently: Take 50 mg by mouth daily.)   methocarbamol (ROBAXIN) 500 MG tablet Take 1 tablet (500 mg total) by mouth every 8 (eight) hours as needed for muscle spasms.   metoprolol succinate (TOPROL-XL) 50 MG 24 hr tablet TAKE AS INSTRUCTED BY YOUR PRESCRIBER (Patient taking differently: Take 50 mg by mouth daily.)   Multiple Vitamins-Minerals (MULTIVITAMIN ADULT PO) Take 2 tablets by mouth daily.   oxyCODONE (OXY IR/ROXICODONE) 5 MG immediate release tablet Take 1 tablet (5 mg total) by mouth every 8 (eight) hours as needed for moderate  pain (pain score 4-6).   rosuvastatin (CRESTOR) 20 MG tablet Take 1 tablet (20 mg total) by mouth daily.   tamsulosin (FLOMAX) 0.4 MG CAPS capsule Take 0.4 mg by mouth daily.   No facility-administered medications prior to visit.    Review of Systems  Constitutional:  Negative for fatigue and fever.  Respiratory:  Negative for cough and shortness of breath.   Cardiovascular:  Negative for chest pain, palpitations and leg swelling.  Skin:  Positive for rash.  Neurological:  Negative for dizziness and headaches.      Objective    BP 115/75   Pulse 81   Ht 5' 10.5" (1.791 m)   Wt 199 lb 12.8 oz (90.6 kg)   SpO2 99%   BMI 28.26 kg/m   Physical Exam Vitals reviewed.  Constitutional:      Appearance: He is not ill-appearing.  HENT:     Head: Normocephalic.  Eyes:     Conjunctiva/sclera: Conjunctivae normal.  Cardiovascular:     Rate and Rhythm: Normal rate.  Pulmonary:     Effort: Pulmonary effort is normal. No respiratory distress.  Skin:    Comments: Right inner upper arm with a erythematous, slightly raised papular rash  Neurological:     General: No focal deficit present.     Mental Status: He is alert and oriented to person, place, and time.  Psychiatric:  Mood and Affect: Mood normal.        Behavior: Behavior normal.      No results found for any visits on 04/06/23.  Assessment & Plan     1. Need for influenza vaccination  - Flu Vaccine Trivalent High Dose (Fluad)  2. Atopic dermatitis, unspecified type Rx triamcinolone, from irritation/moisture in sling. Keep area clean/dry. - triamcinolone cream (KENALOG) 0.1 %; Apply 1 Application topically 2 (two) times daily.  Dispense: 45 g; Refill: 0   Return if symptoms worsen or fail to improve.      I, Alfredia Ferguson, PA-C have reviewed all documentation for this visit. The documentation on  04/06/23   for the exam, diagnosis, procedures, and orders are all accurate and complete.    Alfredia Ferguson,  PA-C  [facility] [phone] (phone) [phone] (fax)  [facility]

## 2023-04-07 ENCOUNTER — Ambulatory Visit: Payer: Medicare Other

## 2023-04-07 DIAGNOSIS — M25611 Stiffness of right shoulder, not elsewhere classified: Secondary | ICD-10-CM | POA: Diagnosis not present

## 2023-04-07 DIAGNOSIS — R293 Abnormal posture: Secondary | ICD-10-CM | POA: Diagnosis not present

## 2023-04-07 DIAGNOSIS — M6281 Muscle weakness (generalized): Secondary | ICD-10-CM | POA: Diagnosis not present

## 2023-04-07 DIAGNOSIS — M25511 Pain in right shoulder: Secondary | ICD-10-CM

## 2023-04-07 NOTE — Therapy (Signed)
OUTPATIENT PHYSICAL THERAPY TREATMENT   Patient Name: [name] MRN: [id number] DOB:March 09, 1946, 77 y.o., male Today's Date: 04/07/2023   END OF SESSION:  PT End of Session - 04/07/23 0952     Visit Number 3    Date for PT Re-Evaluation 05/25/23    Authorization Type Medicare & Tricare    PT Start Time [phone]    PT Stop Time 1020    PT Time Calculation (min) 49 min    Activity Tolerance Patient tolerated treatment well    Behavior During Therapy [facility] for tasks assessed/performed               Past Medical History:  Diagnosis Date   Abnormal CT scan, gastrointestinal tract suspect angioedema    Anemia 03/16/2016   IRON   Anxiety    Arthritis    Back pain 10/10/2012   Fatty liver 2005   seen on 2005 ultrasound,05/2015 CT.    Hemorrhoids 08/22/2016   Hemorrhoids, internal, with bleeding 2005   internal and external. 2005 band ligation (dr Noe Gens in [facility])   Hyperglycemia 08/21/2013   Hyperlipidemia    Hypertension    Nocturia 08/21/2013   Obesity (BMI 30.0-34.9)    Osteoporosis    Pneumonia    as a child   Pre-diabetes    Rectal bleeding 07/30/2014   Renal cyst    Left Kidney   SBO (small bowel obstruction) (HCC)    Tubular adenoma of colon before 2005, 2012, 08/2014   Unspecified constipation 02/06/2014   Vitamin D deficiency    Past Surgical History:  Procedure Laterality Date   BICEPT TENODESIS Right 03/12/2023   Procedure: BICEPS TENODESIS;  Surgeon: Cammy Copa, MD;  Location: [facility] OR;  Service: Orthopedics;  Laterality: Right;   CATARACT EXTRACTION W/ INTRAOCULAR LENS IMPLANT Bilateral    COLONOSCOPY  before 2005, 2012, 2013, 08/2014.    ENTEROSCOPY N/A 10/05/2017   Procedure: ENTEROSCOPY;  Surgeon: Iva Boop, MD;  Location: [facility] ENDOSCOPY;  Service: Endoscopy;  Laterality: N/A;   FRACTURE SURGERY Left 1989   leg   HEMORRHOID BANDING  2005   IR RADIOLOGIST EVAL & MGMT  11/11/2021   KNEE SURGERY Left 2010   arthroscopy, torn  carilage   KNEE SURGERY Right 2012   arthroscopy   REVERSE SHOULDER ARTHROPLASTY Right 03/12/2023   Procedure: REVERSE SHOULDER ARTHROPLASTY;  Surgeon: Cammy Copa, MD;  Location: [facility] OR;  Service: Orthopedics;  Laterality: Right;   TONSILLECTOMY  1957   TOTAL KNEE ARTHROPLASTY Right 07/24/2022   Procedure: RIGHT TOTAL KNEE ARTHROPLASTY;  Surgeon: Cammy Copa, MD;  Location: [facility] OR;  Service: Orthopedics;  Laterality: Right;   TOTAL SHOULDER ARTHROPLASTY Left 11/01/2020   Procedure: LEFT ANATOMIC SHOULDER REPLACEMENT;  Surgeon: Cammy Copa, MD;  Location: [facility] OR;  Service: Orthopedics;  Laterality: Left;   VENTRAL HERNIA REPAIR N/A 06/03/2018   Procedure: LAPAROSCOPIC VENTRAL WALL  HERNIA  REPAIR WITH MESH  ERAS PATHWAY;  Surgeon: Karie Soda, MD;  Location: [facility] ORS;  Service: General;  Laterality: N/A;   Patient Active Problem List   Diagnosis Date Noted   Primary osteoarthritis of right shoulder 03/14/2023   Biceps tendonitis on right 03/14/2023   Shoulder fracture 03/12/2023   S/P reverse total shoulder arthroplasty, right 03/12/2023   Arthritis of right knee 07/27/2022   S/P total knee arthroplasty, right 07/24/2022   Shoulder arthritis    S/P shoulder replacement, 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   Skin tags, multiple acquired 07/07/2019   LUQ  pain 07/07/2019   OA (osteoarthritis) of knee 03/16/2019   Ventral hernia s/p lap repair with mesh 06/03/2018 06/03/2018   Hemorrhoids 08/22/2016   Anemia 03/16/2016   Right shoulder pain 02/26/2016   Abnormal CT scan, gastrointestinal tract suspect angioedema    Abdominal pain    Hyperglycemia 08/21/2013   Low back pain 10/10/2012   Hyperlipidemia    Hypertension, essential     Obesity (BMI 30.0-34.9)     PCP: Bradd Canary, MD   REFERRING PROVIDER: Cammy Copa, MD   REFERRING DIAG:  [phone] (ICD-10-CM) - S/P reverse total shoulder arthroplasty, right  M19.011 (ICD-10-CM) - Primary osteoarthritis of right shoulder   M75.21 (ICD-10-CM) - Biceps tendonitis on right    THERAPY DIAG:  Stiffness of right shoulder, not elsewhere classified  Acute pain of right shoulder  Abnormal posture  Muscle weakness (generalized)  RATIONALE FOR EVALUATION AND TREATMENT: Rehabilitation  ONSET DATE: 03/12/23 - R reverse TSA + biceps tenodesis  NEXT MD VISIT: 04/22/23   SUBJECTIVE:                                                                                                                                                                                                         SUBJECTIVE STATEMENT: Pt reports his pain is better, he took Oxycodone this morning.  EVAL: Pt reports the PA released him from the sling as of his postop follow-up on Friday 03/27/23. He states he had the CPM at home 3x/day x 1.5 hrs for the first 2 weeks post, but now discontinued. Biggest concerns currently with opening jars and feeding himself with R hand. Still sleeping in the recliner as the bed is too uncomfortable. He has been completing distal UE HEP exercises 3x/day.  PAIN: Are you having pain? Yes: NPRS scale: 3-4/10 Pain location: R anterior shoulder  Pain description: ache Aggravating factors: raising his arm too high, working with the mouse on the computer Relieving factors: oxycodone, gabapentin and muscle relaxant, ice  PERTINENT HISTORY:  R TKA 07/23/22, L TSA 11/01/20, anxiety, arthritis, back pain, osteoporosis, prediabetes, ventral hernia repair  PRECAUTIONS: Shoulder  RED FLAGS: None  HAND DOMINANCE: Right  WEIGHT BEARING RESTRICTIONS: Yes NWB R UE  FALLS:  Has patient fallen in last 6 months? No  LIVING ENVIRONMENT: Lives with: lives with their spouse Lives in: House/apartment Stairs: Yes: Internal: 12 steps; on right going up and External: 2 steps; none Has following equipment at home: Single point cane and Walker - 2 wheeled  OCCUPATION: Retired  PLOF: Independent and Leisure: Golf, yard work,  gym -  bike    PATIENT GOALS: "Better ROM and less pain."   OBJECTIVE: (objective measures completed at initial evaluation unless otherwise dated)  DIAGNOSTIC FINDINGS:  03/12/23 - DG Right shoulder - IMPRESSION: Interval reverse right shoulder arthroplasty without evidence ofhardware failure or loosening.  PATIENT SURVEYS:  Quick Dash 70.5 / 100 = 70.5 %  COGNITION: Overall cognitive status: Within functional limits for tasks assessed     SENSATION: WFL Pt reports intermittent gnawing "toothache" feeling in his L thumb, tingling has subsided  POSTURE: rounded shoulders and forward head  UPPER EXTREMITY ROM:   Passive ROM Right eval  Shoulder flexion 100  Shoulder extension   Shoulder abduction 88  Shoulder adduction   Shoulder internal rotation   Shoulder external rotation 15  (Blank rows = not tested)  AROM assessment deferred on eval due to patient only 2 weeks postop Active ROM Right eval Left eval  Shoulder flexion    Shoulder extension    Shoulder abduction    Shoulder adduction    Shoulder internal rotation    Shoulder external rotation    Elbow flexion    Elbow extension    Wrist flexion    Wrist extension    Wrist ulnar deviation    Wrist radial deviation    Wrist pronation    Wrist supination    (Blank rows = not tested)  UPPER EXTREMITY MMT: (Deferred on eval due to only 2 weeks postop)  MMT Right eval Left eval  Shoulder flexion    Shoulder extension    Shoulder abduction    Shoulder adduction    Shoulder internal rotation    Shoulder external rotation    Middle trapezius    Lower trapezius    Elbow flexion    Elbow extension    Wrist flexion    Wrist extension    Wrist ulnar deviation    Wrist radial deviation    Wrist pronation    Wrist supination    Grip strength (lbs)    (Blank rows = not tested)  PALPATION:  Mild TTP R pecs, biceps, teres group    TODAY'S TREATMENT:  04/07/23 THERAPEUTIC EXERCISE: to improve flexibility, strength  and mobility.  Demonstration, verbal and tactile cues throughout for technique.  Seated R shoulder flexion PROM towel slide on tabletop x 10 Seated R shoulder scaption PROM towel slide on tabletop x 10 R shoulder submax (~30-50% effort) flexion isometric into wall 10 x 5" R shoulder submax (~30-50% effort) abduction isometric into wall 10 x 5" R shoulder submax (~30-50% effort) extension isometric into wall 10 x 5" Seated B scapular retraction and depression 10 x 5" Seated B shoulder rolls 10x5"  MANUAL THERAPY: To promote improved flexibility, improved joint mobility, increased ROM, and reduced pain. R shoulder flexion, scaption and ER PROM within limits of protocol and pain tolerance  STM to R UT/LS  Ice pack to R shoulder for 7 min   04/02/23 THERAPEUTIC EXERCISE: to improve flexibility, strength and mobility.  Demonstration, verbal and tactile cues throughout for technique.  Seated R shoulder flexion PROM towel slide on tabletop x 10 Seated R shoulder scaption PROM towel slide on tabletop x 10 Seated B scapular retraction and depression 10 x 5" R shoulder submax (~30-50% effort) flexion isometric into wall 10 x 5" R shoulder submax (~30-50% effort) abduction isometric into wall 10 x 5" R shoulder submax (~30-50% effort) extension isometric into wall 10 x 5" R shoulder pendulums - flex/ext (vertical), horiz ABD/ADD (  horizontal), CW/CCW  MANUAL THERAPY: To promote improved flexibility, improved joint mobility, increased ROM, and reduced pain. R shoulder flexion, scaption and ER PROM within limits of protocol and pain tolerance   MODALITIES: Game Ready vasopneumatic compression post session to R shoulder x 10 min, low compression, 34 to reduce post-exercise pain and swelling/edema   03/30/23 THERAPEUTIC EXERCISE: to improve flexibility, strength and mobility.  Demonstration, verbal and tactile cues throughout for technique. Supine R shoulder flexion AAROM with wand to 90 x  10 Supine R scaption AAROM with wand to 90 x 10 Seated R shoulder flexion PROM towel slide on tabletop x 10 Seated R shoulder scaption PROM towel slide on tabletop x 10 Seated B scapular retraction and depression 10 x 5"   PATIENT EDUCATION:  Education details: PT eval findings, anticipated POC, and initial HEP  Person educated: Patient Education method: Explanation, Demonstration, Tactile cues, Verbal cues, and Handouts Education comprehension: verbalized understanding, returned demonstration, verbal cues required, tactile cues required, and needs further education  HOME EXERCISE PROGRAM: Access Code: [email or web address] URL: [email or web address] Date: 04/03/2023 Prepared by: Glenetta Hew  Exercises - Seated Shoulder Flexion Towel Slide at Table Top  - 2-3 x daily - 7 x weekly - 2 sets - 10 reps - 3 sec  hold - Seated Shoulder Scaption Slide at Table Top with Forearm in Neutral  - 2-3 x daily - 7 x weekly - 2 sets - 10 reps - 3 sec hold - Seated Shoulder Setting  - 2-3 x daily - 7 x weekly - 2 sets - 10 reps - 3 sec hold - Isometric Shoulder Flexion at Wall (Mirrored)  - 1-2 x daily - 7 x weekly - 2 sets - 5 reps - 5 sec hold - Isometric Shoulder Abduction at Wall (Mirrored)  - 1-2 x daily - 7 x weekly - 2 sets - 5 reps - 5 sec hold - Isometric Shoulder Extension at Wall (Mirrored)  - 1-2 x daily - 7 x weekly - 2 sets - 5 reps - 5 sec hold - Gentle Vertical Shoulder Pendulum  - 2-3 x daily - 7 x weekly - 2 sets - 10 reps - Gentle Horizontal Shoulder Pendulum  - 2-3 x daily - 7 x weekly - 2 sets - 10 reps - Circular Shoulder Pendulum with Table Support  - 2-3 x daily - 7 x weekly - 2 sets - 10 reps   ASSESSMENT:  CLINICAL IMPRESSION: Pt responded well to treatment. He does require cues to avoid too much active use of R shoulder  at this point. Continued with PROM exercises, isometrics, and periscap exercises. Very tight in R UT/LS, edu given on using massage gun at home and  heat on UT if need be.  Quashawn will benefit from skilled PT to address above deficits to improve mobility and activity tolerance with decreased pain interference.   OBJECTIVE IMPAIRMENTS: decreased activity tolerance, decreased coordination, decreased knowledge of condition, decreased ROM, decreased strength, increased fascial restrictions, impaired perceived functional ability, increased muscle spasms, impaired flexibility, impaired sensation, impaired UE functional use, postural dysfunction, and pain.   ACTIVITY LIMITATIONS: carrying, lifting, sleeping, bathing, dressing, reach over head, hygiene/grooming, and caring for others  PARTICIPATION LIMITATIONS: meal prep, cleaning, laundry, driving, shopping, community activity, and yard work  PERSONAL FACTORS: Past/current experiences, Time since onset of injury/illness/exacerbation, and 3+ comorbidities: R TKA 07/23/22, L TSA 11/01/20, anxiety, arthritis, back pain, osteoporosis, prediabetes, ventral hernia repair  are also affecting patient's functional outcome.   REHAB POTENTIAL:  Good  CLINICAL DECISION MAKING: Stable/uncomplicated  EVALUATION COMPLEXITY: Low   GOALS: Goals reviewed with patient? Yes  SHORT TERM GOALS: Target date: 04/27/2023  Patient will be independent with initial HEP to improve outcomes and carryover.  Baseline:  Goal status: IN PROGRESS  2.  Patient will report 25% improvement in R shoulder pain to improve tolerance for exercise and activity.  Baseline: 6/10 Goal status: IN PROGRESS  LONG TERM GOALS: Target date: 05/25/2023  Patient will be independent with ongoing/advanced HEP for self-management at home.  Baseline:  Goal status: IN PROGRESS  2.  Patient will report 75% improvement in R shoulder pain to improve QOL.  Baseline: 6/10 Goal status: IN PROGRESS  3.  Patient to demonstrate improved upright posture with posterior shoulder girdle engaged to promote improved glenohumeral joint mobility. Baseline:  Forward head and rounded shoulder posture Goal status: IN PROGRESS  4.  Patient to improve R shoulder AROM to [facility] without pain provocation to allow for increased ease of ADLs.  Baseline: Refer to above UE ROM table Goal status: IN PROGRESS  5.  Patient will demonstrate improved R shoulder strength to >/= 4 to 4+/5 for functional UE use. Baseline: Refer to above UE MMT table Goal status: IN PROGRESS  6  Patient will report </= 60% on QuickDASH to demonstrate improved functional ability (MDIC = 10).  Baseline: 70.5 / 100 = 70.5 % Goal status: IN PROGRESS  7.  Patient to report ability to perform ADLs, household, and leisure-related tasks without limitation due to R shoulder pain, LOM or weakness.   Baseline: Limited with all activities requiring use of R arm Goal status: IN PROGRESS    PLAN:  PT FREQUENCY: 2x/week  PT DURATION: 8 weeks  PLANNED INTERVENTIONS: Therapeutic exercises, Therapeutic activity, Neuromuscular re-education, Patient/Family education, Self Care, Joint mobilization, Dry Needling, Electrical stimulation, Cryotherapy, Moist heat, Taping, Vasopneumatic device, Ultrasound, Ionotophoresis 4mg /ml Dexamethasone, Manual therapy, and Re-evaluation  PLAN FOR NEXT SESSION: Per reverse TSA protocol (Sx 03/12/23) - post-op week #4 as of 04/09/23 - gentle PROM - gradually increasing forward flexion and elevation in the scapular plane to 120; begin periscapular deltoid submaximal pain-free isometrics and scapular plane; gentle resisted exercises of elbow, wrist and hand   Callyn Severtson L Gillis Boardley, PTA 04/07/2023, 11:13 AM

## 2023-04-10 ENCOUNTER — Ambulatory Visit: Payer: Medicare Other | Admitting: Physical Therapy

## 2023-04-10 ENCOUNTER — Encounter: Payer: Self-pay | Admitting: Physical Therapy

## 2023-04-10 DIAGNOSIS — M25611 Stiffness of right shoulder, not elsewhere classified: Secondary | ICD-10-CM

## 2023-04-10 DIAGNOSIS — R293 Abnormal posture: Secondary | ICD-10-CM | POA: Diagnosis not present

## 2023-04-10 DIAGNOSIS — M6281 Muscle weakness (generalized): Secondary | ICD-10-CM

## 2023-04-10 DIAGNOSIS — M25511 Pain in right shoulder: Secondary | ICD-10-CM

## 2023-04-10 NOTE — Therapy (Signed)
OUTPATIENT PHYSICAL THERAPY TREATMENT   Patient Name: [name] MRN: [id number] DOB:[birth year], 77 y.o., male Today's Date: 04/10/2023   END OF SESSION:  PT End of Session - 04/10/23 0931     Visit Number 4    Date for PT Re-Evaluation 05/25/23    Authorization Type Medicare & Tricare    PT Start Time 0931    PT Stop Time 1026    PT Time Calculation (min) 55 min    Activity Tolerance Patient tolerated treatment well    Behavior During Therapy [facility] for tasks assessed/performed               Past Medical History:  Diagnosis Date   Abnormal CT scan, gastrointestinal tract suspect angioedema    Anemia 03/16/2016   IRON   Anxiety    Arthritis    Back pain 10/10/2012   Fatty liver 2005   seen on 2005 ultrasound,05/2015 CT.    Hemorrhoids 08/22/2016   Hemorrhoids, internal, with bleeding 2005   internal and external. 2005 band ligation (dr Noe Gens in [facility])   Hyperglycemia 08/21/2013   Hyperlipidemia    Hypertension    Nocturia 08/21/2013   Obesity (BMI 30.0-34.9)    Osteoporosis    Pneumonia    as a child   Pre-diabetes    Rectal bleeding 07/30/2014   Renal cyst    Left Kidney   SBO (small bowel obstruction) (HCC)    Tubular adenoma of colon before 2005, 2012, 08/2014   Unspecified constipation 02/06/2014   Vitamin D deficiency    Past Surgical History:  Procedure Laterality Date   BICEPT TENODESIS Right 03/12/2023   Procedure: BICEPS TENODESIS;  Surgeon: Cammy Copa, MD;  Location: [facility] OR;  Service: Orthopedics;  Laterality: Right;   CATARACT EXTRACTION W/ INTRAOCULAR LENS IMPLANT Bilateral    COLONOSCOPY  before 2005, 2012, 2013, 08/2014.    ENTEROSCOPY N/A 10/05/2017   Procedure: ENTEROSCOPY;  Surgeon: Iva Boop, MD;  Location: [facility] ENDOSCOPY;  Service: Endoscopy;  Laterality: N/A;   FRACTURE SURGERY Left 1989   leg   HEMORRHOID BANDING  2005   IR RADIOLOGIST EVAL & MGMT  11/11/2021   KNEE SURGERY Left 2010   arthroscopy, torn  carilage   KNEE SURGERY Right 2012   arthroscopy   REVERSE SHOULDER ARTHROPLASTY Right 03/12/2023   Procedure: REVERSE SHOULDER ARTHROPLASTY;  Surgeon: Cammy Copa, MD;  Location: [facility] OR;  Service: Orthopedics;  Laterality: Right;   TONSILLECTOMY  1957   TOTAL KNEE ARTHROPLASTY Right 07/24/2022   Procedure: RIGHT TOTAL KNEE ARTHROPLASTY;  Surgeon: Cammy Copa, MD;  Location: [facility] OR;  Service: Orthopedics;  Laterality: Right;   TOTAL SHOULDER ARTHROPLASTY Left 11/01/2020   Procedure: LEFT ANATOMIC SHOULDER REPLACEMENT;  Surgeon: Cammy Copa, MD;  Location: [facility] OR;  Service: Orthopedics;  Laterality: Left;   VENTRAL HERNIA REPAIR N/A 06/03/2018   Procedure: LAPAROSCOPIC VENTRAL WALL  HERNIA  REPAIR WITH MESH  ERAS PATHWAY;  Surgeon: Karie Soda, MD;  Location: [facility] ORS;  Service: General;  Laterality: N/A;   Patient Active Problem List   Diagnosis Date Noted   Primary osteoarthritis of right shoulder 03/14/2023   Biceps tendonitis on right 03/14/2023   Shoulder fracture 03/12/2023   S/P reverse total shoulder arthroplasty, right 03/12/2023   Arthritis of right knee 07/27/2022   S/P total knee arthroplasty, right 07/24/2022   Shoulder arthritis    S/P shoulder replacement, 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   Skin tags, multiple acquired 07/07/2019   LUQ  pain 07/07/2019   OA (osteoarthritis) of knee 03/16/2019   Ventral hernia s/p lap repair with mesh 06/03/2018 06/03/2018   Hemorrhoids 08/22/2016   Anemia 03/16/2016   Right shoulder pain 02/26/2016   Abnormal CT scan, gastrointestinal tract suspect angioedema    Abdominal pain    Hyperglycemia 08/21/2013   Low back pain 10/10/2012   Hyperlipidemia    Hypertension, essential     Obesity (BMI 30.0-34.9)     PCP: Bradd Canary, MD   REFERRING PROVIDER: Cammy Copa, MD   REFERRING DIAG:  [phone] (ICD-10-CM) - S/P reverse total shoulder arthroplasty, right  M19.011 (ICD-10-CM) - Primary osteoarthritis of right shoulder   M75.21 (ICD-10-CM) - Biceps tendonitis on right    THERAPY DIAG:  Stiffness of right shoulder, not elsewhere classified  Acute pain of right shoulder  Abnormal posture  Muscle weakness (generalized)  RATIONALE FOR EVALUATION AND TREATMENT: Rehabilitation  ONSET DATE: 03/12/23 - R reverse TSA + biceps tenodesis  NEXT MD VISIT: 04/22/23   SUBJECTIVE:                                                                                                                                                                                                         SUBJECTIVE STATEMENT: Pt reports he is completing the HEP 2x/day, but otherwise not doing much with his R arm. He notes better ability to eat with his R hand as well as brush his teeth and can reach across his body better. Shoulder just feels tight and ache seems to come from the tightness.  EVAL: Pt reports the PA released him from the sling as of his postop follow-up on Friday 03/27/23. He states he had the CPM at home 3x/day x 1.5 hrs for the first 2 weeks post, but now discontinued. Biggest concerns currently with opening jars and feeding himself with R hand. Still sleeping in the recliner as the bed is too uncomfortable. He has been completing distal UE HEP exercises 3x/day.  PAIN: Are you having pain? Yes: NPRS scale: 3-4/10 Pain location: R anterior shoulder  Pain description: ache Aggravating factors: raising his arm too high, working with the mouse on the computer Relieving factors: oxycodone, gabapentin and muscle relaxant, ice  PERTINENT HISTORY:  R TKA 07/23/22, L TSA 11/01/20, anxiety, arthritis, back pain, osteoporosis, prediabetes, ventral hernia repair  PRECAUTIONS: Shoulder  RED FLAGS: None  HAND DOMINANCE: Right  WEIGHT BEARING RESTRICTIONS: Yes NWB R UE  FALLS:  Has patient fallen in last 6 months? No  LIVING ENVIRONMENT: Lives with: lives with their spouse  Lives in: House/apartment Stairs: Yes: Internal: 12  steps; on right going up and External: 2 steps; none Has following equipment at home: Single point cane and Walker - 2 wheeled  OCCUPATION: Retired  PLOF: Independent and Leisure: Golf, yard work, gym - bike    PATIENT GOALS: "Better ROM and less pain."   OBJECTIVE: (objective measures completed at initial evaluation unless otherwise dated)  DIAGNOSTIC FINDINGS:  03/12/23 - DG Right shoulder - IMPRESSION: Interval reverse right shoulder arthroplasty without evidence ofhardware failure or loosening.  PATIENT SURVEYS:  Quick Dash 70.5 / 100 = 70.5 %  COGNITION: Overall cognitive status: Within functional limits for tasks assessed     SENSATION: WFL Pt reports intermittent gnawing "toothache" feeling in his L thumb, tingling has subsided  POSTURE: rounded shoulders and forward head  UPPER EXTREMITY ROM:   Passive ROM Right eval R 04/10/23  Shoulder flexion 100 120  Shoulder extension    Shoulder abduction 88 108  Shoulder adduction    Shoulder internal rotation    Shoulder external rotation 15 23  (Blank rows = not tested)  AROM assessment deferred on eval due to patient only 2 weeks postop Active ROM Right eval Left eval  Shoulder flexion    Shoulder extension    Shoulder abduction    Shoulder adduction    Shoulder internal rotation    Shoulder external rotation    Elbow flexion    Elbow extension    Wrist flexion    Wrist extension    Wrist ulnar deviation    Wrist radial deviation    Wrist pronation    Wrist supination    (Blank rows = not tested)  UPPER EXTREMITY MMT: (Deferred on eval due to only 2 weeks postop)  MMT Right eval Left eval  Shoulder flexion    Shoulder extension    Shoulder abduction    Shoulder adduction    Shoulder internal rotation    Shoulder external rotation    Middle trapezius    Lower trapezius    Elbow flexion    Elbow extension    Wrist flexion    Wrist extension    Wrist ulnar deviation    Wrist radial deviation     Wrist pronation    Wrist supination    Grip strength (lbs)    (Blank rows = not tested)  PALPATION:  Mild TTP R pecs, biceps, teres group    TODAY'S TREATMENT:   04/10/23 THERAPEUTIC EXERCISE: to improve flexibility, strength and mobility.  Demonstration, verbal and tactile cues throughout for technique.  Seated B scapular retraction and depression 10 x 5" Seated B shoulder rolls 10x5" Seated R shoulder flexion PROM leaning forward with hand on swiffer post supporting arm 2 x 10 Seated R shoulder scaption PROM leaning diagonal forward with hand on swiffer post supporting arm 2 x 10  MANUAL THERAPY: To promote improved flexibility, improved joint mobility, increased ROM, and reduced pain. R shoulder flexion, scaption and ER PROM within limits of protocol and pain tolerance  STM to R teres group, subscapularis, lats, lateral & posterior deltoids  MODALITIES: Game Ready vasopneumatic compression post session to R shoulder x 10 min, low compression, 34 to reduce post-exercise pain and swelling/edema   04/07/23 THERAPEUTIC EXERCISE: to improve flexibility, strength and mobility.  Demonstration, verbal and tactile cues throughout for technique.  Seated R shoulder flexion PROM towel slide on tabletop x 10 Seated R shoulder scaption PROM towel slide on tabletop x 10 R shoulder submax (~30-50% effort)  flexion isometric into wall 10 x 5" R shoulder submax (~30-50% effort) abduction isometric into wall 10 x 5" R shoulder submax (~30-50% effort) extension isometric into wall 10 x 5" Seated B scapular retraction and depression 10 x 5" Seated B shoulder rolls 10x5"  MANUAL THERAPY: To promote improved flexibility, improved joint mobility, increased ROM, and reduced pain. R shoulder flexion, scaption and ER PROM within limits of protocol and pain tolerance  STM to R UT/LS  Ice pack to R shoulder for 7 min    04/02/23 THERAPEUTIC EXERCISE: to improve flexibility, strength and mobility.   Demonstration, verbal and tactile cues throughout for technique.  Seated R shoulder flexion PROM towel slide on tabletop x 10 Seated R shoulder scaption PROM towel slide on tabletop x 10 Seated B scapular retraction and depression 10 x 5" R shoulder submax (~30-50% effort) flexion isometric into wall 10 x 5" R shoulder submax (~30-50% effort) abduction isometric into wall 10 x 5" R shoulder submax (~30-50% effort) extension isometric into wall 10 x 5" R shoulder pendulums - flex/ext (vertical), horiz ABD/ADD (horizontal), CW/CCW  MANUAL THERAPY: To promote improved flexibility, improved joint mobility, increased ROM, and reduced pain. R shoulder flexion, scaption and ER PROM within limits of protocol and pain tolerance   MODALITIES: Game Ready vasopneumatic compression post session to R shoulder x 10 min, low compression, 34 to reduce post-exercise pain and swelling/edema   PATIENT EDUCATION:  Education details: progress with PT and ongoing PT POC  Person educated: Patient Education method: Explanation Education comprehension: verbalized understanding  HOME EXERCISE PROGRAM: Access Code: [email or web address] URL: [email or web address] Date: 04/03/2023 Prepared by: Glenetta Hew  Exercises - Seated Shoulder Flexion Towel Slide at Table Top  - 2-3 x daily - 7 x weekly - 2 sets - 10 reps - 3 sec  hold - Seated Shoulder Scaption Slide at Table Top with Forearm in Neutral  - 2-3 x daily - 7 x weekly - 2 sets - 10 reps - 3 sec hold - Seated Shoulder Setting  - 2-3 x daily - 7 x weekly - 2 sets - 10 reps - 3 sec hold - Isometric Shoulder Flexion at Wall (Mirrored)  - 1-2 x daily - 7 x weekly - 2 sets - 5 reps - 5 sec hold - Isometric Shoulder Abduction at Wall (Mirrored)  - 1-2 x daily - 7 x weekly - 2 sets - 5 reps - 5 sec hold - Isometric Shoulder Extension at Wall (Mirrored)  - 1-2 x daily - 7 x weekly - 2 sets - 5 reps - 5 sec hold - Gentle Vertical Shoulder Pendulum  - 2-3 x daily  - 7 x weekly - 2 sets - 10 reps - Gentle Horizontal Shoulder Pendulum  - 2-3 x daily - 7 x weekly - 2 sets - 10 reps - Circular Shoulder Pendulum with Table Support  - 2-3 x daily - 7 x weekly - 2 sets - 10 reps   ASSESSMENT:  CLINICAL IMPRESSION: Jemell reports mild discomfort due to tightness in R shoulder but states pain not too bad. He notes improving functional use of R hand with eating and brushing his teeth. Good tolerance for exercises/PROM with current R shoulder PROM improved by ~20 in flexion and scaption and 8 in ER. He notes feeling of restriction in posterior shoulder with attempts at [facility] PROM - better after MT to posterior shoulder complex.  Huxlee is progressing well per his rehab protocol and goals and will benefit from continued  skilled PT to address ongoing deficits to improve mobility and activity tolerance with decreased pain interference.   OBJECTIVE IMPAIRMENTS: decreased activity tolerance, decreased coordination, decreased knowledge of condition, decreased ROM, decreased strength, increased fascial restrictions, impaired perceived functional ability, increased muscle spasms, impaired flexibility, impaired sensation, impaired UE functional use, postural dysfunction, and pain.   ACTIVITY LIMITATIONS: carrying, lifting, sleeping, bathing, dressing, reach over head, hygiene/grooming, and caring for others  PARTICIPATION LIMITATIONS: meal prep, cleaning, laundry, driving, shopping, community activity, and yard work  PERSONAL FACTORS: Past/current experiences, Time since onset of injury/illness/exacerbation, and 3+ comorbidities: R TKA 07/23/22, L TSA 11/01/20, anxiety, arthritis, back pain, osteoporosis, prediabetes, ventral hernia repair  are also affecting patient's functional outcome.   REHAB POTENTIAL: Good  CLINICAL DECISION MAKING: Stable/uncomplicated  EVALUATION COMPLEXITY: Low   GOALS: Goals reviewed with patient? Yes  SHORT TERM GOALS: Target date:  04/27/2023  Patient will be independent with initial HEP to improve outcomes and carryover.  Baseline:  Goal status: MET - 04/10/23  2.  Patient will report 25% improvement in R shoulder pain to improve tolerance for exercise and activity.  Baseline: 6/10 Goal status: MET - 04/10/23 - Pain 3-4/10  LONG TERM GOALS: Target date: 05/25/2023  Patient will be independent with ongoing/advanced HEP for self-management at home.  Baseline:  Goal status: IN PROGRESS  2.  Patient will report 75% improvement in R shoulder pain to improve QOL.  Baseline: 6/10 Goal status: IN PROGRESS  3.  Patient to demonstrate improved upright posture with posterior shoulder girdle engaged to promote improved glenohumeral joint mobility. Baseline: Forward head and rounded shoulder posture Goal status: IN PROGRESS  4.  Patient to improve R shoulder AROM to [facility] without pain provocation to allow for increased ease of ADLs.  Baseline: Refer to above UE ROM table Goal status: IN PROGRESS  5.  Patient will demonstrate improved R shoulder strength to >/= 4 to 4+/5 for functional UE use. Baseline: Refer to above UE MMT table Goal status: IN PROGRESS  6  Patient will report </= 60% on QuickDASH to demonstrate improved functional ability (MDIC = 10).  Baseline: 70.5 / 100 = 70.5 % Goal status: IN PROGRESS  7.  Patient to report ability to perform ADLs, household, and leisure-related tasks without limitation due to R shoulder pain, LOM or weakness.   Baseline: Limited with all activities requiring use of R arm Goal status: IN PROGRESS    PLAN:  PT FREQUENCY: 2x/week  PT DURATION: 8 weeks  PLANNED INTERVENTIONS: Therapeutic exercises, Therapeutic activity, Neuromuscular re-education, Patient/Family education, Self Care, Joint mobilization, Dry Needling, Electrical stimulation, Cryotherapy, Moist heat, Taping, Vasopneumatic device, Ultrasound, Ionotophoresis 4mg /ml Dexamethasone, Manual therapy, and  Re-evaluation  PLAN FOR NEXT SESSION: Per reverse TSA protocol (Sx 03/12/23) - post-op week #4 as of 04/09/23 - gentle PROM - gradually increasing forward flexion and elevation in the scapular plane to 120; begin periscapular deltoid submaximal pain-free isometrics and scapular plane; gentle resisted exercises of elbow, wrist and hand   Marry Guan, PT 04/10/2023, 1:20 PM

## 2023-04-13 ENCOUNTER — Ambulatory Visit: Payer: Medicare Other

## 2023-04-13 DIAGNOSIS — M25511 Pain in right shoulder: Secondary | ICD-10-CM | POA: Diagnosis not present

## 2023-04-13 DIAGNOSIS — M6281 Muscle weakness (generalized): Secondary | ICD-10-CM

## 2023-04-13 DIAGNOSIS — M25611 Stiffness of right shoulder, not elsewhere classified: Secondary | ICD-10-CM | POA: Diagnosis not present

## 2023-04-13 DIAGNOSIS — R293 Abnormal posture: Secondary | ICD-10-CM

## 2023-04-13 NOTE — Therapy (Signed)
OUTPATIENT PHYSICAL THERAPY TREATMENT   Patient Name: [name] MRN: [id number] DOB:11-10-1945, 77 y.o., male Today's Date: 04/13/2023   END OF SESSION:  PT End of Session - 04/13/23 1021     Visit Number 5    Date for PT Re-Evaluation 05/25/23    Authorization Type Medicare & Tricare    PT Start Time 1017    PT Stop Time 1100    PT Time Calculation (min) 43 min    Activity Tolerance Patient tolerated treatment well    Behavior During Therapy [facility] for tasks assessed/performed                Past Medical History:  Diagnosis Date   Abnormal CT scan, gastrointestinal tract suspect angioedema    Anemia 03/16/2016   IRON   Anxiety    Arthritis    Back pain 10/10/2012   Fatty liver 2005   seen on 2005 ultrasound,05/2015 CT.    Hemorrhoids 08/22/2016   Hemorrhoids, internal, with bleeding 2005   internal and external. 2005 band ligation (dr Noe Gens in [facility]   Hyperglycemia 08/21/2013   Hyperlipidemia    Hypertension    Nocturia 08/21/2013   Obesity (BMI 30.0-34.9)    Osteoporosis    Pneumonia    as a child   Pre-diabetes    Rectal bleeding 07/30/2014   Renal cyst    Left Kidney   SBO (small bowel obstruction) (HCC)    Tubular adenoma of colon before 2005, 2012, 08/2014   Unspecified constipation 02/06/2014   Vitamin D deficiency    Past Surgical History:  Procedure Laterality Date   BICEPT TENODESIS Right 03/12/2023   Procedure: BICEPS TENODESIS;  Surgeon: Cammy Copa, MD;  Location: [facility] OR;  Service: Orthopedics;  Laterality: Right;   CATARACT EXTRACTION W/ INTRAOCULAR LENS IMPLANT Bilateral    COLONOSCOPY  before 2005, 2012, 2013, 08/2014.    ENTEROSCOPY N/A 10/05/2017   Procedure: ENTEROSCOPY;  Surgeon: Iva Boop, MD;  Location: [facility] ENDOSCOPY;  Service: Endoscopy;  Laterality: N/A;   FRACTURE SURGERY Left 1989   leg   HEMORRHOID BANDING  2005   IR RADIOLOGIST EVAL & MGMT  11/11/2021   KNEE SURGERY Left 2010   arthroscopy, torn  carilage   KNEE SURGERY Right 2012   arthroscopy   REVERSE SHOULDER ARTHROPLASTY Right 03/12/2023   Procedure: REVERSE SHOULDER ARTHROPLASTY;  Surgeon: Cammy Copa, MD;  Location: [facility] OR;  Service: Orthopedics;  Laterality: Right;   TONSILLECTOMY  1957   TOTAL KNEE ARTHROPLASTY Right 07/24/2022   Procedure: RIGHT TOTAL KNEE ARTHROPLASTY;  Surgeon: Cammy Copa, MD;  Location: [facility] OR;  Service: Orthopedics;  Laterality: Right;   TOTAL SHOULDER ARTHROPLASTY Left 11/01/2020   Procedure: LEFT ANATOMIC SHOULDER REPLACEMENT;  Surgeon: Cammy Copa, MD;  Location: [facility] OR;  Service: Orthopedics;  Laterality: Left;   VENTRAL HERNIA REPAIR N/A 06/03/2018   Procedure: LAPAROSCOPIC VENTRAL WALL  HERNIA  REPAIR WITH MESH  ERAS PATHWAY;  Surgeon: Karie Soda, MD;  Location: [facility] ORS;  Service: General;  Laterality: N/A;   Patient Active Problem List   Diagnosis Date Noted   Primary osteoarthritis of right shoulder 03/14/2023   Biceps tendonitis on right 03/14/2023   Shoulder fracture 03/12/2023   S/P reverse total shoulder arthroplasty, right 03/12/2023   Arthritis of right knee 07/27/2022   S/P total knee arthroplasty, right 07/24/2022   Shoulder arthritis    S/P shoulder replacement, 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   Skin tags, multiple acquired 07/07/2019  LUQ pain 07/07/2019   OA (osteoarthritis) of knee 03/16/2019   Ventral hernia s/p lap repair with mesh 06/03/2018 06/03/2018   Hemorrhoids 08/22/2016   Anemia 03/16/2016   Right shoulder pain 02/26/2016   Abnormal CT scan, gastrointestinal tract suspect angioedema    Abdominal pain    Hyperglycemia 08/21/2013   Low back pain 10/10/2012   Hyperlipidemia    Hypertension, essential     Obesity (BMI 30.0-34.9)     PCP: Bradd Canary, MD   REFERRING PROVIDER: Cammy Copa, MD   REFERRING DIAG:  [phone] (ICD-10-CM) - S/P reverse total shoulder arthroplasty, right  M19.011 (ICD-10-CM) - Primary osteoarthritis of right shoulder   M75.21 (ICD-10-CM) - Biceps tendonitis on right    THERAPY DIAG:  Stiffness of right shoulder, not elsewhere classified  Acute pain of right shoulder  Abnormal posture  Muscle weakness (generalized)  RATIONALE FOR EVALUATION AND TREATMENT: Rehabilitation  ONSET DATE: 03/12/23 - R reverse TSA + biceps tenodesis  NEXT MD VISIT: 04/22/23   SUBJECTIVE:                                                                                                                                                                                                         SUBJECTIVE STATEMENT: Pt reports his shoulder feels good, it hurt more yesterday but today is better. Shoulder just feels really stiff but after exercises it goes away.  EVAL: Pt reports the PA released him from the sling as of his postop follow-up on Friday 03/27/23. He states he had the CPM at home 3x/day x 1.5 hrs for the first 2 weeks post, but now discontinued. Biggest concerns currently with opening jars and feeding himself with R hand. Still sleeping in the recliner as the bed is too uncomfortable. He has been completing distal UE HEP exercises 3x/day.  PAIN: Are you having pain? Yes: NPRS scale: 0/10 Pain location: R anterior shoulder  Pain description: ache Aggravating factors: raising his arm too high, working with the mouse on the computer Relieving factors: oxycodone, gabapentin and muscle relaxant, ice  PERTINENT HISTORY:  R TKA 07/23/22, L TSA 11/01/20, anxiety, arthritis, back pain, osteoporosis, prediabetes, ventral hernia repair  PRECAUTIONS: Shoulder  RED FLAGS: None  HAND DOMINANCE: Right  WEIGHT BEARING RESTRICTIONS: Yes NWB R UE  FALLS:  Has patient fallen in last 6 months? No  LIVING ENVIRONMENT: Lives with: lives with their spouse Lives in: House/apartment Stairs: Yes: Internal: 12 steps; on right going up and External: 2 steps; none Has following equipment at home: Single point cane and  Walker - 2  wheeled  OCCUPATION: Retired  PLOF: Independent and Leisure: Golf, yard work, gym - bike    PATIENT GOALS: "Better ROM and less pain."   OBJECTIVE: (objective measures completed at initial evaluation unless otherwise dated)  DIAGNOSTIC FINDINGS:  03/12/23 - DG Right shoulder - IMPRESSION: Interval reverse right shoulder arthroplasty without evidence ofhardware failure or loosening.  PATIENT SURVEYS:  Quick Dash 70.5 / 100 = 70.5 %  COGNITION: Overall cognitive status: Within functional limits for tasks assessed     SENSATION: WFL Pt reports intermittent gnawing "toothache" feeling in his L thumb, tingling has subsided  POSTURE: rounded shoulders and forward head  UPPER EXTREMITY ROM:   Passive ROM Right eval R 04/10/23  Shoulder flexion 100 120  Shoulder extension    Shoulder abduction 88 108  Shoulder adduction    Shoulder internal rotation    Shoulder external rotation 15 23  (Blank rows = not tested)  AROM assessment deferred on eval due to patient only 2 weeks postop Active ROM Right eval Left eval  Shoulder flexion    Shoulder extension    Shoulder abduction    Shoulder adduction    Shoulder internal rotation    Shoulder external rotation    Elbow flexion    Elbow extension    Wrist flexion    Wrist extension    Wrist ulnar deviation    Wrist radial deviation    Wrist pronation    Wrist supination    (Blank rows = not tested)  UPPER EXTREMITY MMT: (Deferred on eval due to only 2 weeks postop)  MMT Right eval Left eval  Shoulder flexion    Shoulder extension    Shoulder abduction    Shoulder adduction    Shoulder internal rotation    Shoulder external rotation    Middle trapezius    Lower trapezius    Elbow flexion    Elbow extension    Wrist flexion    Wrist extension    Wrist ulnar deviation    Wrist radial deviation    Wrist pronation    Wrist supination    Grip strength (lbs)    (Blank rows = not tested)  PALPATION:  Mild TTP R  pecs, biceps, teres group    TODAY'S TREATMENT:  04/13/23 THERAPEUTIC EXERCISE: to improve flexibility, strength and mobility.  Demonstration, verbal and tactile cues throughout for technique.  Seated table slide with green x 10 flexion Seated table slide with green x 10 abduction  MANUAL THERAPY: To promote improved flexibility, improved joint mobility, increased ROM, and reduced pain. R shoulder flexion, scaption and ER PROM within limits of protocol and pain tolerance  STM to R teres group, UT/LS  04/10/23 THERAPEUTIC EXERCISE: to improve flexibility, strength and mobility.  Demonstration, verbal and tactile cues throughout for technique.  Seated B scapular retraction and depression 10 x 5" Seated B shoulder rolls 10x5" Seated R shoulder flexion PROM leaning forward with hand on swiffer post supporting arm 2 x 10 Seated R shoulder scaption PROM leaning diagonal forward with hand on swiffer post supporting arm 2 x 10  MANUAL THERAPY: To promote improved flexibility, improved joint mobility, increased ROM, and reduced pain. R shoulder flexion, scaption and ER PROM within limits of protocol and pain tolerance  STM to R teres group, subscapularis, lats, lateral & posterior deltoids  MODALITIES: Game Ready vasopneumatic compression post session to R shoulder x 10 min, low compression, 34 to reduce post-exercise pain and swelling/edema   04/07/23  THERAPEUTIC EXERCISE: to improve flexibility, strength and mobility.  Demonstration, verbal and tactile cues throughout for technique.  Seated R shoulder flexion PROM towel slide on tabletop x 10 Seated R shoulder scaption PROM towel slide on tabletop x 10 R shoulder submax (~30-50% effort) flexion isometric into wall 10 x 5" R shoulder submax (~30-50% effort) abduction isometric into wall 10 x 5" R shoulder submax (~30-50% effort) extension isometric into wall 10 x 5" Seated B scapular retraction and depression 10 x 5" Seated B shoulder  rolls 10x5"  MANUAL THERAPY: To promote improved flexibility, improved joint mobility, increased ROM, and reduced pain. R shoulder flexion, scaption and ER PROM within limits of protocol and pain tolerance  STM to R UT/LS  Ice pack to R shoulder for 7 min    04/02/23 THERAPEUTIC EXERCISE: to improve flexibility, strength and mobility.  Demonstration, verbal and tactile cues throughout for technique.  Seated R shoulder flexion PROM towel slide on tabletop x 10 Seated R shoulder scaption PROM towel slide on tabletop x 10 Seated B scapular retraction and depression 10 x 5" R shoulder submax (~30-50% effort) flexion isometric into wall 10 x 5" R shoulder submax (~30-50% effort) abduction isometric into wall 10 x 5" R shoulder submax (~30-50% effort) extension isometric into wall 10 x 5" R shoulder pendulums - flex/ext (vertical), horiz ABD/ADD (horizontal), CW/CCW  MANUAL THERAPY: To promote improved flexibility, improved joint mobility, increased ROM, and reduced pain. R shoulder flexion, scaption and ER PROM within limits of protocol and pain tolerance   MODALITIES: Game Ready vasopneumatic compression post session to R shoulder x 10 min, low compression, 34 to reduce post-exercise pain and swelling/edema   PATIENT EDUCATION:  Education details: progress with PT and ongoing PT POC  Person educated: Patient Education method: Explanation Education comprehension: verbalized understanding  HOME EXERCISE PROGRAM: Access Code: [email or web address] URL: [email or web address] Date: 04/03/2023 Prepared by: Glenetta Hew  Exercises - Seated Shoulder Flexion Towel Slide at Table Top  - 2-3 x daily - 7 x weekly - 2 sets - 10 reps - 3 sec  hold - Seated Shoulder Scaption Slide at Table Top with Forearm in Neutral  - 2-3 x daily - 7 x weekly - 2 sets - 10 reps - 3 sec hold - Seated Shoulder Setting  - 2-3 x daily - 7 x weekly - 2 sets - 10 reps - 3 sec hold - Isometric Shoulder Flexion at  Wall (Mirrored)  - 1-2 x daily - 7 x weekly - 2 sets - 5 reps - 5 sec hold - Isometric Shoulder Abduction at Wall (Mirrored)  - 1-2 x daily - 7 x weekly - 2 sets - 5 reps - 5 sec hold - Isometric Shoulder Extension at Wall (Mirrored)  - 1-2 x daily - 7 x weekly - 2 sets - 5 reps - 5 sec hold - Gentle Vertical Shoulder Pendulum  - 2-3 x daily - 7 x weekly - 2 sets - 10 reps - Gentle Horizontal Shoulder Pendulum  - 2-3 x daily - 7 x weekly - 2 sets - 10 reps - Circular Shoulder Pendulum with Table Support  - 2-3 x daily - 7 x weekly - 2 sets - 10 reps   ASSESSMENT:  CLINICAL IMPRESSION: Continued with manual therapy to reduce muscle tension in the neck and therex to improve shoulder ROM. Still very tense in the neck and shoulders. Good tolerance for exercises. Alfonzia is progressing well per his rehab protocol and goals and will benefit  from continued skilled PT to address ongoing deficits to improve mobility and activity tolerance with decreased pain interference.   OBJECTIVE IMPAIRMENTS: decreased activity tolerance, decreased coordination, decreased knowledge of condition, decreased ROM, decreased strength, increased fascial restrictions, impaired perceived functional ability, increased muscle spasms, impaired flexibility, impaired sensation, impaired UE functional use, postural dysfunction, and pain.   ACTIVITY LIMITATIONS: carrying, lifting, sleeping, bathing, dressing, reach over head, hygiene/grooming, and caring for others  PARTICIPATION LIMITATIONS: meal prep, cleaning, laundry, driving, shopping, community activity, and yard work  PERSONAL FACTORS: Past/current experiences, Time since onset of injury/illness/exacerbation, and 3+ comorbidities: R TKA 07/23/22, L TSA 11/01/20, anxiety, arthritis, back pain, osteoporosis, prediabetes, ventral hernia repair  are also affecting patient's functional outcome.   REHAB POTENTIAL: Good  CLINICAL DECISION MAKING: Stable/uncomplicated  EVALUATION  COMPLEXITY: Low   GOALS: Goals reviewed with patient? Yes  SHORT TERM GOALS: Target date: 04/27/2023  Patient will be independent with initial HEP to improve outcomes and carryover.  Baseline:  Goal status: MET - 04/10/23  2.  Patient will report 25% improvement in R shoulder pain to improve tolerance for exercise and activity.  Baseline: 6/10 Goal status: MET - 04/10/23 - Pain 3-4/10  LONG TERM GOALS: Target date: 05/25/2023  Patient will be independent with ongoing/advanced HEP for self-management at home.  Baseline:  Goal status: IN PROGRESS  2.  Patient will report 75% improvement in R shoulder pain to improve QOL.  Baseline: 6/10 Goal status: IN PROGRESS  3.  Patient to demonstrate improved upright posture with posterior shoulder girdle engaged to promote improved glenohumeral joint mobility. Baseline: Forward head and rounded shoulder posture Goal status: IN PROGRESS  4.  Patient to improve R shoulder AROM to [facility] without pain provocation to allow for increased ease of ADLs.  Baseline: Refer to above UE ROM table Goal status: IN PROGRESS  5.  Patient will demonstrate improved R shoulder strength to >/= 4 to 4+/5 for functional UE use. Baseline: Refer to above UE MMT table Goal status: IN PROGRESS  6  Patient will report </= 60% on QuickDASH to demonstrate improved functional ability (MDIC = 10).  Baseline: 70.5 / 100 = 70.5 % Goal status: IN PROGRESS  7.  Patient to report ability to perform ADLs, household, and leisure-related tasks without limitation due to R shoulder pain, LOM or weakness.   Baseline: Limited with all activities requiring use of R arm Goal status: IN PROGRESS    PLAN:  PT FREQUENCY: 2x/week  PT DURATION: 8 weeks  PLANNED INTERVENTIONS: Therapeutic exercises, Therapeutic activity, Neuromuscular re-education, Patient/Family education, Self Care, Joint mobilization, Dry Needling, Electrical stimulation, Cryotherapy, Moist heat, Taping,  Vasopneumatic device, Ultrasound, Ionotophoresis 4mg /ml Dexamethasone, Manual therapy, and Re-evaluation  PLAN FOR NEXT SESSION: Per reverse TSA protocol (Sx 03/12/23) - post-op week #5 as of 04/16/23 - gentle PROM - gradually increasing forward flexion and elevation in the scapular plane to 120; begin periscapular deltoid submaximal pain-free isometrics and scapular plane; gentle resisted exercises of elbow, wrist and hand   Carnetta Losada L Garek Schuneman, PTA 04/13/2023, 11:11 AM

## 2023-04-16 ENCOUNTER — Encounter: Payer: Self-pay | Admitting: Physical Therapy

## 2023-04-16 ENCOUNTER — Ambulatory Visit: Payer: Medicare Other | Admitting: Physical Therapy

## 2023-04-16 DIAGNOSIS — R293 Abnormal posture: Secondary | ICD-10-CM

## 2023-04-16 DIAGNOSIS — M25611 Stiffness of right shoulder, not elsewhere classified: Secondary | ICD-10-CM

## 2023-04-16 DIAGNOSIS — M6281 Muscle weakness (generalized): Secondary | ICD-10-CM | POA: Diagnosis not present

## 2023-04-16 DIAGNOSIS — M25511 Pain in right shoulder: Secondary | ICD-10-CM | POA: Diagnosis not present

## 2023-04-16 NOTE — Therapy (Signed)
OUTPATIENT PHYSICAL THERAPY TREATMENT   Patient Name: [name] MRN: [id number] DOB:[birth year], 77 y.o., male Today's Date: 04/16/2023   END OF SESSION:  PT End of Session - 04/16/23 0932     Visit Number 6    Date for PT Re-Evaluation 05/25/23    Authorization Type Medicare & Tricare    PT Start Time 0932    PT Stop Time 1014    PT Time Calculation (min) 42 min    Activity Tolerance Patient tolerated treatment well    Behavior During Therapy [facility] for tasks assessed/performed                 Past Medical History:  Diagnosis Date   Abnormal CT scan, gastrointestinal tract suspect angioedema    Anemia 03/16/2016   IRON   Anxiety    Arthritis    Back pain 10/10/2012   Fatty liver 2005   seen on 2005 ultrasound,05/2015 CT.    Hemorrhoids 08/22/2016   Hemorrhoids, internal, with bleeding 2005   internal and external. 2005 band ligation (dr Noe Gens in [facility])   Hyperglycemia 08/21/2013   Hyperlipidemia    Hypertension    Nocturia 08/21/2013   Obesity (BMI 30.0-34.9)    Osteoporosis    Pneumonia    as a child   Pre-diabetes    Rectal bleeding 07/30/2014   Renal cyst    Left Kidney   SBO (small bowel obstruction) (HCC)    Tubular adenoma of colon before 2005, 2012, 08/2014   Unspecified constipation 02/06/2014   Vitamin D deficiency    Past Surgical History:  Procedure Laterality Date   BICEPT TENODESIS Right 03/12/2023   Procedure: BICEPS TENODESIS;  Surgeon: Cammy Copa, MD;  Location: [facility] OR;  Service: Orthopedics;  Laterality: Right;   CATARACT EXTRACTION W/ INTRAOCULAR LENS IMPLANT Bilateral    COLONOSCOPY  before 2005, 2012, 2013, 08/2014.    ENTEROSCOPY N/A 10/05/2017   Procedure: ENTEROSCOPY;  Surgeon: Iva Boop, MD;  Location: [facility] ENDOSCOPY;  Service: Endoscopy;  Laterality: N/A;   FRACTURE SURGERY Left 1989   leg   HEMORRHOID BANDING  2005   IR RADIOLOGIST EVAL & MGMT  11/11/2021   KNEE SURGERY Left 2010   arthroscopy, torn  carilage   KNEE SURGERY Right 2012   arthroscopy   REVERSE SHOULDER ARTHROPLASTY Right 03/12/2023   Procedure: REVERSE SHOULDER ARTHROPLASTY;  Surgeon: Cammy Copa, MD;  Location: [facility] OR;  Service: Orthopedics;  Laterality: Right;   TONSILLECTOMY  1957   TOTAL KNEE ARTHROPLASTY Right 07/24/2022   Procedure: RIGHT TOTAL KNEE ARTHROPLASTY;  Surgeon: Cammy Copa, MD;  Location: [facility] OR;  Service: Orthopedics;  Laterality: Right;   TOTAL SHOULDER ARTHROPLASTY Left 11/01/2020   Procedure: LEFT ANATOMIC SHOULDER REPLACEMENT;  Surgeon: Cammy Copa, MD;  Location: [facility] OR;  Service: Orthopedics;  Laterality: Left;   VENTRAL HERNIA REPAIR N/A 06/03/2018   Procedure: LAPAROSCOPIC VENTRAL WALL  HERNIA  REPAIR WITH MESH  ERAS PATHWAY;  Surgeon: Karie Soda, MD;  Location: [facility] ORS;  Service: General;  Laterality: N/A;   Patient Active Problem List   Diagnosis Date Noted   Primary osteoarthritis of right shoulder 03/14/2023   Biceps tendonitis on right 03/14/2023   Shoulder fracture 03/12/2023   S/P reverse total shoulder arthroplasty, right 03/12/2023   Arthritis of right knee 07/27/2022   S/P total knee arthroplasty, right 07/24/2022   Shoulder arthritis    S/P shoulder replacement, 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   Skin tags, multiple acquired 07/07/2019  LUQ pain 07/07/2019   OA (osteoarthritis) of knee 03/16/2019   Ventral hernia s/p lap repair with mesh 06/03/2018 06/03/2018   Hemorrhoids 08/22/2016   Anemia 03/16/2016   Right shoulder pain 02/26/2016   Abnormal CT scan, gastrointestinal tract suspect angioedema    Abdominal pain    Hyperglycemia 08/21/2013   Low back pain 10/10/2012   Hyperlipidemia    Hypertension, essential     Obesity (BMI 30.0-34.9)     PCP: Bradd Canary, MD   REFERRING PROVIDER: Cammy Copa, MD   REFERRING DIAG:  [phone] (ICD-10-CM) - S/P reverse total shoulder arthroplasty, right  M19.011 (ICD-10-CM) - Primary osteoarthritis of right shoulder   M75.21 (ICD-10-CM) - Biceps tendonitis on right    THERAPY DIAG:  Stiffness of right shoulder, not elsewhere classified  Acute pain of right shoulder  Abnormal posture  Muscle weakness (generalized)  RATIONALE FOR EVALUATION AND TREATMENT: Rehabilitation  ONSET DATE: 03/12/23 - R reverse TSA + biceps tenodesis  NEXT MD VISIT: 04/24/23   SUBJECTIVE:                                                                                                                                                                                                         SUBJECTIVE STATEMENT: Pt reports his shoulder ached all day yesterday (had stopped taking the prescription pain meds with no OTC pain meds), but the shoulder feels better today after starting back on the meds.  EVAL: Pt reports the PA released him from the sling as of his postop follow-up on Friday 03/27/23. He states he had the CPM at home 3x/day x 1.5 hrs for the first 2 weeks post, but now discontinued. Biggest concerns currently with opening jars and feeding himself with R hand. Still sleeping in the recliner as the bed is too uncomfortable. He has been completing distal UE HEP exercises 3x/day.  PAIN: Are you having pain? Yes: NPRS scale: 2/10 Pain location: R anterior shoulder  Pain description: ache, tightness Aggravating factors: raising his arm too high, working with the mouse on the computer Relieving factors: oxycodone, gabapentin and muscle relaxant, ice  PERTINENT HISTORY:  R TKA 07/23/22, L TSA 11/01/20, anxiety, arthritis, back pain, osteoporosis, prediabetes, ventral hernia repair  PRECAUTIONS: Shoulder  RED FLAGS: None  HAND DOMINANCE: Right  WEIGHT BEARING RESTRICTIONS: Yes NWB R UE  FALLS:  Has patient fallen in last 6 months? No  LIVING ENVIRONMENT: Lives with: lives with their spouse Lives in: House/apartment Stairs: Yes: Internal: 12 steps; on right going up and External: 2 steps; none Has  following  equipment at home: Single point cane and Walker - 2 wheeled  OCCUPATION: Retired  PLOF: Independent and Leisure: Golf, yard work, gym - bike    PATIENT GOALS: "Better ROM and less pain."   OBJECTIVE: (objective measures completed at initial evaluation unless otherwise dated)  DIAGNOSTIC FINDINGS:  03/12/23 - DG Right shoulder - IMPRESSION: Interval reverse right shoulder arthroplasty without evidence ofhardware failure or loosening.  PATIENT SURVEYS:  Quick Dash 70.5 / 100 = 70.5 %  COGNITION: Overall cognitive status: Within functional limits for tasks assessed     SENSATION: WFL Pt reports intermittent gnawing "toothache" feeling in his L thumb, tingling has subsided  POSTURE: rounded shoulders and forward head  UPPER EXTREMITY ROM:   Passive ROM Right eval R 04/10/23  Shoulder flexion 100 120  Shoulder extension    Shoulder abduction 88 108  Shoulder adduction    Shoulder internal rotation    Shoulder external rotation 15 23  (Blank rows = not tested)  AROM assessment deferred on eval due to patient only 2 weeks postop Active ROM Right eval Left eval  Shoulder flexion    Shoulder extension    Shoulder abduction    Shoulder adduction    Shoulder internal rotation    Shoulder external rotation    Elbow flexion    Elbow extension    Wrist flexion    Wrist extension    Wrist ulnar deviation    Wrist radial deviation    Wrist pronation    Wrist supination    (Blank rows = not tested)  UPPER EXTREMITY MMT: (Deferred on eval due to only 2 weeks postop)  MMT Right eval Left eval  Shoulder flexion    Shoulder extension    Shoulder abduction    Shoulder adduction    Shoulder internal rotation    Shoulder external rotation    Middle trapezius    Lower trapezius    Elbow flexion    Elbow extension    Wrist flexion    Wrist extension    Wrist ulnar deviation    Wrist radial deviation    Wrist pronation    Wrist supination    Grip strength (lbs)     (Blank rows = not tested)  PALPATION:  Mild TTP R pecs, biceps, teres group    TODAY'S TREATMENT:   04/16/23 MANUAL THERAPY: To promote normalized muscle tension, improved flexibility, improved joint mobility, increased ROM, and reduced pain. R shoulder flexion, scaption and ER PROM within limits of protocol and pain tolerance  STM/DTM and manual TPR to R pecs and multiple areas in anterior and lateral deltoid Incisional scar massage to visible area of distal incision below steri-strips   04/13/23 THERAPEUTIC EXERCISE: to improve flexibility, strength and mobility.  Demonstration, verbal and tactile cues throughout for technique.  Seated table slide with green x 10 flexion Seated table slide with green x 10 abduction  MANUAL THERAPY: To promote improved flexibility, improved joint mobility, increased ROM, and reduced pain. R shoulder flexion, scaption and ER PROM within limits of protocol and pain tolerance  STM to R teres group, UT/LS   04/10/23 THERAPEUTIC EXERCISE: to improve flexibility, strength and mobility.  Demonstration, verbal and tactile cues throughout for technique.  Seated B scapular retraction and depression 10 x 5" Seated B shoulder rolls 10x5" Seated R shoulder flexion PROM leaning forward with hand on swiffer post supporting arm 2 x 10 Seated R shoulder scaption PROM leaning diagonal forward with hand on swiffer post supporting  arm 2 x 10  MANUAL THERAPY: To promote improved flexibility, improved joint mobility, increased ROM, and reduced pain. R shoulder flexion, scaption and ER PROM within limits of protocol and pain tolerance  STM to R teres group, subscapularis, lats, lateral & posterior deltoids  MODALITIES: Game Ready vasopneumatic compression post session to R shoulder x 10 min, low compression, 34 to reduce post-exercise pain and swelling/edema   PATIENT EDUCATION:  Education details: progress with PT and ongoing PT POC  Person educated:  Patient Education method: Explanation Education comprehension: verbalized understanding  HOME EXERCISE PROGRAM: Access Code: [email or web address] URL: [email or web address] Date: 04/03/2023 Prepared by: Glenetta Hew  Exercises - Seated Shoulder Flexion Towel Slide at Table Top  - 2-3 x daily - 7 x weekly - 2 sets - 10 reps - 3 sec  hold - Seated Shoulder Scaption Slide at Table Top with Forearm in Neutral  - 2-3 x daily - 7 x weekly - 2 sets - 10 reps - 3 sec hold - Seated Shoulder Setting  - 2-3 x daily - 7 x weekly - 2 sets - 10 reps - 3 sec hold - Isometric Shoulder Flexion at Wall (Mirrored)  - 1-2 x daily - 7 x weekly - 2 sets - 5 reps - 5 sec hold - Isometric Shoulder Abduction at Wall (Mirrored)  - 1-2 x daily - 7 x weekly - 2 sets - 5 reps - 5 sec hold - Isometric Shoulder Extension at Wall (Mirrored)  - 1-2 x daily - 7 x weekly - 2 sets - 5 reps - 5 sec hold - Gentle Vertical Shoulder Pendulum  - 2-3 x daily - 7 x weekly - 2 sets - 10 reps - Gentle Horizontal Shoulder Pendulum  - 2-3 x daily - 7 x weekly - 2 sets - 10 reps - Circular Shoulder Pendulum with Table Support  - 2-3 x daily - 7 x weekly - 2 sets - 10 reps   ASSESSMENT:  CLINICAL IMPRESSION: Roscoe reports increased increased pain (ache and tightness) yesterday after stopping his prescription pain meds and muscle relaxants and not taking any OTC meds, but better today.  Significant increased muscle tension and taut bands/TPs present and R pecs as well as anterior and lateral deltoid muscles today which were addressed with STM/DTM and manual TPR with good relief noted resulting in improved tolerance for R shoulder PROM with both flexion and scaption PROM grossly at current limits of protocol.  Yoandri is progressing well per his rehab protocol and goals and will benefit from continued skilled PT to address ongoing deficits to improve mobility and activity tolerance with decreased pain interference.   OBJECTIVE  IMPAIRMENTS: decreased activity tolerance, decreased coordination, decreased knowledge of condition, decreased ROM, decreased strength, increased fascial restrictions, impaired perceived functional ability, increased muscle spasms, impaired flexibility, impaired sensation, impaired UE functional use, postural dysfunction, and pain.   ACTIVITY LIMITATIONS: carrying, lifting, sleeping, bathing, dressing, reach over head, hygiene/grooming, and caring for others  PARTICIPATION LIMITATIONS: meal prep, cleaning, laundry, driving, shopping, community activity, and yard work  PERSONAL FACTORS: Past/current experiences, Time since onset of injury/illness/exacerbation, and 3+ comorbidities: R TKA 07/23/22, L TSA 11/01/20, anxiety, arthritis, back pain, osteoporosis, prediabetes, ventral hernia repair  are also affecting patient's functional outcome.   REHAB POTENTIAL: Good  CLINICAL DECISION MAKING: Stable/uncomplicated  EVALUATION COMPLEXITY: Low   GOALS: Goals reviewed with patient? Yes  SHORT TERM GOALS: Target date: 04/27/2023  Patient will be independent with initial HEP to improve outcomes and carryover.  Baseline:  Goal status: MET - 04/10/23  2.  Patient will report 25% improvement in R shoulder pain to improve tolerance for exercise and activity.  Baseline: 6/10 Goal status: MET - 04/10/23 - Pain 3-4/10  LONG TERM GOALS: Target date: 05/25/2023  Patient will be independent with ongoing/advanced HEP for self-management at home.  Baseline:  Goal status: IN PROGRESS  2.  Patient will report 75% improvement in R shoulder pain to improve QOL.  Baseline: 6/10 Goal status: IN PROGRESS  3.  Patient to demonstrate improved upright posture with posterior shoulder girdle engaged to promote improved glenohumeral joint mobility. Baseline: Forward head and rounded shoulder posture Goal status: IN PROGRESS  4.  Patient to improve R shoulder AROM to [facility] without pain provocation to allow for  increased ease of ADLs.  Baseline: Refer to above UE ROM table Goal status: IN PROGRESS  5.  Patient will demonstrate improved R shoulder strength to >/= 4 to 4+/5 for functional UE use. Baseline: Refer to above UE MMT table Goal status: IN PROGRESS  6  Patient will report </= 60% on QuickDASH to demonstrate improved functional ability (MDIC = 10).  Baseline: 70.5 / 100 = 70.5 % Goal status: IN PROGRESS  7.  Patient to report ability to perform ADLs, household, and leisure-related tasks without limitation due to R shoulder pain, LOM or weakness.   Baseline: Limited with all activities requiring use of R arm Goal status: IN PROGRESS    PLAN:  PT FREQUENCY: 2x/week  PT DURATION: 8 weeks  PLANNED INTERVENTIONS: Therapeutic exercises, Therapeutic activity, Neuromuscular re-education, Patient/Family education, Self Care, Joint mobilization, Dry Needling, Electrical stimulation, Cryotherapy, Moist heat, Taping, Vasopneumatic device, Ultrasound, Ionotophoresis 4mg /ml Dexamethasone, Manual therapy, and Re-evaluation  PLAN FOR NEXT SESSION: Per reverse TSA protocol (Sx 03/12/23) - post-op week #5 as of 04/16/23 - gentle PROM - gradually increasing forward flexion and elevation in the scapular plane to 120; begin periscapular deltoid submaximal pain-free isometrics and scapular plane; gentle resisted exercises of elbow, wrist and hand   Marry Guan, PT 04/16/2023, 10:44 AM

## 2023-04-17 ENCOUNTER — Telehealth: Payer: Self-pay | Admitting: Orthopedic Surgery

## 2023-04-17 ENCOUNTER — Other Ambulatory Visit: Payer: Self-pay | Admitting: Surgical

## 2023-04-17 MED ORDER — OXYCODONE HCL 5 MG PO TABS: 5.0000 mg | ORAL_TABLET | Freq: Two times a day (BID) | ORAL | 0 refills | Status: DC | PRN

## 2023-04-17 NOTE — Telephone Encounter (Signed)
Pt called requesting refill of oxycodone. Please send to pharmacy on file. Pt phone number is [phone].

## 2023-04-17 NOTE — Telephone Encounter (Signed)
Sent in

## 2023-04-17 NOTE — Telephone Encounter (Signed)
Called and advised pt.

## 2023-04-20 ENCOUNTER — Encounter: Payer: Self-pay | Admitting: Physical Therapy

## 2023-04-20 ENCOUNTER — Ambulatory Visit: Payer: Medicare Other | Admitting: Physical Therapy

## 2023-04-20 DIAGNOSIS — M6281 Muscle weakness (generalized): Secondary | ICD-10-CM

## 2023-04-20 DIAGNOSIS — M25611 Stiffness of right shoulder, not elsewhere classified: Secondary | ICD-10-CM

## 2023-04-20 DIAGNOSIS — M25511 Pain in right shoulder: Secondary | ICD-10-CM

## 2023-04-20 DIAGNOSIS — R293 Abnormal posture: Secondary | ICD-10-CM

## 2023-04-20 NOTE — Therapy (Signed)
OUTPATIENT PHYSICAL THERAPY TREATMENT   Patient Name: [name] MRN: [id number] DOB:[birth year], 77 y.o., male Today's Date: 04/20/2023   END OF SESSION:  PT End of Session - 04/20/23 0931     Visit Number 7    Date for PT Re-Evaluation 05/25/23    Authorization Type Medicare & Tricare    PT Start Time [phone]    PT Stop Time 1011    PT Time Calculation (min) 40 min    Activity Tolerance Patient tolerated treatment well    Behavior During Therapy [facility] for tasks assessed/performed                 Past Medical History:  Diagnosis Date   Abnormal CT scan, gastrointestinal tract suspect angioedema    Anemia 03/16/2016   IRON   Anxiety    Arthritis    Back pain 10/10/2012   Fatty liver 2005   seen on 2005 ultrasound,05/2015 CT.    Hemorrhoids 08/22/2016   Hemorrhoids, internal, with bleeding 2005   internal and external. 2005 band ligation (dr Noe Gens in [facility])   Hyperglycemia 08/21/2013   Hyperlipidemia    Hypertension    Nocturia 08/21/2013   Obesity (BMI 30.0-34.9)    Osteoporosis    Pneumonia    as a child   Pre-diabetes    Rectal bleeding 07/30/2014   Renal cyst    Left Kidney   SBO (small bowel obstruction) (HCC)    Tubular adenoma of colon before 2005, 2012, 08/2014   Unspecified constipation 02/06/2014   Vitamin D deficiency    Past Surgical History:  Procedure Laterality Date   BICEPT TENODESIS Right 03/12/2023   Procedure: BICEPS TENODESIS;  Surgeon: Cammy Copa, MD;  Location: [facility] OR;  Service: Orthopedics;  Laterality: Right;   CATARACT EXTRACTION W/ INTRAOCULAR LENS IMPLANT Bilateral    COLONOSCOPY  before 2005, 2012, 2013, 08/2014.    ENTEROSCOPY N/A 10/05/2017   Procedure: ENTEROSCOPY;  Surgeon: Iva Boop, MD;  Location: [facility] ENDOSCOPY;  Service: Endoscopy;  Laterality: N/A;   FRACTURE SURGERY Left 1989   leg   HEMORRHOID BANDING  2005   IR RADIOLOGIST EVAL & MGMT  11/11/2021   KNEE SURGERY Left 2010   arthroscopy, torn  carilage   KNEE SURGERY Right 2012   arthroscopy   REVERSE SHOULDER ARTHROPLASTY Right 03/12/2023   Procedure: REVERSE SHOULDER ARTHROPLASTY;  Surgeon: Cammy Copa, MD;  Location: [facility] OR;  Service: Orthopedics;  Laterality: Right;   TONSILLECTOMY  1957   TOTAL KNEE ARTHROPLASTY Right 07/24/2022   Procedure: RIGHT TOTAL KNEE ARTHROPLASTY;  Surgeon: Cammy Copa, MD;  Location: [facility] OR;  Service: Orthopedics;  Laterality: Right;   TOTAL SHOULDER ARTHROPLASTY Left 11/01/2020   Procedure: LEFT ANATOMIC SHOULDER REPLACEMENT;  Surgeon: Cammy Copa, MD;  Location: [facility] OR;  Service: Orthopedics;  Laterality: Left;   VENTRAL HERNIA REPAIR N/A 06/03/2018   Procedure: LAPAROSCOPIC VENTRAL WALL  HERNIA  REPAIR WITH MESH  ERAS PATHWAY;  Surgeon: Karie Soda, MD;  Location: [facility] ORS;  Service: General;  Laterality: N/A;   Patient Active Problem List   Diagnosis Date Noted   Primary osteoarthritis of right shoulder 03/14/2023   Biceps tendonitis on right 03/14/2023   Shoulder fracture 03/12/2023   S/P reverse total shoulder arthroplasty, right 03/12/2023   Arthritis of right knee 07/27/2022   S/P total knee arthroplasty, right 07/24/2022   Shoulder arthritis    S/P shoulder replacement, 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   Skin tags, multiple acquired 07/07/2019  LUQ pain 07/07/2019   OA (osteoarthritis) of knee 03/16/2019   Ventral hernia s/p lap repair with mesh 06/03/2018 06/03/2018   Hemorrhoids 08/22/2016   Anemia 03/16/2016   Right shoulder pain 02/26/2016   Abnormal CT scan, gastrointestinal tract suspect angioedema    Abdominal pain    Hyperglycemia 08/21/2013   Low back pain 10/10/2012   Hyperlipidemia    Hypertension, essential     Obesity (BMI 30.0-34.9)     PCP: Bradd Canary, MD   REFERRING PROVIDER: Cammy Copa, MD   REFERRING DIAG:  [phone] (ICD-10-CM) - S/P reverse total shoulder arthroplasty, right  M19.011 (ICD-10-CM) - Primary osteoarthritis of right shoulder   M75.21 (ICD-10-CM) - Biceps tendonitis on right    THERAPY DIAG:  Stiffness of right shoulder, not elsewhere classified  Acute pain of right shoulder  Abnormal posture  Muscle weakness (generalized)  RATIONALE FOR EVALUATION AND TREATMENT: Rehabilitation  ONSET DATE: 03/12/23 - R reverse TSA + biceps tenodesis  NEXT MD VISIT: 04/24/23   SUBJECTIVE:                                                                                                                                                                                                         SUBJECTIVE STATEMENT: Pt reports more tightness than pain this morning. States he feels like the shoulder is getting stronger but he has still be careful to follow the restrictions of th protocol.Marland Kitchen  EVAL: Pt reports the PA released him from the sling as of his postop follow-up on Friday 03/27/23. He states he had the CPM at home 3x/day x 1.5 hrs for the first 2 weeks post, but now discontinued. Biggest concerns currently with opening jars and feeding himself with R hand. Still sleeping in the recliner as the bed is too uncomfortable. He has been completing distal UE HEP exercises 3x/day.  PAIN: Are you having pain? Yes: NPRS scale:  1/10 Pain location: R anterior shoulder  Pain description: ache, tightness Aggravating factors: raising his arm too high, working with the mouse on the computer Relieving factors: oxycodone, gabapentin and muscle relaxant, ice  PERTINENT HISTORY:  R TKA 07/23/22, L TSA 11/01/20, anxiety, arthritis, back pain, osteoporosis, prediabetes, ventral hernia repair  PRECAUTIONS: Shoulder  RED FLAGS: None  HAND DOMINANCE: Right  WEIGHT BEARING RESTRICTIONS: Yes NWB R UE  FALLS:  Has patient fallen in last 6 months? No  LIVING ENVIRONMENT: Lives with: lives with their spouse Lives in: House/apartment Stairs: Yes: Internal: 12 steps; on right going up and External: 2 steps; none Has following  equipment at  home: Single point cane and Walker - 2 wheeled  OCCUPATION: Retired  PLOF: Independent and Leisure: Golf, yard work, gym - bike    PATIENT GOALS: "Better ROM and less pain."   OBJECTIVE: (objective measures completed at initial evaluation unless otherwise dated)  DIAGNOSTIC FINDINGS:  03/12/23 - DG Right shoulder - IMPRESSION: Interval reverse right shoulder arthroplasty without evidence ofhardware failure or loosening.  PATIENT SURVEYS:  Quick Dash 70.5 / 100 = 70.5 %  COGNITION: Overall cognitive status: Within functional limits for tasks assessed     SENSATION: WFL Pt reports intermittent gnawing "toothache" feeling in his L thumb, tingling has subsided  POSTURE: rounded shoulders and forward head  UPPER EXTREMITY ROM:   Passive ROM Right eval R 04/10/23  Shoulder flexion 100 120  Shoulder extension    Shoulder abduction 88 108  Shoulder adduction    Shoulder internal rotation    Shoulder external rotation 15 23  (Blank rows = not tested)  AROM assessment deferred on eval due to patient only 2 weeks postop Active ROM Right eval Left eval  Shoulder flexion    Shoulder extension    Shoulder abduction    Shoulder adduction    Shoulder internal rotation    Shoulder external rotation    Elbow flexion    Elbow extension    Wrist flexion    Wrist extension    Wrist ulnar deviation    Wrist radial deviation    Wrist pronation    Wrist supination    (Blank rows = not tested)  UPPER EXTREMITY MMT: (Deferred on eval due to only 2 weeks postop)  MMT Right eval Left eval  Shoulder flexion    Shoulder extension    Shoulder abduction    Shoulder adduction    Shoulder internal rotation    Shoulder external rotation    Middle trapezius    Lower trapezius    Elbow flexion    Elbow extension    Wrist flexion    Wrist extension    Wrist ulnar deviation    Wrist radial deviation    Wrist pronation    Wrist supination    Grip strength (lbs)    (Blank rows = not  tested)  PALPATION:  Mild TTP R pecs, biceps, teres group    TODAY'S TREATMENT:   04/20/23 MANUAL THERAPY: To promote normalized muscle tension, improved flexibility, improved joint mobility, increased ROM, and reduced pain. R shoulder flexion, scaption and ER PROM within limits of protocol and pain tolerance  STM/DTM and manual TPR to R pecs, teres group and multiple areas in anterior and lateral deltoid Incisional scar massage to visible area of distal incision below steri-strips  THERAPEUTIC EXERCISE: to improve flexibility, strength and mobility.  Demonstration, verbal and tactile cues throughout for technique.  Seated B scapular retraction and depression 10 x 5" Seated B shoulder rolls 10x5" Seated R shoulder flexion PROM leaning forward with hand on swiffer post supporting arm 2 x 10 Seated R shoulder scaption PROM leaning diagonal forward with hand on swiffer post supporting arm 2 x 10 Seated R 3-way elbow curls with elbow at side 2# x 10 each Seated R pronation/supination with arm supported on pillow in lap2# 2 x 10 Seated R wrist flexion & extension with arm supported on pillow in lap 2# x 10 each   04/16/23 MANUAL THERAPY: To promote normalized muscle tension, improved flexibility, improved joint mobility, increased ROM, and reduced pain. R shoulder flexion, scaption and ER PROM  within limits of protocol and pain tolerance  STM/DTM and manual TPR to R pecs and multiple areas in anterior and lateral deltoid Incisional scar massage to visible area of distal incision below steri-strips   04/13/23 THERAPEUTIC EXERCISE: to improve flexibility, strength and mobility.  Demonstration, verbal and tactile cues throughout for technique.  Seated table slide with green x 10 flexion Seated table slide with green x 10 abduction  MANUAL THERAPY: To promote improved flexibility, improved joint mobility, increased ROM, and reduced pain. R shoulder flexion, scaption and ER PROM within  limits of protocol and pain tolerance  STM to R teres group, UT/LS   PATIENT EDUCATION:  Education details: progress with PT, ongoing PT POC, and continue HEP   Person educated: Patient Education method: Explanation Education comprehension: verbalized understanding  HOME EXERCISE PROGRAM: Access Code: [email or web address] URL: [email or web address] Date: 04/03/2023 Prepared by: Glenetta Hew  Exercises - Seated Shoulder Flexion Towel Slide at Table Top  - 2-3 x daily - 7 x weekly - 2 sets - 10 reps - 3 sec  hold - Seated Shoulder Scaption Slide at Table Top with Forearm in Neutral  - 2-3 x daily - 7 x weekly - 2 sets - 10 reps - 3 sec hold - Seated Shoulder Setting  - 2-3 x daily - 7 x weekly - 2 sets - 10 reps - 3 sec hold - Isometric Shoulder Flexion at Wall (Mirrored)  - 1-2 x daily - 7 x weekly - 2 sets - 5 reps - 5 sec hold - Isometric Shoulder Abduction at Wall (Mirrored)  - 1-2 x daily - 7 x weekly - 2 sets - 5 reps - 5 sec hold - Isometric Shoulder Extension at Wall (Mirrored)  - 1-2 x daily - 7 x weekly - 2 sets - 5 reps - 5 sec hold - Gentle Vertical Shoulder Pendulum  - 2-3 x daily - 7 x weekly - 2 sets - 10 reps - Gentle Horizontal Shoulder Pendulum  - 2-3 x daily - 7 x weekly - 2 sets - 10 reps - Circular Shoulder Pendulum with Table Support  - 2-3 x daily - 7 x weekly - 2 sets - 10 reps   ASSESSMENT:  CLINICAL IMPRESSION: Random reports minimal pain today and feels like his shoulder is getting stronger. Cautioned him to continue to adhere to protocol precautions avoiding AROM at shoulder until able to proceed into next rehab phase later this week.  Abnormal muscle tension less pronounced today but still noting some taut bands/TPs in R pecs and teres group as well as anterior and lateral deltoid muscles today which were addressed with STM/DTM and manual TPR with good relief noted resulting in improved tolerance for R shoulder PROM with both flexion and scaption PROM  remaining grossly at current limits of protocol and ER gradually improving. He will be 6 weeks post-op as of 04/23/23 and appears ready to transition into next phase of rehab protocol. He will f/u with the MD on 04/24/23.  [name] is progressing well per his rehab protocol and goals and will benefit from continued skilled PT to address ongoing deficits to improve mobility and activity tolerance with decreased pain interference.   OBJECTIVE IMPAIRMENTS: decreased activity tolerance, decreased coordination, decreased knowledge of condition, decreased ROM, decreased strength, increased fascial restrictions, impaired perceived functional ability, increased muscle spasms, impaired flexibility, impaired sensation, impaired UE functional use, postural dysfunction, and pain.   ACTIVITY LIMITATIONS: carrying, lifting, sleeping, bathing, dressing, reach over head, hygiene/grooming, and caring for  others  PARTICIPATION LIMITATIONS: meal prep, cleaning, laundry, driving, shopping, community activity, and yard work  PERSONAL FACTORS: Past/current experiences, Time since onset of injury/illness/exacerbation, and 3+ comorbidities: R TKA 07/23/22, L TSA 11/01/20, anxiety, arthritis, back pain, osteoporosis, prediabetes, ventral hernia repair  are also affecting patient's functional outcome.   REHAB POTENTIAL: Good  CLINICAL DECISION MAKING: Stable/uncomplicated  EVALUATION COMPLEXITY: Low   GOALS: Goals reviewed with patient? Yes  SHORT TERM GOALS: Target date: 04/27/2023  Patient will be independent with initial HEP to improve outcomes and carryover.  Baseline:  Goal status: MET - 04/10/23  2.  Patient will report 25% improvement in R shoulder pain to improve tolerance for exercise and activity.  Baseline: 6/10 Goal status: MET - 04/10/23 - Pain 3-4/10  LONG TERM GOALS: Target date: 05/25/2023  Patient will be independent with ongoing/advanced HEP for self-management at home.  Baseline:  Goal status:  IN PROGRESS  2.  Patient will report 75% improvement in R shoulder pain to improve QOL.  Baseline: 6/10 Goal status: IN PROGRESS  3.  Patient to demonstrate improved upright posture with posterior shoulder girdle engaged to promote improved glenohumeral joint mobility. Baseline: Forward head and rounded shoulder posture Goal status: IN PROGRESS  4.  Patient to improve R shoulder AROM to [facility] without pain provocation to allow for increased ease of ADLs.  Baseline: Refer to above UE ROM table Goal status: IN PROGRESS  5.  Patient will demonstrate improved R shoulder strength to >/= 4 to 4+/5 for functional UE use. Baseline: Refer to above UE MMT table Goal status: IN PROGRESS  6  Patient will report </= 60% on QuickDASH to demonstrate improved functional ability (MDIC = 10).  Baseline: 70.5 / 100 = 70.5 % Goal status: IN PROGRESS  7.  Patient to report ability to perform ADLs, household, and leisure-related tasks without limitation due to R shoulder pain, LOM or weakness.   Baseline: Limited with all activities requiring use of R arm Goal status: IN PROGRESS    PLAN:  PT FREQUENCY: 2x/week  PT DURATION: 8 weeks  PLANNED INTERVENTIONS: Therapeutic exercises, Therapeutic activity, Neuromuscular re-education, Patient/Family education, Self Care, Joint mobilization, Dry Needling, Electrical stimulation, Cryotherapy, Moist heat, Taping, Vasopneumatic device, Ultrasound, Ionotophoresis 4mg /ml Dexamethasone, Manual therapy, and Re-evaluation  PLAN FOR NEXT SESSION: MD PN for 04/24/23 appt;  Per reverse TSA protocol (Sx 03/12/23) - post-op week #6 as of 04/23/23 - continue with PROM & begin AA/AROM - gradually increasing forward flexion and elevation in the scapular plane in supine, progressing to sitting/standing; periscapular deltoid submaximal pain-free isometrics in scapular plane, adding gentle GH IR & ER sub-max pain free isometrics; resisted exercises of elbow, wrist and  hand   Marry Guan, PT 04/20/2023, 10:15 AM

## 2023-04-23 ENCOUNTER — Ambulatory Visit: Payer: Medicare Other

## 2023-04-23 ENCOUNTER — Telehealth: Payer: Self-pay | Admitting: Family Medicine

## 2023-04-23 DIAGNOSIS — M6281 Muscle weakness (generalized): Secondary | ICD-10-CM

## 2023-04-23 DIAGNOSIS — M25611 Stiffness of right shoulder, not elsewhere classified: Secondary | ICD-10-CM

## 2023-04-23 DIAGNOSIS — N401 Enlarged prostate with lower urinary tract symptoms: Secondary | ICD-10-CM | POA: Diagnosis not present

## 2023-04-23 DIAGNOSIS — R293 Abnormal posture: Secondary | ICD-10-CM

## 2023-04-23 DIAGNOSIS — M25511 Pain in right shoulder: Secondary | ICD-10-CM

## 2023-04-23 NOTE — Therapy (Signed)
OUTPATIENT PHYSICAL THERAPY TREATMENT   Patient Name: [name] MRN: [id number] DOB:07/24/1945, 77 y.o., male Today's Date: 04/23/2023   END OF SESSION:  PT End of Session - 04/23/23 1531     Visit Number 8    Date for PT Re-Evaluation 05/25/23    Authorization Type Medicare & Tricare    PT Start Time 1449    PT Stop Time 1530    PT Time Calculation (min) 41 min    Activity Tolerance Patient tolerated treatment well    Behavior During Therapy [facility] for tasks assessed/performed                  Past Medical History:  Diagnosis Date   Abnormal CT scan, gastrointestinal tract suspect angioedema    Anemia 03/16/2016   IRON   Anxiety    Arthritis    Back pain 10/10/2012   Fatty liver 2005   seen on 2005 ultrasound,05/2015 CT.    Hemorrhoids 08/22/2016   Hemorrhoids, internal, with bleeding 2005   internal and external. 2005 band ligation (dr Noe Gens in [facility])   Hyperglycemia 08/21/2013   Hyperlipidemia    Hypertension    Nocturia 08/21/2013   Obesity (BMI 30.0-34.9)    Osteoporosis    Pneumonia    as a child   Pre-diabetes    Rectal bleeding 07/30/2014   Renal cyst    Left Kidney   SBO (small bowel obstruction) (HCC)    Tubular adenoma of colon before 2005, 2012, 08/2014   Unspecified constipation 02/06/2014   Vitamin D deficiency    Past Surgical History:  Procedure Laterality Date   BICEPT TENODESIS Right 03/12/2023   Procedure: BICEPS TENODESIS;  Surgeon: Cammy Copa, MD;  Location: [facility] OR;  Service: Orthopedics;  Laterality: Right;   CATARACT EXTRACTION W/ INTRAOCULAR LENS IMPLANT Bilateral    COLONOSCOPY  before 2005, 2012, 2013, 08/2014.    ENTEROSCOPY N/A 10/05/2017   Procedure: ENTEROSCOPY;  Surgeon: Iva Boop, MD;  Location: [facility];  Service: Endoscopy;  Laterality: N/A;   FRACTURE SURGERY Left 1989   leg   HEMORRHOID BANDING  2005   IR RADIOLOGIST EVAL & MGMT  11/11/2021   KNEE SURGERY Left 2010   arthroscopy, torn  carilage   KNEE SURGERY Right 2012   arthroscopy   REVERSE SHOULDER ARTHROPLASTY Right 03/12/2023   Procedure: REVERSE SHOULDER ARTHROPLASTY;  Surgeon: Cammy Copa, MD;  Location: [facility] OR;  Service: Orthopedics;  Laterality: Right;   TONSILLECTOMY  1957   TOTAL KNEE ARTHROPLASTY Right 07/24/2022   Procedure: RIGHT TOTAL KNEE ARTHROPLASTY;  Surgeon: Cammy Copa, MD;  Location: [facility] OR;  Service: Orthopedics;  Laterality: Right;   TOTAL SHOULDER ARTHROPLASTY Left 11/01/2020   Procedure: LEFT ANATOMIC SHOULDER REPLACEMENT;  Surgeon: Cammy Copa, MD;  Location: [facility] OR;  Service: Orthopedics;  Laterality: Left;   VENTRAL HERNIA REPAIR N/A 06/03/2018   Procedure: LAPAROSCOPIC VENTRAL WALL  HERNIA  REPAIR WITH MESH  ERAS PATHWAY;  Surgeon: Karie Soda, MD;  Location: [facility] ORS;  Service: General;  Laterality: N/A;   Patient Active Problem List   Diagnosis Date Noted   Primary osteoarthritis of right shoulder 03/14/2023   Biceps tendonitis on right 03/14/2023   Shoulder fracture 03/12/2023   S/P reverse total shoulder arthroplasty, right 03/12/2023   Arthritis of right knee 07/27/2022   S/P total knee arthroplasty, right 07/24/2022   Shoulder arthritis    S/P shoulder replacement, 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   Skin tags, multiple acquired 07/07/2019  LUQ pain 07/07/2019   OA (osteoarthritis) of knee 03/16/2019   Ventral hernia s/p lap repair with mesh 06/03/2018 06/03/2018   Hemorrhoids 08/22/2016   Anemia 03/16/2016   Right shoulder pain 02/26/2016   Abnormal CT scan, gastrointestinal tract suspect angioedema    Abdominal pain    Hyperglycemia 08/21/2013   Low back pain 10/10/2012   Hyperlipidemia    Hypertension, essential     Obesity (BMI 30.0-34.9)     PCP: Bradd Canary, MD   REFERRING PROVIDER: Cammy Copa, MD   REFERRING DIAG:  [phone] (ICD-10-CM) - S/P reverse total shoulder arthroplasty, right  M19.011 (ICD-10-CM) - Primary osteoarthritis of right shoulder   M75.21 (ICD-10-CM) - Biceps tendonitis on right    THERAPY DIAG:  Stiffness of right shoulder, not elsewhere classified  Acute pain of right shoulder  Abnormal posture  Muscle weakness (generalized)  RATIONALE FOR EVALUATION AND TREATMENT: Rehabilitation  ONSET DATE: 03/12/23 - R reverse TSA + biceps tenodesis  NEXT MD VISIT: 04/24/23   SUBJECTIVE:                                                                                                                                                                                                         SUBJECTIVE STATEMENT: Shoulder feels tight today. A little soreness.  EVAL: Pt reports the PA released him from the sling as of his postop follow-up on Friday 03/27/23. He states he had the CPM at home 3x/day x 1.5 hrs for the first 2 weeks post, but now discontinued. Biggest concerns currently with opening jars and feeding himself with R hand. Still sleeping in the recliner as the bed is too uncomfortable. He has been completing distal UE HEP exercises 3x/day.  PAIN: Are you having pain? Yes: NPRS scale:  1/10 Pain location: R anterior shoulder  Pain description: ache, tightness Aggravating factors: raising his arm too high, working with the mouse on the computer Relieving factors: oxycodone, gabapentin and muscle relaxant, ice  PERTINENT HISTORY:  R TKA 07/23/22, L TSA 11/01/20, anxiety, arthritis, back pain, osteoporosis, prediabetes, ventral hernia repair  PRECAUTIONS: Shoulder  RED FLAGS: None  HAND DOMINANCE: Right  WEIGHT BEARING RESTRICTIONS: Yes NWB R UE  FALLS:  Has patient fallen in last 6 months? No  LIVING ENVIRONMENT: Lives with: lives with their spouse Lives in: House/apartment Stairs: Yes: Internal: 12 steps; on right going up and External: 2 steps; none Has following equipment at home: Single point cane and Walker - 2 wheeled  OCCUPATION: Retired  PLOF: Independent and Leisure: Golf, yard work, gym -  bike     PATIENT GOALS: "Better ROM and less pain."   OBJECTIVE: (objective measures completed at initial evaluation unless otherwise dated)  DIAGNOSTIC FINDINGS:  03/12/23 - DG Right shoulder - IMPRESSION: Interval reverse right shoulder arthroplasty without evidence ofhardware failure or loosening.  PATIENT SURVEYS:  Quick Dash 70.5 / 100 = 70.5 %  COGNITION: Overall cognitive status: Within functional limits for tasks assessed     SENSATION: WFL Pt reports intermittent gnawing "toothache" feeling in his L thumb, tingling has subsided  POSTURE: rounded shoulders and forward head  UPPER EXTREMITY ROM:   Passive ROM Right eval R 04/10/23 R 04/23/23  Shoulder flexion 100 120 120  Shoulder extension     Shoulder abduction 88 108 110  Shoulder adduction     Shoulder internal rotation     Shoulder external rotation 15 23 23   (Blank rows = not tested)  AROM assessment deferred on eval due to patient only 2 weeks postop Active ROM Right eval Left eval  Shoulder flexion    Shoulder extension    Shoulder abduction    Shoulder adduction    Shoulder internal rotation    Shoulder external rotation    Elbow flexion    Elbow extension    Wrist flexion    Wrist extension    Wrist ulnar deviation    Wrist radial deviation    Wrist pronation    Wrist supination    (Blank rows = not tested)  UPPER EXTREMITY MMT: (Deferred on eval due to only 2 weeks postop)  MMT Right eval Left eval  Shoulder flexion    Shoulder extension    Shoulder abduction    Shoulder adduction    Shoulder internal rotation    Shoulder external rotation    Middle trapezius    Lower trapezius    Elbow flexion    Elbow extension    Wrist flexion    Wrist extension    Wrist ulnar deviation    Wrist radial deviation    Wrist pronation    Wrist supination    Grip strength (lbs)    (Blank rows = not tested)  PALPATION:  Mild TTP R pecs, biceps, teres group    TODAY'S TREATMENT:   04/23/23 THERAPEUTIC EXERCISE: to improve flexibility, strength and mobility.  Demonstration, verbal and tactile cues throughout for technique.  3 way swiffer AAROM for L shoulder 2x10 R shoulder isometrics flex, ABD, ext 5x5"  MANUAL THERAPY: To promote normalized muscle tension, improved flexibility, improved joint mobility, increased ROM, and reduced pain. R shoulder flexion, scaption and ER PROM within limits of protocol and pain tolerance  STM/DTM and manual TPR to R pecs, anterior and lateral deltoid Incisional scar massage   04/20/23 MANUAL THERAPY: To promote normalized muscle tension, improved flexibility, improved joint mobility, increased ROM, and reduced pain. R shoulder flexion, scaption and ER PROM within limits of protocol and pain tolerance  STM/DTM and manual TPR to R pecs, teres group and multiple areas in anterior and lateral deltoid Incisional scar massage to visible area of distal incision below steri-strips  THERAPEUTIC EXERCISE: to improve flexibility, strength and mobility.  Demonstration, verbal and tactile cues throughout for technique.  Seated B scapular retraction and depression 10 x 5" Seated B shoulder rolls 10x5" Seated R shoulder flexion PROM leaning forward with hand on swiffer post supporting arm 2 x 10 Seated R shoulder scaption PROM leaning diagonal forward with hand on swiffer post supporting arm 2 x 10 Seated R 3-way elbow  curls with elbow at side 2# x 10 each Seated R pronation/supination with arm supported on pillow in lap2# 2 x 10 Seated R wrist flexion & extension with arm supported on pillow in lap 2# x 10 each   04/16/23 MANUAL THERAPY: To promote normalized muscle tension, improved flexibility, improved joint mobility, increased ROM, and reduced pain. R shoulder flexion, scaption and ER PROM within limits of protocol and pain tolerance  STM/DTM and manual TPR to R pecs and multiple areas in anterior and lateral deltoid Incisional scar  massage to visible area of distal incision below steri-strips   04/13/23 THERAPEUTIC EXERCISE: to improve flexibility, strength and mobility.  Demonstration, verbal and tactile cues throughout for technique.  Seated table slide with green x 10 flexion Seated table slide with green x 10 abduction  MANUAL THERAPY: To promote improved flexibility, improved joint mobility, increased ROM, and reduced pain. R shoulder flexion, scaption and ER PROM within limits of protocol and pain tolerance  STM to R teres group, UT/LS   PATIENT EDUCATION:  Education details: progress with PT, ongoing PT POC, and continue HEP   Person educated: Patient Education method: Explanation Education comprehension: verbalized understanding  HOME EXERCISE PROGRAM: Access Code: [email or web address] URL: [email or web address] Date: 04/03/2023 Prepared by: Glenetta Hew  Exercises - Seated Shoulder Flexion Towel Slide at Table Top  - 2-3 x daily - 7 x weekly - 2 sets - 10 reps - 3 sec  hold - Seated Shoulder Scaption Slide at Table Top with Forearm in Neutral  - 2-3 x daily - 7 x weekly - 2 sets - 10 reps - 3 sec hold - Seated Shoulder Setting  - 2-3 x daily - 7 x weekly - 2 sets - 10 reps - 3 sec hold - Isometric Shoulder Flexion at Wall (Mirrored)  - 1-2 x daily - 7 x weekly - 2 sets - 5 reps - 5 sec hold - Isometric Shoulder Abduction at Wall (Mirrored)  - 1-2 x daily - 7 x weekly - 2 sets - 5 reps - 5 sec hold - Isometric Shoulder Extension at Wall (Mirrored)  - 1-2 x daily - 7 x weekly - 2 sets - 5 reps - 5 sec hold - Gentle Vertical Shoulder Pendulum  - 2-3 x daily - 7 x weekly - 2 sets - 10 reps - Gentle Horizontal Shoulder Pendulum  - 2-3 x daily - 7 x weekly - 2 sets - 10 reps - Circular Shoulder Pendulum with Table Support  - 2-3 x daily - 7 x weekly - 2 sets - 10 reps   ASSESSMENT:  CLINICAL IMPRESSION: Focused on STM and manual techniques to improve shoulder mobility. Pt now at 6 week mark of rehab  but had reports of tightness coming intoday. AROM remains about the same today compared to last visit measured but should gain more as we advance to next phase per MD instruction. [name] is progressing well per his rehab protocol and goals and will benefit from continued skilled PT to address ongoing deficits to improve mobility and activity tolerance with decreased pain interference.   OBJECTIVE IMPAIRMENTS: decreased activity tolerance, decreased coordination, decreased knowledge of condition, decreased ROM, decreased strength, increased fascial restrictions, impaired perceived functional ability, increased muscle spasms, impaired flexibility, impaired sensation, impaired UE functional use, postural dysfunction, and pain.   ACTIVITY LIMITATIONS: carrying, lifting, sleeping, bathing, dressing, reach over head, hygiene/grooming, and caring for others  PARTICIPATION LIMITATIONS: meal prep, cleaning, laundry, driving, shopping, community activity, and yard work  PERSONAL FACTORS: Past/current experiences, Time since onset of injury/illness/exacerbation, and 3+ comorbidities: R TKA 07/23/22, L TSA 11/01/20, anxiety, arthritis, back pain, osteoporosis, prediabetes, ventral hernia repair  are also affecting patient's functional outcome.   REHAB POTENTIAL: Good  CLINICAL DECISION MAKING: Stable/uncomplicated  EVALUATION COMPLEXITY: Low   GOALS: Goals reviewed with patient? Yes  SHORT TERM GOALS: Target date: 04/27/2023  Patient will be independent with initial HEP to improve outcomes and carryover.  Baseline:  Goal status: MET - 04/10/23  2.  Patient will report 25% improvement in R shoulder pain to improve tolerance for exercise and activity.  Baseline: 6/10 Goal status: MET - 04/10/23 - Pain 3-4/10  LONG TERM GOALS: Target date: 05/25/2023  Patient will be independent with ongoing/advanced HEP for self-management at home.  Baseline:  Goal status: IN PROGRESS  2.  Patient will report 75%  improvement in R shoulder pain to improve QOL.  Baseline: 6/10 Goal status: IN PROGRESS  3.  Patient to demonstrate improved upright posture with posterior shoulder girdle engaged to promote improved glenohumeral joint mobility. Baseline: Forward head and rounded shoulder posture Goal status: IN PROGRESS  4.  Patient to improve R shoulder AROM to [facility] without pain provocation to allow for increased ease of ADLs.  Baseline: Refer to above UE ROM table Goal status: IN PROGRESS  5.  Patient will demonstrate improved R shoulder strength to >/= 4 to 4+/5 for functional UE use. Baseline: Refer to above UE MMT table Goal status: IN PROGRESS  6  Patient will report </= 60% on QuickDASH to demonstrate improved functional ability (MDIC = 10).  Baseline: 70.5 / 100 = 70.5 % Goal status: IN PROGRESS  7.  Patient to report ability to perform ADLs, household, and leisure-related tasks without limitation due to R shoulder pain, LOM or weakness.   Baseline: Limited with all activities requiring use of R arm Goal status: IN PROGRESS    PLAN:  PT FREQUENCY: 2x/week  PT DURATION: 8 weeks  PLANNED INTERVENTIONS: Therapeutic exercises, Therapeutic activity, Neuromuscular re-education, Patient/Family education, Self Care, Joint mobilization, Dry Needling, Electrical stimulation, Cryotherapy, Moist heat, Taping, Vasopneumatic device, Ultrasound, Ionotophoresis 4mg /ml Dexamethasone, Manual therapy, and Re-evaluation  PLAN FOR NEXT SESSION: MD PN for 04/24/23 appt;  Per reverse TSA protocol (Sx 03/12/23) - post-op week #6 as of 04/23/23 - continue with PROM & begin AA/AROM - gradually increasing forward flexion and elevation in the scapular plane in supine, progressing to sitting/standing; periscapular deltoid submaximal pain-free isometrics in scapular plane, adding gentle GH IR & ER sub-max pain free isometrics; resisted exercises of elbow, wrist and hand   Darleene Cleaver, PTA 04/23/2023, 3:51 PM

## 2023-04-23 NOTE — Telephone Encounter (Signed)
Copied from CRM [id number]. Topic: Medicare AWV >> Apr 23, 2023  3:17 PM Payton Doughty wrote: Reason for CRM: Called LVM 04/23/19 24 to schedule Annual Wellness Visit  Verlee Rossetti; Care Guide Ambulatory Clinical Support [place] [facility] Direct Dial: [phone]

## 2023-04-24 ENCOUNTER — Ambulatory Visit (INDEPENDENT_AMBULATORY_CARE_PROVIDER_SITE_OTHER): Payer: Medicare Other | Admitting: Orthopedic Surgery

## 2023-04-24 ENCOUNTER — Telehealth: Payer: Self-pay | Admitting: *Deleted

## 2023-04-24 DIAGNOSIS — Z96651 Presence of right artificial knee joint: Secondary | ICD-10-CM

## 2023-04-24 DIAGNOSIS — M25561 Pain in right knee: Secondary | ICD-10-CM

## 2023-04-25 ENCOUNTER — Encounter: Payer: Self-pay | Admitting: Orthopedic Surgery

## 2023-04-25 NOTE — Progress Notes (Signed)
Post-Op Visit Note   Patient: [name]           Date of Birth: 1945-09-02           MRN: [id number] Visit Date: 04/24/2023 PCP: Bradd Canary, MD   Assessment & Plan:  Chief Complaint:  Chief Complaint  Patient presents with   Right Shoulder - Routine Post Op     RIGHT RSA (surgery date 03-12-23)     Visit Diagnoses:  1. Right knee pain, unspecified chronicity   2. S/P total knee arthroplasty, right     Plan: [name] is a patient who is now about 6 weeks out right reverse shoulder replacement.  Starting therapy in [facility].  Taking gabapentin and oxycodone for her shoulder.  Still having some right knee pain.  Radiographs last office visit were negative.  On examination he has improving shoulder range of motion and good functional deltoid.  At this time I want to aspirate that knee.  Infection parameters would be up following his shoulder replacement healing phase but I do not want anything to be going on in that knee that could potentially affect the shoulder.  Overall the shoulder is making progress but it still is a little bit stiff.  We talked about how the implant has definitely grown into the bone at this time so no worries about stressing the bone prosthetic interface.  Okay for range of motion as tolerated.  The right knee is sterilely prepped with alcohol Betadine and ChloraPrep and we aspirated about 20 cc out of the knee which appeared serous.  No evidence of infection.  Plan to send that for Gram stain cell count aerobic and anaerobic culture.  Follow-up in 6 weeks for clinical recheck.  Still unclear why a previously well-functioning total knee replacement has developed some pain.  Follow-Up Instructions: No follow-ups on file.   Orders:  Orders Placed This Encounter  Procedures   Anaerobic and Aerobic Culture   Synovial Fluid Analysis, Complete   No orders of the defined types were placed in this encounter.   Imaging: No results found.  PMFS  History: Patient Active Problem List   Diagnosis Date Noted   Primary osteoarthritis of right shoulder 03/14/2023   Biceps tendonitis on right 03/14/2023   Shoulder fracture 03/12/2023   S/P reverse total shoulder arthroplasty, right 03/12/2023   Arthritis of right knee 07/27/2022   S/P total knee arthroplasty, right 07/24/2022   Shoulder arthritis    S/P shoulder replacement, 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   Skin tags, multiple acquired 07/07/2019   LUQ pain 07/07/2019   OA (osteoarthritis) of knee 03/16/2019   Ventral hernia s/p lap repair with mesh 06/03/2018 06/03/2018   Hemorrhoids 08/22/2016   Anemia 03/16/2016   Right shoulder pain 02/26/2016   Abnormal CT scan, gastrointestinal tract suspect angioedema    Abdominal pain    Hyperglycemia 08/21/2013   Low back pain 10/10/2012   Hyperlipidemia    Hypertension, essential     Obesity (BMI 30.0-34.9)    Past Medical History:  Diagnosis Date   Abnormal CT scan, gastrointestinal tract suspect angioedema    Anemia 03/16/2016   IRON   Anxiety    Arthritis    Back pain 10/10/2012   Fatty liver 2005   seen on 2005 ultrasound,05/2015 CT.    Hemorrhoids 08/22/2016   Hemorrhoids, internal, with bleeding 2005   internal and external. 2005 band ligation (dr Noe Gens in [place])   Hyperglycemia 08/21/2013   Hyperlipidemia  Hypertension    Nocturia 08/21/2013   Obesity (BMI 30.0-34.9)    Osteoporosis    Pneumonia    as a child   Pre-diabetes    Rectal bleeding 07/30/2014   Renal cyst    Left Kidney   SBO (small bowel obstruction) (HCC)    Tubular adenoma of colon before 2005, 2012, 08/2014   Unspecified constipation 02/06/2014   Vitamin D deficiency     Family History  Problem Relation Age of Onset   Stroke Mother    Aneurysm Mother        brain   Arthritis Father    Liver cancer Father        liver, atomic testing in military   Gallbladder disease Father    Hyperlipidemia Sister    Hypertension Sister    Obesity Sister     Diabetes Sister    Diabetes Brother    Hyperlipidemia Sister    Hypertension Sister    Obesity Sister    Diabetes Sister    Aneurysm Brother        brain   COPD Brother    Gallbladder disease Sister        x4   Colon cancer Neg Hx    Colon polyps Neg Hx    Esophageal cancer Neg Hx    Rectal cancer Neg Hx    Stomach cancer Neg Hx    Prostate cancer Neg Hx    CAD Neg Hx     Past Surgical History:  Procedure Laterality Date   BICEPT TENODESIS Right 03/12/2023   Procedure: BICEPS TENODESIS;  Surgeon: Cammy Copa, MD;  Location: [facility] OR;  Service: Orthopedics;  Laterality: Right;   CATARACT EXTRACTION W/ INTRAOCULAR LENS IMPLANT Bilateral    COLONOSCOPY  before 2005, 2012, 2013, 08/2014.    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   Procedure: REVERSE SHOULDER ARTHROPLASTY;  Surgeon: Cammy Copa, MD;  Location: [facility] OR;  Service: Orthopedics;  Laterality: Right;   TONSILLECTOMY  1957   TOTAL KNEE ARTHROPLASTY Right 07/24/2022   Procedure: RIGHT TOTAL KNEE ARTHROPLASTY;  Surgeon: Cammy Copa, MD;  Location: [facility] OR;  Service: Orthopedics;  Laterality: Right;   TOTAL SHOULDER ARTHROPLASTY Left 11/01/2020   Procedure: LEFT ANATOMIC SHOULDER REPLACEMENT;  Surgeon: Cammy Copa, MD;  Location: [facility] OR;  Service: Orthopedics;  Laterality: Left;   VENTRAL HERNIA REPAIR N/A 06/03/2018   Procedure: LAPAROSCOPIC VENTRAL WALL  HERNIA  REPAIR WITH MESH  ERAS PATHWAY;  Surgeon: Karie Soda, MD;  Location: [facility]S;  Service: General;  Laterality: N/A;   Social History   Occupational History   Occupation: Retired     Associate Professor: [employer]  Tobacco Use   Smoking status: Never   Smokeless tobacco: Never  Vaping Use   Vaping status: Never Used  Substance and Sexual Activity   Alcohol use: Not Currently    Comment: Occassionally   Drug use: No   Sexual activity: Yes

## 2023-04-27 ENCOUNTER — Telehealth: Payer: Self-pay

## 2023-04-27 ENCOUNTER — Ambulatory Visit: Payer: Medicare Other | Admitting: Physical Therapy

## 2023-04-27 ENCOUNTER — Encounter: Payer: Self-pay | Admitting: Physical Therapy

## 2023-04-27 DIAGNOSIS — M6281 Muscle weakness (generalized): Secondary | ICD-10-CM

## 2023-04-27 DIAGNOSIS — R293 Abnormal posture: Secondary | ICD-10-CM

## 2023-04-27 DIAGNOSIS — M25511 Pain in right shoulder: Secondary | ICD-10-CM | POA: Diagnosis not present

## 2023-04-27 DIAGNOSIS — M25611 Stiffness of right shoulder, not elsewhere classified: Secondary | ICD-10-CM | POA: Diagnosis not present

## 2023-04-27 NOTE — Telephone Encounter (Signed)
Please schedule follow up per Dr August Saucer

## 2023-04-27 NOTE — Telephone Encounter (Signed)
-----   Message from Burnard Bunting sent at 04/25/2023  7:27 AM EDT ----- Plan follow-up 6 weeks length with Franky Macho

## 2023-04-27 NOTE — Therapy (Signed)
OUTPATIENT PHYSICAL THERAPY TREATMENT   Patient Name: [name] MRN: [id number] DOB:[birth year], 77 y.o., male Today's Date: 04/27/2023   END OF SESSION:  PT End of Session - 04/27/23 0922     Visit Number 9    Date for PT Re-Evaluation 05/25/23    Authorization Type Medicare & Tricare    PT Start Time 0922    PT Stop Time 1008    PT Time Calculation (min) 46 min    Activity Tolerance Patient tolerated treatment well    Behavior During Therapy [facility] for tasks assessed/performed                  Past Medical History:  Diagnosis Date   Abnormal CT scan, gastrointestinal tract suspect angioedema    Anemia 03/16/2016   IRON   Anxiety    Arthritis    Back pain 10/10/2012   Fatty liver 2005   seen on 2005 ultrasound,05/2015 CT.    Hemorrhoids 08/22/2016   Hemorrhoids, internal, with bleeding 2005   internal and external. 2005 band ligation (dr Noe Gens in [facility])   Hyperglycemia 08/21/2013   Hyperlipidemia    Hypertension    Nocturia 08/21/2013   Obesity (BMI 30.0-34.9)    Osteoporosis    Pneumonia    as a child   Pre-diabetes    Rectal bleeding 07/30/2014   Renal cyst    Left Kidney   SBO (small bowel obstruction) (HCC)    Tubular adenoma of colon before 2005, 2012, 08/2014   Unspecified constipation 02/06/2014   Vitamin D deficiency    Past Surgical History:  Procedure Laterality Date   BICEPT TENODESIS Right 03/12/2023   Procedure: BICEPS TENODESIS;  Surgeon: Cammy Copa, MD;  Location: [facility] OR;  Service: Orthopedics;  Laterality: Right;   CATARACT EXTRACTION W/ INTRAOCULAR LENS IMPLANT Bilateral    COLONOSCOPY  before 2005, 2012, 2013, 08/2014.    ENTEROSCOPY N/A 10/05/2017   Procedure: ENTEROSCOPY;  Surgeon: Iva Boop, MD;  Location: [facility] ENDOSCOPY;  Service: Endoscopy;  Laterality: N/A;   FRACTURE SURGERY Left 1989   leg   HEMORRHOID BANDING  2005   IR RADIOLOGIST EVAL & MGMT  11/11/2021   KNEE SURGERY Left 2010   arthroscopy, torn  carilage   KNEE SURGERY Right 2012   arthroscopy   REVERSE SHOULDER ARTHROPLASTY Right 03/12/2023   Procedure: REVERSE SHOULDER ARTHROPLASTY;  Surgeon: Cammy Copa, MD;  Location: [facility] OR;  Service: Orthopedics;  Laterality: Right;   TONSILLECTOMY  1957   TOTAL KNEE ARTHROPLASTY Right 07/24/2022   Procedure: RIGHT TOTAL KNEE ARTHROPLASTY;  Surgeon: Cammy Copa, MD;  Location: [facility] OR;  Service: Orthopedics;  Laterality: Right;   TOTAL SHOULDER ARTHROPLASTY Left 11/01/2020   Procedure: LEFT ANATOMIC SHOULDER REPLACEMENT;  Surgeon: Cammy Copa, MD;  Location: [facility] OR;  Service: Orthopedics;  Laterality: Left;   VENTRAL HERNIA REPAIR N/A 06/03/2018   Procedure: LAPAROSCOPIC VENTRAL WALL  HERNIA  REPAIR WITH MESH  ERAS PATHWAY;  Surgeon: Karie Soda, MD;  Location: [facility] ORS;  Service: General;  Laterality: N/A;   Patient Active Problem List   Diagnosis Date Noted   Primary osteoarthritis of right shoulder 03/14/2023   Biceps tendonitis on right 03/14/2023   Shoulder fracture 03/12/2023   S/P reverse total shoulder arthroplasty, right 03/12/2023   Arthritis of right knee 07/27/2022   S/P total knee arthroplasty, right 07/24/2022   Shoulder arthritis    S/P shoulder replacement, 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   Skin tags, multiple acquired 07/07/2019  LUQ pain 07/07/2019   OA (osteoarthritis) of knee 03/16/2019   Ventral hernia s/p lap repair with mesh 06/03/2018 06/03/2018   Hemorrhoids 08/22/2016   Anemia 03/16/2016   Right shoulder pain 02/26/2016   Abnormal CT scan, gastrointestinal tract suspect angioedema    Abdominal pain    Hyperglycemia 08/21/2013   Low back pain 10/10/2012   Hyperlipidemia    Hypertension, essential     Obesity (BMI 30.0-34.9)     PCP: Bradd Canary, MD   REFERRING PROVIDER: Cammy Copa, MD   REFERRING DIAG:  [phone] (ICD-10-CM) - S/P reverse total shoulder arthroplasty, right  M19.011 (ICD-10-CM) - Primary osteoarthritis of right shoulder   M75.21 (ICD-10-CM) - Biceps tendonitis on right    THERAPY DIAG:  Stiffness of right shoulder, not elsewhere classified  Acute pain of right shoulder  Abnormal posture  Muscle weakness (generalized)  RATIONALE FOR EVALUATION AND TREATMENT: Rehabilitation  ONSET DATE: 03/12/23 - R reverse TSA + biceps tenodesis  NEXT MD VISIT: 04/24/23   SUBJECTIVE:                                                                                                                                                                                                         SUBJECTIVE STATEMENT: MD progress note for post-op f/u on 04/24/23 indicates: Overall the shoulder is making progress but it still is a little bit stiff.  Okay for range of motion as tolerated.   EVAL: Pt reports the PA released him from the sling as of his postop follow-up on Friday 03/27/23. He states he had the CPM at home 3x/day x 1.5 hrs for the first 2 weeks post, but now discontinued. Biggest concerns currently with opening jars and feeding himself with R hand. Still sleeping in the recliner as the bed is too uncomfortable. He has been completing distal UE HEP exercises 3x/day.  PAIN: Are you having pain? Yes: NPRS scale:  3/10 Pain location: R anterior shoulder  Pain description: sore, tightness Aggravating factors: raising his arm too high, working with the mouse on the computer Relieving factors: oxycodone, gabapentin and muscle relaxant, ice  PERTINENT HISTORY:  R TKA 07/23/22, L TSA 11/01/20, anxiety, arthritis, back pain, osteoporosis, prediabetes, ventral hernia repair  PRECAUTIONS: Shoulder  RED FLAGS: None  HAND DOMINANCE: Right  WEIGHT BEARING RESTRICTIONS: Yes NWB R UE  FALLS:  Has patient fallen in last 6 months? No  LIVING ENVIRONMENT: Lives with: lives with their spouse Lives in: House/apartment Stairs: Yes: Internal: 12 steps; on right going up and External: 2 steps; none  Has following equipment at home:  Single point cane and Walker - 2 wheeled  OCCUPATION: Retired  PLOF: Independent and Leisure: Golf, yard work, gym - bike    PATIENT GOALS: "Better ROM and less pain."   OBJECTIVE: (objective measures completed at initial evaluation unless otherwise dated)  DIAGNOSTIC FINDINGS:  03/12/23 - DG Right shoulder - IMPRESSION: Interval reverse right shoulder arthroplasty without evidence ofhardware failure or loosening.  PATIENT SURVEYS:  Quick Dash 70.5 / 100 = 70.5 %  COGNITION: Overall cognitive status: Within functional limits for tasks assessed     SENSATION: WFL Pt reports intermittent gnawing "toothache" feeling in his L thumb, tingling has subsided  POSTURE: rounded shoulders and forward head  UPPER EXTREMITY ROM:   Passive ROM Right eval R 04/10/23 R 04/23/23  Shoulder flexion 100 120 120  Shoulder extension     Shoulder abduction 88 108 110  Shoulder adduction     Shoulder internal rotation     Shoulder external rotation 15 23 23   (Blank rows = not tested)  AROM assessment deferred on eval due to patient only 2 weeks postop Active ROM Right eval Left eval  Shoulder flexion    Shoulder extension    Shoulder abduction    Shoulder adduction    Shoulder internal rotation    Shoulder external rotation    Elbow flexion    Elbow extension    Wrist flexion    Wrist extension    Wrist ulnar deviation    Wrist radial deviation    Wrist pronation    Wrist supination    (Blank rows = not tested)  UPPER EXTREMITY MMT: (Deferred on eval due to only 2 weeks postop)  MMT Right eval Left eval  Shoulder flexion    Shoulder extension    Shoulder abduction    Shoulder adduction    Shoulder internal rotation    Shoulder external rotation    Middle trapezius    Lower trapezius    Elbow flexion    Elbow extension    Wrist flexion    Wrist extension    Wrist ulnar deviation    Wrist radial deviation    Wrist pronation    Wrist supination    Grip strength (lbs)     (Blank rows = not tested)  PALPATION:  Mild TTP R pecs, biceps, teres group    TODAY'S TREATMENT:   04/27/23 THERAPEUTIC EXERCISE: to improve flexibility, strength and mobility.  Demonstration, verbal and tactile cues throughout for technique. Pulleys - 3' each flexion and scaption in pain free ROM Supine R shoulder flexion AAROM with wand to tolerance 2 x 10 Supine R shoulder scaption AAROM with wand to tolerance 2 x 10 Supine R shoulder ER AAROM with wand to tolerance (towel roll under arm) 2 x 10 Seated self-resisted R shoulder IR & ER submax (30-50% effort) isometrics 10 x 5" each Standing shoulder YTB scap retraction + shoulder row 10 x 5" Standing shoulder YTB scap retraction + shoulder extension 10 x 5"  MANUAL THERAPY: To promote normalized muscle tension, improved flexibility, improved joint mobility, increased ROM, and reduced pain. R shoulder flexion, scaption and ER PROM within limits of protocol and pain tolerance  STM/DTM and manual TPR to R pecs Incisional scar massage to full incision   04/23/23 THERAPEUTIC EXERCISE: to improve flexibility, strength and mobility.  Demonstration, verbal and tactile cues throughout for technique.  3 way swiffer AAROM for R shoulder 2x10 R shoulder isometrics flex, ABD, ext 5x5"  MANUAL THERAPY: To promote normalized muscle tension, improved flexibility, improved joint mobility, increased ROM, and reduced pain. R shoulder flexion, scaption and ER PROM within limits of protocol and pain tolerance  STM/DTM and manual TPR to R pecs, anterior and lateral deltoid Incisional scar massage    04/20/23 MANUAL THERAPY: To promote normalized muscle tension, improved flexibility, improved joint mobility, increased ROM, and reduced pain. R shoulder flexion, scaption and ER PROM within limits of protocol and pain tolerance  STM/DTM and manual TPR to R pecs, teres group and multiple areas in anterior and lateral deltoid Incisional scar massage  to visible area of distal incision below steri-strips  THERAPEUTIC EXERCISE: to improve flexibility, strength and mobility.  Demonstration, verbal and tactile cues throughout for technique.  Seated B scapular retraction and depression 10 x 5" Seated B shoulder rolls 10x5" Seated R shoulder flexion PROM leaning forward with hand on swiffer post supporting arm 2 x 10 Seated R shoulder scaption PROM leaning diagonal forward with hand on swiffer post supporting arm 2 x 10 Seated R 3-way elbow curls with elbow at side 2# x 10 each Seated R pronation/supination with arm supported on pillow in lap2# 2 x 10 Seated R wrist flexion & extension with arm supported on pillow in lap 2# x 10 each   PATIENT EDUCATION:  Education details: HEP progression - shoulder wand AAROM, shoulder IR/ER isometrics   Person educated: Patient Education method: Programmer, multimedia, Demonstration, Verbal cues, and Handouts Education comprehension: verbalized understanding, returned demonstration, verbal cues required, and needs further education  HOME EXERCISE PROGRAM: Access Code: [email or web address] URL: [email or web address] Date: 04/27/2023 Prepared by: Glenetta Hew  Exercises - Seated Shoulder Flexion Towel Slide at Table Top  - 2-3 x daily - 7 x weekly - 2 sets - 10 reps - 3 sec  hold - Seated Shoulder Scaption Slide at Table Top with Forearm in Neutral  - 2-3 x daily - 7 x weekly - 2 sets - 10 reps - 3 sec hold - Seated Shoulder Setting  - 2-3 x daily - 7 x weekly - 2 sets - 10 reps - 3 sec hold - Isometric Shoulder Flexion at Wall (Mirrored)  - 1-2 x daily - 7 x weekly - 2 sets - 5 reps - 5 sec hold - Isometric Shoulder Abduction at Wall (Mirrored)  - 1-2 x daily - 7 x weekly - 2 sets - 5 reps - 5 sec hold - Isometric Shoulder Extension at Wall (Mirrored)  - 1-2 x daily - 7 x weekly - 2 sets - 5 reps - 5 sec hold - Gentle Vertical Shoulder Pendulum  - 2-3 x daily - 7 x weekly - 2 sets - 10 reps - Gentle Horizontal  Shoulder Pendulum  - 2-3 x daily - 7 x weekly - 2 sets - 10 reps - Circular Shoulder Pendulum with Table Support  - 2-3 x daily - 7 x weekly - 2 sets - 10 reps - Supine Shoulder Flexion AAROM with Dowel  - 1-2 x daily - 7 x weekly - 2 sets - 10 reps - 3 sec hold - Supine Shoulder Scaption with Dowel  - 1-2 x daily - 7 x weekly - 2 sets - 10 reps - 3 sec hold - Supine Shoulder External Rotation with Dowel at 20-30 Degrees of Abduction  - 1-2 x daily - 7 x weekly - 2 sets - 10 reps - 3 sec hold - Isometric Shoulder Internal Rotation (Mirrored)  - 1 x daily - 7  x weekly - 2 sets - 5 reps - 5 sec hold - Isometric Shoulder External Rotation (Mirrored)  - 1 x daily - 7 x weekly - 2 sets - 5 reps - 5 sec hold   ASSESSMENT:  CLINICAL IMPRESSION: Joden cleared by MD to progress ROM as tolerated as of his post-op f/u with MD on 04/24/23. Introduced pain free AAROM with pulleys and wand with cues to avoid forcing motion.  Progressed R shoulder isometrics to include submax IR & ER with good tolerance. Introduced scapular strengthening/stabilization with YTB rows - cues necessary for good scap activation. HEP updated to reflect some of exercise progression but deferred scapular strengthening until further review/attempts during PT sessions. Mory is progressing well per his rehab protocol and goals and will benefit from continued skilled PT to address ongoing deficits to improve mobility and activity tolerance with decreased pain interference.   OBJECTIVE IMPAIRMENTS: decreased activity tolerance, decreased coordination, decreased knowledge of condition, decreased ROM, decreased strength, increased fascial restrictions, impaired perceived functional ability, increased muscle spasms, impaired flexibility, impaired sensation, impaired UE functional use, postural dysfunction, and pain.   ACTIVITY LIMITATIONS: carrying, lifting, sleeping, bathing, dressing, reach over head, hygiene/grooming, and caring for  others  PARTICIPATION LIMITATIONS: meal prep, cleaning, laundry, driving, shopping, community activity, and yard work  PERSONAL FACTORS: Past/current experiences, Time since onset of injury/illness/exacerbation, and 3+ comorbidities: R TKA 07/23/22, L TSA 11/01/20, anxiety, arthritis, back pain, osteoporosis, prediabetes, ventral hernia repair  are also affecting patient's functional outcome.   REHAB POTENTIAL: Good  CLINICAL DECISION MAKING: Stable/uncomplicated  EVALUATION COMPLEXITY: Low   GOALS: Goals reviewed with patient? Yes  SHORT TERM GOALS: Target date: 04/27/2023  Patient will be independent with initial HEP to improve outcomes and carryover.  Baseline:  Goal status: MET - 04/10/23  2.  Patient will report 25% improvement in R shoulder pain to improve tolerance for exercise and activity.  Baseline: 6/10 Goal status: MET - 04/10/23 - Pain 3-4/10  LONG TERM GOALS: Target date: 05/25/2023  Patient will be independent with ongoing/advanced HEP for self-management at home.  Baseline:  Goal status: IN PROGRESS - 04/27/23 - met for current HEP, updated today  2.  Patient will report 75% improvement in R shoulder pain to improve QOL.  Baseline: 6/10 Goal status: IN PROGRESS  3.  Patient to demonstrate improved upright posture with posterior shoulder girdle engaged to promote improved glenohumeral joint mobility. Baseline: Forward head and rounded shoulder posture Goal status: IN PROGRESS  4.  Patient to improve R shoulder AROM to [facility] without pain provocation to allow for increased ease of ADLs.  Baseline: Refer to above UE ROM table Goal status: IN PROGRESS  5.  Patient will demonstrate improved R shoulder strength to >/= 4 to 4+/5 for functional UE use. Baseline: Refer to above UE MMT table Goal status: IN PROGRESS  6  Patient will report </= 60% on QuickDASH to demonstrate improved functional ability (MDIC = 10).  Baseline: 70.5 / 100 = 70.5 % Goal status: IN  PROGRESS  7.  Patient to report ability to perform ADLs, household, and leisure-related tasks without limitation due to R shoulder pain, LOM or weakness.   Baseline: Limited with all activities requiring use of R arm Goal status: IN PROGRESS    PLAN:  PT FREQUENCY: 2x/week  PT DURATION: 8 weeks  PLANNED INTERVENTIONS: Therapeutic exercises, Therapeutic activity, Neuromuscular re-education, Patient/Family education, Self Care, Joint mobilization, Dry Needling, Electrical stimulation, Cryotherapy, Moist heat, Taping, Vasopneumatic device, Ultrasound, Ionotophoresis 4mg /ml  Dexamethasone, Manual therapy, and Re-evaluation  PLAN FOR NEXT SESSION: Per reverse TSA protocol (Sx 03/12/23) - post-op week #7 as of 04/30/23 - continue with PROM & gentle AA/AROM - gradually increasing forward flexion and elevation in the scapular plane in supine, progressing to sitting/standing; periscapular deltoid submaximal pain-free isometrics in scapular plane and gentle GH IR & ER sub-max pain free isometrics; resisted exercises of elbow, wrist and hand   Marry Guan, PT 04/27/2023, 10:20 AM

## 2023-04-30 ENCOUNTER — Ambulatory Visit: Payer: Medicare Other

## 2023-04-30 DIAGNOSIS — M25611 Stiffness of right shoulder, not elsewhere classified: Secondary | ICD-10-CM | POA: Diagnosis not present

## 2023-04-30 DIAGNOSIS — R293 Abnormal posture: Secondary | ICD-10-CM | POA: Diagnosis not present

## 2023-04-30 DIAGNOSIS — N401 Enlarged prostate with lower urinary tract symptoms: Secondary | ICD-10-CM | POA: Diagnosis not present

## 2023-04-30 DIAGNOSIS — M6281 Muscle weakness (generalized): Secondary | ICD-10-CM

## 2023-04-30 DIAGNOSIS — M25511 Pain in right shoulder: Secondary | ICD-10-CM

## 2023-04-30 DIAGNOSIS — N528 Other male erectile dysfunction: Secondary | ICD-10-CM | POA: Diagnosis not present

## 2023-04-30 DIAGNOSIS — R3912 Poor urinary stream: Secondary | ICD-10-CM | POA: Diagnosis not present

## 2023-04-30 LAB — SYNOVIAL FLUID ANALYSIS, COMPLETE
Basophils, %: 0 %
Eosinophils-Synovial: 0 % (ref 0–2)
Lymphocytes-Synovial Fld: 60 % (ref 0–74)
Monocyte/Macrophage: 40 % (ref 0–69)
Neutrophil, Synovial: 0 % (ref 0–24)
Synoviocytes, %: 0 % (ref 0–15)
WBC, Synovial: 1550 {cells}/uL — ABNORMAL HIGH (ref <150)

## 2023-04-30 LAB — ANAEROBIC AND AEROBIC CULTURE
AER RESULT:: NO GROWTH
GRAM STAIN:: NONE SEEN
MICRO NUMBER:: 15644983
MICRO NUMBER:: 15644984
SPECIMEN QUALITY:: ADEQUATE
SPECIMEN QUALITY:: ADEQUATE

## 2023-04-30 NOTE — Therapy (Addendum)
OUTPATIENT PHYSICAL THERAPY TREATMENT  Progress Note  Reporting Period 03/30/2023 to 04/30/2023   See note below for Objective Data and Assessment of Progress/Goals.     Patient Name: [name] MRN: [id number] DOB:1945-12-01, 77 y.o., male Today's Date: 04/30/2023   END OF SESSION:  PT End of Session - 04/30/23 0942     Visit Number 10    Date for PT Re-Evaluation 05/25/23    Authorization Type Medicare & Tricare    PT Start Time 0935    PT Stop Time 1027    PT Time Calculation (min) 52 min    Activity Tolerance Patient tolerated treatment well    Behavior During Therapy [facility] for tasks assessed/performed                    Past Medical History:  Diagnosis Date   Abnormal CT scan, gastrointestinal tract suspect angioedema    Anemia 03/16/2016   IRON   Anxiety    Arthritis    Back pain 10/10/2012   Fatty liver 2005   seen on 2005 ultrasound,05/2015 CT.    Hemorrhoids 08/22/2016   Hemorrhoids, internal, with bleeding 2005   internal and external. 2005 band ligation (dr Noe Gens in [facility])   Hyperglycemia 08/21/2013   Hyperlipidemia    Hypertension    Nocturia 08/21/2013   Obesity (BMI 30.0-34.9)    Osteoporosis    Pneumonia    as a child   Pre-diabetes    Rectal bleeding 07/30/2014   Renal cyst    Left Kidney   SBO (small bowel obstruction) (HCC)    Tubular adenoma of colon before 2005, 2012, 08/2014   Unspecified constipation 02/06/2014   Vitamin D deficiency    Past Surgical History:  Procedure Laterality Date   BICEPT TENODESIS Right 03/12/2023   Procedure: BICEPS TENODESIS;  Surgeon: Cammy Copa, MD;  Location: [facility] OR;  Service: Orthopedics;  Laterality: Right;   CATARACT EXTRACTION W/ INTRAOCULAR LENS IMPLANT Bilateral    COLONOSCOPY  before 2005, 2012, 2013, 08/2014.    ENTEROSCOPY N/A 10/05/2017   Procedure: ENTEROSCOPY;  Surgeon: Iva Boop, MD;  Location: [facility] ENDOSCOPY;  Service: Endoscopy;  Laterality: N/A;   FRACTURE  SURGERY Left 1989   leg   HEMORRHOID BANDING  2005   IR RADIOLOGIST EVAL & MGMT  11/11/2021   KNEE SURGERY Left 2010   arthroscopy, torn carilage   KNEE SURGERY Right 2012   arthroscopy   REVERSE SHOULDER ARTHROPLASTY Right 03/12/2023   Procedure: REVERSE SHOULDER ARTHROPLASTY;  Surgeon: Cammy Copa, MD;  Location: [facility] OR;  Service: Orthopedics;  Laterality: Right;   TONSILLECTOMY  1957   TOTAL KNEE ARTHROPLASTY Right 07/24/2022   Procedure: RIGHT TOTAL KNEE ARTHROPLASTY;  Surgeon: Cammy Copa, MD;  Location: [facility] OR;  Service: Orthopedics;  Laterality: Right;   TOTAL SHOULDER ARTHROPLASTY Left 11/01/2020   Procedure: LEFT ANATOMIC SHOULDER REPLACEMENT;  Surgeon: Cammy Copa, MD;  Location: [facility] OR;  Service: Orthopedics;  Laterality: Left;   VENTRAL HERNIA REPAIR N/A 06/03/2018   Procedure: LAPAROSCOPIC VENTRAL WALL  HERNIA  REPAIR WITH MESH  ERAS PATHWAY;  Surgeon: Karie Soda, MD;  Location: [facility] ORS;  Service: General;  Laterality: N/A;   Patient Active Problem List   Diagnosis Date Noted   Primary osteoarthritis of right shoulder 03/14/2023   Biceps tendonitis on right 03/14/2023   Shoulder fracture 03/12/2023   S/P reverse total shoulder arthroplasty, right 03/12/2023   Arthritis of right knee 07/27/2022  S/P total knee arthroplasty, right 07/24/2022   Shoulder arthritis    S/P shoulder replacement, 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   Skin tags, multiple acquired 07/07/2019   LUQ pain 07/07/2019   OA (osteoarthritis) of knee 03/16/2019   Ventral hernia s/p lap repair with mesh 06/03/2018 06/03/2018   Hemorrhoids 08/22/2016   Anemia 03/16/2016   Right shoulder pain 02/26/2016   Abnormal CT scan, gastrointestinal tract suspect angioedema    Abdominal pain    Hyperglycemia 08/21/2013   Low back pain 10/10/2012   Hyperlipidemia    Hypertension, essential     Obesity (BMI 30.0-34.9)     PCP: Bradd Canary, MD   REFERRING PROVIDER: Cammy Copa, MD   REFERRING  DIAG:  [phone] (ICD-10-CM) - S/P reverse total shoulder arthroplasty, right  M19.011 (ICD-10-CM) - Primary osteoarthritis of right shoulder  M75.21 (ICD-10-CM) - Biceps tendonitis on right   THERAPY DIAG:  Stiffness of right shoulder, not elsewhere classified  Acute pain of right shoulder  Abnormal posture  Muscle weakness (generalized)  RATIONALE FOR EVALUATION AND TREATMENT: Rehabilitation  ONSET DATE: 03/12/23 - R reverse TSA + biceps tenodesis  NEXT MD VISIT: 12/6 [facility] ongoing eval about about 10/24   SUBJECTIVE:                                                                                                                                                                                                         SUBJECTIVE STATEMENT: Pt reports his shoulder feeling tight, MD also drained fluid from knee.  EVAL: Pt reports the PA released him from the sling as of his postop follow-up on Friday 03/27/23. He states he had the CPM at home 3x/day x 1.5 hrs for the first 2 weeks post, but now discontinued. Biggest concerns currently with opening jars and feeding himself with R hand. Still sleeping in the recliner as the bed is too uncomfortable. He has been completing distal UE HEP exercises 3x/day.  PAIN: Are you having pain? Yes: NPRS scale:  3/10 Pain location: R anterior shoulder  Pain description: sore, tightness Aggravating factors: raising his arm too high, working with the mouse on the computer Relieving factors: oxycodone, gabapentin and muscle relaxant, ice  PERTINENT HISTORY:  R TKA 07/23/22, L TSA 11/01/20, anxiety, arthritis, back pain, osteoporosis, prediabetes, ventral hernia repair  PRECAUTIONS: Shoulder  RED FLAGS: None  HAND DOMINANCE: Right  WEIGHT BEARING RESTRICTIONS: Yes NWB R UE  FALLS:  Has patient fallen in last 6 months? No  LIVING ENVIRONMENT: Lives with: lives with their spouse Lives in: House/apartment Stairs:  Yes: Internal: 12 steps; on  right going up and External: 2 steps; none Has following equipment at home: Single point cane and Walker - 2 wheeled  OCCUPATION: Retired  PLOF: Independent and Leisure: Golf, yard work, gym - bike    PATIENT GOALS: "Better ROM and less pain."   OBJECTIVE: (objective measures completed at initial evaluation unless otherwise dated)  DIAGNOSTIC FINDINGS:  03/12/23 - DG Right shoulder - IMPRESSION: Interval reverse right shoulder arthroplasty without evidence ofhardware failure or loosening.  PATIENT SURVEYS:  Quick Dash 70.5 / 100 = 70.5 %  COGNITION: Overall cognitive status: Within functional limits for tasks assessed     SENSATION: WFL Pt reports intermittent gnawing "toothache" feeling in his L thumb, tingling has subsided  POSTURE: rounded shoulders and forward head  UPPER EXTREMITY ROM:   Passive ROM Right eval R 04/10/23 R 04/23/23  Shoulder flexion 100 120 120  Shoulder extension     Shoulder abduction 88 108 110  Shoulder adduction     Shoulder internal rotation     Shoulder external rotation 15 23 23   (Blank rows = not tested)  AROM assessment deferred on eval due to patient only 2 weeks postop Active ROM Right  supine 04/30/23  Shoulder flexion 140  Shoulder extension   Shoulder abduction 110  Shoulder adduction   Shoulder internal rotation   Shoulder external rotation 15  Elbow flexion   Elbow extension   Wrist flexion   Wrist extension   Wrist ulnar deviation   Wrist radial deviation   Wrist pronation   Wrist supination   (Blank rows = not tested)  UPPER EXTREMITY MMT: (Deferred on eval due to only 2 weeks postop)  MMT Right eval Left eval  Shoulder flexion    Shoulder extension    Shoulder abduction    Shoulder adduction    Shoulder internal rotation    Shoulder external rotation    Middle trapezius    Lower trapezius    Elbow flexion    Elbow extension    Wrist flexion    Wrist extension    Wrist ulnar deviation    Wrist radial  deviation    Wrist pronation    Wrist supination    Grip strength (lbs)    (Blank rows = not tested)  PALPATION:  Mild TTP R pecs, biceps, teres group    TODAY'S TREATMENT:   04/30/23 THERAPEUTIC EXERCISE: to improve flexibility, strength and mobility.  Demonstration, verbal and tactile cues throughout for technique. Pulleys - 3' each flexion and scaption in pain free ROM Supine R shoulder flexion AAROM with wand to tolerance 2 x 10 Supine R shoulder scaption AAROM with wand to tolerance 2 x 10 Supine R shoulder ER AAROM with wand to tolerance (towel roll under arm) 2 x 10  MANUAL THERAPY: To promote normalized muscle tension, improved flexibility, improved joint mobility, increased ROM, and reduced pain. R shoulder flexion, scaption and ER PROM to tolerance  Vaso to R shoulder 34 deg low compression x 10 min   04/27/23 THERAPEUTIC EXERCISE: to improve flexibility, strength and mobility.  Demonstration, verbal and tactile cues throughout for technique. Pulleys - 3' each flexion and scaption in pain free ROM Supine R shoulder flexion AAROM with wand to tolerance 2 x 10 Supine R shoulder scaption AAROM with wand to tolerance 2 x 10 Supine R shoulder ER AAROM with wand to tolerance (towel roll under arm) 2 x 10 Seated self-resisted R shoulder IR & ER submax (30-50% effort) isometrics  10 x 5" each Standing shoulder YTB scap retraction + shoulder row 10 x 5" Standing shoulder YTB scap retraction + shoulder extension 10 x 5"  MANUAL THERAPY: To promote normalized muscle tension, improved flexibility, improved joint mobility, increased ROM, and reduced pain. R shoulder flexion, scaption and ER PROM within limits of protocol and pain tolerance  STM/DTM and manual TPR to R pecs Incisional scar massage to full incision   04/23/23 THERAPEUTIC EXERCISE: to improve flexibility, strength and mobility.  Demonstration, verbal and tactile cues throughout for technique.  3 way swiffer AAROM  for R shoulder 2x10 R shoulder isometrics flex, ABD, ext 5x5"  MANUAL THERAPY: To promote normalized muscle tension, improved flexibility, improved joint mobility, increased ROM, and reduced pain. R shoulder flexion, scaption and ER PROM within limits of protocol and pain tolerance  STM/DTM and manual TPR to R pecs, anterior and lateral deltoid Incisional scar massage    PATIENT EDUCATION:  Education details: HEP progression - shoulder wand AAROM, shoulder IR/ER isometrics   Person educated: Patient Education method: Programmer, multimedia, Demonstration, Verbal cues, and Handouts Education comprehension: verbalized understanding, returned demonstration, verbal cues required, and needs further education  HOME EXERCISE PROGRAM: Access Code: [email or web address] URL: [email or web address] Date: 04/27/2023 Prepared by: Glenetta Hew  Exercises - Seated Shoulder Flexion Towel Slide at Table Top  - 2-3 x daily - 7 x weekly - 2 sets - 10 reps - 3 sec  hold - Seated Shoulder Scaption Slide at Table Top with Forearm in Neutral  - 2-3 x daily - 7 x weekly - 2 sets - 10 reps - 3 sec hold - Seated Shoulder Setting  - 2-3 x daily - 7 x weekly - 2 sets - 10 reps - 3 sec hold - Isometric Shoulder Flexion at Wall (Mirrored)  - 1-2 x daily - 7 x weekly - 2 sets - 5 reps - 5 sec hold - Isometric Shoulder Abduction at Wall (Mirrored)  - 1-2 x daily - 7 x weekly - 2 sets - 5 reps - 5 sec hold - Isometric Shoulder Extension at Wall (Mirrored)  - 1-2 x daily - 7 x weekly - 2 sets - 5 reps - 5 sec hold - Gentle Vertical Shoulder Pendulum  - 2-3 x daily - 7 x weekly - 2 sets - 10 reps - Gentle Horizontal Shoulder Pendulum  - 2-3 x daily - 7 x weekly - 2 sets - 10 reps - Circular Shoulder Pendulum with Table Support  - 2-3 x daily - 7 x weekly - 2 sets - 10 reps - Supine Shoulder Flexion AAROM with Dowel  - 1-2 x daily - 7 x weekly - 2 sets - 10 reps - 3 sec hold - Supine Shoulder Scaption with Dowel  - 1-2 x daily -  7 x weekly - 2 sets - 10 reps - 3 sec hold - Supine Shoulder External Rotation with Dowel at 20-30 Degrees of Abduction  - 1-2 x daily - 7 x weekly - 2 sets - 10 reps - 3 sec hold - Isometric Shoulder Internal Rotation (Mirrored)  - 1 x daily - 7 x weekly - 2 sets - 5 reps - 5 sec hold - Isometric Shoulder External Rotation (Mirrored)  - 1 x daily - 7 x weekly - 2 sets - 5 reps - 5 sec hold   ASSESSMENT:  CLINICAL IMPRESSION: Pt demonsrates good AROM of R shoulder with flex and ABD, ER is pretty limited. He is now able to drink from  a cup and shave himself. He notes 70% improvement in his pain.  At this point MD wants to emphasize ROM at this phase, he is 6 weeks post op reverse TSA. He does show the need to continue emphasizing ROM to improve functional mobility of his L shoulder. Dantae is progressing well per his rehab protocol and goals and will benefit from continued skilled PT to address ongoing deficits to improve mobility and activity tolerance with decreased pain interference.   OBJECTIVE IMPAIRMENTS: decreased activity tolerance, decreased coordination, decreased knowledge of condition, decreased ROM, decreased strength, increased fascial restrictions, impaired perceived functional ability, increased muscle spasms, impaired flexibility, impaired sensation, impaired UE functional use, postural dysfunction, and pain.   ACTIVITY LIMITATIONS: carrying, lifting, sleeping, bathing, dressing, reach over head, hygiene/grooming, and caring for others  PARTICIPATION LIMITATIONS: meal prep, cleaning, laundry, driving, shopping, community activity, and yard work  PERSONAL FACTORS: Past/current experiences, Time since onset of injury/illness/exacerbation, and 3+ comorbidities: R TKA 07/23/22, L TSA 11/01/20, anxiety, arthritis, back pain, osteoporosis, prediabetes, ventral hernia repair  are also affecting patient's functional outcome.   REHAB POTENTIAL: Good  CLINICAL DECISION MAKING:  Stable/uncomplicated  EVALUATION COMPLEXITY: Low   GOALS: Goals reviewed with patient? Yes  SHORT TERM GOALS: Target date: 04/27/2023  Patient will be independent with initial HEP to improve outcomes and carryover.  Baseline:  Goal status: MET - 04/10/23  2.  Patient will report 25% improvement in R shoulder pain to improve tolerance for exercise and activity.  Baseline: 6/10 Goal status: MET - 04/10/23 - Pain 3-4/10  LONG TERM GOALS: Target date: 05/25/2023  Patient will be independent with ongoing/advanced HEP for self-management at home.  Baseline:  Goal status: IN PROGRESS - 04/27/23 - met for current HEP, updated today  2.  Patient will report 75% improvement in R shoulder pain to improve QOL.  Baseline: 6/10 Goal status: IN PROGRESS - 04/30/23 - 70%   3.  Patient to demonstrate improved upright posture with posterior shoulder girdle engaged to promote improved glenohumeral joint mobility. Baseline: Forward head and rounded shoulder posture Goal status: IN PROGRESS  4.  Patient to improve R shoulder AROM to [facility] without pain provocation to allow for increased ease of ADLs.  Baseline: Refer to above UE ROM table Goal status: IN PROGRESS - 04/30/23 - see chart  5.  Patient will demonstrate improved R shoulder strength to >/= 4 to 4+/5 for functional UE use. Baseline: Refer to above UE MMT table Goal status: IN PROGRESS - 04/30/23 - non testing now  6  Patient will report </= 60% on QuickDASH to demonstrate improved functional ability (MDIC = 10).  Baseline: 70.5 / 100 = 70.5 % Goal status: IN PROGRESS  7.  Patient to report ability to perform ADLs, household, and leisure-related tasks without limitation due to R shoulder pain, LOM or weakness.   Baseline: Limited with all activities requiring use of R arm Goal status: IN PROGRESS - 04/30/23 - pt now able to shave, drink out of a cup   PLAN:  PT FREQUENCY: 2x/week  PT DURATION: 8 weeks  PLANNED INTERVENTIONS:  Therapeutic exercises, Therapeutic activity, Neuromuscular re-education, Patient/Family education, Self Care, Joint mobilization, Dry Needling, Electrical stimulation, Cryotherapy, Moist heat, Taping, Vasopneumatic device, Ultrasound, Ionotophoresis 4mg /ml Dexamethasone, Manual therapy, and Re-evaluation  PLAN FOR NEXT SESSION: Per reverse TSA protocol (Sx 03/12/23) - post-op week #7 as of 04/30/23 - continue with PROM & gentle AA/AROM - gradually increasing forward flexion and elevation in the scapular plane in supine,  progressing to sitting/standing; periscapular deltoid submaximal pain-free isometrics in scapular plane and gentle GH IR & ER sub-max pain free isometrics; resisted exercises of elbow, wrist and hand   Darleene Cleaver, PTA 04/30/2023, 9:38 AM   [name] has recently been cleared by MD to progress to R shoulder AA/AROM and continues to demonstrate improving ROM as compared to recent PROM measurements with only exception being R shoulder ER.  He continues to note limitation due to feeling of tightness in the R shoulder and has demonstrated increased/abnormal muscle tension and some of R shoulder complex musculature but this has been improving with manual therapy.  MMT deferred as patient just recently progressing to AA/AROM.  His pain has been much better managed with patient reporting 70% improvement.  Kolsyn is progressing well per his rehab protocol and goals, and has met all of his STG's with progress noted towards LTG's.  He will benefit from continued skilled PT to address remaining deficits to improve mobility and activity tolerance with decreased pain interference.  Marry Guan, PT 04/30/23, 1:52 PM  [facility] [address]  [address] [place], [place], [address] Phone: [phone]   Fax:  [phone]

## 2023-05-04 ENCOUNTER — Encounter: Payer: Self-pay | Admitting: Physical Therapy

## 2023-05-04 ENCOUNTER — Ambulatory Visit: Payer: Medicare Other | Attending: Orthopedic Surgery | Admitting: Physical Therapy

## 2023-05-04 DIAGNOSIS — M25511 Pain in right shoulder: Secondary | ICD-10-CM | POA: Diagnosis not present

## 2023-05-04 DIAGNOSIS — R293 Abnormal posture: Secondary | ICD-10-CM | POA: Diagnosis not present

## 2023-05-04 DIAGNOSIS — M25611 Stiffness of right shoulder, not elsewhere classified: Secondary | ICD-10-CM | POA: Diagnosis not present

## 2023-05-04 DIAGNOSIS — M6281 Muscle weakness (generalized): Secondary | ICD-10-CM | POA: Diagnosis not present

## 2023-05-04 NOTE — Therapy (Signed)
OUTPATIENT PHYSICAL THERAPY TREATMENT    Patient Name: [name] MRN: [id number] DOB:04-Nov-1945, 77 y.o., male Today's Date: 05/04/2023   END OF SESSION:  PT End of Session - 05/04/23 0930     Visit Number 11    Date for PT Re-Evaluation 05/25/23    Authorization Type Medicare & Tricare    Progress Note Due on Visit 20    PT Start Time 0930    PT Stop Time 1012    PT Time Calculation (min) 42 min    Activity Tolerance Patient tolerated treatment well    Behavior During Therapy [facility] for tasks assessed/performed                    Past Medical History:  Diagnosis Date   Abnormal CT scan, gastrointestinal tract suspect angioedema    Anemia 03/16/2016   IRON   Anxiety    Arthritis    Back pain 10/10/2012   Fatty liver 2005   seen on 2005 ultrasound,05/2015 CT.    Hemorrhoids 08/22/2016   Hemorrhoids, internal, with bleeding 2005   internal and external. 2005 band ligation (dr Noe Gens in [facility])   Hyperglycemia 08/21/2013   Hyperlipidemia    Hypertension    Nocturia 08/21/2013   Obesity (BMI 30.0-34.9)    Osteoporosis    Pneumonia    as a child   Pre-diabetes    Rectal bleeding 07/30/2014   Renal cyst    Left Kidney   SBO (small bowel obstruction) (HCC)    Tubular adenoma of colon before 2005, 2012, 08/2014   Unspecified constipation 02/06/2014   Vitamin D deficiency    Past Surgical History:  Procedure Laterality Date   BICEPT TENODESIS Right 03/12/2023   Procedure: BICEPS TENODESIS;  Surgeon: Cammy Copa, MD;  Location: [facility] OR;  Service: Orthopedics;  Laterality: Right;   CATARACT EXTRACTION W/ INTRAOCULAR LENS IMPLANT Bilateral    COLONOSCOPY  before 2005, 2012, 2013, 08/2014.    ENTEROSCOPY N/A 10/05/2017   Procedure: ENTEROSCOPY;  Surgeon: Iva Boop, MD;  Location: [facility];  Service: Endoscopy;  Laterality: N/A;   FRACTURE SURGERY Left 1989   leg   HEMORRHOID BANDING  2005   IR RADIOLOGIST EVAL & MGMT  11/11/2021   KNEE  SURGERY Left 2010   arthroscopy, torn carilage   KNEE SURGERY Right 2012   arthroscopy   REVERSE SHOULDER ARTHROPLASTY Right 03/12/2023   Procedure: REVERSE SHOULDER ARTHROPLASTY;  Surgeon: Cammy Copa, MD;  Location: [facility] OR;  Service: Orthopedics;  Laterality: Right;   TONSILLECTOMY  1957   TOTAL KNEE ARTHROPLASTY Right 07/24/2022   Procedure: RIGHT TOTAL KNEE ARTHROPLASTY;  Surgeon: Cammy Copa, MD;  Location: [facility] OR;  Service: Orthopedics;  Laterality: Right;   TOTAL SHOULDER ARTHROPLASTY Left 11/01/2020   Procedure: LEFT ANATOMIC SHOULDER REPLACEMENT;  Surgeon: Cammy Copa, MD;  Location: [facility] OR;  Service: Orthopedics;  Laterality: Left;   VENTRAL HERNIA REPAIR N/A 06/03/2018   Procedure: LAPAROSCOPIC VENTRAL WALL  HERNIA  REPAIR WITH MESH  ERAS PATHWAY;  Surgeon: Karie Soda, MD;  Location: [facility] ORS;  Service: General;  Laterality: N/A;   Patient Active Problem List   Diagnosis Date Noted   Primary osteoarthritis of right shoulder 03/14/2023   Biceps tendonitis on right 03/14/2023   Shoulder fracture 03/12/2023   S/P reverse total shoulder arthroplasty, right 03/12/2023   Arthritis of right knee 07/27/2022   S/P total knee arthroplasty, right 07/24/2022   Shoulder arthritis  S/P shoulder replacement, 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   Skin tags, multiple acquired 07/07/2019   LUQ pain 07/07/2019   OA (osteoarthritis) of knee 03/16/2019   Ventral hernia s/p lap repair with mesh 06/03/2018 06/03/2018   Hemorrhoids 08/22/2016   Anemia 03/16/2016   Right shoulder pain 02/26/2016   Abnormal CT scan, gastrointestinal tract suspect angioedema    Abdominal pain    Hyperglycemia 08/21/2013   Low back pain 10/10/2012   Hyperlipidemia    Hypertension, essential     Obesity (BMI 30.0-34.9)     PCP: Bradd Canary, MD   REFERRING PROVIDER: Cammy Copa, MD   REFERRING DIAG:  [phone] (ICD-10-CM) - S/P reverse total shoulder arthroplasty, right  M19.011 (ICD-10-CM) -  Primary osteoarthritis of right shoulder  M75.21 (ICD-10-CM) - Biceps tendonitis on right   THERAPY DIAG:  Stiffness of right shoulder, not elsewhere classified  Acute pain of right shoulder  Abnormal posture  Muscle weakness (generalized)  RATIONALE FOR EVALUATION AND TREATMENT: Rehabilitation  ONSET DATE: 03/12/23 - R reverse TSA + biceps tenodesis  NEXT MD VISIT: 06/05/23   SUBJECTIVE:                                                                                                                                                                                                         SUBJECTIVE STATEMENT: Pt reports more tightness than pain today.  EVAL: Pt reports the PA released him from the sling as of his postop follow-up on Friday 03/27/23. He states he had the CPM at home 3x/day x 1.5 hrs for the first 2 weeks post, but now discontinued. Biggest concerns currently with opening jars and feeding himself with R hand. Still sleeping in the recliner as the bed is too uncomfortable. He has been completing distal UE HEP exercises 3x/day.  PAIN: Are you having pain? Yes: NPRS scale: 2-3/10 Pain location: R anterior shoulder  Pain description: sore, tightness Aggravating factors: raising his arm too high, working with the mouse on the computer Relieving factors: oxycodone, gabapentin and muscle relaxant, ice  PERTINENT HISTORY:  R TKA 07/23/22, L TSA 11/01/20, anxiety, arthritis, back pain, osteoporosis, prediabetes, ventral hernia repair  PRECAUTIONS: Shoulder  RED FLAGS: None  HAND DOMINANCE: Right  WEIGHT BEARING RESTRICTIONS: Yes NWB R UE  FALLS:  Has patient fallen in last 6 months? No  LIVING ENVIRONMENT: Lives with: lives with their spouse Lives in: House/apartment Stairs: Yes: Internal: 12 steps; on right going up and External: 2 steps; none Has following equipment at home: Single point cane and Walker - 2 wheeled  OCCUPATION: Retired  PLOF: Independent and  Leisure: Golf, yard work, gym - bike    PATIENT GOALS: "Better ROM and less pain."   OBJECTIVE: (objective measures completed at initial evaluation unless otherwise dated)  DIAGNOSTIC FINDINGS:  03/12/23 - DG Right shoulder - IMPRESSION: Interval reverse right shoulder arthroplasty without evidence ofhardware failure or loosening.  PATIENT SURVEYS:  Quick Dash 70.5 / 100 = 70.5 %  COGNITION: Overall cognitive status: Within functional limits for tasks assessed     SENSATION: WFL Pt reports intermittent gnawing "toothache" feeling in his L thumb, tingling has subsided  POSTURE: rounded shoulders and forward head  UPPER EXTREMITY ROM:   Passive ROM Right eval R 04/10/23 R 04/23/23  Shoulder flexion 100 120 120  Shoulder extension     Shoulder abduction 88 108 110  Shoulder adduction     Shoulder internal rotation     Shoulder external rotation 15 23 23   (Blank rows = not tested)  AROM assessment deferred on eval due to patient only 2 weeks postop Active ROM Right  supine 04/30/23  Shoulder flexion 140  Shoulder extension   Shoulder abduction 110  Shoulder adduction   Shoulder internal rotation   Shoulder external rotation 15  Elbow flexion   Elbow extension   Wrist flexion   Wrist extension   Wrist ulnar deviation   Wrist radial deviation   Wrist pronation   Wrist supination   (Blank rows = not tested)  UPPER EXTREMITY MMT: (Deferred on eval due to only 2 weeks postop)  MMT Right eval Left eval  Shoulder flexion    Shoulder extension    Shoulder abduction    Shoulder adduction    Shoulder internal rotation    Shoulder external rotation    Middle trapezius    Lower trapezius    Elbow flexion    Elbow extension    Wrist flexion    Wrist extension    Wrist ulnar deviation    Wrist radial deviation    Wrist pronation    Wrist supination    Grip strength (lbs)    (Blank rows = not tested)  PALPATION:  Mild TTP R pecs, biceps, teres  group    TODAY'S TREATMENT:   05/04/23 THERAPEUTIC EXERCISE: to improve flexibility, strength and mobility.  Demonstration, verbal and tactile cues throughout for technique. Pulleys - 3' each flexion and scaption in pain free ROM Supine R shoulder protraction 2 x 10, 2nd set with 1# db Supine R shoulder CW/CCW circles at 90 flexion  2 x 10, 2nd set with 1# db Supine R shoulder "uppercut" flexion AROM x 10 Supine R shoulder "hitchhiker" scaption AROM x 10 S/L R scap retraction + shoulder ER x 15 S/L R shoulder scaption/ABD x 10  MANUAL THERAPY: To promote normalized muscle tension, improved flexibility, improved joint mobility, increased ROM, and reduced pain. R shoulder flexion, scaption and ER PROM within limits of protocol and pain tolerance  STM/DTM and manual TPR to R biceps, teres group, lats, subscapularis, LS & UT Supine R shoulder alternating IR/ER isometrics with arm in ~45 scaption 2 x 30" Supine R shoulder rhythmic stabilization at 90 flexion 2 x 30"   04/30/23 THERAPEUTIC EXERCISE: to improve flexibility, strength and mobility.  Demonstration, verbal and tactile cues throughout for technique. Pulleys - 3' each flexion and scaption in pain free ROM Supine R shoulder flexion AAROM with wand to tolerance 2 x 10 Supine R shoulder scaption AAROM with wand to tolerance 2 x 10 Supine  R shoulder ER AAROM with wand to tolerance (towel roll under arm) 2 x 10  MANUAL THERAPY: To promote normalized muscle tension, improved flexibility, improved joint mobility, increased ROM, and reduced pain. R shoulder flexion, scaption and ER PROM to tolerance  Vaso to R shoulder 34 deg low compression x 10 min   04/27/23 THERAPEUTIC EXERCISE: to improve flexibility, strength and mobility.  Demonstration, verbal and tactile cues throughout for technique. Pulleys - 3' each flexion and scaption in pain free ROM Supine R shoulder flexion AAROM with wand to tolerance 2 x 10 Supine R shoulder  scaption AAROM with wand to tolerance 2 x 10 Supine R shoulder ER AAROM with wand to tolerance (towel roll under arm) 2 x 10 Seated self-resisted R shoulder IR & ER submax (30-50% effort) isometrics 10 x 5" each Standing shoulder YTB scap retraction + shoulder row 10 x 5" Standing shoulder YTB scap retraction + shoulder extension 10 x 5"  MANUAL THERAPY: To promote normalized muscle tension, improved flexibility, improved joint mobility, increased ROM, and reduced pain. R shoulder flexion, scaption and ER PROM within limits of protocol and pain tolerance  STM/DTM and manual TPR to R pecs Incisional scar massage to full incision   PATIENT EDUCATION:  Education details: HEP progression - shoulder wand AAROM, shoulder IR/ER isometrics   Person educated: Patient Education method: Programmer, multimedia, Demonstration, Verbal cues, and Handouts Education comprehension: verbalized understanding, returned demonstration, verbal cues required, and needs further education  HOME EXERCISE PROGRAM: Access Code: [email or web address] URL: [email or web address] Date: 04/27/2023 Prepared by: Glenetta Hew  Exercises - Seated Shoulder Flexion Towel Slide at Table Top  - 2-3 x daily - 7 x weekly - 2 sets - 10 reps - 3 sec  hold - Seated Shoulder Scaption Slide at Table Top with Forearm in Neutral  - 2-3 x daily - 7 x weekly - 2 sets - 10 reps - 3 sec hold - Seated Shoulder Setting  - 2-3 x daily - 7 x weekly - 2 sets - 10 reps - 3 sec hold - Isometric Shoulder Flexion at Wall (Mirrored)  - 1-2 x daily - 7 x weekly - 2 sets - 5 reps - 5 sec hold - Isometric Shoulder Abduction at Wall (Mirrored)  - 1-2 x daily - 7 x weekly - 2 sets - 5 reps - 5 sec hold - Isometric Shoulder Extension at Wall (Mirrored)  - 1-2 x daily - 7 x weekly - 2 sets - 5 reps - 5 sec hold - Gentle Vertical Shoulder Pendulum  - 2-3 x daily - 7 x weekly - 2 sets - 10 reps - Gentle Horizontal Shoulder Pendulum  - 2-3 x daily - 7 x weekly - 2 sets  - 10 reps - Circular Shoulder Pendulum with Table Support  - 2-3 x daily - 7 x weekly - 2 sets - 10 reps - Supine Shoulder Flexion AAROM with Dowel  - 1-2 x daily - 7 x weekly - 2 sets - 10 reps - 3 sec hold - Supine Shoulder Scaption with Dowel  - 1-2 x daily - 7 x weekly - 2 sets - 10 reps - 3 sec hold - Supine Shoulder External Rotation with Dowel at 20-30 Degrees of Abduction  - 1-2 x daily - 7 x weekly - 2 sets - 10 reps - 3 sec hold - Isometric Shoulder Internal Rotation (Mirrored)  - 1 x daily - 7 x weekly - 2 sets - 5 reps - 5 sec hold -  Isometric Shoulder External Rotation (Mirrored)  - 1 x daily - 7 x weekly - 2 sets - 5 reps - 5 sec hold   ASSESSMENT:  CLINICAL IMPRESSION: [name] reports more restriction due to tightness than pain at present.  Considerable tightness noted in R biceps and posterior shoulder complex which was addressed with MT and manual stretching with patient noting improved feeling of flexibility and ease of ROM.  Introduced rhythmic stabilization and alternating isometrics as well as scapular stabilization in supine with good tolerance.  Continued to caution patient to avoid overdoing activity with R UE as he required multiple reminders during therapy session to avoid explosive or sudden motions.  [place] is progressing well per his rehab protocol and goals and will benefit from continued skilled PT to address ongoing deficits to improve mobility and activity tolerance with decreased pain interference.   OBJECTIVE IMPAIRMENTS: decreased activity tolerance, decreased coordination, decreased knowledge of condition, decreased ROM, decreased strength, increased fascial restrictions, impaired perceived functional ability, increased muscle spasms, impaired flexibility, impaired sensation, impaired UE functional use, postural dysfunction, and pain.   ACTIVITY LIMITATIONS: carrying, lifting, sleeping, bathing, dressing, reach over head, hygiene/grooming, and caring for  others  PARTICIPATION LIMITATIONS: meal prep, cleaning, laundry, driving, shopping, community activity, and yard work  PERSONAL FACTORS: Past/current experiences, Time since onset of injury/illness/exacerbation, and 3+ comorbidities: R TKA 07/23/22, L TSA 11/01/20, anxiety, arthritis, back pain, osteoporosis, prediabetes, ventral hernia repair  are also affecting patient's functional outcome.   REHAB POTENTIAL: Good  CLINICAL DECISION MAKING: Stable/uncomplicated  EVALUATION COMPLEXITY: Low   GOALS: Goals reviewed with patient? Yes  SHORT TERM GOALS: Target date: 04/27/2023  Patient will be independent with initial HEP to improve outcomes and carryover.  Baseline:  Goal status: MET - 04/10/23  2.  Patient will report 25% improvement in R shoulder pain to improve tolerance for exercise and activity.  Baseline: 6/10 Goal status: MET - 04/10/23 - Pain 3-4/10  LONG TERM GOALS: Target date: 05/25/2023  Patient will be independent with ongoing/advanced HEP for self-management at home.  Baseline:  Goal status: IN PROGRESS - 04/27/23 - met for current HEP, updated today  2.  Patient will report 75% improvement in R shoulder pain to improve QOL.  Baseline: 6/10 Goal status: IN PROGRESS - 04/30/23 - 70%   3.  Patient to demonstrate improved upright posture with posterior shoulder girdle engaged to promote improved glenohumeral joint mobility. Baseline: Forward head and rounded shoulder posture Goal status: IN PROGRESS  4.  Patient to improve R shoulder AROM to [facility] without pain provocation to allow for increased ease of ADLs.  Baseline: Refer to above UE ROM table Goal status: IN PROGRESS - 04/30/23 - see chart  5.  Patient will demonstrate improved R shoulder strength to >/= 4 to 4+/5 for functional UE use. Baseline: Refer to above UE MMT table Goal status: IN PROGRESS - 04/30/23 - non testing now  6  Patient will report </= 60% on QuickDASH to demonstrate improved functional  ability (MDIC = 10).  Baseline: 70.5 / 100 = 70.5 % Goal status: IN PROGRESS  7.  Patient to report ability to perform ADLs, household, and leisure-related tasks without limitation due to R shoulder pain, LOM or weakness.   Baseline: Limited with all activities requiring use of R arm Goal status: IN PROGRESS - 04/30/23 - pt now able to shave, drink out of a cup   PLAN:  PT FREQUENCY: 2x/week  PT DURATION: 8 weeks  PLANNED INTERVENTIONS: Therapeutic exercises,  Therapeutic activity, Neuromuscular re-education, Patient/Family education, Self Care, Joint mobilization, Dry Needling, Electrical stimulation, Cryotherapy, Moist heat, Taping, Vasopneumatic device, Ultrasound, Ionotophoresis 4mg /ml Dexamethasone, Manual therapy, and Re-evaluation  PLAN FOR NEXT SESSION: Per reverse TSA protocol (Sx 03/12/23) - post-op week #8 as of 05/07/23 - continue with PROM & gentle AA/AROM - gradually increasing forward flexion and elevation in the scapular plane in supine, progressing to sitting/standing; periscapular deltoid submaximal pain-free isometrics in scapular plane and gentle GH IR & ER sub-max pain free isometrics; resisted exercises of elbow, wrist and hand   Marry Guan, PT 05/04/2023, 5:18 PM

## 2023-05-05 ENCOUNTER — Ambulatory Visit: Payer: Medicare Other

## 2023-05-05 VITALS — Ht 70.5 in | Wt 199.0 lb

## 2023-05-05 DIAGNOSIS — Z0000 Encounter for general adult medical examination without abnormal findings: Secondary | ICD-10-CM | POA: Diagnosis not present

## 2023-05-05 NOTE — Patient Instructions (Addendum)
[name] , Thank you for taking time to come for your Medicare Wellness Visit. I appreciate your ongoing commitment to your health goals. Please review the following plan we discussed and let me know if I can assist you in the future.   Referrals/Orders/Follow-Ups/Clinician Recommendations:   This is a list of the screening recommended for you and due dates:  Health Maintenance  Topic Date Due   COVID-19 Vaccine (7 - 2023-24 season) 03/01/2023   Medicare Annual Wellness Visit  05/04/2024   DTaP/Tdap/Td vaccine (3 - Td or Tdap) 12/05/2030   Pneumonia Vaccine  Completed   Flu Shot  Completed   Hepatitis C Screening  Completed   Zoster (Shingles) Vaccine  Completed   HPV Vaccine  Aged Out   Colon Cancer Screening  Discontinued  Opioid Pain Medicine Management Opioids are powerful medicines that are used to treat moderate to severe pain. When used for short periods of time, they can help you to: Sleep better. Do better in physical or occupational therapy. Feel better in the first few days after an injury. Recover from surgery. Opioids should be taken with the supervision of a trained health care provider. They should be taken for the shortest period of time possible. This is because opioids can be addictive, and the longer you take opioids, the greater your risk of addiction. This addiction can also be called opioid use disorder. What are the risks? Using opioid pain medicines for longer than 3 days increases your risk of side effects. Side effects include: Constipation. Nausea and vomiting. Breathing difficulties (respiratory depression). Drowsiness. Confusion. Opioid use disorder. Itching. Taking opioid pain medicine for a long period of time can affect your ability to do daily tasks. It also puts you at risk for: Motor vehicle crashes. Depression. Suicide. Heart attack. Overdose, which can be life-threatening. What is a pain treatment plan? A pain treatment plan is an  agreement between you and your health care provider. Pain is unique to each person, and treatments vary depending on your condition. To manage your pain, you and your health care provider need to work together. To help you do this: Discuss the goals of your treatment, including how much pain you might expect to have and how you will manage the pain. Review the risks and benefits of taking opioid medicines. Remember that a good treatment plan uses more than one approach and minimizes the chance of side effects. Be honest about the amount of medicines you take and about any drug or alcohol use. Get pain medicine prescriptions from only one health care provider. Pain can be managed with many types of alternative treatments. Ask your health care provider to refer you to one or more specialists who can help you manage pain through: Physical or occupational therapy. Counseling (cognitive behavioral therapy). Good nutrition. Biofeedback. Massage. Meditation. Non-opioid medicine. Following a gentle exercise program. How to use opioid pain medicine Taking medicine Take your pain medicine exactly as told by your health care provider. Take it only when you need it. If your pain gets less severe, you may take less than your prescribed dose if your health care provider approves. If you are not having pain, do nottake pain medicine unless your health care provider tells you to take it. If your pain is severe, do nottry to treat it yourself by taking more pills than instructed on your prescription. Contact your health care provider for help. Write down the times when you take your pain medicine. It is easy to become  confused while on pain medicine. Writing the time can help you avoid overdose. Take other over-the-counter or prescription medicines only as told by your health care provider. Keeping yourself and others safe  While you are taking opioid pain medicine: Do not drive, use machinery, or power  tools. Do not sign legal documents. Do not drink alcohol. Do not take sleeping pills. Do not supervise children by yourself. Do not do activities that require climbing or being in high places. Do not go to a lake, river, ocean, spa, or swimming pool. Do not share your pain medicine with anyone. Keep pain medicine in a locked cabinet or in a secure area where pets and children cannot reach it. Stopping your use of opioids If you have been taking opioid medicine for more than a few weeks, you may need to slowly decrease (taper) how much you take until you stop completely. Tapering your use of opioids can decrease your risk of symptoms of withdrawal, such as: Pain and cramping in the abdomen. Nausea. Sweating. Sleepiness. Restlessness. Uncontrollable shaking (tremors). Cravings for the medicine. Do not attempt to taper your use of opioids on your own. Talk with your health care provider about how to do this. Your health care provider may prescribe a step-down schedule based on how much medicine you are taking and how long you have been taking it. Getting rid of leftover pills Do not save any leftover pills. Get rid of leftover pills safely by: Taking the medicine to a prescription take-back program. This is usually offered by the county or law enforcement. Bringing them to a pharmacy that has a drug disposal container. Flushing them down the toilet. Check the label or package insert of your medicine to see whether this is safe to do. Throwing them out in the trash. Check the label or package insert of your medicine to see whether this is safe to do. If it is safe to throw it out, remove the medicine from the original container, put it into a sealable bag or container, and mix it with used coffee grounds, food scraps, dirt, or cat litter before putting it in the trash. Follow these instructions at home: Activity Do exercises as told by your health care provider. Avoid activities that make  your pain worse. Return to your normal activities as told by your health care provider. Ask your health care provider what activities are safe for you. General instructions You may need to take these actions to prevent or treat constipation: Drink enough fluid to keep your urine pale yellow. Take over-the-counter or prescription medicines. Eat foods that are high in fiber, such as beans, whole grains, and fresh fruits and vegetables. Limit foods that are high in fat and processed sugars, such as fried or sweet foods. Keep all follow-up visits. This is important. Where to find support If you have been taking opioids for a long time, you may benefit from receiving support for quitting from a local support group or counselor. Ask your health care provider for a referral to these resources in your area. Where to find more information Centers for Disease Control and Prevention (CDC): FootballExhibition.com.br [place] Food and Drug Administration (FDA): PumpkinSearch.com.ee Get help right away if: You may have taken too much of an opioid (overdosed). Common symptoms of an overdose: Your breathing is slower or more shallow than normal. You have a very slow heartbeat (pulse). You have slurred speech. You have nausea and vomiting. Your pupils become very small. You have other potential symptoms: You  are very confused. You faint or feel like you will faint. You have cold, clammy skin. You have blue lips or fingernails. You have thoughts of harming yourself or harming others. These symptoms may represent a serious problem that is an emergency. Do not wait to see if the symptoms will go away. Get medical help right away. Call your local emergency services (911 in the [place] Do not drive yourself to the hospital.  If you ever feel like you may hurt yourself or others, or have thoughts about taking your own life, get help right away. Go to your nearest emergency department or: Call your local emergency services (911 in the  [place] Call the [facility] ((865)315-4731 in the [place] Call a suicide crisis helpline, such as the National Suicide Prevention Lifeline at (365) 201-3465 or 988 in the [place] This is open 24 hours a day in the [place] Text the Crisis Text Line at 856-629-4294 (in the [place] Summary Opioid medicines can help you manage moderate to severe pain for a short period of time. A pain treatment plan is an agreement between you and your health care provider. Discuss the goals of your treatment, including how much pain you might expect to have and how you will manage the pain. If you think that you or someone else may have taken too much of an opioid, get medical help right away. This information is not intended to replace advice given to you by your health care provider. Make sure you discuss any questions you have with your health care provider. Document Revised: 01/09/2021 Document Reviewed: 09/26/2020 [employer] Patient Education  2024 [employer]   Advanced directives: (Copy Requested) Please bring a copy of your health care power of attorney and living will to the office to be added to your chart at your convenience.  Next Medicare Annual Wellness Visit scheduled for next year: Yes

## 2023-05-05 NOTE — Progress Notes (Signed)
Subjective:   [name] is a 77 y.o. male who presents for Medicare Annual/Subsequent preventive examination.  Visit Complete: Virtual I connected with  [name] on 05/05/23 by a audio enabled telemedicine application and verified that I am speaking with the correct person using two identifiers.  Patient Location: Home  Provider Location: Home Office  I discussed the limitations of evaluation and management by telemedicine. The patient expressed understanding and agreed to proceed.  Vital Signs: Because this visit was a virtual/telehealth visit, some criteria may be missing or patient reported. Any vitals not documented were not able to be obtained and vitals that have been documented are patient reported.    Cardiac Risk Factors include: advanced age (>82men, >4 women);male gender;hypertension     Objective:    Today's Vitals   05/05/23 1142  Weight: 199 lb (90.3 kg)  Height: 5' 10.5" (1.791 m)   Body mass index is 28.15 kg/m.     05/05/2023   11:49 AM 03/30/2023    2:50 PM 03/12/2023    2:12 PM 03/12/2023    7:07 AM 03/03/2023    9:34 AM 08/12/2022    3:39 PM 07/24/2022    1:47 PM  Advanced Directives  Does Patient Have a Medical Advance Directive? Yes Yes Yes Yes Yes Yes   Type of Estate agent of [place];Living will Healthcare Power of [place];Living will Healthcare Power of [employer] of [place];Living will Healthcare Power of [place];Living will Healthcare Power of [employer] Power of Attorney  Does patient want to make changes to medical advance directive?   Yes (Inpatient - patient defers changing a medical advance directive at this time - Information given) No - Patient declined  No - Patient declined   Copy of Healthcare Power of Attorney in Chart? No - copy requested No - copy requested No - copy requested No - copy requested No - copy requested No - copy requested No - copy requested    Current Medications  (verified) Outpatient Encounter Medications as of 05/05/2023  Medication Sig   acetaminophen (TYLENOL) 325 MG tablet Take 1-2 tablets (325-650 mg total) by mouth every 6 (six) hours as needed for mild pain (pain score 1-3 or temp > 100.5).   ALPRAZolam (XANAX) 1 MG tablet TAKE 1 TABLET (1 MG TOTAL) BY MOUTH AT BEDTIME AS NEEDED FOR ANXIETY.   amLODipine (NORVASC) 10 MG tablet TAKE 1 TABLET DAILY   aspirin 81 MG chewable tablet Chew 1 tablet (81 mg total) by mouth 2 (two) times daily.   gabapentin (NEURONTIN) 100 MG capsule Take 1 capsule (100 mg total) by mouth 2 (two) times daily.   Iron, Ferrous Sulfate, 325 (65 Fe) MG TABS Take 325 mg by mouth daily.   losartan (COZAAR) 50 MG tablet TAKE 1 TABLET DAILY (NEED FURTHER EVALUATION AND/OR LABORATORY TESTING BEFORE FURTHER REFILLS GIVEN, MAKE APPOINTMENT FOR THIS) (Patient taking differently: Take 50 mg by mouth daily.)   methocarbamol (ROBAXIN) 500 MG tablet Take 1 tablet (500 mg total) by mouth every 8 (eight) hours as needed for muscle spasms.   metoprolol succinate (TOPROL-XL) 50 MG 24 hr tablet TAKE AS INSTRUCTED BY YOUR PRESCRIBER (Patient taking differently: Take 50 mg by mouth daily.)   Multiple Vitamins-Minerals (MULTIVITAMIN ADULT PO) Take 2 tablets by mouth daily.   oxyCODONE (OXY IR/ROXICODONE) 5 MG immediate release tablet Take 1 tablet (5 mg total) by mouth every 12 (twelve) hours as needed for moderate pain (pain score 4-6) (pain score 4-6).  rosuvastatin (CRESTOR) 20 MG tablet Take 1 tablet (20 mg total) by mouth daily.   tamsulosin (FLOMAX) 0.4 MG CAPS capsule Take 0.4 mg by mouth daily.   triamcinolone cream (KENALOG) 0.1 % Apply 1 Application topically 2 (two) times daily.   No facility-administered encounter medications on file as of 05/05/2023.    Allergies (verified) Patient has no known allergies.   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    Hemorrhoids 08/22/2016   Hemorrhoids, internal, with bleeding 2005   internal and external. 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   Procedure: RIGHT TOTAL KNEE ARTHROPLASTY;  Surgeon: Cammy Copa, MD;  Location: [facility] OR;  Service: Orthopedics;  Laterality: Right;   TOTAL SHOULDER ARTHROPLASTY Left 11/01/2020   Procedure: LEFT ANATOMIC SHOULDER REPLACEMENT;  Surgeon: Cammy Copa, MD;  Location: [facility] OR;  Service: Orthopedics;  Laterality: Left;    VENTRAL HERNIA REPAIR N/A 06/03/2018   Procedure: LAPAROSCOPIC VENTRAL WALL  HERNIA  REPAIR WITH MESH  ERAS PATHWAY;  Surgeon: Karie Soda, MD;  Location: [facility] ORS;  Service: General;  Laterality: N/A;   Family History  Problem Relation Age of Onset   Stroke Mother    Aneurysm Mother        brain   Arthritis Father    Liver cancer Father        liver, atomic testing in military   Gallbladder disease Father    Hyperlipidemia Sister    Hypertension Sister    Obesity Sister    Diabetes Sister    Diabetes Brother    Hyperlipidemia Sister    Hypertension Sister    Obesity Sister    Diabetes Sister    Aneurysm Brother        brain   COPD Brother    Gallbladder  disease Sister        x4   Colon cancer Neg Hx    Colon polyps Neg Hx    Esophageal cancer Neg Hx    Rectal cancer Neg Hx    Stomach cancer Neg Hx    Prostate cancer Neg Hx    CAD Neg Hx    Social History   Socioeconomic History   Marital status: Married    Spouse name: Not on file   Number of children: 1   Years of education: Not on file   Highest education level: Not on file  Occupational History   Occupation: Retired    Associate Professor: [employer]  Tobacco Use   Smoking status: Never   Smokeless tobacco: Never  Vaping Use   Vaping status: Never Used  Substance and Sexual Activity   Alcohol use: Not Currently    Comment: Occassionally   Drug use: No   Sexual activity: Yes  Other Topics Concern   Not on file  Social History Narrative   Married - 1 child   Retired from [employer]   Some EtOH, no tbacco/drug use   Social Determinants of Health   Financial Resource Strain: Low Risk  (05/05/2023)   Overall Financial Resource Strain (CARDIA)    Difficulty of Paying Living Expenses: Not hard at all  Food Insecurity: No Food Insecurity (05/05/2023)   Hunger Vital Sign    Worried About Running Out of Food in the Last Year: Never true    Ran Out of Food in the Last Year: Never true  Transportation Needs: No  Transportation Needs (05/05/2023)   PRAPARE - Administrator, Civil Service (Medical): No    Lack of Transportation (Non-Medical): No  Physical Activity: Sufficiently Active (05/05/2023)   Exercise Vital Sign    Days of Exercise per Week: 3 days    Minutes of Exercise per Session: 120 min  Stress: No Stress Concern Present (05/05/2023)   [employer] of Occupational Health - Occupational Stress Questionnaire    Feeling of Stress : Not at all  Social Connections: Socially Integrated (05/05/2023)   Social Connection and Isolation Panel [NHANES]    Frequency of Communication with Friends and Family: More than three times a week    Frequency of Social Gatherings with Friends and Family: More than three times a week    Attends Religious Services: More than 4 times per year    Active Member of [employer] or Organizations: Yes    Attends Engineer, structural: More than 4 times per year    Marital Status: Married    Tobacco Counseling Counseling given: Not Answered   Clinical Intake:  Pre-visit preparation completed: Yes  Pain : No/denies pain     BMI - recorded: 28.15 Nutritional Status: BMI 25 -29 Overweight Nutritional Risks: None Diabetes: No  How often do you need to have someone help you when you read instructions, pamphlets, or other written materials from your doctor or pharmacy?: 1 - Never  Interpreter Needed?: No  Information entered by :: Theresa Mulligan LPN   Activities of Daily Living    05/05/2023   11:48 AM 03/12/2023    2:36 PM  In your present state of health, do you have any difficulty performing the following activities:  Hearing? 0   Vision? 0   Difficulty concentrating or making decisions? 0   Walking or climbing stairs? 0   Dressing or bathing? 0   Doing errands, shopping? 0 0  Preparing Food  and eating ? N   Using the Toilet? N   In the past six months, have you accidently leaked urine? N   Do you have problems with loss of bowel  control? N   Managing your Medications? N   Managing your Finances? N   Housekeeping or managing your Housekeeping? N     Patient Care Team: Bradd Canary, MD as PCP - General (Family Medicine) Karie Soda, MD as Consulting Physician (General Surgery) Venita Lick, MD as Consulting Physician (Orthopedic Surgery) Iva Boop, MD as Consulting Physician (Gastroenterology)  Indicate any recent Medical Services you may have received from other than [facility] providers in the past year (date may be approximate).     Assessment:   This is a routine wellness examination for [name].  Hearing/Vision screen Hearing Screening - Comments:: Denies hearing difficulties   Vision Screening - Comments:: Wears rx glasses - up to date with routine eye exams with  Dr Martha Clan   Goals Addressed               This Visit's Progress     Patient Stated (pt-stated)        Maintain current healthy lifestyle!       Depression Screen    05/05/2023   11:48 AM 11/11/2022   11:14 AM 04/24/2022    9:41 AM 05/06/2021    8:02 AM 04/22/2021    9:50 AM 10/26/2020    1:01 PM 04/26/2019    1:09 PM  PHQ 2/9 Scores  PHQ - 2 Score 0 0 0 0 0 0 0  PHQ- 9 Score  0         Fall Risk    05/05/2023   11:49 AM 11/11/2022   11:13 AM 04/24/2022    9:40 AM 05/06/2021    8:01 AM 04/22/2021    9:49 AM  Fall Risk   Falls in the past year? 0 0 0 0 0  Number falls in past yr: 0 0 0 0 0  Injury with Fall? 0 0 0 0 0  Risk for fall due to : No Fall Risks  No Fall Risks    Follow up Falls prevention discussed Falls evaluation completed Falls evaluation completed  Falls prevention discussed    MEDICARE RISK AT HOME: Medicare Risk at Home Any stairs in or around the home?: Yes If so, are there any without handrails?: No Home free of loose throw rugs in walkways, pet beds, electrical cords, etc?: Yes Adequate lighting in your home to reduce risk of falls?: Yes Life alert?: No Use of a cane, walker or w/c?:  No Grab bars in the bathroom?: No Shower chair or bench in shower?: No Elevated toilet seat or a handicapped toilet?: No  TIMED UP AND GO:  Was the test performed?  No    Cognitive Function:    08/22/2016    8:24 AM  MMSE - Mini Mental State Exam  Orientation to time 5  Orientation to Place 5  Registration 3  Attention/ Calculation 5  Recall 3  Language- name 2 objects 2  Language- repeat 1  Language- follow 3 step command 3  Language- read & follow direction 1  Write a sentence 1  Copy design 1  Total score 30        05/05/2023   11:49 AM 04/24/2022    9:48 AM  6CIT Screen  What Year? 0 points 0 points  What month? 0 points 0 points  What time? 0  points 0 points  Count back from 20 0 points 0 points  Months in reverse 0 points 0 points  Repeat phrase 0 points 0 points  Total Score 0 points 0 points    Immunizations Immunization History  Administered Date(s) Administered   Fluad Quad(high Dose 65+) 03/15/2019, 04/03/2021, 03/31/2022   Fluad Trivalent(High Dose 65+) 04/06/2023   Influenza Whole 04/12/2013   Influenza, High Dose Seasonal PF 03/11/2016, 04/07/2017, 04/20/2018   Influenza,inj,Quad PF,6+ Mos 04/12/2015   Influenza-Unspecified 04/13/2014   PFIZER Comirnaty(Gray Top)Covid-19 Tri-Sucrose Vaccine 12/14/2020   PFIZER(Purple Top)SARS-COV-2 Vaccination 07/22/2019, 08/12/2019, 03/27/2020, 04/14/2022   Pfizer Covid-19 Vaccine Bivalent Booster 69yrs & up 03/26/2021   Pfizer(Comirnaty)Fall Seasonal Vaccine 12 years and older 04/16/2022   Pneumococcal Conjugate-13 08/18/2013   Pneumococcal Polysaccharide-23 02/19/2016   Tdap 06/30/2010, 12/04/2020   Zoster Recombinant(Shingrix) 04/20/2018, 07/30/2018   Zoster, Live 06/30/2010    TDAP status: Up to date  Flu Vaccine status: Up to date  Pneumococcal vaccine status: Up to date  Covid-19 vaccine status: Declined, Education has been provided regarding the importance of this vaccine but patient still  declined. Advised may receive this vaccine at local pharmacy or Health Dept.or vaccine clinic. Aware to provide a copy of the vaccination record if obtained from local pharmacy or Health Dept. Verbalized acceptance and understanding.  Qualifies for Shingles Vaccine? Yes   Zostavax completed Yes   Shingrix Completed?: Yes  Screening Tests Health Maintenance  Topic Date Due   COVID-19 Vaccine (7 - 2023-24 season) 03/01/2023   Medicare Annual Wellness (AWV)  05/04/2024   DTaP/Tdap/Td (3 - Td or Tdap) 12/05/2030   Pneumonia Vaccine 72+ Years old  Completed   INFLUENZA VACCINE  Completed   Hepatitis C Screening  Completed   Zoster Vaccines- Shingrix  Completed   HPV VACCINES  Aged Out   Colonoscopy  Discontinued    Health Maintenance  Health Maintenance Due  Topic Date Due   COVID-19 Vaccine (7 - 2023-24 season) 03/01/2023        Additional Screening:  Hepatitis C Screening: does qualify; Completed 02/19/16  Vision Screening: Recommended annual ophthalmology exams for early detection of glaucoma and other disorders of the eye. Is the patient up to date with their annual eye exam?  Yes  Who is the provider or what is the name of the office in which the patient attends annual eye exams? Dr Martha Clan If pt is not established with a provider, would they like to be referred to a provider to establish care? No .   Dental Screening: Recommended annual dental exams for proper oral hygiene    Community Resource Referral / Chronic Care Management:  CRR required this visit?  No   CCM required this visit?  No     Plan:     I have personally reviewed and noted the following in the patient's chart:   Medical and social history Use of alcohol, tobacco or illicit drugs  Current medications and supplements including opioid prescriptions. Patient is currently taking opioid prescriptions. Information provided to patient regarding non-opioid alternatives. Patient advised to discuss  non-opioid treatment plan with their provider. Functional ability and status Nutritional status Physical activity Advanced directives List of other physicians Hospitalizations, surgeries, and ER visits in previous 12 months Vitals Screenings to include cognitive, depression, and falls Referrals and appointments  In addition, I have reviewed and discussed with patient certain preventive protocols, quality metrics, and best practice recommendations. A written personalized care plan for preventive services as well as general  preventive health recommendations were provided to patient.     Tillie Rung, LPN   [id number]/11/[id number]   After Visit Summary: (MyChart) Due to this being a telephonic visit, the after visit summary with patients personalized plan was offered to patient via MyChart   Nurse Notes: None

## 2023-05-12 ENCOUNTER — Ambulatory Visit: Payer: Medicare Other

## 2023-05-12 DIAGNOSIS — R293 Abnormal posture: Secondary | ICD-10-CM

## 2023-05-12 DIAGNOSIS — M25611 Stiffness of right shoulder, not elsewhere classified: Secondary | ICD-10-CM | POA: Diagnosis not present

## 2023-05-12 DIAGNOSIS — M25511 Pain in right shoulder: Secondary | ICD-10-CM

## 2023-05-12 DIAGNOSIS — M6281 Muscle weakness (generalized): Secondary | ICD-10-CM

## 2023-05-12 NOTE — Therapy (Signed)
OUTPATIENT PHYSICAL THERAPY TREATMENT    Patient Name: [name] MRN: [id number] DOB:16-Feb-1946, 77 y.o., male Today's Date: 05/12/2023   END OF SESSION:  PT End of Session - 05/12/23 1123     Visit Number 12    Date for PT Re-Evaluation 05/25/23    Authorization Type Medicare & Tricare    Progress Note Due on Visit 20    PT Start Time 1100    PT Stop Time 1145    PT Time Calculation (min) 45 min    Activity Tolerance Patient tolerated treatment well    Behavior During Therapy [facility] for tasks assessed/performed                     Past Medical History:  Diagnosis Date   Abnormal CT scan, gastrointestinal tract suspect angioedema    Anemia 03/16/2016   IRON   Anxiety    Arthritis    Back pain 10/10/2012   Fatty liver 2005   seen on 2005 ultrasound,05/2015 CT.    Hemorrhoids 08/22/2016   Hemorrhoids, internal, with bleeding 2005   internal and external. 2005 band ligation (dr Noe Gens in [facility])   Hyperglycemia 08/21/2013   Hyperlipidemia    Hypertension    Nocturia 08/21/2013   Obesity (BMI 30.0-34.9)    Osteoporosis    Pneumonia    as a child   Pre-diabetes    Rectal bleeding 07/30/2014   Renal cyst    Left Kidney   SBO (small bowel obstruction) (HCC)    Tubular adenoma of colon before 2005, 2012, 08/2014   Unspecified constipation 02/06/2014   Vitamin D deficiency    Past Surgical History:  Procedure Laterality Date   BICEPT TENODESIS Right 03/12/2023   Procedure: BICEPS TENODESIS;  Surgeon: Cammy Copa, MD;  Location: [facility] OR;  Service: Orthopedics;  Laterality: Right;   CATARACT EXTRACTION W/ INTRAOCULAR LENS IMPLANT Bilateral    COLONOSCOPY  before 2005, 2012, 2013, 08/2014.    ENTEROSCOPY N/A 10/05/2017   Procedure: ENTEROSCOPY;  Surgeon: Iva Boop, MD;  Location: [facility] ENDOSCOPY;  Service: Endoscopy;  Laterality: N/A;   FRACTURE SURGERY Left 1989   leg   HEMORRHOID BANDING  2005   IR RADIOLOGIST EVAL & MGMT  11/11/2021    KNEE SURGERY Left 2010   arthroscopy, torn carilage   KNEE SURGERY Right 2012   arthroscopy   REVERSE SHOULDER ARTHROPLASTY Right 03/12/2023   Procedure: REVERSE SHOULDER ARTHROPLASTY;  Surgeon: Cammy Copa, MD;  Location: [facility] OR;  Service: Orthopedics;  Laterality: Right;   TONSILLECTOMY  1957   TOTAL KNEE ARTHROPLASTY Right 07/24/2022   Procedure: RIGHT TOTAL KNEE ARTHROPLASTY;  Surgeon: Cammy Copa, MD;  Location: [facility] OR;  Service: Orthopedics;  Laterality: Right;   TOTAL SHOULDER ARTHROPLASTY Left 11/01/2020   Procedure: LEFT ANATOMIC SHOULDER REPLACEMENT;  Surgeon: Cammy Copa, MD;  Location: [facility] OR;  Service: Orthopedics;  Laterality: Left;   VENTRAL HERNIA REPAIR N/A 06/03/2018   Procedure: LAPAROSCOPIC VENTRAL WALL  HERNIA  REPAIR WITH MESH  ERAS PATHWAY;  Surgeon: Karie Soda, MD;  Location: [facility] ORS;  Service: General;  Laterality: N/A;   Patient Active Problem List   Diagnosis Date Noted   Primary osteoarthritis of right shoulder 03/14/2023   Biceps tendonitis on right 03/14/2023   Shoulder fracture 03/12/2023   S/P reverse total shoulder arthroplasty, right 03/12/2023   Arthritis of right knee 07/27/2022   S/P total knee arthroplasty, right 07/24/2022   Shoulder arthritis  S/P shoulder replacement, 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   Skin tags, multiple acquired 07/07/2019   LUQ pain 07/07/2019   OA (osteoarthritis) of knee 03/16/2019   Ventral hernia s/p lap repair with mesh 06/03/2018 06/03/2018   Hemorrhoids 08/22/2016   Anemia 03/16/2016   Right shoulder pain 02/26/2016   Abnormal CT scan, gastrointestinal tract suspect angioedema    Abdominal pain    Hyperglycemia 08/21/2013   Low back pain 10/10/2012   Hyperlipidemia    Hypertension, essential     Obesity (BMI 30.0-34.9)     PCP: Bradd Canary, MD   REFERRING PROVIDER: Cammy Copa, MD   REFERRING DIAG:  [phone] (ICD-10-CM) - S/P reverse total shoulder arthroplasty, right  M19.011  (ICD-10-CM) - Primary osteoarthritis of right shoulder  M75.21 (ICD-10-CM) - Biceps tendonitis on right   THERAPY DIAG:  Stiffness of right shoulder, not elsewhere classified  Acute pain of right shoulder  Abnormal posture  Muscle weakness (generalized)  RATIONALE FOR EVALUATION AND TREATMENT: Rehabilitation  ONSET DATE: 03/12/23 - R reverse TSA + biceps tenodesis  NEXT MD VISIT: 06/05/23   SUBJECTIVE:                                                                                                                                                                                                         SUBJECTIVE STATEMENT: Pt reports more tightness than pain today.  EVAL: Pt reports the PA released him from the sling as of his postop follow-up on Friday 03/27/23. He states he had the CPM at home 3x/day x 1.5 hrs for the first 2 weeks post, but now discontinued. Biggest concerns currently with opening jars and feeding himself with R hand. Still sleeping in the recliner as the bed is too uncomfortable. He has been completing distal UE HEP exercises 3x/day.  PAIN: Are you having pain? Yes: NPRS scale: 2-3/10 Pain location: R anterior shoulder  Pain description: sore, tightness Aggravating factors: raising his arm too high, working with the mouse on the computer Relieving factors: oxycodone, gabapentin and muscle relaxant, ice  PERTINENT HISTORY:  R TKA 07/23/22, L TSA 11/01/20, anxiety, arthritis, back pain, osteoporosis, prediabetes, ventral hernia repair  PRECAUTIONS: Shoulder  RED FLAGS: None  HAND DOMINANCE: Right  WEIGHT BEARING RESTRICTIONS: Yes NWB R UE  FALLS:  Has patient fallen in last 6 months? No  LIVING ENVIRONMENT: Lives with: lives with their spouse Lives in: House/apartment Stairs: Yes: Internal: 12 steps; on right going up and External: 2 steps; none Has following equipment at home: Single point cane and Walker - 2 wheeled  OCCUPATION: Retired  PLOF:  Independent and Leisure: Golf, yard work, gym - bike    PATIENT GOALS: "Better ROM and less pain."   OBJECTIVE: (objective measures completed at initial evaluation unless otherwise dated)  DIAGNOSTIC FINDINGS:  03/12/23 - DG Right shoulder - IMPRESSION: Interval reverse right shoulder arthroplasty without evidence ofhardware failure or loosening.  PATIENT SURVEYS:  Quick Dash 70.5 / 100 = 70.5 %  COGNITION: Overall cognitive status: Within functional limits for tasks assessed     SENSATION: WFL Pt reports intermittent gnawing "toothache" feeling in his L thumb, tingling has subsided  POSTURE: rounded shoulders and forward head  UPPER EXTREMITY ROM:   Passive ROM Right eval R 04/10/23 R 04/23/23  Shoulder flexion 100 120 120  Shoulder extension     Shoulder abduction 88 108 110  Shoulder adduction     Shoulder internal rotation     Shoulder external rotation 15 23 23   (Blank rows = not tested)  AROM assessment deferred on eval due to patient only 2 weeks postop Active ROM Right  supine 04/30/23  Shoulder flexion 140  Shoulder extension   Shoulder abduction 110  Shoulder adduction   Shoulder internal rotation   Shoulder external rotation 15  Elbow flexion   Elbow extension   Wrist flexion   Wrist extension   Wrist ulnar deviation   Wrist radial deviation   Wrist pronation   Wrist supination   (Blank rows = not tested)  UPPER EXTREMITY MMT: (Deferred on eval due to only 2 weeks postop)  MMT Right eval Left eval  Shoulder flexion    Shoulder extension    Shoulder abduction    Shoulder adduction    Shoulder internal rotation    Shoulder external rotation    Middle trapezius    Lower trapezius    Elbow flexion    Elbow extension    Wrist flexion    Wrist extension    Wrist ulnar deviation    Wrist radial deviation    Wrist pronation    Wrist supination    Grip strength (lbs)    (Blank rows = not tested)  PALPATION:  Mild TTP R pecs, biceps, teres  group    TODAY'S TREATMENT:  05/12/23 THERAPEUTIC EXERCISE: to improve flexibility, strength and mobility.  Demonstration, verbal and tactile cues throughout for technique. Pulleys - 3' each flexion and scaption in pain free ROM Supine shoulder flexion hands clasped together 2 x 10  Supine R shoulder "hitchhiker" scaption AROM 2 x 10 Supine R shoulder protraction 2x10 1# db Supine R shoulder CW/CCW circles at 90 flexion  2 x 10 S/L R shoulder abduction  MANUAL THERAPY: To promote normalized muscle tension, improved flexibility, improved joint mobility, increased ROM, and reduced pain. R shoulder flexion, scaption and ER PROM within limits of protocol and pain tolerance  STM/DTM and manual TPR to R biceps, teres group, LS & UT 05/04/23 THERAPEUTIC EXERCISE: to improve flexibility, strength and mobility.  Demonstration, verbal and tactile cues throughout for technique. Pulleys - 3' each flexion and scaption in pain free ROM Supine R shoulder protraction 2 x 10, 2nd set with 1# db Supine R shoulder CW/CCW circles at 90 flexion  2 x 10, 2nd set with 1# db Supine R shoulder "uppercut" flexion AROM x 10 Supine R shoulder "hitchhiker" scaption AROM x 10 S/L R scap retraction + shoulder ER x 15 S/L R shoulder scaption/ABD x 10  MANUAL THERAPY: To promote normalized muscle tension, improved flexibility, improved joint mobility,  increased ROM, and reduced pain. R shoulder flexion, scaption and ER PROM within limits of protocol and pain tolerance  STM/DTM and manual TPR to R biceps, teres group, lats, subscapularis, LS & UT Supine R shoulder alternating IR/ER isometrics with arm in ~45 scaption 2 x 30" Supine R shoulder rhythmic stabilization at 90 flexion 2 x 30"   04/30/23 THERAPEUTIC EXERCISE: to improve flexibility, strength and mobility.  Demonstration, verbal and tactile cues throughout for technique. Pulleys - 3' each flexion and scaption in pain free ROM Supine R shoulder flexion  AAROM with wand to tolerance 2 x 10 Supine R shoulder scaption AAROM with wand to tolerance 2 x 10 Supine R shoulder ER AAROM with wand to tolerance (towel roll under arm) 2 x 10  MANUAL THERAPY: To promote normalized muscle tension, improved flexibility, improved joint mobility, increased ROM, and reduced pain. R shoulder flexion, scaption and ER PROM to tolerance  Vaso to R shoulder 34 deg low compression x 10 min   04/27/23 THERAPEUTIC EXERCISE: to improve flexibility, strength and mobility.  Demonstration, verbal and tactile cues throughout for technique. Pulleys - 3' each flexion and scaption in pain free ROM Supine R shoulder flexion AAROM with wand to tolerance 2 x 10 Supine R shoulder scaption AAROM with wand to tolerance 2 x 10 Supine R shoulder ER AAROM with wand to tolerance (towel roll under arm) 2 x 10 Seated self-resisted R shoulder IR & ER submax (30-50% effort) isometrics 10 x 5" each Standing shoulder YTB scap retraction + shoulder row 10 x 5" Standing shoulder YTB scap retraction + shoulder extension 10 x 5"  MANUAL THERAPY: To promote normalized muscle tension, improved flexibility, improved joint mobility, increased ROM, and reduced pain. R shoulder flexion, scaption and ER PROM within limits of protocol and pain tolerance  STM/DTM and manual TPR to R pecs Incisional scar massage to full incision   PATIENT EDUCATION:  Education details: HEP progression - shoulder wand AAROM, shoulder IR/ER isometrics   Person educated: Patient Education method: Programmer, multimedia, Demonstration, Verbal cues, and Handouts Education comprehension: verbalized understanding, returned demonstration, verbal cues required, and needs further education  HOME EXERCISE PROGRAM: Access Code: [email or web address] URL: [email or web address] Date: 04/27/2023 Prepared by: Glenetta Hew  Exercises - Seated Shoulder Flexion Towel Slide at Table Top  - 2-3 x daily - 7 x weekly - 2 sets - 10 reps - 3  sec  hold - Seated Shoulder Scaption Slide at Table Top with Forearm in Neutral  - 2-3 x daily - 7 x weekly - 2 sets - 10 reps - 3 sec hold - Seated Shoulder Setting  - 2-3 x daily - 7 x weekly - 2 sets - 10 reps - 3 sec hold - Isometric Shoulder Flexion at Wall (Mirrored)  - 1-2 x daily - 7 x weekly - 2 sets - 5 reps - 5 sec hold - Isometric Shoulder Abduction at Wall (Mirrored)  - 1-2 x daily - 7 x weekly - 2 sets - 5 reps - 5 sec hold - Isometric Shoulder Extension at Wall (Mirrored)  - 1-2 x daily - 7 x weekly - 2 sets - 5 reps - 5 sec hold - Gentle Vertical Shoulder Pendulum  - 2-3 x daily - 7 x weekly - 2 sets - 10 reps - Gentle Horizontal Shoulder Pendulum  - 2-3 x daily - 7 x weekly - 2 sets - 10 reps - Circular Shoulder Pendulum with Table Support  - 2-3 x daily - 7 x  weekly - 2 sets - 10 reps - Supine Shoulder Flexion AAROM with Dowel  - 1-2 x daily - 7 x weekly - 2 sets - 10 reps - 3 sec hold - Supine Shoulder Scaption with Dowel  - 1-2 x daily - 7 x weekly - 2 sets - 10 reps - 3 sec hold - Supine Shoulder External Rotation with Dowel at 20-30 Degrees of Abduction  - 1-2 x daily - 7 x weekly - 2 sets - 10 reps - 3 sec hold - Isometric Shoulder Internal Rotation (Mirrored)  - 1 x daily - 7 x weekly - 2 sets - 5 reps - 5 sec hold - Isometric Shoulder External Rotation (Mirrored)  - 1 x daily - 7 x weekly - 2 sets - 5 reps - 5 sec hold   ASSESSMENT:  CLINICAL IMPRESSION: Continued progression of exercises for mobility to tolerance. He does have soft tissue restriction in the deltoids and UT/LS. He continues to show some weakness in the R shoulder with ER and abduction.  [name] is progressing well per his rehab protocol and goals and will benefit from continued skilled PT to address ongoing deficits to improve mobility and activity tolerance with decreased pain interference.   OBJECTIVE IMPAIRMENTS: decreased activity tolerance, decreased coordination, decreased knowledge of condition,  decreased ROM, decreased strength, increased fascial restrictions, impaired perceived functional ability, increased muscle spasms, impaired flexibility, impaired sensation, impaired UE functional use, postural dysfunction, and pain.   ACTIVITY LIMITATIONS: carrying, lifting, sleeping, bathing, dressing, reach over head, hygiene/grooming, and caring for others  PARTICIPATION LIMITATIONS: meal prep, cleaning, laundry, driving, shopping, community activity, and yard work  PERSONAL FACTORS: Past/current experiences, Time since onset of injury/illness/exacerbation, and 3+ comorbidities: R TKA 07/23/22, L TSA 11/01/20, anxiety, arthritis, back pain, osteoporosis, prediabetes, ventral hernia repair  are also affecting patient's functional outcome.   REHAB POTENTIAL: Good  CLINICAL DECISION MAKING: Stable/uncomplicated  EVALUATION COMPLEXITY: Low   GOALS: Goals reviewed with patient? Yes  SHORT TERM GOALS: Target date: 04/27/2023  Patient will be independent with initial HEP to improve outcomes and carryover.  Baseline:  Goal status: MET - 04/10/23  2.  Patient will report 25% improvement in R shoulder pain to improve tolerance for exercise and activity.  Baseline: 6/10 Goal status: MET - 04/10/23 - Pain 3-4/10  LONG TERM GOALS: Target date: 05/25/2023  Patient will be independent with ongoing/advanced HEP for self-management at home.  Baseline:  Goal status: IN PROGRESS - 04/27/23 - met for current HEP, updated today  2.  Patient will report 75% improvement in R shoulder pain to improve QOL.  Baseline: 6/10 Goal status: IN PROGRESS - 04/30/23 - 70%   3.  Patient to demonstrate improved upright posture with posterior shoulder girdle engaged to promote improved glenohumeral joint mobility. Baseline: Forward head and rounded shoulder posture Goal status: IN PROGRESS  4.  Patient to improve R shoulder AROM to [facility] without pain provocation to allow for increased ease of ADLs.  Baseline:  Refer to above UE ROM table Goal status: IN PROGRESS - 04/30/23 - see chart  5.  Patient will demonstrate improved R shoulder strength to >/= 4 to 4+/5 for functional UE use. Baseline: Refer to above UE MMT table Goal status: IN PROGRESS - 04/30/23 - non testing now  6  Patient will report </= 60% on QuickDASH to demonstrate improved functional ability (MDIC = 10).  Baseline: 70.5 / 100 = 70.5 % Goal status: IN PROGRESS  7.  Patient to report  ability to perform ADLs, household, and leisure-related tasks without limitation due to R shoulder pain, LOM or weakness.   Baseline: Limited with all activities requiring use of R arm Goal status: IN PROGRESS - 04/30/23 - pt now able to shave, drink out of a cup   PLAN:  PT FREQUENCY: 2x/week  PT DURATION: 8 weeks  PLANNED INTERVENTIONS: Therapeutic exercises, Therapeutic activity, Neuromuscular re-education, Patient/Family education, Self Care, Joint mobilization, Dry Needling, Electrical stimulation, Cryotherapy, Moist heat, Taping, Vasopneumatic device, Ultrasound, Ionotophoresis 4mg /ml Dexamethasone, Manual therapy, and Re-evaluation  PLAN FOR NEXT SESSION: Per reverse TSA protocol (Sx 03/12/23) - post-op week #9 as of 05/14/23 - continue with PROM & gentle AA/AROM - gradually increasing forward flexion and elevation in the scapular plane in supine, progressing to sitting/standing; periscapular deltoid submaximal pain-free isometrics in scapular plane and gentle GH IR & ER sub-max pain free isometrics; resisted exercises of elbow, wrist and hand   Darcel Zick L Marvon [place], PTA 05/12/2023, 11:49 AM

## 2023-05-13 NOTE — Assessment & Plan Note (Signed)
hgba1c acceptable, minimize simple carbs. Increase exercise as tolerated.  

## 2023-05-13 NOTE — Assessment & Plan Note (Signed)
Encouraged DASH diet, decrease po intake and increase exercise as tolerated. Needs 7-8 hours of sleep nightly. Avoid trans fats, eat small, frequent meals every 4-5 hours with lean proteins, complex carbs and healthy fats. Minimize simple carbs 

## 2023-05-13 NOTE — Assessment & Plan Note (Signed)
Well controlled, no changes to meds. Encouraged heart healthy diet such as the DASH diet and exercise as tolerated.  °

## 2023-05-13 NOTE — Assessment & Plan Note (Signed)
Tolerating statin, encouraged heart healthy diet, avoid trans fats, minimize simple carbs and saturated fats. Increase exercise as tolerated 

## 2023-05-14 ENCOUNTER — Ambulatory Visit (INDEPENDENT_AMBULATORY_CARE_PROVIDER_SITE_OTHER): Payer: Medicare Other | Admitting: Family Medicine

## 2023-05-14 ENCOUNTER — Ambulatory Visit: Payer: Medicare Other

## 2023-05-14 ENCOUNTER — Encounter: Payer: Self-pay | Admitting: Family Medicine

## 2023-05-14 VITALS — BP 138/82 | HR 68 | Temp 98.2°F | Resp 16 | Ht 70.0 in | Wt 207.0 lb

## 2023-05-14 DIAGNOSIS — F419 Anxiety disorder, unspecified: Secondary | ICD-10-CM

## 2023-05-14 DIAGNOSIS — E66811 Obesity, class 1: Secondary | ICD-10-CM | POA: Diagnosis not present

## 2023-05-14 DIAGNOSIS — M1712 Unilateral primary osteoarthritis, left knee: Secondary | ICD-10-CM

## 2023-05-14 DIAGNOSIS — M6281 Muscle weakness (generalized): Secondary | ICD-10-CM | POA: Diagnosis not present

## 2023-05-14 DIAGNOSIS — R739 Hyperglycemia, unspecified: Secondary | ICD-10-CM | POA: Diagnosis not present

## 2023-05-14 DIAGNOSIS — R293 Abnormal posture: Secondary | ICD-10-CM

## 2023-05-14 DIAGNOSIS — I10 Essential (primary) hypertension: Secondary | ICD-10-CM

## 2023-05-14 DIAGNOSIS — K130 Diseases of lips: Secondary | ICD-10-CM

## 2023-05-14 DIAGNOSIS — F4310 Post-traumatic stress disorder, unspecified: Secondary | ICD-10-CM | POA: Diagnosis not present

## 2023-05-14 DIAGNOSIS — L578 Other skin changes due to chronic exposure to nonionizing radiation: Secondary | ICD-10-CM | POA: Diagnosis not present

## 2023-05-14 DIAGNOSIS — Z96611 Presence of right artificial shoulder joint: Secondary | ICD-10-CM | POA: Diagnosis not present

## 2023-05-14 DIAGNOSIS — M25611 Stiffness of right shoulder, not elsewhere classified: Secondary | ICD-10-CM | POA: Diagnosis not present

## 2023-05-14 DIAGNOSIS — M25511 Pain in right shoulder: Secondary | ICD-10-CM

## 2023-05-14 DIAGNOSIS — E785 Hyperlipidemia, unspecified: Secondary | ICD-10-CM | POA: Diagnosis not present

## 2023-05-14 DIAGNOSIS — K5900 Constipation, unspecified: Secondary | ICD-10-CM | POA: Diagnosis not present

## 2023-05-14 MED ORDER — ALPRAZOLAM 1 MG PO TABS: ORAL_TABLET | ORAL | 1 refills | Status: DC

## 2023-05-14 MED ORDER — LOSARTAN POTASSIUM 50 MG PO TABS: 50.0000 mg | ORAL_TABLET | Freq: Every day | ORAL | 3 refills | Status: DC

## 2023-05-14 NOTE — Assessment & Plan Note (Signed)
S/P TKR earlier this year in right knee

## 2023-05-14 NOTE — Therapy (Signed)
OUTPATIENT PHYSICAL THERAPY TREATMENT    Patient Name: [name] MRN: [id number] DOB:Sep 21, 1945, 77 y.o., male Today's Date: 05/14/2023   END OF SESSION:  PT End of Session - 05/14/23 1149     Visit Number 13    Date for PT Re-Evaluation 05/25/23    Authorization Type Medicare & Tricare    Progress Note Due on Visit 20    PT Start Time 1105    PT Stop Time 1147    PT Time Calculation (min) 42 min    Activity Tolerance Patient tolerated treatment well    Behavior During Therapy [facility] for tasks assessed/performed                      Past Medical History:  Diagnosis Date   Abnormal CT scan, gastrointestinal tract suspect angioedema    Anemia 03/16/2016   IRON   Anxiety    Arthritis    Back pain 10/10/2012   Fatty liver 2005   seen on 2005 ultrasound,05/2015 CT.    Hemorrhoids 08/22/2016   Hemorrhoids, internal, with bleeding 2005   internal and external. 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    ENTEROSCOPY N/A 10/05/2017   Procedure: ENTEROSCOPY;  Surgeon: Iva Boop, MD;  Location: [facility] ENDOSCOPY;  Service: Endoscopy;  Laterality: N/A;   FRACTURE SURGERY Left 1989   leg   HEMORRHOID BANDING  2005   IR RADIOLOGIST EVAL & MGMT  11/11/2021    KNEE SURGERY Left 2010   arthroscopy, torn carilage   KNEE SURGERY Right 2012   arthroscopy   REVERSE SHOULDER ARTHROPLASTY Right 03/12/2023   Procedure: REVERSE SHOULDER ARTHROPLASTY;  Surgeon: Cammy Copa, MD;  Location: [facility] OR;  Service: Orthopedics;  Laterality: Right;   TONSILLECTOMY  1957   TOTAL KNEE ARTHROPLASTY Right 07/24/2022   Procedure: RIGHT TOTAL KNEE ARTHROPLASTY;  Surgeon: Cammy Copa, MD;  Location: [facility] OR;  Service: Orthopedics;  Laterality: Right;   TOTAL SHOULDER ARTHROPLASTY Left 11/01/2020   Procedure: LEFT ANATOMIC SHOULDER REPLACEMENT;  Surgeon: Cammy Copa, MD;  Location: [facility] OR;  Service: Orthopedics;  Laterality: Left;   VENTRAL HERNIA REPAIR N/A 06/03/2018   Procedure: LAPAROSCOPIC VENTRAL WALL  HERNIA  REPAIR WITH MESH  ERAS PATHWAY;  Surgeon: Karie Soda, MD;  Location: [facility] ORS;  Service: General;  Laterality: N/A;   Patient Active Problem List   Diagnosis Date Noted   Primary osteoarthritis of right shoulder 03/14/2023   Biceps tendonitis on right 03/14/2023   Shoulder fracture 03/12/2023   S/P reverse total shoulder arthroplasty, right 03/12/2023   Arthritis of right knee 07/27/2022   S/P total knee arthroplasty, right 07/24/2022   Shoulder arthritis  S/P shoulder replacement, 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   Skin tags, multiple acquired 07/07/2019   LUQ pain 07/07/2019   OA (osteoarthritis) of knee 03/16/2019   Ventral hernia s/p lap repair with mesh 06/03/2018 06/03/2018   Hemorrhoids 08/22/2016   Anemia 03/16/2016   Right shoulder pain 02/26/2016   Abnormal CT scan, gastrointestinal tract suspect angioedema    Abdominal pain    Hyperglycemia 08/21/2013   Low back pain 10/10/2012   Hyperlipidemia    Hypertension, essential     Obesity (BMI 30.0-34.9)     PCP: Bradd Canary, MD   REFERRING PROVIDER: Cammy Copa, MD   REFERRING DIAG:  [phone] (ICD-10-CM) - S/P reverse total shoulder arthroplasty, right  M19.011  (ICD-10-CM) - Primary osteoarthritis of right shoulder  M75.21 (ICD-10-CM) - Biceps tendonitis on right   THERAPY DIAG:  Stiffness of right shoulder, not elsewhere classified  Acute pain of right shoulder  Abnormal posture  Muscle weakness (generalized)  RATIONALE FOR EVALUATION AND TREATMENT: Rehabilitation  ONSET DATE: 03/12/23 - R reverse TSA + biceps tenodesis  NEXT MD VISIT: 06/05/23   SUBJECTIVE:                                                                                                                                                                                                         SUBJECTIVE STATEMENT: Just a little soreness today.  EVAL: Pt reports the PA released him from the sling as of his postop follow-up on Friday 03/27/23. He states he had the CPM at home 3x/day x 1.5 hrs for the first 2 weeks post, but now discontinued. Biggest concerns currently with opening jars and feeding himself with R hand. Still sleeping in the recliner as the bed is too uncomfortable. He has been completing distal UE HEP exercises 3x/day.  PAIN: Are you having pain? Yes: NPRS scale: 2-3/10 Pain location: R anterior shoulder  Pain description: sore, tightness Aggravating factors: raising his arm too high, working with the mouse on the computer Relieving factors: oxycodone, gabapentin and muscle relaxant, ice  PERTINENT HISTORY:  R TKA 07/23/22, L TSA 11/01/20, anxiety, arthritis, back pain, osteoporosis, prediabetes, ventral hernia repair  PRECAUTIONS: Shoulder  RED FLAGS: None  HAND DOMINANCE: Right  WEIGHT BEARING RESTRICTIONS: Yes NWB R UE  FALLS:  Has patient fallen in last 6 months? No  LIVING ENVIRONMENT: Lives with: lives with their spouse Lives in: House/apartment Stairs: Yes: Internal: 12 steps; on right going up and External: 2 steps; none Has following equipment at home: Single point cane and Walker - 2 wheeled  OCCUPATION:  Retired  [employer]: Independent and  Leisure: Golf, yard work, gym - bike    PATIENT GOALS: "Better ROM and less pain."   OBJECTIVE: (objective measures completed at initial evaluation unless otherwise dated)  DIAGNOSTIC FINDINGS:  03/12/23 - DG Right shoulder - IMPRESSION: Interval reverse right shoulder arthroplasty without evidence ofhardware failure or loosening.  PATIENT SURVEYS:  Quick Dash 70.5 / 100 = 70.5 %  COGNITION: Overall cognitive status: Within functional limits for tasks assessed     SENSATION: WFL Pt reports intermittent gnawing "toothache" feeling in his L thumb, tingling has subsided  POSTURE: rounded shoulders and forward head  UPPER EXTREMITY ROM:   Passive ROM Right eval R 04/10/23 R 04/23/23  Shoulder flexion 100 120 120  Shoulder extension     Shoulder abduction 88 108 110  Shoulder adduction     Shoulder internal rotation     Shoulder external rotation 15 23 23   (Blank rows = not tested)  AROM assessment deferred on eval due to patient only 2 weeks postop Active ROM Right  supine 04/30/23 R 05/14/23  Shoulder flexion 140 125  Shoulder extension    Shoulder abduction 110 120  Shoulder adduction    Shoulder internal rotation    Shoulder external rotation 15 26  Elbow flexion    Elbow extension    Wrist flexion    Wrist extension    Wrist ulnar deviation    Wrist radial deviation    Wrist pronation    Wrist supination    (Blank rows = not tested)  UPPER EXTREMITY MMT: (Deferred on eval due to only 2 weeks postop)  MMT Right eval Left eval  Shoulder flexion    Shoulder extension    Shoulder abduction    Shoulder adduction    Shoulder internal rotation    Shoulder external rotation    Middle trapezius    Lower trapezius    Elbow flexion    Elbow extension    Wrist flexion    Wrist extension    Wrist ulnar deviation    Wrist radial deviation    Wrist pronation    Wrist supination    Grip strength (lbs)    (Blank rows = not tested)  PALPATION:  Mild TTP R  pecs, biceps, teres group    TODAY'S TREATMENT:  05/14/23 THERAPEUTIC EXERCISE: to improve flexibility, strength and mobility.  Demonstration, verbal and tactile cues throughout for technique. Pulleys - 3' each flexion and scaption in pain free ROM AAROM shoulder flexion wall slides 2x12 Measured UE ROM Standing rows YTB 2x10 Standing shoulder extension x 15 YTB Supine R shoulder protraction x 10 2lb Supine R CW/CCW circles 2x10 2#  Supine shoulder flexion AAROM with wand 10x5"  05/12/23 THERAPEUTIC EXERCISE: to improve flexibility, strength and mobility.  Demonstration, verbal and tactile cues throughout for technique. Pulleys - 3' each flexion and scaption in pain free ROM Supine shoulder flexion hands clasped together 2 x 10  Supine R shoulder "hitchhiker" scaption AROM 2 x 10 Supine R shoulder protraction 2x10 1# db Supine R shoulder CW/CCW circles at 90 flexion  2 x 10 S/L R shoulder abduction  MANUAL THERAPY: To promote normalized muscle tension, improved flexibility, improved joint mobility, increased ROM, and reduced pain. R shoulder flexion, scaption and ER PROM within limits of protocol and pain tolerance  STM/DTM and manual TPR to R biceps, teres group, LS & UT 05/04/23 THERAPEUTIC EXERCISE: to improve flexibility, strength and mobility.  Demonstration, verbal and tactile cues throughout for  technique. Pulleys - 3' each flexion and scaption in pain free ROM Supine R shoulder protraction 2 x 10, 2nd set with 1# db Supine R shoulder CW/CCW circles at 90 flexion  2 x 10, 2nd set with 1# db Supine R shoulder "uppercut" flexion AROM x 10 Supine R shoulder "hitchhiker" scaption AROM x 10 S/L R scap retraction + shoulder ER x 15 S/L R shoulder scaption/ABD x 10  MANUAL THERAPY: To promote normalized muscle tension, improved flexibility, improved joint mobility, increased ROM, and reduced pain. R shoulder flexion, scaption and ER PROM within limits of protocol and pain  tolerance  STM/DTM and manual TPR to R biceps, teres group, lats, subscapularis, LS & UT Supine R shoulder alternating IR/ER isometrics with arm in ~45 scaption 2 x 30" Supine R shoulder rhythmic stabilization at 90 flexion 2 x 30"   04/30/23 THERAPEUTIC EXERCISE: to improve flexibility, strength and mobility.  Demonstration, verbal and tactile cues throughout for technique. Pulleys - 3' each flexion and scaption in pain free ROM Supine R shoulder flexion AAROM with wand to tolerance 2 x 10 Supine R shoulder scaption AAROM with wand to tolerance 2 x 10 Supine R shoulder ER AAROM with wand to tolerance (towel roll under arm) 2 x 10  MANUAL THERAPY: To promote normalized muscle tension, improved flexibility, improved joint mobility, increased ROM, and reduced pain. R shoulder flexion, scaption and ER PROM to tolerance  Vaso to R shoulder 34 deg low compression x 10 min   04/27/23 THERAPEUTIC EXERCISE: to improve flexibility, strength and mobility.  Demonstration, verbal and tactile cues throughout for technique. Pulleys - 3' each flexion and scaption in pain free ROM Supine R shoulder flexion AAROM with wand to tolerance 2 x 10 Supine R shoulder scaption AAROM with wand to tolerance 2 x 10 Supine R shoulder ER AAROM with wand to tolerance (towel roll under arm) 2 x 10 Seated self-resisted R shoulder IR & ER submax (30-50% effort) isometrics 10 x 5" each Standing shoulder YTB scap retraction + shoulder row 10 x 5" Standing shoulder YTB scap retraction + shoulder extension 10 x 5"  MANUAL THERAPY: To promote normalized muscle tension, improved flexibility, improved joint mobility, increased ROM, and reduced pain. R shoulder flexion, scaption and ER PROM within limits of protocol and pain tolerance  STM/DTM and manual TPR to R pecs Incisional scar massage to full incision   PATIENT EDUCATION:  Education details: HEP progression - shoulder wand AAROM, shoulder IR/ER isometrics    Person educated: Patient Education method: Programmer, multimedia, Demonstration, Verbal cues, and Handouts Education comprehension: verbalized understanding, returned demonstration, verbal cues required, and needs further education  HOME EXERCISE PROGRAM: Access Code: [email or web address] URL: [email or web address] Date: 05/14/2023 Prepared by: Verta Ellen  Exercises - Supine Shoulder Flexion AAROM with Dowel  - 1-2 x daily - 7 x weekly - 2 sets - 10 reps - 3 sec hold - Supine Shoulder Scaption with Dowel  - 1-2 x daily - 7 x weekly - 2 sets - 10 reps - 3 sec hold - Supine Shoulder External Rotation with Dowel at 20-30 Degrees of Abduction  - 1-2 x daily - 7 x weekly - 2 sets - 10 reps - 3 sec hold - Sidelying Shoulder External Rotation (Mirrored)  - 1 x daily - 3 x weekly - 2 sets - 10 reps - Sidelying Shoulder Abduction Palm Forward  - 1 x daily - 3 x weekly - 2 sets - 10 reps - Standing Wall [employer] with Mini  Swiss Ball  - 1 x daily - 3-4 x weekly - 1 sets - 10 reps - Standing Wall [employer] in Scaption with Mini Swiss Ball  - 1 x daily - 3-4 x weekly - 1 sets - 10 reps - Shoulder extension with resistance - Neutral  - 1 x daily - 7 x weekly - 2 sets - 10 reps - Standing Shoulder Row with Anchored Resistance  - 1 x daily - 7 x weekly - 2 sets - 10 reps   ASSESSMENT:  CLINICAL IMPRESSION: Continued progression of exercises for mobility and gentle strength to tolerance. He shows good ROM but could still use some work on stretching to get full mobility of R shoulder. He requires cues to avoid ballistic movements and to avoid painful movement.  Maclin is progressing well and will benefit from continued skilled PT to address ongoing deficits to improve mobility and activity tolerance with decreased pain interference.   OBJECTIVE IMPAIRMENTS: decreased activity tolerance, decreased coordination, decreased knowledge of condition, decreased ROM, decreased strength, increased fascial  restrictions, impaired perceived functional ability, increased muscle spasms, impaired flexibility, impaired sensation, impaired UE functional use, postural dysfunction, and pain.   ACTIVITY LIMITATIONS: carrying, lifting, sleeping, bathing, dressing, reach over head, hygiene/grooming, and caring for others  PARTICIPATION LIMITATIONS: meal prep, cleaning, laundry, driving, shopping, community activity, and yard work  PERSONAL FACTORS: Past/current experiences, Time since onset of injury/illness/exacerbation, and 3+ comorbidities: R TKA 07/23/22, L TSA 11/01/20, anxiety, arthritis, back pain, osteoporosis, prediabetes, ventral hernia repair  are also affecting patient's functional outcome.   REHAB POTENTIAL: Good  CLINICAL DECISION MAKING: Stable/uncomplicated  EVALUATION COMPLEXITY: Low   GOALS: Goals reviewed with patient? Yes  SHORT TERM GOALS: Target date: 04/27/2023  Patient will be independent with initial HEP to improve outcomes and carryover.  Baseline:  Goal status: MET - 04/10/23  2.  Patient will report 25% improvement in R shoulder pain to improve tolerance for exercise and activity.  Baseline: 6/10 Goal status: MET - 04/10/23 - Pain 3-4/10  LONG TERM GOALS: Target date: 05/25/2023  Patient will be independent with ongoing/advanced HEP for self-management at home.  Baseline:  Goal status: IN PROGRESS - 04/27/23 - met for current HEP, updated today  2.  Patient will report 75% improvement in R shoulder pain to improve QOL.  Baseline: 6/10 Goal status: IN PROGRESS - 04/30/23 - 70%   3.  Patient to demonstrate improved upright posture with posterior shoulder girdle engaged to promote improved glenohumeral joint mobility. Baseline: Forward head and rounded shoulder posture Goal status: IN PROGRESS  4.  Patient to improve R shoulder AROM to [facility] without pain provocation to allow for increased ease of ADLs.  Baseline: Refer to above UE ROM table Goal status: IN PROGRESS -  04/30/23 - see chart  5.  Patient will demonstrate improved R shoulder strength to >/= 4 to 4+/5 for functional UE use. Baseline: Refer to above UE MMT table Goal status: IN PROGRESS - 04/30/23 - non testing now  6  Patient will report </= 60% on QuickDASH to demonstrate improved functional ability (MDIC = 10).  Baseline: 70.5 / 100 = 70.5 % Goal status: IN PROGRESS  7.  Patient to report ability to perform ADLs, household, and leisure-related tasks without limitation due to R shoulder pain, LOM or weakness.   Baseline: Limited with all activities requiring use of R arm Goal status: IN PROGRESS - 04/30/23 - pt now able to shave, drink out of a cup  PLAN:  PT FREQUENCY: 2x/week  PT DURATION: 8 weeks  PLANNED INTERVENTIONS: Therapeutic exercises, Therapeutic activity, Neuromuscular re-education, Patient/Family education, Self Care, Joint mobilization, Dry Needling, Electrical stimulation, Cryotherapy, Moist heat, Taping, Vasopneumatic device, Ultrasound, Ionotophoresis 4mg /ml Dexamethasone, Manual therapy, and Re-evaluation  PLAN FOR NEXT SESSION: Per reverse TSA protocol (Sx 03/12/23) - post-op week #9 as of 05/14/23 - continue with PROM & gentle AA/AROM - gradually increasing forward flexion and elevation in the scapular plane in supine, progressing to sitting/standing; periscapular deltoid submaximal pain-free isometrics in scapular plane and gentle GH IR & ER sub-max pain free isometrics; resisted exercises of elbow, wrist and hand   Pranit Owensby L Grayland Daisey, PTA 05/14/2023, 11:50 AM

## 2023-05-14 NOTE — Assessment & Plan Note (Signed)
Had surgery replaced in September, is still in PT and is improving

## 2023-05-14 NOTE — Progress Notes (Signed)
Subjective:    Patient ID: [name], male    DOB: 12-25-1945, 77 y.o.   MRN: [id number]  Chief Complaint  Patient presents with  . Annual Exam    HPI Discussed the use of AI scribe software for clinical note transcription with the patient, who gave verbal consent to proceed.  History of Present Illness   The patient, with a history of hypertension, high cholesterol, and PTSD, presents for a routine follow-up. They report ongoing recovery from shoulder and knee replacements performed earlier in the year. The patient notes improvement in shoulder mobility and reduction in pain since the surgery. They also mention continued rehabilitation for the shoulder. The knee replacement, performed earlier in the year, is also improving after being drained three weeks ago.  The patient also reports a history of PTSD, which is managed with occasional use of alprazolam, taken approximately once or twice a month when symptoms of racing thoughts and insomnia occur. They express satisfaction with this management strategy and do not report any significant side effects or concerns.  The patient also mentions a skin condition that causes itching, particularly in dry patches. They have been managing this with over-the-counter lotions and good hydration. They also point out a spot on their upper lip that has been present for about a year.  The patient's hypertension and high cholesterol are managed with losartan and a cholesterol medication, respectively. They report adherence to these medications and do not report any side effects or concerns.        Past Medical History:  Diagnosis Date  . Abnormal CT scan, gastrointestinal tract suspect angioedema   . Anemia 03/16/2016   IRON  . Anxiety   . Arthritis   . Back pain 10/10/2012  . Fatty liver 2005   seen on 2005 ultrasound,05/2015 CT.   Marland Kitchen Hemorrhoids 08/22/2016  . Hemorrhoids, internal, with bleeding 2005   internal and external. 2005 band  ligation (dr Noe Gens in [facility])  . Hyperglycemia 08/21/2013  . Hyperlipidemia   . Hypertension   . Nocturia 08/21/2013  . Obesity (BMI 30.0-34.9)   . Osteoporosis   . Pneumonia    as a child  . Pre-diabetes   . Rectal bleeding 07/30/2014  . Renal cyst    Left Kidney  . SBO (small bowel obstruction) (HCC)   . 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 Unspecified constipation 02/06/2014  . Vitamin D deficiency     Past Surgical History:  Procedure Laterality Date  . BICEPT TENODESIS Right 03/12/2023   Procedure: BICEPS TENODESIS;  Surgeon: Cammy Copa, MD;  Location: [facility] OR;  Service: Orthopedics;  Laterality: Right;  . CATARACT EXTRACTION W/ INTRAOCULAR LENS IMPLANT Bilateral   . COLONOSCOPY  before 2005, 2012, 2013, 08/2014.   Marland Kitchen ENTEROSCOPY N/A 10/05/2017   Procedure: ENTEROSCOPY;  Surgeon: Iva Boop, MD;  Location: [facility] ENDOSCOPY;  Service: Endoscopy;  Laterality: N/A;  . FRACTURE SURGERY Left 1989   leg  . HEMORRHOID BANDING  2005  . IR RADIOLOGIST EVAL & MGMT  11/11/2021  . KNEE SURGERY Left 2010   arthroscopy, torn carilage  . KNEE SURGERY Right 2012   arthroscopy  . REVERSE SHOULDER ARTHROPLASTY Right 03/12/2023   Procedure: REVERSE SHOULDER ARTHROPLASTY;  Surgeon: Cammy Copa, MD;  Location: [facility] OR;  Service: Orthopedics;  Laterality: Right;  . TONSILLECTOMY  1957  . TOTAL KNEE ARTHROPLASTY Right 07/24/2022   Procedure: RIGHT TOTAL KNEE ARTHROPLASTY;  Surgeon: Cammy Copa, MD;  Location: [facility] OR;  Service: Orthopedics;  Laterality: Right;  . TOTAL SHOULDER ARTHROPLASTY Left 11/01/2020   Procedure: LEFT ANATOMIC SHOULDER REPLACEMENT;  Surgeon: Cammy Copa, MD;  Location: [facility] OR;  Service: Orthopedics;  Laterality: Left;  Marland Kitchen VENTRAL HERNIA REPAIR N/A 06/03/2018   Procedure: LAPAROSCOPIC VENTRAL WALL  HERNIA  REPAIR WITH MESH  ERAS PATHWAY;  Surgeon: Karie Soda, MD;  Location: [facility] ORS;  Service: General;  Laterality: N/A;     Family History  Problem Relation Age of Onset  . Stroke Mother   . Aneurysm Mother        brain  . Arthritis Father   . Liver cancer Father        liver, atomic testing in [employer]  . Gallbladder disease Father   . Hyperlipidemia Sister   . Hypertension Sister   . Obesity Sister   . Diabetes Sister   . Diabetes Brother   . Hyperlipidemia Sister   . Hypertension Sister   . Obesity Sister   . Diabetes Sister   . Aneurysm Brother        brain  . COPD Brother   . Gallbladder disease Sister        x4  . Colon cancer Neg Hx   . Colon polyps Neg Hx   . Esophageal cancer Neg Hx   . Rectal cancer Neg Hx   . Stomach cancer Neg Hx   . Prostate cancer Neg Hx   . CAD Neg Hx     Social History   Socioeconomic History  . Marital status: Married    Spouse name: Not on file  . Number of children: 1  . Years of education: Not on file  . Highest education level: Not on file  Occupational History  . Occupation: Retired    Associate Professor: [employer]  Tobacco Use  . Smoking status: Never  . Smokeless tobacco: Never  Vaping Use  . Vaping status: Never Used  Substance and Sexual Activity  . Alcohol use: Not Currently    Comment: Occassionally  . Drug use: No  . Sexual activity: Yes  Other Topics Concern  . Not on file  Social History Narrative   Married - 1 child   Retired from [employer]   Some EtOH, no tbacco/drug use   Social Determinants of Health   Financial Resource Strain: Low Risk  (05/05/2023)   Overall Financial Resource Strain (CARDIA)   . Difficulty of Paying Living Expenses: Not hard at all  Food Insecurity: No Food Insecurity (05/05/2023)   Hunger Vital Sign   . Worried About Programme researcher, broadcasting/film/video in the Last Year: Never true   . Ran Out of Food in the Last Year: Never true  Transportation Needs: No Transportation Needs (05/05/2023)   PRAPARE - Transportation   . Lack of Transportation (Medical): No   . Lack of Transportation (Non-Medical): No  Physical  Activity: Sufficiently Active (05/05/2023)   Exercise Vital Sign   . Days of Exercise per Week: 3 days   . Minutes of Exercise per Session: 120 min  Stress: No Stress Concern Present (05/05/2023)   [employer] of Occupational Health - Occupational Stress Questionnaire   . Feeling of Stress : Not at all  Social Connections: Socially Integrated (05/05/2023)   Social Connection and Isolation Panel [NHANES]   . Frequency of Communication with Friends and Family: More than three times a week   . Frequency of Social Gatherings with Friends and Family: More than three times a  week   . Attends Religious Services: More than 4 times per year   . Active Member of Clubs or Organizations: Yes   . Attends Banker Meetings: More than 4 times per year   . Marital Status: Married  Catering manager Violence: Not At Risk (05/05/2023)   Humiliation, Afraid, Rape, and Kick questionnaire   . Fear of Current or Ex-Partner: No   . Emotionally Abused: No   . Physically Abused: No   . Sexually Abused: No    Outpatient Medications Prior to Visit  Medication Sig Dispense Refill  . acetaminophen (TYLENOL) 325 MG tablet Take 1-2 tablets (325-650 mg total) by mouth every 6 (six) hours as needed for mild pain (pain score 1-3 or temp > 100.5). 45 tablet 0  . amLODipine (NORVASC) 10 MG tablet TAKE 1 TABLET DAILY 90 tablet 3  . aspirin 81 MG chewable tablet Chew 1 tablet (81 mg total) by mouth 2 (two) times daily. 60 tablet 0  . gabapentin (NEURONTIN) 100 MG capsule Take 1 capsule (100 mg total) by mouth 2 (two) times daily. 60 capsule 1  . Iron, Ferrous Sulfate, 325 (65 Fe) MG TABS Take 325 mg by mouth daily. 30 tablet 1  . methocarbamol (ROBAXIN) 500 MG tablet Take 1 tablet (500 mg total) by mouth every 8 (eight) hours as needed for muscle spasms. 30 tablet 0  . metoprolol succinate (TOPROL-XL) 50 MG 24 hr tablet TAKE AS INSTRUCTED BY YOUR PRESCRIBER (Patient taking differently: Take 50 mg by mouth  daily.) 90 tablet 3  . Multiple Vitamins-Minerals (MULTIVITAMIN ADULT PO) Take 2 tablets by mouth daily.    Marland Kitchen oxyCODONE (OXY IR/ROXICODONE) 5 MG immediate release tablet Take 1 tablet (5 mg total) by mouth every 12 (twelve) hours as needed for moderate pain (pain score 4-6) (pain score 4-6). 20 tablet 0  . rosuvastatin (CRESTOR) 20 MG tablet Take 1 tablet (20 mg total) by mouth daily. 90 tablet 3  . tamsulosin (FLOMAX) 0.4 MG CAPS capsule Take 0.4 mg by mouth daily.    Marland Kitchen triamcinolone cream (KENALOG) 0.1 % Apply 1 Application topically 2 (two) times daily. 45 g 0  . ALPRAZolam (XANAX) 1 MG tablet TAKE 1 TABLET (1 MG TOTAL) BY MOUTH AT BEDTIME AS NEEDED FOR ANXIETY. 30 tablet 0  . losartan (COZAAR) 50 MG tablet TAKE 1 TABLET DAILY (NEED FURTHER EVALUATION AND/OR LABORATORY TESTING BEFORE FURTHER REFILLS GIVEN, MAKE APPOINTMENT FOR THIS) (Patient taking differently: Take 50 mg by mouth daily.) 90 tablet 3   No facility-administered medications prior to visit.    No Known Allergies  Review of Systems  Constitutional:  Negative for fever and malaise/fatigue.  HENT:  Negative for congestion.   Eyes:  Negative for blurred vision.  Respiratory:  Negative for shortness of breath.   Cardiovascular:  Negative for chest pain, palpitations and leg swelling.  Gastrointestinal:  Negative for abdominal pain, blood in stool and nausea.  Genitourinary:  Negative for dysuria and frequency.  Musculoskeletal:  Negative for falls.  Skin:  Negative for rash.  Neurological:  Negative for dizziness, loss of consciousness and headaches.  Endo/Heme/Allergies:  Negative for environmental allergies.  Psychiatric/Behavioral:  Negative for depression. The patient is not nervous/anxious.       Objective:    Physical Exam Vitals reviewed.  Constitutional:      Appearance: Normal appearance. He is not ill-appearing.  HENT:     Head: Normocephalic and atraumatic.     Nose: Nose normal.  Eyes:  Conjunctiva/sclera: Conjunctivae normal.  Cardiovascular:     Rate and Rhythm: Normal rate.     Pulses: Normal pulses.     Heart sounds: Normal heart sounds. No murmur heard. Pulmonary:     Effort: Pulmonary effort is normal.     Breath sounds: Normal breath sounds. No wheezing.  Abdominal:     Palpations: Abdomen is soft. There is no mass.     Tenderness: There is no abdominal tenderness.  Musculoskeletal:     Cervical back: Normal range of motion.     Right lower leg: No edema.     Left lower leg: No edema.  Skin:    General: Skin is warm and dry.  Neurological:     General: No focal deficit present.     Mental Status: He is alert and oriented to person, place, and time.  Psychiatric:        Mood and Affect: Mood normal.   BP 138/82 (BP Location: Left Arm, Patient Position: Sitting, Cuff Size: Normal)   Pulse 68   Temp 98.2 F (36.8 C) (Oral)   Resp 16   Ht 5\' 10"  (1.778 m)   Wt 207 lb (93.9 kg)   SpO2 99%   BMI 29.70 kg/m  Wt Readings from Last 3 Encounters:  05/14/23 207 lb (93.9 kg)  05/05/23 199 lb (90.3 kg)  04/06/23 199 lb 12.8 oz (90.6 kg)    Diabetic Foot Exam - Simple   No data filed    Lab Results  Component Value Date   WBC 5.6 03/03/2023   HGB 13.2 03/03/2023   HCT 40.4 03/03/2023   PLT 203 03/03/2023   GLUCOSE 119 (H) 03/03/2023   CHOL 109 11/11/2022   TRIG 69.0 11/11/2022   HDL 46.80 11/11/2022   LDLCALC 49 11/11/2022   ALT 28 03/03/2023   AST 23 03/03/2023   NA 140 03/03/2023   K 3.9 03/03/2023   CL 104 03/03/2023   CREATININE 0.95 03/03/2023   BUN 13 03/03/2023   CO2 26 03/03/2023   TSH 1.16 11/11/2022   PSA 1.08 08/07/2014   HGBA1C 6.2 11/11/2022    Lab Results  Component Value Date   TSH 1.16 11/11/2022   Lab Results  Component Value Date   WBC 5.6 03/03/2023   HGB 13.2 03/03/2023   HCT 40.4 03/03/2023   MCV 86.0 03/03/2023   PLT 203 03/03/2023   Lab Results  Component Value Date   NA 140 03/03/2023   K 3.9  03/03/2023   CO2 26 03/03/2023   GLUCOSE 119 (H) 03/03/2023   BUN 13 03/03/2023   CREATININE 0.95 03/03/2023   BILITOT 0.9 03/03/2023   ALKPHOS 84 03/03/2023   AST 23 03/03/2023   ALT 28 03/03/2023   PROT 7.6 03/03/2023   ALBUMIN 4.1 03/03/2023   CALCIUM 9.7 03/03/2023   ANIONGAP 10 03/03/2023   GFR 67.50 11/11/2022   Lab Results  Component Value Date   CHOL 109 11/11/2022   Lab Results  Component Value Date   HDL 46.80 11/11/2022   Lab Results  Component Value Date   LDLCALC 49 11/11/2022   Lab Results  Component Value Date   TRIG 69.0 11/11/2022   Lab Results  Component Value Date   CHOLHDL 2 11/11/2022   Lab Results  Component Value Date   HGBA1C 6.2 11/11/2022       Assessment & Plan:  Hyperglycemia Assessment & Plan: hgba1c acceptable, minimize simple carbs. Increase exercise as tolerated.   Orders: -  Hemoglobin A1c  Hyperlipidemia, unspecified hyperlipidemia type Assessment & Plan: Tolerating statin, encouraged heart healthy diet, avoid trans fats, minimize simple carbs and saturated fats. Increase exercise as tolerated  Orders: -     Lipid panel  Primary hypertension Assessment & Plan: Well controlled, no changes to meds. Encouraged heart healthy diet such as the DASH diet and exercise as tolerated.    Orders: -     Comprehensive metabolic panel -     CBC with Differential/Platelet -     TSH  Obesity (BMI 30.0-34.9) Assessment & Plan: Encouraged DASH diet, decrease po intake and increase exercise as tolerated. Needs 7-8 hours of sleep nightly. Avoid trans fats, eat small, frequent meals every 4-5 hours with lean proteins, complex carbs and healthy fats. Minimize simple carbs   S/P reverse total shoulder arthroplasty, right Assessment & Plan: Had surgery replaced in September, is still in PT and is improving   Primary osteoarthritis of left knee Assessment & Plan: S/P TKR earlier this year in right knee   Constipation,  unspecified constipation type Assessment & Plan: Uses Miralax and stool softener and keeps the bowels moving. Uses Suppositories and MOM as needed with good results   PTSD (post-traumatic stress disorder)  Anxiety -     ALPRAZolam; TAKE 1 TABLET (1 MG TOTAL) BY MOUTH AT BEDTIME AS NEEDED FOR ANXIETY.  Dispense: 30 tablet; Refill: 1  Sun-damaged skin -     Ambulatory referral to Dermatology  Lesion of lip -     Ambulatory referral to Dermatology  Other orders -     Losartan Potassium; Take 1 tablet (50 mg total) by mouth daily.  Dispense: 90 tablet; Refill: 3    Assessment and Plan    Post-Operative Shoulder and Knee Replacement Recovery ongoing with some residual knee pain. No signs of infection or complications. -Continue current management and physical therapy as needed.  Chronic Constipation Managed with Miralax and stool softeners. No signs of blood or significant changes in bowel habits. -Continue current regimen of Miralax and stool softeners. Maintain hydration and a diet rich in fruits and vegetables.  Hemorrhoids Currently dormant with occasional use of suppositories. No signs of significant bleeding or discomfort. -Continue current management with suppositories as needed.  Skin Lesion on Upper Lip Noticed about a year ago, no significant changes. No signs of malignancy on visual inspection. -Referral to dermatology for further evaluation and possible biopsy.  Anxiety/PTSD Managed with occasional use of Alprazolam. No current desire for additional intervention. -Continue current management with Alprazolam as needed. Open to future consideration of SSRIs if frequency of anxiety episodes increases.  Hypertension Managed with Losartan 50mg  daily. -Continue Losartan 50mg  daily. Refill prescription for 90 days.  General Health Maintenance -Complete blood work today. -Return for follow-up in six months, or sooner if significant changes occur. -Consider use of  Sarna lotion for itch relief related to seborrheic keratoses. -Maintain hydration, especially during hot weather to prevent dehydration. -Continue current vaccination status (up-to-date on COVID, RSV, Flu, Tetanus, Pneumonia, and Shingles vaccines).         Danise Edge, MD

## 2023-05-14 NOTE — Patient Instructions (Addendum)
Sarna anti itch lotion   Preventive Care 65 Years and Older, Male Preventive care refers to lifestyle choices and visits with your health care provider that can promote health and wellness. Preventive care visits are also called wellness exams. What can I expect for my preventive care visit? Counseling During your preventive care visit, your health care provider may ask about your: Medical history, including: Past medical problems. Family medical history. History of falls. Current health, including: Emotional well-being. Home life and relationship well-being. Sexual activity. Memory and ability to understand (cognition). Lifestyle, including: Alcohol, nicotine or tobacco, and drug use. Access to firearms. Diet, exercise, and sleep habits. Work and work Astronomer. Sunscreen use. Safety issues such as seatbelt and bike helmet use. Physical exam Your health care provider will check your: Height and weight. These may be used to calculate your BMI (body mass index). BMI is a measurement that tells if you are at a healthy weight. Waist circumference. This measures the distance around your waistline. This measurement also tells if you are at a healthy weight and may help predict your risk of certain diseases, such as type 2 diabetes and high blood pressure. Heart rate and blood pressure. Body temperature. Skin for abnormal spots. What immunizations do I need?  Vaccines are usually given at various ages, according to a schedule. Your health care provider will recommend vaccines for you based on your age, medical history, and lifestyle or other factors, such as travel or where you work. What tests do I need? Screening Your health care provider may recommend screening tests for certain conditions. This may include: Lipid and cholesterol levels. Diabetes screening. This is done by checking your blood sugar (glucose) after you have not eaten for a while (fasting). Hepatitis C  test. Hepatitis B test. HIV (human immunodeficiency virus) test. STI (sexually transmitted infection) testing, if you are at risk. Lung cancer screening. Colorectal cancer screening. Prostate cancer screening. Abdominal aortic aneurysm (AAA) screening. You may need this if you are a current or former smoker. Talk with your health care provider about your test results, treatment options, and if necessary, the need for more tests. Follow these instructions at home: Eating and drinking  Eat a diet that includes fresh fruits and vegetables, whole grains, lean protein, and low-fat dairy products. Limit your intake of foods with high amounts of sugar, saturated fats, and salt. Take vitamin and mineral supplements as recommended by your health care provider. Do not drink alcohol if your health care provider tells you not to drink. If you drink alcohol: Limit how much you have to 0-2 drinks a day. Know how much alcohol is in your drink. In the [place] one drink equals one 12 oz bottle of beer (355 mL), one 5 oz glass of wine (148 mL), or one 1 oz glass of hard liquor (44 mL). Lifestyle Brush your teeth every morning and night with fluoride toothpaste. Floss one time each day. Exercise for at least 30 minutes 5 or more days each week. Do not use any products that contain nicotine or tobacco. These products include cigarettes, chewing tobacco, and vaping devices, such as e-cigarettes. If you need help quitting, ask your health care provider. Do not use drugs. If you are sexually active, practice safe sex. Use a condom or other form of protection to prevent STIs. Take aspirin only as told by your health care provider. Make sure that you understand how much to take and what form to take. Work with your health care provider to  find out whether it is safe and beneficial for you to take aspirin daily. Ask your health care provider if you need to take a cholesterol-lowering medicine (statin). Find healthy  ways to manage stress, such as: Meditation, yoga, or listening to music. Journaling. Talking to a trusted person. Spending time with friends and family. Safety Always wear your seat belt while driving or riding in a vehicle. Do not drive: If you have been drinking alcohol. Do not ride with someone who has been drinking. When you are tired or distracted. While texting. If you have been using any mind-altering substances or drugs. Wear a helmet and other protective equipment during sports activities. If you have firearms in your house, make sure you follow all gun safety procedures. Minimize exposure to UV radiation to reduce your risk of skin cancer. What's next? Visit your health care provider once a year for an annual wellness visit. Ask your health care provider how often you should have your eyes and teeth checked. Stay up to date on all vaccines. This information is not intended to replace advice given to you by your health care provider. Make sure you discuss any questions you have with your health care provider. Document Revised: 12/12/2020 Document Reviewed: 12/12/2020 Elsevier Patient Education  2024 [employer].

## 2023-05-14 NOTE — Assessment & Plan Note (Signed)
Uses Miralax and stool softener and keeps the bowels moving. Uses Suppositories and MOM as needed with good results

## 2023-05-15 LAB — COMPREHENSIVE METABOLIC PANEL
ALT: 20 U/L (ref 0–53)
AST: 22 U/L (ref 0–37)
Albumin: 4.3 g/dL (ref 3.5–5.2)
Alkaline Phosphatase: 96 U/L (ref 39–117)
BUN: 15 mg/dL (ref 6–23)
CO2: 28 meq/L (ref 19–32)
Calcium: 9.4 mg/dL (ref 8.4–10.5)
Chloride: 106 meq/L (ref 96–112)
Creatinine, Ser: 0.99 mg/dL (ref 0.40–1.50)
GFR: 74.67 mL/min (ref >60.00)
Glucose, Bld: 93 mg/dL (ref 70–99)
Potassium: 4 meq/L (ref 3.5–5.1)
Sodium: 141 meq/L (ref 135–145)
Total Bilirubin: 0.6 mg/dL (ref 0.2–1.2)
Total Protein: 7.1 g/dL (ref 6.0–8.3)

## 2023-05-15 LAB — LIPID PANEL
Cholesterol: 110 mg/dL (ref 0–200)
HDL: 44.2 mg/dL (ref >39.00)
LDL Cholesterol: 48 mg/dL (ref 0–99)
NonHDL: 66.25
Total CHOL/HDL Ratio: 2
Triglycerides: 92 mg/dL (ref 0.0–149.0)
VLDL: 18.4 mg/dL (ref 0.0–40.0)

## 2023-05-15 LAB — CBC WITH DIFFERENTIAL/PLATELET
Basophils Absolute: 0.1 10*3/uL (ref 0.0–0.1)
Basophils Relative: 1 % (ref 0.0–3.0)
Eosinophils Absolute: 0.4 10*3/uL (ref 0.0–0.7)
Eosinophils Relative: 6.9 % — ABNORMAL HIGH (ref 0.0–5.0)
HCT: 38.4 % — ABNORMAL LOW (ref 39.0–52.0)
Hemoglobin: 12.4 g/dL — ABNORMAL LOW (ref 13.0–17.0)
Lymphocytes Relative: 36.2 % (ref 12.0–46.0)
Lymphs Abs: 1.9 10*3/uL (ref 0.7–4.0)
MCHC: 32.2 g/dL (ref 30.0–36.0)
MCV: 87.4 fL (ref 78.0–100.0)
Monocytes Absolute: 0.6 10*3/uL (ref 0.1–1.0)
Monocytes Relative: 12.2 % — ABNORMAL HIGH (ref 3.0–12.0)
Neutro Abs: 2.3 10*3/uL (ref 1.4–7.7)
Neutrophils Relative %: 43.7 % (ref 43.0–77.0)
Platelets: 211 10*3/uL (ref 150.0–400.0)
RBC: 4.39 Mil/uL (ref 4.22–5.81)
RDW: 13.8 % (ref 11.5–15.5)
WBC: 5.2 10*3/uL (ref 4.0–10.5)

## 2023-05-15 LAB — TSH: TSH: 0.83 u[IU]/mL (ref 0.35–5.50)

## 2023-05-15 LAB — HEMOGLOBIN A1C: Hgb A1c MFr Bld: 6 % (ref 4.6–6.5)

## 2023-05-19 ENCOUNTER — Encounter: Payer: Self-pay | Admitting: Physical Therapy

## 2023-05-19 ENCOUNTER — Ambulatory Visit: Payer: Medicare Other | Admitting: Physical Therapy

## 2023-05-19 DIAGNOSIS — M25511 Pain in right shoulder: Secondary | ICD-10-CM

## 2023-05-19 DIAGNOSIS — M6281 Muscle weakness (generalized): Secondary | ICD-10-CM

## 2023-05-19 DIAGNOSIS — M25611 Stiffness of right shoulder, not elsewhere classified: Secondary | ICD-10-CM | POA: Diagnosis not present

## 2023-05-19 DIAGNOSIS — R293 Abnormal posture: Secondary | ICD-10-CM | POA: Diagnosis not present

## 2023-05-19 NOTE — Therapy (Signed)
OUTPATIENT PHYSICAL THERAPY TREATMENT    Patient Name: [name] MRN: [id number] DOB:09-22-1945, 77 y.o., male Today's Date: 05/19/2023   END OF SESSION:  PT End of Session - 05/19/23 0933     Visit Number 14    Date for PT Re-Evaluation 05/25/23    Authorization Type Medicare & Tricare    Progress Note Due on Visit 20    PT Start Time 0933    PT Stop Time 1019    PT Time Calculation (min) 46 min    Activity Tolerance Patient tolerated treatment well    Behavior During Therapy [facility] for tasks assessed/performed                       Past Medical History:  Diagnosis Date   Abnormal CT scan, gastrointestinal tract suspect angioedema    Anemia 03/16/2016   IRON   Anxiety    Arthritis    Back pain 10/10/2012   Fatty liver 2005   seen on 2005 ultrasound,05/2015 CT.    Hemorrhoids 08/22/2016   Hemorrhoids, internal, with bleeding 2005   internal and external. 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    ENTEROSCOPY N/A 10/05/2017   Procedure: ENTEROSCOPY;  Surgeon: Iva Boop, MD;  Location: [facility] ENDOSCOPY;  Service: Endoscopy;  Laterality: N/A;   FRACTURE SURGERY Left 1989   leg   HEMORRHOID BANDING  2005   IR RADIOLOGIST EVAL & MGMT  11/11/2021    KNEE SURGERY Left 2010   arthroscopy, torn carilage   KNEE SURGERY Right 2012   arthroscopy   REVERSE SHOULDER ARTHROPLASTY Right 03/12/2023   Procedure: REVERSE SHOULDER ARTHROPLASTY;  Surgeon: Cammy Copa, MD;  Location: [facility] OR;  Service: Orthopedics;  Laterality: Right;   TONSILLECTOMY  1957   TOTAL KNEE ARTHROPLASTY Right 07/24/2022   Procedure: RIGHT TOTAL KNEE ARTHROPLASTY;  Surgeon: Cammy Copa, MD;  Location: [facility] OR;  Service: Orthopedics;  Laterality: Right;   TOTAL SHOULDER ARTHROPLASTY Left 11/01/2020   Procedure: LEFT ANATOMIC SHOULDER REPLACEMENT;  Surgeon: Cammy Copa, MD;  Location: [facility] OR;  Service: Orthopedics;  Laterality: Left;   VENTRAL HERNIA REPAIR N/A 06/03/2018   Procedure: LAPAROSCOPIC VENTRAL WALL  HERNIA  REPAIR WITH MESH  ERAS PATHWAY;  Surgeon: Karie Soda, MD;  Location: [facility] ORS;  Service: General;  Laterality: N/A;   Patient Active Problem List   Diagnosis Date Noted   Constipation 05/14/2023   PTSD (post-traumatic stress disorder) 05/14/2023   Primary osteoarthritis of right shoulder 03/14/2023   Biceps tendonitis on right 03/14/2023   Shoulder fracture 03/12/2023   S/P reverse total shoulder arthroplasty, right 03/12/2023   Arthritis of right knee 07/27/2022  S/P total knee arthroplasty, right 07/24/2022   Shoulder arthritis    S/P shoulder replacement, 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   Skin tags, multiple acquired 07/07/2019   LUQ pain 07/07/2019   OA (osteoarthritis) of knee 03/16/2019   Ventral hernia s/p lap repair with mesh 06/03/2018 06/03/2018   Hemorrhoids 08/22/2016   Anemia 03/16/2016   Right shoulder pain 02/26/2016   Abnormal CT scan, gastrointestinal tract suspect angioedema    Abdominal pain    Hyperglycemia 08/21/2013   Low back pain 10/10/2012   Hyperlipidemia    Hypertension, essential     Obesity (BMI 30.0-34.9)     PCP: Bradd Canary, MD   REFERRING PROVIDER: Cammy Copa, MD   REFERRING DIAG:  [phone]  (ICD-10-CM) - S/P reverse total shoulder arthroplasty, right  M19.011 (ICD-10-CM) - Primary osteoarthritis of right shoulder  M75.21 (ICD-10-CM) - Biceps tendonitis on right   THERAPY DIAG:  Stiffness of right shoulder, not elsewhere classified  Acute pain of right shoulder  Abnormal posture  Muscle weakness (generalized)  RATIONALE FOR EVALUATION AND TREATMENT: Rehabilitation  ONSET DATE: 03/12/23 - R reverse TSA + biceps tenodesis  NEXT MD VISIT: 06/05/23   SUBJECTIVE:                                                                                                                                                                                                         SUBJECTIVE STATEMENT: [name] still notes more pain at night typically, but not too bad during the day.  EVAL: Pt reports the PA released him from the sling as of his postop follow-up on Friday 03/27/23. He states he had the CPM at home 3x/day x 1.5 hrs for the first 2 weeks post, but now discontinued. Biggest concerns currently with opening jars and feeding himself with R hand. Still sleeping in the recliner as the bed is too uncomfortable. He has been completing distal UE HEP exercises 3x/day.  PAIN: Are you having pain? Yes: NPRS scale: 2-3/10 Pain location: R anterior shoulder  Pain description: sore, tightness Aggravating factors: raising his arm too high, working with the mouse on the computer Relieving factors: oxycodone, gabapentin and muscle relaxant, ice  PERTINENT HISTORY:  R TKA 07/23/22, L TSA 11/01/20, anxiety, arthritis, back pain, osteoporosis, prediabetes, ventral hernia repair  PRECAUTIONS: Shoulder  RED FLAGS: None  HAND DOMINANCE: Right  WEIGHT BEARING RESTRICTIONS: Yes NWB R UE  FALLS:  Has patient fallen in last 6 months? No  LIVING ENVIRONMENT: Lives with: lives with their spouse Lives in: House/apartment Stairs: Yes: Internal: 12 steps; on  right going up and External: 2 steps;  none Has following equipment at home: Single point cane and Walker - 2 wheeled  OCCUPATION: Retired  PLOF: Independent and Leisure: Golf, yard work, gym - bike    PATIENT GOALS: "Better ROM and less pain."   OBJECTIVE: (objective measures completed at initial evaluation unless otherwise dated)  DIAGNOSTIC FINDINGS:  03/12/23 - DG Right shoulder - IMPRESSION: Interval reverse right shoulder arthroplasty without evidence ofhardware failure or loosening.  PATIENT SURVEYS:  Quick Dash 70.5 / 100 = 70.5 %  COGNITION: Overall cognitive status: Within functional limits for tasks assessed     SENSATION: WFL Pt reports intermittent gnawing "toothache" feeling in his L thumb, tingling has subsided  POSTURE: rounded shoulders and forward head  UPPER EXTREMITY ROM:   Passive ROM Right eval R 04/10/23 R 04/23/23  Shoulder flexion 100 120 120  Shoulder extension     Shoulder abduction 88 108 110  Shoulder adduction     Shoulder internal rotation     Shoulder external rotation 15 23 23   (Blank rows = not tested)  AROM assessment deferred on eval due to patient only 2 weeks postop Active ROM Right  supine 04/30/23 R 05/14/23  Shoulder flexion 140 125  Shoulder extension    Shoulder abduction 110 120  Shoulder adduction    Shoulder internal rotation    Shoulder external rotation 15 26  Elbow flexion    Elbow extension    Wrist flexion    Wrist extension    Wrist ulnar deviation    Wrist radial deviation    Wrist pronation    Wrist supination    (Blank rows = not tested)  UPPER EXTREMITY MMT: (Deferred on eval due to only 2 weeks postop)  MMT Right 05/19/23 Left 05/19/23  Shoulder flexion 5 5+  Shoulder extension 4+ 4+  Shoulder abduction 5 5  Shoulder adduction    Shoulder internal rotation 4+ 4+  Shoulder external rotation 4 4+  Middle trapezius    Lower trapezius    Elbow flexion    Elbow extension    Wrist flexion    Wrist extension    Wrist ulnar  deviation    Wrist radial deviation    Wrist pronation    Wrist supination    Grip strength (lbs)    (Blank rows = not tested)  PALPATION:  Mild TTP R pecs, biceps, teres group    TODAY'S TREATMENT:   05/19/23 THERAPEUTIC EXERCISE: to improve flexibility, strength and mobility.  Demonstration, verbal and tactile cues throughout for technique. Pulleys - 3' each flexion and scaption in pain free ROM S/L R shoulder ER 1# - 2 x 10 S/L R shoulder abduction 1# - 2 x 10 S/L R shoulder flexion 1# - 2 x 10 Standing GTB scap retraction + shoulder rows 2 x 10 Standing GTB scap retraction + B shoulder extension 2 x 10 Standing YTB scap retraction + R shoulder ER 2 x 10 Standing R shoulder flexion 1# 2 x 10 Standing R shoulder scaption 1# 2 x 10 UBE - L1.0 x 6 min (3' fwd & back)  THERAPEUTIC ACTIVITIES: UE MMT   05/14/23 THERAPEUTIC EXERCISE: to improve flexibility, strength and mobility.  Demonstration, verbal and tactile cues throughout for technique. Pulleys - 3' each flexion and scaption in pain free ROM AAROM shoulder flexion wall slides 2x12 Measured UE ROM Standing rows YTB 2x10 Standing shoulder extension x 15 YTB Supine R shoulder protraction x 10 2lb Supine R CW/CCW  circles 2x10 2#  Supine shoulder flexion AAROM with wand 10x5"   05/12/23 THERAPEUTIC EXERCISE: to improve flexibility, strength and mobility.  Demonstration, verbal and tactile cues throughout for technique. Pulleys - 3' each flexion and scaption in pain free ROM Supine shoulder flexion hands clasped together 2 x 10  Supine R shoulder "hitchhiker" scaption AROM 2 x 10 Supine R shoulder protraction 2x10 1# db Supine R shoulder CW/CCW circles at 90 flexion  2 x 10 S/L R shoulder abduction  MANUAL THERAPY: To promote normalized muscle tension, improved flexibility, improved joint mobility, increased ROM, and reduced pain. R shoulder flexion, scaption and ER PROM within limits of protocol and pain tolerance   STM/DTM and manual TPR to R biceps, teres group, LS & UT   PATIENT EDUCATION:  Education details: HEP review and HEP progression  Person educated: Patient Education method: Programmer, multimedia, Demonstration, Verbal cues, and Handouts Education comprehension: verbalized understanding, returned demonstration, verbal cues required, and needs further education  HOME EXERCISE PROGRAM: Access Code: [email or web address] URL: [email or web address] Date: 05/19/2023 Prepared by: Glenetta Hew  Exercises - Supine Shoulder Flexion AAROM with Dowel  - 1-2 x daily - 7 x weekly - 2 sets - 10 reps - 3 sec hold - Supine Shoulder Scaption with Dowel  - 1-2 x daily - 7 x weekly - 2 sets - 10 reps - 3 sec hold - Supine Shoulder External Rotation with Dowel at 20-30 Degrees of Abduction  - 1-2 x daily - 7 x weekly - 2 sets - 10 reps - 3 sec hold - Sidelying Shoulder External Rotation (Mirrored)  - 1 x daily - 3 x weekly - 2 sets - 10 reps - Sidelying Shoulder Abduction Palm Forward  - 1 x daily - 3 x weekly - 2 sets - 10 reps - Sidelying Shoulder Flexion 15 Degrees (Mirrored)  - 1 x daily - 3 x weekly - 2 sets - 10 reps - 3 sec hold - Standing Wall [employer] with Mini Swiss Ball  - 1 x daily - 3-4 x weekly - 1 sets - 10 reps - Standing Wall [employer] in Scaption with Mini Swiss Ball  - 1 x daily - 3-4 x weekly - 1 sets - 10 reps - Shoulder extension with resistance - Neutral  - 1 x daily - 7 x weekly - 2 sets - 10 reps - Standing Shoulder Row with Anchored Resistance  - 1 x daily - 7 x weekly - 2 sets - 10 reps - Shoulder External Rotation with Anchored Resistance (Mirrored)  - 1 x daily - 3 x weekly - 1-2 sets - 10 reps - 3 sec hold - Single Arm Shoulder Flexion with Dumbbell (Mirrored)  - 1 x daily - 3 x weekly - 2 sets - 10 reps - 3 sec hold - Single Arm Scaption with Dumbbell (Mirrored)  - 1 x daily - 3 x weekly - 2 sets - 10 reps - 3 sec hold   ASSESSMENT:  CLINICAL IMPRESSION: [name] reports  improving functional use of his R UE with no further limitations noted. He demonstrates good functional R shoulder ROM and strength. He reports no issues with recent HEP update and denies need for review. We continued to progress strengthening per the protocol with good tolerance for all exercises, therefore HEP updated accordingly.  [name] has 1 remaining visit in his current certification and should be ready to transition to his HEP +/- 30-day hold following final goal assessment and addressing of any questions  with HEP.  OBJECTIVE IMPAIRMENTS: decreased activity tolerance, decreased coordination, decreased knowledge of condition, decreased ROM, decreased strength, increased fascial restrictions, impaired perceived functional ability, increased muscle spasms, impaired flexibility, impaired sensation, impaired UE functional use, postural dysfunction, and pain.   ACTIVITY LIMITATIONS: carrying, lifting, sleeping, bathing, dressing, reach over head, hygiene/grooming, and caring for others  PARTICIPATION LIMITATIONS: meal prep, cleaning, laundry, driving, shopping, community activity, and yard work  PERSONAL FACTORS: Past/current experiences, Time since onset of injury/illness/exacerbation, and 3+ comorbidities: R TKA 07/23/22, L TSA 11/01/20, anxiety, arthritis, back pain, osteoporosis, prediabetes, ventral hernia repair  are also affecting patient's functional outcome.   REHAB POTENTIAL: Good  CLINICAL DECISION MAKING: Stable/uncomplicated  EVALUATION COMPLEXITY: Low   GOALS: Goals reviewed with patient? Yes  SHORT TERM GOALS: Target date: 04/27/2023  Patient will be independent with initial HEP to improve outcomes and carryover.  Baseline:  Goal status: MET - 04/10/23  2.  Patient will report 25% improvement in R shoulder pain to improve tolerance for exercise and activity.  Baseline: 6/10 Goal status: MET - 04/10/23 - Pain 3-4/10  LONG TERM GOALS: Target date: 05/25/2023  Patient will  be independent with ongoing/advanced HEP for self-management at home.  Baseline:  Goal status: IN PROGRESS - 05/19/23 - met for current HEP, updated today  2.  Patient will report 75% improvement in R shoulder pain to improve QOL.  Baseline: 6/10 Goal status: MET - 05/19/23 - 70-80% improvement  3.  Patient to demonstrate improved upright posture with posterior shoulder girdle engaged to promote improved glenohumeral joint mobility. Baseline: Forward head and rounded shoulder posture Goal status: MET - 05/19/23  4.  Patient to improve R shoulder AROM to [place] without pain provocation to allow for increased ease of ADLs.  Baseline: Refer to above UE ROM table Goal status: IN PROGRESS - 05/14/23 - see chart  5.  Patient will demonstrate improved R shoulder strength to >/= 4 to 4+/5 for functional UE use. Baseline: Refer to above UE MMT table Goal status: MET - 05/19/23   6  Patient will report </= 60% on QuickDASH to demonstrate improved functional ability (MDIC = 10).  Baseline: 70.5 / 100 = 70.5 % Goal status: IN PROGRESS  7.  Patient to report ability to perform ADLs, household, and leisure-related tasks without limitation due to R shoulder pain, LOM or weakness.   Baseline: Limited with all activities requiring use of R arm Goal status: MET - 05/19/23    PLAN:  PT FREQUENCY: 2x/week  PT DURATION: 8 weeks  PLANNED INTERVENTIONS: Therapeutic exercises, Therapeutic activity, Neuromuscular re-education, Patient/Family education, Self Care, Joint mobilization, Dry Needling, Electrical stimulation, Cryotherapy, Moist heat, Taping, Vasopneumatic device, Ultrasound, Ionotophoresis 4mg /ml Dexamethasone, Manual therapy, and Re-evaluation  PLAN FOR NEXT SESSION: HEP review as indicated; goal assessment; anticipate transition to HEP +/- 30-day hold   Marry Guan, PT 05/19/2023, 5:24 PM

## 2023-05-21 ENCOUNTER — Ambulatory Visit: Payer: Medicare Other

## 2023-05-21 DIAGNOSIS — M6281 Muscle weakness (generalized): Secondary | ICD-10-CM | POA: Diagnosis not present

## 2023-05-21 DIAGNOSIS — R293 Abnormal posture: Secondary | ICD-10-CM | POA: Diagnosis not present

## 2023-05-21 DIAGNOSIS — M25511 Pain in right shoulder: Secondary | ICD-10-CM | POA: Diagnosis not present

## 2023-05-21 DIAGNOSIS — M25611 Stiffness of right shoulder, not elsewhere classified: Secondary | ICD-10-CM

## 2023-05-21 NOTE — Therapy (Signed)
OUTPATIENT PHYSICAL THERAPY TREATMENT    Patient Name: [name] MRN: [id number] DOB:[birth year], 77 y.o., male Today's Date: 05/21/2023   END OF SESSION:  PT End of Session - 05/21/23 1146     Visit Number 15    Date for PT Re-Evaluation 05/25/23    Authorization Type Medicare & Tricare    Progress Note Due on Visit 20    PT Start Time 1018    PT Stop Time 1100    PT Time Calculation (min) 42 min    Activity Tolerance Patient tolerated treatment well    Behavior During Therapy [facility] for tasks assessed/performed                        Past Medical History:  Diagnosis Date   Abnormal CT scan, gastrointestinal tract suspect angioedema    Anemia 03/16/2016   IRON   Anxiety    Arthritis    Back pain 10/10/2012   Fatty liver 2005   seen on 2005 ultrasound,05/2015 CT.    Hemorrhoids 08/22/2016   Hemorrhoids, internal, with bleeding 2005   internal and external. 2005 band ligation (dr Noe Gens in [facility])   Hyperglycemia 08/21/2013   Hyperlipidemia    Hypertension    Nocturia 08/21/2013   Obesity (BMI 30.0-34.9)    Osteoporosis    Pneumonia    as a child   Pre-diabetes    Rectal bleeding 07/30/2014   Renal cyst    Left Kidney   SBO (small bowel obstruction) (HCC)    Tubular adenoma of colon before 2005, 2012, 08/2014   Unspecified constipation 02/06/2014   Vitamin D deficiency    Past Surgical History:  Procedure Laterality Date   BICEPT TENODESIS Right 03/12/2023   Procedure: BICEPS TENODESIS;  Surgeon: Cammy Copa, MD;  Location: [facility] OR;  Service: Orthopedics;  Laterality: Right;   CATARACT EXTRACTION W/ INTRAOCULAR LENS IMPLANT Bilateral    COLONOSCOPY  before 2005, 2012, 2013, 08/2014.    ENTEROSCOPY N/A 10/05/2017   Procedure: ENTEROSCOPY;  Surgeon: Iva Boop, MD;  Location: [facility] ENDOSCOPY;  Service: Endoscopy;  Laterality: N/A;   FRACTURE SURGERY Left 1989   leg   HEMORRHOID BANDING  2005   IR RADIOLOGIST EVAL & MGMT  11/11/2021    KNEE SURGERY Left 2010   arthroscopy, torn carilage   KNEE SURGERY Right 2012   arthroscopy   REVERSE SHOULDER ARTHROPLASTY Right 03/12/2023   Procedure: REVERSE SHOULDER ARTHROPLASTY;  Surgeon: Cammy Copa, MD;  Location: [facility] OR;  Service: Orthopedics;  Laterality: Right;   TONSILLECTOMY  1957   TOTAL KNEE ARTHROPLASTY Right 07/24/2022   Procedure: RIGHT TOTAL KNEE ARTHROPLASTY;  Surgeon: Cammy Copa, MD;  Location: [facility] OR;  Service: Orthopedics;  Laterality: Right;   TOTAL SHOULDER ARTHROPLASTY Left 11/01/2020   Procedure: LEFT ANATOMIC SHOULDER REPLACEMENT;  Surgeon: Cammy Copa, MD;  Location: [facility] OR;  Service: Orthopedics;  Laterality: Left;   VENTRAL HERNIA REPAIR N/A 06/03/2018   Procedure: LAPAROSCOPIC VENTRAL WALL  HERNIA  REPAIR WITH MESH  ERAS PATHWAY;  Surgeon: Karie Soda, MD;  Location: [facility] ORS;  Service: General;  Laterality: N/A;   Patient Active Problem List   Diagnosis Date Noted   Constipation 05/14/2023   PTSD (post-traumatic stress disorder) 05/14/2023   Primary osteoarthritis of right shoulder 03/14/2023   Biceps tendonitis on right 03/14/2023   Shoulder fracture 03/12/2023   S/P reverse total shoulder arthroplasty, right 03/12/2023   Arthritis of right knee 07/27/2022  S/P total knee arthroplasty, right 07/24/2022   Shoulder arthritis    S/P shoulder replacement, 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   Skin tags, multiple acquired 07/07/2019   LUQ pain 07/07/2019   OA (osteoarthritis) of knee 03/16/2019   Ventral hernia s/p lap repair with mesh 06/03/2018 06/03/2018   Hemorrhoids 08/22/2016   Anemia 03/16/2016   Right shoulder pain 02/26/2016   Abnormal CT scan, gastrointestinal tract suspect angioedema    Abdominal pain    Hyperglycemia 08/21/2013   Low back pain 10/10/2012   Hyperlipidemia    Hypertension, essential     Obesity (BMI 30.0-34.9)     PCP: Bradd Canary, MD   REFERRING PROVIDER: Cammy Copa, MD   REFERRING DIAG:  [phone]  (ICD-10-CM) - S/P reverse total shoulder arthroplasty, right  M19.011 (ICD-10-CM) - Primary osteoarthritis of right shoulder  M75.21 (ICD-10-CM) - Biceps tendonitis on right   THERAPY DIAG:  Stiffness of right shoulder, not elsewhere classified  Acute pain of right shoulder  Abnormal posture  Muscle weakness (generalized)  RATIONALE FOR EVALUATION AND TREATMENT: Rehabilitation  ONSET DATE: 03/12/23 - R reverse TSA + biceps tenodesis  NEXT MD VISIT: 06/05/23   SUBJECTIVE:                                                                                                                                                                                                         SUBJECTIVE STATEMENT: No changes today   EVAL: Pt reports the PA released him from the sling as of his postop follow-up on Friday 03/27/23. He states he had the CPM at home 3x/day x 1.5 hrs for the first 2 weeks post, but now discontinued. Biggest concerns currently with opening jars and feeding himself with R hand. Still sleeping in the recliner as the bed is too uncomfortable. He has been completing distal UE HEP exercises 3x/day.  PAIN: Are you having pain? Yes: NPRS scale: 2-3/10 Pain location: R anterior shoulder  Pain description: sore, tightness Aggravating factors: raising his arm too high, working with the mouse on the computer Relieving factors: oxycodone, gabapentin and muscle relaxant, ice  PERTINENT HISTORY:  R TKA 07/23/22, L TSA 11/01/20, anxiety, arthritis, back pain, osteoporosis, prediabetes, ventral hernia repair  PRECAUTIONS: Shoulder  RED FLAGS: None  HAND DOMINANCE: Right  WEIGHT BEARING RESTRICTIONS: Yes NWB R UE  FALLS:  Has patient fallen in last 6 months? No  LIVING ENVIRONMENT: Lives with: lives with their spouse Lives in: House/apartment Stairs: Yes: Internal: 12 steps; on right going up and External: 2 steps; none Has following equipment  at home: Single point cane and Walker - 2  wheeled  OCCUPATION: Retired  PLOF: Independent and Leisure: Golf, yard work, gym - bike    PATIENT GOALS: "Better ROM and less pain."   OBJECTIVE: (objective measures completed at initial evaluation unless otherwise dated)  DIAGNOSTIC FINDINGS:  03/12/23 - DG Right shoulder - IMPRESSION: Interval reverse right shoulder arthroplasty without evidence ofhardware failure or loosening.  PATIENT SURVEYS:  Quick Dash 70.5 / 100 = 70.5 %  COGNITION: Overall cognitive status: Within functional limits for tasks assessed     SENSATION: WFL Pt reports intermittent gnawing "toothache" feeling in his L thumb, tingling has subsided  POSTURE: rounded shoulders and forward head  UPPER EXTREMITY ROM:   Passive ROM Right eval R 04/10/23 R 04/23/23  Shoulder flexion 100 120 120  Shoulder extension     Shoulder abduction 88 108 110  Shoulder adduction     Shoulder internal rotation     Shoulder external rotation 15 23 23   (Blank rows = not tested)  AROM assessment deferred on eval due to patient only 2 weeks postop Active ROM Right  supine 04/30/23 R 05/14/23  Shoulder flexion 140 125  Shoulder extension    Shoulder abduction 110 120  Shoulder adduction    Shoulder internal rotation    Shoulder external rotation 15 26  Elbow flexion    Elbow extension    Wrist flexion    Wrist extension    Wrist ulnar deviation    Wrist radial deviation    Wrist pronation    Wrist supination    (Blank rows = not tested)  UPPER EXTREMITY MMT: (Deferred on eval due to only 2 weeks postop)  MMT Right 05/19/23 Left 05/19/23  Shoulder flexion 5 5+  Shoulder extension 4+ 4+  Shoulder abduction 5 5  Shoulder adduction    Shoulder internal rotation 4+ 4+  Shoulder external rotation 4 4+  Middle trapezius    Lower trapezius    Elbow flexion    Elbow extension    Wrist flexion    Wrist extension    Wrist ulnar deviation    Wrist radial deviation    Wrist pronation    Wrist supination     Grip strength (lbs)    (Blank rows = not tested)  PALPATION:  Mild TTP R pecs, biceps, teres group    TODAY'S TREATMENT:  05/21/23 THERAPEUTIC EXERCISE: to improve flexibility, strength and mobility.  Demonstration, verbal and tactile cues throughout for technique. UBE - L1.0 x 6 min (3' fwd & back) QuickDASH Score: 22.7 / 100 = 22.7 % Shoulder flexion wall slide 2x12; abduction 2x12 CW/CCW circles on wall 2x10 R shoulder ER YTB 2x10 R shoulder ER 2# x 12 s/l R shoulder abduction 2# x 10 s/l Standing shoulder flexion x 10 Standing shoulder abduction 10   05/19/23 THERAPEUTIC EXERCISE: to improve flexibility, strength and mobility.  Demonstration, verbal and tactile cues throughout for technique. Pulleys - 3' each flexion and scaption in pain free ROM S/L R shoulder ER 1# - 2 x 10 S/L R shoulder abduction 1# - 2 x 10 S/L R shoulder flexion 1# - 2 x 10 Standing GTB scap retraction + shoulder rows 2 x 10 Standing GTB scap retraction + B shoulder extension 2 x 10 Standing YTB scap retraction + R shoulder ER 2 x 10 Standing R shoulder flexion 1# 2 x 10 Standing R shoulder scaption 1# 2 x 10 UBE - L1.0 x 6 min (3' fwd &  back)  THERAPEUTIC ACTIVITIES: UE MMT   05/14/23 THERAPEUTIC EXERCISE: to improve flexibility, strength and mobility.  Demonstration, verbal and tactile cues throughout for technique. Pulleys - 3' each flexion and scaption in pain free ROM AAROM shoulder flexion wall slides 2x12 Measured UE ROM Standing rows YTB 2x10 Standing shoulder extension x 15 YTB Supine R shoulder protraction x 10 2lb Supine R CW/CCW circles 2x10 2#  Supine shoulder flexion AAROM with wand 10x5"   05/12/23 THERAPEUTIC EXERCISE: to improve flexibility, strength and mobility.  Demonstration, verbal and tactile cues throughout for technique. Pulleys - 3' each flexion and scaption in pain free ROM Supine shoulder flexion hands clasped together 2 x 10  Supine R shoulder "hitchhiker"  scaption AROM 2 x 10 Supine R shoulder protraction 2x10 1# db Supine R shoulder CW/CCW circles at 90 flexion  2 x 10 S/L R shoulder abduction  MANUAL THERAPY: To promote normalized muscle tension, improved flexibility, improved joint mobility, increased ROM, and reduced pain. R shoulder flexion, scaption and ER PROM within limits of protocol and pain tolerance  STM/DTM and manual TPR to R biceps, teres group, LS & UT   PATIENT EDUCATION:  Education details: HEP review and HEP progression  Person educated: Patient Education method: Programmer, multimedia, Demonstration, Verbal cues, and Handouts Education comprehension: verbalized understanding, returned demonstration, verbal cues required, and needs further education  HOME EXERCISE PROGRAM: Access Code: [email or web address] URL: [email or web address] Date: 05/19/2023 Prepared by: Glenetta Hew  Exercises - Supine Shoulder Flexion AAROM with Dowel  - 1-2 x daily - 7 x weekly - 2 sets - 10 reps - 3 sec hold - Supine Shoulder Scaption with Dowel  - 1-2 x daily - 7 x weekly - 2 sets - 10 reps - 3 sec hold - Supine Shoulder External Rotation with Dowel at 20-30 Degrees of Abduction  - 1-2 x daily - 7 x weekly - 2 sets - 10 reps - 3 sec hold - Sidelying Shoulder External Rotation (Mirrored)  - 1 x daily - 3 x weekly - 2 sets - 10 reps - Sidelying Shoulder Abduction Palm Forward  - 1 x daily - 3 x weekly - 2 sets - 10 reps - Sidelying Shoulder Flexion 15 Degrees (Mirrored)  - 1 x daily - 3 x weekly - 2 sets - 10 reps - 3 sec hold - Standing Wall [employer] with Mini Swiss Ball  - 1 x daily - 3-4 x weekly - 1 sets - 10 reps - Standing Wall [employer] in Scaption with Mini Swiss Ball  - 1 x daily - 3-4 x weekly - 1 sets - 10 reps - Shoulder extension with resistance - Neutral  - 1 x daily - 7 x weekly - 2 sets - 10 reps - Standing Shoulder Row with Anchored Resistance  - 1 x daily - 7 x weekly - 2 sets - 10 reps - Shoulder External Rotation with  Anchored Resistance (Mirrored)  - 1 x daily - 3 x weekly - 1-2 sets - 10 reps - 3 sec hold - Single Arm Shoulder Flexion with Dumbbell (Mirrored)  - 1 x daily - 3 x weekly - 2 sets - 10 reps - 3 sec hold - Single Arm Scaption with Dumbbell (Mirrored)  - 1 x daily - 3 x weekly - 2 sets - 10 reps - 3 sec hold   ASSESSMENT:  CLINICAL IMPRESSION: Trisha shows good functional use of R shoulder. He has very mild pain. We reviewed the HEP giving clarification  on taking rest days to avoid overuse. Minor cues required with CW/CCW circles on wall to target the shoulder musculature. His goals were re-assesed by PT last visit and he shows good enough progress to do 30 day hold from PT at this time.   OBJECTIVE IMPAIRMENTS: decreased activity tolerance, decreased coordination, decreased knowledge of condition, decreased ROM, decreased strength, increased fascial restrictions, impaired perceived functional ability, increased muscle spasms, impaired flexibility, impaired sensation, impaired UE functional use, postural dysfunction, and pain.   ACTIVITY LIMITATIONS: carrying, lifting, sleeping, bathing, dressing, reach over head, hygiene/grooming, and caring for others  PARTICIPATION LIMITATIONS: meal prep, cleaning, laundry, driving, shopping, community activity, and yard work  PERSONAL FACTORS: Past/current experiences, Time since onset of injury/illness/exacerbation, and 3+ comorbidities: R TKA 07/23/22, L TSA 11/01/20, anxiety, arthritis, back pain, osteoporosis, prediabetes, ventral hernia repair  are also affecting patient's functional outcome.   REHAB POTENTIAL: Good  CLINICAL DECISION MAKING: Stable/uncomplicated  EVALUATION COMPLEXITY: Low   GOALS: Goals reviewed with patient? Yes  SHORT TERM GOALS: Target date: 04/27/2023  Patient will be independent with initial HEP to improve outcomes and carryover.  Baseline:  Goal status: MET - 04/10/23  2.  Patient will report 25% improvement in R  shoulder pain to improve tolerance for exercise and activity.  Baseline: 6/10 Goal status: MET - 04/10/23 - Pain 3-4/10  LONG TERM GOALS: Target date: 05/25/2023  Patient will be independent with ongoing/advanced HEP for self-management at home.  Baseline:  Goal status: IN PROGRESS - 05/19/23 - met for current HEP, updated today  2.  Patient will report 75% improvement in R shoulder pain to improve QOL.  Baseline: 6/10 Goal status: MET - 05/19/23 - 70-80% improvement  3.  Patient to demonstrate improved upright posture with posterior shoulder girdle engaged to promote improved glenohumeral joint mobility. Baseline: Forward head and rounded shoulder posture Goal status: MET - 05/19/23  4.  Patient to improve R shoulder AROM to [facility] without pain provocation to allow for increased ease of ADLs.  Baseline: Refer to above UE ROM table Goal status: IN PROGRESS - 05/14/23 - see chart  5.  Patient will demonstrate improved R shoulder strength to >/= 4 to 4+/5 for functional UE use. Baseline: Refer to above UE MMT table Goal status: MET - 05/19/23   6  Patient will report </= 60% on QuickDASH to demonstrate improved functional ability (MDIC = 10).  Baseline: 70.5 / 100 = 70.5 % Goal status: MET- 05/21/23  7.  Patient to report ability to perform ADLs, household, and leisure-related tasks without limitation due to R shoulder pain, LOM or weakness.   Baseline: Limited with all activities requiring use of R arm Goal status: MET - 05/19/23    PLAN:  PT FREQUENCY: 2x/week  PT DURATION: 8 weeks  PLANNED INTERVENTIONS: Therapeutic exercises, Therapeutic activity, Neuromuscular re-education, Patient/Family education, Self Care, Joint mobilization, Dry Needling, Electrical stimulation, Cryotherapy, Moist heat, Taping, Vasopneumatic device, Ultrasound, Ionotophoresis 4mg /ml Dexamethasone, Manual therapy, and Re-evaluation  PLAN FOR NEXT SESSION: 30 day hold   Lyrik Buresh L Chestine Spore,  PTA 05/21/2023, 12:51 PM

## 2023-05-29 NOTE — Telephone Encounter (Signed)
Ortho bundle call . 

## 2023-06-01 ENCOUNTER — Encounter: Payer: Medicare Other | Admitting: Physical Therapy

## 2023-06-05 ENCOUNTER — Ambulatory Visit: Payer: Medicare Other | Admitting: Orthopedic Surgery

## 2023-06-05 ENCOUNTER — Ambulatory Visit (INDEPENDENT_AMBULATORY_CARE_PROVIDER_SITE_OTHER): Payer: Medicare Other | Admitting: Surgical

## 2023-06-05 ENCOUNTER — Encounter: Payer: Self-pay | Admitting: Surgical

## 2023-06-05 DIAGNOSIS — Z96651 Presence of right artificial knee joint: Secondary | ICD-10-CM

## 2023-06-05 DIAGNOSIS — Z96611 Presence of right artificial shoulder joint: Secondary | ICD-10-CM

## 2023-06-05 NOTE — Progress Notes (Signed)
Post-Op Visit Note   Patient: [name]           Date of Birth: December 30, 1945           MRN: [id number] Visit Date: 06/05/2023 PCP: Bradd Canary, MD   Assessment & Plan:  Chief Complaint:  Chief Complaint  Patient presents with   Right Shoulder - Follow-up    Reverse Shoulder Arthroplasty 03/12/2023   Visit Diagnoses:  1. S/P reverse total shoulder arthroplasty, right   2. S/P total knee arthroplasty, right     Plan: Patient is a 77 year old male who presents s/p right reverse shoulder arthroplasty on 03/12/2023.  He is doing well and states that his right shoulder is getting better and better.  He has finished physical therapy and transition to home exercise program.  Occasionally has to take Advil at night to help with sleeping and cannot lay on his right shoulder yet.  He has progressed to lifting a 3 pound weight up to shoulder level with no significant fatigue with lifting this weight.  He feels very functional with this arm.  On exam, he has 25 degrees X rotation, 80 degrees abduction, 125 degrees forward elevation passively and actively.  Axillary nerve intact with deltoid firing.  Incision is well-healed.  Subscap strength intact.  2+ radial pulse of the operative extremity.  Plan is to follow-up with the office as needed.  He should be good for golfing in April 2025 when he wants to return.  Recommended that he start with putting and chipping in the early months of 2025 and progress from there.  He will follow-up if he has any concerns or continued pain.  Regarding his right knee he does have improvement of pain following aspiration by Dr. August Saucer at his last visit.  He has been doing some exercises focusing on hip abduction and feels that this has been helpful for him with his knee pain.  Does have a trace effusion on exam today.  With his knee pain improving, he will keep an eye on this and let us know if it plateaus or worsens.  Follow-Up Instructions: No follow-ups on file.    Orders:  No orders of the defined types were placed in this encounter.  No orders of the defined types were placed in this encounter.   Imaging: No results found.  PMFS History: Patient Active Problem List   Diagnosis Date Noted   Constipation 05/14/2023   PTSD (post-traumatic stress disorder) 05/14/2023   Primary osteoarthritis of right shoulder 03/14/2023   Biceps tendonitis on right 03/14/2023   Shoulder fracture 03/12/2023   S/P reverse total shoulder arthroplasty, right 03/12/2023   Arthritis of right knee 07/27/2022   S/P total knee arthroplasty, right 07/24/2022   Shoulder arthritis    S/P shoulder replacement, 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   Skin tags, multiple acquired 07/07/2019   LUQ pain 07/07/2019   OA (osteoarthritis) of knee 03/16/2019   Ventral hernia s/p lap repair with mesh 06/03/2018 06/03/2018   Hemorrhoids 08/22/2016   Anemia 03/16/2016   Right shoulder pain 02/26/2016   Abnormal CT scan, gastrointestinal tract suspect angioedema    Abdominal pain    Hyperglycemia 08/21/2013   Low back pain 10/10/2012   Hyperlipidemia    Hypertension, essential     Obesity (BMI 30.0-34.9)    Past Medical History:  Diagnosis Date   Abnormal CT scan, gastrointestinal tract suspect angioedema    Anemia 03/16/2016   IRON   Anxiety    Arthritis  Back pain 10/10/2012   Fatty liver 2005   seen on 2005 ultrasound,05/2015 CT.    Hemorrhoids 08/22/2016   Hemorrhoids, internal, with bleeding 2005   internal and external. 2005 band ligation (dr Noe Gens in [facility])   Hyperglycemia 08/21/2013   Hyperlipidemia    Hypertension    Nocturia 08/21/2013   Obesity (BMI 30.0-34.9)    Osteoporosis    Pneumonia    as a child   Pre-diabetes    Rectal bleeding 07/30/2014   Renal cyst    Left Kidney   SBO (small bowel obstruction) (HCC)    Tubular adenoma of colon before 2005, 2012, 08/2014   Unspecified constipation 02/06/2014   Vitamin D deficiency     Family History  Problem  Relation Age of Onset   Stroke Mother    Aneurysm Mother        brain   Arthritis Father    Liver cancer Father        liver, atomic testing in military   Gallbladder disease Father    Hyperlipidemia Sister    Hypertension Sister    Obesity Sister    Diabetes Sister    Diabetes Brother    Hyperlipidemia Sister    Hypertension Sister    Obesity Sister    Diabetes Sister    Aneurysm Brother        brain   COPD Brother    Gallbladder disease Sister        x4   Colon cancer Neg Hx    Colon polyps Neg Hx    Esophageal cancer Neg Hx    Rectal cancer Neg Hx    Stomach cancer Neg Hx    Prostate cancer Neg Hx    CAD Neg Hx     Past Surgical History:  Procedure Laterality Date   BICEPT TENODESIS Right 03/12/2023   Procedure: BICEPS TENODESIS;  Surgeon: Cammy Copa, MD;  Location: [facility] OR;  Service: Orthopedics;  Laterality: Right;   CATARACT EXTRACTION W/ INTRAOCULAR LENS IMPLANT Bilateral    COLONOSCOPY  before 2005, 2012, 2013, 08/2014.    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   Procedure: REVERSE SHOULDER ARTHROPLASTY;  Surgeon: Cammy Copa, MD;  Location: [facility] OR;  Service: Orthopedics;  Laterality: Right;   TONSILLECTOMY  1957   TOTAL KNEE ARTHROPLASTY Right 07/24/2022   Procedure: RIGHT TOTAL KNEE ARTHROPLASTY;  Surgeon: Cammy Copa, MD;  Location: [facility] OR;  Service: Orthopedics;  Laterality: Right;   TOTAL SHOULDER ARTHROPLASTY Left 11/01/2020   Procedure: LEFT ANATOMIC SHOULDER REPLACEMENT;  Surgeon: Cammy Copa, MD;  Location: [facility] OR;  Service: Orthopedics;  Laterality: Left;   VENTRAL HERNIA  REPAIR N/A 06/03/2018   Procedure: LAPAROSCOPIC VENTRAL WALL  HERNIA  REPAIR WITH MESH  ERAS PATHWAY;  Surgeon: Karie Soda, MD;  Location: [facility] ORS;  Service: General;  Laterality: N/A;   Social History   Occupational History   Occupation: Retired    Associate Professor: [employer]  Tobacco Use   Smoking status: Never   Smokeless tobacco: Never  Vaping Use   Vaping status: Never Used  Substance and Sexual Activity   Alcohol use: Not Currently    Comment: Occassionally   Drug use: No   Sexual activity: Yes

## 2023-07-06 ENCOUNTER — Other Ambulatory Visit: Payer: Self-pay | Admitting: Family Medicine

## 2023-08-11 ENCOUNTER — Ambulatory Visit: Payer: Medicare Other | Admitting: Family

## 2023-08-11 ENCOUNTER — Encounter: Payer: Self-pay | Admitting: Family

## 2023-08-11 ENCOUNTER — Telehealth: Payer: Self-pay | Admitting: Family

## 2023-08-11 ENCOUNTER — Ambulatory Visit (HOSPITAL_BASED_OUTPATIENT_CLINIC_OR_DEPARTMENT_OTHER)
Admission: RE | Admit: 2023-08-11 | Discharge: 2023-08-11 | Disposition: A | Discharge status: Home / Self Care | Payer: Medicare Other | Source: Ambulatory Visit | Attending: Family | Admitting: Family

## 2023-08-11 VITALS — BP 132/79 | HR 101 | Temp 98.9°F | Resp 16 | Ht 70.0 in | Wt 214.0 lb

## 2023-08-11 DIAGNOSIS — K56600 Partial intestinal obstruction, unspecified as to cause: Secondary | ICD-10-CM | POA: Diagnosis not present

## 2023-08-11 DIAGNOSIS — A419 Sepsis, unspecified organism: Secondary | ICD-10-CM | POA: Diagnosis not present

## 2023-08-11 DIAGNOSIS — E876 Hypokalemia: Secondary | ICD-10-CM | POA: Diagnosis not present

## 2023-08-11 DIAGNOSIS — Z833 Family history of diabetes mellitus: Secondary | ICD-10-CM | POA: Diagnosis not present

## 2023-08-11 DIAGNOSIS — Z4682 Encounter for fitting and adjustment of non-vascular catheter: Secondary | ICD-10-CM | POA: Diagnosis not present

## 2023-08-11 DIAGNOSIS — K633 Ulcer of intestine: Secondary | ICD-10-CM | POA: Diagnosis not present

## 2023-08-11 DIAGNOSIS — K5669 Other partial intestinal obstruction: Secondary | ICD-10-CM | POA: Diagnosis not present

## 2023-08-11 DIAGNOSIS — R109 Unspecified abdominal pain: Secondary | ICD-10-CM

## 2023-08-11 DIAGNOSIS — K30 Functional dyspepsia: Secondary | ICD-10-CM | POA: Diagnosis not present

## 2023-08-11 DIAGNOSIS — D649 Anemia, unspecified: Secondary | ICD-10-CM | POA: Diagnosis not present

## 2023-08-11 DIAGNOSIS — R1084 Generalized abdominal pain: Secondary | ICD-10-CM | POA: Diagnosis not present

## 2023-08-11 DIAGNOSIS — K668 Other specified disorders of peritoneum: Secondary | ICD-10-CM | POA: Diagnosis not present

## 2023-08-11 DIAGNOSIS — K689 Other disorders of retroperitoneum: Secondary | ICD-10-CM | POA: Diagnosis not present

## 2023-08-11 DIAGNOSIS — Z8249 Family history of ischemic heart disease and other diseases of the circulatory system: Secondary | ICD-10-CM | POA: Diagnosis not present

## 2023-08-11 DIAGNOSIS — R110 Nausea: Secondary | ICD-10-CM | POA: Diagnosis not present

## 2023-08-11 DIAGNOSIS — E878 Other disorders of electrolyte and fluid balance, not elsewhere classified: Secondary | ICD-10-CM | POA: Diagnosis not present

## 2023-08-11 DIAGNOSIS — K635 Polyp of colon: Secondary | ICD-10-CM | POA: Diagnosis not present

## 2023-08-11 DIAGNOSIS — Z825 Family history of asthma and other chronic lower respiratory diseases: Secondary | ICD-10-CM | POA: Diagnosis not present

## 2023-08-11 DIAGNOSIS — K56699 Other intestinal obstruction unspecified as to partial versus complete obstruction: Secondary | ICD-10-CM | POA: Diagnosis not present

## 2023-08-11 DIAGNOSIS — E66811 Obesity, class 1: Secondary | ICD-10-CM | POA: Diagnosis not present

## 2023-08-11 DIAGNOSIS — R188 Other ascites: Secondary | ICD-10-CM | POA: Diagnosis not present

## 2023-08-11 DIAGNOSIS — K567 Ileus, unspecified: Secondary | ICD-10-CM | POA: Diagnosis not present

## 2023-08-11 DIAGNOSIS — N281 Cyst of kidney, acquired: Secondary | ICD-10-CM | POA: Diagnosis not present

## 2023-08-11 DIAGNOSIS — F419 Anxiety disorder, unspecified: Secondary | ICD-10-CM | POA: Diagnosis not present

## 2023-08-11 DIAGNOSIS — S31109A Unspecified open wound of abdominal wall, unspecified quadrant without penetration into peritoneal cavity, initial encounter: Secondary | ICD-10-CM | POA: Diagnosis not present

## 2023-08-11 DIAGNOSIS — E440 Moderate protein-calorie malnutrition: Secondary | ICD-10-CM | POA: Diagnosis not present

## 2023-08-11 DIAGNOSIS — K56609 Unspecified intestinal obstruction, unspecified as to partial versus complete obstruction: Secondary | ICD-10-CM | POA: Diagnosis not present

## 2023-08-11 DIAGNOSIS — N400 Enlarged prostate without lower urinary tract symptoms: Secondary | ICD-10-CM | POA: Diagnosis not present

## 2023-08-11 DIAGNOSIS — A0811 Acute gastroenteropathy due to Norwalk agent: Secondary | ICD-10-CM | POA: Diagnosis not present

## 2023-08-11 DIAGNOSIS — E785 Hyperlipidemia, unspecified: Secondary | ICD-10-CM | POA: Diagnosis not present

## 2023-08-11 DIAGNOSIS — E871 Hypo-osmolality and hyponatremia: Secondary | ICD-10-CM | POA: Diagnosis not present

## 2023-08-11 DIAGNOSIS — N179 Acute kidney failure, unspecified: Secondary | ICD-10-CM | POA: Diagnosis not present

## 2023-08-11 DIAGNOSIS — D6489 Other specified anemias: Secondary | ICD-10-CM | POA: Diagnosis present

## 2023-08-11 DIAGNOSIS — Z96612 Presence of left artificial shoulder joint: Secondary | ICD-10-CM | POA: Diagnosis present

## 2023-08-11 DIAGNOSIS — Z96651 Presence of right artificial knee joint: Secondary | ICD-10-CM | POA: Diagnosis present

## 2023-08-11 DIAGNOSIS — E8809 Other disorders of plasma-protein metabolism, not elsewhere classified: Secondary | ICD-10-CM | POA: Diagnosis not present

## 2023-08-11 DIAGNOSIS — K658 Other peritonitis: Secondary | ICD-10-CM | POA: Diagnosis not present

## 2023-08-11 DIAGNOSIS — Z96611 Presence of right artificial shoulder joint: Secondary | ICD-10-CM | POA: Diagnosis present

## 2023-08-11 DIAGNOSIS — K5000 Crohn's disease of small intestine without complications: Secondary | ICD-10-CM | POA: Diagnosis not present

## 2023-08-11 DIAGNOSIS — D62 Acute posthemorrhagic anemia: Secondary | ICD-10-CM | POA: Diagnosis not present

## 2023-08-11 DIAGNOSIS — I10 Essential (primary) hypertension: Secondary | ICD-10-CM | POA: Diagnosis not present

## 2023-08-11 DIAGNOSIS — R140 Abdominal distension (gaseous): Secondary | ICD-10-CM | POA: Diagnosis not present

## 2023-08-11 DIAGNOSIS — R197 Diarrhea, unspecified: Secondary | ICD-10-CM | POA: Diagnosis not present

## 2023-08-11 DIAGNOSIS — R933 Abnormal findings on diagnostic imaging of other parts of digestive tract: Secondary | ICD-10-CM | POA: Diagnosis not present

## 2023-08-11 DIAGNOSIS — K6389 Other specified diseases of intestine: Secondary | ICD-10-CM | POA: Diagnosis not present

## 2023-08-11 DIAGNOSIS — Z79899 Other long term (current) drug therapy: Secondary | ICD-10-CM | POA: Diagnosis not present

## 2023-08-11 DIAGNOSIS — K5730 Diverticulosis of large intestine without perforation or abscess without bleeding: Secondary | ICD-10-CM | POA: Diagnosis not present

## 2023-08-11 DIAGNOSIS — K631 Perforation of intestine (nontraumatic): Secondary | ICD-10-CM | POA: Diagnosis not present

## 2023-08-11 DIAGNOSIS — J189 Pneumonia, unspecified organism: Secondary | ICD-10-CM | POA: Diagnosis not present

## 2023-08-11 DIAGNOSIS — R112 Nausea with vomiting, unspecified: Secondary | ICD-10-CM | POA: Diagnosis not present

## 2023-08-11 DIAGNOSIS — N289 Disorder of kidney and ureter, unspecified: Secondary | ICD-10-CM | POA: Diagnosis not present

## 2023-08-11 LAB — CBC WITH DIFFERENTIAL/PLATELET
Basophils Absolute: 0 10*3/uL (ref 0.0–0.1)
Basophils Relative: 0 % (ref 0.0–3.0)
Eosinophils Absolute: 0 10*3/uL (ref 0.0–0.7)
Eosinophils Relative: 0 % (ref 0.0–5.0)
HCT: 42.7 % (ref 39.0–52.0)
Hemoglobin: 14.1 g/dL (ref 13.0–17.0)
Lymphocytes Relative: 9.4 % — ABNORMAL LOW (ref 12.0–46.0)
Lymphs Abs: 0.5 10*3/uL — ABNORMAL LOW (ref 0.7–4.0)
MCHC: 33 g/dL (ref 30.0–36.0)
MCV: 86.7 fL (ref 78.0–100.0)
Monocytes Absolute: 0.5 10*3/uL (ref 0.1–1.0)
Monocytes Relative: 9.5 % (ref 3.0–12.0)
Neutro Abs: 4.6 10*3/uL (ref 1.4–7.7)
Neutrophils Relative %: 81.1 % — ABNORMAL HIGH (ref 43.0–77.0)
Platelets: 175 10*3/uL (ref 150.0–400.0)
RBC: 4.92 Mil/uL (ref 4.22–5.81)
RDW: 14.5 % (ref 11.5–15.5)
WBC: 5.6 10*3/uL (ref 4.0–10.5)

## 2023-08-11 LAB — COMPREHENSIVE METABOLIC PANEL
ALT: 21 U/L (ref 0–53)
AST: 20 U/L (ref 0–37)
Albumin: 4.3 g/dL (ref 3.5–5.2)
Alkaline Phosphatase: 66 U/L (ref 39–117)
BUN: 16 mg/dL (ref 6–23)
CO2: 25 meq/L (ref 19–32)
Calcium: 8.8 mg/dL (ref 8.4–10.5)
Chloride: 100 meq/L (ref 96–112)
Creatinine, Ser: 0.97 mg/dL (ref 0.40–1.50)
GFR: 76.39 mL/min (ref >60.00)
Glucose, Bld: 139 mg/dL — ABNORMAL HIGH (ref 70–99)
Potassium: 3.8 meq/L (ref 3.5–5.1)
Sodium: 133 meq/L — ABNORMAL LOW (ref 135–145)
Total Bilirubin: 0.9 mg/dL (ref 0.2–1.2)
Total Protein: 7.4 g/dL (ref 6.0–8.3)

## 2023-08-11 LAB — LIPASE: Lipase: 15 U/L (ref 11.0–59.0)

## 2023-08-11 MED ORDER — IOHEXOL 300 MG/ML  SOLN
100.0000 mL | Freq: Once | INTRAMUSCULAR | Status: AC | PRN
  Administered 2023-08-11: 100 mL via INTRAVENOUS

## 2023-08-11 NOTE — Progress Notes (Signed)
Report of  abnormal CT Abdomen/Pelvis phoned to [facility] @ 29:15.

## 2023-08-11 NOTE — Progress Notes (Signed)
Subjective:     Patient ID: [name], male    DOB: 1945-11-04, 78 y.o.   MRN: [id number]  Chief Complaint  Patient presents with   Abdominal Pain    Patient complains of abdominal pain with diarrhea since Sunday     Abdominal Pain    Discussed the use of AI scribe software for clinical note transcription with the patient, who gave verbal consent to proceed.  History of Present Illness    [name] is a 78 year old male with a history of small bowel obstruction who presents with abdominal pain and diarrhea.  He has been experiencing abdominal pain since Sunday night (2/9), primarily located in the upper abdomen. The pain has persisted despite his initial expectation of improvement. No vomiting, although he has attempted to induce it without success. No fever or blood in the stool. He is not aware of any recent contact with individuals experiencing similar symptoms.  He has diarrhea, with approximately five bowel movements yesterday and two so far today, described as watery. He has been consuming only water and experiences a sensation of incomplete bowel evacuation during bathroom visits.  He has been tolerating water.       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    Hemorrhoids 08/22/2016   Hemorrhoids, internal, with bleeding 2005   internal and external. 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   Procedure: REVERSE SHOULDER ARTHROPLASTY;  Surgeon: Cammy Copa, MD;  Location: [facility] OR;  Service: Orthopedics;  Laterality: Right;   TONSILLECTOMY  1957   TOTAL KNEE ARTHROPLASTY Right 07/24/2022   Procedure: RIGHT TOTAL KNEE ARTHROPLASTY;  Surgeon: Cammy Copa, MD;  Location: [facility] OR;  Service: Orthopedics;  Laterality: Right;   TOTAL SHOULDER ARTHROPLASTY Left 11/01/2020   Procedure: LEFT ANATOMIC SHOULDER REPLACEMENT;  Surgeon: Cammy Copa, MD;  Location: [facility] OR;  Service:  Orthopedics;  Laterality: Left;   VENTRAL HERNIA REPAIR N/A 06/03/2018   Procedure: LAPAROSCOPIC VENTRAL WALL  HERNIA  REPAIR WITH MESH  ERAS PATHWAY;  Surgeon: Karie Soda, MD;  Location: [facility] ORS;  Service: General;  Laterality: N/A;    Family History  Problem Relation Age of Onset   Stroke Mother    Aneurysm Mother        brain   Arthritis Father    Liver cancer Father        liver, atomic testing in military   Gallbladder disease Father    Hyperlipidemia Sister    Hypertension Sister    Obesity Sister    Diabetes Sister    Diabetes Brother    Hyperlipidemia  Sister    Hypertension Sister    Obesity Sister    Diabetes Sister    Aneurysm Brother        brain   COPD Brother    Gallbladder disease Sister        x4   Colon cancer Neg Hx    Colon polyps Neg Hx    Esophageal cancer Neg Hx    Rectal cancer Neg Hx    Stomach cancer Neg Hx    Prostate cancer Neg Hx    CAD Neg Hx     Social History   Socioeconomic History   Marital status: Married    Spouse name: Not on file   Number of children: 1   Years of education: Not on file   Highest education level: Not on file  Occupational History   Occupation: Retired    Associate Professor: [employer]  Tobacco Use   Smoking status: Never   Smokeless tobacco: Never  Vaping Use   Vaping status: Never Used  Substance and Sexual Activity   Alcohol use: Not Currently    Comment: Occassionally   Drug use: No   Sexual activity: Yes  Other Topics Concern   Not on file  Social History Narrative   Married - 1 child   Retired from [employer]   Some EtOH, no tbacco/drug use   Social Drivers of Corporate investment banker Strain: Low Risk  (05/05/2023)   Overall Financial Resource Strain (CARDIA)    Difficulty of Paying Living Expenses: Not hard at all  Food Insecurity: No Food Insecurity (05/05/2023)   Hunger Vital Sign    Worried About Running Out of Food in the Last Year: Never true    Ran Out of Food in the Last Year: Never true  Transportation Needs: No Transportation Needs (05/05/2023)   PRAPARE - Administrator, Civil Service (Medical): No    Lack of Transportation (Non-Medical): No  Physical Activity: Sufficiently Active (05/05/2023)   Exercise Vital Sign    Days of Exercise per Week: 3 days    Minutes of Exercise per Session: 120 min  Stress: No Stress Concern Present (05/05/2023)   [employer] of Occupational Health - Occupational Stress Questionnaire    Feeling of Stress : Not at all  Social Connections: Socially Integrated (05/05/2023)   Social Connection and  Isolation Panel [NHANES]    Frequency of Communication with Friends and Family: More than three times a week    Frequency of Social Gatherings with Friends and Family: More than three times a week    Attends Religious Services: More than 4 times per year    Active Member of [employer] or Organizations: Yes    Attends Banker Meetings: More than 4 times  per year    Marital Status: Married  Catering manager Violence: Not At Risk (05/05/2023)   Humiliation, Afraid, Rape, and Kick questionnaire    Fear of Current or Ex-Partner: No    Emotionally Abused: No    Physically Abused: No    Sexually Abused: No    Outpatient Medications Prior to Visit  Medication Sig Dispense Refill   acetaminophen (TYLENOL) 325 MG tablet Take 1-2 tablets (325-650 mg total) by mouth every 6 (six) hours as needed for mild pain (pain score 1-3 or temp > 100.5). 45 tablet 0   ALPRAZolam (XANAX) 1 MG tablet TAKE 1 TABLET (1 MG TOTAL) BY MOUTH AT BEDTIME AS NEEDED FOR ANXIETY. 30 tablet 1   amLODipine (NORVASC) 10 MG tablet TAKE 1 TABLET DAILY 90 tablet 3   aspirin 81 MG chewable tablet Chew 1 tablet (81 mg total) by mouth 2 (two) times daily. 60 tablet 0   gabapentin (NEURONTIN) 100 MG capsule Take 1 capsule (100 mg total) by mouth 2 (two) times daily. 60 capsule 1   Iron, Ferrous Sulfate, 325 (65 Fe) MG TABS Take 325 mg by mouth daily. 30 tablet 1   losartan (COZAAR) 50 MG tablet Take 1 tablet (50 mg total) by mouth daily. 90 tablet 3   methocarbamol (ROBAXIN) 500 MG tablet Take 1 tablet (500 mg total) by mouth every 8 (eight) hours as needed for muscle spasms. 30 tablet 0   metoprolol succinate (TOPROL-XL) 50 MG 24 hr tablet TAKE AS INSTRUCTED BY YOUR PRESCRIBER (Patient taking differently: Take 50 mg by mouth daily.) 90 tablet 3   Multiple Vitamins-Minerals (MULTIVITAMIN ADULT PO) Take 2 tablets by mouth daily.     rosuvastatin (CRESTOR) 20 MG tablet Take 1 tablet (20 mg total) by mouth daily. 90 tablet 3    tamsulosin (FLOMAX) 0.4 MG CAPS capsule Take 0.4 mg by mouth daily.     triamcinolone cream (KENALOG) 0.1 % Apply 1 Application topically 2 (two) times daily. 45 g 0   oxyCODONE (OXY IR/ROXICODONE) 5 MG immediate release tablet Take 1 tablet (5 mg total) by mouth every 12 (twelve) hours as needed for moderate pain (pain score 4-6) (pain score 4-6). 20 tablet 0   No facility-administered medications prior to visit.    No Known Allergies  Review of Systems  Gastrointestinal:  Positive for abdominal pain.       Objective:    Physical Exam Constitutional:      General: He is not in acute distress.    Appearance: He is well-developed.     Comments: Appears uncomfortable  HENT:     Head: Normocephalic and atraumatic.  Cardiovascular:     Rate and Rhythm: Normal rate and regular rhythm.     Heart sounds: No murmur heard.    Comments: Mild tachycardia Pulmonary:     Effort: Pulmonary effort is normal. No respiratory distress.     Breath sounds: Normal breath sounds. No wheezing or rales.  Abdominal:     General: Bowel sounds are normal.     Tenderness: There is abdominal tenderness in the epigastric area, left upper quadrant and left lower quadrant. There is no guarding or rebound.     Comments: Mild abdominal distension  Skin:    General: Skin is warm and dry.  Neurological:     Mental Status: He is alert and oriented to person, place, and time.  Psychiatric:        Behavior: Behavior normal.  Thought Content: Thought content normal.      BP 132/79 (BP Location: Right Arm, Patient Position: Sitting, Cuff Size: Large)   Pulse (!) 101   Temp 98.9 F (37.2 C) (Oral)   Resp 16   Ht 5\' 10"  (1.778 m)   Wt 214 lb (97.1 kg)   SpO2 99%   BMI 30.71 kg/m  Wt Readings from Last 3 Encounters:  08/11/23 214 lb (97.1 kg)  05/14/23 207 lb (93.9 kg)  05/05/23 199 lb (90.3 kg)       Assessment & Plan:   Problem List Items Addressed This Visit       Unprioritized    Abdominal pain - Primary   New.  Will obtain stat labs as below and order CT abd/pelvis with contrast to be done as soon as labs are available this afternoon.  Need to update Cr.   Differential includes SBO, viral gastroenteritis, pancreatitis, and diverticulitis.   If CT unrevealing would plan stool studies.  Pt is advised to go to the ER if worsening abdominal pain, if unable to keep down fluids or if he develops fever >101.  Pt verbalizes understanding.      Relevant Orders   Comp Met (CMET)   CBC w/Diff   Lipase   CT ABDOMEN PELVIS W CONTRAST    I have discontinued [name]'s oxyCODONE. I am also having him maintain his Multiple Vitamins-Minerals (MULTIVITAMIN ADULT PO), tamsulosin, aspirin, rosuvastatin, Iron (Ferrous Sulfate), metoprolol succinate, acetaminophen, methocarbamol, gabapentin, triamcinolone cream, ALPRAZolam, losartan, and amLODipine.  No orders of the defined types were placed in this encounter.

## 2023-08-11 NOTE — Telephone Encounter (Signed)
Reviewed CT scan concerning for possible developing SBO. Called pt x 2- no answer, straight to voicemail. Left message instructing pt to go to ED now for possible early SBO.  Will send a mychart message as well. Left detailed message on wife's voicemail as well.

## 2023-08-11 NOTE — Assessment & Plan Note (Addendum)
New.  Will obtain stat labs as below and order CT abd/pelvis with contrast to be done as soon as labs are available this afternoon.  Need to update Cr.   Differential includes SBO, viral gastroenteritis, pancreatitis, and diverticulitis.   If CT unrevealing would plan stool studies.  Pt is advised to go to the ER if worsening abdominal pain, if unable to keep down fluids or if he develops fever >101.  Pt verbalizes understanding.

## 2023-08-11 NOTE — Patient Instructions (Signed)
VISIT SUMMARY:  [name], you came in today with abdominal pain and diarrhea. You have been experiencing pain in your upper abdomen since Sunday night, and despite your efforts, the pain has not improved. You also mentioned having watery bowel movements and a sensation of incomplete evacuation. Given your history of small bowel obstruction, we are taking these symptoms seriously.  YOUR PLAN:  -ACUTE ABDOMINAL PAIN: Acute abdominal pain can be caused by various conditions, including infections, obstructions, or other gastrointestinal issues. We have ordered urgent lab tests to check for infection and kidney function, and a CT scan of your abdomen to rule out any obstruction or infection. If your pain worsens, you are unable to keep down liquids, or you develop a fever over 101F, please go to the emergency room immediately.  INSTRUCTIONS:  Please go for the CT scan this afternoon as scheduled, and we will review the lab results as soon as they are available. Follow up with [place] if you experience any worsening symptoms or new concerns.

## 2023-08-12 ENCOUNTER — Inpatient Hospital Stay (HOSPITAL_BASED_OUTPATIENT_CLINIC_OR_DEPARTMENT_OTHER)
Admission: EM | Admit: 2023-08-12 | Discharge: 2023-08-28 | DRG: 329 | Disposition: A | Discharge status: Home Health | Payer: Medicare Other | Attending: Internal Medicine | Admitting: Internal Medicine

## 2023-08-12 ENCOUNTER — Other Ambulatory Visit: Payer: Self-pay

## 2023-08-12 ENCOUNTER — Encounter (HOSPITAL_BASED_OUTPATIENT_CLINIC_OR_DEPARTMENT_OTHER): Payer: Self-pay | Admitting: Internal Medicine

## 2023-08-12 DIAGNOSIS — F419 Anxiety disorder, unspecified: Secondary | ICD-10-CM | POA: Diagnosis present

## 2023-08-12 DIAGNOSIS — Z8719 Personal history of other diseases of the digestive system: Secondary | ICD-10-CM

## 2023-08-12 DIAGNOSIS — Z823 Family history of stroke: Secondary | ICD-10-CM

## 2023-08-12 DIAGNOSIS — N179 Acute kidney failure, unspecified: Secondary | ICD-10-CM | POA: Diagnosis not present

## 2023-08-12 DIAGNOSIS — E46 Unspecified protein-calorie malnutrition: Secondary | ICD-10-CM | POA: Insufficient documentation

## 2023-08-12 DIAGNOSIS — E440 Moderate protein-calorie malnutrition: Secondary | ICD-10-CM | POA: Diagnosis present

## 2023-08-12 DIAGNOSIS — E876 Hypokalemia: Secondary | ICD-10-CM | POA: Diagnosis present

## 2023-08-12 DIAGNOSIS — Z825 Family history of asthma and other chronic lower respiratory diseases: Secondary | ICD-10-CM

## 2023-08-12 DIAGNOSIS — E785 Hyperlipidemia, unspecified: Secondary | ICD-10-CM | POA: Diagnosis present

## 2023-08-12 DIAGNOSIS — D6489 Other specified anemias: Secondary | ICD-10-CM | POA: Diagnosis present

## 2023-08-12 DIAGNOSIS — K56609 Unspecified intestinal obstruction, unspecified as to partial versus complete obstruction: Principal | ICD-10-CM | POA: Diagnosis present

## 2023-08-12 DIAGNOSIS — Z9841 Cataract extraction status, right eye: Secondary | ICD-10-CM

## 2023-08-12 DIAGNOSIS — K658 Other peritonitis: Secondary | ICD-10-CM | POA: Diagnosis present

## 2023-08-12 DIAGNOSIS — Z860101 Personal history of adenomatous and serrated colon polyps: Secondary | ICD-10-CM

## 2023-08-12 DIAGNOSIS — Z83438 Family history of other disorder of lipoprotein metabolism and other lipidemia: Secondary | ICD-10-CM

## 2023-08-12 DIAGNOSIS — Z96612 Presence of left artificial shoulder joint: Secondary | ICD-10-CM | POA: Diagnosis present

## 2023-08-12 DIAGNOSIS — K30 Functional dyspepsia: Secondary | ICD-10-CM | POA: Diagnosis not present

## 2023-08-12 DIAGNOSIS — K567 Ileus, unspecified: Secondary | ICD-10-CM

## 2023-08-12 DIAGNOSIS — R1084 Generalized abdominal pain: Secondary | ICD-10-CM | POA: Diagnosis not present

## 2023-08-12 DIAGNOSIS — K5669 Other partial intestinal obstruction: Principal | ICD-10-CM | POA: Diagnosis present

## 2023-08-12 DIAGNOSIS — E8809 Other disorders of plasma-protein metabolism, not elsewhere classified: Secondary | ICD-10-CM | POA: Diagnosis not present

## 2023-08-12 DIAGNOSIS — E871 Hypo-osmolality and hyponatremia: Secondary | ICD-10-CM | POA: Diagnosis present

## 2023-08-12 DIAGNOSIS — A0811 Acute gastroenteropathy due to Norwalk agent: Secondary | ICD-10-CM | POA: Diagnosis present

## 2023-08-12 DIAGNOSIS — Z833 Family history of diabetes mellitus: Secondary | ICD-10-CM

## 2023-08-12 DIAGNOSIS — I10 Essential (primary) hypertension: Secondary | ICD-10-CM | POA: Diagnosis present

## 2023-08-12 DIAGNOSIS — Z96651 Presence of right artificial knee joint: Secondary | ICD-10-CM | POA: Diagnosis present

## 2023-08-12 DIAGNOSIS — Z79899 Other long term (current) drug therapy: Secondary | ICD-10-CM

## 2023-08-12 DIAGNOSIS — R110 Nausea: Secondary | ICD-10-CM | POA: Diagnosis not present

## 2023-08-12 DIAGNOSIS — K56699 Other intestinal obstruction unspecified as to partial versus complete obstruction: Secondary | ICD-10-CM | POA: Diagnosis not present

## 2023-08-12 DIAGNOSIS — Z8249 Family history of ischemic heart disease and other diseases of the circulatory system: Secondary | ICD-10-CM

## 2023-08-12 DIAGNOSIS — Z9842 Cataract extraction status, left eye: Secondary | ICD-10-CM

## 2023-08-12 DIAGNOSIS — D62 Acute posthemorrhagic anemia: Secondary | ICD-10-CM | POA: Diagnosis not present

## 2023-08-12 DIAGNOSIS — K631 Perforation of intestine (nontraumatic): Secondary | ICD-10-CM | POA: Diagnosis present

## 2023-08-12 DIAGNOSIS — Z96611 Presence of right artificial shoulder joint: Secondary | ICD-10-CM | POA: Diagnosis present

## 2023-08-12 DIAGNOSIS — N400 Enlarged prostate without lower urinary tract symptoms: Secondary | ICD-10-CM | POA: Diagnosis present

## 2023-08-12 DIAGNOSIS — Z8261 Family history of arthritis: Secondary | ICD-10-CM

## 2023-08-12 DIAGNOSIS — Z961 Presence of intraocular lens: Secondary | ICD-10-CM | POA: Diagnosis present

## 2023-08-12 DIAGNOSIS — R140 Abdominal distension (gaseous): Secondary | ICD-10-CM | POA: Diagnosis not present

## 2023-08-12 DIAGNOSIS — E66811 Obesity, class 1: Secondary | ICD-10-CM | POA: Diagnosis present

## 2023-08-12 DIAGNOSIS — E861 Hypovolemia: Secondary | ICD-10-CM | POA: Diagnosis not present

## 2023-08-12 DIAGNOSIS — Z7982 Long term (current) use of aspirin: Secondary | ICD-10-CM

## 2023-08-12 DIAGNOSIS — Z6830 Body mass index (BMI) 30.0-30.9, adult: Secondary | ICD-10-CM

## 2023-08-12 LAB — CBC WITH DIFFERENTIAL/PLATELET
Abs Immature Granulocytes: 0.02 10*3/uL (ref 0.00–0.07)
Basophils Absolute: 0 10*3/uL (ref 0.0–0.1)
Basophils Relative: 0 %
Eosinophils Absolute: 0 10*3/uL (ref 0.0–0.5)
Eosinophils Relative: 0 %
HCT: 37.6 % — ABNORMAL LOW (ref 39.0–52.0)
Hemoglobin: 12.8 g/dL — ABNORMAL LOW (ref 13.0–17.0)
Immature Granulocytes: 1 %
Lymphocytes Relative: 34 %
Lymphs Abs: 1.3 10*3/uL (ref 0.7–4.0)
MCH: 28.3 pg (ref 26.0–34.0)
MCHC: 34 g/dL (ref 30.0–36.0)
MCV: 83 fL (ref 80.0–100.0)
Monocytes Absolute: 0.8 10*3/uL (ref 0.1–1.0)
Monocytes Relative: 21 %
Neutro Abs: 1.7 10*3/uL (ref 1.7–7.7)
Neutrophils Relative %: 44 %
Platelets: 168 10*3/uL (ref 150–400)
RBC: 4.53 MIL/uL (ref 4.22–5.81)
RDW: 13.5 % (ref 11.5–15.5)
WBC: 3.8 10*3/uL — ABNORMAL LOW (ref 4.0–10.5)
nRBC: 0 % (ref 0.0–0.2)

## 2023-08-12 LAB — COMPREHENSIVE METABOLIC PANEL
ALT: 23 U/L (ref 0–44)
AST: 20 U/L (ref 15–41)
Albumin: 3.5 g/dL (ref 3.5–5.0)
Alkaline Phosphatase: 49 U/L (ref 38–126)
Anion gap: 9 (ref 5–15)
BUN: 18 mg/dL (ref 8–23)
CO2: 20 mmol/L — ABNORMAL LOW (ref 22–32)
Calcium: 8.7 mg/dL — ABNORMAL LOW (ref 8.9–10.3)
Chloride: 102 mmol/L (ref 98–111)
Creatinine, Ser: 1.12 mg/dL (ref 0.61–1.24)
GFR, Estimated: >60 mL/min (ref >60)
Glucose, Bld: 126 mg/dL — ABNORMAL HIGH (ref 70–99)
Potassium: 3.2 mmol/L — ABNORMAL LOW (ref 3.5–5.1)
Sodium: 131 mmol/L — ABNORMAL LOW (ref 135–145)
Total Bilirubin: 1.2 mg/dL (ref 0.0–1.2)
Total Protein: 6.7 g/dL (ref 6.5–8.1)

## 2023-08-12 LAB — URINALYSIS, ROUTINE W REFLEX MICROSCOPIC
Bilirubin Urine: NEGATIVE
Glucose, UA: NEGATIVE mg/dL
Hgb urine dipstick: NEGATIVE
Ketones, ur: NEGATIVE mg/dL
Leukocytes,Ua: NEGATIVE
Nitrite: NEGATIVE
Protein, ur: NEGATIVE mg/dL
Specific Gravity, Urine: 1.01 (ref 1.005–1.030)
pH: 6.5 (ref 5.0–8.0)

## 2023-08-12 LAB — LACTIC ACID, PLASMA: Lactic Acid, Venous: 1.3 mmol/L (ref 0.5–1.9)

## 2023-08-12 LAB — LIPASE, BLOOD: Lipase: 25 U/L (ref 11–51)

## 2023-08-12 MED ORDER — ONDANSETRON HCL 4 MG/2ML IJ SOLN
4.0000 mg | Freq: Once | INTRAMUSCULAR | Status: AC
  Administered 2023-08-12: 4 mg via INTRAVENOUS
  Filled 2023-08-12: qty 2

## 2023-08-12 MED ORDER — POTASSIUM CHLORIDE CRYS ER 20 MEQ PO TBCR
40.0000 meq | EXTENDED_RELEASE_TABLET | Freq: Once | ORAL | Status: DC
Start: 2023-08-12 — End: 2023-08-12

## 2023-08-12 MED ORDER — ASPIRIN 81 MG PO CHEW
81.0000 mg | CHEWABLE_TABLET | Freq: Every day | ORAL | Status: DC
  Administered 2023-08-13 – 2023-08-17 (×5): 81 mg via ORAL
  Filled 2023-08-12 (×5): qty 1

## 2023-08-12 MED ORDER — MORPHINE SULFATE (PF) 4 MG/ML IV SOLN
4.0000 mg | Freq: Once | INTRAVENOUS | Status: AC
  Administered 2023-08-12: 4 mg via INTRAVENOUS
  Filled 2023-08-12: qty 1

## 2023-08-12 MED ORDER — SODIUM CHLORIDE 0.9 % IV SOLN: INTRAVENOUS | Status: AC

## 2023-08-12 MED ORDER — ROSUVASTATIN CALCIUM 20 MG PO TABS
20.0000 mg | ORAL_TABLET | Freq: Every day | ORAL | Status: DC
Start: 2023-08-13 — End: 2023-08-20
  Administered 2023-08-13 – 2023-08-20 (×8): 20 mg via ORAL
  Filled 2023-08-12 (×8): qty 1

## 2023-08-12 MED ORDER — POTASSIUM CHLORIDE CRYS ER 20 MEQ PO TBCR
40.0000 meq | EXTENDED_RELEASE_TABLET | Freq: Two times a day (BID) | ORAL | Status: AC
Start: 2023-08-12 — End: 2023-08-12
  Administered 2023-08-12 (×2): 40 meq via ORAL
  Filled 2023-08-12 (×2): qty 2

## 2023-08-12 MED ORDER — ACETAMINOPHEN 325 MG PO TABS: 650.0000 mg | ORAL_TABLET | Freq: Four times a day (QID) | ORAL | Status: DC | PRN

## 2023-08-12 MED ORDER — ACETAMINOPHEN 650 MG RE SUPP: 650.0000 mg | Freq: Four times a day (QID) | RECTAL | Status: DC | PRN

## 2023-08-12 MED ORDER — METOPROLOL SUCCINATE ER 50 MG PO TB24
50.0000 mg | ORAL_TABLET | Freq: Every day | ORAL | Status: DC
  Administered 2023-08-13 – 2023-08-20 (×8): 50 mg via ORAL
  Filled 2023-08-12 (×8): qty 1

## 2023-08-12 MED ORDER — OXYCODONE HCL 5 MG PO TABS
5.0000 mg | ORAL_TABLET | Freq: Four times a day (QID) | ORAL | Status: DC | PRN
  Administered 2023-08-12 – 2023-08-13 (×2): 5 mg via ORAL
  Filled 2023-08-12 (×2): qty 1

## 2023-08-12 MED ORDER — ONDANSETRON HCL 4 MG/2ML IJ SOLN
4.0000 mg | Freq: Four times a day (QID) | INTRAMUSCULAR | Status: DC | PRN
  Administered 2023-08-14 – 2023-08-21 (×4): 4 mg via INTRAVENOUS
  Filled 2023-08-12 (×5): qty 2

## 2023-08-12 MED ORDER — HYDROMORPHONE HCL 1 MG/ML IJ SOLN
0.5000 mg | INTRAMUSCULAR | Status: DC | PRN
  Administered 2023-08-12 – 2023-08-17 (×4): 0.5 mg via INTRAVENOUS
  Filled 2023-08-12 (×4): qty 0.5

## 2023-08-12 MED ORDER — SODIUM CHLORIDE 0.9 % IV BOLUS
1000.0000 mL | Freq: Once | INTRAVENOUS | Status: AC
  Administered 2023-08-12: 1000 mL via INTRAVENOUS

## 2023-08-12 MED ORDER — ENOXAPARIN SODIUM 40 MG/0.4ML IJ SOSY
40.0000 mg | PREFILLED_SYRINGE | INTRAMUSCULAR | Status: DC
  Administered 2023-08-12 – 2023-08-16 (×5): 40 mg via SUBCUTANEOUS
  Filled 2023-08-12 (×5): qty 0.4

## 2023-08-12 NOTE — H&P (Signed)
History and Physical    Patient: [name] ZOX:[id number] DOB: 06/29/1946 DOA: 08/12/2023 DOS: the patient was seen and examined on 08/12/2023 PCP: Bradd Canary, MD  Patient coming from: [facility] ED  Chief Complaint:  Chief Complaint  Patient presents with   Abdominal Pain   HPI: [name] is a 78 y.o. male with medical history significant of HTN, HLD, hx of SBO presenting to the ED with abdominal pain.  Patient reports that he has been having abdominal pain since Monday. Abdominal pain is periumbilical and does not radiate anywhere else. He had normal fully-formed BMs on Sunday, but on Monday began noticing very loose stools. Reports going about 5-6 times per day since Monday. Does endorse some associated dry heaving, but otherwise no vomiting. He denies any fevers, chills, chest pain, palpitations, SHOB, cough. He denies any recent travel or known sick contacts.  He was seen by his PCP yesterday for this complaint and was sent for an outpatient CT scan last night. CT showed question of early SBO and he was directed to the ED for further evaluation. Of note, patient has a history of SBO requiring admission in the past but he has never required surgical intervention for this.  ED course: Vital signs stable.  CBC without leukocytosis and with stable hemoglobin.  CMP with mild hyponatremia, mild hypokalemia, hyperglycemia 126.  Lipase normal.  Lactic acid normal.  UA unremarkable.  Outpatient CT scan done yesterday night showing segmental dilation of the mid and distal jejunum, concerning for SBO versus segmental ileus.  General surgery consulted by ED provider.  [facility] hospitalist asked to evaluate patient for admission.   Review of Systems: As mentioned in the history of present illness. All other systems reviewed and are negative. Past Medical History:  Diagnosis Date   Abnormal CT scan, gastrointestinal tract suspect angioedema    Anemia 03/16/2016   IRON   Anxiety     Arthritis    Back pain 10/10/2012   Fatty liver 2005   seen on 2005 ultrasound,05/2015 CT.    Hemorrhoids 08/22/2016   Hemorrhoids, internal, with bleeding 2005   internal and external. 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   Procedure: REVERSE SHOULDER ARTHROPLASTY;  Surgeon: Cammy Copa,  MD;  Location: [facility] OR;  Service: Orthopedics;  Laterality: Right;   TONSILLECTOMY  1957   TOTAL KNEE ARTHROPLASTY Right 07/24/2022   Procedure: RIGHT TOTAL KNEE ARTHROPLASTY;  Surgeon: Cammy Copa, MD;  Location: [facility] OR;  Service: Orthopedics;  Laterality: Right;   TOTAL SHOULDER ARTHROPLASTY Left 11/01/2020   Procedure: LEFT ANATOMIC SHOULDER REPLACEMENT;  Surgeon: Cammy Copa, MD;  Location: [facility] OR;  Service: Orthopedics;  Laterality: Left;   VENTRAL HERNIA REPAIR N/A 06/03/2018    Procedure: LAPAROSCOPIC VENTRAL WALL  HERNIA  REPAIR WITH MESH  ERAS PATHWAY;  Surgeon: Karie Soda, MD;  Location: [facility] ORS;  Service: General;  Laterality: N/A;   Social History:  reports that he has never smoked. He has never used smokeless tobacco. He reports that he does not currently use alcohol. He reports that he does not use drugs.  No Known Allergies  Family History  Problem Relation Age of Onset   Stroke Mother    Aneurysm Mother        brain   Arthritis Father    Liver cancer Father        liver, atomic testing in military   Gallbladder disease Father    Hyperlipidemia Sister    Hypertension Sister    Obesity Sister    Diabetes Sister    Diabetes Brother    Hyperlipidemia Sister    Hypertension Sister    Obesity Sister    Diabetes Sister    Aneurysm Brother        brain   COPD Brother    Gallbladder disease Sister        x4   Colon cancer Neg Hx    Colon polyps Neg Hx    Esophageal cancer Neg Hx    Rectal cancer Neg Hx    Stomach cancer Neg Hx    Prostate cancer Neg Hx    CAD Neg Hx     Prior to Admission medications   Medication Sig Start Date End Date Taking? Authorizing Provider  sildenafil (VIAGRA) 100 MG tablet Take 100 mg by mouth as needed for erectile dysfunction (1 hour prior to intercourse). 07/18/23  Yes [provider]  acetaminophen (TYLENOL) 325 MG tablet Take 1-2 tablets (325-650 mg total) by mouth every 6 (six) hours as needed for mild pain (pain score 1-3 or temp > 100.5). 03/13/23   Cammy Copa, MD  amLODipine (NORVASC) 10 MG tablet TAKE 1 TABLET DAILY 07/06/23   Bradd Canary, MD  aspirin 81 MG chewable tablet Chew 1 tablet (81 mg total) by mouth 2 (two) times daily. 07/25/22   Magnant, Charles L, PA-C  gabapentin (NEURONTIN) 100 MG capsule Take 1 capsule (100 mg total) by mouth 2 (two) times daily. 04/02/23   Magnant, Charles L, PA-C  Iron, Ferrous Sulfate, 325 (65 Fe) MG TABS Take 325 mg by mouth daily. 11/12/22   Bradd Canary, MD  losartan (COZAAR) 50 MG tablet Take 1 tablet (50 mg total) by mouth daily. 05/14/23   Bradd Canary, MD  metoprolol succinate (TOPROL-XL) 50 MG 24 hr tablet TAKE AS INSTRUCTED BY YOUR PRESCRIBER Patient taking differently: Take 50 mg by mouth daily. 01/12/23   Bradd Canary, MD  Multiple Vitamins-Minerals (MULTIVITAMIN ADULT PO) Take 2 tablets by mouth daily.    [provider]  rosuvastatin (CRESTOR) 20 MG tablet Take 1 tablet (20 mg total) by mouth daily. 10/22/22   Bradd Canary, MD  tamsulosin (FLOMAX) 0.4 MG CAPS  capsule Take 0.4 mg by mouth daily. 03/26/21   [provider]    Physical Exam: Vitals:   08/12/23 1100 08/12/23 1300 08/12/23 1315 08/12/23 1330  BP: 130/88 (!) 133/90    Pulse: 88 83 83   Resp: 16     Temp:    98.5 F (36.9 C)  TempSrc:    Oral  SpO2: 100% 100% 96%    Physical Exam Constitutional:      Appearance: He is well-developed. He is obese. He is not ill-appearing.  HENT:     Head: Normocephalic and atraumatic.     Mouth/Throat:     Pharynx: Oropharynx is clear. No oropharyngeal exudate.  Eyes:     General: No scleral icterus.    Extraocular Movements: Extraocular movements intact.     Pupils: Pupils are equal, round, and reactive to light.  Cardiovascular:     Rate and Rhythm: Normal rate and regular rhythm.     Heart sounds: Normal heart sounds. No murmur heard.    No friction rub. No gallop.  Pulmonary:     Effort: Pulmonary effort is normal. No respiratory distress.     Breath sounds: Normal breath sounds. No wheezing, rhonchi or rales.  Abdominal:     General: Bowel sounds are normal. There is distension.     Palpations: Abdomen is soft.     Tenderness: There is abdominal tenderness in the epigastric area. There is no guarding or rebound.  Musculoskeletal:        General: No swelling. Normal range of motion.  Skin:    General: Skin is warm and dry.  Neurological:     General: No focal deficit present.      Mental Status: He is alert and oriented to person, place, and time.  Psychiatric:        Mood and Affect: Mood normal.        Behavior: Behavior normal.      Data Reviewed:  There are no new results to review at this time.     Latest Ref Rng & Units 08/12/2023    9:27 AM 08/11/2023   10:46 AM 05/14/2023    2:12 PM  CBC  WBC 4.0 - 10.5 K/uL 3.8  5.6  5.2   Hemoglobin 13.0 - 17.0 g/dL 16.1  09.6  04.5   Hematocrit 39.0 - 52.0 % 37.6  42.7  38.4   Platelets 150 - 400 K/uL 168  175.0  211.0       Latest Ref Rng & Units 08/12/2023    9:27 AM 08/11/2023   10:46 AM 05/14/2023    2:12 PM  CMP  Glucose 70 - 99 mg/dL 409  811  93   BUN 8 - 23 mg/dL 18  16  15    Creatinine 0.61 - 1.24 mg/dL 9.14  7.82  9.56   Sodium 135 - 145 mmol/L 131  133  141   Potassium 3.5 - 5.1 mmol/L 3.2  3.8  4.0   Chloride 98 - 111 mmol/L 102  100  106   CO2 22 - 32 mmol/L 20  25  28    Calcium 8.9 - 10.3 mg/dL 8.7  8.8  9.4   Total Protein 6.5 - 8.1 g/dL 6.7  7.4  7.1   Total Bilirubin 0.0 - 1.2 mg/dL 1.2  0.9  0.6   Alkaline Phos 38 - 126 U/L 49  66  96   AST 15 - 41 U/L 20  20  22  ALT 0 - 44 U/L 23  21  20     CT ABDOMEN PELVIS W CONTRAST Result Date: 08/11/2023 CLINICAL DATA:  Epigastric and mid abdominal pain, nausea and diarrhea since Sunday EXAM: CT ABDOMEN AND PELVIS WITH CONTRAST TECHNIQUE: Multidetector CT imaging of the abdomen and pelvis was performed using the standard protocol following bolus administration of intravenous contrast. RADIATION DOSE REDUCTION: This exam was performed according to the departmental dose-optimization program which includes automated exposure control, adjustment of the mA and/or kV according to patient size and/or use of iterative reconstruction technique. CONTRAST:  OMNIPAQUE IOHEXOL 300 MG/ML  SOLN COMPARISON:  07/20/2019 FINDINGS: Lower chest: No acute pleural or parenchymal lung disease. Hepatobiliary: No focal liver abnormality is seen. No gallstones,  gallbladder wall thickening, or biliary dilatation. Pancreas: Unremarkable. No pancreatic ductal dilatation or surrounding inflammatory changes. Spleen: Normal in size without focal abnormality. Adrenals/Urinary Tract: Stable bilateral renal cortical cysts. No specific follow-up recommended. No urinary tract calculi or obstructive uropathy within either kidney. The adrenals and bladder are unremarkable. Stomach/Bowel: There is segmental dilation of the mid to distal jejunum within the left mid abdomen, measuring up to 4.4 cm and maximal diameter and with multiple gas fluid levels identified. The proximal jejunum and distal ileum are normal in caliber. Findings are consistent with small-bowel obstruction versus long segmental ileus. Given the normal caliber of the proximal jejunum and distal ileum, developing closed loop obstruction could be considered. Sigmoid diverticulosis without diverticulitis. Normal appendix right lower quadrant. Vascular/Lymphatic: Aortic atherosclerosis. No enlarged abdominal or pelvic lymph nodes. Reproductive: Prostate is enlarged. Other: Trace pelvic free fluid. Central mesenteric edema. No free intraperitoneal gas. No abdominal wall hernia. Musculoskeletal: No acute or destructive bony abnormalities. Reconstructed images demonstrate no additional findings. IMPRESSION: 1. Segmental dilation of the mid and distal jejunum, favor developing small bowel obstruction over segmental ileus. Given the normal caliber of the proximal jejunum and distal ileum, closed loop obstruction could be considered. 2. Stable Bosniak category 1 and 2 renal cysts. No specific imaging follow-up is recommended. 3. Trace pelvic free fluid and central mesenteric edema as above. 4. Sigmoid diverticulosis without diverticulitis. 5. Enlarged prostate. These results will be called to the ordering clinician or representative by the Radiologist Assistant, and communication documented in the PACS or [employer].  Electronically Signed   By: Sharlet Salina M.D.   On: 08/11/2023 20:02     Assessment and Plan: No notes have been filed under this hospital service. Service: Hospitalist  Partial SBO versus segmental ileus History of SBO History of lap ventral hernia repair with mesh (Dr. Michaell Cowing, 2019) Patient presenting with several days of abdominal pain with associated loose stools.  CT imaging revealed segmental dilation of the mid and distal jejunum, concerning for partial SBO versus segmental ileus.  General surgery has evaluated patient, no current indication for emergency surgery.  No indication for NG tube currently given patient is not actively nauseated or vomiting.  Patient was started on a clear liquid diet by general surgery.  Given diarrhea for past 2 days, will obtain GI panel to rule out gastroenteritis.  Plan is for conservative management at this time, but if this fails may need to consider exploratory surgery. -Appreciate general surgery assistance -No current indication for emergency surgery or NG tube -Follow-up GI panel -Zofran every 6 hours as needed for nausea -Tylenol 650 mg every 6 as needed for mild pain -Oxycodone 5 mg every 6 hours as needed for moderate pain -Dilaudid 0.5 mg every 3 hours  as needed for severe pain -Mobilize as tolerated -Lovenox for DVT prophylaxis -Place in observation  Hypertension Patient reports only taking Toprol-XL 50 mg daily at home.  He does have amlodipine 10 mg daily and losartan 50 mg daily listed on medication list but reports not taking these. -Resume home Toprol-XL 50 mg daily -If blood pressure remains above goal, can consider adding Norvasc and/or losartan  Hyperlipidemia -Resume home Crestor 20 mg daily   Advance Care Planning:   Code Status: Full Code   Consults: general surgery  Family Communication: no family at bedside  Severity of Illness: The appropriate patient status for this patient is OBSERVATION. Observation status is  judged to be reasonable and necessary in order to provide the required intensity of service to ensure the patient's safety. The patient's presenting symptoms, physical exam findings, and initial radiographic and laboratory data in the context of their medical condition is felt to place them at decreased risk for further clinical deterioration. Furthermore, it is anticipated that the patient will be medically stable for discharge from the hospital within 2 midnights of admission.   Portions of this note were generated with Scientist, clinical (histocompatibility and immunogenetics). Dictation errors may occur despite best attempts at proofreading.  Author: Briscoe Burns, MD 08/12/2023 3:26 PM  For on call review [email or web address].

## 2023-08-12 NOTE — Telephone Encounter (Signed)
Patient was contacted and advised to go to the emergency department for possible developing SBO. He verbalized understanding.

## 2023-08-12 NOTE — ED Triage Notes (Signed)
Pt reports having abd all over that developed Sunday night. Was seen by PCP yesterday and she advised CT scan. They had no outpatient appointments so she told him to come to ED.   Denies fever and chills. Last solid BM was Sunday night. Reports ongoing nausea and diarrhea.   Hx of bowel obstruction w/ no surgery.

## 2023-08-12 NOTE — Consult Note (Signed)
Consult Note  [name] 1946-05-27  [id number].    Requesting MD: Dr. Elayne Snare Chief Complaint/Reason for Consult: possible SBO  HPI:  78 y.o. male with medical history significant for anxiety, arthritis, HLD, HTN, prediabetes who presented to [facility] ED with abdominal pain, nausea, and diarrhea. Symptoms began 3 days ago. He saw his PCP yesterday 2/11 and labs and CT were ordered. CT showed developing SBO and he was directed to the ED for further evaluation. Nausea has improved since symptom onset. He denies emesis. He is having loose bowel movements with last BM in the ED at [facility]. He reports pain is similar to previous bowel obstruction which resolved non-operatively. Pain is somewhat worsened since onset along with distention. Denies sick contacts, fevers, respiratory symptoms. He has been drinking water to stay hydrated and denies urinary symptoms. He was ambulatory in room at time of my arrival and non-toxic appearing.   Substance use: occ ETOH use Allergies: NKDA Blood thinners: none Past abdominal Surgeries: lap ventral hernia repair with mesh   ROS: Reviewed and as above  Family History  Problem Relation Age of Onset   Stroke Mother    Aneurysm Mother        brain   Arthritis Father    Liver cancer Father        liver, atomic testing in military   Gallbladder disease Father    Hyperlipidemia Sister    Hypertension Sister    Obesity Sister    Diabetes Sister    Diabetes Brother    Hyperlipidemia Sister    Hypertension Sister    Obesity Sister    Diabetes Sister    Aneurysm Brother        brain   COPD Brother    Gallbladder disease Sister        x4   Colon cancer Neg Hx    Colon polyps Neg Hx    Esophageal cancer Neg Hx    Rectal cancer Neg Hx    Stomach cancer Neg Hx    Prostate cancer Neg Hx    CAD Neg Hx     Past Medical History:  Diagnosis Date   Abnormal CT scan, gastrointestinal tract suspect angioedema    Anemia 03/16/2016   IRON   Anxiety     Arthritis    Back pain 10/10/2012   Fatty liver 2005   seen on 2005 ultrasound,05/2015 CT.    Hemorrhoids 08/22/2016   Hemorrhoids, internal, with bleeding 2005   internal and external. 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   Procedure: REVERSE SHOULDER ARTHROPLASTY;  Surgeon: Cammy Copa, MD;  Location: [facility] OR;  Service: Orthopedics;  Laterality: Right;   TONSILLECTOMY  1957   TOTAL KNEE ARTHROPLASTY Right 07/24/2022   Procedure: RIGHT TOTAL KNEE ARTHROPLASTY;  Surgeon: Cammy Copa, MD;  Location: [facility] OR;  Service: Orthopedics;  Laterality: Right;   TOTAL SHOULDER ARTHROPLASTY Left 11/01/2020   Procedure: LEFT ANATOMIC SHOULDER REPLACEMENT;  Surgeon: Cammy Copa, MD;  Location: [facility] OR;  Service: Orthopedics;  Laterality: Left;   VENTRAL HERNIA REPAIR N/A  06/03/2018   Procedure: LAPAROSCOPIC VENTRAL WALL  HERNIA  REPAIR WITH MESH  ERAS PATHWAY;  Surgeon: Karie Soda, MD;  Location: [facility] ORS;  Service: General;  Laterality: N/A;    Social History:  reports that he has never smoked. He has never used smokeless tobacco. He reports that he does not currently use alcohol. He reports that he does not use drugs.  Allergies: No Known Allergies  (Not in a hospital admission)   Blood pressure (!) 151/96, pulse 91, temperature 98.2 F (36.8 C), temperature source Oral, resp. rate 18, SpO2 97%. Physical Exam: General: pleasant, WD, obese male who is ambulating in room  HEENT: head is normocephalic, atraumatic.  Sclera are noninjected.  Pupils equal and round. EOMs intact.  Ears and nose without any masses or lesions.  Mouth is pink and moist Heart: regular, rate, and rhythm.  Palpable pedal pulses bilaterally Lungs: CTAB, no wheezes, rhonchi, or rales noted.  Respiratory effort nonlabored Abd: soft, ttp in epigastric abdomen without peritonitis, moderately distended, no masses, hernias, or organomegaly MSK: all 4 extremities are symmetrical with no cyanosis, clubbing, or edema. Skin: warm and dry with no masses, lesions, or rashes Neuro: Cranial nerves 2-12 grossly intact, sensation is normal throughout Psych: A&Ox4 with an appropriate affect.    Results for orders placed or performed during the hospital encounter of 08/12/23 (from the past 48 hours)  Comprehensive metabolic panel     Status: Abnormal   Collection Time: 08/12/23  9:27 AM  Result Value Ref Range   Sodium 131 (L) 135 - 145 mmol/L   Potassium 3.2 (L) 3.5 - 5.1 mmol/L   Chloride 102 98 - 111 mmol/L   CO2 20 (L) 22 - 32 mmol/L   Glucose, Bld 126 (H) 70 - 99 mg/dL    Comment: Glucose reference range applies only to samples taken after fasting for at least 8 hours.   BUN 18 8 - 23 mg/dL   Creatinine, Ser 0.98 0.61 - 1.24 mg/dL   Calcium 8.7 (L) 8.9 - 10.3 mg/dL   Total Protein 6.7  6.5 - 8.1 g/dL   Albumin 3.5 3.5 - 5.0 g/dL   AST 20 15 - 41 U/L   ALT 23 0 - 44 U/L   Alkaline Phosphatase 49 38 - 126 U/L   Total Bilirubin 1.2 0.0 - 1.2 mg/dL   GFR, Estimated >11 >91 mL/min    Comment: (NOTE) Calculated using the CKD-EPI Creatinine Equation (2021)    Anion gap 9 5 - 15    Comment: Performed at [facility], [address] [place], [place] [address]  CBC  with Differential     Status: Abnormal   Collection Time: 08/12/23  9:27 AM  Result Value Ref Range   WBC 3.8 (L) 4.0 - 10.5 K/uL   RBC 4.53 4.22 - 5.81 MIL/uL   Hemoglobin 12.8 (L) 13.0 - 17.0 g/dL   HCT 16.1 (L) 09.6 - 04.5 %   MCV 83.0 80.0 - 100.0 fL   MCH 28.3 26.0 - 34.0 pg   MCHC 34.0 30.0 - 36.0 g/dL   RDW 40.9 81.1 - 91.4 %   Platelets 168 150 - 400 K/uL   nRBC 0.0 0.0 - 0.2 %   Neutrophils Relative % 44 %   Neutro Abs 1.7 1.7 - 7.7 K/uL   Lymphocytes Relative 34 %   Lymphs Abs 1.3 0.7 - 4.0 K/uL   Monocytes Relative 21 %   Monocytes Absolute 0.8 0.1 - 1.0 K/uL   Eosinophils Relative 0 %   Eosinophils Absolute 0.0 0.0 - 0.5 K/uL   Basophils Relative 0 %   Basophils Absolute 0.0 0.0 - 0.1 K/uL   Immature Granulocytes 1 %   Abs Immature Granulocytes 0.02 0.00 - 0.07 K/uL    Comment: Performed at [facility], [address] [place], [place] [address]  Lipase, blood     Status: None   Collection Time: 08/12/23  9:27 AM  Result Value Ref Range   Lipase 25 11 - 51 U/L    Comment: Performed at [facility], [address] [place], [place] [address]  Lactic acid, plasma     Status: None   Collection Time: 08/12/23  9:27 AM  Result Value Ref Range   Lactic Acid, Venous 1.3 0.5 - 1.9 mmol/L    Comment: Performed at [facility], [address] [place], [place] [address]  Urinalysis, Routine w reflex microscopic -Urine, Clean Catch     Status: None   Collection Time: 08/12/23  9:27 AM  Result Value Ref Range   Color, Urine YELLOW YELLOW   APPearance  CLEAR CLEAR   Specific Gravity, Urine 1.010 1.005 - 1.030   pH 6.5 5.0 - 8.0   Glucose, UA NEGATIVE NEGATIVE mg/dL   Hgb urine dipstick NEGATIVE NEGATIVE   Bilirubin Urine NEGATIVE NEGATIVE   Ketones, ur NEGATIVE NEGATIVE mg/dL   Protein, ur NEGATIVE NEGATIVE mg/dL   Nitrite NEGATIVE NEGATIVE   Leukocytes,Ua NEGATIVE NEGATIVE    Comment: Microscopic not done on urines with negative protein, blood, leukocytes, nitrite, or glucose < 500 mg/dL. Performed at [facility], [address] [place], [place] [address]    CT ABDOMEN PELVIS W CONTRAST Result Date: 08/11/2023 CLINICAL DATA:  Epigastric and mid abdominal pain, nausea and diarrhea since Sunday EXAM: CT ABDOMEN AND PELVIS WITH CONTRAST TECHNIQUE: Multidetector CT imaging of the abdomen and pelvis was performed using the standard protocol following bolus administration of intravenous contrast. RADIATION DOSE REDUCTION: This exam was performed according to the departmental dose-optimization program which includes automated exposure control, adjustment of the mA and/or kV according to patient size and/or use of iterative reconstruction technique. CONTRAST:  OMNIPAQUE IOHEXOL 300 MG/ML  SOLN COMPARISON:  07/20/2019 FINDINGS: Lower chest: No acute pleural or parenchymal lung disease. Hepatobiliary: No focal liver abnormality is seen. No gallstones, gallbladder wall thickening, or biliary dilatation. Pancreas: Unremarkable. No pancreatic ductal dilatation or surrounding inflammatory changes. Spleen: Normal in size without focal abnormality. Adrenals/Urinary Tract: Stable bilateral renal cortical cysts. No specific follow-up recommended. No urinary  tract calculi or obstructive uropathy within either kidney. The adrenals and bladder are unremarkable. Stomach/Bowel: There is segmental dilation of the mid to distal jejunum within the left mid abdomen, measuring up to 4.4 cm and maximal diameter and with multiple gas fluid levels identified.  The proximal jejunum and distal ileum are normal in caliber. Findings are consistent with small-bowel obstruction versus long segmental ileus. Given the normal caliber of the proximal jejunum and distal ileum, developing closed loop obstruction could be considered. Sigmoid diverticulosis without diverticulitis. Normal appendix right lower quadrant. Vascular/Lymphatic: Aortic atherosclerosis. No enlarged abdominal or pelvic lymph nodes. Reproductive: Prostate is enlarged. Other: Trace pelvic free fluid. Central mesenteric edema. No free intraperitoneal gas. No abdominal wall hernia. Musculoskeletal: No acute or destructive bony abnormalities. Reconstructed images demonstrate no additional findings. IMPRESSION: 1. Segmental dilation of the mid and distal jejunum, favor developing small bowel obstruction over segmental ileus. Given the normal caliber of the proximal jejunum and distal ileum, closed loop obstruction could be considered. 2. Stable Bosniak category 1 and 2 renal cysts. No specific imaging follow-up is recommended. 3. Trace pelvic free fluid and central mesenteric edema as above. 4. Sigmoid diverticulosis without diverticulitis. 5. Enlarged prostate. These results will be called to the ordering clinician or representative by the Radiologist Assistant, and communication documented in the PACS or [employer]. Electronically Signed   By: Sharlet Salina M.D.   On: 08/11/2023 20:02      Assessment/Plan pSBO vs ?enteritis H/o lap ventral hernia repair with mesh 2019 Dr. Michaell Cowing - CT with Segmental dilation of the mid and distal jejunum, favor developing small bowel obstruction over segmental ileus. Given the normal caliber of the proximal jejunum and distal ileum, closed loop obstruction could be considered. - No current indication for emergency surgery - not actively nauseated or vomiting at this time. No NGT indicated currently - will confirm with surgical attending but likely can have CLD -  Keep K > 4 and Mg > 2 for bowel function - Mobilize as tolerated - recommend GI pathogen panel given ongoing watery stools and hx consistent with viral enteritis  - Hopefully patient will improve with conservative management. If patient fails to improve with conservative management, he may require exploratory surgery during admission - Agree with medical admission. We will follow with you.   FEN: NPO, IVF per [facility] ID: no current abx, no leukocytosis, afebrile  VTE: okay for chemical prophylaxis from surgical standpoint  I reviewed ED provider notes, last 24 h vitals and pain scores, last 48 h intake and output, last 24 h labs and trends, and last 24 h imaging results.   Trixie Deis, [facility] 08/12/2023, 11:40 AM Please see Amion for pager number during day hours 7:00am-4:30pm

## 2023-08-12 NOTE — Plan of Care (Signed)

## 2023-08-12 NOTE — ED Provider Notes (Signed)
[facility] Provider Note   CSN: [id number] Arrival date & time: 08/12/23  4010     History  Chief Complaint  Patient presents with   Abdominal Pain    [name] is a 78 y.o. male.  With a history of small bowel obstruction, hypertension and constipation who presents to the ED for abdominal pain.  Patient has experienced now 3 days of ongoing abdominal pain localized over the epigastrium with some associated nausea and diarrhea.  Has had some watery bowel movements over the last couple days but last solid bowel movement was 3 nights ago.  Was seen by his PCP yesterday for this reason and she obtained blood work and had an outpatient CT taken last night.  CT showed question of early SBO and he was directed here for further evaluation.  Still having abdominal pain but no nausea.  Has a history of bowel obstruction requiring admission but has never had surgery for this reason or other abdominal surgeries/procedures.  No chest pain shortness of breath at this time   Abdominal Pain      Home Medications Prior to Admission medications   Medication Sig Start Date End Date Taking? Authorizing Provider  acetaminophen (TYLENOL) 325 MG tablet Take 1-2 tablets (325-650 mg total) by mouth every 6 (six) hours as needed for mild pain (pain score 1-3 or temp > 100.5). 03/13/23   Cammy Copa, MD  ALPRAZolam Prudy Feeler) 1 MG tablet TAKE 1 TABLET (1 MG TOTAL) BY MOUTH AT BEDTIME AS NEEDED FOR ANXIETY. 05/14/23   Bradd Canary, MD  amLODipine (NORVASC) 10 MG tablet TAKE 1 TABLET DAILY 07/06/23   Bradd Canary, MD  aspirin 81 MG chewable tablet Chew 1 tablet (81 mg total) by mouth 2 (two) times daily. 07/25/22   Magnant, Charles L, PA-C  gabapentin (NEURONTIN) 100 MG capsule Take 1 capsule (100 mg total) by mouth 2 (two) times daily. 04/02/23   Magnant, Charles L, PA-C  Iron, Ferrous Sulfate, 325 (65 Fe) MG TABS Take 325 mg by mouth daily. 11/12/22    Bradd Canary, MD  losartan (COZAAR) 50 MG tablet Take 1 tablet (50 mg total) by mouth daily. 05/14/23   Bradd Canary, MD  methocarbamol (ROBAXIN) 500 MG tablet Take 1 tablet (500 mg total) by mouth every 8 (eight) hours as needed for muscle spasms. 03/13/23   Cammy Copa, MD  metoprolol succinate (TOPROL-XL) 50 MG 24 hr tablet TAKE AS INSTRUCTED BY YOUR PRESCRIBER Patient taking differently: Take 50 mg by mouth daily. 01/12/23   Bradd Canary, MD  Multiple Vitamins-Minerals (MULTIVITAMIN ADULT PO) Take 2 tablets by mouth daily.    [provider]  rosuvastatin (CRESTOR) 20 MG tablet Take 1 tablet (20 mg total) by mouth daily. 10/22/22   Bradd Canary, MD  tamsulosin (FLOMAX) 0.4 MG CAPS capsule Take 0.4 mg by mouth daily. 03/26/21   [provider]  triamcinolone cream (KENALOG) 0.1 % Apply 1 Application topically 2 (two) times daily. 04/06/23   Alfredia Ferguson, PA-C      Allergies    Patient has no known allergies.    Review of Systems   Review of Systems  Gastrointestinal:  Positive for abdominal pain.    Physical Exam Updated Vital Signs BP 130/88   Pulse 88   Temp 98.2 F (36.8 C) (Oral)   Resp 16   SpO2 100%  Physical Exam Vitals and nursing note reviewed.  HENT:  Head: Normocephalic and atraumatic.  Eyes:     Pupils: Pupils are equal, round, and reactive to light.  Cardiovascular:     Rate and Rhythm: Normal rate and regular rhythm.  Pulmonary:     Effort: Pulmonary effort is normal.     Breath sounds: Normal breath sounds.  Abdominal:     General: Bowel sounds are normal.     Palpations: Abdomen is soft.     Tenderness: There is abdominal tenderness in the epigastric area. There is no guarding or rebound.  Skin:    General: Skin is warm and dry.  Neurological:     Mental Status: He is alert.  Psychiatric:        Mood and Affect: Mood normal.     ED Results / Procedures / Treatments   Labs (all labs ordered are listed, but  only abnormal results are displayed) Labs Reviewed  COMPREHENSIVE METABOLIC PANEL - Abnormal; Notable for the following components:      Result Value   Sodium 131 (*)    Potassium 3.2 (*)    CO2 20 (*)    Glucose, Bld 126 (*)    Calcium 8.7 (*)    All other components within normal limits  CBC WITH DIFFERENTIAL/PLATELET - Abnormal; Notable for the following components:   WBC 3.8 (*)    Hemoglobin 12.8 (*)    HCT 37.6 (*)    All other components within normal limits  LIPASE, BLOOD  LACTIC ACID, PLASMA  URINALYSIS, ROUTINE W REFLEX MICROSCOPIC  LACTIC ACID, PLASMA    EKG None  Radiology CT ABDOMEN PELVIS W CONTRAST Result Date: 08/11/2023 CLINICAL DATA:  Epigastric and mid abdominal pain, nausea and diarrhea since Sunday EXAM: CT ABDOMEN AND PELVIS WITH CONTRAST TECHNIQUE: Multidetector CT imaging of the abdomen and pelvis was performed using the standard protocol following bolus administration of intravenous contrast. RADIATION DOSE REDUCTION: This exam was performed according to the departmental dose-optimization program which includes automated exposure control, adjustment of the mA and/or kV according to patient size and/or use of iterative reconstruction technique. CONTRAST:  OMNIPAQUE IOHEXOL 300 MG/ML  SOLN COMPARISON:  07/20/2019 FINDINGS: Lower chest: No acute pleural or parenchymal lung disease. Hepatobiliary: No focal liver abnormality is seen. No gallstones, gallbladder wall thickening, or biliary dilatation. Pancreas: Unremarkable. No pancreatic ductal dilatation or surrounding inflammatory changes. Spleen: Normal in size without focal abnormality. Adrenals/Urinary Tract: Stable bilateral renal cortical cysts. No specific follow-up recommended. No urinary tract calculi or obstructive uropathy within either kidney. The adrenals and bladder are unremarkable. Stomach/Bowel: There is segmental dilation of the mid to distal jejunum within the left mid abdomen, measuring up to  4.4 cm and maximal diameter and with multiple gas fluid levels identified. The proximal jejunum and distal ileum are normal in caliber. Findings are consistent with small-bowel obstruction versus long segmental ileus. Given the normal caliber of the proximal jejunum and distal ileum, developing closed loop obstruction could be considered. Sigmoid diverticulosis without diverticulitis. Normal appendix right lower quadrant. Vascular/Lymphatic: Aortic atherosclerosis. No enlarged abdominal or pelvic lymph nodes. Reproductive: Prostate is enlarged. Other: Trace pelvic free fluid. Central mesenteric edema. No free intraperitoneal gas. No abdominal wall hernia. Musculoskeletal: No acute or destructive bony abnormalities. Reconstructed images demonstrate no additional findings. IMPRESSION: 1. Segmental dilation of the mid and distal jejunum, favor developing small bowel obstruction over segmental ileus. Given the normal caliber of the proximal jejunum and distal ileum, closed loop obstruction could be considered. 2. Stable Bosniak category 1 and 2  renal cysts. No specific imaging follow-up is recommended. 3. Trace pelvic free fluid and central mesenteric edema as above. 4. Sigmoid diverticulosis without diverticulitis. 5. Enlarged prostate. These results will be called to the ordering clinician or representative by the Radiologist Assistant, and communication documented in the PACS or [employer]. Electronically Signed   By: Sharlet Salina M.D.   On: 08/11/2023 20:02    Procedures Procedures    Medications Ordered in ED Medications  0.9 %  sodium chloride infusion ( Intravenous New Bag/Given 08/12/23 1158)  sodium chloride 0.9 % bolus 1,000 mL (0 mLs Intravenous Stopped 08/12/23 1059)  morphine (PF) 4 MG/ML injection 4 mg (4 mg Intravenous Given 08/12/23 1056)  ondansetron (ZOFRAN) injection 4 mg (4 mg Intravenous Given 08/12/23 1056)    ED Course/ Medical Decision Making/ A&P Clinical Course as of  08/12/23 1158  Wed Aug 12, 2023  1139 Laboratory workup notable for slight hyponatremia and hypokalemia.  No leukocytosis or elevated venous lactate.  UA is negative.  Patient comfortable at this time after morphine Zofran and fluids.  Will keep him n.p.o.  Discussed with surgical PA on-call who will evaluate patient during his admission.  She agrees with plan for conservative management at this time.  Paged hospitalist [MP]  1158 Discussed admitting hospitalist who accepts patient for admission [MP]    Clinical Course User Index [MP] Royanne Foots, DO                                 Medical Decision Making 78 year old male with history as above presenting per recommendation of PCP given finding of early SBO on CT.  3 days of abdominal pain with some nausea and diarrhea.  Last solid bowel movement 3 days ago.  Epigastric tenderness on exam.  Afebrile and slightly hypertensive here.  Still having a good deal of discomfort 6-8 out of severity.  Will obtain laboratory workup here.  Considering advanced age, risk factors and discomfort admission likely for SBO.  I reviewed the CT report from CT abdomen pelvis taken last night as outpatient.  No other acute intra-abdominal pathologies.  See below  CT abdomen pelvis impression IMPRESSION: 1. Segmental dilation of the mid and distal jejunum, favor developing small bowel obstruction over segmental ileus. Given the normal caliber of the proximal jejunum and distal ileum, closed loop obstruction could be considered. 2. Stable Bosniak category 1 and 2 renal cysts. No specific imaging follow-up is recommended. 3. Trace pelvic free fluid and central mesenteric edema as above. 4. Sigmoid diverticulosis without diverticulitis. 5. Enlarged prostate.   Amount and/or Complexity of Data Reviewed Labs: ordered.  Risk Prescription drug management. Decision regarding hospitalization.           Final Clinical Impression(s) / ED Diagnoses Final  diagnoses:  Small bowel obstruction [facility])    Rx / DC Orders ED Discharge Orders     None         Royanne Foots, DO 08/12/23 1158

## 2023-08-12 NOTE — Telephone Encounter (Signed)
Can you please try to reach out to patient again- it doesn't look like he went to the ED.

## 2023-08-13 ENCOUNTER — Observation Stay (HOSPITAL_COMMUNITY): Payer: Medicare Other

## 2023-08-13 DIAGNOSIS — N281 Cyst of kidney, acquired: Secondary | ICD-10-CM | POA: Diagnosis not present

## 2023-08-13 DIAGNOSIS — R933 Abnormal findings on diagnostic imaging of other parts of digestive tract: Secondary | ICD-10-CM | POA: Diagnosis not present

## 2023-08-13 DIAGNOSIS — E878 Other disorders of electrolyte and fluid balance, not elsewhere classified: Secondary | ICD-10-CM | POA: Diagnosis not present

## 2023-08-13 DIAGNOSIS — E440 Moderate protein-calorie malnutrition: Secondary | ICD-10-CM | POA: Diagnosis present

## 2023-08-13 DIAGNOSIS — R140 Abdominal distension (gaseous): Secondary | ICD-10-CM | POA: Diagnosis not present

## 2023-08-13 DIAGNOSIS — K5669 Other partial intestinal obstruction: Secondary | ICD-10-CM | POA: Diagnosis present

## 2023-08-13 DIAGNOSIS — Z96611 Presence of right artificial shoulder joint: Secondary | ICD-10-CM | POA: Diagnosis present

## 2023-08-13 DIAGNOSIS — K658 Other peritonitis: Secondary | ICD-10-CM | POA: Diagnosis present

## 2023-08-13 DIAGNOSIS — D6489 Other specified anemias: Secondary | ICD-10-CM | POA: Diagnosis present

## 2023-08-13 DIAGNOSIS — D649 Anemia, unspecified: Secondary | ICD-10-CM | POA: Diagnosis not present

## 2023-08-13 DIAGNOSIS — Z96612 Presence of left artificial shoulder joint: Secondary | ICD-10-CM | POA: Diagnosis present

## 2023-08-13 DIAGNOSIS — K635 Polyp of colon: Secondary | ICD-10-CM | POA: Diagnosis not present

## 2023-08-13 DIAGNOSIS — E66811 Obesity, class 1: Secondary | ICD-10-CM | POA: Diagnosis present

## 2023-08-13 DIAGNOSIS — N289 Disorder of kidney and ureter, unspecified: Secondary | ICD-10-CM | POA: Diagnosis not present

## 2023-08-13 DIAGNOSIS — E871 Hypo-osmolality and hyponatremia: Secondary | ICD-10-CM | POA: Diagnosis present

## 2023-08-13 DIAGNOSIS — K6389 Other specified diseases of intestine: Secondary | ICD-10-CM | POA: Diagnosis not present

## 2023-08-13 DIAGNOSIS — Z8249 Family history of ischemic heart disease and other diseases of the circulatory system: Secondary | ICD-10-CM | POA: Diagnosis not present

## 2023-08-13 DIAGNOSIS — A0811 Acute gastroenteropathy due to Norwalk agent: Secondary | ICD-10-CM | POA: Diagnosis present

## 2023-08-13 DIAGNOSIS — K5000 Crohn's disease of small intestine without complications: Secondary | ICD-10-CM | POA: Diagnosis not present

## 2023-08-13 DIAGNOSIS — Z825 Family history of asthma and other chronic lower respiratory diseases: Secondary | ICD-10-CM | POA: Diagnosis not present

## 2023-08-13 DIAGNOSIS — J189 Pneumonia, unspecified organism: Secondary | ICD-10-CM | POA: Diagnosis not present

## 2023-08-13 DIAGNOSIS — E785 Hyperlipidemia, unspecified: Secondary | ICD-10-CM | POA: Diagnosis present

## 2023-08-13 DIAGNOSIS — K567 Ileus, unspecified: Secondary | ICD-10-CM | POA: Diagnosis not present

## 2023-08-13 DIAGNOSIS — K56609 Unspecified intestinal obstruction, unspecified as to partial versus complete obstruction: Secondary | ICD-10-CM | POA: Diagnosis not present

## 2023-08-13 DIAGNOSIS — I10 Essential (primary) hypertension: Secondary | ICD-10-CM | POA: Diagnosis present

## 2023-08-13 DIAGNOSIS — K30 Functional dyspepsia: Secondary | ICD-10-CM | POA: Diagnosis not present

## 2023-08-13 DIAGNOSIS — K56699 Other intestinal obstruction unspecified as to partial versus complete obstruction: Secondary | ICD-10-CM | POA: Diagnosis not present

## 2023-08-13 DIAGNOSIS — Z833 Family history of diabetes mellitus: Secondary | ICD-10-CM | POA: Diagnosis not present

## 2023-08-13 DIAGNOSIS — E876 Hypokalemia: Secondary | ICD-10-CM | POA: Diagnosis present

## 2023-08-13 DIAGNOSIS — R197 Diarrhea, unspecified: Secondary | ICD-10-CM | POA: Diagnosis not present

## 2023-08-13 DIAGNOSIS — F419 Anxiety disorder, unspecified: Secondary | ICD-10-CM | POA: Diagnosis not present

## 2023-08-13 DIAGNOSIS — A419 Sepsis, unspecified organism: Secondary | ICD-10-CM | POA: Diagnosis not present

## 2023-08-13 DIAGNOSIS — D62 Acute posthemorrhagic anemia: Secondary | ICD-10-CM | POA: Diagnosis not present

## 2023-08-13 DIAGNOSIS — S31109A Unspecified open wound of abdominal wall, unspecified quadrant without penetration into peritoneal cavity, initial encounter: Secondary | ICD-10-CM | POA: Diagnosis not present

## 2023-08-13 DIAGNOSIS — N179 Acute kidney failure, unspecified: Secondary | ICD-10-CM | POA: Diagnosis not present

## 2023-08-13 DIAGNOSIS — E8809 Other disorders of plasma-protein metabolism, not elsewhere classified: Secondary | ICD-10-CM | POA: Diagnosis not present

## 2023-08-13 DIAGNOSIS — K668 Other specified disorders of peritoneum: Secondary | ICD-10-CM | POA: Diagnosis not present

## 2023-08-13 DIAGNOSIS — K689 Other disorders of retroperitoneum: Secondary | ICD-10-CM | POA: Diagnosis not present

## 2023-08-13 DIAGNOSIS — Z4682 Encounter for fitting and adjustment of non-vascular catheter: Secondary | ICD-10-CM | POA: Diagnosis not present

## 2023-08-13 DIAGNOSIS — K56600 Partial intestinal obstruction, unspecified as to cause: Secondary | ICD-10-CM | POA: Diagnosis not present

## 2023-08-13 DIAGNOSIS — Z96651 Presence of right artificial knee joint: Secondary | ICD-10-CM | POA: Diagnosis present

## 2023-08-13 DIAGNOSIS — R188 Other ascites: Secondary | ICD-10-CM | POA: Diagnosis not present

## 2023-08-13 DIAGNOSIS — R110 Nausea: Secondary | ICD-10-CM | POA: Diagnosis not present

## 2023-08-13 DIAGNOSIS — R109 Unspecified abdominal pain: Secondary | ICD-10-CM | POA: Diagnosis not present

## 2023-08-13 DIAGNOSIS — K633 Ulcer of intestine: Secondary | ICD-10-CM | POA: Diagnosis not present

## 2023-08-13 DIAGNOSIS — N400 Enlarged prostate without lower urinary tract symptoms: Secondary | ICD-10-CM | POA: Diagnosis present

## 2023-08-13 DIAGNOSIS — R112 Nausea with vomiting, unspecified: Secondary | ICD-10-CM | POA: Diagnosis not present

## 2023-08-13 DIAGNOSIS — K5730 Diverticulosis of large intestine without perforation or abscess without bleeding: Secondary | ICD-10-CM | POA: Diagnosis not present

## 2023-08-13 DIAGNOSIS — K631 Perforation of intestine (nontraumatic): Secondary | ICD-10-CM | POA: Diagnosis present

## 2023-08-13 DIAGNOSIS — R1084 Generalized abdominal pain: Secondary | ICD-10-CM | POA: Diagnosis not present

## 2023-08-13 DIAGNOSIS — Z79899 Other long term (current) drug therapy: Secondary | ICD-10-CM | POA: Diagnosis not present

## 2023-08-13 LAB — CBC
HCT: 38.2 % — ABNORMAL LOW (ref 39.0–52.0)
Hemoglobin: 13 g/dL (ref 13.0–17.0)
MCH: 28.3 pg (ref 26.0–34.0)
MCHC: 34 g/dL (ref 30.0–36.0)
MCV: 83.2 fL (ref 80.0–100.0)
Platelets: 181 10*3/uL (ref 150–400)
RBC: 4.59 MIL/uL (ref 4.22–5.81)
RDW: 13.2 % (ref 11.5–15.5)
WBC: 3.2 10*3/uL — ABNORMAL LOW (ref 4.0–10.5)
nRBC: 0 % (ref 0.0–0.2)

## 2023-08-13 LAB — BASIC METABOLIC PANEL
Anion gap: 12 (ref 5–15)
BUN: 11 mg/dL (ref 8–23)
CO2: 23 mmol/L (ref 22–32)
Calcium: 9 mg/dL (ref 8.9–10.3)
Chloride: 100 mmol/L (ref 98–111)
Creatinine, Ser: 0.93 mg/dL (ref 0.61–1.24)
GFR, Estimated: >60 mL/min (ref >60)
Glucose, Bld: 121 mg/dL — ABNORMAL HIGH (ref 70–99)
Potassium: 3.8 mmol/L (ref 3.5–5.1)
Sodium: 135 mmol/L (ref 135–145)

## 2023-08-13 LAB — MAGNESIUM: Magnesium: 1.6 mg/dL — ABNORMAL LOW (ref 1.7–2.4)

## 2023-08-13 MED ORDER — MAGNESIUM SULFATE 2 GM/50ML IV SOLN
2.0000 g | Freq: Once | INTRAVENOUS | Status: AC
Start: 2023-08-13 — End: 2023-08-13
  Administered 2023-08-13: 2 g via INTRAVENOUS
  Filled 2023-08-13: qty 50

## 2023-08-13 MED ORDER — TRAMADOL HCL 50 MG PO TABS
50.0000 mg | ORAL_TABLET | Freq: Four times a day (QID) | ORAL | Status: DC | PRN
  Administered 2023-08-13 – 2023-08-15 (×5): 50 mg via ORAL
  Filled 2023-08-13 (×5): qty 1

## 2023-08-13 MED ORDER — ACETAMINOPHEN 650 MG RE SUPP: 650.0000 mg | Freq: Four times a day (QID) | RECTAL | Status: DC | PRN

## 2023-08-13 MED ORDER — ACETAMINOPHEN 500 MG PO TABS
1000.0000 mg | ORAL_TABLET | Freq: Four times a day (QID) | ORAL | Status: DC | PRN
  Administered 2023-08-13 – 2023-08-20 (×3): 1000 mg via ORAL
  Filled 2023-08-13 (×3): qty 2

## 2023-08-13 NOTE — Care Management Obs Status (Signed)
MEDICARE OBSERVATION STATUS NOTIFICATION   Patient Details  Name: [name] MRN: [id number] Date of Birth: 1946/05/29   Medicare Observation Status Notification Given:  Yes    Lockie Pares, RN 08/13/2023, 10:06 AM

## 2023-08-13 NOTE — Care Management Obs Status (Cosign Needed)
MEDICARE OBSERVATION STATUS NOTIFICATION   Patient Details  Name: [name] MRN: [id number] Date of Birth: 05-23-1946   Medicare Observation Status Notification Given:  Yes    Janae Bridgeman, RN 08/13/2023, 10:17 AM

## 2023-08-13 NOTE — Progress Notes (Signed)
Transition of Care [facility] - Inpatient Brief Assessment   Patient Details  Name: [name] MRN: [id number] Date of Birth: 10-03-1945  Transition of [facility]) CM/SW Contact:    Janae Bridgeman, RN Phone Number: 08/13/2023, 11:12 AM   Clinical Narrative: CM met with the patient at the bedside and copy of the [facility] letter was provided to the patient at the bedside.  Patient admitted to the hospital for Ileus and diet is advancing at this time. Patient has recently had watery stool  Patient is independent and plans to return home with wife when stable for discharge.  The patient's wife will be providing transportation to home via car.   Transition of Care Asessment: Insurance and Status: (P) Insurance coverage has been reviewed Patient has primary care physician: (P) Yes Home environment has been reviewed: (P) from home with wife Prior level of function:: (P) Independent Prior/Current Home Services: (P) No current home services Social Drivers of Health Review: (P) SDOH reviewed interventions complete Readmission risk has been reviewed: (P) Yes Transition of care needs: (P) transition of care needs identified, TOC will continue to follow

## 2023-08-13 NOTE — Progress Notes (Addendum)
Subjective: Feels bloated.  Had some nausea last night after CLD.  Still passing flatus and having watery BMs.  still with some abdominal discomfort  ROS: See above, otherwise other systems negative  Objective: Vital signs in last 24 hours: Temp:  [98.2 F (36.8 C)-99.4 F (37.4 C)] 99.4 F (37.4 C) (02/12 2021) Pulse Rate:  [79-100] 80 (02/13 0448) Resp:  [16-20] 20 (02/13 0448) BP: (118-151)/(85-96) 135/85 (02/13 0448) SpO2:  [96 %-100 %] 100 % (02/13 0448) Last BM Date : 08/12/23  Intake/Output from previous day: 02/12 0701 - 02/13 0700 In: 969.4 [IV Piggyback:969.4] Out: -  Intake/Output this shift: No intake/output data recorded.  PE: Gen: NAD Abd: some distention, some centralized tenderness to palpation, no peritonitis  Lab Results:  Recent Labs    08/12/23 0927 08/13/23 0625  WBC 3.8* 3.2*  HGB 12.8* 13.0  HCT 37.6* 38.2*  PLT 168 181   BMET Recent Labs    08/12/23 0927 08/13/23 0625  NA 131* 135  K 3.2* 3.8  CL 102 100  CO2 20* 23  GLUCOSE 126* 121*  BUN 18 11  CREATININE 1.12 0.93  CALCIUM 8.7* 9.0   PT/INR No results for input(s): "LABPROT", "INR" in the last 72 hours. CMP     Component Value Date/Time   NA 135 08/13/2023 0625   K 3.8 08/13/2023 0625   CL 100 08/13/2023 0625   CO2 23 08/13/2023 0625   GLUCOSE 121 (H) 08/13/2023 0625   BUN 11 08/13/2023 0625   CREATININE 0.93 08/13/2023 0625   CREATININE 1.15 02/06/2014 0816   CALCIUM 9.0 08/13/2023 0625   PROT 6.7 08/12/2023 0927   ALBUMIN 3.5 08/12/2023 0927   AST 20 08/12/2023 0927   ALT 23 08/12/2023 0927   ALKPHOS 49 08/12/2023 0927   BILITOT 1.2 08/12/2023 0927   GFRNONAA >60 08/13/2023 0625   GFRAA >60 05/24/2018 0952   Lipase     Component Value Date/Time   LIPASE 25 08/12/2023 0927       Studies/Results: CT ABDOMEN PELVIS W CONTRAST Result Date: 08/11/2023 CLINICAL DATA:  Epigastric and mid abdominal pain, nausea and diarrhea since Sunday EXAM: CT  ABDOMEN AND PELVIS WITH CONTRAST TECHNIQUE: Multidetector CT imaging of the abdomen and pelvis was performed using the standard protocol following bolus administration of intravenous contrast. RADIATION DOSE REDUCTION: This exam was performed according to the departmental dose-optimization program which includes automated exposure control, adjustment of the mA and/or kV according to patient size and/or use of iterative reconstruction technique. CONTRAST:  OMNIPAQUE IOHEXOL 300 MG/ML  SOLN COMPARISON:  07/20/2019 FINDINGS: Lower chest: No acute pleural or parenchymal lung disease. Hepatobiliary: No focal liver abnormality is seen. No gallstones, gallbladder wall thickening, or biliary dilatation. Pancreas: Unremarkable. No pancreatic ductal dilatation or surrounding inflammatory changes. Spleen: Normal in size without focal abnormality. Adrenals/Urinary Tract: Stable bilateral renal cortical cysts. No specific follow-up recommended. No urinary tract calculi or obstructive uropathy within either kidney. The adrenals and bladder are unremarkable. Stomach/Bowel: There is segmental dilation of the mid to distal jejunum within the left mid abdomen, measuring up to 4.4 cm and maximal diameter and with multiple gas fluid levels identified. The proximal jejunum and distal ileum are normal in caliber. Findings are consistent with small-bowel obstruction versus long segmental ileus. Given the normal caliber of the proximal jejunum and distal ileum, developing closed loop obstruction could be considered. Sigmoid diverticulosis without diverticulitis. Normal appendix right lower quadrant. Vascular/Lymphatic: Aortic atherosclerosis. No enlarged  abdominal or pelvic lymph nodes. Reproductive: Prostate is enlarged. Other: Trace pelvic free fluid. Central mesenteric edema. No free intraperitoneal gas. No abdominal wall hernia. Musculoskeletal: No acute or destructive bony abnormalities. Reconstructed images demonstrate no  additional findings. IMPRESSION: 1. Segmental dilation of the mid and distal jejunum, favor developing small bowel obstruction over segmental ileus. Given the normal caliber of the proximal jejunum and distal ileum, closed loop obstruction could be considered. 2. Stable Bosniak category 1 and 2 renal cysts. No specific imaging follow-up is recommended. 3. Trace pelvic free fluid and central mesenteric edema as above. 4. Sigmoid diverticulosis without diverticulitis. 5. Enlarged prostate. These results will be called to the ordering clinician or representative by the Radiologist Assistant, and communication documented in the PACS or [employer]. Electronically Signed   By: Sharlet Salina M.D.   On: 08/11/2023 20:02    Anti-infectives: Anti-infectives (From admission, onward)    None        Assessment/Plan Psbo vs enteritis -given WBC 3 today, suspect this is truly a viral enteritis, but GI pathogen panel is pending still -if that is the case, it may take his symptoms several days to completely resolve -given some nausea and belching this morning, will order new plain film just to assure no significant gastric dilatation or need for NGT etc. -if ok, will allow FLD and this can be advanced as tolerates. -no surgical indications.  -did discuss trying to use tylenol for pain as oxy can slow gut motility and that is not what we want at this time.    ADDENDUM: X-ray with some dilated SB and air throughout the colon.  His distention is secondary to this.  Will try FLD and see how he does.  FEN - FLD VTE - lovenox ID - none currently needed  I reviewed hospitalist notes, last 24 h vitals and pain scores, last 48 h intake and output, last 24 h labs and trends, and last 24 h imaging results.   LOS: 0 days    Letha Cape , [facility] 08/13/2023, 8:12 AM Please see Amion for pager number during day hours 7:00am-4:30pm or 7:00am -11:30am on weekends

## 2023-08-13 NOTE — Progress Notes (Addendum)
  Progress Note   Patient: [name] UJW:[id number] DOB: 05-21-1946 DOA: 08/12/2023     0 DOS: the patient was seen and examined on 08/13/2023   Brief hospital course:  Assessment and Plan: Likely enteritis  Possible SBO  Norovirus  Patient with positive norovirus on GI pathogen panel.  General surgery following.  Continue with supportive care. -Continue with full liquid diet  Leukopenia   Likely in setting of his acute infection. -Will continue to monitor  Hypertension Mostly normotensive Continue patient's home metoprolol  Hypomagnesemia  Will supplement.   Hyperlipidemia Continue home Crestor  Hypokalemia Resolved on a.m. labs  Hyponatremia  In the setting of  likely decreased oral intake and loose stools. Resolved on AM labs.      Subjective: Patient has some abdominal distention.  He is able to eat and drink without mild nausea but no vomiting.  Is having some loose stools.  Physical Exam: Vitals:   08/12/23 2021 08/13/23 0448 08/13/23 0816 08/13/23 1647  BP: 118/85 135/85 128/83 (!) 140/93  Pulse: 85 80 83 77  Resp: 20 20 18 18   Temp: 99.4 F (37.4 C)  98.7 F (37.1 C) 98 F (36.7 C)  TempSrc: Oral     SpO2: 100% 100% 99% 100%   Physical Exam  Constitutional: In no distress.  Cardiovascular: Normal rate, regular rhythm. No lower extremity edema  Pulmonary: Non labored breathing on room air, no wheezing or rales.   Abdominal: Soft. Normal bowel sounds. distended and non tender Musculoskeletal: Normal range of motion.     Neurological: Alert and oriented to person, place, and time. Non focal  Skin: Skin is warm and dry.   Data Reviewed:  I have reviewed all labs and images    Latest Ref Rng & Units 08/13/2023    6:25 AM 08/12/2023    9:27 AM 08/11/2023   10:46 AM  CBC  WBC 4.0 - 10.5 K/uL 3.2  3.8  5.6   Hemoglobin 13.0 - 17.0 g/dL 56.2  13.0  86.5   Hematocrit 39.0 - 52.0 % 38.2  37.6  42.7   Platelets 150 - 400 K/uL 181  168  175.0       Family Communication: None at bedside  Disposition: Status is: Inpatient Remains inpatient appropriate because: Management of ileus  Planned Discharge Destination:  Pending clinical course     Time spent: 35 minutes  Author: Marolyn Haller, MD 08/13/2023 6:15 PM  For on call review [email or web address].

## 2023-08-13 NOTE — Plan of Care (Signed)

## 2023-08-14 DIAGNOSIS — K567 Ileus, unspecified: Secondary | ICD-10-CM | POA: Diagnosis not present

## 2023-08-14 DIAGNOSIS — K56600 Partial intestinal obstruction, unspecified as to cause: Secondary | ICD-10-CM | POA: Diagnosis not present

## 2023-08-14 DIAGNOSIS — A0811 Acute gastroenteropathy due to Norwalk agent: Secondary | ICD-10-CM | POA: Diagnosis not present

## 2023-08-14 DIAGNOSIS — E876 Hypokalemia: Secondary | ICD-10-CM | POA: Diagnosis not present

## 2023-08-14 DIAGNOSIS — E871 Hypo-osmolality and hyponatremia: Secondary | ICD-10-CM

## 2023-08-14 DIAGNOSIS — I10 Essential (primary) hypertension: Secondary | ICD-10-CM

## 2023-08-14 LAB — GASTROINTESTINAL PANEL BY PCR, STOOL (REPLACES STOOL CULTURE)

## 2023-08-14 LAB — BASIC METABOLIC PANEL
Anion gap: 11 (ref 5–15)
BUN: 13 mg/dL (ref 8–23)
CO2: 22 mmol/L (ref 22–32)
Calcium: 8.5 mg/dL — ABNORMAL LOW (ref 8.9–10.3)
Chloride: 98 mmol/L (ref 98–111)
Creatinine, Ser: 1.02 mg/dL (ref 0.61–1.24)
GFR, Estimated: >60 mL/min (ref >60)
Glucose, Bld: 97 mg/dL (ref 70–99)
Potassium: 4.1 mmol/L (ref 3.5–5.1)
Sodium: 131 mmol/L — ABNORMAL LOW (ref 135–145)

## 2023-08-14 LAB — CBC
HCT: 33.7 % — ABNORMAL LOW (ref 39.0–52.0)
Hemoglobin: 11.4 g/dL — ABNORMAL LOW (ref 13.0–17.0)
MCH: 28.3 pg (ref 26.0–34.0)
MCHC: 33.8 g/dL (ref 30.0–36.0)
MCV: 83.6 fL (ref 80.0–100.0)
Platelets: 180 10*3/uL (ref 150–400)
RBC: 4.03 MIL/uL — ABNORMAL LOW (ref 4.22–5.81)
RDW: 13.2 % (ref 11.5–15.5)
WBC: 3.7 10*3/uL — ABNORMAL LOW (ref 4.0–10.5)
nRBC: 0 % (ref 0.0–0.2)

## 2023-08-14 LAB — PHOSPHORUS: Phosphorus: 2.1 mg/dL — ABNORMAL LOW (ref 2.5–4.6)

## 2023-08-14 LAB — MAGNESIUM: Magnesium: 1.9 mg/dL (ref 1.7–2.4)

## 2023-08-14 MED ORDER — ALUM & MAG HYDROXIDE-SIMETH 200-200-20 MG/5ML PO SUSP
30.0000 mL | Freq: Four times a day (QID) | ORAL | Status: DC
  Administered 2023-08-14 – 2023-08-17 (×13): 30 mL via ORAL
  Filled 2023-08-14 (×13): qty 30

## 2023-08-14 NOTE — Progress Notes (Signed)
Progress Note   Patient: [name] ZOX:[id number] DOB: 28-Jul-1945 DOA: 08/12/2023     1 DOS: the patient was seen and examined on 08/14/2023   Brief hospital course: [name] is a 78 y.o. male with medical history significant of HTN, HLD, hx of SBO presenting to the ED with abdominal pain. Patient reports that he has been having abdominal pain since Monday. Abdominal pain is periumbilical and does not radiate anywhere else. He had normal fully-formed BMs on Sunday, but on Monday began noticing very loose stools. Reports going about 5-6 times per day since Monday. Does endorse some associated dry heaving, but otherwise no vomiting.   Patient is admitted to the hospitalist service for evaluation of partial small bowel obstruction versus enteritis.  GI panel came back positive for norovirus.  Patient is currently able to tolerate full liquid diet, on conservative management, surgery team following.  Assessment and Plan: Possible partial small bowel obstruction. Norovirus enteritis Continue supportive care. Patient is able to tolerate full liquid diet. Continue to follow general surgery recommendations.  Leukopenia Possibly due to norovirus infection. WBC count stable.  Hypertension: Blood pressure stable. Continue metoprolol home dose.  Hyponatremia In the setting of hypovolemia. Encourage oral liquids.  Trend sodium.  Hypomagnesemia: Repleted.  Hypokalemia: Potassium 4.1 status post repletion.  Hyperlipidemia On Crestor therapy.      Out of bed to chair. Incentive spirometry. Nursing supportive care. Fall, aspiration precautions. DVT prophylaxis   Code Status: Full Code  Subjective: Patient is seen and examined today morning. He has abdominal distention, able to tolerate liquid diet. Has loose bowel movement. Also c/o gaseous discomfort.  Physical Exam: Vitals:   08/13/23 1647 08/13/23 2008 08/14/23 0415 08/14/23 0806  BP: (!) 140/93 139/89 (!) 127/91 129/83   Pulse: 77 77 79 84  Resp: 18 16 19 18   Temp: 98 F (36.7 C) 99.8 F (37.7 C) 98 F (36.7 C) 98.3 F (36.8 C)  TempSrc:      SpO2: 100% 100% 100% 99%    General - Elderly African American male, mild distress due to abdominal discomfort. HEENT - PERRLA, EOMI, atraumatic head, non tender sinuses. Lung - Clear, diffuse rhonchi, no wheezes. Heart - S1, S2 heard, no murmurs, rubs, trace pedal edema. Abdomen - Soft, non tender, distended, bowel sounds sluggish Neuro - Alert, awake and oriented x 3, non focal exam. Skin - Warm and dry.  Data Reviewed:      Latest Ref Rng & Units 08/14/2023    7:16 AM 08/13/2023    6:25 AM 08/12/2023    9:27 AM  CBC  WBC 4.0 - 10.5 K/uL 3.7  3.2  3.8   Hemoglobin 13.0 - 17.0 g/dL 81.1  91.4  78.2   Hematocrit 39.0 - 52.0 % 33.7  38.2  37.6   Platelets 150 - 400 K/uL 180  181  168       Latest Ref Rng & Units 08/14/2023    7:16 AM 08/13/2023    6:25 AM 08/12/2023    9:27 AM  BMP  Glucose 70 - 99 mg/dL 97  956  213   BUN 8 - 23 mg/dL 13  11  18    Creatinine 0.61 - 1.24 mg/dL 0.86  5.78  4.69   Sodium 135 - 145 mmol/L 131  135  131   Potassium 3.5 - 5.1 mmol/L 4.1  3.8  3.2   Chloride 98 - 111 mmol/L 98  100  102   CO2 22 - 32 mmol/L  22  23  20    Calcium 8.9 - 10.3 mg/dL 8.5  9.0  8.7    DG Abd Portable 1V Result Date: 08/13/2023 CLINICAL DATA:  [id number] Ileus [facility]) [id number] EXAM: PORTABLE ABDOMEN - 1 VIEW COMPARISON:  08/11/2023 FINDINGS: Progressive dilation of small bowel throughout the abdomen now measuring up to 5.3 cm in diameter (previously 4.4 cm). Air is present throughout the colon. No gross free intraperitoneal air. IMPRESSION: Progressive dilation of small bowel throughout the abdomen now measuring up to 5.3 cm in diameter. Although this may reflect ileus, appearance is concerning for small bowel obstruction. Electronically Signed   By: Duanne Guess D.O.   On: 08/13/2023 09:21   Family Communication: Discussed with patient, he understand  and agree. All questions answereed.  Disposition: Status is: Inpatient Remains inpatient appropriate because: Advance diet, surgery follow up  Planned Discharge Destination: Home with Home Health     Time spent: 38 minutes  Author: Marcelino Duster, MD 08/14/2023 1:37 PM Secure chat 7am to 7pm For on call review [email or web address].

## 2023-08-14 NOTE — Plan of Care (Signed)

## 2023-08-15 ENCOUNTER — Inpatient Hospital Stay (HOSPITAL_COMMUNITY): Payer: Medicare Other

## 2023-08-15 DIAGNOSIS — K56600 Partial intestinal obstruction, unspecified as to cause: Secondary | ICD-10-CM | POA: Diagnosis not present

## 2023-08-15 DIAGNOSIS — A0811 Acute gastroenteropathy due to Norwalk agent: Secondary | ICD-10-CM | POA: Diagnosis not present

## 2023-08-15 DIAGNOSIS — E876 Hypokalemia: Secondary | ICD-10-CM | POA: Diagnosis not present

## 2023-08-15 DIAGNOSIS — K567 Ileus, unspecified: Secondary | ICD-10-CM | POA: Diagnosis not present

## 2023-08-15 LAB — BASIC METABOLIC PANEL
Anion gap: 12 (ref 5–15)
BUN: 10 mg/dL (ref 8–23)
CO2: 22 mmol/L (ref 22–32)
Calcium: 8.5 mg/dL — ABNORMAL LOW (ref 8.9–10.3)
Chloride: 94 mmol/L — ABNORMAL LOW (ref 98–111)
Creatinine, Ser: 0.87 mg/dL (ref 0.61–1.24)
GFR, Estimated: >60 mL/min (ref >60)
Glucose, Bld: 105 mg/dL — ABNORMAL HIGH (ref 70–99)
Potassium: 3.8 mmol/L (ref 3.5–5.1)
Sodium: 128 mmol/L — ABNORMAL LOW (ref 135–145)

## 2023-08-15 LAB — CBC
HCT: 33.2 % — ABNORMAL LOW (ref 39.0–52.0)
Hemoglobin: 11.6 g/dL — ABNORMAL LOW (ref 13.0–17.0)
MCH: 28.4 pg (ref 26.0–34.0)
MCHC: 34.9 g/dL (ref 30.0–36.0)
MCV: 81.2 fL (ref 80.0–100.0)
Platelets: 185 10*3/uL (ref 150–400)
RBC: 4.09 MIL/uL — ABNORMAL LOW (ref 4.22–5.81)
RDW: 12.7 % (ref 11.5–15.5)
WBC: 5 10*3/uL (ref 4.0–10.5)
nRBC: 0 % (ref 0.0–0.2)

## 2023-08-15 MED ORDER — DIATRIZOATE MEGLUMINE & SODIUM 66-10 % PO SOLN
90.0000 mL | Freq: Once | ORAL | Status: AC
  Administered 2023-08-15: 90 mL via NASOGASTRIC
  Filled 2023-08-15: qty 90

## 2023-08-15 NOTE — Progress Notes (Addendum)
       Subjective: Bloated. Denies nausea. Passing some flatus, having watery diarrhea. Tolerating clears but worsening distension  Objective: Vital signs in last 24 hours: Temp:  [99.2 F (37.3 C)] 99.2 F (37.3 C) (02/14 2117) Pulse Rate:  [79-87] 87 (02/15 0944) Resp:  [18] 18 (02/14 2117) BP: (135-149)/(79-89) 149/79 (02/15 0944) SpO2:  [100 %] 100 % (02/14 2117) Last BM Date : 08/14/23  Intake/Output from previous day: No intake/output data recorded. Intake/Output this shift: No intake/output data recorded.  PE: Gen: NAD Abd: significant abdominal distension, tympanic  Lab Results:  Recent Labs    08/14/23 0716 08/15/23 0726  WBC 3.7* 5.0  HGB 11.4* 11.6*  HCT 33.7* 33.2*  PLT 180 185   BMET Recent Labs    08/14/23 0716 08/15/23 0726  NA 131* 128*  K 4.1 3.8  CL 98 94*  CO2 22 22  GLUCOSE 97 105*  BUN 13 10  CREATININE 1.02 0.87  CALCIUM 8.5* 8.5*   PT/INR No results for input(s): "LABPROT", "INR" in the last 72 hours. CMP     Component Value Date/Time   NA 128 (L) 08/15/2023 0726   K 3.8 08/15/2023 0726   CL 94 (L) 08/15/2023 0726   CO2 22 08/15/2023 0726   GLUCOSE 105 (H) 08/15/2023 0726   BUN 10 08/15/2023 0726   CREATININE 0.87 08/15/2023 0726   CREATININE 1.15 02/06/2014 0816   CALCIUM 8.5 (L) 08/15/2023 0726   PROT 6.7 08/12/2023 0927   ALBUMIN 3.5 08/12/2023 0927   AST 20 08/12/2023 0927   ALT 23 08/12/2023 0927   ALKPHOS 49 08/12/2023 0927   BILITOT 1.2 08/12/2023 0927   GFRNONAA >60 08/15/2023 0726   GFRAA >60 05/24/2018 0952   Lipase     Component Value Date/Time   LIPASE 25 08/12/2023 0927       Studies/Results: DG Abd Portable 1V Result Date: 08/15/2023 CLINICAL DATA:  Abdominal distension and small-bowel ileus/obstruction. EXAM: PORTABLE ABDOMEN - 1 VIEW COMPARISON:  08/13/2023 FINDINGS: There is some increased prominence of dilatation of central small bowel loops. Small amount of air remains in the colon. Findings  support small-bowel obstruction. No gross signs of free air or pneumatosis. IMPRESSION: Increased prominence of dilatation of central small bowel loops. Findings support small-bowel obstruction. Electronically Signed   By: Irish Lack M.D.   On: 08/15/2023 13:13    Anti-infectives: Anti-infectives (From admission, onward)    None        Assessment/Plan Psbo vs enteritis - no gastric dilation on KUB and tolerating clears - given worsening distension will order SBO protocol with gastrografin. Okay to drink gastrografin since no nausea and no gastric distension and having some bowel function. If any nausea, then hold GG, insert NGT for decompression, then may proceed with gastrografin via NGT. - Aggressive electrolyte supplementation, K > 4.0, Mg > 2.0, Phos > 3.0  FEN - CLD VTE - lovenox ID - none currently needed  I reviewed hospitalist notes, last 24 h vitals and pain scores, last 48 h intake and output, last 24 h labs and trends, and last 24 h imaging results. Discussed patient directly with Dr. Clide Dales   LOS: 2 days    Lysle Rubens ,MD [facility] 08/15/2023, 4:36 PM

## 2023-08-15 NOTE — Progress Notes (Signed)
Progress Note   Patient: [name] LOV:[id number] DOB: Feb 12, 1946 DOA: 08/12/2023     2 DOS: the patient was seen and examined on 08/15/2023   Brief hospital course: Nathanael Krist is a 78 y.o. male with medical history significant of HTN, HLD, hx of SBO presenting to the ED with abdominal pain. Patient reports that he has been having abdominal pain since Monday. Abdominal pain is periumbilical and does not radiate anywhere else. He had normal fully-formed BMs on Sunday, but on Monday began noticing very loose stools. Reports going about 5-6 times per day since Monday. Does endorse some associated dry heaving, but otherwise no vomiting.   Patient is admitted to the hospitalist service for evaluation of partial small bowel obstruction versus enteritis.  GI panel came back positive for norovirus.  Patient is currently able to tolerate full liquid diet, on conservative management, surgery team following.  Assessment and Plan: Partial small bowel obstruction. Norovirus enteritis Continue supportive care. Patient is able to tolerate full liquid diet, having loose bowel movement. KUB done shows persistent sbo findings. General surgery team follow up will be called for further recommendations.  Leukopenia Possibly due to norovirus infection. WBC count improved.  Hypertension: Blood pressure stable. Continue metoprolol home dose.  Hyponatremia In the setting of hypovolemia. Encourage oral liquids.  Trend sodium.  Hypomagnesemia: Repleted.  Hypokalemia: Potassium 4.1 status post repletion.  Hyperlipidemia On Crestor therapy.      Out of bed to chair. Incentive spirometry. Nursing supportive care. Fall, aspiration precautions. DVT prophylaxis   Code Status: Full Code  Subjective: Patient is seen and examined today morning. He continues to have abdominal distention, tolerates liquid diet. Has loose bowel movement.   Physical Exam: Vitals:   08/14/23 0806 08/14/23 1550  08/14/23 2117 08/15/23 0944  BP: 129/83 (!) 133/91 135/89 (!) 149/79  Pulse: 84 73 79 87  Resp: 18 18 18    Temp: 98.3 F (36.8 C) 98 F (36.7 C) 99.2 F (37.3 C)   TempSrc:   Oral   SpO2: 99% 100% 100%     General - Elderly African American male, mild distress due to abdominal discomfort. HEENT - PERRLA, EOMI, atraumatic head, non tender sinuses. Lung - Clear, diffuse rhonchi, no wheezes. Heart - S1, S2 heard, no murmurs, rubs, trace pedal edema. Abdomen - Soft, non tender, distended, bowel sounds sluggish Neuro - Alert, awake and oriented x 3, non focal exam. Skin - Warm and dry.  Data Reviewed:      Latest Ref Rng & Units 08/15/2023    7:26 AM 08/14/2023    7:16 AM 08/13/2023    6:25 AM  CBC  WBC 4.0 - 10.5 K/uL 5.0  3.7  3.2   Hemoglobin 13.0 - 17.0 g/dL 88.4  16.6  06.3   Hematocrit 39.0 - 52.0 % 33.2  33.7  38.2   Platelets 150 - 400 K/uL 185  180  181       Latest Ref Rng & Units 08/15/2023    7:26 AM 08/14/2023    7:16 AM 08/13/2023    6:25 AM  BMP  Glucose 70 - 99 mg/dL 016  97  010   BUN 8 - 23 mg/dL 10  13  11    Creatinine 0.61 - 1.24 mg/dL 9.32  3.55  7.32   Sodium 135 - 145 mmol/L 128  131  135   Potassium 3.5 - 5.1 mmol/L 3.8  4.1  3.8   Chloride 98 - 111 mmol/L 94  98  100   CO2 22 - 32 mmol/L 22  22  23    Calcium 8.9 - 10.3 mg/dL 8.5  8.5  9.0    DG Abd Portable 1V Result Date: 08/15/2023 CLINICAL DATA:  Abdominal distension and small-bowel ileus/obstruction. EXAM: PORTABLE ABDOMEN - 1 VIEW COMPARISON:  08/13/2023 FINDINGS: There is some increased prominence of dilatation of central small bowel loops. Small amount of air remains in the colon. Findings support small-bowel obstruction. No gross signs of free air or pneumatosis. IMPRESSION: Increased prominence of dilatation of central small bowel loops. Findings support small-bowel obstruction. Electronically Signed   By: Irish Lack M.D.   On: 08/15/2023 13:13   Family Communication: Discussed with  patient, he understand and agree. All questions answereed.  Disposition: Status is: Inpatient Remains inpatient appropriate because: Advance diet, surgery follow up  Planned Discharge Destination: Home with Home Health     Time spent: 39 minutes  Author: Marcelino Duster, MD 08/15/2023 3:31 PM Secure chat 7am to 7pm For on call review [email or web address].

## 2023-08-15 NOTE — Plan of Care (Signed)

## 2023-08-16 ENCOUNTER — Inpatient Hospital Stay (HOSPITAL_COMMUNITY): Payer: Medicare Other

## 2023-08-16 DIAGNOSIS — E876 Hypokalemia: Secondary | ICD-10-CM | POA: Diagnosis not present

## 2023-08-16 DIAGNOSIS — K567 Ileus, unspecified: Secondary | ICD-10-CM | POA: Diagnosis not present

## 2023-08-16 DIAGNOSIS — K56600 Partial intestinal obstruction, unspecified as to cause: Secondary | ICD-10-CM | POA: Diagnosis not present

## 2023-08-16 DIAGNOSIS — A0811 Acute gastroenteropathy due to Norwalk agent: Secondary | ICD-10-CM | POA: Diagnosis not present

## 2023-08-16 LAB — CBC
HCT: 32.8 % — ABNORMAL LOW (ref 39.0–52.0)
Hemoglobin: 11.5 g/dL — ABNORMAL LOW (ref 13.0–17.0)
MCH: 28.5 pg (ref 26.0–34.0)
MCHC: 35.1 g/dL (ref 30.0–36.0)
MCV: 81.2 fL (ref 80.0–100.0)
Platelets: 232 10*3/uL (ref 150–400)
RBC: 4.04 MIL/uL — ABNORMAL LOW (ref 4.22–5.81)
RDW: 12.6 % (ref 11.5–15.5)
WBC: 6.6 10*3/uL (ref 4.0–10.5)
nRBC: 0 % (ref 0.0–0.2)

## 2023-08-16 LAB — BASIC METABOLIC PANEL
Anion gap: 12 (ref 5–15)
BUN: 11 mg/dL (ref 8–23)
CO2: 27 mmol/L (ref 22–32)
Calcium: 8.5 mg/dL — ABNORMAL LOW (ref 8.9–10.3)
Chloride: 89 mmol/L — ABNORMAL LOW (ref 98–111)
Creatinine, Ser: 1.01 mg/dL (ref 0.61–1.24)
GFR, Estimated: >60 mL/min (ref >60)
Glucose, Bld: 116 mg/dL — ABNORMAL HIGH (ref 70–99)
Potassium: 3.2 mmol/L — ABNORMAL LOW (ref 3.5–5.1)
Sodium: 128 mmol/L — ABNORMAL LOW (ref 135–145)

## 2023-08-16 LAB — PHOSPHORUS: Phosphorus: 2.7 mg/dL (ref 2.5–4.6)

## 2023-08-16 LAB — MAGNESIUM: Magnesium: 2.1 mg/dL (ref 1.7–2.4)

## 2023-08-16 MED ORDER — POTASSIUM CHLORIDE 20 MEQ PO PACK
40.0000 meq | PACK | Freq: Every day | ORAL | Status: AC
  Administered 2023-08-16 – 2023-08-18 (×3): 40 meq via ORAL
  Filled 2023-08-16 (×3): qty 2

## 2023-08-16 NOTE — Plan of Care (Signed)

## 2023-08-16 NOTE — Progress Notes (Signed)
Progress Note   Patient: [name] UXL:[id number] DOB: [birth year] DOA: 08/12/2023     3 DOS: the patient was seen and examined on 08/16/2023   Brief hospital course: [name] is a 78 y.o. male with medical history significant of HTN, HLD, hx of SBO presenting to the ED with abdominal pain. Patient reports that he has been having abdominal pain since Monday. Abdominal pain is periumbilical and does not radiate anywhere else. He had normal fully-formed BMs on Sunday, but on Monday began noticing very loose stools. Reports going about 5-6 times per day since Monday. Does endorse some associated dry heaving, but otherwise no vomiting.   Patient is admitted to the hospitalist service for evaluation of partial small bowel obstruction versus enteritis.  GI panel came back positive for norovirus.  Patient is currently able to tolerate full liquid diet, on conservative management, surgery team following.  Assessment and Plan: Partial small bowel obstruction. Norovirus enteritis Continue supportive care. Patient is able to tolerate full liquid diet, having loose bowel movement. Abd xray SBO protocol showed persistent but improved small bowel dilation. General surgery team follow up appreciated, advised to advance diet, no indication for surgery.  Leukopenia Due to norovirus infection. WBC count improved.  Hypertension: Blood pressure stable. Continue metoprolol home dose.  Hyponatremia In the setting of hypovolemia. Encourage oral liquids.  Trend sodium.  Hypomagnesemia: Add on labs ordered. Will replete as needed.  Hypokalemia: Potassium 3.2 today oral potassium repletion ordered.  Hyperlipidemia On Crestor therapy.      Out of bed to chair. Incentive spirometry. Nursing supportive care. Fall, aspiration precautions. DVT prophylaxis   Code Status: Full Code  Subjective: Patient is seen and examined today morning. He has loose stools, passing gas, continues to have  abdominal distention, tolerates liquid diet. Able to get out of bed.  Physical Exam: Vitals:   08/15/23 1752 08/15/23 2006 08/16/23 0753 08/16/23 0909  BP: 128/89 131/86 (!) 129/90 (!) 129/90  Pulse: 79 81 78 78  Resp: 18 19 18    Temp: 99.2 F (37.3 C) 99.1 F (37.3 C) 98.9 F (37.2 C)   TempSrc:      SpO2: 100% 100% 100%     General - Elderly African American male, distress due to abdominal distention. HEENT - PERRLA, EOMI, atraumatic head, non tender sinuses. Lung - Clear, diffuse rhonchi, no wheezes. Heart - S1, S2 heard, no murmurs, rubs, trace pedal edema. Abdomen - Soft, non tender, distended, bowel sounds good Neuro - Alert, awake and oriented x 3, non focal exam. Skin - Warm and dry.  Data Reviewed:      Latest Ref Rng & Units 08/16/2023    7:41 AM 08/15/2023    7:26 AM 08/14/2023    7:16 AM  CBC  WBC 4.0 - 10.5 K/uL 6.6  5.0  3.7   Hemoglobin 13.0 - 17.0 g/dL 53.6  64.4  03.4   Hematocrit 39.0 - 52.0 % 32.8  33.2  33.7   Platelets 150 - 400 K/uL 232  185  180       Latest Ref Rng & Units 08/16/2023    7:41 AM 08/15/2023    7:26 AM 08/14/2023    7:16 AM  BMP  Glucose 70 - 99 mg/dL 742  595  97   BUN 8 - 23 mg/dL 11  10  13    Creatinine 0.61 - 1.24 mg/dL 6.38  7.56  4.33   Sodium 135 - 145 mmol/L 128  128  131  Potassium 3.5 - 5.1 mmol/L 3.2  3.8  4.1   Chloride 98 - 111 mmol/L 89  94  98   CO2 22 - 32 mmol/L 27  22  22    Calcium 8.9 - 10.3 mg/dL 8.5  8.5  8.5    DG Abd Portable 1V-Small Bowel Obstruction Protocol-initial, 8 hr delay Result Date: 08/16/2023 CLINICAL DATA:  8 hour small-bowel follow-up exam EXAM: PORTABLE ABDOMEN - 1 VIEW COMPARISON:  Film from the previous day. FINDINGS: Scattered large and small bowel gas is noted. Persistent small bowel dilatation is seen although mildly improved when compared with the prior study. Contrast material is noted within the colon IMPRESSION: Persistent but improved small bowel dilatation. Administered contrast  lies within the colon consistent with a partial small bowel obstruction. Electronically Signed   By: Alcide Clever M.D.   On: 08/16/2023 01:09   DG Abd Portable 1V Result Date: 08/15/2023 CLINICAL DATA:  Abdominal distension and small-bowel ileus/obstruction. EXAM: PORTABLE ABDOMEN - 1 VIEW COMPARISON:  08/13/2023 FINDINGS: There is some increased prominence of dilatation of central small bowel loops. Small amount of air remains in the colon. Findings support small-bowel obstruction. No gross signs of free air or pneumatosis. IMPRESSION: Increased prominence of dilatation of central small bowel loops. Findings support small-bowel obstruction. Electronically Signed   By: Irish Lack M.D.   On: 08/15/2023 13:13   Family Communication: Discussed with patient, he understand and agree. All questions answereed.  Disposition: Status is: Inpatient Remains inpatient appropriate because: Advance diet, surgery follow up  Planned Discharge Destination: Home with Home Health     Time spent: 37 minutes  Author: Marcelino Duster, MD 08/16/2023 11:21 AM Secure chat 7am to 7pm For on call review [email or web address].

## 2023-08-16 NOTE — Progress Notes (Signed)
   Subjective/Chief Complaint: Pt feels better and fells distention better today +BMs   Objective: Vital signs in last 24 hours: Temp:  [99.1 F (37.3 C)-99.2 F (37.3 C)] 99.1 F (37.3 C) (02/15 2006) Pulse Rate:  [79-87] 81 (02/15 2006) Resp:  [18-19] 19 (02/15 2006) BP: (128-149)/(79-89) 131/86 (02/15 2006) SpO2:  [100 %] 100 % (02/15 2006) Last BM Date : 08/14/23  Intake/Output from previous day: No intake/output data recorded. Intake/Output this shift: No intake/output data recorded. PE:  Constitutional: No acute distress, conversant, appears states age. Eyes: Anicteric sclerae, moist conjunctiva, no lid lag Lungs: Clear to auscultation bilaterally, normal respiratory effort CV: regular rate and rhythm, no murmurs, no peripheral edema, pedal pulses 2+ GI: Soft, no masses or hepatosplenomegaly, non-tender to palpation, distended, no peritonitic Skin: No rashes, palpation reveals normal turgor Psychiatric: appropriate judgment and insight, oriented to person, place, and time    Lab Results:  Recent Labs    08/14/23 0716 08/15/23 0726  WBC 3.7* 5.0  HGB 11.4* 11.6*  HCT 33.7* 33.2*  PLT 180 185   BMET Recent Labs    08/14/23 0716 08/15/23 0726  NA 131* 128*  K 4.1 3.8  CL 98 94*  CO2 22 22  GLUCOSE 97 105*  BUN 13 10  CREATININE 1.02 0.87  CALCIUM 8.5* 8.5*   PT/INR No results for input(s): "LABPROT", "INR" in the last 72 hours. ABG No results for input(s): "PHART", "HCO3" in the last 72 hours.  Invalid input(s): "PCO2", "PO2"  Studies/Results: DG Abd Portable 1V-Small Bowel Obstruction Protocol-initial, 8 hr delay Result Date: 08/16/2023 CLINICAL DATA:  8 hour small-bowel follow-up exam EXAM: PORTABLE ABDOMEN - 1 VIEW COMPARISON:  Film from the previous day. FINDINGS: Scattered large and small bowel gas is noted. Persistent small bowel dilatation is seen although mildly improved when compared with the prior study. Contrast material is noted  within the colon IMPRESSION: Persistent but improved small bowel dilatation. Administered contrast lies within the colon consistent with a partial small bowel obstruction. Electronically Signed   By: Alcide Clever M.D.   On: 08/16/2023 01:09   DG Abd Portable 1V Result Date: 08/15/2023 CLINICAL DATA:  Abdominal distension and small-bowel ileus/obstruction. EXAM: PORTABLE ABDOMEN - 1 VIEW COMPARISON:  08/13/2023 FINDINGS: There is some increased prominence of dilatation of central small bowel loops. Small amount of air remains in the colon. Findings support small-bowel obstruction. No gross signs of free air or pneumatosis. IMPRESSION: Increased prominence of dilatation of central small bowel loops. Findings support small-bowel obstruction. Electronically Signed   By: Irish Lack M.D.   On: 08/15/2023 13:13    Assessment/Plan: 54M Norovirus enteritis  Ileus resolving -Pt passing gas with contrast in the colon.  Pt con't with BMs.  Resolving ileus likely 2/2 to enteritis.  No surgery needed. -OK to adv diet as tol -please call back with questions  LOS: 3 days    Axel Filler 08/16/2023

## 2023-08-17 DIAGNOSIS — K56600 Partial intestinal obstruction, unspecified as to cause: Secondary | ICD-10-CM | POA: Diagnosis not present

## 2023-08-17 DIAGNOSIS — A0811 Acute gastroenteropathy due to Norwalk agent: Secondary | ICD-10-CM | POA: Diagnosis not present

## 2023-08-17 DIAGNOSIS — R197 Diarrhea, unspecified: Secondary | ICD-10-CM | POA: Diagnosis not present

## 2023-08-17 DIAGNOSIS — R112 Nausea with vomiting, unspecified: Secondary | ICD-10-CM | POA: Diagnosis not present

## 2023-08-17 DIAGNOSIS — E876 Hypokalemia: Secondary | ICD-10-CM | POA: Diagnosis not present

## 2023-08-17 DIAGNOSIS — R109 Unspecified abdominal pain: Secondary | ICD-10-CM | POA: Diagnosis not present

## 2023-08-17 DIAGNOSIS — E878 Other disorders of electrolyte and fluid balance, not elsewhere classified: Secondary | ICD-10-CM

## 2023-08-17 DIAGNOSIS — K567 Ileus, unspecified: Secondary | ICD-10-CM | POA: Diagnosis not present

## 2023-08-17 LAB — BASIC METABOLIC PANEL
Anion gap: 11 (ref 5–15)
BUN: 12 mg/dL (ref 8–23)
CO2: 25 mmol/L (ref 22–32)
Calcium: 8.4 mg/dL — ABNORMAL LOW (ref 8.9–10.3)
Chloride: 93 mmol/L — ABNORMAL LOW (ref 98–111)
Creatinine, Ser: 0.99 mg/dL (ref 0.61–1.24)
GFR, Estimated: >60 mL/min (ref >60)
Glucose, Bld: 107 mg/dL — ABNORMAL HIGH (ref 70–99)
Potassium: 3.5 mmol/L (ref 3.5–5.1)
Sodium: 129 mmol/L — ABNORMAL LOW (ref 135–145)

## 2023-08-17 LAB — CBC
HCT: 33.7 % — ABNORMAL LOW (ref 39.0–52.0)
Hemoglobin: 11.7 g/dL — ABNORMAL LOW (ref 13.0–17.0)
MCH: 28.3 pg (ref 26.0–34.0)
MCHC: 34.7 g/dL (ref 30.0–36.0)
MCV: 81.4 fL (ref 80.0–100.0)
Platelets: 262 10*3/uL (ref 150–400)
RBC: 4.14 MIL/uL — ABNORMAL LOW (ref 4.22–5.81)
RDW: 12.8 % (ref 11.5–15.5)
WBC: 8.4 10*3/uL (ref 4.0–10.5)
nRBC: 0 % (ref 0.0–0.2)

## 2023-08-17 LAB — HEMOGLOBIN AND HEMATOCRIT, BLOOD
HCT: 34.5 % — ABNORMAL LOW (ref 39.0–52.0)
Hemoglobin: 12 g/dL — ABNORMAL LOW (ref 13.0–17.0)

## 2023-08-17 MED ORDER — HYDROCORTISONE (PERIANAL) 2.5 % EX CREA
TOPICAL_CREAM | Freq: Once | CUTANEOUS | Status: AC
  Filled 2023-08-17: qty 28.35

## 2023-08-17 MED ORDER — POLYETHYLENE GLYCOL 3350 17 G PO PACK
17.0000 g | PACK | Freq: Every day | ORAL | Status: DC
  Administered 2023-08-18 – 2023-08-19 (×2): 17 g via ORAL
  Filled 2023-08-17 (×2): qty 1

## 2023-08-17 MED ORDER — METOCLOPRAMIDE HCL 5 MG/ML IJ SOLN
10.0000 mg | Freq: Four times a day (QID) | INTRAMUSCULAR | Status: DC
  Administered 2023-08-17 – 2023-08-25 (×29): 10 mg via INTRAVENOUS
  Filled 2023-08-17 (×29): qty 2

## 2023-08-17 MED ORDER — PANTOPRAZOLE SODIUM 40 MG IV SOLR
40.0000 mg | INTRAVENOUS | Status: DC
  Administered 2023-08-17 – 2023-08-28 (×11): 40 mg via INTRAVENOUS
  Filled 2023-08-17 (×11): qty 10

## 2023-08-17 MED ORDER — BOOST / RESOURCE BREEZE PO LIQD CUSTOM
1.0000 | Freq: Three times a day (TID) | ORAL | Status: DC
  Administered 2023-08-17 – 2023-08-19 (×5): 1 via ORAL

## 2023-08-17 NOTE — Consult Note (Signed)
Consultation  Referring Provider: [facility]/Sreeram. Primary Care Physician:  Bradd Canary, MD Primary Gastroenterologist:  Dr. Leone Payor  Reason for Consultation: Persistent ileus versus partial small bowel obstruction  HPI: [name] is a 78 y.o. male, with history of hypertension, hyperlipidemia adenomatous colon polyps, status post laparoscopic ventral hernia repair with mesh . Patient was admitted 5 days ago with complaints of 2 to 3-day history of nausea vomiting abdominal pain and diarrhea.  He says he was having at least 5-6 diarrheal bowel movements at home and was unable to keep down any p.o.'s.  GI path panel positive for Norovirus.  Had CT of the abdomen and pelvis done on admission 08/11/2023 which showed segmental dilation of the mid to distal jejunum within the left mid abdomen measuring up to 4.4 cm and with multiple gas fluid levels identified proximal jejunum and distal ileum normal in caliber findings consistent with small bowel obstruction versus long segment of ileus small bowel, sigmoidal diverticulosis without diverticulitis, enlarged prostate. He was seen by surgery earlier this admission due to concern for partial small bowel obstruction.  He had had a Urschel small bowel obstruction in 2017.  Drees opinion that this was a viral enteritis, he has not required NG tube, he did undergo small bowel protocol with barium to rule out obstruction, and had good progression of contrast to the colon.  Vomiting has resolved, he still has some nausea though not constant and has been able to advance to a full liquid diet.  He tried to eat a solid diet yesterday and felt very nauseated with dry heaves but did not vomit.  He is having bowel movements and passing gas.  He had about 5 bowel movement so far today. Plain films were needed yesterday and showed persistent but improved small bowel dilation administered contrast within the colon and read as consistent with partial small bowel  obstruction.  He was hypokalemic, that has been corrected  Labs today with WBC 8.4/hemoglobin 11.7/hematocrit 33.7 Sodium 129/potassium 3.5/BUN 12/creatinine 0.99  Magnesium yesterday 2.1/phosphorus yesterday 2.7- both improved  Last colonoscopy February 2023 with 2 small polyps removed and also noted multiple diverticuli.  Showed 1 tubular adenoma and 1 nonpolyp both diminutive, no recall due to age.  Past Medical History:  Diagnosis Date   Abnormal CT scan, gastrointestinal tract suspect angioedema    Anemia 03/16/2016   IRON   Anxiety    Arthritis    Back pain 10/10/2012   Fatty liver 2005   seen on 2005 ultrasound,05/2015 CT.    Hemorrhoids 08/22/2016   Hemorrhoids, internal, with bleeding 2005   internal and external. 2005 band ligation (dr Noe Gens in [facility])   Hyperglycemia 08/21/2013   Hyperlipidemia    Hypertension    Nocturia 08/21/2013   Obesity (BMI 30.0-34.9)    Osteoporosis    Pneumonia    as a child   Pre-diabetes    Rectal bleeding 07/30/2014   Renal cyst    Left Kidney   SBO (small bowel obstruction) (HCC)    Tubular adenoma of colon before 2005, 2012, 08/2014   Unspecified constipation 02/06/2014   Vitamin D deficiency     Past Surgical History:  Procedure Laterality Date   BICEPT TENODESIS Right 03/12/2023   Procedure: BICEPS TENODESIS;  Surgeon: Cammy Copa, MD;  Location[facility];  Service: Orthopedics;  Laterality: Right;   CATARACT EXTRACTION W/ INTRAOCULAR LENS IMPLANT Bilateral    COLONOSCOPY  before 2005, 2012, 2013, 08/2014.    ENTEROSCOPY  N/A 10/05/2017   Procedure: ENTEROSCOPY;  Surgeon: Iva Boop, MD;  Location: [facility];  Service: Endoscopy;  Laterality: N/A;   FRACTURE SURGERY Left 1989   leg   HEMORRHOID BANDING  2005   IR RADIOLOGIST EVAL & MGMT  11/11/2021   KNEE SURGERY Left 2010   arthroscopy, torn carilage   KNEE SURGERY Right 2012   arthroscopy   REVERSE SHOULDER ARTHROPLASTY Right 03/12/2023   Procedure:  REVERSE SHOULDER ARTHROPLASTY;  Surgeon: Cammy Copa, MD;  Location: [facility] OR;  Service: Orthopedics;  Laterality: Right;   TONSILLECTOMY  1957   TOTAL KNEE ARTHROPLASTY Right 07/24/2022   Procedure: RIGHT TOTAL KNEE ARTHROPLASTY;  Surgeon: Cammy Copa, MD;  Location: [facility] OR;  Service: Orthopedics;  Laterality: Right;   TOTAL SHOULDER ARTHROPLASTY Left 11/01/2020   Procedure: LEFT ANATOMIC SHOULDER REPLACEMENT;  Surgeon: Cammy Copa, MD;  Location: [facility] OR;  Service: Orthopedics;  Laterality: Left;   VENTRAL HERNIA REPAIR N/A 06/03/2018   Procedure: LAPAROSCOPIC VENTRAL WALL  HERNIA  REPAIR WITH MESH  ERAS PATHWAY;  Surgeon: Karie Soda, MD;  Location: [facility] ORS;  Service: General;  Laterality: N/A;    Prior to Admission medications   Medication Sig Start Date End Date Taking? Authorizing Provider  acetaminophen (TYLENOL) 325 MG tablet Take 1-2 tablets (325-650 mg total) by mouth every 6 (six) hours as needed for mild pain (pain score 1-3 or temp > 100.5). 03/13/23  Yes Cammy Copa, MD  ALPRAZolam Prudy Feeler) 0.5 MG tablet Take 0.5 mg by mouth at bedtime as needed for anxiety or sleep.   Yes [provider]  amLODipine (NORVASC) 10 MG tablet TAKE 1 TABLET DAILY 07/06/23  Yes Bradd Canary, MD  aspirin 81 MG chewable tablet Chew 1 tablet (81 mg total) by mouth 2 (two) times daily. 07/25/22  Yes Magnant, Charles L, PA-C  benazepril (LOTENSIN) 40 MG tablet Take 20 mg by mouth daily.   Yes [provider]  Cholecalciferol 25 MCG (1000 UT) capsule Take 1,000 Units by mouth daily.   Yes [provider]  hydrocortisone (ANUSOL-HC) 25 MG suppository Place 25 mg rectally 2 (two) times daily as needed for anal itching.   Yes [provider]  ibuprofen (ADVIL) 800 MG tablet Take 800 mg by mouth 3 (three) times daily.   Yes [provider]  Iron, Ferrous Sulfate, 325 (65 Fe) MG TABS Take 325 mg by mouth daily. 11/12/22  Yes Bradd Canary, MD   losartan (COZAAR) 50 MG tablet Take 1 tablet (50 mg total) by mouth daily. 05/14/23  Yes Bradd Canary, MD  metoprolol succinate (TOPROL-XL) 50 MG 24 hr tablet TAKE AS INSTRUCTED BY YOUR PRESCRIBER Patient taking differently: Take 50 mg by mouth daily. 01/12/23  Yes Bradd Canary, MD  Multiple Vitamins-Minerals (MULTIVITAMIN ADULT PO) Take 2 tablets by mouth daily.   Yes [provider]  rosuvastatin (CRESTOR) 20 MG tablet Take 1 tablet (20 mg total) by mouth daily. 10/22/22  Yes Bradd Canary, MD  sildenafil (VIAGRA) 100 MG tablet Take 100 mg by mouth as needed for erectile dysfunction (1 hour prior to intercourse). 07/18/23  Yes [provider]  tamsulosin (FLOMAX) 0.4 MG CAPS capsule Take 0.4 mg by mouth daily. 03/26/21  Yes [provider]  traMADol (ULTRAM) 50 MG tablet Take 50 mg by mouth every 6 (six) hours as needed for moderate pain (pain score 4-6).   Yes [provider]  gabapentin (NEURONTIN) 300 MG capsule  Take 300 mg by mouth 3 (three) times daily. Patient not taking: Reported on 08/12/2023    [provider]  meloxicam (MOBIC) 15 MG tablet Take 15 mg by mouth daily. Patient not taking: Reported on 08/12/2023    [provider]  methocarbamol (ROBAXIN) 500 MG tablet Take 500 mg by mouth daily. Patient not taking: Reported on 08/12/2023    [provider]  oxyCODONE (OXY IR/ROXICODONE) 5 MG immediate release tablet Take 5 mg by mouth every 4 (four) hours as needed for severe pain (pain score 7-10). Patient not taking: Reported on 08/12/2023    [provider]  Pitavastatin Magnesium 4 MG TABS Take 2 mg by mouth at bedtime. Patient not taking: Reported on 08/12/2023    [provider]  traZODone (DESYREL) 50 MG tablet Take 50 mg by mouth at bedtime. Patient not taking: Reported on 08/12/2023    [provider]    Current Facility-Administered Medications  Medication Dose Route Frequency Provider  Last Rate Last Admin   acetaminophen (TYLENOL) tablet 1,000 mg  1,000 mg Oral Q6H PRN Barnetta Chapel, PA-C   1,000 mg at 08/13/23 0981   Or   acetaminophen (TYLENOL) suppository 650 mg  650 mg Rectal Q6H PRN Barnetta Chapel, PA-C       aspirin chewable tablet 81 mg  81 mg Oral Daily Briscoe Burns, MD   81 mg at 08/17/23 0930   enoxaparin (LOVENOX) injection 40 mg  40 mg Subcutaneous Q24H Briscoe Burns, MD   40 mg at 08/16/23 2127   feeding supplement (BOOST / RESOURCE BREEZE) liquid 1 Container  1 Container Oral TID BM Revanth Neidig S, PA-C       HYDROmorphone (DILAUDID) injection 0.5 mg  0.5 mg Intravenous Q3H PRN Briscoe Burns, MD   0.5 mg at 08/17/23 0346   metoCLOPramide (REGLAN) injection 10 mg  10 mg Intravenous Q6H Delynn Olvera S, PA-C       metoprolol succinate (TOPROL-XL) 24 hr tablet 50 mg  50 mg Oral Daily Briscoe Burns, MD   50 mg at 08/17/23 0930   ondansetron (ZOFRAN) injection 4 mg  4 mg Intravenous Q6H PRN Juliet Rude, PA-C   4 mg at 08/16/23 0415   potassium chloride (KLOR-CON) packet 40 mEq  40 mEq Oral Daily Marcelino Duster, MD   40 mEq at 08/17/23 0930   rosuvastatin (CRESTOR) tablet 20 mg  20 mg Oral Daily Briscoe Burns, MD   20 mg at 08/17/23 1914   traMADol (ULTRAM) tablet 50 mg  50 mg Oral Q6H PRN Barnetta Chapel, PA-C   50 mg at 08/15/23 2213    Allergies as of 08/12/2023   (No Known Allergies)    Family History  Problem Relation Age of Onset   Stroke Mother    Aneurysm Mother        brain   Arthritis Father    Liver cancer Father        liver, atomic testing in military   Gallbladder disease Father    Hyperlipidemia Sister    Hypertension Sister    Obesity Sister    Diabetes Sister    Diabetes Brother    Hyperlipidemia Sister    Hypertension Sister    Obesity Sister    Diabetes Sister    Aneurysm Brother        brain   COPD Brother    Gallbladder disease Sister        x4   Colon  cancer Neg Hx    Colon polyps Neg Hx     Esophageal cancer Neg Hx    Rectal cancer Neg Hx    Stomach cancer Neg Hx    Prostate cancer Neg Hx    CAD Neg Hx     Social History   Socioeconomic History   Marital status: Married    Spouse name: Not on file   Number of children: 1   Years of education: Not on file   Highest education level: Not on file  Occupational History   Occupation: Retired    Associate Professor: [employer]  Tobacco Use   Smoking status: Never   Smokeless tobacco: Never  Vaping Use   Vaping status: Never Used  Substance and Sexual Activity   Alcohol use: Not Currently    Comment: Occassionally   Drug use: No   Sexual activity: Yes  Other Topics Concern   Not on file  Social History Narrative   Married - 1 child   Retired from [employer]   Some EtOH, no tbacco/drug use   Social Drivers of Corporate investment banker Strain: Low Risk  (05/05/2023)   Overall Financial Resource Strain (CARDIA)    Difficulty of Paying Living Expenses: Not hard at all  Food Insecurity: No Food Insecurity (08/12/2023)   Hunger Vital Sign    Worried About Running Out of Food in the Last Year: Never true    Ran Out of Food in the Last Year: Never true  Transportation Needs: No Transportation Needs (08/12/2023)   PRAPARE - Administrator, Civil Service (Medical): No    Lack of Transportation (Non-Medical): No  Physical Activity: Sufficiently Active (05/05/2023)   Exercise Vital Sign    Days of Exercise per Week: 3 days    Minutes of Exercise per Session: 120 min  Stress: No Stress Concern Present (05/05/2023)   [employer] of Occupational Health - Occupational Stress Questionnaire    Feeling of Stress : Not at all  Social Connections: Socially Integrated (08/12/2023)   Social Connection and Isolation Panel [NHANES]    Frequency of Communication with Friends and Family: More than three times a week    Frequency of Social Gatherings with Friends and Family: More than three times a week    Attends  Religious Services: More than 4 times per year    Active Member of [employer] or Organizations: Yes    Attends Engineer, structural: More than 4 times per year    Marital Status: Married  Catering manager Violence: Not At Risk (08/12/2023)   Humiliation, Afraid, Rape, and Kick questionnaire    Fear of Current or Ex-Partner: No    Emotionally Abused: No    Physically Abused: No    Sexually Abused: No    Review of Systems: Pertinent positive and negative review of systems were noted in the above HPI section.  All other review of systems was otherwise negative.   Physical Exam: Vital signs in last 24 hours: Temp:  [98.2 F (36.8 C)-99.3 F (37.4 C)] 99.3 F (37.4 C) (02/17 0843) Pulse Rate:  [78-90] 90 (02/17 0930) Resp:  [18] 18 (02/17 0416) BP: (120-138)/(81-93) 127/81 (02/17 0930) SpO2:  [98 %-100 %] 99 % (02/17 0843) Last BM Date : 08/17/23 General:   Alert,  Well-developed, older African-American male well-nourished, pleasant and cooperative in NAD, at bedside Head:  Normocephalic and atraumatic. Eyes:  Sclera clear, no icterus.   Conjunctiva pink. Ears:  Normal auditory acuity. Nose:  No deformity, discharge,  or lesions. Mouth:  No deformity or lesions.   Neck:  Supple; no masses or thyromegaly. Lungs:  Clear throughout to auscultation.   No wheezes, crackles, or rhonchi.  Heart:  Regular rate and rhythm; no murmurs, clicks, rubs,  or gallops. Abdomen: Distended, bowel sounds quiet, mild generalized tenderness no rebound, small midline incisional scar somewhat tympanitic Rectal: Not done Msk: , Symmetrical without gross deformities. . Pulses:  Normal pulses noted. Extremities:  Without clubbing or edema. Neurologic:  Alert and  oriented x4;  grossly normal neurologically. Skin:  Intact without significant lesions or rashes.. Psych:  Alert and cooperative. Normal mood and affect.  Intake/Output from previous day: 02/16 0701 - 02/17 0700 In: 350 [P.O.:350] Out: -   Intake/Output this shift: No intake/output data recorded.  Lab Results: Recent Labs    08/15/23 0726 08/16/23 0741 08/17/23 0644  WBC 5.0 6.6 8.4  HGB 11.6* 11.5* 11.7*  HCT 33.2* 32.8* 33.7*  PLT 185 232 262   BMET Recent Labs    08/15/23 0726 08/16/23 0741 08/17/23 0644  NA 128* 128* 129*  K 3.8 3.2* 3.5  CL 94* 89* 93*  CO2 22 27 25   GLUCOSE 105* 116* 107*  BUN 10 11 12   CREATININE 0.87 1.01 0.99  CALCIUM 8.5* 8.5* 8.4*   LFT No results for input(s): "PROT", "ALBUMIN", "AST", "ALT", "ALKPHOS", "BILITOT", "BILIDIR", "IBILI" in the last 72 hours. PT/INR No results for input(s): "LABPROT", "INR" in the last 72 hours. Hepatitis Panel No results for input(s): "HEPBSAG", "HCVAB", "HEPAIGM", "HEPBIGM" in the last 72 hours.    IMPRESSION:   78 year old male admitted with 3-day history of nausea vomiting abdominal pain and diarrhea, and GI path panel positive for norovirus Patient had evidence on admission CT of segmental dilation of the mid to distal jejunum with more distal jejunum and ileum normal in caliber. Question of ileus versus partial small bowel obstruction  Patient evaluated by surgery and has undergone small bowel protocol with oral contrast which did traverse to the colon.  Suspect in the setting of an acute norovirus infection that this is an enteritis with ileus rather than partial obstruction  #2 status post previous laparoscopic ventral hernia repair with mesh #3  1 prior admission with partial small bowel obstruction 2017 #4 electrolyte derangement with hypokalemia hypophosphatemia and hypomagnesemia all secondary to acute norovirus infection/gastroenteritis All replaced  #5 mild hyponatremia #6 history of adenomatous colon polyps-up-to-date with colonoscopy last in 2023 and no recall due to age   PLAN: Leave on full liquid diet for now would rather he be able to have some nutrition and not vomit And resource supplements 3 times daily Advised  he to walk frequently to promote motility Will give him a short trial of IV Reglan 10 mg IV every 6 hours  Continue Zofran as needed Advised he try to stop using Dilaudid, as will further slow the bowel.  There is a order for Ultram if needed Continue potassium supplementation keep potassium 3.5-4 Expect will have continued improvement with time GI will follow with you   Othmar Ringer EsterwoodPA-C  08/17/2023, 3:10 PM

## 2023-08-17 NOTE — Plan of Care (Signed)

## 2023-08-17 NOTE — Progress Notes (Signed)
Progress Note   Patient: [name] ZOX:[id number] DOB: 12/29/1945 DOA: 08/12/2023     4 DOS: the patient was seen and examined on 08/17/2023   Brief hospital course: [name] is a 78 y.o. male with medical history significant of HTN, HLD, hx of SBO presenting to the ED with abdominal pain. Patient reports that he has been having abdominal pain since Monday. Abdominal pain is periumbilical and does not radiate anywhere else. He had normal fully-formed BMs on Sunday, but on Monday began noticing very loose stools. Reports going about 5-6 times per day since Monday. Does endorse some associated dry heaving, but otherwise no vomiting.   Patient is admitted to the hospitalist service for evaluation of partial small bowel obstruction versus enteritis.  GI panel came back positive for norovirus.  Patient is currently able to tolerate full liquid diet, on conservative management, surgery team following.  Assessment and Plan: Partial small bowel obstruction. Norovirus enteritis- Continue supportive care. Patient is unable to tolerate soft diet, continues to be on full liquid diet. Continues to have loose bowel movement. Abd xray SBO protocol showed persistent but improved small bowel dilation. General surgery team advised to advance diet, no indication for surgery.  Leukopenia- Due to norovirus infection. WBC count improved.  Hypertension- Blood pressure stable. Continue metoprolol home dose.  Hyponatremia- In the setting of hypovolemia. Encourage oral liquids.  Trend sodium.  Hypomagnesemia- Replete as needed.  Hypokalemia- Potassium improved with repletion. Daily potassium supplementation.  Hyperlipidemia- On Crestor therapy.      Out of bed to chair. Incentive spirometry. Nursing supportive care. Fall, aspiration precautions. DVT prophylaxis   Code Status: Full Code  Subjective: Patient is seen and examined today morning. His abdominal distention better, did not  tolerate soft diet. Wishes to continue liquid diet. No nausea. Able to ambulate.   Physical Exam: Vitals:   08/16/23 2023 08/17/23 0416 08/17/23 0843 08/17/23 0930  BP: (!) 138/90 (!) 126/93 127/81 127/81  Pulse: 81 82 90 90  Resp: 18 18    Temp: 98.2 F (36.8 C) 99.3 F (37.4 C) 99.3 F (37.4 C)   TempSrc:   Oral   SpO2: 98% 98% 99%     General - Elderly African American male, no acute distress. HEENT - PERRLA, EOMI, atraumatic head, non tender sinuses. Lung - Clear, diffuse rhonchi, no wheezes. Heart - S1, S2 heard, no murmurs, rubs, trace pedal edema. Abdomen - Soft, non tender, distended, bowel sounds good Neuro - Alert, awake and oriented x 3, non focal exam. Skin - Warm and dry.  Data Reviewed:      Latest Ref Rng & Units 08/17/2023    6:44 AM 08/16/2023    7:41 AM 08/15/2023    7:26 AM  CBC  WBC 4.0 - 10.5 K/uL 8.4  6.6  5.0   Hemoglobin 13.0 - 17.0 g/dL 81.1  91.4  78.2   Hematocrit 39.0 - 52.0 % 33.7  32.8  33.2   Platelets 150 - 400 K/uL 262  232  185       Latest Ref Rng & Units 08/17/2023    6:44 AM 08/16/2023    7:41 AM 08/15/2023    7:26 AM  BMP  Glucose 70 - 99 mg/dL 956  213  086   BUN 8 - 23 mg/dL 12  11  10    Creatinine 0.61 - 1.24 mg/dL 5.78  4.69  6.29   Sodium 135 - 145 mmol/L 129  128  128   Potassium 3.5 -  5.1 mmol/L 3.5  3.2  3.8   Chloride 98 - 111 mmol/L 93  89  94   CO2 22 - 32 mmol/L 25  27  22    Calcium 8.9 - 10.3 mg/dL 8.4  8.5  8.5    DG Abd Portable 1V-Small Bowel Obstruction Protocol-initial, 8 hr delay Result Date: 08/16/2023 CLINICAL DATA:  8 hour small-bowel follow-up exam EXAM: PORTABLE ABDOMEN - 1 VIEW COMPARISON:  Film from the previous day. FINDINGS: Scattered large and small bowel gas is noted. Persistent small bowel dilatation is seen although mildly improved when compared with the prior study. Contrast material is noted within the colon IMPRESSION: Persistent but improved small bowel dilatation. Administered contrast lies  within the colon consistent with a partial small bowel obstruction. Electronically Signed   By: Alcide Clever M.D.   On: 08/16/2023 01:09   Family Communication: Discussed with patient, he understand and agree. All questions answereed.  Disposition: Status is: Inpatient Remains inpatient appropriate because: Advance diet as tolerated.  Planned Discharge Destination: Home with Home Health     Time spent: 36 minutes  Author: Marcelino Duster, MD 08/17/2023 10:58 AM Secure chat 7am to 7pm For on call review [email or web address].

## 2023-08-18 ENCOUNTER — Inpatient Hospital Stay (HOSPITAL_COMMUNITY): Payer: Medicare Other

## 2023-08-18 DIAGNOSIS — R109 Unspecified abdominal pain: Secondary | ICD-10-CM | POA: Diagnosis not present

## 2023-08-18 DIAGNOSIS — K567 Ileus, unspecified: Secondary | ICD-10-CM | POA: Diagnosis not present

## 2023-08-18 DIAGNOSIS — R197 Diarrhea, unspecified: Secondary | ICD-10-CM | POA: Diagnosis not present

## 2023-08-18 DIAGNOSIS — A0811 Acute gastroenteropathy due to Norwalk agent: Secondary | ICD-10-CM | POA: Diagnosis not present

## 2023-08-18 DIAGNOSIS — E876 Hypokalemia: Secondary | ICD-10-CM | POA: Diagnosis not present

## 2023-08-18 DIAGNOSIS — K56600 Partial intestinal obstruction, unspecified as to cause: Secondary | ICD-10-CM | POA: Diagnosis not present

## 2023-08-18 DIAGNOSIS — R112 Nausea with vomiting, unspecified: Secondary | ICD-10-CM | POA: Diagnosis not present

## 2023-08-18 LAB — BASIC METABOLIC PANEL
Anion gap: 10 (ref 5–15)
BUN: 11 mg/dL (ref 8–23)
CO2: 28 mmol/L (ref 22–32)
Calcium: 8.3 mg/dL — ABNORMAL LOW (ref 8.9–10.3)
Chloride: 91 mmol/L — ABNORMAL LOW (ref 98–111)
Creatinine, Ser: 0.99 mg/dL (ref 0.61–1.24)
GFR, Estimated: >60 mL/min (ref >60)
Glucose, Bld: 118 mg/dL — ABNORMAL HIGH (ref 70–99)
Potassium: 3.9 mmol/L (ref 3.5–5.1)
Sodium: 129 mmol/L — ABNORMAL LOW (ref 135–145)

## 2023-08-18 LAB — CBC
HCT: 33.1 % — ABNORMAL LOW (ref 39.0–52.0)
Hemoglobin: 11.3 g/dL — ABNORMAL LOW (ref 13.0–17.0)
MCH: 28.1 pg (ref 26.0–34.0)
MCHC: 34.1 g/dL (ref 30.0–36.0)
MCV: 82.3 fL (ref 80.0–100.0)
Platelets: 289 10*3/uL (ref 150–400)
RBC: 4.02 MIL/uL — ABNORMAL LOW (ref 4.22–5.81)
RDW: 12.7 % (ref 11.5–15.5)
WBC: 8.8 10*3/uL (ref 4.0–10.5)
nRBC: 0 % (ref 0.0–0.2)

## 2023-08-18 MED ORDER — MELATONIN 3 MG PO TABS
3.0000 mg | ORAL_TABLET | Freq: Once | ORAL | Status: AC
  Administered 2023-08-18: 3 mg via ORAL
  Filled 2023-08-18: qty 1

## 2023-08-18 MED ORDER — SODIUM CHLORIDE 1 G PO TABS
1.0000 g | ORAL_TABLET | Freq: Two times a day (BID) | ORAL | Status: DC
  Administered 2023-08-18 – 2023-08-20 (×4): 1 g via ORAL
  Filled 2023-08-18 (×5): qty 1

## 2023-08-18 NOTE — Progress Notes (Signed)
    Gastroenterology Inpatient Follow Up    Subjective: Patient feels little bit better today.  He has been sipping on clear liquids.  He is still passing liquid stools.  Patient has been walking around.  Objective: Vital signs in last 24 hours: Temp:  [98.6 F (37 C)-99.9 F (37.7 C)] 98.6 F (37 C) (02/18 0735) Pulse Rate:  [80-84] 82 (02/18 0735) Resp:  [16-20] 16 (02/18 0735) BP: (119-140)/(81-86) 124/86 (02/18 0735) SpO2:  [97 %-99 %] 98 % (02/18 0735) Last BM Date : 08/17/23  Intake/Output from previous day: No intake/output data recorded. Intake/Output this shift: No intake/output data recorded.  General appearance: alert and cooperative Resp: No increased work of breathing Cardio: Regular rate GI: Nontender, nondistended  Lab Results: Recent Labs    08/16/23 0741 08/17/23 0644 08/17/23 1712 08/18/23 0626  WBC 6.6 8.4  --  8.8  HGB 11.5* 11.7* 12.0* 11.3*  HCT 32.8* 33.7* 34.5* 33.1*  PLT 232 262  --  289   BMET Recent Labs    08/16/23 0741 08/17/23 0644 08/18/23 0626  NA 128* 129* 129*  K 3.2* 3.5 3.9  CL 89* 93* 91*  CO2 27 25 28   GLUCOSE 116* 107* 118*  BUN 11 12 11   CREATININE 1.01 0.99 0.99  CALCIUM 8.5* 8.4* 8.3*   LFT No results for input(s): "PROT", "ALBUMIN", "AST", "ALT", "ALKPHOS", "BILITOT", "BILIDIR", "IBILI" in the last 72 hours. PT/INR No results for input(s): "LABPROT", "INR" in the last 72 hours. Hepatitis Panel No results for input(s): "HEPBSAG", "HCVAB", "HEPAIGM", "HEPBIGM" in the last 72 hours. C-Diff No results for input(s): "CDIFFTOX" in the last 72 hours.  Studies/Results: No results found.  Medications: I have reviewed the patient's current medications. Scheduled:  feeding supplement  1 Container Oral TID BM   metoCLOPramide (REGLAN) injection  10 mg Intravenous Q6H   metoprolol succinate  50 mg Oral Daily   pantoprazole (PROTONIX) IV  40 mg Intravenous Q24H   polyethylene glycol  17 g Oral Daily    rosuvastatin  20 mg Oral Daily   Continuous: WUJ:[id number] **OR** acetaminophen, HYDROmorphone (DILAUDID) injection, ondansetron (ZOFRAN) IV, traMADol  Assessment/Plan: 78 year old male with history of ventral hernia repair presented with nausea, vomiting, abdominal pain, and diarrhea. GI pathogen panel was positive for norovirus. We were consulted for persistent ileus versus partial small bowel obstruction. CT upon arrival showed dilation of the mid to distal jejunum that is concerning for ileus versus partial small bowel obstruction.  Small bowel protocol with oral contrast showed contrast that traversed to the colon.  Patient is very slowly improving over time. -Daily KUBs -CLD -Continue trickle MiraLAX -Continue Reglan -Continue to wean narcotics -Maintain K greater than 4 and Mg greater than 2 -Continue to encourage physical activity in order to stimulate BMs   LOS: 5 days   Imogene Burn 08/18/2023, 12:26 PM

## 2023-08-18 NOTE — Progress Notes (Signed)
Progress Note   Patient: [name] DOB: 1946/04/17 DOA: 08/12/2023     5 DOS: the patient was seen and examined on 08/18/2023   Brief hospital course: [name] is a 78 y.o. male with medical history significant of HTN, HLD, hx of SBO presenting to the ED with abdominal pain. Patient reports that he has been having abdominal pain since Monday. Abdominal pain is periumbilical and does not radiate anywhere else. He had normal fully-formed BMs on Sunday, but on Monday began noticing very loose stools. Reports going about 5-6 times per day since Monday. Does endorse some associated dry heaving, but otherwise no vomiting.   Patient is admitted to the hospitalist service for evaluation of partial small bowel obstruction versus enteritis.  GI panel came back positive for norovirus.  Patient is currently able to tolerate full liquid diet, on conservative management, surgery team followed advised to advance diet, no surgical intervention. GI involved yesterday for persistent ileus advised reglan, miralax, avoid opiates.  Assessment and Plan: Partial small bowel obstruction. Norovirus enteritis- Continue supportive care. Patient continues to have distended abdomen, is unable to tolerate soft diet, continues to be on full liquid diet. Wishes to try soft diet tomorrow. Continues to have loose bowel movement. Walking with no difficulty. Abd xray SBO protocol showed persistent but improved small bowel dilation. General surgery team advised to advance diet, no indication for surgery. GI evaluated him advised miralax, reglan therapy avoid opiates.  Leukopenia- Due to norovirus infection. WBC count improved.  Hypertension- Blood pressure stable. Continue metoprolol home dose.  Hyponatremia- In the setting of hypovolemia. Able to tolerate oral liquids.  Salt tabs 1gm bid ordered. Trend sodium.  Hypomagnesemia- Replete as needed.  Hypokalemia- Potassium improved with  repletion. Daily potassium supplementation.  Hyperlipidemia- On Crestor therapy.      Out of bed to chair. Incentive spirometry. Nursing supportive care. Fall, aspiration precautions. DVT prophylaxis   Code Status: Full Code  Subjective: Patient is seen and examined today morning. His abdominal distention persists, tolerates liquids. Wishes to start soft diet from tomorrow. Yesterday he had dark vomitus. Currently denies nausea or vomiting. Able to ambulate.   Physical Exam: Vitals:   08/17/23 1701 08/17/23 2056 08/18/23 0426 08/18/23 0735  BP: 133/84 (!) 140/83 119/81 124/86  Pulse: 82 84 80 82  Resp:  20 20 16   Temp:  99.9 F (37.7 C) 98.9 F (37.2 C) 98.6 F (37 C)  TempSrc:  Oral Oral Oral  SpO2: 98% 99% 97% 98%    General - Elderly African American male, no acute distress. HEENT - PERRLA, EOMI, atraumatic head, non tender sinuses. Lung - Clear, diffuse rhonchi, no wheezes. Heart - S1, S2 heard, no murmurs, rubs, trace pedal edema. Abdomen - Soft, non tender, distended, bowel sounds good Neuro - Alert, awake and oriented x 3, non focal exam. Skin - Warm and dry.  Data Reviewed:      Latest Ref Rng & Units 08/18/2023    6:26 AM 08/17/2023    5:12 PM 08/17/2023    6:44 AM  CBC  WBC 4.0 - 10.5 K/uL 8.8   8.4   Hemoglobin 13.0 - 17.0 g/dL 81.1  91.4  78.2   Hematocrit 39.0 - 52.0 % 33.1  34.5  33.7   Platelets 150 - 400 K/uL 289   262       Latest Ref Rng & Units 08/18/2023    6:26 AM 08/17/2023    6:44 AM 08/16/2023    7:41 AM  BMP  Glucose 70 - 99 mg/dL 161  096  045   BUN 8 - 23 mg/dL 11  12  11    Creatinine 0.61 - 1.24 mg/dL 4.09  8.11  9.14   Sodium 135 - 145 mmol/L 129  129  128   Potassium 3.5 - 5.1 mmol/L 3.9  3.5  3.2   Chloride 98 - 111 mmol/L 91  93  89   CO2 22 - 32 mmol/L 28  25  27    Calcium 8.9 - 10.3 mg/dL 8.3  8.4  8.5    No results found.  Family Communication: Discussed with patient, he understand and agree. All questions  answereed.  Disposition: Status is: Inpatient Remains inpatient appropriate because: Advance diet as tolerated.  Planned Discharge Destination: Home with Home Health     Time spent: 37 minutes  Author: Marcelino Duster, MD 08/18/2023 2:10 PM Secure chat 7am to 7pm For on call review [email or web address].

## 2023-08-18 NOTE — Plan of Care (Signed)

## 2023-08-19 ENCOUNTER — Inpatient Hospital Stay (HOSPITAL_COMMUNITY): Payer: Medicare Other

## 2023-08-19 DIAGNOSIS — K56609 Unspecified intestinal obstruction, unspecified as to partial versus complete obstruction: Secondary | ICD-10-CM

## 2023-08-19 DIAGNOSIS — R109 Unspecified abdominal pain: Secondary | ICD-10-CM | POA: Diagnosis not present

## 2023-08-19 DIAGNOSIS — D649 Anemia, unspecified: Secondary | ICD-10-CM

## 2023-08-19 DIAGNOSIS — K567 Ileus, unspecified: Secondary | ICD-10-CM | POA: Diagnosis not present

## 2023-08-19 LAB — CBC WITH DIFFERENTIAL/PLATELET
Abs Immature Granulocytes: 0.07 10*3/uL (ref 0.00–0.07)
Basophils Absolute: 0 10*3/uL (ref 0.0–0.1)
Basophils Relative: 0 %
Eosinophils Absolute: 0.1 10*3/uL (ref 0.0–0.5)
Eosinophils Relative: 1 %
HCT: 31.9 % — ABNORMAL LOW (ref 39.0–52.0)
Hemoglobin: 11.1 g/dL — ABNORMAL LOW (ref 13.0–17.0)
Immature Granulocytes: 1 %
Lymphocytes Relative: 21 %
Lymphs Abs: 1.4 10*3/uL (ref 0.7–4.0)
MCH: 28.8 pg (ref 26.0–34.0)
MCHC: 34.8 g/dL (ref 30.0–36.0)
MCV: 82.9 fL (ref 80.0–100.0)
Monocytes Absolute: 0.8 10*3/uL (ref 0.1–1.0)
Monocytes Relative: 12 %
Neutro Abs: 4.4 10*3/uL (ref 1.7–7.7)
Neutrophils Relative %: 65 %
Platelets: 307 10*3/uL (ref 150–400)
RBC: 3.85 MIL/uL — ABNORMAL LOW (ref 4.22–5.81)
RDW: 12.7 % (ref 11.5–15.5)
WBC: 6.8 10*3/uL (ref 4.0–10.5)
nRBC: 0 % (ref 0.0–0.2)

## 2023-08-19 LAB — HEPATIC FUNCTION PANEL
ALT: 44 U/L (ref 0–44)
AST: 29 U/L (ref 15–41)
Albumin: 2.6 g/dL — ABNORMAL LOW (ref 3.5–5.0)
Alkaline Phosphatase: 82 U/L (ref 38–126)
Bilirubin, Direct: 0.2 mg/dL (ref 0.0–0.2)
Indirect Bilirubin: 0.9 mg/dL (ref 0.3–0.9)
Total Bilirubin: 1.1 mg/dL (ref 0.0–1.2)
Total Protein: 6 g/dL — ABNORMAL LOW (ref 6.5–8.1)

## 2023-08-19 LAB — BASIC METABOLIC PANEL
Anion gap: 9 (ref 5–15)
BUN: 8 mg/dL (ref 8–23)
CO2: 26 mmol/L (ref 22–32)
Calcium: 8.6 mg/dL — ABNORMAL LOW (ref 8.9–10.3)
Chloride: 96 mmol/L — ABNORMAL LOW (ref 98–111)
Creatinine, Ser: 1.16 mg/dL (ref 0.61–1.24)
GFR, Estimated: >60 mL/min (ref >60)
Glucose, Bld: 106 mg/dL — ABNORMAL HIGH (ref 70–99)
Potassium: 3.7 mmol/L (ref 3.5–5.1)
Sodium: 131 mmol/L — ABNORMAL LOW (ref 135–145)

## 2023-08-19 LAB — PHOSPHORUS: Phosphorus: 3.4 mg/dL (ref 2.5–4.6)

## 2023-08-19 LAB — MAGNESIUM: Magnesium: 1.9 mg/dL (ref 1.7–2.4)

## 2023-08-19 MED ORDER — ALUM & MAG HYDROXIDE-SIMETH 200-200-20 MG/5ML PO SUSP
30.0000 mL | ORAL | Status: DC | PRN
  Administered 2023-08-19 – 2023-08-20 (×2): 30 mL via ORAL
  Filled 2023-08-19 (×2): qty 30

## 2023-08-19 MED ORDER — POTASSIUM CHLORIDE 20 MEQ PO PACK
40.0000 meq | PACK | Freq: Once | ORAL | Status: AC
  Administered 2023-08-19: 40 meq via ORAL
  Filled 2023-08-19: qty 2

## 2023-08-19 MED ORDER — ENOXAPARIN SODIUM 40 MG/0.4ML IJ SOSY
40.0000 mg | PREFILLED_SYRINGE | INTRAMUSCULAR | Status: DC
  Administered 2023-08-19: 40 mg via SUBCUTANEOUS
  Filled 2023-08-19: qty 0.4

## 2023-08-19 MED ORDER — FENTANYL CITRATE PF 50 MCG/ML IJ SOSY
12.5000 ug | PREFILLED_SYRINGE | INTRAMUSCULAR | Status: DC | PRN
  Administered 2023-08-19 – 2023-08-21 (×8): 25 ug via INTRAVENOUS
  Filled 2023-08-19 (×9): qty 1

## 2023-08-19 MED ORDER — METHYLNALTREXONE BROMIDE 12 MG/0.6ML ~~LOC~~ SOLN
12.0000 mg | Freq: Once | SUBCUTANEOUS | Status: AC
  Administered 2023-08-19: 12 mg via SUBCUTANEOUS
  Filled 2023-08-19: qty 0.6

## 2023-08-19 MED ORDER — POLYETHYLENE GLYCOL 3350 17 G PO PACK: 17.0000 g | PACK | Freq: Every day | ORAL | Status: DC

## 2023-08-19 NOTE — Progress Notes (Signed)
PROGRESS NOTE    [name]  ZOX:[id number] DOB: 1946/06/15 DOA: 08/12/2023 PCP: Bradd Canary, MD   Brief Narrative:  [name] is a 78 y.o. male with medical history significant of HTN, HLD, hx of SBO presenting to the ED with abdominal pain. Patient reports that he has been having abdominal pain since Monday. Abdominal pain is periumbilical and does not radiate anywhere else. He had normal fully-formed BMs on Sunday, but on Monday began noticing very loose stools. Reports going about 5-6 times per day since Monday. Does endorse some associated dry heaving, but otherwise no vomiting.    Patient is admitted to the hospitalist service for evaluation of partial small bowel obstruction versus enteritis.  GI panel came back positive for norovirus.  Patient is currently able to tolerate full liquid diet, on conservative management, surgery team followed advised to advance diet, no surgical intervention. GI involved yesterday for persistent ileus advised reglan, miralax, avoid opiates and patient remains a clinical diet.  Given his continued abdominal distention and abdominal pain we will repeat a CT scan of the abdomen pelvis today  Assessment and Plan:  Partial Small Bowel Obstruction versus ileus Norovirus Enteritis -Continue Supportive Care. -Patient continues to have distended abdomen, is unable to tolerate soft diet, continues to be on liquid diet -Continues to have loose bowel movement. Walking with no difficulty. -Abd xray SBO protocol showed persistent but improved small bowel dilation. -Repeat CT scan of the abdomen pelvis given significant pain and distention today -General surgery team advised to advance diet, no indication for surgery. -GI evaluated and following recommending MiraLAX 17 g p.o. daily, metoclopramide 10 mg IV Q6 scheduled, recommending weaning narcotics but patient needed something for his IV for the scan so we will try him on IV fentanyl 12.5 to 25 mcg every 2 as  needed  -Continue to maintain potassium greater than 4 magnesium greater than 2 -Encouraged physical activity and ambulation and out of bed -Repeat KUB in the morning   Leukopenia, improved -Due to norovirus infection. -WBC Trend: Recent Labs  Lab 08/13/23 0625 08/14/23 0716 08/15/23 0726 08/16/23 0741 08/17/23 0644 08/18/23 0626 08/19/23 0623  WBC 3.2* 3.7* 5.0 6.6 8.4 8.8 6.8  -Continue to Mon   Essential Hypertension -Blood pressure stable. -Continue Metoprolol Succinate 50 mg po Daily -Continue to Monitor BP per Protocol -Last BP reading was 109/86  Hyperlipidemia -C/w Rosuvastatin 20 mg po Daily   Hyponatremia -In the setting of Hypovolemia; Na+ Trend: Recent Labs  Lab 08/13/23 0625 08/14/23 0716 08/15/23 0726 08/16/23 0741 08/17/23 0644 08/18/23 0626 08/19/23 0623  NA 135 131* 128* 128* 129* 129* 131*  -Able to tolerate po Liquids and Salt tabs ordered and getting them 1 gram po BID -Continue to Monitor and Trend and repeat CMP in the AM   Hypokalemia, improved -Patient's K+ Level Trend: Recent Labs  Lab 08/13/23 0625 08/14/23 0716 08/15/23 0726 08/16/23 0741 08/17/23 0644 08/18/23 0626 08/19/23 0623  K 3.8 4.1 3.8 3.2* 3.5 3.9 3.7  -Continue to Monitor and Replete as Necessary -Repeat CMP in the AM   Hypomagnesemia, improved -Patient's Mag Level Trend: Recent Labs  Lab 08/13/23 0828 08/14/23 0716 08/16/23 0741 08/19/23 0623  MG 1.6* 1.9 2.1 1.9  -Continue to Monitor and Replete as Necessary -Repeat Mag in the AM   Normocytic Anemia -Hgb/Hct Trend: Recent Labs  Lab 08/14/23 0716 08/15/23 0726 08/16/23 0741 08/17/23 0644 08/17/23 1712 08/18/23 0626 08/19/23 0623  HGB 11.4* 11.6* 11.5* 11.7* 12.0* 11.3* 11.1*  HCT 33.7* 33.2* 32.8* 33.7* 34.5* 33.1* 31.9*  MCV 83.6 81.2 81.2 81.4  --  82.3 82.9  -Check Anemia Panel in the AM -Continue to Monitor for S/Sx of Bleeding; No overt bleeding noted -Repeat CBC in the AM    Hypoalbuminemia -Patient's Albumin Trend: Recent Labs  Lab 08/11/23 1046 08/12/23 0927 08/19/23 0623  ALBUMIN 4.3 3.5 2.6*  -Continue to Monitor and Trend and repeat CMP in the AM  Class I Obesity -Complicates overall prognosis and care -Estimated body mass index is 30.71 kg/m as calculated from the following:   Height as of 08/11/23: 5\' 10"  (1.778 m).   Weight as of 08/11/23: 97.1 kg.  -Weight Loss and Dietary Counseling given   DVT prophylaxis: SCDs and will add enoxaparin subcu    Code Status: Full Code Family Communication: No family currently at bedside  Disposition Plan:  Level of care: Med-Surg Status is: Inpatient Remains inpatient appropriate because: His further clinical improvement and clearance by the specialist   Consultants:  Gastroenterology General Surgery  Procedures:  As delineated as above  Antimicrobials:  Anti-infectives (From admission, onward)    None       Subjective: Seen and examined at bedside and continues to have abdominal discomfort and distention.  Continues to have significant amount of stools and it is starting to become a little formed.  No nausea or vomiting but states that he is "dry heaving".  No other concerns or complaints at time.  Objective: Vitals:   08/18/23 1953 08/19/23 0532 08/19/23 0532 08/19/23 0915  BP: 118/85 133/74 133/74 109/86  Pulse: 87 81 81 95  Resp: 17 17 17 17   Temp: 99.2 F (37.3 C) 99.5 F (37.5 C) 99.5 F (37.5 C) 98.7 F (37.1 C)  TempSrc: Oral Oral Oral Oral  SpO2: 99% 99% 99% 100%    Intake/Output Summary (Last 24 hours) at 08/19/2023 1425 Last data filed at 08/18/2023 2038 Gross per 24 hour  Intake 237 ml  Output --  Net 237 ml   There were no vitals filed for this visit.  Examination: Physical Exam:  Constitutional: WN/WD chronically ill-appearing African-American male Respiratory: Diminished to auscultation bilaterally, no wheezing, rales, rhonchi or crackles. Normal respiratory  effort and patient is not tachypenic. No accessory muscle use.  Unlabored breathing Cardiovascular: RRR, no murmurs / rubs / gallops. S1 and S2 auscultated. No extremity edema. Abdomen: Soft, a little under to palpate. Distended secondary body habitus and is  hypertympanic to palpate. Bowel sounds positive.  GU: Deferred. Musculoskeletal: No clubbing / cyanosis of digits/nails. No joint deformity upper and lower extremities. Skin: No rashes, lesions, ulcers on limited skin evaluation. No induration; Warm and dry.  Neurologic: CN 2-12 grossly intact with no focal deficits. Romberg sign and cerebellar reflexes not assessed.  Psychiatric: Normal judgment and insight. Alert and oriented x 3. Normal mood and appropriate affect.   Data Reviewed: I have personally reviewed following labs and imaging studies  CBC: Recent Labs  Lab 08/15/23 0726 08/16/23 0741 08/17/23 0644 08/17/23 1712 08/18/23 0626 08/19/23 0623  WBC 5.0 6.6 8.4  --  8.8 6.8  NEUTROABS  --   --   --   --   --  4.4  HGB 11.6* 11.5* 11.7* 12.0* 11.3* 11.1*  HCT 33.2* 32.8* 33.7* 34.5* 33.1* 31.9*  MCV 81.2 81.2 81.4  --  82.3 82.9  PLT 185 232 262  --  289 307   Basic Metabolic Panel: Recent Labs  Lab 08/13/23 0828 08/14/23 0716  08/15/23 0726 08/16/23 0741 08/17/23 0644 08/18/23 0626 08/19/23 0623  NA  --  131* 128* 128* 129* 129* 131*  K  --  4.1 3.8 3.2* 3.5 3.9 3.7  CL  --  98 94* 89* 93* 91* 96*  CO2  --  22 22 27 25 28 26   GLUCOSE  --  97 105* 116* 107* 118* 106*  BUN  --  13 10 11 12 11 8   CREATININE  --  1.02 0.87 1.01 0.99 0.99 1.16  CALCIUM  --  8.5* 8.5* 8.5* 8.4* 8.3* 8.6*  MG 1.6* 1.9  --  2.1  --   --  1.9  PHOS  --  2.1*  --  2.7  --   --  3.4   GFR: Estimated Creatinine Clearance: 64.3 mL/min (by C-G formula based on SCr of 1.16 mg/dL). Liver Function Tests: Recent Labs  Lab 08/19/23 0623  AST 29  ALT 44  ALKPHOS 82  BILITOT 1.1  PROT 6.0*  ALBUMIN 2.6*   No results for input(s):  "LIPASE", "AMYLASE" in the last 168 hours.  No results for input(s): "AMMONIA" in the last 168 hours. Coagulation Profile: No results for input(s): "INR", "PROTIME" in the last 168 hours. Cardiac Enzymes: No results for input(s): "CKTOTAL", "CKMB", "CKMBINDEX", "TROPONINI" in the last 168 hours. BNP (last 3 results) No results for input(s): "PROBNP" in the last 8760 hours. HbA1C: No results for input(s): "HGBA1C" in the last 72 hours. CBG: No results for input(s): "GLUCAP" in the last 168 hours. Lipid Profile: No results for input(s): "CHOL", "HDL", "LDLCALC", "TRIG", "CHOLHDL", "LDLDIRECT" in the last 72 hours. Thyroid Function Tests: No results for input(s): "TSH", "T4TOTAL", "FREET4", "T3FREE", "THYROIDAB" in the last 72 hours. Anemia Panel: No results for input(s): "VITAMINB12", "FOLATE", "FERRITIN", "TIBC", "IRON", "RETICCTPCT" in the last 72 hours. Sepsis Labs: No results for input(s): "PROCALCITON", "LATICACIDVEN" in the last 168 hours.  Recent Results (from the past 240 hours)  Gastrointestinal Panel by PCR , Stool     Status: Abnormal   Collection Time: 08/12/23 10:35 PM   Specimen: Stool  Result Value Ref Range Status   Campylobacter species NOT DETECTED NOT DETECTED Final   Plesimonas shigelloides NOT DETECTED NOT DETECTED Final   Salmonella species NOT DETECTED NOT DETECTED Final   Yersinia enterocolitica NOT DETECTED NOT DETECTED Final   Vibrio species NOT DETECTED NOT DETECTED Final   Vibrio cholerae NOT DETECTED NOT DETECTED Final   Enteroaggregative E coli (EAEC) NOT DETECTED NOT DETECTED Final   Enteropathogenic E coli (EPEC) NOT DETECTED NOT DETECTED Final   Enterotoxigenic E coli (ETEC) NOT DETECTED NOT DETECTED Final   Shiga like toxin producing E coli (STEC) NOT DETECTED NOT DETECTED Final   Shigella/Enteroinvasive E coli (EIEC) NOT DETECTED NOT DETECTED Final   Cryptosporidium NOT DETECTED NOT DETECTED Final   Cyclospora cayetanensis NOT DETECTED NOT  DETECTED Final   Entamoeba histolytica NOT DETECTED NOT DETECTED Final   Giardia lamblia NOT DETECTED NOT DETECTED Final   Adenovirus F40/41 NOT DETECTED NOT DETECTED Final   Astrovirus NOT DETECTED NOT DETECTED Final   Norovirus GI/GII DETECTED (A) NOT DETECTED Final    Comment: RESULT CALLED TO, READ BACK BY AND VERIFIED WITH: LEFT VOICEMAIL 0925 08/13/2023 L[facility] WHITE AT 5409 08/14/23.PMF    Rotavirus A NOT DETECTED NOT DETECTED Final   Sapovirus (I, II, IV, and V) NOT DETECTED NOT DETECTED Final    Comment: Performed at [facility], [address] [place], [place]  [address]    Radiology Studies: DG Abd Portable 1V Result Date: 08/19/2023 CLINICAL DATA:  Ileus.  Abdominal pain. EXAM: PORTABLE ABDOMEN - 1 VIEW COMPARISON:  August 18, 2023. FINDINGS: Stable small bowel dilatation.  No definite colonic dilatation. IMPRESSION: Stable small bowel dilatation concerning for possible obstruction or ileus. Electronically Signed   By: Lupita Raider M.D.   On: 08/19/2023 14:10   DG Abd 1 View Result Date: 08/18/2023 CLINICAL DATA:  Follow-up ileus EXAM: ABDOMEN - 1 VIEW COMPARISON:  08/16/2023 FINDINGS: Persistent small bowel dilatation is noted. It may be slightly greater than that seen on the prior exam. No free air is noted. No bony abnormality is seen. IMPRESSION: Slight increase in the degree of small-bowel dilatation. Electronically Signed   By: Alcide Clever M.D.   On: 08/18/2023 18:57   Scheduled Meds:  feeding supplement  1 Container Oral TID BM   metoCLOPramide (REGLAN) injection  10 mg Intravenous Q6H   metoprolol succinate  50 mg Oral Daily   pantoprazole (PROTONIX) IV  40 mg Intravenous Q24H   polyethylene glycol  17 g Oral Daily   rosuvastatin  20 mg Oral Daily   sodium chloride  1 g Oral BID WC   Continuous Infusions:   LOS: 6 days   Marguerita Merles, DO [facility] Available via Epic secure chat 7am-7pm After these hours, please refer to coverage  provider listed on [email or web address] 08/19/2023, 2:25 PM

## 2023-08-19 NOTE — Hospital Course (Addendum)
[name] is a 78 y.o. male with medical history significant of HTN, HLD, hx of SBO presenting to the ED with abdominal pain. Patient reports that he has been having abdominal pain since Monday. Abdominal pain is periumbilical and does not radiate anywhere else. He had normal fully-formed BMs on Sunday, but on Monday began noticing very loose stools. Reports going about 5-6 times per day since Monday. Does endorse some associated dry heaving, but otherwise no vomiting.    Patient is admitted to the hospitalist service for evaluation of partial small bowel obstruction versus enteritis.  GI panel came back positive for norovirus.  Patient is currently able to tolerate full liquid diet, on conservative management, surgery team followed advised to advance diet, no surgical intervention. GI involved yesterday for persistent ileus advised reglan, miralax, avoid opiates and patient remains a clinical diet.  Given his continued abdominal distention and abdominal pain we will repeat a CT scan of the abdomen pelvis today  Assessment and Plan:  Partial Small Bowel Obstruction versus ileus Norovirus Enteritis -Continue Supportive Care. -Patient continues to have distended abdomen, is unable to tolerate soft diet, continues to be on liquid diet -Continues to have loose bowel movement. Walking with no difficulty. -Abd xray SBO protocol showed persistent but improved small bowel dilation. -Repeat CT scan of the abdomen pelvis given significant pain and distention today -General surgery team advised to advance diet, no indication for surgery. -GI evaluated and following recommending MiraLAX 17 g p.o. daily, metoclopramide 10 mg IV Q6 scheduled, recommending weaning narcotics but patient needed something for his IV for the scan so we will try him on IV fentanyl 12.5 to 25 mcg every 2 as needed  -Continue to maintain potassium greater than 4 magnesium greater than 2 -Encouraged physical activity and ambulation and  out of bed -Repeat KUB in the morning   Leukopenia, improved -Due to norovirus infection. -WBC Trend: Recent Labs  Lab 08/13/23 0625 08/14/23 0716 08/15/23 0726 08/16/23 0741 08/17/23 0644 08/18/23 0626 08/19/23 0623  WBC 3.2* 3.7* 5.0 6.6 8.4 8.8 6.8  -Continue to Mon   Essential Hypertension -Blood pressure stable. -Continue Metoprolol Succinate 50 mg po Daily -Continue to Monitor BP per Protocol -Last BP reading was 109/86  Hyperlipidemia -C/w Rosuvastatin 20 mg po Daily   Hyponatremia -In the setting of Hypovolemia; Na+ Trend: Recent Labs  Lab 08/13/23 0625 08/14/23 0716 08/15/23 0726 08/16/23 0741 08/17/23 0644 08/18/23 0626 08/19/23 0623  NA 135 131* 128* 128* 129* 129* 131*  -Able to tolerate po Liquids and Salt tabs ordered and getting them 1 gram po BID -Continue to Monitor and Trend and repeat CMP in the AM   Hypokalemia, improved -Patient's K+ Level Trend: Recent Labs  Lab 08/13/23 0625 08/14/23 0716 08/15/23 0726 08/16/23 0741 08/17/23 0644 08/18/23 0626 08/19/23 0623  K 3.8 4.1 3.8 3.2* 3.5 3.9 3.7  -Continue to Monitor and Replete as Necessary -Repeat CMP in the AM   Hypomagnesemia, improved -Patient's Mag Level Trend: Recent Labs  Lab 08/13/23 0828 08/14/23 0716 08/16/23 0741 08/19/23 0623  MG 1.6* 1.9 2.1 1.9  -Continue to Monitor and Replete as Necessary -Repeat Mag in the AM   Normocytic Anemia -Hgb/Hct Trend: Recent Labs  Lab 08/14/23 0716 08/15/23 0726 08/16/23 0741 08/17/23 0644 08/17/23 1712 08/18/23 0626 08/19/23 0623  HGB 11.4* 11.6* 11.5* 11.7* 12.0* 11.3* 11.1*  HCT 33.7* 33.2* 32.8* 33.7* 34.5* 33.1* 31.9*  MCV 83.6 81.2 81.2 81.4  --  82.3 82.9  -Check Anemia Panel  in the AM -Continue to Monitor for S/Sx of Bleeding; No overt bleeding noted -Repeat CBC in the AM   Hypoalbuminemia -Patient's Albumin Trend: Recent Labs  Lab 08/11/23 1046 08/12/23 0927 08/19/23 0623  ALBUMIN 4.3 3.5 2.6*  -Continue  to Monitor and Trend and repeat CMP in the AM  Class I Obesity -Complicates overall prognosis and care -Estimated body mass index is 30.71 kg/m as calculated from the following:   Height as of 08/11/23: 5\' 10"  (1.778 m).   Weight as of 08/11/23: 97.1 kg.  -Weight Loss and Dietary Counseling given

## 2023-08-19 NOTE — Progress Notes (Signed)
[facility] Progress Note  CC: Abdominal distention, ileus  Subjective: He developed significant lower abdominal pain approximately 2 hours after eating a clear liquid this.  He feels his abdomen is more distended and he passed only a small amount of gas per the rectum today.  He noted passing a lot more gas yesterday.  No bowel movement today and stated he passed small amounts of loose stool with bright red blood on Monday and Tuesday.  No rectal pain.  No nausea or vomiting.  No chest pain or shortness of breath.  He received fentanyl prior to proceeding with the CT scan, he is trying to avoid pain medications as he knows this can worsen his ileus.  Has ambulated up in the hall several times today.   Objective:  Vital signs in last 24 hours: Temp:  [98.7 F (37.1 C)-99.9 F (37.7 C)] 98.7 F (37.1 C) (02/19 0915) Pulse Rate:  [81-95] 95 (02/19 0915) Resp:  [17-18] 17 (02/19 0915) BP: (109-133)/(74-86) 109/86 (02/19 0915) SpO2:  [99 %-100 %] 100 % (02/19 0915) Last BM Date : 08/18/23 General: 78 year old male in no acute distress. Heart: Rate and rhythm, no murmurs. Pulm: Breath sounds clear throughout. Abdomen: Abdomen is grossly distended with tenderness to the central mid abdomen and throughout the lower abdomen without rebound or guarding.Tinkering bowel sounds to all 4 quadrants. Extremities: No edema. Neurologic:  Alert and  oriented x 4. Grossly normal neurologically. Psych:  Alert and cooperative. Normal mood and affect.  Intake/Output from previous day: 02/18 0701 - 02/19 0700 In: 237 [P.O.:237] Out: -  Intake/Output this shift: No intake/output data recorded.  Lab Results: Recent Labs    08/17/23 0644 08/17/23 1712 08/18/23 0626 08/19/23 0623  WBC 8.4  --  8.8 6.8  HGB 11.7* 12.0* 11.3* 11.1*  HCT 33.7* 34.5* 33.1* 31.9*  PLT 262  --  289 307   BMET Recent Labs    08/17/23 0644 08/18/23 0626 08/19/23 0623  NA 129* 129* 131*  K 3.5  3.9 3.7  CL 93* 91* 96*  CO2 25 28 26   GLUCOSE 107* 118* 106*  BUN 12 11 8   CREATININE 0.99 0.99 1.16  CALCIUM 8.4* 8.3* 8.6*   LFT Recent Labs    08/19/23 0623  PROT 6.0*  ALBUMIN 2.6*  AST 29  ALT 44  ALKPHOS 82  BILITOT 1.1  BILIDIR 0.2  IBILI 0.9   PT/INR No results for input(s): "LABPROT", "INR" in the last 72 hours. Hepatitis Panel No results for input(s): "HEPBSAG", "HCVAB", "HEPAIGM", "HEPBIGM" in the last 72 hours.  DG Abd Portable 1V Result Date: 08/19/2023 CLINICAL DATA:  Ileus.  Abdominal pain. EXAM: PORTABLE ABDOMEN - 1 VIEW COMPARISON:  August 18, 2023. FINDINGS: Stable small bowel dilatation.  No definite colonic dilatation. IMPRESSION: Stable small bowel dilatation concerning for possible obstruction or ileus. Electronically Signed   By: Lupita Raider M.D.   On: 08/19/2023 14:10   DG Abd 1 View Result Date: 08/18/2023 CLINICAL DATA:  Follow-up ileus EXAM: ABDOMEN - 1 VIEW COMPARISON:  08/16/2023 FINDINGS: Persistent small bowel dilatation is noted. It may be slightly greater than that seen on the prior exam. No free air is noted. No bony abnormality is seen. IMPRESSION: Slight increase in the degree of small-bowel dilatation. Electronically Signed   By: Alcide Clever M.D.   On: 08/18/2023 18:57    Assessment / Plan:  78 year old male with history of ventral hernia repair presented with nausea, vomiting, abdominal  pain, and diarrhea. GI pathogen panel was positive for norovirus. We were consulted for persistent ileus versus partial small bowel obstruction. CT upon arrival showed dilation of the mid to distal jejunum that is concerning for ileus versus partial small bowel obstruction.  Small bowel protocol with oral contrast showed contrast that traversed to the colon.  Seen by general surgery who recommended conservative management and surgical intervention was not required.  Persistent abdominal pain and distention. Abdominal x-ray today showed stable small bowel  dilatation concerning for possible obstruction or ileus. Repeat CTAP without contrast showed diffusely dilated and fluid-filled small bowel with gradual transition from dilated to nondilated in the distal ileum without a specific transition point with diffuse distention of the ascending and transverse colon with air and fluid consistent with an ileus and scattered gas throughout the small bowel without free air or abscess.  K+ 3.7. Magnesium 1.9.  Received KCl 40 meq p.o. x 1. -Continue clear liquid diet -Pharm consult for Relistor dosing  -Daily KUBs -Continue trickle MiraLAX -Continue Reglan -Simethicone twice daily -Continue to wean narcotics -Maintain K greater than 4 and Mg greater than 2 -Continue to encourage physical activity in order to stimulate bowel activity   Normocytic anemia.  Hemoglobin 12 -> 11.1 -> 11.1.  Patient endorses passing bright red blood with loose bowel movements on 2/17 and 2/18. Last colonoscopy 07/2021 with 2 small polyps removed and also noted multiple diverticuli. Path report showed 1 tubular adenoma and 1 nonpolyp both diminutive, no recall due to age.  -Monitor patient for GI bleeding -No plans for endoscopic evaluation at this time  Hyponatremia -Management per the hospitalist  Principal Problem:   Ileus [facility]) Active Problems:   SBO (small bowel obstruction) (HCC)     LOS: 6 days   [name]  08/19/2023, 4:57 PM

## 2023-08-19 NOTE — Plan of Care (Signed)

## 2023-08-19 NOTE — Evaluation (Signed)
Occupational Therapy Evaluation Patient Details Name: [name] MRN: [id number] DOB: Oct 29, 1945 Today's Date: 08/19/2023   History of Present Illness   Pt is a 78 y/o M presenting to ED On 2/12 wtih abdominal pain, admitted for SBO vs enteritis, GI +norovirus. PMH includes HTN, HLD, SBO, back pain, prediabetes, osteoporosis, L TSA, R reverse shoulder arthroplasty, R TKA     Clinical Impressions Pt ind at baseline with ADLs/functional mobility lives with spouse who can assist at d/c if needed. Pt currently with abdominal discomfort but mobilizes independently in room, performs ADLs without assist. Encouraged pt to continue to mobilize OOB throughout hospital admission. Pt presenting with impairments listed below, however has no acute OT needs at this time, will s/o. Please reconsult if there is a change in pt status. No follow up OT needs at d/c.     If plan is discharge home, recommend the following:         Functional Status Assessment   Patient has had a recent decline in their functional status and demonstrates the ability to make significant improvements in function in a reasonable and predictable amount of time.     Equipment Recommendations   None recommended by OT     Recommendations for Other Services         Precautions/Restrictions   Precautions Precautions: None Restrictions Weight Bearing Restrictions Per Provider Order: No     Mobility Bed Mobility               General bed mobility comments: seated EOB upon arrival, in bathroom upon departure    Transfers Overall transfer level: Independent                        Balance Overall balance assessment: No apparent balance deficits (not formally assessed)                                         ADL either performed or assessed with clinical judgement   ADL Overall ADL's : Independent                                             Vision    Vision Assessment?: No apparent visual deficits     Perception Perception: Not tested       Praxis Praxis: Not tested       Pertinent Vitals/Pain Pain Assessment Pain Assessment: No/denies pain     Extremity/Trunk Assessment Upper Extremity Assessment Upper Extremity Assessment: Overall WFL for tasks assessed   Lower Extremity Assessment Lower Extremity Assessment: Overall WFL for tasks assessed   Cervical / Trunk Assessment Cervical / Trunk Assessment: Normal   Communication Communication Communication: No apparent difficulties   Cognition Arousal: Alert Behavior During Therapy: WFL for tasks assessed/performed Cognition: No apparent impairments                               Following commands: Intact       Cueing  General Comments          Exercises     Shoulder Instructions      Home Living Family/patient expects to be discharged to:: Private residence Living Arrangements: Spouse/significant other Available Help at Discharge:  Family;Available 24 hours/day Type of Home: House Home Access: Stairs to enter [employer] of Steps: 2   Home Layout: Two level;Bed/bath upstairs Alternate Level Stairs-Number of Steps: 14 Alternate Level Stairs-Rails: Can reach both Bathroom Shower/Tub: Producer, television/film/video: Handicapped height Bathroom Accessibility: Yes   Home Equipment: Agricultural consultant (2 wheels);Cane - single point;BSC/3in1;Shower seat          Prior Functioning/Environment Prior Level of Function : Independent/Modified Independent;Driving             Mobility Comments: ind without AD ADLs Comments: ind, drives    OT Problem List:     OT Treatment/Interventions:        OT Goals(Current goals can be found in the care plan section)   Acute Rehab OT Goals Patient Stated Goal: none stated OT Goal Formulation: With patient Time For Goal Achievement: 09/02/23 Potential to Achieve Goals: Good   OT  Frequency:       Co-evaluation              AM-PAC OT "6 Clicks" Daily Activity     Outcome Measure Help from another person eating meals?: None Help from another person taking care of personal grooming?: None Help from another person toileting, which includes using toliet, bedpan, or urinal?: None Help from another person bathing (including washing, rinsing, drying)?: None Help from another person to put on and taking off regular upper body clothing?: None Help from another person to put on and taking off regular lower body clothing?: None 6 Click Score: 24   End of Session Nurse Communication: Mobility status  Activity Tolerance: Patient tolerated treatment well Patient left: Other (comment) (in bathroom)  OT Visit Diagnosis: Muscle weakness (generalized) (M62.81)                Time: 6578-4696 OT Time Calculation (min): 10 min Charges:  OT General Charges $OT Visit: 1 Visit OT Evaluation $OT Eval Low Complexity: 1 Low  Isla Sabree K, OTD, OTR/L SecureChat Preferred Acute Rehab [phone]   Carver Fila Koonce 08/19/2023, 9:42 AM

## 2023-08-20 ENCOUNTER — Inpatient Hospital Stay (HOSPITAL_COMMUNITY): Payer: Medicare Other

## 2023-08-20 ENCOUNTER — Other Ambulatory Visit: Payer: Self-pay

## 2023-08-20 ENCOUNTER — Encounter (HOSPITAL_COMMUNITY)
Admission: EM | Disposition: A | Discharge status: Home / Self Care | Payer: Self-pay | Source: Home / Self Care | Attending: Internal Medicine

## 2023-08-20 ENCOUNTER — Encounter (HOSPITAL_COMMUNITY): Payer: Self-pay | Admitting: Internal Medicine

## 2023-08-20 ENCOUNTER — Inpatient Hospital Stay (HOSPITAL_COMMUNITY): Payer: Medicare Other | Admitting: Anesthesiology

## 2023-08-20 DIAGNOSIS — K56609 Unspecified intestinal obstruction, unspecified as to partial versus complete obstruction: Secondary | ICD-10-CM | POA: Diagnosis not present

## 2023-08-20 DIAGNOSIS — R109 Unspecified abdominal pain: Secondary | ICD-10-CM | POA: Diagnosis not present

## 2023-08-20 DIAGNOSIS — R140 Abdominal distension (gaseous): Secondary | ICD-10-CM | POA: Diagnosis not present

## 2023-08-20 DIAGNOSIS — K668 Other specified disorders of peritoneum: Secondary | ICD-10-CM | POA: Diagnosis not present

## 2023-08-20 DIAGNOSIS — A419 Sepsis, unspecified organism: Secondary | ICD-10-CM | POA: Diagnosis not present

## 2023-08-20 DIAGNOSIS — E785 Hyperlipidemia, unspecified: Secondary | ICD-10-CM | POA: Diagnosis not present

## 2023-08-20 DIAGNOSIS — R112 Nausea with vomiting, unspecified: Secondary | ICD-10-CM | POA: Diagnosis not present

## 2023-08-20 DIAGNOSIS — K689 Other disorders of retroperitoneum: Secondary | ICD-10-CM

## 2023-08-20 DIAGNOSIS — K567 Ileus, unspecified: Secondary | ICD-10-CM | POA: Diagnosis not present

## 2023-08-20 DIAGNOSIS — K631 Perforation of intestine (nontraumatic): Secondary | ICD-10-CM

## 2023-08-20 DIAGNOSIS — R188 Other ascites: Secondary | ICD-10-CM

## 2023-08-20 DIAGNOSIS — D649 Anemia, unspecified: Secondary | ICD-10-CM | POA: Diagnosis not present

## 2023-08-20 HISTORY — PX: LAPAROTOMY: SHX154

## 2023-08-20 HISTORY — PX: APPLICATION OF WOUND VAC: SHX5189

## 2023-08-20 LAB — CBC WITH DIFFERENTIAL/PLATELET
Abs Immature Granulocytes: 0.1 10*3/uL — ABNORMAL HIGH (ref 0.00–0.07)
Basophils Absolute: 0 10*3/uL (ref 0.0–0.1)
Basophils Relative: 0 %
Eosinophils Absolute: 0 10*3/uL (ref 0.0–0.5)
Eosinophils Relative: 0 %
HCT: 33.8 % — ABNORMAL LOW (ref 39.0–52.0)
Hemoglobin: 11.8 g/dL — ABNORMAL LOW (ref 13.0–17.0)
Immature Granulocytes: 1 %
Lymphocytes Relative: 22 %
Lymphs Abs: 2 10*3/uL (ref 0.7–4.0)
MCH: 28.5 pg (ref 26.0–34.0)
MCHC: 34.9 g/dL (ref 30.0–36.0)
MCV: 81.6 fL (ref 80.0–100.0)
Monocytes Absolute: 0.7 10*3/uL (ref 0.1–1.0)
Monocytes Relative: 8 %
Neutro Abs: 6.2 10*3/uL (ref 1.7–7.7)
Neutrophils Relative %: 69 %
Platelets: 359 10*3/uL (ref 150–400)
RBC: 4.14 MIL/uL — ABNORMAL LOW (ref 4.22–5.81)
RDW: 12.7 % (ref 11.5–15.5)
WBC: 9 10*3/uL (ref 4.0–10.5)
nRBC: 0 % (ref 0.0–0.2)

## 2023-08-20 LAB — COMPREHENSIVE METABOLIC PANEL
ALT: 40 U/L (ref 0–44)
AST: 22 U/L (ref 15–41)
Albumin: 2.7 g/dL — ABNORMAL LOW (ref 3.5–5.0)
Alkaline Phosphatase: 83 U/L (ref 38–126)
Anion gap: 11 (ref 5–15)
BUN: 11 mg/dL (ref 8–23)
CO2: 26 mmol/L (ref 22–32)
Calcium: 8.7 mg/dL — ABNORMAL LOW (ref 8.9–10.3)
Chloride: 94 mmol/L — ABNORMAL LOW (ref 98–111)
Creatinine, Ser: 1.05 mg/dL (ref 0.61–1.24)
GFR, Estimated: >60 mL/min (ref >60)
Glucose, Bld: 110 mg/dL — ABNORMAL HIGH (ref 70–99)
Potassium: 4.1 mmol/L (ref 3.5–5.1)
Sodium: 131 mmol/L — ABNORMAL LOW (ref 135–145)
Total Bilirubin: 1.3 mg/dL — ABNORMAL HIGH (ref 0.0–1.2)
Total Protein: 6.3 g/dL — ABNORMAL LOW (ref 6.5–8.1)

## 2023-08-20 LAB — PHOSPHORUS: Phosphorus: 3.5 mg/dL (ref 2.5–4.6)

## 2023-08-20 LAB — ABO/RH: ABO/RH(D): A POS

## 2023-08-20 LAB — MAGNESIUM: Magnesium: 2 mg/dL (ref 1.7–2.4)

## 2023-08-20 LAB — MRSA NEXT GEN BY PCR, NASAL: MRSA by PCR Next Gen: NOT DETECTED

## 2023-08-20 SURGERY — LAPAROTOMY, EXPLORATORY
Anesthesia: General | Site: Abdomen

## 2023-08-20 MED ORDER — ACETAMINOPHEN 500 MG PO TABS: 1000.0000 mg | ORAL_TABLET | Freq: Four times a day (QID) | ORAL | Status: DC | PRN

## 2023-08-20 MED ORDER — MEPERIDINE HCL 25 MG/ML IJ SOLN: 6.2500 mg | INTRAMUSCULAR | Status: DC | PRN

## 2023-08-20 MED ORDER — ALBUMIN HUMAN 5 % IV SOLN
INTRAVENOUS | Status: DC | PRN
Start: 2023-08-20 — End: 2023-08-20

## 2023-08-20 MED ORDER — HYDROMORPHONE HCL 1 MG/ML IJ SOLN
INTRAMUSCULAR | Status: AC
  Filled 2023-08-20: qty 0.5

## 2023-08-20 MED ORDER — FENTANYL CITRATE (PF) 250 MCG/5ML IJ SOLN
INTRAMUSCULAR | Status: AC
  Filled 2023-08-20: qty 5

## 2023-08-20 MED ORDER — SODIUM CHLORIDE 1 G PO TABS: 1.0000 g | ORAL_TABLET | Freq: Two times a day (BID) | ORAL | Status: DC

## 2023-08-20 MED ORDER — DEXAMETHASONE SODIUM PHOSPHATE 10 MG/ML IJ SOLN
INTRAMUSCULAR | Status: DC | PRN
  Administered 2023-08-20: 4 mg via INTRAVENOUS

## 2023-08-20 MED ORDER — PIPERACILLIN-TAZOBACTAM 3.375 G IVPB 30 MIN
3.3750 g | Freq: Once | INTRAVENOUS | Status: AC
  Administered 2023-08-20: 3.375 g via INTRAVENOUS
  Filled 2023-08-20: qty 50

## 2023-08-20 MED ORDER — MIDAZOLAM HCL 2 MG/2ML IJ SOLN: 0.5000 mg | Freq: Once | INTRAMUSCULAR | Status: DC | PRN

## 2023-08-20 MED ORDER — FENTANYL CITRATE (PF) 100 MCG/2ML IJ SOLN: 25.0000 ug | Freq: Once | INTRAMUSCULAR | Status: AC

## 2023-08-20 MED ORDER — FENTANYL CITRATE (PF) 100 MCG/2ML IJ SOLN
25.0000 ug | Freq: Once | INTRAMUSCULAR | Status: AC
  Administered 2023-08-20: 25 ug via INTRAVENOUS

## 2023-08-20 MED ORDER — SUCCINYLCHOLINE CHLORIDE 200 MG/10ML IV SOSY
PREFILLED_SYRINGE | INTRAVENOUS | Status: DC | PRN
  Administered 2023-08-20: 100 mg via INTRAVENOUS

## 2023-08-20 MED ORDER — ACETAMINOPHEN 10 MG/ML IV SOLN
1000.0000 mg | Freq: Once | INTRAVENOUS | Status: AC
  Administered 2023-08-20: 1000 mg via INTRAVENOUS

## 2023-08-20 MED ORDER — LACTATED RINGERS IV SOLN: INTRAVENOUS | Status: AC

## 2023-08-20 MED ORDER — ONDANSETRON HCL 4 MG/2ML IJ SOLN
INTRAMUSCULAR | Status: DC | PRN
  Administered 2023-08-20: 4 mg via INTRAVENOUS

## 2023-08-20 MED ORDER — HYDROMORPHONE HCL 1 MG/ML IJ SOLN
INTRAMUSCULAR | Status: AC
  Filled 2023-08-20: qty 1

## 2023-08-20 MED ORDER — ACETAMINOPHEN 650 MG RE SUPP: 650.0000 mg | Freq: Four times a day (QID) | RECTAL | Status: DC | PRN

## 2023-08-20 MED ORDER — ORAL CARE MOUTH RINSE: 15.0000 mL | OROMUCOSAL | Status: DC | PRN

## 2023-08-20 MED ORDER — SUGAMMADEX SODIUM 200 MG/2ML IV SOLN
INTRAVENOUS | Status: DC | PRN
  Administered 2023-08-20: 200 mg via INTRAVENOUS

## 2023-08-20 MED ORDER — FENTANYL CITRATE (PF) 100 MCG/2ML IJ SOLN
INTRAMUSCULAR | Status: AC
  Administered 2023-08-20: 25 ug via INTRAVENOUS
  Filled 2023-08-20: qty 2

## 2023-08-20 MED ORDER — POTASSIUM CHLORIDE CRYS ER 20 MEQ PO TBCR: 40.0000 meq | EXTENDED_RELEASE_TABLET | Freq: Once | ORAL | Status: DC

## 2023-08-20 MED ORDER — CHLORHEXIDINE GLUCONATE 0.12 % MT SOLN
15.0000 mL | Freq: Once | OROMUCOSAL | Status: AC
  Administered 2023-08-20: 15 mL via OROMUCOSAL

## 2023-08-20 MED ORDER — 0.9 % SODIUM CHLORIDE (POUR BTL) OPTIME
TOPICAL | Status: DC | PRN
  Administered 2023-08-20: 2000 mL

## 2023-08-20 MED ORDER — HYDROMORPHONE HCL 1 MG/ML IJ SOLN
INTRAMUSCULAR | Status: DC | PRN
  Administered 2023-08-20: .5 mg via INTRAVENOUS

## 2023-08-20 MED ORDER — OXYCODONE HCL 5 MG PO TABS: 5.0000 mg | ORAL_TABLET | Freq: Once | ORAL | Status: DC | PRN

## 2023-08-20 MED ORDER — ROCURONIUM BROMIDE 10 MG/ML (PF) SYRINGE
PREFILLED_SYRINGE | INTRAVENOUS | Status: DC | PRN
  Administered 2023-08-20: 40 mg via INTRAVENOUS
  Administered 2023-08-20: 60 mg via INTRAVENOUS

## 2023-08-20 MED ORDER — ROSUVASTATIN CALCIUM 20 MG PO TABS: 20.0000 mg | ORAL_TABLET | Freq: Every day | ORAL | Status: DC

## 2023-08-20 MED ORDER — SUCCINYLCHOLINE CHLORIDE 200 MG/10ML IV SOSY
PREFILLED_SYRINGE | INTRAVENOUS | Status: AC
Start: 2023-08-20 — End: ?
  Filled 2023-08-20: qty 10

## 2023-08-20 MED ORDER — PROPOFOL 10 MG/ML IV BOLUS
INTRAVENOUS | Status: AC
  Filled 2023-08-20: qty 20

## 2023-08-20 MED ORDER — POLYETHYLENE GLYCOL 3350 17 G PO PACK: 17.0000 g | PACK | Freq: Every day | ORAL | Status: DC

## 2023-08-20 MED ORDER — ROCURONIUM BROMIDE 10 MG/ML (PF) SYRINGE
PREFILLED_SYRINGE | INTRAVENOUS | Status: AC
  Filled 2023-08-20: qty 10

## 2023-08-20 MED ORDER — FENTANYL CITRATE (PF) 250 MCG/5ML IJ SOLN
INTRAMUSCULAR | Status: DC | PRN
  Administered 2023-08-20 (×3): 50 ug via INTRAVENOUS
  Administered 2023-08-20: 100 ug via INTRAVENOUS

## 2023-08-20 MED ORDER — LIDOCAINE 2% (20 MG/ML) 5 ML SYRINGE
INTRAMUSCULAR | Status: AC
  Filled 2023-08-20: qty 5

## 2023-08-20 MED ORDER — PROPOFOL 10 MG/ML IV BOLUS
INTRAVENOUS | Status: DC | PRN
  Administered 2023-08-20: 130 mg via INTRAVENOUS

## 2023-08-20 MED ORDER — LACTATED RINGERS IV SOLN: INTRAVENOUS | Status: DC

## 2023-08-20 MED ORDER — HYDROMORPHONE HCL 1 MG/ML IJ SOLN
0.2500 mg | INTRAMUSCULAR | Status: DC | PRN
  Administered 2023-08-20 (×3): 0.5 mg via INTRAVENOUS

## 2023-08-20 MED ORDER — SIMETHICONE 80 MG PO CHEW: 80.0000 mg | CHEWABLE_TABLET | Freq: Two times a day (BID) | ORAL | Status: DC

## 2023-08-20 MED ORDER — CHLORHEXIDINE GLUCONATE CLOTH 2 % EX PADS
6.0000 | MEDICATED_PAD | Freq: Every day | CUTANEOUS | Status: DC
  Administered 2023-08-21 – 2023-08-28 (×7): 6 via TOPICAL

## 2023-08-20 MED ORDER — LIDOCAINE 2% (20 MG/ML) 5 ML SYRINGE
INTRAMUSCULAR | Status: DC | PRN
  Administered 2023-08-20: 60 mg via INTRAVENOUS

## 2023-08-20 MED ORDER — ACETAMINOPHEN 10 MG/ML IV SOLN
INTRAVENOUS | Status: AC
  Filled 2023-08-20: qty 100

## 2023-08-20 MED ORDER — ORAL CARE MOUTH RINSE: 15.0000 mL | Freq: Once | OROMUCOSAL | Status: AC

## 2023-08-20 MED ORDER — OXYCODONE HCL 5 MG/5ML PO SOLN: 5.0000 mg | Freq: Once | ORAL | Status: DC | PRN

## 2023-08-20 MED ORDER — ALUM & MAG HYDROXIDE-SIMETH 200-200-20 MG/5ML PO SUSP: 30.0000 mL | ORAL | Status: DC | PRN

## 2023-08-20 MED ORDER — PIPERACILLIN-TAZOBACTAM 3.375 G IVPB: 3.3750 g | Freq: Three times a day (TID) | INTRAVENOUS | Status: DC

## 2023-08-20 SURGICAL SUPPLY — 41 items
BIOPATCH RED 1 DISK 7.0 (GAUZE/BANDAGES/DRESSINGS) IMPLANT
BLADE CLIPPER SURG (BLADE) IMPLANT
CANISTER SUCT 3000ML PPV (MISCELLANEOUS) ×2 IMPLANT
CANISTER WOUND CARE 500ML ATS (WOUND CARE) IMPLANT
CHLORAPREP W/TINT 26 (MISCELLANEOUS) ×2 IMPLANT
COVER SURGICAL LIGHT HANDLE (MISCELLANEOUS) ×2 IMPLANT
DRAPE LAPAROSCOPIC ABDOMINAL (DRAPES) ×2 IMPLANT
DRAPE WARM FLUID 44X44 (DRAPES) ×2 IMPLANT
DRSG OPSITE POSTOP 4X10 (GAUZE/BANDAGES/DRESSINGS) IMPLANT
DRSG OPSITE POSTOP 4X8 (GAUZE/BANDAGES/DRESSINGS) IMPLANT
DRSG TEGADERM 4X4.75 (GAUZE/BANDAGES/DRESSINGS) IMPLANT
ELECT CAUTERY BLADE 6.4 (BLADE) ×2 IMPLANT
ELECT REM PT RETURN 9FT ADLT (ELECTROSURGICAL) ×2 IMPLANT
ELECTRODE REM PT RTRN 9FT ADLT (ELECTROSURGICAL) ×2 IMPLANT
GLOVE BIO SURGEON STRL SZ 6.5 (GLOVE) ×2 IMPLANT
GLOVE BIOGEL PI IND STRL 6 (GLOVE) ×2 IMPLANT
GOWN STRL REUS W/ TWL LRG LVL3 (GOWN DISPOSABLE) ×4 IMPLANT
HANDLE SUCTION POOLE (INSTRUMENTS) ×2 IMPLANT
KIT BASIN OR (CUSTOM PROCEDURE TRAY) ×2 IMPLANT
KIT TURNOVER KIT B (KITS) ×2 IMPLANT
LIGASURE IMPACT 36 18CM CVD LR (INSTRUMENTS) IMPLANT
NS IRRIG 1000ML POUR BTL (IV SOLUTION) ×4 IMPLANT
PACK GENERAL/GYN (CUSTOM PROCEDURE TRAY) ×2 IMPLANT
PAD ARMBOARD 7.5X6 YLW CONV (MISCELLANEOUS) ×2 IMPLANT
RELOAD PROXIMATE 75MM BLUE (ENDOMECHANICALS) ×8 IMPLANT
RELOAD STAPLE 75 3.8 BLU REG (ENDOMECHANICALS) IMPLANT
SPONGE ABD ABTHERA ADVANCE (MISCELLANEOUS) IMPLANT
SPONGE T-LAP 18X18 ~~LOC~~+RFID (SPONGE) IMPLANT
STAPLER PROXIMATE 75MM BLUE (STAPLE) IMPLANT
SUCTION POOLE HANDLE (INSTRUMENTS) ×2 IMPLANT
SUT ETHILON 2 0 FS 18 (SUTURE) IMPLANT
SUT PDS AB 1 TP1 54 (SUTURE) IMPLANT
SUT PDS AB 1 TP1 96 (SUTURE) IMPLANT
SUT SILK 2 0 SH CR/8 (SUTURE) ×2 IMPLANT
SUT SILK 2 0 TIES 10X30 (SUTURE) ×2 IMPLANT
SUT SILK 3 0 SH CR/8 (SUTURE) ×2 IMPLANT
SUT SILK 3 0 TIES 10X30 (SUTURE) ×2 IMPLANT
SUT VIC AB 3-0 SH 18 (SUTURE) IMPLANT
TOWEL GREEN STERILE (TOWEL DISPOSABLE) ×2 IMPLANT
TRAY FOLEY MTR SLVR 16FR STAT (SET/KITS/TRAYS/PACK) IMPLANT
YANKAUER SUCT BULB TIP NO VENT (SUCTIONS) IMPLANT

## 2023-08-20 NOTE — Anesthesia Procedure Notes (Signed)
Procedure Name: Intubation Date/Time: 08/20/2023 7:15 PM  Performed by: Sandie Ano, CRNAPre-anesthesia Checklist: Patient identified, Emergency Drugs available, Suction available and Patient being monitored Patient Re-evaluated:Patient Re-evaluated prior to induction Oxygen Delivery Method: Circle System Utilized Preoxygenation: Pre-oxygenation with 100% oxygen Induction Type: IV induction Ventilation: Mask ventilation without difficulty Laryngoscope Size: Mac and 4 Grade View: Grade I Tube type: Oral Tube size: 7.5 mm Number of attempts: 1 Airway Equipment and Method: Stylet and Oral airway Placement Confirmation: ETT inserted through vocal cords under direct vision, positive ETCO2 and breath sounds checked- equal and bilateral Secured at: 23 cm Tube secured with: Tape Dental Injury: Teeth and Oropharynx as per pre-operative assessment

## 2023-08-20 NOTE — Op Note (Signed)
   Operative Note   Date: 08/20/2023  Procedure: exploratory laparotomy, small bowel resection, abthera wound vac placement  Pre-op diagnosis: pneumoperitoneum Post-op diagnosis: pneumoperitoneum secondary to small bowel perforation x2  Indication and clinical history: The patient is a 78 y.o. year old male with pneumoperitoneum     Surgeon: Diamantina Monks, MD  Anesthesiologist: Aleene Davidson, MD Anesthesia: General  Findings:  Specimen: smallbowel EBL: 25cc Drains/Implants: 88F JP in LLQ, traversing the left paracolic gutter  Disposition: PACU - hemodynamically stable.  Description of procedure: The patient was positioned supine on the operating room table. General anesthetic induction and intubation were uneventful. Time-out was performed verifying correct patient, procedure, signature of informed consent, and administration of pre-operative antibiotics. The abdomen was prepped and draped in the usual sterile fashion.  A midline incision was made and deepened down until the peritoneal cavity was entered.  Feculent peritonitis was immediately encountered.  The abdomen was explored in its entirety and the small bowel was eviscerated.  Two punctate holes of the distal jejunum were identified and spillage controlled.  Once the entirety of the small bowel was eviscerated, it was run from ligament of Treitz to ileocecal valve.  There were no other areas of perforation that were noted.  The two areas of perforation were close enough together to resect en bloc.  A GIA stapler was used to transect the bowel.  After the resection, continued spillage was noted from both staple lines at the mesenteric edge of the staple line.  Additional segment of small bowel was resected on either end and the staple lines carefully inspected.  Both staple lines and the adjacent bowel all appeared viable. The proximal staple line appeared intact, but the distal staple line again had feculent spillage and again at  the mesenteric edge of the staple line.  Due to evidence of prior small bowel resection x2 and in the interest of not wanting to continue resection and put the patient at risk for short gut syndrome, I made the decision to oversew the leaking portion of the staple line and leave the abdomen open for a second look operation.  Two figure-of-eight silk sutures were placed at the mesenteric edge of the staple line and the bowel confirmed to be not actively leaking.  The abdomen was irrigated with saline.  A 19 [place] JP drain was placed in the left lower quadrant quadrant, traversing the left paracolic gutter.  An ABThera wound VAC was applied as sterile dressing.  All sponge and instrument counts were correct at the conclusion of the procedure. The patient was awakened from anesthesia, extubated uneventfully, and transported to the PACU in good condition. There were no complications.   Upon entering the abdomen (organ space), I encountered feculent peritonitis.  CASE DATA:  Type of patient?: DOW CASE (Surgical Hospitalist [facility])  Status of Case? EMERGENT Add On  Infection Present At Time Of Surgery (PATOS)?  FECULENT PERITONITIS    Diamantina Monks, MD General and Trauma Surgery [facility]

## 2023-08-20 NOTE — Anesthesia Postprocedure Evaluation (Signed)
Anesthesia Post Note  Patient: [name]  Procedure(s) Performed: EXPLORATORY LAPAROTOMY, SMALL  BOWEL RESECTION APPLICATION OF ABTHERA WOUND VAC (Abdomen)     Patient location during evaluation: PACU Anesthesia Type: General Level of consciousness: awake and alert Pain management: pain level controlled Vital Signs Assessment: post-procedure vital signs reviewed and stable Respiratory status: spontaneous breathing, nonlabored ventilation and respiratory function stable Cardiovascular status: blood pressure returned to baseline and stable Postop Assessment: no apparent nausea or vomiting Anesthetic complications: no  No notable events documented.  Last Vitals:  Vitals:   08/20/23 2100 08/20/23 2115  BP: (!) 127/90 123/72  Pulse: (!) 115   Resp: 15   Temp:  (!) 38.1 C  SpO2: 94%     Last Pain:  Vitals:   08/20/23 2100  TempSrc:   PainSc: 6                  Katheleen Stella,W. EDMOND

## 2023-08-20 NOTE — Transfer of Care (Signed)
Immediate Anesthesia Transfer of Care Note  Patient: [name]  Procedure(s) Performed: EXPLORATORY LAPAROTOMY, SMALL  BOWEL RESECTION APPLICATION OF ABTHERA WOUND VAC (Abdomen)  Patient Location: PACU  Anesthesia Type:General  Level of Consciousness: awake, alert , and oriented  Airway & Oxygen Therapy: Patient Spontanous Breathing  Post-op Assessment: Report given to RN and Post -op Vital signs reviewed and stable  Post vital signs: Reviewed and stable  Last Vitals:  Vitals Value Taken Time  BP    Temp    Pulse 105 08/20/23 2030  Resp 19 08/20/23 2030  SpO2 95 % 08/20/23 2030  Vitals shown include unfiled device data.  Last Pain:  Vitals:   08/20/23 1643  TempSrc: Oral  PainSc: 6       Patients Stated Pain Goal: 4 (08/19/23 2318)  Complications: No notable events documented.

## 2023-08-20 NOTE — Progress Notes (Signed)
Physical Therapy Note  Spoke with occupational therapy after their initial evaluation. OT reports patient is functioning at a high level of independence and no physical therapy is indicated at this time. PT is signing-off. Please re-order if there is any significant decline in functional status. Thank you for this referral.  Kathlyn [place], PT, DPT [facility]  Rehabilitation Services Physical Therapist Office: [phone] Website: [email or web address]

## 2023-08-20 NOTE — Progress Notes (Signed)
GI would like a NG tube placed due to more distention of abdomen.

## 2023-08-20 NOTE — Progress Notes (Addendum)
MD made aware of critical value on CT scan. Moderate pneumoperitoneum and small amount of ascites consistent with interval bowel or gastric perforation. The specific site of the perforation is not demonstrated by this examination performed without enteric and intravenous contrast. Surgical evaluation warranted.

## 2023-08-20 NOTE — Progress Notes (Signed)
PROGRESS NOTE    [name]  FBP:[id number] DOB: March 02, 1946 DOA: 08/12/2023 PCP: Bradd Canary, MD   Brief Narrative:  [name] is a 78 y.o. male with medical history significant of HTN, HLD, hx of SBO presenting to the ED with abdominal pain. Patient reports that he has been having abdominal pain since Monday. Abdominal pain is periumbilical and does not radiate anywhere else. He had normal fully-formed BMs on Sunday, but on Monday began noticing very loose stools. Reports going about 5-6 times per day since Monday. Does endorse some associated dry heaving, but otherwise no vomiting.    Patient is admitted to the hospitalist service for evaluation of partial small bowel obstruction versus enteritis.  GI panel came back positive for norovirus.  Patient is currently able to tolerate full liquid diet, on conservative management, surgery team followed advised to advance diet, no surgical intervention. GI involved yesterday for persistent ileus advised reglan, miralax, avoid opiates and patient remains a clinical diet.  Given his continued abdominal distention and abdominal pain we repeated a CT scan as below.  Overnight his abdomen became more distended and he had more discomfort and pain on palpation.  He was made n.p.o. and started on IV fluids and an NG tube was placed.  KUB was done and showed concern for pneumoperitoneum showed General Surgery has been consulted urgently.  Assessment and Plan:  Partial Small Bowel Obstruction versus ileus Norovirus Enteritis -Continue Supportive Care. -Patient continues to have distended abdomen, is unable to tolerate soft diet, continues to be on liquid diet but given his worsening distention and pain we have made him n.p.o. -States he is no longer having loose bowel movements and is only passing flatus.  Walking with no difficulty. -Prior abd xray SBO protocol showed persistent but improved small bowel dilation. -Repeat CT scan of the abdomen pelvis  given significant pain and distention yesterday done and showed "Diffusely dilated and fluid-filled small bowel with gradual transition from dilated to nondilated in the distal ileum. No specific transition point. There is also diffuse distension of the ascending and transverse colon with air and fluid. Findings are favored to represent ileus. Scattered foci of gas throughout the small bowel appears to be non dependent and within the bowel lumen rather than pneumatosis. Small amount of free fluid in the small bowel mesentery and dependent pelvis. No abscess. No free air. Small right pleural effusion. Bilateral hypodense and hyperdense renal lesions, better characterized on prior contrast-enhanced exam." -General surgery team advised to advance diet, no indication for surgery. -GI evaluated and following recommending MiraLAX 17 g p.o. daily, metoclopramide 10 mg IV Q6 scheduled, recommending weaning narcotics but patient needed something for his IV for the scan so we will try him on IV fentanyl 12.5 to 25 mcg every 2 as needed; I tried him on [employer] [facility] -Continue to maintain potassium greater than 4 magnesium greater than 2 -Encouraged physical activity and ambulation and out of bed -Given his significant worsening abdominal distention today is made n.p.o. and started on LR at 75 mL/h and an NG tube was placed.  Stat KUB was done and showed worsening with concern for a pneumoperitoneum -KUB official read showed "Enteric tube projects over the proximal stomach. Persistent small bowel distension with concern for pneumoperitoneum. In the absence of interval surgery, findings are concerning for possible bowel perforation. Recommend general surgery consultation. Follow-up CT may be helpful for further evaluation" -Case was discussed with GI and General Surgery was urgently consulted and will see  the patient   Leukopenia, improved -Due to norovirus infection. -WBC Trend: Recent Labs  Lab 08/14/23 0716  08/15/23 0726 08/16/23 0741 08/17/23 0644 08/18/23 0626 08/19/23 0623 08/20/23 0618  WBC 3.7* 5.0 6.6 8.4 8.8 6.8 9.0  -Continue to Monitor and Trend and Repeat CBC in the AM   Essential Hypertension -Blood pressure stable. -Continue Metoprolol Succinate 50 mg po Daily -Continue to Monitor BP per Protocol -Last BP reading was 127/81  Hyperlipidemia -C/w Rosuvastatin 20 mg po Daily   Hyponatremia -In the setting of Hypovolemia; Na+ Trend: Recent Labs  Lab 08/14/23 0716 08/15/23 0726 08/16/23 0741 08/17/23 0644 08/18/23 0626 08/19/23 0623 08/20/23 0618  NA 131* 128* 128* 129* 129* 131* 131*  -Able to tolerate po Liquids and Salt tabs ordered and getting them 1 gram po BID -Continue to Monitor and Trend and repeat CMP in the AM   Hypokalemia, improved -Patient's K+ Level dropped to 3.2 on 2/16 and now improved to 4.1 today -Continue to Monitor and Replete as Necessary -Repeat CMP in the AM   Hypomagnesemia, improved -Patient's Mag Level Trend: Recent Labs  Lab 08/13/23 0828 08/14/23 0716 08/16/23 0741 08/19/23 0623 08/20/23 0618  MG 1.6* 1.9 2.1 1.9 2.0  -Continue to Monitor and Replete as Necessary -Repeat Mag in the AM   Normocytic Anemia -Hgb/Hct Trend: Recent Labs  Lab 08/15/23 0726 08/16/23 0741 08/17/23 0644 08/17/23 1712 08/18/23 0626 08/19/23 0623 08/20/23 0618  HGB 11.6* 11.5* 11.7* 12.0* 11.3* 11.1* 11.8*  HCT 33.2* 32.8* 33.7* 34.5* 33.1* 31.9* 33.8*  MCV 81.2 81.2 81.4  --  82.3 82.9 81.6  -Check Anemia Panel in the AM -Continue to Monitor for S/Sx of Bleeding; No overt bleeding noted -Repeat CBC in the AM   Hyperbilirubinemia, mild and worsened  -Patient's Bilirubin Trend: Recent Labs  Lab 08/11/23 1046 08/12/23 0927 08/19/23 0623 08/20/23 0618  BILITOT 0.9 1.2 1.1 1.3*  -Likely Reactive; Continue to Monitor and Trend and repeat CMP in the AM  Hypoalbuminemia -Patient's Albumin Trend: Recent Labs  Lab 08/11/23 1046  08/12/23 0927 08/19/23 0623 08/20/23 0618  ALBUMIN 4.3 3.5 2.6* 2.7*  -Continue to Monitor and Trend and repeat CMP in the AM  Class I Obesity -Complicates overall prognosis and care -Estimated body mass index is 30.71 kg/m as calculated from the following:   Height as of 08/11/23: 5\' 10"  (1.778 m).   Weight as of 08/11/23: 97.1 kg.  -Weight Loss and Dietary Counseling given   DVT prophylaxis: With Lovenox again given NG tube insertion     Code Status: Full Code Family Communication: No family currently at bedside  Disposition Plan:  Level of care: Med-Surg Status is: Inpatient Remains inpatient appropriate because: Needs further clinical improvement and clearance by the specialists   Consultants:  General Surgery Gastroenterology  Procedures:  As delineated above  Antimicrobials:  Anti-infectives (From admission, onward)    None       Subjective: Seen and examined at bedside his abdomen was much more distended compared to yesterday and is much more painful and tender.  States that he has not had any more bowel movements now but is passing some gas.  Denies any lightheadedness or dizziness.  Overall appears uncomfortable.  No other concerns or complaints at this time.  Objective: Vitals:   08/19/23 2038 08/20/23 0441 08/20/23 0930 08/20/23 1053  BP: 123/80 128/84 118/77 127/81  Pulse: 95 93 (!) 106 (!) 107  Resp: 18 18    Temp: 99.6 F (37.6 C) 99.6  F (37.6 C) 99.5 F (37.5 C)   TempSrc: Oral Oral    SpO2: 99% 97% 98%    No intake or output data in the 24 hours ending 08/20/23 1343 There were no vitals filed for this visit.  Examination: Physical Exam:  Constitutional: Elderly African-American male in no acute distress Respiratory: Clear to auscultation bilaterally, no wheezing, rales, rhonchi or crackles. Normal respiratory effort and patient is not tachypenic. No accessory muscle use.  Cardiovascular: RRR, no murmurs / rubs / gallops. S1 and S2  auscultated. No extremity edema. 2+ pedal pulses. No carotid bruits.  Abdomen: Soft, significantly tender to palpate and significantly distended with very tympanic sounds. Bowel sounds are diminished GU: Deferred. Musculoskeletal: No clubbing / cyanosis of digits/nails. No joint deformity upper and lower extremities. Skin: No rashes, lesions, ulcers. No induration; Warm and dry.  Neurologic: CN 2-12 grossly intact with no focal deficits.  Romberg sign and cerebellar reflexes not assessed.  Psychiatric: Normal judgment and insight.  Anxious but is awake and alert  Data Reviewed: I have personally reviewed following labs and imaging studies  CBC: Recent Labs  Lab 08/16/23 0741 08/17/23 0644 08/17/23 1712 08/18/23 0626 08/19/23 0623 08/20/23 0618  WBC 6.6 8.4  --  8.8 6.8 9.0  NEUTROABS  --   --   --   --  4.4 6.2  HGB 11.5* 11.7* 12.0* 11.3* 11.1* 11.8*  HCT 32.8* 33.7* 34.5* 33.1* 31.9* 33.8*  MCV 81.2 81.4  --  82.3 82.9 81.6  PLT 232 262  --  289 307 359   Basic Metabolic Panel: Recent Labs  Lab 08/14/23 0716 08/15/23 0726 08/16/23 0741 08/17/23 0644 08/18/23 0626 08/19/23 0623 08/20/23 0618  NA 131*   < > 128* 129* 129* 131* 131*  K 4.1   < > 3.2* 3.5 3.9 3.7 4.1  CL 98   < > 89* 93* 91* 96* 94*  CO2 22   < > 27 25 28 26 26   GLUCOSE 97   < > 116* 107* 118* 106* 110*  BUN 13   < > 11 12 11 8 11   CREATININE 1.02   < > 1.01 0.99 0.99 1.16 1.05  CALCIUM 8.5*   < > 8.5* 8.4* 8.3* 8.6* 8.7*  MG 1.9  --  2.1  --   --  1.9 2.0  PHOS 2.1*  --  2.7  --   --  3.4 3.5   < > = values in this interval not displayed.   GFR: Estimated Creatinine Clearance: 71 mL/min (by C-G formula based on SCr of 1.05 mg/dL). Liver Function Tests: Recent Labs  Lab 08/19/23 0623 08/20/23 0618  AST 29 22  ALT 44 40  ALKPHOS 82 83  BILITOT 1.1 1.3*  PROT 6.0* 6.3*  ALBUMIN 2.6* 2.7*   No results for input(s): "LIPASE", "AMYLASE" in the last 168 hours. No results for input(s): "AMMONIA"  in the last 168 hours. Coagulation Profile: No results for input(s): "INR", "PROTIME" in the last 168 hours. Cardiac Enzymes: No results for input(s): "CKTOTAL", "CKMB", "CKMBINDEX", "TROPONINI" in the last 168 hours. BNP (last 3 results) No results for input(s): "PROBNP" in the last 8760 hours. HbA1C: No results for input(s): "HGBA1C" in the last 72 hours. CBG: No results for input(s): "GLUCAP" in the last 168 hours. Lipid Profile: No results for input(s): "CHOL", "HDL", "LDLCALC", "TRIG", "CHOLHDL", "LDLDIRECT" in the last 72 hours. Thyroid Function Tests: No results for input(s): "TSH", "T4TOTAL", "FREET4", "T3FREE", "THYROIDAB" in the last  72 hours. Anemia Panel: No results for input(s): "VITAMINB12", "FOLATE", "FERRITIN", "TIBC", "IRON", "RETICCTPCT" in the last 72 hours. Sepsis Labs: No results for input(s): "PROCALCITON", "LATICACIDVEN" in the last 168 hours.  Recent Results (from the past 240 hours)  Gastrointestinal Panel by PCR , Stool     Status: Abnormal   Collection Time: 08/12/23 10:35 PM   Specimen: Stool  Result Value Ref Range Status   Campylobacter species NOT DETECTED NOT DETECTED Final   Plesimonas shigelloides NOT DETECTED NOT DETECTED Final   Salmonella species NOT DETECTED NOT DETECTED Final   Yersinia enterocolitica NOT DETECTED NOT DETECTED Final   Vibrio species NOT DETECTED NOT DETECTED Final   Vibrio cholerae NOT DETECTED NOT DETECTED Final   Enteroaggregative E coli (EAEC) NOT DETECTED NOT DETECTED Final   Enteropathogenic E coli (EPEC) NOT DETECTED NOT DETECTED Final   Enterotoxigenic E coli (ETEC) NOT DETECTED NOT DETECTED Final   Shiga like toxin producing E coli (STEC) NOT DETECTED NOT DETECTED Final   Shigella/Enteroinvasive E coli (EIEC) NOT DETECTED NOT DETECTED Final   Cryptosporidium NOT DETECTED NOT DETECTED Final   Cyclospora cayetanensis NOT DETECTED NOT DETECTED Final   Entamoeba histolytica NOT DETECTED NOT DETECTED Final   Giardia  lamblia NOT DETECTED NOT DETECTED Final   Adenovirus F40/41 NOT DETECTED NOT DETECTED Final   Astrovirus NOT DETECTED NOT DETECTED Final   Norovirus GI/GII DETECTED (A) NOT DETECTED Final    Comment: RESULT CALLED TO, READ BACK BY AND VERIFIED WITH: LEFT VOICEMAIL 0925 08/13/2023 [facility] WHITE AT 1610 08/14/23.PMF    Rotavirus A NOT DETECTED NOT DETECTED Final   Sapovirus (I, II, IV, and V) NOT DETECTED NOT DETECTED Final    Comment: Performed at [facility], [address], [place], [place] [address]    Radiology Studies: DG Abd Portable 1V Result Date: 08/20/2023 CLINICAL DATA:  Abdominal distension.  Nasogastric tube placement. EXAM: PORTABLE ABDOMEN - 1 VIEW COMPARISON:  Abdominopelvic CT and radiographs 08/19/2023. FINDINGS: 1219 hours. Single portable supine view of the abdomen demonstrates the tip of an enteric tube overlying the proximal stomach. The stomach remains mildly distended. There are persistent dilated loops of small bowel. There is suspicion of new extraluminal air in the right upper quadrant and left mid abdomen. In the absence of interval abdominal surgery, findings are concerning for possible bowel perforation. IMPRESSION: 1. Enteric tube projects over the proximal stomach. 2. Persistent small bowel distension with concern for pneumoperitoneum. In the absence of interval surgery, findings are concerning for possible bowel perforation. Recommend general surgery consultation. Follow-up CT may be helpful for further evaluation. 3. These results will be called to the ordering clinician or representative by the Radiologist Assistant, and communication documented in the PACS or [employer]. Electronically Signed   By: Carey Bullocks M.D.   On: 08/20/2023 12:51   CT ABDOMEN PELVIS WO CONTRAST Result Date: 08/19/2023 CLINICAL DATA:  Abdominal pain, post-op Bowel obstruction suspected EXAM: CT ABDOMEN AND PELVIS WITHOUT CONTRAST TECHNIQUE: Multidetector CT imaging of  the abdomen and pelvis was performed following the standard protocol without IV contrast. RADIATION DOSE REDUCTION: This exam was performed according to the departmental dose-optimization program which includes automated exposure control, adjustment of the mA and/or kV according to patient size and/or use of iterative reconstruction technique. COMPARISON:  Preoperative CT 08/11/2023 FINDINGS: Lower chest: Small right pleural effusion. Hepatobiliary: Unremarkable unenhanced appearance. Gallbladder physiologically distended, no calcified stone. No biliary dilatation. Pancreas: Unremarkable unenhanced appearance. Spleen: Normal in size without focal  abnormality. Adrenals/Urinary Tract: No adrenal nodule. No hydronephrosis. Bilateral hypodense and hyperdense renal lesions, better characterized on prior contrast-enhanced exam. Unremarkable urinary bladder. Stomach/Bowel: Moderate gaseous gastric distension. Small bowel is diffusely dilated and fluid-filled. There is gradual transition from dilated to nondilated in the distal ileum. No specific transition point. Scattered foci of small bowel gas all appears to be non dependent and likely represents air within the bowel rather than pneumatosis. Moderate edema within the small bowel mesentery. There is also diffuse distension of the ascending and transverse colon with air and fluid. The left colon is less dilated. No wall thickening or abnormality in the area of transition. Left colonic diverticulosis without diverticulitis. Normal appendix. Vascular/Lymphatic: Aortic atherosclerosis. No aneurysm. There is no portal venous or mesenteric gas. No bulky adenopathy. Reproductive: Enlarged prostate again seen. Other: Small amount of free fluid in the small bowel mesentery and dependent pelvis. No abscess. No free air. No abdominal wall collection. Musculoskeletal: Multilevel degenerative change throughout the spine. Moderate bilateral hip osteoarthritis. There are no acute or  suspicious osseous abnormalities. IMPRESSION: 1. Diffusely dilated and fluid-filled small bowel with gradual transition from dilated to nondilated in the distal ileum. No specific transition point. There is also diffuse distension of the ascending and transverse colon with air and fluid. Findings are favored to represent ileus. 2. Scattered foci of gas throughout the small bowel appears to be non dependent and within the bowel lumen rather than pneumatosis. 3. Small amount of free fluid in the small bowel mesentery and dependent pelvis. No abscess. No free air. 4. Small right pleural effusion. 5. Bilateral hypodense and hyperdense renal lesions, better characterized on prior contrast-enhanced exam. Electronically Signed   By: Narda Rutherford M.D.   On: 08/19/2023 16:22   DG Abd Portable 1V Result Date: 08/19/2023 CLINICAL DATA:  Ileus.  Abdominal pain. EXAM: PORTABLE ABDOMEN - 1 VIEW COMPARISON:  August 18, 2023. FINDINGS: Stable small bowel dilatation.  No definite colonic dilatation. IMPRESSION: Stable small bowel dilatation concerning for possible obstruction or ileus. Electronically Signed   By: Lupita Raider M.D.   On: 08/19/2023 14:10   DG Abd 1 View Result Date: 08/18/2023 CLINICAL DATA:  Follow-up ileus EXAM: ABDOMEN - 1 VIEW COMPARISON:  08/16/2023 FINDINGS: Persistent small bowel dilatation is noted. It may be slightly greater than that seen on the prior exam. No free air is noted. No bony abnormality is seen. IMPRESSION: Slight increase in the degree of small-bowel dilatation. Electronically Signed   By: Alcide Clever M.D.   On: 08/18/2023 18:57   Scheduled Meds:  feeding supplement  1 Container Oral TID BM   metoCLOPramide (REGLAN) injection  10 mg Intravenous Q6H   metoprolol succinate  50 mg Oral Daily   pantoprazole (PROTONIX) IV  40 mg Intravenous Q24H   [START ON 08/22/2023] polyethylene glycol  17 g Oral Daily   rosuvastatin  20 mg Oral Daily   sodium chloride  1 g Oral BID WC    Continuous Infusions:  lactated ringers 75 mL/hr at 08/20/23 1200    LOS: 7 days   Marguerita Merles, DO [facility] Available via Epic secure chat 7am-7pm After these hours, please refer to coverage provider listed on [email or web address] 08/20/2023, 1:43 PM

## 2023-08-20 NOTE — Progress Notes (Signed)
Patient ID: [name], male   DOB: 1945/08/23, 78 y.o.   MRN: [id number]   Surgeries been asked see this patient again after an abdominal x-ray for nasogastric tube placement suggested the possibility of free air.  He has been in the hospital with an ileus from a norovirus.  We saw him recently to rule out a small bowel obstruction and he passed a small bowel protocol.  He has continued to remain distended.  He had a CT scan late yesterday that showed diffusely dilated and fluid-filled small bowel as well as a distended stomach and distention of the ascending and transverse colon favoring an ileus.  There was no evidence of free air on that scan  The patient himself reports that he is still remain distended but has had no change in his abdominal pain over the last several days.  On exam, he appears comfortable.  His abdomen is distended with mild diffuse tenderness which he reports is unchanged since his admission.  He does not have frank peritonitis  Assessment/plan: Ileus from norovirus, possible pneumoperitoneum  Clinically, he does not seem to have peritonitis but his exam is slightly difficult secondary to his distention.  I have discussed this with him.  We have decided to get a stat CT scan of the abdomen pelvis without contrast to see if truly if there is free air.  I explained to him if there was that this was suggest there was bowel perforation and he would require an exploratory laparotomy.  We placed his nasogastric tube to suction. The CT scan has been ordered STAT and we will follow up on the results  Complex medical decision making

## 2023-08-20 NOTE — Progress Notes (Signed)
Pharmacy Antibiotic Note  [name] is a 78 y.o. male admitted on 08/12/2023 with  abd infection .  Pharmacy has been consulted for zosyn dosing.  Pt has been here for ileus associated norovirus. Zosyn has been ordered for as suspected perforation.  Scr ~1, k wnl  Plan: Zosyn 3.375g IV q8 F/u LOT     Temp (24hrs), Avg:99.4 F (37.4 C), Min:98.7 F (37.1 C), Max:99.6 F (37.6 C)  Recent Labs  Lab 08/16/23 0741 08/17/23 0644 08/18/23 0626 08/19/23 0623 08/20/23 0618  WBC 6.6 8.4 8.8 6.8 9.0  CREATININE 1.01 0.99 0.99 1.16 1.05    Estimated Creatinine Clearance: 71 mL/min (by C-G formula based on SCr of 1.05 mg/dL).    No Known Allergies  Antimicrobials this admission: 2/20 zosyn>>  Dose adjustments this admission:   Microbiology results: 2/12 GI panel>>norovirus  Ulyses Southward, PharmD, BCIDP, AAHIVP, CPP Infectious Disease Pharmacist 08/20/2023 3:29 PM

## 2023-08-20 NOTE — Progress Notes (Signed)
[facility] Progress Note  CC:  Abdominal distention, ileus   Subjective: He endorses having nausea, dry heaves and vomited a small amount of nonbloody bilious emesis earlier this morning.  His generalized abdominal pain has progressively worsened.  His abdominal distention has increased overnight as well.  He passed a very small amount of gas per the rectum this morning.  No BM or blood per the rectum.  No chest pain or shortness of breath.   Objective:  Vital signs in last 24 hours: Temp:  [98.7 F (37.1 C)-99.6 F (37.6 C)] 99.6 F (37.6 C) (02/20 0441) Pulse Rate:  [93-107] 93 (02/20 0441) Resp:  [17-18] 18 (02/20 0441) BP: (109-128)/(80-86) 128/84 (02/20 0441) SpO2:  [97 %-100 %] 97 % (02/20 0441) Last BM Date : 08/18/23 General: 78 year old male alert, moaning out loud when trying to sit up in the bed. Heart: Regular rate and rhythm.  No murmur. Pulm: Breath sounds clear throughout. Abdomen: Abdomen is grossly distended, more firm when compared to yesterday afternoon's exam.  Generalized tenderness without rebound or guarding.  Patient moans out loud when transitioning from a supine to sitting position.  Tinkering bowel sounds to all 4 quadrants. Extremities:  Without edema. Neurologic:  Alert and  oriented x 4. Grossly normal neurologically. Psych:  Alert and cooperative. Normal mood and affect.  Intake/Output from previous day: No intake/output data recorded. Intake/Output this shift: No intake/output data recorded.  Lab Results: Recent Labs    08/18/23 0626 08/19/23 0623 08/20/23 0618  WBC 8.8 6.8 9.0  HGB 11.3* 11.1* 11.8*  HCT 33.1* 31.9* 33.8*  PLT 289 307 359   BMET Recent Labs    08/18/23 0626 08/19/23 0623 08/20/23 0618  NA 129* 131* 131*  K 3.9 3.7 4.1  CL 91* 96* 94*  CO2 28 26 26   GLUCOSE 118* 106* 110*  BUN 11 8 11   CREATININE 0.99 1.16 1.05  CALCIUM 8.3* 8.6* 8.7*   LFT Recent Labs    08/19/23 0623 08/20/23 0618   PROT 6.0* 6.3*  ALBUMIN 2.6* 2.7*  AST 29 22  ALT 44 40  ALKPHOS 82 83  BILITOT 1.1 1.3*  BILIDIR 0.2  --   IBILI 0.9  --    PT/INR No results for input(s): "LABPROT", "INR" in the last 72 hours. Hepatitis Panel No results for input(s): "HEPBSAG", "HCVAB", "HEPAIGM", "HEPBIGM" in the last 72 hours.  CT ABDOMEN PELVIS WO CONTRAST Result Date: 08/19/2023 CLINICAL DATA:  Abdominal pain, post-op Bowel obstruction suspected EXAM: CT ABDOMEN AND PELVIS WITHOUT CONTRAST TECHNIQUE: Multidetector CT imaging of the abdomen and pelvis was performed following the standard protocol without IV contrast. RADIATION DOSE REDUCTION: This exam was performed according to the departmental dose-optimization program which includes automated exposure control, adjustment of the mA and/or kV according to patient size and/or use of iterative reconstruction technique. COMPARISON:  Preoperative CT 08/11/2023 FINDINGS: Lower chest: Small right pleural effusion. Hepatobiliary: Unremarkable unenhanced appearance. Gallbladder physiologically distended, no calcified stone. No biliary dilatation. Pancreas: Unremarkable unenhanced appearance. Spleen: Normal in size without focal abnormality. Adrenals/Urinary Tract: No adrenal nodule. No hydronephrosis. Bilateral hypodense and hyperdense renal lesions, better characterized on prior contrast-enhanced exam. Unremarkable urinary bladder. Stomach/Bowel: Moderate gaseous gastric distension. Small bowel is diffusely dilated and fluid-filled. There is gradual transition from dilated to nondilated in the distal ileum. No specific transition point. Scattered foci of small bowel gas all appears to be non dependent and likely represents air within the bowel rather than pneumatosis. Moderate edema  within the small bowel mesentery. There is also diffuse distension of the ascending and transverse colon with air and fluid. The left colon is less dilated. No wall thickening or abnormality in the area  of transition. Left colonic diverticulosis without diverticulitis. Normal appendix. Vascular/Lymphatic: Aortic atherosclerosis. No aneurysm. There is no portal venous or mesenteric gas. No bulky adenopathy. Reproductive: Enlarged prostate again seen. Other: Small amount of free fluid in the small bowel mesentery and dependent pelvis. No abscess. No free air. No abdominal wall collection. Musculoskeletal: Multilevel degenerative change throughout the spine. Moderate bilateral hip osteoarthritis. There are no acute or suspicious osseous abnormalities. IMPRESSION: 1. Diffusely dilated and fluid-filled small bowel with gradual transition from dilated to nondilated in the distal ileum. No specific transition point. There is also diffuse distension of the ascending and transverse colon with air and fluid. Findings are favored to represent ileus. 2. Scattered foci of gas throughout the small bowel appears to be non dependent and within the bowel lumen rather than pneumatosis. 3. Small amount of free fluid in the small bowel mesentery and dependent pelvis. No abscess. No free air. 4. Small right pleural effusion. 5. Bilateral hypodense and hyperdense renal lesions, better characterized on prior contrast-enhanced exam. Electronically Signed   By: Narda Rutherford M.D.   On: 08/19/2023 16:22   DG Abd Portable 1V Result Date: 08/19/2023 CLINICAL DATA:  Ileus.  Abdominal pain. EXAM: PORTABLE ABDOMEN - 1 VIEW COMPARISON:  August 18, 2023. FINDINGS: Stable small bowel dilatation.  No definite colonic dilatation. IMPRESSION: Stable small bowel dilatation concerning for possible obstruction or ileus. Electronically Signed   By: Lupita Raider M.D.   On: 08/19/2023 14:10   DG Abd 1 View Result Date: 08/18/2023 CLINICAL DATA:  Follow-up ileus EXAM: ABDOMEN - 1 VIEW COMPARISON:  08/16/2023 FINDINGS: Persistent small bowel dilatation is noted. It may be slightly greater than that seen on the prior exam. No free air is noted.  No bony abnormality is seen. IMPRESSION: Slight increase in the degree of small-bowel dilatation. Electronically Signed   By: Alcide Clever M.D.   On: 08/18/2023 18:57    Assessment / Plan:  78 year old male with history of ventral hernia repair presented with nausea, vomiting, abdominal pain, and diarrhea. GI pathogen panel was positive for norovirus. We were consulted for persistent ileus versus partial small bowel obstruction. CT upon arrival showed dilation of the mid to distal jejunum that is concerning for ileus versus partial small bowel obstruction.  Small bowel protocol with oral contrast showed contrast that traversed to the colon.  Seen by general surgery who recommended conservative management and surgical intervention was not required.  Persistent abdominal pain and distention. Abdominal x-ray today showed stable small bowel dilatation concerning for possible obstruction or ileus. Repeat CTAP without contrast showed diffusely dilated and fluid-filled small bowel with gradual transition from dilated to nondilated in the distal ileum without a specific transition point with diffuse distention of the ascending and transverse colon with air and fluid consistent with an ileus and scattered gas throughout the small bowel without free air or abscess.  K+ 4.1. Magnesium 1.9.  Received Relistor 12mg  injection 2/19.  Abdominal distention and generalized abdominal pain has progressively worsened overnight into this morning.  He endorses having nausea, dry heaves and vomited up a small amount of nonbloody bilious emesis earlier this morning. -NPO  -Stat NG tube placement, RN notified  -Stat abdominal xray after NGT placed -Continue Reglan 10mg  IV Q 6 hrs -Pain management and IVFs  per the hospitalist  -Maintain K > 4 and Mg > 2 -Continue to encourage physical activity in order to stimulate bowel activity    Normocytic anemia.  Hemoglobin 12 -> 11.1 -> 11.1 -> today Hg 11.8.  Patient endorses passing  bright red blood with loose bowel movements on 2/17 and 2/18. Last colonoscopy 07/2021 with 2 small polyps removed and also noted multiple diverticuli. Path report showed 1 tubular adenoma and 1 nonpolyp both diminutive, no recall due to age.  -Monitor patient for GI bleeding -No plans for endoscopic evaluation at this time   Hyponatremia. NA+ 131. -Management per the hospitalist  T. Bili 1.3. Normal Alk phos, AST/ALT levels -Add direct bili to am lab draw     Principal Problem:   Ileus (HCC) Active Problems:   SBO (small bowel obstruction) (HCC)     LOS: 7 days   [name]  08/20/2023, 9:06AM

## 2023-08-20 NOTE — Progress Notes (Signed)
Patient ID: [name], male   DOB: 1946-06-03, 78 y.o.   MRN: [id number]   Unfortunately the CT scan of the abdomen pelvis shows free air consistent with bowel perforation.  The exact site cannot be identified.  I explained this to the patient and his wife by phone.  An exploratory laparotomy is strongly recommended.  I explained that without surgery he would likely develop sepsis and this condition would worsen.  I explained proceeding with an exploratory laparotomy with possible bowel resection and possible ostomy.  I discussed the risks which includes but is not limited to bleeding, infection, the need for bowel resection, the need for a possible ostomy, the need for further procedures, cardiopulmonary issues, DVT, the potential need to stay on the ventilator postoperatively, etc.  They understand and agree to proceed with surgery.  Given the time of the day, this may being performed by my partner Dr. Bedelia Person who is on-call.  I explained that to the patient and his wife as well.

## 2023-08-20 NOTE — Plan of Care (Signed)
  Problem: Clinical Measurements: Goal: Ability to maintain clinical measurements within normal limits will improve Outcome: Progressing   Problem: Activity: Goal: Risk for activity intolerance will decrease Outcome: Progressing   Problem: Nutrition: Goal: Adequate nutrition will be maintained Outcome: Progressing   Problem: Pain Managment: Goal: General experience of comfort will improve and/or be controlled Outcome: Progressing

## 2023-08-20 NOTE — Consult Note (Signed)
NAME:  [name], MRN:  [id number], DOB:  1946/01/08, LOS: 7 ADMISSION DATE:  08/12/2023, CONSULTATION DATE:  08/20/23 REFERRING MD:  Dr Bedelia Person, CHIEF COMPLAINT:  icu admission 2/2 open abdomen   History of Present Illness:  78 yo male admitted to hospital ~8 days ago after being seen by pcp on 2/12 due to diffuse abdominal pain. They advised him to seek care at ED after no outpt radiology appts for ct were available. At that time pt endorsed N/D but no f/c or emesis. Abdominal pain was reported to be periumbilical without radiation. Pt was having normal BM till Sunday 2/9, after then reports 5-6 bm per day with associated "dry heaves" but no frank emesis. Denied at that time any recent illnesses or sick contacts. At presentation he was admitted to [facility] with surgery consult and after evaluation felt 2/2 to have ileus and plan for conservative management.   During hospitalization pt was found to be norovirus positive and was continued with supportive care. He was able to tolerate oral diet as af 2/14. Pt's abdominal exam progressed per chart review req surgery to re-engage 2/15. Pt has been unable to tolerate soft diet only liquid since decline from 2/14. Gi was consulted 2/17 to provide recommendations, they had multiple recommendations, involving decreased narcotic use, increase movement and gi motility medications in order to stimulate bowel activity. 2/19 pt's abdominal pain increased, he was unable to tolerate clear liquids, he was made NPO and ct abdomen scan obtained which revealed diffusely dilated and fluid filled sb with distention of the colon as well. He was given a dose of relistor in an attempt to help however 2/20 pt had continued n/v and distention without bm and imaging revealed continued/if not worsening distension, concern for perforation with ? Pneumoperitoneum and he was taken to OR after initiation of abx.   Taken to OR today for exlap, small bowel resection required after finding of  pneumoperitoneum 2/2 sb perforation.   Pt was able to be extubated post operatively and at this time hemodynamically stable however surgery has requested monitoring in ICU for which ICU has been consulted.     Pertinent  Medical History  Htn Hyperlipidemia H/o sbo  Significant Hospital Events: Including procedures, antibiotic start and stop dates in addition to other pertinent events   Admitted 2/12 Surgery consulted 2./12 due to ? Sbo Gi consulted 2/17 due to persistent gi dysmotility OR with surgery 2/20, left open for further eval and admitted to ICU  Interim History / Subjective:    Objective   Blood pressure 125/80, pulse (!) 113, temperature (!) 101.2 F (38.4 C), resp. rate 18, SpO2 96%.        Intake/Output Summary (Last 24 hours) at 08/20/2023 2102 Last data filed at 08/20/2023 2012 Gross per 24 hour  Intake 1150 ml  Output 200 ml  Net 950 ml   There were no vitals filed for this visit.  Examination: General: acutely ill appearing. Post operatively but alert and awake and conversant HENT: ncat eomi, perrla, mm dry but pink. Numerous baseline nevi on face Lungs: ctab Cardiovascular: tachy but regular Abdomen: distended, open with vac in place as well as drains Extremities: no c/c/e Neuro: no focal deficits appreciated GU: deferred  Resolved Hospital Problem list     Assessment & Plan:  Acute abdominal pain Sbo ileus Sb perforation with pneumoperitoneum Hyponatremia Malnutrition, albumin 2.7 Chronic normocytic anemia Sepsis 2/2 above -per surgery -cont broad abx for intra abdominal infection at this time  including gn and anaerobic coverage -cont with volume resuscitation -monitor BP and initiate pressors if MAP <65 -agree with NPO -abdomen remains open so will defer to surgery for future wash outs and closure -ensure pain control   Best Practice (right click and "Reselect all SmartList Selections" daily)   Diet/type: NPO DVT prophylaxis  SCD Pressure ulcer(s): N/A GI prophylaxis: PPI Lines: N/A Foley:  Yes, and it is still needed Code Status:  full code Last date of multidisciplinary goals of care discussion [per primary]  Labs   CBC: Recent Labs  Lab 08/16/23 0741 08/17/23 0644 08/17/23 1712 08/18/23 0626 08/19/23 0623 08/20/23 0618  WBC 6.6 8.4  --  8.8 6.8 9.0  NEUTROABS  --   --   --   --  4.4 6.2  HGB 11.5* 11.7* 12.0* 11.3* 11.1* 11.8*  HCT 32.8* 33.7* 34.5* 33.1* 31.9* 33.8*  MCV 81.2 81.4  --  82.3 82.9 81.6  PLT 232 262  --  289 307 359    Basic Metabolic Panel: Recent Labs  Lab 08/14/23 0716 08/15/23 0726 08/16/23 0741 08/17/23 0644 08/18/23 0626 08/19/23 0623 08/20/23 0618  NA 131*   < > 128* 129* 129* 131* 131*  K 4.1   < > 3.2* 3.5 3.9 3.7 4.1  CL 98   < > 89* 93* 91* 96* 94*  CO2 22   < > 27 25 28 26 26   GLUCOSE 97   < > 116* 107* 118* 106* 110*  BUN 13   < > 11 12 11 8 11   CREATININE 1.02   < > 1.01 0.99 0.99 1.16 1.05  CALCIUM 8.5*   < > 8.5* 8.4* 8.3* 8.6* 8.7*  MG 1.9  --  2.1  --   --  1.9 2.0  PHOS 2.1*  --  2.7  --   --  3.4 3.5   < > = values in this interval not displayed.   GFR: Estimated Creatinine Clearance: 71 mL/min (by C-G formula based on SCr of 1.05 mg/dL). Recent Labs  Lab 08/17/23 0644 08/18/23 0626 08/19/23 0623 08/20/23 0618  WBC 8.4 8.8 6.8 9.0    Liver Function Tests: Recent Labs  Lab 08/19/23 0623 08/20/23 0618  AST 29 22  ALT 44 40  ALKPHOS 82 83  BILITOT 1.1 1.3*  PROT 6.0* 6.3*  ALBUMIN 2.6* 2.7*   No results for input(s): "LIPASE", "AMYLASE" in the last 168 hours. No results for input(s): "AMMONIA" in the last 168 hours.  ABG No results found for: "PHART", "PCO2ART", "PO2ART", "HCO3", "TCO2", "ACIDBASEDEF", "O2SAT"   Coagulation Profile: No results for input(s): "INR", "PROTIME" in the last 168 hours.  Cardiac Enzymes: No results for input(s): "CKTOTAL", "CKMB", "CKMBINDEX", "TROPONINI" in the last 168 hours.  HbA1C: Hgb A1c  MFr Bld  Date/Time Value Ref Range Status  05/14/2023 02:12 PM 6.0 4.6 - 6.5 % Final    Comment:    Glycemic Control Guidelines for People with Diabetes:Non Diabetic:  <6%Goal of Therapy: <7%Additional Action Suggested:  >8%   11/11/2022 12:20 PM 6.2 4.6 - 6.5 % Final    Comment:    Glycemic Control Guidelines for People with Diabetes:Non Diabetic:  <6%Goal of Therapy: <7%Additional Action Suggested:  >8%     CBG: No results for input(s): "GLUCAP" in the last 168 hours.  Review of Systems:   As per hpi  Past Medical History:  He,  has a past medical history of Abnormal CT scan, gastrointestinal tract suspect angioedema, Anemia (03/16/2016),  Anxiety, Arthritis, Back pain (10/10/2012), Fatty liver (2005), Hemorrhoids (08/22/2016), Hemorrhoids, internal, with bleeding (2005), Hyperglycemia (08/21/2013), Hyperlipidemia, Hypertension, Nocturia (08/21/2013), Obesity (BMI 30.0-34.9), Osteoporosis, Pneumonia, Pre-diabetes, Rectal bleeding (07/30/2014), Renal cyst, SBO (small bowel obstruction) (HCC), Tubular adenoma of colon (before 2005, 2012, 08/2014), Unspecified constipation (02/06/2014), and Vitamin D deficiency.   Surgical History:   Past Surgical History:  Procedure Laterality Date   BICEPT TENODESIS Right 03/12/2023   Procedure: BICEPS TENODESIS;  Surgeon: Cammy Copa, MD;  Location: [facility] OR;  Service: Orthopedics;  Laterality: Right;   CATARACT EXTRACTION W/ INTRAOCULAR LENS IMPLANT Bilateral    COLONOSCOPY  before 2005, 2012, 2013, 08/2014.    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   Procedure: REVERSE SHOULDER ARTHROPLASTY;   Surgeon: Cammy Copa, MD;  Location: [facility] OR;  Service: Orthopedics;  Laterality: Right;   TONSILLECTOMY  1957   TOTAL KNEE ARTHROPLASTY Right 07/24/2022   Procedure: RIGHT TOTAL KNEE ARTHROPLASTY;  Surgeon: Cammy Copa, MD;  Location[facility];  Service: Orthopedics;  Laterality: Right;   TOTAL SHOULDER ARTHROPLASTY Left 11/01/2020   Procedure: LEFT ANATOMIC SHOULDER REPLACEMENT;  Surgeon: Cammy Copa, MD;  Location: [facility] OR;  Service: Orthopedics;  Laterality: Left;   VENTRAL HERNIA REPAIR N/A 06/03/2018   Procedure: LAPAROSCOPIC VENTRAL WALL  HERNIA  REPAIR WITH MESH  ERAS PATHWAY;  Surgeon: Karie Soda, MD;  Location: [facility] ORS;  Service: General;  Laterality: N/A;     Social History:   reports that he has never smoked. He has never used smokeless tobacco. He reports that he does not currently use alcohol. He reports that he does not use drugs.   Family History:  His family history includes Aneurysm in his brother and mother; Arthritis in his father; COPD in his brother; Diabetes in his brother, sister, and sister; Gallbladder disease in his father and sister; Hyperlipidemia in his sister and sister; Hypertension in his sister and sister; Liver cancer in his father; Obesity in his sister and sister; Stroke in his mother. There is no history of Colon cancer, Colon polyps, Esophageal cancer, Rectal cancer, Stomach cancer, Prostate cancer, or CAD.   Allergies No Known Allergies   Home Medications  Prior to Admission medications   Medication Sig Start Date End Date Taking? Authorizing Provider  acetaminophen (TYLENOL) 325 MG tablet Take 1-2 tablets (325-650 mg total) by mouth every 6 (six) hours as needed for mild pain (pain score 1-3 or temp > 100.5). 03/13/23  Yes Cammy Copa, MD  ALPRAZolam Prudy Feeler) 0.5 MG tablet Take 0.5 mg by mouth at bedtime as needed for anxiety or sleep.   Yes [provider]  amLODipine (NORVASC) 10 MG tablet TAKE 1 TABLET DAILY 07/06/23   Yes Bradd Canary, MD  aspirin 81 MG chewable tablet Chew 1 tablet (81 mg total) by mouth 2 (two) times daily. 07/25/22  Yes Magnant, Charles L, PA-C  benazepril (LOTENSIN) 40 MG tablet Take 20 mg by mouth daily.   Yes [provider]  Cholecalciferol 25 MCG (1000 UT) capsule Take 1,000 Units by mouth daily.   Yes [provider]  hydrocortisone (ANUSOL-HC) 25 MG suppository Place 25 mg rectally 2 (two) times daily as needed for anal itching.   Yes [provider]  ibuprofen (ADVIL) 800 MG tablet Take 800 mg by mouth 3 (three) times daily.   Yes [provider]  Iron, Ferrous Sulfate, 325 (65 Fe) MG TABS Take 325 mg by mouth daily. 11/12/22  Yes Bradd Canary, MD  losartan (COZAAR) 50 MG tablet Take 1 tablet (50 mg total) by mouth daily. 05/14/23  Yes Bradd Canary, MD  metoprolol succinate (TOPROL-XL) 50 MG 24 hr tablet TAKE AS INSTRUCTED BY YOUR PRESCRIBER Patient taking differently: Take 50 mg by mouth daily. 01/12/23  Yes Bradd Canary, MD  Multiple Vitamins-Minerals (MULTIVITAMIN ADULT PO) Take 2 tablets by mouth daily.   Yes [provider]  rosuvastatin (CRESTOR) 20 MG tablet Take 1 tablet (20 mg total) by mouth daily. 10/22/22  Yes Bradd Canary, MD  sildenafil (VIAGRA) 100 MG tablet Take 100 mg by mouth as needed for erectile dysfunction (1 hour prior to intercourse). 07/18/23  Yes [provider]  tamsulosin (FLOMAX) 0.4 MG CAPS capsule Take 0.4 mg by mouth daily. 03/26/21  Yes [provider]  traMADol (ULTRAM) 50 MG tablet Take 50 mg by mouth every 6 (six) hours as needed for moderate pain (pain score 4-6).   Yes [provider]  gabapentin (NEURONTIN) 300 MG capsule Take 300 mg by mouth 3 (three) times daily. Patient not taking: Reported on 08/12/2023    [provider]  meloxicam (MOBIC) 15 MG tablet Take 15 mg by mouth daily. Patient not taking: Reported on 08/12/2023    [provider]   methocarbamol (ROBAXIN) 500 MG tablet Take 500 mg by mouth daily. Patient not taking: Reported on 08/12/2023    [provider]  oxyCODONE (OXY IR/ROXICODONE) 5 MG immediate release tablet Take 5 mg by mouth every 4 (four) hours as needed for severe pain (pain score 7-10). Patient not taking: Reported on 08/12/2023    [provider]  Pitavastatin Magnesium 4 MG TABS Take 2 mg by mouth at bedtime. Patient not taking: Reported on 08/12/2023    [provider]  traZODone (DESYREL) 50 MG tablet Take 50 mg by mouth at bedtime. Patient not taking: Reported on 08/12/2023    [provider]     Critical care time: 

## 2023-08-20 NOTE — Anesthesia Preprocedure Evaluation (Addendum)
Anesthesia Evaluation  Patient identified by MRN, date of birth, ID band Patient awake    Reviewed: Allergy & Precautions, NPO status , Patient's Chart, lab work & pertinent test results, reviewed documented beta blocker date and time   History of Anesthesia Complications Negative for: history of anesthetic complications  Airway Mallampati: I  TM Distance: >3 FB Neck ROM: Full    Dental  (+) Chipped, Missing, Dental Advisory Given   Pulmonary neg pulmonary ROS   breath sounds clear to auscultation       Cardiovascular hypertension, Pt. on medications and Pt. on home beta blockers (-) angina  Rhythm:Regular Rate:Tachycardia     Neuro/Psych  PSYCHIATRIC DISORDERS (PTSD) Anxiety     negative neurological ROS     GI/Hepatic Neg liver ROS,,,SBO/perf bowel   Endo/Other  obese  Renal/GU negative Renal ROS     Musculoskeletal  (+) Arthritis ,    Abdominal   Peds  Hematology Hb 11.8, plt 359k   Anesthesia Other Findings   Reproductive/Obstetrics                             Anesthesia Physical Anesthesia Plan  ASA: 3 and emergent  Anesthesia Plan: General   Post-op Pain Management:    Induction: Intravenous and Rapid sequence  PONV Risk Score and Plan: 2 and Ondansetron and Dexamethasone  Airway Management Planned: Oral ETT  Additional Equipment: None  Intra-op Plan:   Post-operative Plan: Extubation in OR  Informed Consent: I have reviewed the patients History and Physical, chart, labs and discussed the procedure including the risks, benefits and alternatives for the proposed anesthesia with the patient or authorized representative who has indicated his/her understanding and acceptance.     Dental advisory given  Plan Discussed with: CRNA and Surgeon  Anesthesia Plan Comments:         Anesthesia Quick Evaluation

## 2023-08-20 NOTE — Progress Notes (Signed)
Patient seen and examined. Plan exlap for pneumoperitoneum and suspicion of perforated bowel. Informed consent obtained. Discussion possible complications such as anastomotic leak, abscess, fistula, ostomy creation, need for return to OR. All questions answered from patient, wife, and sister.   Diamantina Monks, MD General and Trauma Surgery [facility]

## 2023-08-20 NOTE — Progress Notes (Signed)
400 ML output on NG tube 32fr

## 2023-08-21 ENCOUNTER — Encounter (HOSPITAL_COMMUNITY): Payer: Self-pay | Admitting: Surgery

## 2023-08-21 ENCOUNTER — Other Ambulatory Visit: Payer: Self-pay

## 2023-08-21 DIAGNOSIS — K631 Perforation of intestine (nontraumatic): Secondary | ICD-10-CM

## 2023-08-21 DIAGNOSIS — K668 Other specified disorders of peritoneum: Secondary | ICD-10-CM | POA: Diagnosis not present

## 2023-08-21 DIAGNOSIS — A419 Sepsis, unspecified organism: Secondary | ICD-10-CM

## 2023-08-21 DIAGNOSIS — K56609 Unspecified intestinal obstruction, unspecified as to partial versus complete obstruction: Secondary | ICD-10-CM | POA: Diagnosis not present

## 2023-08-21 DIAGNOSIS — E46 Unspecified protein-calorie malnutrition: Secondary | ICD-10-CM | POA: Insufficient documentation

## 2023-08-21 DIAGNOSIS — E440 Moderate protein-calorie malnutrition: Secondary | ICD-10-CM | POA: Insufficient documentation

## 2023-08-21 LAB — CBC WITH DIFFERENTIAL/PLATELET
Abs Immature Granulocytes: 0.12 10*3/uL — ABNORMAL HIGH (ref 0.00–0.07)
Basophils Absolute: 0 10*3/uL (ref 0.0–0.1)
Basophils Relative: 0 %
Eosinophils Absolute: 0 10*3/uL (ref 0.0–0.5)
Eosinophils Relative: 0 %
HCT: 37 % — ABNORMAL LOW (ref 39.0–52.0)
Hemoglobin: 12.6 g/dL — ABNORMAL LOW (ref 13.0–17.0)
Immature Granulocytes: 1 %
Lymphocytes Relative: 4 %
Lymphs Abs: 0.6 10*3/uL — ABNORMAL LOW (ref 0.7–4.0)
MCH: 27.9 pg (ref 26.0–34.0)
MCHC: 34.1 g/dL (ref 30.0–36.0)
MCV: 81.9 fL (ref 80.0–100.0)
Monocytes Absolute: 0.6 10*3/uL (ref 0.1–1.0)
Monocytes Relative: 4 %
Neutro Abs: 15.2 10*3/uL — ABNORMAL HIGH (ref 1.7–7.7)
Neutrophils Relative %: 91 %
Platelets: 390 10*3/uL (ref 150–400)
RBC: 4.52 MIL/uL (ref 4.22–5.81)
RDW: 13.1 % (ref 11.5–15.5)
WBC: 16.5 10*3/uL — ABNORMAL HIGH (ref 4.0–10.5)
nRBC: 0 % (ref 0.0–0.2)

## 2023-08-21 LAB — COMPREHENSIVE METABOLIC PANEL
ALT: 29 U/L (ref 0–44)
AST: 20 U/L (ref 15–41)
Albumin: 2.4 g/dL — ABNORMAL LOW (ref 3.5–5.0)
Alkaline Phosphatase: 55 U/L (ref 38–126)
Anion gap: 14 (ref 5–15)
BUN: 19 mg/dL (ref 8–23)
CO2: 24 mmol/L (ref 22–32)
Calcium: 8.4 mg/dL — ABNORMAL LOW (ref 8.9–10.3)
Chloride: 94 mmol/L — ABNORMAL LOW (ref 98–111)
Creatinine, Ser: 1.25 mg/dL — ABNORMAL HIGH (ref 0.61–1.24)
GFR, Estimated: >60 mL/min (ref >60)
Glucose, Bld: 140 mg/dL — ABNORMAL HIGH (ref 70–99)
Potassium: 4.6 mmol/L (ref 3.5–5.1)
Sodium: 132 mmol/L — ABNORMAL LOW (ref 135–145)
Total Bilirubin: 1.3 mg/dL — ABNORMAL HIGH (ref 0.0–1.2)
Total Protein: 5.8 g/dL — ABNORMAL LOW (ref 6.5–8.1)

## 2023-08-21 LAB — PHOSPHORUS: Phosphorus: 4.5 mg/dL (ref 2.5–4.6)

## 2023-08-21 LAB — PREALBUMIN: Prealbumin: <5 mg/dL — ABNORMAL LOW (ref 18–38)

## 2023-08-21 LAB — TYPE AND SCREEN
ABO/RH(D): A POS
Antibody Screen: NEGATIVE

## 2023-08-21 LAB — GLUCOSE, CAPILLARY
Glucose-Capillary: 152 mg/dL — ABNORMAL HIGH (ref 70–99)
Glucose-Capillary: 158 mg/dL — ABNORMAL HIGH (ref 70–99)
Glucose-Capillary: 171 mg/dL — ABNORMAL HIGH (ref 70–99)

## 2023-08-21 LAB — MAGNESIUM: Magnesium: 2 mg/dL (ref 1.7–2.4)

## 2023-08-21 LAB — TRIGLYCERIDES: Triglycerides: 42 mg/dL (ref <150)

## 2023-08-21 MED ORDER — INSULIN ASPART 100 UNIT/ML IJ SOLN
0.0000 [IU] | INTRAMUSCULAR | Status: DC
  Administered 2023-08-21 – 2023-08-22 (×7): 3 [IU] via SUBCUTANEOUS
  Administered 2023-08-23 – 2023-08-24 (×5): 2 [IU] via SUBCUTANEOUS

## 2023-08-21 MED ORDER — SODIUM CHLORIDE 0.9 % IV SOLN: INTRAVENOUS | Status: DC

## 2023-08-21 MED ORDER — LORAZEPAM 2 MG/ML IJ SOLN
0.5000 mg | Freq: Once | INTRAMUSCULAR | Status: AC | PRN
  Administered 2023-08-21: 0.5 mg via INTRAVENOUS
  Filled 2023-08-21: qty 1

## 2023-08-21 MED ORDER — SODIUM CHLORIDE 0.9% FLUSH
10.0000 mL | Freq: Two times a day (BID) | INTRAVENOUS | Status: DC
  Administered 2023-08-21 – 2023-08-28 (×10): 10 mL

## 2023-08-21 MED ORDER — TRAVASOL 10 % IV SOLN
INTRAVENOUS | Status: AC
  Filled 2023-08-21: qty 400

## 2023-08-21 MED ORDER — FENTANYL CITRATE PF 50 MCG/ML IJ SOSY
25.0000 ug | PREFILLED_SYRINGE | INTRAMUSCULAR | Status: DC | PRN
  Administered 2023-08-21 – 2023-08-22 (×7): 50 ug via INTRAVENOUS
  Filled 2023-08-21 (×7): qty 1

## 2023-08-21 MED ORDER — PHENOL 1.4 % MT LIQD
1.0000 | OROMUCOSAL | Status: DC | PRN
  Filled 2023-08-21: qty 177

## 2023-08-21 MED ORDER — SODIUM CHLORIDE 0.9% FLUSH: 10.0000 mL | INTRAVENOUS | Status: DC | PRN

## 2023-08-21 MED ORDER — PIPERACILLIN-TAZOBACTAM 3.375 G IVPB
3.3750 g | Freq: Three times a day (TID) | INTRAVENOUS | Status: AC
  Administered 2023-08-21 – 2023-08-27 (×20): 3.375 g via INTRAVENOUS
  Filled 2023-08-21 (×21): qty 50

## 2023-08-21 NOTE — Progress Notes (Signed)
PHARMACY - TOTAL PARENTERAL NUTRITION CONSULT NOTE   Indication: Prolonged ileus  Patient Measurements: Weight: 97.8 kg (215 lb 9.8 oz)   Body mass index is 30.94 kg/m. Usual Weight: 97.1kg on admission, 93kg in November 2024   Assessment:  Patient admitted on 2/12 for abdominal pain and was found to have a partial small bowel obstruction and suspected some aspect of viral enteritis with ileus. Hx of 2 prior small bowel resections. Patient had CLD and FLD ordered throughout this time but did not tolerate well since 2/14 due to abdominal distention. Ileus resolved on 2/16 and surgery signed off but patient remained to have minimal PO intake and abdominal pain. Surgery re-consulted 2/20 due to concern for pneumoperitoneum and suspected perforated bowel, patient underwent a ex-lap on 2/20 which found two areas of perforation in the distal jejunum, abdomen left open and in discontinuity after OR.   Pharmacy consulted to dose TPN.   Glucose / Insulin: hx of pre-diabetes, A1C 6.0% 3 months prior, no meds PTA, CBG 106-140  Electrolytes: Na: 132, K: 4.6, Cl: 94, HCO3: 24, phos: 4.5, mag: 2.0, CoCa: 9.7, CaPhos product 43.65  Renal: mild AKI, Scr: 1.25 (baseline ~ 1.0), BUN: 19  Hepatic: ALP: 55, AST/ALT WNL, T-bili: 1.3, TG: 42, albumin: 2.4  Intake / Output; MIVF: - UOP, - NG output, - drain, - EBL, LR 34mL/hr off this AM, +1.9L for admission, reglan 10 Q6H GI Imaging: 2/20 CT ab: pneumoperitoneum suspected perforation  2/20 KUB: concerning for SOB  2/19 CT ab: concerning for ileus, gas throughout small bowel not suspicious for pneumatosis  2/11: CT ab: favor SBO than ileus  GI Surgeries / Procedures:  2/20: ex-lap with small bowel repair, abdomen left open   Central access: PICC to be placed this AM  TPN start date: 2/21   Nutritional Goals: Goal TPN rate is 90 mL/hr (provides 120 g of protein and 2301 kcals per day)  RD Assessment: 120-135g protein  2300-2600kcal      Current Nutrition:  NPO and TPN  Plan:  Start TPN at 25mL/hr at 1800, providing 766kcal and 40g protein meeting approximately 33% of nutritional needs. CHO load 110g, lower than initial starting rate due to noted refeeding risk with low oral intake since admission on 2/12.  Electrolytes in TPN: Na 19mEq/L, K 20mEq/L, Ca 18mEq/L, Mg 30mEq/L, and Phos 69mmol/L. Cl:Ac 2:1 May be able to increase electrolytes in TPN on 2/22 if mild AKI resolves.  Add standard MVI and trace elements to TPN Initiate Moderate q4h SSI and adjust as needed  Monitor TPN labs on Mon/Thurs, daily until stabilization.   Estill Batten, PharmD, BCCCP  08/21/2023,7:18 AM

## 2023-08-21 NOTE — Progress Notes (Signed)
1 Day Post-Op   Subjective/Chief Complaint: [name] comfortable Reports minimal pain No SOB   Objective: Vital signs in last 24 hours: Temp:  [98 F (36.7 C)-101.2 F (38.4 C)] 98.1 F (36.7 C) (02/21 0400) Pulse Rate:  [103-118] 118 (02/21 1000) Resp:  [11-190] 20 (02/21 1000) BP: (115-133)/(61-93) 127/88 (02/21 1000) SpO2:  [93 %-99 %] 97 % (02/21 1000) Weight:  [97.8 kg] 97.8 kg (02/21 0802) Last BM Date : 08/18/23  Intake/Output from previous day: 02/20 0701 - 02/21 0700 In: 1740.4 [I.V.:1390.4; IV Piggyback:350] Out: 1335 [Urine:475; Emesis/NG output:500; Drains:260; Blood:100] Intake/Output this shift: Total I/O In: 402.6 [I.V.:375; IV Piggyback:27.6] Out: -   Exam: Awake and alert Looks comfortable Abdomen softer, VAC in place  Lab Results:  Recent Labs    08/20/23 0618 08/21/23 0824  WBC 9.0 16.5*  HGB 11.8* 12.6*  HCT 33.8* 37.0*  PLT 359 390   BMET Recent Labs    08/20/23 0618 08/21/23 0457  NA 131* 132*  K 4.1 4.6  CL 94* 94*  CO2 26 24  GLUCOSE 110* 140*  BUN 11 19  CREATININE 1.05 1.25*  CALCIUM 8.7* 8.4*   [name]/INR No results for input(s): "LABPROT", "INR" in the last 72 hours. ABG No results for input(s): "PHART", "HCO3" in the last 72 hours.  Invalid input(s): "PCO2", "PO2"  Studies/Results: [place] EKG SITE RITE Result Date: 08/21/2023 If [facility] image not attached, placement could not be confirmed due to current cardiac rhythm.  DG Abd 1 View Result Date: 08/20/2023 CLINICAL DATA:  [id number] NG (nasogastric) tube fed newborn [id number] EXAM: ABDOMEN - 1 VIEW COMPARISON:  X-ray abdomen 08/20/2023 12:19 p.m. FINDINGS: Enteric tube coursing below the hemidiaphragm with tip and side port overlying the expected region of the gastric lumen. Surgical drain overlying the left mid abdomen. Interval resolution of small bowel gaseous dilatation. No radio-opaque calculi or other significant radiographic abnormality are seen. IMPRESSION: Enteric tube in good  position. Electronically Signed   By: Tish Frederickson M.D.   On: 08/20/2023 21:09   CT ABDOMEN PELVIS WO CONTRAST Result Date: 08/20/2023 CLINICAL DATA:  Peritonitis or perforation suspected. Patient presented with diffuse bowel distension and head radiographs today concerning for pneumoperitoneum. EXAM: CT ABDOMEN AND PELVIS WITHOUT CONTRAST TECHNIQUE: Multidetector CT imaging of the abdomen and pelvis was performed following the standard protocol without IV contrast. RADIATION DOSE REDUCTION: This exam was performed according to the departmental dose-optimization program which includes automated exposure control, adjustment of the mA and/or kV according to patient size and/or use of iterative reconstruction technique. COMPARISON:  Radiographs earlier the same date. Abdominopelvic CT 08/19/2023 and 08/11/2023. FINDINGS: Lower chest: Trace right pleural effusion with mild dependent atelectasis at both lung bases. No basilar pneumothorax. Hepatobiliary: No focal hepatic abnormalities are identified on noncontrast imaging. Hepatic low density suggesting steatosis. No evidence of gallstones, gallbladder wall thickening or biliary dilatation. Pancreas: Unremarkable. No pancreatic ductal dilatation or surrounding inflammatory changes. Spleen: Normal in size without focal abnormality. Adrenals/Urinary Tract: Both adrenal glands appear normal. Unchanged low-density and hyperdense renal cysts bilaterally for which no specific follow-up imaging recommended. No evidence of urinary tract calculus, hydronephrosis or perinephric soft tissue stranding. The bladder appears unremarkable for its degree of distention. Stomach/Bowel: Enteric tube projects into the distal stomach. There is mildly improved gastric distension. There is persistent and slightly increased dilatation of the jejunum and proximal ileum with decompression of the distal small bowel. The colon is not distended. There are diverticular changes within the  descending and  sigmoid colon. The appendix appears normal. Vascular/Lymphatic: No enlarged abdominopelvic lymph nodes are identified. There is scattered aortic and branch vessel atherosclerosis without evidence of aneurysm. Reproductive: Stable mild enlargement of the prostate gland. Other: As suspected on earlier radiographs, there is moderate pneumoperitoneum with scattered air bubbles throughout the porta hepatis and omentum. There is a small amount of ascites within the right lower quadrant and pelvis. Findings are consistent with interval bowel or gastric perforation. The specific site of the perforation is not demonstrated by this examination performed without enteric and intravenous contrast. Musculoskeletal: No acute or significant osseous findings. Multilevel spondylosis with mild degenerative changes at both hips. IMPRESSION: 1. Moderate pneumoperitoneum and small amount of ascites consistent with interval bowel or gastric perforation. The specific site of the perforation is not demonstrated by this examination performed without enteric and intravenous contrast. Surgical evaluation warranted. 2. Persistent and slightly increased dilatation of the jejunum and proximal ileum with decompression of the distal small bowel. Findings remain suspicious for small bowel obstruction. 3. Enteric tube projects into the distal stomach. 4. Trace right pleural effusion with mild dependent atelectasis at both lung bases. 5. Aortic atherosclerosis. 6. These results will be called to the ordering clinician or representative by the Radiologist Assistant, and communication documented in the PACS or [employer]. Electronically Signed   By: Carey Bullocks M.D.   On: 08/20/2023 15:42   DG Abd Portable 1V Result Date: 08/20/2023 CLINICAL DATA:  Abdominal distension.  Nasogastric tube placement. EXAM: PORTABLE ABDOMEN - 1 VIEW COMPARISON:  Abdominopelvic CT and radiographs 08/19/2023. FINDINGS: 1219 hours. Single portable  supine view of the abdomen demonstrates the tip of an enteric tube overlying the proximal stomach. The stomach remains mildly distended. There are persistent dilated loops of small bowel. There is suspicion of new extraluminal air in the right upper quadrant and left mid abdomen. In the absence of interval abdominal surgery, findings are concerning for possible bowel perforation. IMPRESSION: 1. Enteric tube projects over the proximal stomach. 2. Persistent small bowel distension with concern for pneumoperitoneum. In the absence of interval surgery, findings are concerning for possible bowel perforation. Recommend general surgery consultation. Follow-up CT may be helpful for further evaluation. 3. These results will be called to the ordering clinician or representative by the Radiologist Assistant, and communication documented in the PACS or [employer]. Electronically Signed   By: Carey Bullocks M.D.   On: 08/20/2023 12:51   CT ABDOMEN PELVIS WO CONTRAST Result Date: 08/19/2023 CLINICAL DATA:  Abdominal pain, post-op Bowel obstruction suspected EXAM: CT ABDOMEN AND PELVIS WITHOUT CONTRAST TECHNIQUE: Multidetector CT imaging of the abdomen and pelvis was performed following the standard protocol without IV contrast. RADIATION DOSE REDUCTION: This exam was performed according to the departmental dose-optimization program which includes automated exposure control, adjustment of the mA and/or kV according to patient size and/or use of iterative reconstruction technique. COMPARISON:  Preoperative CT 08/11/2023 FINDINGS: Lower chest: Small right pleural effusion. Hepatobiliary: Unremarkable unenhanced appearance. Gallbladder physiologically distended, no calcified stone. No biliary dilatation. Pancreas: Unremarkable unenhanced appearance. Spleen: Normal in size without focal abnormality. Adrenals/Urinary Tract: No adrenal nodule. No hydronephrosis. Bilateral hypodense and hyperdense renal lesions, better  characterized on prior contrast-enhanced exam. Unremarkable urinary bladder. Stomach/Bowel: Moderate gaseous gastric distension. Small bowel is diffusely dilated and fluid-filled. There is gradual transition from dilated to nondilated in the distal ileum. No specific transition point. Scattered foci of small bowel gas all appears to be non dependent and likely represents air within the bowel rather  than pneumatosis. Moderate edema within the small bowel mesentery. There is also diffuse distension of the ascending and transverse colon with air and fluid. The left colon is less dilated. No wall thickening or abnormality in the area of transition. Left colonic diverticulosis without diverticulitis. Normal appendix. Vascular/Lymphatic: Aortic atherosclerosis. No aneurysm. There is no portal venous or mesenteric gas. No bulky adenopathy. Reproductive: Enlarged prostate again seen. Other: Small amount of free fluid in the small bowel mesentery and dependent pelvis. No abscess. No free air. No abdominal wall collection. Musculoskeletal: Multilevel degenerative change throughout the spine. Moderate bilateral hip osteoarthritis. There are no acute or suspicious osseous abnormalities. IMPRESSION: 1. Diffusely dilated and fluid-filled small bowel with gradual transition from dilated to nondilated in the distal ileum. No specific transition point. There is also diffuse distension of the ascending and transverse colon with air and fluid. Findings are favored to represent ileus. 2. Scattered foci of gas throughout the small bowel appears to be non dependent and within the bowel lumen rather than pneumatosis. 3. Small amount of free fluid in the small bowel mesentery and dependent pelvis. No abscess. No free air. 4. Small right pleural effusion. 5. Bilateral hypodense and hyperdense renal lesions, better characterized on prior contrast-enhanced exam. Electronically Signed   By: Narda Rutherford M.D.   On: 08/19/2023 16:22     Anti-infectives: Anti-infectives (From admission, onward)    Start     Dose/Rate Route Frequency Ordered Stop   08/21/23 2200  piperacillin-tazobactam (ZOSYN) IVPB 3.375 g  Status:  Discontinued       Placed in "Followed by" Linked Group   3.375 g 12.5 mL/hr over 240 Minutes Intravenous Every 8 hours 08/20/23 1526 08/21/23 0638   08/21/23 0700  piperacillin-tazobactam (ZOSYN) IVPB 3.375 g       Placed in "Followed by" Linked Group   3.375 g 12.5 mL/hr over 240 Minutes Intravenous Every 8 hours 08/21/23 0638     08/20/23 1615  piperacillin-tazobactam (ZOSYN) IVPB 3.375 g       Placed in "Followed by" Linked Group   3.375 g 100 mL/hr over 30 Minutes Intravenous  Once 08/20/23 1526 08/20/23 1614       Assessment/Plan: [name] s/p exp lap, small bowel resection for 2 separate areas of perforation, left in discontinuity, placement of abthera wound vac 2/21 by AL  -patient currently stable -place OR tomorrow in hopes of reanastomosing small bowel back together.  I discussed the risks which includes but is not limited to bleeding, infection, the need for further resection, anastomotic leak/breakdown, the need for further procedures, VAC replacement, cardiopulmonary issues, etc. He agrees to proceed.   Abigail Miyamoto MD 08/21/2023

## 2023-08-21 NOTE — Progress Notes (Signed)
OT Cancellation Note  Patient Details Name: [name] MRN: [id number] DOB: [birth year]   Cancelled Treatment:    Reason Eval/Treat Not Completed: Patient at procedure or test/ unavailable.  PICC line being placed, OT to continue efforts.    Keshana Klemz D Chrystal Zeimet 08/21/2023, 3:35 PM 08/21/2023  RP, OTR/L  Acute Rehabilitation Services  Office:  [phone]

## 2023-08-21 NOTE — Evaluation (Signed)
Physical Therapy Evaluation Patient Details Name: [name] MRN: [id number] DOB: 1945/07/06 Today's Date: 08/21/2023  History of Present Illness  Pt is a 78 y/o M presenting to ED On 2/12 wtih abdominal pain, admitted for SBO vs enteritis, GI +norovirus. S/p ex lap, SB resection, wound vac placement on 2/20. PMH includes HTN, HLD, SBO, back pain, prediabetes, osteoporosis, L TSA, R reverse shoulder arthroplasty, R TKA.  Clinical Impression   Pt presents with generalized weakness, abdominal pain, decreased knowledge and application of abdominal precautions, impaired balance, and decreased activity tolerance secondary to pain. Pt to benefit from acute PT to address deficits. Pt overall requiring min-mod assist for transfer into standing this date, limited by abdominal pain and tachycardia to 140 bpm. PT anticipates pt will progress well mobility-wise.  PT to progress mobility as tolerated, and will continue to follow acutely.          If plan is discharge home, recommend the following: A little help with walking and/or transfers;A little help with bathing/dressing/bathroom   Can travel by private vehicle        Equipment Recommendations None recommended by PT  Recommendations for Other Services       Functional Status Assessment Patient has had a recent decline in their functional status and demonstrates the ability to make significant improvements in function in a reasonable and predictable amount of time.     Precautions / Restrictions Precautions Precautions: Fall Precaution/Restrictions Comments: abd wound vac, NGT, L JP drain Restrictions Weight Bearing Restrictions Per Provider Order: No      Mobility  Bed Mobility Overal bed mobility: Needs Assistance Bed Mobility: Rolling, Sidelying to Sit, Sit to Sidelying Rolling: Min assist Sidelying to sit: Mod assist, HOB elevated     Sit to sidelying: Mod assist, HOB elevated General bed mobility comments: assist for trunk  elevation, LE lift into bed. cues for log roll technique    Transfers Overall transfer level: Needs assistance Equipment used: 1 person hand held assist Transfers: Sit to/from Stand, Bed to chair/wheelchair/BSC Sit to Stand: Min assist   Step pivot transfers: Min assist       General transfer comment: light rise and steady assist, lateral stepping towards HOB. limited by tachycardia up to 140 bpm    Ambulation/Gait                  Stairs            Wheelchair Mobility     Tilt Bed    Modified Rankin (Stroke Patients Only)       Balance Overall balance assessment: Mild deficits observed, not formally tested                                           Pertinent Vitals/Pain Pain Assessment Pain Assessment: 0-10 Pain Score: 9  Pain Location: abdomen Pain Descriptors / Indicators: Dull, Grimacing, Guarding Pain Intervention(s): Limited activity within patient's tolerance, Monitored during session, Repositioned    Home Living Family/patient expects to be discharged to:: Private residence Living Arrangements: Spouse/significant other Available Help at Discharge: Family;Available 24 hours/day Type of Home: House Home Access: Stairs to enter   Entrance Stairs-Number of Steps: 2 Alternate Level Stairs-Number of Steps: 14 Home Layout: Two level;Bed/bath upstairs Home Equipment: Rolling Walker (2 wheels);Cane - single point;BSC/3in1;Shower seat      Prior Function Prior Level of Function : Independent/Modified Independent;Driving  Mobility Comments: ind without AD ADLs Comments: ind, drives     Extremity/Trunk Assessment   Upper Extremity Assessment Upper Extremity Assessment: Defer to OT evaluation    Lower Extremity Assessment Lower Extremity Assessment: Generalized weakness    Cervical / Trunk Assessment Cervical / Trunk Assessment: Other exceptions Cervical / Trunk Exceptions: s/p abdominal surgery   Communication   Communication Communication: No apparent difficulties    Cognition Arousal: Alert Behavior During Therapy: WFL for tasks assessed/performed                             Following commands: Intact       Cueing       General Comments General comments (skin integrity, edema, etc.): NGT to wall suction, midline abdominal incision hooked to wound vac, tachycardia up to 140 bpm    Exercises     Assessment/Plan    PT Assessment Patient needs continued PT services  PT Problem List Decreased strength;Decreased mobility;Decreased safety awareness;Decreased knowledge of precautions;Decreased activity tolerance;Decreased balance;Pain;Cardiopulmonary status limiting activity       PT Treatment Interventions Gait training;Therapeutic exercise;Patient/family education;Therapeutic activities;DME instruction;Stair training;Balance training;Functional mobility training;Neuromuscular re-education    PT Goals (Current goals can be found in the Care Plan section)  Acute Rehab PT Goals PT Goal Formulation: With patient Time For Goal Achievement: 09/04/23 Potential to Achieve Goals: Good    Frequency Min 1X/week     Co-evaluation               AM-PAC PT "6 Clicks" Mobility  Outcome Measure Help needed turning from your back to your side while in a flat bed without using bedrails?: A Little Help needed moving from lying on your back to sitting on the side of a flat bed without using bedrails?: A Little Help needed moving to and from a bed to a chair (including a wheelchair)?: A Little Help needed standing up from a chair using your arms (e.g., wheelchair or bedside chair)?: A Little Help needed to walk in hospital room?: A Lot Help needed climbing 3-5 steps with a railing? : A Lot 6 Click Score: 16    End of Session   Activity Tolerance: Patient limited by pain Patient left: in bed;with call bell/phone within reach;with bed alarm set Nurse  Communication: Mobility status PT Visit Diagnosis: Other abnormalities of gait and mobility (R26.89);Muscle weakness (generalized) (M62.81)    Time: 1610-9604 PT Time Calculation (min) (ACUTE ONLY): 19 min   Charges:   PT Evaluation $PT Eval Low Complexity: 1 Low   PT General Charges $$ ACUTE PT VISIT: 1 Visit         Marye Round, PT DPT Acute Rehabilitation Services Secure Chat Preferred  Office [phone]   Rebel Willcutt Sheliah Plane 08/21/2023, 10:51 AM

## 2023-08-21 NOTE — Progress Notes (Signed)
Peripherally Inserted Central Catheter Placement  The IV Nurse has discussed with the patient and/or persons authorized to consent for the patient, the purpose of this procedure and the potential benefits and risks involved with this procedure.  The benefits include less needle sticks, lab draws from the catheter, and the patient may be discharged home with the catheter. Risks include, but not limited to, infection, bleeding, blood clot (thrombus formation), and puncture of an artery; nerve damage and irregular heartbeat and possibility to perform a PICC exchange if needed/ordered by physician.  Alternatives to this procedure were also discussed.  Bard Power PICC patient education guide, fact sheet on infection prevention and patient information card has been provided to patient /or left at bedside.    PICC Placement Documentation  PICC Double Lumen 08/21/23 Right Brachial 43 cm 1 cm (Active)  Indication for Insertion or Continuance of Line Administration of hyperosmolar/irritating solutions (i.e. TPN, Vancomycin, etc.) 08/21/23 1536  Exposed Catheter (cm) 1 cm 08/21/23 1536  Site Assessment Clean, Dry, Intact 08/21/23 1536  Lumen #1 Status Flushed;Saline locked;Blood return noted 08/21/23 1536  Lumen #2 Status Flushed;Saline locked;Blood return noted 08/21/23 1536  Dressing Type Transparent;Securing device 08/21/23 1536  Dressing Status Antimicrobial disc/dressing in place;Clean, Dry, Intact 08/21/23 1536  Line Care Connections checked and tightened 08/21/23 1536  Line Adjustment (NICU/IV Team Only) No 08/21/23 1536  Dressing Intervention New dressing;Adhesive placed at insertion site (IV team only);Other (Comment) 08/21/23 1536  Dressing Change Due 08/28/23 08/21/23 1536       Annett Fabian 08/21/2023, 3:37 PM

## 2023-08-21 NOTE — Anesthesia Preprocedure Evaluation (Addendum)
 Anesthesia Evaluation  Patient identified by MRN, date of birth, ID band Patient awake    Reviewed: Allergy & Precautions, NPO status , Patient's Chart, lab work & pertinent test results  History of Anesthesia Complications Negative for: history of anesthetic complications  Airway Mallampati: II  TM Distance: >3 FB Neck ROM: Full    Dental  (+) Dental Advisory Given, Teeth Intact, Missing   Pulmonary pneumonia   Pulmonary exam normal breath sounds clear to auscultation       Cardiovascular hypertension, Pt. on medications and Pt. on home beta blockers (-) angina  Rhythm:Regular Rate:Tachycardia     Neuro/Psych  PSYCHIATRIC DISORDERS (PTSD) Anxiety     negative neurological ROS     GI/Hepatic Neg liver ROS,,,SBO/perf bowel   Endo/Other  obese  Renal/GU Renal disease     Musculoskeletal  (+) Arthritis ,    Abdominal   Peds  Hematology  (+) Blood dyscrasia, anemia Hb 11.8, plt 359k   Anesthesia Other Findings   Reproductive/Obstetrics                             Anesthesia Physical Anesthesia Plan  ASA: 3  Anesthesia Plan: General   Post-op Pain Management: Ofirmev IV (intra-op)*   Induction: Intravenous and Rapid sequence  PONV Risk Score and Plan: 4 or greater and Ondansetron, Dexamethasone and Treatment may vary due to age or medical condition  Airway Management Planned: Oral ETT  Additional Equipment: None  Intra-op Plan:   Post-operative Plan: Extubation in OR  Informed Consent: I have reviewed the patients History and Physical, chart, labs and discussed the procedure including the risks, benefits and alternatives for the proposed anesthesia with the patient or authorized representative who has indicated his/her understanding and acceptance.     Dental advisory given  Plan Discussed with: CRNA  Anesthesia Plan Comments:         Anesthesia Quick  Evaluation

## 2023-08-21 NOTE — Progress Notes (Signed)
Patient was not seen and examined today as case was discussed with Dr. Lynnell Catalan of PCCM and patient was seen by him this morning.  After further discussion critical care will take over and manage his medical issues given that he is likely to go for multiple other surgeries and remained in the ICU.  Dr. Denese Killings states the [facility] can sign off for now and that once he is medically ready to be transferred out of the ICU he will be transferred back to our service.

## 2023-08-21 NOTE — Consult Note (Signed)
NAME:  [name], MRN:  [id number], DOB:  Feb 10, 1946, LOS: 8 ADMISSION DATE:  08/12/2023, CONSULTATION DATE:  08/20/23 REFERRING MD:  Dr Bedelia Person, CHIEF COMPLAINT:  icu admission 2/2 open abdomen   History of Present Illness:  77 yo male admitted to hospital ~8 days ago after being seen by pcp on 2/12 due to diffuse abdominal pain. They advised him to seek care at ED after no outpt radiology appts for ct were available. At that time pt endorsed N/D but no f/c or emesis. Abdominal pain was reported to be periumbilical without radiation. Pt was having normal BM till Sunday 2/9, after then reports 5-6 bm per day with associated "dry heaves" but no frank emesis. Denied at that time any recent illnesses or sick contacts. At presentation he was admitted to [facility] with surgery consult and after evaluation felt 2/2 to have ileus and plan for conservative management.   During hospitalization pt was found to be norovirus positive and was continued with supportive care. He was able to tolerate oral diet as af 2/14. Pt's abdominal exam progressed per chart review req surgery to re-engage 2/15. Pt has been unable to tolerate soft diet only liquid since decline from 2/14. Gi was consulted 2/17 to provide recommendations, they had multiple recommendations, involving decreased narcotic use, increase movement and gi motility medications in order to stimulate bowel activity. 2/19 pt's abdominal pain increased, he was unable to tolerate clear liquids, he was made NPO and ct abdomen scan obtained which revealed diffusely dilated and fluid filled sb with distention of the colon as well. He was given a dose of relistor in an attempt to help however 2/20 pt had continued n/v and distention without bm and imaging revealed continued/if not worsening distension, concern for perforation with ? Pneumoperitoneum and he was taken to OR after initiation of abx.   Taken to OR today for exlap, small bowel resection required after finding of  pneumoperitoneum 2/2 sb perforation.   Pt was able to be extubated post operatively and at this time hemodynamically stable however surgery has requested monitoring in ICU for which ICU has been consulted.   Pertinent  Medical History  Htn Hyperlipidemia H/o sbo  Significant Hospital Events: Including procedures, antibiotic start and stop dates in addition to other pertinent events   Admitted 2/12 Surgery consulted 2./12 due to ? Sbo Gi consulted 2/17 due to persistent gi dysmotility OR with surgery 2/20, left open for further eval and admitted to ICU.  Small bowel perforation found in the jejunum.  Interim History / Subjective:  Feels well apart from incisional pain. Worked with PT.  Objective   Blood pressure 117/89, pulse (!) 119, temperature 98.4 F (36.9 C), temperature source Axillary, resp. rate 18, weight 97.8 kg, SpO2 98%.        Intake/Output Summary (Last 24 hours) at 08/21/2023 1531 Last data filed at 08/21/2023 1200 Gross per 24 hour  Intake 2374.28 ml  Output 1810 ml  Net 564.28 ml   Filed Weights   08/21/23 0802  Weight: 97.8 kg    Examination: General: Awake alert pleasant. HENT: Nasogastric tube in place with minimal drainage. Lungs: Normal vesicular breath sounds throughout Cardiovascular: Sinus tachycardia, heart sounds normal.  Extremities warm well-perfused. Abdomen: Distended with minimal bowel sounds.  Incisional tenderness.  VAC dressing in place. Extremities: No edema. Neuro: Clear sensorium with no focal deficits. GU: Normal urine output  Ancillary tests personally reviewed  Hyponatremia 132.  Creatinine 1.25  Assessment & Plan:  Acute  abdominal pain Sbo ileus Sb perforation with pneumoperitoneum Hyponatremia Malnutrition, albumin 2.7 Chronic normocytic anemia Sepsis 2/2 above  Plan:  -Appears adequately resuscitated.  Continue IV fluids and started TPN as delayed gastric function anticipated -Keep in ICU while abdomen is open.   Likely to go back for reanastomosis tomorrow. -Continue as needed fentanyl for pain.   Best Practice (right click and "Reselect all SmartList Selections" daily)   Diet/type: NPO DVT prophylaxis SCD Pressure ulcer(s): N/A GI prophylaxis: PPI Lines: N/A Foley:  Yes, and it is still needed Code Status:  full code Last date of multidisciplinary goals of care discussion [per primary]  Lynnell Catalan, MD [facility] ICU Physician [facility]  Pager: [phone] Or Epic Secure Chat After hours: [phone].  08/21/2023, 3:40 PM

## 2023-08-21 NOTE — Progress Notes (Signed)
Initial Nutrition Assessment  DOCUMENTATION CODES:   Non-severe (moderate) malnutrition in context of social or environmental circumstances  INTERVENTION:   Initiate TPN and advance to meet nutrition needs, discussed with Pharmacist.   Monitor for ability to advance diet post op; supplement as appropriate.    NUTRITION DIAGNOSIS:   Moderate Malnutrition related to social / environmental circumstances (poor appetite after shoulder surgery) as evidenced by moderate fat depletion, moderate muscle depletion.  GOAL:   Patient will meet greater than or equal to 90% of their needs  MONITOR:   Labs  REASON FOR ASSESSMENT:   Consult New TPN/TNA  ASSESSMENT:   Pt with PMH of HTN, HLD, SBO with lap ventral hernia repair with mesh 2019 now admitted after having abd pain, nausea, vomiting, and diarrhea since Monday 2/11. Dx with ileus vs SBO.   Pt discussed during ICU rounds and with RN and MD.  Pt with open abd left in discontinuity. Per CCS plan to return to OR 2/22 for possible reanastomosing SB back together.  Pt with NG for suction. Getting PICC today and starting TPN.    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 His usual weight had been 220 lb and at dr visit right before admission pt was down to 210 lb. He reports after this surgery he had no appetite and was not eating much. He feels he lost a lot of muscle at the time.  PTA he was eating 3 meals per day  Breakfast: egg and toast Lunch: sandwich Dinner: balanced meal at home No ONS. Does drink regular soda, prefers Dr Reino Kent and tea.    2/13 - admitted with ileus vs SBO 2/20 - s/p ex lap, SBR, abthera wound vac placement due to pneumoperitoneum secondary to SB perf x 2; pt left in discontinuity; extubated after surgery   Medications reviewed and include: SSI every 4 hours, 10 mg Reglan every 6 hours, protonix  Labs reviewed:  Na 132 K  4.6 Phos 4.5 Mag 2.0 TG 42 CBG 140  L abd JP: 10 ml VAC: 250 ml  16 F NG tube: side port in gastric lumen   NUTRITION - FOCUSED PHYSICAL EXAM:  Flowsheet Row Most Recent Value  Orbital Region Moderate depletion  Upper Arm Region Moderate depletion  Thoracic and Lumbar Region Moderate depletion  Buccal Region Moderate depletion  Temple Region Severe depletion  Clavicle Bone Region Severe depletion  Clavicle and Acromion Bone Region Moderate depletion  Scapular Bone Region Unable to assess  Dorsal Hand Mild depletion  Patellar Region Moderate depletion  Anterior Thigh Region Moderate depletion  Posterior Calf Region Moderate depletion  Edema (RD Assessment) None  Hair Reviewed  Eyes Reviewed  Mouth Reviewed  Skin Reviewed  Nails Reviewed       Diet Order:   Diet Order             Diet NPO time specified  Diet effective now                   EDUCATION NEEDS:   Education needs have been addressed  Skin:  Skin Assessment: Skin Integrity Issues: (abd incision) Skin Integrity Issues:: Wound VAC, Incisions Wound Vac: abd Incisions: abd  Last BM:  2/19 type 7 stool  Height:   Ht Readings from Last 1 Encounters:  08/11/23 5\' 10"  (1.778 m)    Weight:   Wt Readings from Last 1  Encounters:  08/21/23 97.8 kg    BMI:  Body mass index is 30.94 kg/m.  Estimated Nutritional Needs:   Kcal:  2300-2600  Protein:  120-135 grams  Fluid:  >2 L/day  Cammy Copa., RD, LDN, CNSC See AMiON for contact information

## 2023-08-22 ENCOUNTER — Inpatient Hospital Stay (HOSPITAL_COMMUNITY): Payer: Medicare Other | Admitting: Anesthesiology

## 2023-08-22 ENCOUNTER — Other Ambulatory Visit: Payer: Self-pay

## 2023-08-22 ENCOUNTER — Encounter (HOSPITAL_COMMUNITY): Payer: Self-pay | Admitting: Internal Medicine

## 2023-08-22 ENCOUNTER — Encounter (HOSPITAL_COMMUNITY)
Admission: EM | Disposition: A | Discharge status: Home / Self Care | Payer: Self-pay | Source: Home / Self Care | Attending: Internal Medicine

## 2023-08-22 DIAGNOSIS — A419 Sepsis, unspecified organism: Secondary | ICD-10-CM | POA: Diagnosis not present

## 2023-08-22 DIAGNOSIS — E785 Hyperlipidemia, unspecified: Secondary | ICD-10-CM

## 2023-08-22 DIAGNOSIS — N179 Acute kidney failure, unspecified: Secondary | ICD-10-CM | POA: Diagnosis not present

## 2023-08-22 DIAGNOSIS — K56609 Unspecified intestinal obstruction, unspecified as to partial versus complete obstruction: Secondary | ICD-10-CM | POA: Diagnosis not present

## 2023-08-22 DIAGNOSIS — J189 Pneumonia, unspecified organism: Secondary | ICD-10-CM

## 2023-08-22 DIAGNOSIS — K631 Perforation of intestine (nontraumatic): Secondary | ICD-10-CM | POA: Diagnosis not present

## 2023-08-22 DIAGNOSIS — K668 Other specified disorders of peritoneum: Secondary | ICD-10-CM | POA: Diagnosis not present

## 2023-08-22 HISTORY — PX: LAPAROTOMY: SHX154

## 2023-08-22 HISTORY — PX: ANAL ANASTOMOSIS: SHX5717

## 2023-08-22 LAB — GLUCOSE, CAPILLARY
Glucose-Capillary: 137 mg/dL — ABNORMAL HIGH (ref 70–99)
Glucose-Capillary: 152 mg/dL — ABNORMAL HIGH (ref 70–99)
Glucose-Capillary: 153 mg/dL — ABNORMAL HIGH (ref 70–99)
Glucose-Capillary: 159 mg/dL — ABNORMAL HIGH (ref 70–99)
Glucose-Capillary: 168 mg/dL — ABNORMAL HIGH (ref 70–99)
Glucose-Capillary: 169 mg/dL — ABNORMAL HIGH (ref 70–99)

## 2023-08-22 LAB — BASIC METABOLIC PANEL
Anion gap: 12 (ref 5–15)
BUN: 21 mg/dL (ref 8–23)
CO2: 26 mmol/L (ref 22–32)
Calcium: 8.4 mg/dL — ABNORMAL LOW (ref 8.9–10.3)
Chloride: 95 mmol/L — ABNORMAL LOW (ref 98–111)
Creatinine, Ser: 1.28 mg/dL — ABNORMAL HIGH (ref 0.61–1.24)
GFR, Estimated: 58 mL/min — ABNORMAL LOW (ref >60)
Glucose, Bld: 159 mg/dL — ABNORMAL HIGH (ref 70–99)
Potassium: 3.8 mmol/L (ref 3.5–5.1)
Sodium: 133 mmol/L — ABNORMAL LOW (ref 135–145)

## 2023-08-22 LAB — PHOSPHORUS: Phosphorus: 2.4 mg/dL — ABNORMAL LOW (ref 2.5–4.6)

## 2023-08-22 LAB — MAGNESIUM: Magnesium: 2.2 mg/dL (ref 1.7–2.4)

## 2023-08-22 SURGERY — LAPAROTOMY, EXPLORATORY
Anesthesia: General | Site: Abdomen

## 2023-08-22 MED ORDER — SUCCINYLCHOLINE CHLORIDE 200 MG/10ML IV SOSY
PREFILLED_SYRINGE | INTRAVENOUS | Status: DC | PRN
  Administered 2023-08-22: 120 mg via INTRAVENOUS

## 2023-08-22 MED ORDER — LIDOCAINE 2% (20 MG/ML) 5 ML SYRINGE
INTRAMUSCULAR | Status: AC
  Filled 2023-08-22: qty 5

## 2023-08-22 MED ORDER — 0.9 % SODIUM CHLORIDE (POUR BTL) OPTIME
TOPICAL | Status: DC | PRN
  Administered 2023-08-22: 2000 mL
  Administered 2023-08-22: 1000 mL

## 2023-08-22 MED ORDER — LACTATED RINGERS IV SOLN: INTRAVENOUS | Status: AC

## 2023-08-22 MED ORDER — HYDROMORPHONE HCL 1 MG/ML IJ SOLN
0.2500 mg | INTRAMUSCULAR | Status: DC | PRN
Start: 2023-08-22 — End: 2023-08-22

## 2023-08-22 MED ORDER — PROPOFOL 10 MG/ML IV BOLUS
INTRAVENOUS | Status: DC | PRN
  Administered 2023-08-22: 120 mg via INTRAVENOUS
  Administered 2023-08-22: 20 mg via INTRAVENOUS

## 2023-08-22 MED ORDER — SUGAMMADEX SODIUM 200 MG/2ML IV SOLN
INTRAVENOUS | Status: AC
  Filled 2023-08-22: qty 2

## 2023-08-22 MED ORDER — PROPOFOL 10 MG/ML IV BOLUS
INTRAVENOUS | Status: AC
  Filled 2023-08-22: qty 20

## 2023-08-22 MED ORDER — ONDANSETRON HCL 4 MG/2ML IJ SOLN
INTRAMUSCULAR | Status: DC | PRN
  Administered 2023-08-22: 4 mg via INTRAVENOUS

## 2023-08-22 MED ORDER — FENTANYL CITRATE (PF) 250 MCG/5ML IJ SOLN
INTRAMUSCULAR | Status: DC | PRN
  Administered 2023-08-22: 50 ug via INTRAVENOUS
  Administered 2023-08-22: 25 ug via INTRAVENOUS
  Administered 2023-08-22 (×2): 50 ug via INTRAVENOUS
  Administered 2023-08-22: 25 ug via INTRAVENOUS
  Administered 2023-08-22: 50 ug via INTRAVENOUS

## 2023-08-22 MED ORDER — ACETAMINOPHEN 10 MG/ML IV SOLN
1000.0000 mg | Freq: Four times a day (QID) | INTRAVENOUS | Status: DC | PRN
  Administered 2023-08-22: 1000 mg via INTRAVENOUS
  Filled 2023-08-22: qty 100

## 2023-08-22 MED ORDER — LORAZEPAM 2 MG/ML IJ SOLN
1.0000 mg | Freq: Once | INTRAMUSCULAR | Status: AC | PRN
  Administered 2023-08-22: 1 mg via INTRAVENOUS
  Filled 2023-08-22: qty 1

## 2023-08-22 MED ORDER — ROCURONIUM BROMIDE 10 MG/ML (PF) SYRINGE
PREFILLED_SYRINGE | INTRAVENOUS | Status: AC
  Filled 2023-08-22: qty 10

## 2023-08-22 MED ORDER — DEXAMETHASONE SODIUM PHOSPHATE 10 MG/ML IJ SOLN
INTRAMUSCULAR | Status: DC | PRN
  Administered 2023-08-22: 5 mg via INTRAVENOUS

## 2023-08-22 MED ORDER — KETOROLAC TROMETHAMINE 15 MG/ML IJ SOLN
15.0000 mg | Freq: Once | INTRAMUSCULAR | Status: AC
  Administered 2023-08-22: 15 mg via INTRAVENOUS
  Filled 2023-08-22: qty 1

## 2023-08-22 MED ORDER — PHENYLEPHRINE 80 MCG/ML (10ML) SYRINGE FOR IV PUSH (FOR BLOOD PRESSURE SUPPORT)
PREFILLED_SYRINGE | INTRAVENOUS | Status: DC | PRN
  Administered 2023-08-22 (×3): 80 ug via INTRAVENOUS
  Administered 2023-08-22: 160 ug via INTRAVENOUS

## 2023-08-22 MED ORDER — FENTANYL CITRATE PF 50 MCG/ML IJ SOSY
25.0000 ug | PREFILLED_SYRINGE | INTRAMUSCULAR | Status: DC | PRN
  Administered 2023-08-22 – 2023-08-26 (×9): 50 ug via INTRAVENOUS
  Filled 2023-08-22 (×11): qty 1

## 2023-08-22 MED ORDER — ACETAMINOPHEN 10 MG/ML IV SOLN
1000.0000 mg | Freq: Four times a day (QID) | INTRAVENOUS | Status: AC | PRN
  Administered 2023-08-22 (×2): 1000 mg via INTRAVENOUS
  Filled 2023-08-22 (×2): qty 100

## 2023-08-22 MED ORDER — LACTATED RINGERS IV SOLN: INTRAVENOUS | Status: DC

## 2023-08-22 MED ORDER — ORAL CARE MOUTH RINSE: 15.0000 mL | Freq: Once | OROMUCOSAL | Status: AC

## 2023-08-22 MED ORDER — FENTANYL CITRATE (PF) 250 MCG/5ML IJ SOLN
INTRAMUSCULAR | Status: AC
  Filled 2023-08-22: qty 5

## 2023-08-22 MED ORDER — POTASSIUM ACETATE 2 MEQ/ML IV SOLN
INTRAVENOUS | Status: AC
  Filled 2023-08-22: qty 800.1

## 2023-08-22 MED ORDER — CHLORHEXIDINE GLUCONATE 0.12 % MT SOLN: 15.0000 mL | Freq: Once | OROMUCOSAL | Status: AC

## 2023-08-22 MED ORDER — POTASSIUM PHOSPHATES 15 MMOLE/5ML IV SOLN
15.0000 mmol | Freq: Once | INTRAVENOUS | Status: AC
  Administered 2023-08-22: 15 mmol via INTRAVENOUS
  Filled 2023-08-22: qty 5

## 2023-08-22 MED ORDER — SUGAMMADEX SODIUM 200 MG/2ML IV SOLN
INTRAVENOUS | Status: DC | PRN
  Administered 2023-08-22: 200 mg via INTRAVENOUS

## 2023-08-22 MED ORDER — PHENYLEPHRINE 80 MCG/ML (10ML) SYRINGE FOR IV PUSH (FOR BLOOD PRESSURE SUPPORT)
PREFILLED_SYRINGE | INTRAVENOUS | Status: AC
  Filled 2023-08-22: qty 10

## 2023-08-22 MED ORDER — CHLORHEXIDINE GLUCONATE 0.12 % MT SOLN
OROMUCOSAL | Status: AC
  Administered 2023-08-22: 15 mL via OROMUCOSAL
  Filled 2023-08-22: qty 15

## 2023-08-22 MED ORDER — ROCURONIUM BROMIDE 10 MG/ML (PF) SYRINGE
PREFILLED_SYRINGE | INTRAVENOUS | Status: DC | PRN
  Administered 2023-08-22: 60 mg via INTRAVENOUS

## 2023-08-22 MED ORDER — SUCCINYLCHOLINE CHLORIDE 200 MG/10ML IV SOSY
PREFILLED_SYRINGE | INTRAVENOUS | Status: AC
  Filled 2023-08-22: qty 10

## 2023-08-22 MED ORDER — ONDANSETRON HCL 4 MG/2ML IJ SOLN
INTRAMUSCULAR | Status: AC
  Filled 2023-08-22: qty 2

## 2023-08-22 MED ORDER — DEXAMETHASONE SODIUM PHOSPHATE 10 MG/ML IJ SOLN
INTRAMUSCULAR | Status: AC
Start: 2023-08-22 — End: ?
  Filled 2023-08-22: qty 1

## 2023-08-22 MED ORDER — DROPERIDOL 2.5 MG/ML IJ SOLN: 0.6250 mg | Freq: Once | INTRAMUSCULAR | Status: DC | PRN

## 2023-08-22 SURGICAL SUPPLY — 44 items
BAG COUNTER SPONGE SURGICOUNT (BAG) ×2 IMPLANT
BLADE CLIPPER SURG (BLADE) IMPLANT
BNDG GAUZE DERMACEA FLUFF 4 (GAUZE/BANDAGES/DRESSINGS) IMPLANT
CANISTER SUCT 3000ML PPV (MISCELLANEOUS) ×2 IMPLANT
COVER SURGICAL LIGHT HANDLE (MISCELLANEOUS) ×2 IMPLANT
DRAPE LAPAROSCOPIC ABDOMINAL (DRAPES) ×2 IMPLANT
DRAPE WARM FLUID 44X44 (DRAPES) ×2 IMPLANT
DRSG OPSITE POSTOP 4X10 (GAUZE/BANDAGES/DRESSINGS) IMPLANT
DRSG OPSITE POSTOP 4X8 (GAUZE/BANDAGES/DRESSINGS) IMPLANT
ELECT BLADE 6.5 EXT (BLADE) IMPLANT
ELECT CAUTERY BLADE 6.4 (BLADE) ×2 IMPLANT
ELECT REM PT RETURN 9FT ADLT (ELECTROSURGICAL) ×2 IMPLANT
ELECTRODE REM PT RTRN 9FT ADLT (ELECTROSURGICAL) ×2 IMPLANT
GAUZE PAD ABD 7.5X8 STRL (GAUZE/BANDAGES/DRESSINGS) IMPLANT
GAUZE SPONGE 4X4 12PLY STRL (GAUZE/BANDAGES/DRESSINGS) IMPLANT
GLOVE SURG SIGNA 7.5 PF LTX (GLOVE) ×2 IMPLANT
GOWN STRL REUS W/ TWL LRG LVL3 (GOWN DISPOSABLE) ×2 IMPLANT
GOWN STRL REUS W/ TWL XL LVL3 (GOWN DISPOSABLE) ×2 IMPLANT
HANDLE SUCTION POOLE (INSTRUMENTS) ×2 IMPLANT
KIT BASIN OR (CUSTOM PROCEDURE TRAY) ×2 IMPLANT
KIT TURNOVER KIT B (KITS) ×2 IMPLANT
LIGASURE IMPACT 36 18CM CVD LR (INSTRUMENTS) IMPLANT
NS IRRIG 1000ML POUR BTL (IV SOLUTION) ×4 IMPLANT
PACK GENERAL/GYN (CUSTOM PROCEDURE TRAY) ×2 IMPLANT
PAD ARMBOARD 7.5X6 YLW CONV (MISCELLANEOUS) ×2 IMPLANT
RELOAD PROXIMATE 75MM BLUE (ENDOMECHANICALS) ×2 IMPLANT
RELOAD STAPLE 75 3.8 BLU REG (ENDOMECHANICALS) IMPLANT
SPONGE DRAIN TRACH 4X4 STRL 2S (GAUZE/BANDAGES/DRESSINGS) IMPLANT
SPONGE T-LAP 18X18 ~~LOC~~+RFID (SPONGE) IMPLANT
STAPLER GUN LINEAR PROX 60 (STAPLE) IMPLANT
STAPLER PROXIMATE 75MM BLUE (STAPLE) IMPLANT
STAPLER VISISTAT 35W (STAPLE) ×2 IMPLANT
SUCTION POOLE HANDLE (INSTRUMENTS) ×2 IMPLANT
SUT PDS AB 1 TP1 96 (SUTURE) ×4 IMPLANT
SUT SILK 2 0 SH CR/8 (SUTURE) ×2 IMPLANT
SUT SILK 2 0 TIES 10X30 (SUTURE) ×2 IMPLANT
SUT SILK 3 0 SH CR/8 (SUTURE) ×2 IMPLANT
SUT SILK 3 0 TIES 10X30 (SUTURE) ×2 IMPLANT
SUT VIC AB 3-0 SH 18 (SUTURE) IMPLANT
TAPE CLOTH SURG 4X10 WHT LF (GAUZE/BANDAGES/DRESSINGS) IMPLANT
TOWEL GREEN STERILE (TOWEL DISPOSABLE) ×2 IMPLANT
TOWEL GREEN STERILE FF (TOWEL DISPOSABLE) ×2 IMPLANT
TRAY FOLEY MTR SLVR 16FR STAT (SET/KITS/TRAYS/PACK) IMPLANT
YANKAUER SUCT BULB TIP NO VENT (SUCTIONS) IMPLANT

## 2023-08-22 NOTE — Progress Notes (Signed)
 PHARMACY - TOTAL PARENTERAL NUTRITION CONSULT NOTE   Indication: Prolonged ileus  Patient Measurements: Weight: 97.8 kg (215 lb 9.8 oz) Body mass index is 30.94 kg/m. Usual Weight: 97.1kg on admission, 93kg in November 2024   Assessment:  Patient admitted on 2/12 for abdominal pain and was found to have a partial small bowel obstruction and suspected some aspect of viral enteritis with ileus. Hx of 2 prior small bowel resections. Patient had CLD and FLD ordered throughout this time but did not tolerate well since 2/14 due to abdominal distention. Ileus resolved on 2/16 and surgery signed off but patient remained to have minimal PO intake and abdominal pain. Surgery re-consulted 2/20 due to concern for pneumoperitoneum and suspected perforated bowel, patient underwent a ex-lap on 2/20 which found two areas of perforation in the distal jejunum, abdomen left open and in discontinuity after OR.   Pharmacy consulted to dose TPN.   Glucose / Insulin: hx of pre-diabetes, A1C 6.0% 3 months prior, no meds PTA. CBG <180 (required 12 units SSI since TPN start). Electrolytes: Na 133, K 3.8, phos 2.4, mag 2.2, CoCa 9.7  Renal: mild AKI, Scr: 1.28 (baseline ~ 1), BUN wnl Hepatic: ALP: 55, AST/ALT WNL, T-bili: 1.3, TG: 42, albumin: 2.4  Intake / Output; MIVF: UOP , NGT , drains . reglan 10 Q6H GI Imaging: 2/20 CT ab: pneumoperitoneum suspected perforation  2/20 KUB: concerning for SOB  2/19 CT ab: concerning for ileus, gas throughout small bowel not suspicious for pneumatosis  2/11: CT ab: favor SBO than ileus  GI Surgeries / Procedures:  2/20: ex-lap with small bowel repair, abdomen left open  2/22: closure of open abd  Central access: PICC 2/21  TPN start date: 2/21   Nutritional Goals: Goal TPN rate is 90 mL/hr (provides 120 g of protein and 2301 kcals per day)  RD Assessment: Estimated Needs Total Energy Estimated Needs: 2300-2600 Total Protein Estimated Needs: 120-135  grams Total Fluid Estimated Needs: >2 L/day  Current Nutrition:  NPO and TPN  Plan:  Increase TPN to 68mL/hr at 1800, providing 1534 kcal and 80g protein meeting approximately 67% of nutritional needs. Electrolytes in TPN: increase Na to 173mEq/L, increase K to 95mEq/L, Ca 28mEq/L, Mg 75mEq/L, and increase Phos to 12mmol/L. Cl:Ac 2:1 Kphos 15 mmol x 1 Add standard MVI and trace elements to TPN Continue Moderate q4h SSI and adjust as needed  Monitor TPN labs on Mon/Thurs, daily until stabilization.   Christoper Fabian, PharmD, BCPS Please see amion for complete clinical pharmacist phone list  08/22/2023,10:10 AM

## 2023-08-22 NOTE — Progress Notes (Signed)
 Patient ID: [name], male   DOB: Sep 15, 1945, 78 y.o.   MRN: [id number]   Pre Procedure note for inpatients:   [name] has been scheduled for Procedure(s): EXPLORATORY LAPAROTOMY WITH CLOSURE (N/A) BOWEL REANASTOMOSIS (N/A) today. The various methods of treatment have been discussed with the patient. After consideration of the risks, benefits and treatment options the patient has consented to the planned procedure.   The patient has been seen and labs reviewed. There are no changes in the patient's condition to prevent proceeding with the planned procedure today.  Recent labs:  Lab Results  Component Value Date   WBC 16.5 (H) 08/21/2023   HGB 12.6 (L) 08/21/2023   HCT 37.0 (L) 08/21/2023   PLT 390 08/21/2023   GLUCOSE 159 (H) 08/22/2023   CHOL 110 05/14/2023   TRIG 42 08/21/2023   HDL 44.20 05/14/2023   LDLCALC 48 05/14/2023   ALT 29 08/21/2023   AST 20 08/21/2023   NA 133 (L) 08/22/2023   K 3.8 08/22/2023   CL 95 (L) 08/22/2023   CREATININE 1.28 (H) 08/22/2023   BUN 21 08/22/2023   CO2 26 08/22/2023   TSH 0.83 05/14/2023   PSA 1.08 08/07/2014   HGBA1C 6.0 05/14/2023    Abigail Miyamoto, MD 08/22/2023 7:40 AM

## 2023-08-22 NOTE — Progress Notes (Signed)
 OT Cancellation Note  Patient Details Name: [name] MRN: [id number] DOB: 1945/07/02   Cancelled Treatment:    Reason Eval/Treat Not Completed: Patient at procedure or test/ unavailable. Pt to OR today for anastomosing small bowel back together.    Lindon Romp OT Acute Rehabilitation Services Office [phone]    Evette Georges 08/22/2023, 7:09 AM

## 2023-08-22 NOTE — Anesthesia Postprocedure Evaluation (Signed)
 Anesthesia Post Note  Patient: [name]  Procedure(s) Performed: EXPLORATORY LAPAROTOMY WITH CLOSURE OF OPEN ABDOMEN (Abdomen) BOWEL REANASTOMOSIS     Patient location during evaluation: PACU Anesthesia Type: General Level of consciousness: sedated and patient cooperative Pain management: pain level controlled Vital Signs Assessment: post-procedure vital signs reviewed and stable Respiratory status: spontaneous breathing Cardiovascular status: stable Anesthetic complications: no   No notable events documented.  Last Vitals:  Vitals:   08/22/23 0954 08/22/23 1026  BP: 122/87 115/88  Pulse: (!) 105 (!) 102  Resp: 18 12  Temp: 36.7 C   SpO2: 95% 96%    Last Pain:  Vitals:   08/22/23 1026  TempSrc:   PainSc: 3                  Lewie Loron

## 2023-08-22 NOTE — Consult Note (Signed)
 NAME:  [name], MRN:  [id number], DOB:  09/23/1945, LOS: 9 ADMISSION DATE:  08/12/2023, CONSULTATION DATE:  08/20/23 REFERRING MD:  Dr Bedelia Person, CHIEF COMPLAINT:  icu admission 2/2 open abdomen   History of Present Illness:  78 yo male admitted to hospital ~8 days ago after being seen by pcp on 2/12 due to diffuse abdominal pain. They advised him to seek care at ED after no outpt radiology appts for ct were available. At that time pt endorsed N/D but no f/c or emesis. Abdominal pain was reported to be periumbilical without radiation. Pt was having normal BM till Sunday 2/9, after then reports 5-6 bm per day with associated "dry heaves" but no frank emesis. Denied at that time any recent illnesses or sick contacts. At presentation he was admitted to [facility] with surgery consult and after evaluation felt 2/2 to have ileus and plan for conservative management.   During hospitalization pt was found to be norovirus positive and was continued with supportive care. He was able to tolerate oral diet as af 2/14. Pt's abdominal exam progressed per chart review req surgery to re-engage 2/15. Pt has been unable to tolerate soft diet only liquid since decline from 2/14. Gi was consulted 2/17 to provide recommendations, they had multiple recommendations, involving decreased narcotic use, increase movement and gi motility medications in order to stimulate bowel activity. 2/19 pt's abdominal pain increased, he was unable to tolerate clear liquids, he was made NPO and ct abdomen scan obtained which revealed diffusely dilated and fluid filled sb with distention of the colon as well. He was given a dose of relistor in an attempt to help however 2/20 pt had continued n/v and distention without bm and imaging revealed continued/if not worsening distension, concern for perforation with ? Pneumoperitoneum and he was taken to OR after initiation of abx.   Taken to OR today for exlap, small bowel resection required after finding of  pneumoperitoneum 2/2 sb perforation.   Pt was able to be extubated post operatively and at this time hemodynamically stable however surgery has requested monitoring in ICU for which ICU has been consulted.   Pertinent  Medical History  Htn Hyperlipidemia H/o sbo  Significant Hospital Events: Including procedures, antibiotic start and stop dates in addition to other pertinent events   Admitted 2/12 Surgery consulted 2./12 due to ? Sbo Gi consulted 2/17 due to persistent gi dysmotility OR with surgery 2/20, left open for further eval and admitted to ICU.  Small bowel perforation found in the jejunum. OR 2/22 reanastomosis and fascia closure.  Interim History / Subjective:  Looks great following surgery today with minimal pain.  Objective   Blood pressure 115/88, pulse (!) 102, temperature 98 F (36.7 C), temperature source Oral, resp. rate 12, height 5\' 10"  (1.778 m), weight 97.8 kg, SpO2 96%.        Intake/Output Summary (Last 24 hours) at 08/22/2023 1325 Last data filed at 08/22/2023 1232 Gross per 24 hour  Intake 1901.63 ml  Output 1810 ml  Net 91.63 ml   Filed Weights   08/21/23 0802 08/22/23 0703  Weight: 97.8 kg 97.8 kg    Examination: General: Awake alert pleasant. HENT: Nasogastric tube in place with minimal drainage. Lungs: Normal vesicular breath sounds throughout. Good chest excursion.  Cardiovascular: Sinus tachycardia, heart sounds normal.  Extremities warm well-perfused. Abdomen: Distended with minimal bowel sounds.  Incisional tenderness.  ABD dressing in place. Extremities: No edema. Neuro: Clear sensorium with no focal deficits. GU: Normal urine  output  Ancillary tests personally reviewed  Hyponatremia 133.  Creatinine 1.28  Assessment & Plan:  Acute abdominal pain Sbo ileus Sb perforation with pneumoperitoneum Hyponatremia AKI Chronic normocytic anemia Sepsis 2/2 above  Plan:  -Doing very well and appears appropriately resuscitated.  -  Decrease IV fluids. - Judicious pain control  - Continue TPN - Follow creatinine - Start gentle mobilization   Best Practice (right click and "Reselect all SmartList Selections" daily)   Diet/type: NPO - TPN DVT prophylaxis SCD Pressure ulcer(s): N/A GI prophylaxis: PPI Lines: N/A Foley:  Yes, and it is still needed Code Status:  full code Last date of multidisciplinary goals of care discussion [patient updated at the bedside]  Lynnell Catalan, MD [facility] ICU Physician [facility]  Pager: [phone] Or Epic Secure Chat After hours: [phone].  08/22/2023, 1:25 PM

## 2023-08-22 NOTE — Anesthesia Procedure Notes (Signed)
 Procedure Name: Intubation Date/Time: 08/22/2023 7:57 AM  Performed by: Audie Pinto, CRNAPre-anesthesia Checklist: Patient identified, Emergency Drugs available, Suction available and Patient being monitored Patient Re-evaluated:Patient Re-evaluated prior to induction Oxygen Delivery Method: Circle system utilized Preoxygenation: Pre-oxygenation with 100% oxygen Induction Type: IV induction, Rapid sequence and Cricoid Pressure applied Ventilation: Mask ventilation without difficulty Laryngoscope Size: Mac and 4 Grade View: Grade I Tube type: Oral Tube size: 7.5 mm Number of attempts: 1 Airway Equipment and Method: Stylet and Oral airway Placement Confirmation: ETT inserted through vocal cords under direct vision, positive ETCO2 and breath sounds checked- equal and bilateral Secured at: 23 cm Tube secured with: Tape Dental Injury: Teeth and Oropharynx as per pre-operative assessment

## 2023-08-22 NOTE — Transfer of Care (Signed)
 Immediate Anesthesia Transfer of Care Note  Patient: [name]  Procedure(s) Performed: EXPLORATORY LAPAROTOMY WITH CLOSURE OF OPEN ABDOMEN (Abdomen) BOWEL REANASTOMOSIS  Patient Location: PACU  Anesthesia Type:General  Level of Consciousness: drowsy and patient cooperative  Airway & Oxygen Therapy: Patient Spontanous Breathing and Patient connected to face mask oxygen  Post-op Assessment: Report given to RN and Post -op Vital signs reviewed and stable  Post vital signs: Reviewed and stable  Last Vitals:  Vitals Value Taken Time  BP 121/89 08/22/23 0906  Temp    Pulse 102 08/22/23 0910  Resp 16 08/22/23 0910  SpO2 100 % 08/22/23 0910  Vitals shown include unfiled device data.  Last Pain:  Vitals:   08/22/23 0708  TempSrc: Oral  PainSc:       Patients Stated Pain Goal: 4 (08/20/23 2030)  Complications: No notable events documented.

## 2023-08-22 NOTE — Op Note (Signed)
 [name] 08/22/2023   Pre-op Diagnosis: OPEN ABDOMEN     Post-op Diagnosis: SAME  Procedure(s): EXPLORATORY LAPAROTOMY  SMALL BOWEL RESECTION SMALL BOWEL ANASTOMOSIS  Surgeon(s): Abigail Miyamoto, MD Violeta Gelinas, MD  Anesthesia: General  Staff:  Circulator: Babs Sciara, RN Scrub Person: Carollee Leitz  Estimated Blood Loss: Minimal               Specimens: previous distal small bowel staple line  Indications: This is a 78 year old gentleman who initially presented with norovirus and had a prolonged ileus secondary to this.  He then developed spontaneous perforation of the small bowel and had to proceed to the operating room for an exploratory laparotomy.  He underwent a small bowel resection.  Because of continuing leaking at each staple line which each had to be resected further, the decision was made to leave the patient in discontinuity and place an ABThera VAC with return to the operating room for washout and possible reanastomosis of the bowel  Findings: The patient had mild fibrinous exudate throughout the abdomen.  The proximal staple line of the discontinuous small bowel was holding well.  There was a small leak at the distal portion so this was resected prior to then performing a small bowel reanastomosis.  No other pathology was identified  Procedure: The patient was brought to the operating room and identified the correct patient.  He was placed upon on the operating room table and general anesthesia was induced.  The outer portion of the wound VAC was disconnected and removed.  The most outer sponge was removed as well.  His abdomen and a drain site were then prepped and draped in usual sterile fashion.  We then removed the inner sponge and the remaining wound VAC.  We then slowly eviscerated the small bowel.  The small bowel appeared pink and viable but was still distended.  We found both of the stapled ends of the small bowel.  There was a small leak  at the distal portion at the mesenteric border.  Using a GIA 75 stapler we resected the staple line.  The new staple line appeared to hold well.  We then reapproximated the proximal small bowel and distal small bowel in a side-to-side fashion with silk sutures.  We then performed 2 enterotomies with the cautery and then created a side-to-side anastomosis with a GIA 75 stapler.  The opening was then closed with a TX 60 stapler and reinforced with 2-0 silk sutures.  I used a second layer to imbricate the staple line and then reinforced the rest the staple line with 3-0 silk sutures.  We then were able to milk contents through the anastomosis and saw no evidence of leak.  We then closed the mesenteric defect with interrupted 2-0 silk sutures.  We then copiously irrigated the abdomen with several liters of normal saline.  There was omentum stuck to the abdominal wall previous mesh.  This was fairly friable so we elected to leave the rest of the omentum in place.  Hemostasis appeared to be achieved with a silk suture and the cautery.  We then were able to close the midline fascia with a running #1 looped PDS suture.  The skin was then left open and packed with wet-to-dry saline soaked Kerlix.  Dry gauze and ABDs were applied.  The drain was placed to bulb suction as before.  The patient tolerated the procedure well.  All the counts were correct at the end of the procedure.  The  patient was then extubated in the operating room and taken in a stable condition to the recovery room.          Abigail Miyamoto   Date: 08/22/2023  Time: 8:54 AM

## 2023-08-23 ENCOUNTER — Encounter (HOSPITAL_COMMUNITY): Payer: Self-pay | Admitting: Surgery

## 2023-08-23 DIAGNOSIS — K668 Other specified disorders of peritoneum: Secondary | ICD-10-CM | POA: Diagnosis not present

## 2023-08-23 DIAGNOSIS — A419 Sepsis, unspecified organism: Secondary | ICD-10-CM | POA: Diagnosis not present

## 2023-08-23 DIAGNOSIS — K56609 Unspecified intestinal obstruction, unspecified as to partial versus complete obstruction: Secondary | ICD-10-CM | POA: Diagnosis not present

## 2023-08-23 LAB — BASIC METABOLIC PANEL
Anion gap: 11 (ref 5–15)
BUN: 17 mg/dL (ref 8–23)
CO2: 25 mmol/L (ref 22–32)
Calcium: 8.1 mg/dL — ABNORMAL LOW (ref 8.9–10.3)
Chloride: 97 mmol/L — ABNORMAL LOW (ref 98–111)
Creatinine, Ser: 1.05 mg/dL (ref 0.61–1.24)
GFR, Estimated: >60 mL/min (ref >60)
Glucose, Bld: 129 mg/dL — ABNORMAL HIGH (ref 70–99)
Potassium: 4.1 mmol/L (ref 3.5–5.1)
Sodium: 133 mmol/L — ABNORMAL LOW (ref 135–145)

## 2023-08-23 LAB — GLUCOSE, CAPILLARY
Glucose-Capillary: 130 mg/dL — ABNORMAL HIGH (ref 70–99)
Glucose-Capillary: 131 mg/dL — ABNORMAL HIGH (ref 70–99)
Glucose-Capillary: 113 mg/dL — ABNORMAL HIGH (ref 70–99)
Glucose-Capillary: 114 mg/dL — ABNORMAL HIGH (ref 70–99)
Glucose-Capillary: 124 mg/dL — ABNORMAL HIGH (ref 70–99)

## 2023-08-23 LAB — CBC
HCT: 30.1 % — ABNORMAL LOW (ref 39.0–52.0)
Hemoglobin: 9.9 g/dL — ABNORMAL LOW (ref 13.0–17.0)
MCH: 27.9 pg (ref 26.0–34.0)
MCHC: 32.9 g/dL (ref 30.0–36.0)
MCV: 84.8 fL (ref 80.0–100.0)
Platelets: 415 10*3/uL — ABNORMAL HIGH (ref 150–400)
RBC: 3.55 MIL/uL — ABNORMAL LOW (ref 4.22–5.81)
RDW: 13 % (ref 11.5–15.5)
WBC: 17 10*3/uL — ABNORMAL HIGH (ref 4.0–10.5)
nRBC: 0 % (ref 0.0–0.2)

## 2023-08-23 LAB — PHOSPHORUS: Phosphorus: 2.7 mg/dL (ref 2.5–4.6)

## 2023-08-23 LAB — MAGNESIUM: Magnesium: 2.1 mg/dL (ref 1.7–2.4)

## 2023-08-23 MED ORDER — KETOROLAC TROMETHAMINE 15 MG/ML IJ SOLN
15.0000 mg | Freq: Three times a day (TID) | INTRAMUSCULAR | Status: AC | PRN
  Administered 2023-08-23 – 2023-08-26 (×6): 15 mg via INTRAVENOUS
  Filled 2023-08-23 (×7): qty 1

## 2023-08-23 MED ORDER — ENOXAPARIN SODIUM 40 MG/0.4ML IJ SOSY
40.0000 mg | PREFILLED_SYRINGE | INTRAMUSCULAR | Status: DC
  Administered 2023-08-23 – 2023-08-28 (×6): 40 mg via SUBCUTANEOUS
  Filled 2023-08-23 (×6): qty 0.4

## 2023-08-23 MED ORDER — LORAZEPAM 2 MG/ML IJ SOLN
1.0000 mg | Freq: Once | INTRAMUSCULAR | Status: AC | PRN
  Administered 2023-08-23: 1 mg via INTRAVENOUS
  Filled 2023-08-23: qty 1

## 2023-08-23 MED ORDER — TRAVASOL 10 % IV SOLN
INTRAVENOUS | Status: AC
  Filled 2023-08-23: qty 1200.1

## 2023-08-23 NOTE — Progress Notes (Signed)
 1 Day Post-Op    Subjective: Maybe passed a little gas ROS negative except as listed above. Objective: Vital signs in last 24 hours: Temp:  [97.1 F (36.2 C)-98.6 F (37 C)] 98.6 F (37 C) (02/23 0800) Pulse Rate:  [92-111] 99 (02/23 0500) Resp:  [12-24] 19 (02/23 0500) BP: (107-147)/(65-102) 134/85 (02/23 0500) SpO2:  [95 %-100 %] 97 % (02/23 0500) Weight:  [93.9 kg] 93.9 kg (02/23 0414) Last BM Date : 08/18/23  Intake/Output from previous day: 02/22 0701 - 02/23 0700 In: 3582.3 [I.V.:2972.9; IV Piggyback:609.4] Out: 1070 [Urine:550; Emesis/NG output:150; Drains:170; Blood:150] Intake/Output this shift: Total I/O In: -  Out: 200 [Urine:200]  General appearance: alert and cooperative GI: soft, drain SS, midline OK WTD  Lab Results: CBC  Recent Labs    08/21/23 0824 08/23/23 0514  WBC 16.5* 17.0*  HGB 12.6* 9.9*  HCT 37.0* 30.1*  PLT 390 415*   BMET Recent Labs    08/22/23 0412 08/23/23 0514  NA 133* 133*  K 3.8 4.1  CL 95* 97*  CO2 26 25  GLUCOSE 159* 129*  BUN 21 17  CREATININE 1.28* 1.05  CALCIUM 8.4* 8.1*   PT/INR No results for input(s): "LABPROT", "INR" in the last 72 hours. ABG No results for input(s): "PHART", "HCO3" in the last 72 hours.  Invalid input(s): "PCO2", "PO2"  Studies/Results: [place] EKG SITE RITE Result Date: 08/21/2023 If [facility] image not attached, placement could not be confirmed due to current cardiac rhythm.   Anti-infectives: Anti-infectives (From admission, onward)    Start     Dose/Rate Route Frequency Ordered Stop   08/21/23 2200  piperacillin-tazobactam (ZOSYN) IVPB 3.375 g  Status:  Discontinued       Placed in "Followed by" Linked Group   3.375 g 12.5 mL/hr over 240 Minutes Intravenous Every 8 hours 08/20/23 1526 08/21/23 0638   08/21/23 0700  piperacillin-tazobactam (ZOSYN) IVPB 3.375 g       Placed in "Followed by" Linked Group   3.375 g 12.5 mL/hr over 240 Minutes Intravenous Every 8 hours 08/21/23 0638      08/20/23 1615  piperacillin-tazobactam (ZOSYN) IVPB 3.375 g       Placed in "Followed by" Linked Group   3.375 g 100 mL/hr over 30 Minutes Intravenous  Once 08/20/23 1526 08/20/23 1614       Assessment/Plan: s/p Procedure(s): EXPLORATORY LAPAROTOMY WITH CLOSURE OF OPEN ABDOMEN BOWEL REANASTOMOSIS Pt s/p exp lap, small bowel resection for 2 separate areas of perforation, left in discontinuity, placement of abthera wound vac 2/21 by AL  S/P ex lap, SB anastamosis and closure 2/22 by DB  -NGT, ice chips, AROBF -ID Zosyn -PT/OT -D/W Dr. Denese Killings, OK to go to back to [facility] and transfer out of ICU    LOS: 10 days    Violeta Gelinas, MD, MPH, FACS Trauma & General Surgery Use [email or web address] to contact on call provider  2/23/2025Patient ID: [name], male   DOB: Jul 23, 1945, 78 y.o.   MRN: [id number]

## 2023-08-23 NOTE — Progress Notes (Signed)
 Pharmacy Antibiotic Note  [name] is a 78 y.o. male admitted on 08/12/2023 with abd infection , perforation  Pharmacy has been consulted for zosyn dosing.  Plan: Zosyn 3.375g IV q8 F/u LOT  Height: 5\' 10"  (177.8 cm) Weight: 93.9 kg (207 lb 0.2 oz) IBW/kg (Calculated) : 73  Temp (24hrs), Avg:98.3 F (36.8 C), Min:98 F (36.7 C), Max:98.6 F (37 C)  Recent Labs  Lab 08/18/23 0626 08/19/23 0623 08/20/23 0618 08/21/23 0457 08/21/23 0824 08/22/23 0412 08/23/23 0514  WBC 8.8 6.8 9.0  --  16.5*  --  17.0*  CREATININE 0.99 1.16 1.05 1.25*  --  1.28* 1.05    Estimated Creatinine Clearance: 70 mL/min (by C-G formula based on SCr of 1.05 mg/dL).    No Known Allergies  Antimicrobials this admission: 2/20 zosyn>>  Microbiology results: 2/12 GI panel>>norovirus  Christoper Fabian, PharmD, BCPS Please see amion for complete clinical pharmacist phone list 08/23/2023 11:58 AM

## 2023-08-23 NOTE — Progress Notes (Signed)
 PHARMACY - TOTAL PARENTERAL NUTRITION CONSULT NOTE   Indication: Prolonged ileus  Patient Measurements: Weight: 97.8 kg (215 lb 9.8 oz) Body mass index is 30.94 kg/m. Usual Weight: 97.1kg on admission, 93kg in November 2024   Assessment:  Patient admitted on 2/12 for abdominal pain and was found to have a partial small bowel obstruction and suspected some aspect of viral enteritis with ileus. Hx of 2 prior small bowel resections. Patient had CLD and FLD ordered throughout this time but did not tolerate well since 2/14 due to abdominal distention. Ileus resolved on 2/16 and surgery signed off but patient remained to have minimal PO intake and abdominal pain. Surgery re-consulted 2/20 due to concern for pneumoperitoneum and suspected perforated bowel, patient underwent a ex-lap on 2/20 which found two areas of perforation in the distal jejunum, abdomen left open and in discontinuity after OR.   Pharmacy consulted to dose TPN.   Glucose / Insulin: hx of pre-diabetes, A1C 6.0% 3 months prior, no meds PTA. CBG <180 (required 13 units SSI past 24h). Electrolytes: Na 133, CoCa 9.4, other lytes wnl Renal: Scr: 1.05 (baseline ~ 1), BUN wnl Hepatic: ALP: 55, AST/ALT WNL, T-bili: 1.3, TG: 42, albumin: 2.4  Intake / Output; MIVF: UOP , NGT , drains 40ml. reglan 10 Q6H. LR at 68mll/hr ends today at 1100. GI Imaging: 2/20 CT ab: pneumoperitoneum suspected perforation  2/20 KUB: concerning for SOB  2/19 CT ab: concerning for ileus, gas throughout small bowel not suspicious for pneumatosis  2/11: CT ab: favor SBO than ileus  GI Surgeries / Procedures:  2/20: ex-lap with small bowel repair, abdomen left open  2/22: closure of open abd  Central access: PICC 2/21  TPN start date: 2/21   Nutritional Goals: Goal TPN rate is 90 mL/hr (provides 120 g of protein and 2301 kcals per day)  RD Assessment: Estimated Needs Total Energy Estimated Needs: 2300-2600 Total Protein Estimated Needs:  120-135 grams Total Fluid Estimated Needs: >2 L/day  Current Nutrition:  NPO and TPN  Plan:  Increase TPN to goal rate of 41mL/hr at 1800, providing 2301 kcal and 120g protein meeting approximately 100% of nutritional needs. Electrolytes in TPN: increase Na to max 126mEq/L, K 71mEq/L, increase Ca to 3mEq/L, Mg 60mEq/L, and Phos 34mmol/L. Cl:Ac 2:1 Add standard MVI and trace elements to TPN Continue Moderate q4h SSI and adjust as needed  Monitor TPN labs on Mon/Thurs, daily until stabilization.   Christoper Fabian, PharmD, BCPS Please see amion for complete clinical pharmacist phone list  08/23/2023,7:50 AM

## 2023-08-23 NOTE — Progress Notes (Signed)
 NAME:  [name], MRN:  [id number], DOB:  12/21/1945, LOS: 10 ADMISSION DATE:  08/12/2023, CONSULTATION DATE:  08/20/23 REFERRING MD:  Dr Bedelia Person, CHIEF COMPLAINT:  icu admission 2/2 open abdomen   History of Present Illness:  78 yo male admitted to hospital ~8 days ago after being seen by pcp on 2/12 due to diffuse abdominal pain. They advised him to seek care at ED after no outpt radiology appts for ct were available. At that time pt endorsed N/D but no f/c or emesis. Abdominal pain was reported to be periumbilical without radiation. Pt was having normal BM till Sunday 2/9, after then reports 5-6 bm per day with associated "dry heaves" but no frank emesis. Denied at that time any recent illnesses or sick contacts. At presentation he was admitted to [facility] with surgery consult and after evaluation felt 2/2 to have ileus and plan for conservative management.   During hospitalization pt was found to be norovirus positive and was continued with supportive care. He was able to tolerate oral diet as af 2/14. Pt's abdominal exam progressed per chart review req surgery to re-engage 2/15. Pt has been unable to tolerate soft diet only liquid since decline from 2/14. Gi was consulted 2/17 to provide recommendations, they had multiple recommendations, involving decreased narcotic use, increase movement and gi motility medications in order to stimulate bowel activity. 2/19 pt's abdominal pain increased, he was unable to tolerate clear liquids, he was made NPO and ct abdomen scan obtained which revealed diffusely dilated and fluid filled sb with distention of the colon as well. He was given a dose of relistor in an attempt to help however 2/20 pt had continued n/v and distention without bm and imaging revealed continued/if not worsening distension, concern for perforation with ? Pneumoperitoneum and he was taken to OR after initiation of abx.   Taken to OR today for exlap, small bowel resection required after finding  of pneumoperitoneum 2/2 sb perforation.   Pt was able to be extubated post operatively and at this time hemodynamically stable however surgery has requested monitoring in ICU for which ICU has been consulted.   Pertinent  Medical History  Htn Hyperlipidemia H/o sbo  Significant Hospital Events: Including procedures, antibiotic start and stop dates in addition to other pertinent events   Admitted 2/12 Surgery consulted 2./12 due to ? Sbo Gi consulted 2/17 due to persistent gi dysmotility OR with surgery 2/20, left open for further eval and admitted to ICU.  Small bowel perforation found in the jejunum. OR 2/22 reanastomosis and fascia closure.  Interim History / Subjective:  Still looks great following surgery today with minimal pain.  Objective   Blood pressure 132/82, pulse 98, temperature 98.6 F (37 C), temperature source Oral, resp. rate (!) 22, height 5\' 10"  (1.778 m), weight 93.9 kg, SpO2 97%.        Intake/Output Summary (Last 24 hours) at 08/23/2023 1157 Last data filed at 08/23/2023 1120 Gross per 24 hour  Intake 2814.79 ml  Output 1160 ml  Net 1654.79 ml   Filed Weights   08/21/23 0802 08/22/23 0703 08/23/23 0414  Weight: 97.8 kg 97.8 kg 93.9 kg    Examination: General: Awake alert pleasant. HENT: Nasogastric tube in place with moderate drainage Lungs: Normal vesicular breath sounds throughout. Good chest excursion.  Cardiovascular: Sinus tachycardia improving, heart sounds normal.  Extremities warm well-perfused. Abdomen: Distended with minimal bowel sounds.  Incisional tenderness.  ABD dressing in place. Extremities: No edema. Neuro: Clear sensorium with  no focal deficits. GU: Normal urine output  Ancillary tests personally reviewed  Hyponatremia 133.  Creatinine 1.05 Leukocytosis 17.0, postoperative hemoglobin 9.9 Assessment & Plan:  Acute abdominal pain Sbo ileus Sb perforation with pneumoperitoneum Status post small bowel resection for perforation  with reanastomosis. AKI-resolved Chronic normocytic anemia Sepsis 2/2 above  Plan:  -Doing very well and appears appropriately resuscitated.  - Decrease IV fluids. - Judicious pain control.  Had good response to ketorolac.  Continue to follow renal function. - Continue TPN, duration as per general surgery. -Mobilization with physiotherapy -Ready for transfer to PCU.  Best Practice (right click and "Reselect all SmartList Selections" daily)   Diet/type: NPO - TPN DVT prophylaxis SCD-Lovenox Pressure ulcer(s): N/A GI prophylaxis: PPI Lines: N/A Foley:  Yes, and it is still needed Code Status:  full code Last date of multidisciplinary goals of care discussion [patient updated at the bedside]  Lynnell Catalan, MD [facility] ICU Physician [facility]  Pager: [phone] Or Epic Secure Chat After hours: [phone].  08/23/2023, 11:57 AM

## 2023-08-23 NOTE — Progress Notes (Signed)
 eLink Physician-Brief Progress Note Patient Name: [name] DOB: 03/11/1946 MRN: [id number]   Date of Service  08/23/2023  HPI/Events of Note  pt received ativan 1mg  IV for anxiety, calmed down with reported good nights sleep, pt is requesting this again.on TPN. NPO. On room air per RN discussion. Received 1 mg IV ativan last night also.   Sbo ileus Sb perforation with pneumoperitoneum Status post small bowel resection for perforation with reanastomosis   eICU Interventions  Ativan ordered     Intervention Category Minor Interventions: Agitation / anxiety - evaluation and management  Ranee Gosselin 08/23/2023, 8:49 PM

## 2023-08-24 DIAGNOSIS — K567 Ileus, unspecified: Secondary | ICD-10-CM | POA: Diagnosis not present

## 2023-08-24 LAB — PHOSPHORUS: Phosphorus: 2.6 mg/dL (ref 2.5–4.6)

## 2023-08-24 LAB — COMPREHENSIVE METABOLIC PANEL
ALT: 20 U/L (ref 0–44)
AST: 19 U/L (ref 15–41)
Albumin: <1.5 g/dL — ABNORMAL LOW (ref 3.5–5.0)
Alkaline Phosphatase: 44 U/L (ref 38–126)
Anion gap: 6 (ref 5–15)
BUN: 19 mg/dL (ref 8–23)
CO2: 24 mmol/L (ref 22–32)
Calcium: 7.6 mg/dL — ABNORMAL LOW (ref 8.9–10.3)
Chloride: 107 mmol/L (ref 98–111)
Creatinine, Ser: 0.96 mg/dL (ref 0.61–1.24)
GFR, Estimated: >60 mL/min (ref >60)
Glucose, Bld: 121 mg/dL — ABNORMAL HIGH (ref 70–99)
Potassium: 3.7 mmol/L (ref 3.5–5.1)
Sodium: 137 mmol/L (ref 135–145)
Total Bilirubin: 0.3 mg/dL (ref 0.0–1.2)
Total Protein: 4.6 g/dL — ABNORMAL LOW (ref 6.5–8.1)

## 2023-08-24 LAB — CBC WITH DIFFERENTIAL/PLATELET
Abs Immature Granulocytes: 0 10*3/uL (ref 0.00–0.07)
Basophils Absolute: 0 10*3/uL (ref 0.0–0.1)
Basophils Relative: 0 %
Eosinophils Absolute: 0.3 10*3/uL (ref 0.0–0.5)
Eosinophils Relative: 3 %
HCT: 25 % — ABNORMAL LOW (ref 39.0–52.0)
Hemoglobin: 8.2 g/dL — ABNORMAL LOW (ref 13.0–17.0)
Lymphocytes Relative: 4 %
Lymphs Abs: 0.4 10*3/uL — ABNORMAL LOW (ref 0.7–4.0)
MCH: 28.5 pg (ref 26.0–34.0)
MCHC: 32.8 g/dL (ref 30.0–36.0)
MCV: 86.8 fL (ref 80.0–100.0)
Monocytes Absolute: 0.6 10*3/uL (ref 0.1–1.0)
Monocytes Relative: 5 %
Neutro Abs: 9.9 10*3/uL — ABNORMAL HIGH (ref 1.7–7.7)
Neutrophils Relative %: 88 %
Platelets: 373 10*3/uL (ref 150–400)
RBC: 2.88 MIL/uL — ABNORMAL LOW (ref 4.22–5.81)
RDW: 13.3 % (ref 11.5–15.5)
WBC: 11.2 10*3/uL — ABNORMAL HIGH (ref 4.0–10.5)
nRBC: 0 % (ref 0.0–0.2)
nRBC: 0 /100{WBCs}

## 2023-08-24 LAB — MAGNESIUM: Magnesium: 1.9 mg/dL (ref 1.7–2.4)

## 2023-08-24 LAB — GLUCOSE, CAPILLARY
Glucose-Capillary: 138 mg/dL — ABNORMAL HIGH (ref 70–99)
Glucose-Capillary: 119 mg/dL — ABNORMAL HIGH (ref 70–99)
Glucose-Capillary: 119 mg/dL — ABNORMAL HIGH (ref 70–99)

## 2023-08-24 LAB — TRIGLYCERIDES: Triglycerides: 93 mg/dL (ref <150)

## 2023-08-24 LAB — SURGICAL PATHOLOGY

## 2023-08-24 MED ORDER — POTASSIUM CHLORIDE 10 MEQ/100ML IV SOLN
10.0000 meq | INTRAVENOUS | Status: AC
  Administered 2023-08-24 (×2): 10 meq via INTRAVENOUS
  Filled 2023-08-24: qty 100

## 2023-08-24 MED ORDER — POTASSIUM ACETATE 2 MEQ/ML IV SOLN
INTRAVENOUS | Status: AC
  Filled 2023-08-24: qty 1200.1

## 2023-08-24 MED ORDER — LORAZEPAM 2 MG/ML IJ SOLN
1.0000 mg | Freq: Once | INTRAMUSCULAR | Status: AC
  Administered 2023-08-25: 1 mg via INTRAVENOUS
  Filled 2023-08-24: qty 1

## 2023-08-24 MED ORDER — HYDRALAZINE HCL 20 MG/ML IJ SOLN: 10.0000 mg | Freq: Four times a day (QID) | INTRAMUSCULAR | Status: DC | PRN

## 2023-08-24 MED ORDER — METOPROLOL TARTRATE 5 MG/5ML IV SOLN: 5.0000 mg | Freq: Three times a day (TID) | INTRAVENOUS | Status: DC | PRN

## 2023-08-24 MED ORDER — FUROSEMIDE 10 MG/ML IJ SOLN
20.0000 mg | Freq: Once | INTRAMUSCULAR | Status: AC
  Administered 2023-08-24: 20 mg via INTRAVENOUS
  Filled 2023-08-24: qty 2

## 2023-08-24 NOTE — Progress Notes (Addendum)
 PHARMACY - TOTAL PARENTERAL NUTRITION CONSULT NOTE   Indication: perforated bowel, SBO, Prolonged ileus  Patient Measurements: Weight: 97.8 kg (215 lb 9.8 oz) Body mass index is 30.94 kg/m. Usual Weight: 97.1kg on admission, 93kg in November 2024   Assessment:  Patient admitted on 2/12 for abdominal pain and was found to have a partial small bowel obstruction and suspected some aspect of viral enteritis with ileus. Hx of 2 prior small bowel resections. Patient had CLD and FLD ordered throughout this time but did not tolerate well since 2/14 due to abdominal distention. Ileus resolved on 2/16 and surgery signed off but patient remained to have minimal PO intake and abdominal pain. Surgery re-consulted 2/20 due to concern for pneumoperitoneum and suspected perforated bowel, patient underwent a ex-lap on 2/20 which found two areas of perforation in the distal jejunum, abdomen left open and in discontinuity after OR.   Pharmacy consulted to dose TPN.   Glucose / Insulin: hx of pre-diabetes, A1C 6.0% 3 months prior, no meds PTA. CBG <140 (used 6 units SSI past 24h). Electrolytes:  K 3.7 down, CoCa 9.6, mg 1.9 down, other lytes wnl Renal: Scr: 1.05 (baseline ~ 1), BUN wnl Hepatic: ALP/AST/ALT/ tbili WNL, TG: 93, albumin <1.5 Intake / Output; MIVF:. reglan 10 Q6H;  UOP 0.6 ml/kg/hr, NGT , drains 25ml GI Imaging: 2/20 CT ab: pneumoperitoneum suspected perforation and SBO  2/20 KUB: concerning for SOB  2/19 CT ab: concerning for ileus, gas throughout small bowel not suspicious for pneumatosis  2/11: CT ab: favor SBO than ileus  GI Surgeries / Procedures:  2/22: closure of open abd 2/20: ex-lap with small bowel repair, abdomen left open    Central access: PICC 2/21  TPN start date: 2/21   Nutritional Goals: Goal TPN rate is 90 mL/hr (provides 120 g of protein and 2301 kcals per day)  RD Assessment: Estimated Needs Total Energy Estimated Needs: 2300-2600 Total Protein Estimated  Needs: 120-135 grams Total Fluid Estimated Needs: >2 L/day  Current Nutrition:  NPO and TPN  Plan:  Continue TPN at goal rate of 54mL/hr at 1800, providing 2301 kcal and 120g protein meeting approximately 100% of nutritional needs. Electrolytes in TPN: Na 150 mEq/L (max), increase K 30 mEq/L, Ca 3 mEq/L, increase Mg 6 mEq/L, increase Phos 13 mmol/L. Cl:Ac 2:1 Add standard MVI and trace elements to TPN Stop Moderate q4h SSI  Monitor TPN labs on 2/26 then Mon/Thurs, and PRN  Alphia Moh, PharmD, BCPS, [facility] Clinical Pharmacist  Please check AMION for all [facility] phone numbers After 10:00 PM, call Main Pharmacy [phone]

## 2023-08-24 NOTE — Progress Notes (Signed)
 Physical Therapy Treatment Patient Details Name: [name] MRN: [id number] DOB: [birth year] Today's Date: 08/24/2023   History of Present Illness Pt is a 78 y/o M presenting to ED On 2/12 wtih abdominal pain, admitted for SBO vs enteritis, GI +norovirus. S/p ex lap, SB resection, wound vac placement on 2/20. Ex-lap with small bowel anastamosis and closure on 2/22. PMH includes HTN, HLD, SBO, back pain, prediabetes, osteoporosis, L TSA, R reverse shoulder arthroplasty, R TKA.    PT Comments  Pt tolerates treatment well, ambulating for increased distances at this time. Pt will benefit from frequent mobilization in an effort to improve endurance and to restore independence. PT anticipates the pt will progress well, updates recommendations to no post-acute PT services.    If plan is discharge home, recommend the following: A little help with walking and/or transfers;A little help with bathing/dressing/bathroom   Can travel by private vehicle        Equipment Recommendations  None recommended by PT    Recommendations for Other Services       Precautions / Restrictions Precautions Precautions: Fall Recall of Precautions/Restrictions: Intact Precaution/Restrictions Comments: abd wound, L JP drain, NGT Restrictions Weight Bearing Restrictions Per Provider Order: No     Mobility  Bed Mobility                    Transfers Overall transfer level: Needs assistance Equipment used: Rolling walker (2 wheels) Transfers: Sit to/from Stand Sit to Stand: Contact guard assist                Ambulation/Gait Ambulation/Gait assistance: Supervision Gait Distance (Feet): 400 Feet Assistive device: Rolling walker (2 wheels) Gait Pattern/deviations: Step-through pattern Gait velocity: functional Gait velocity interpretation: 1.31 - 2.62 ft/sec, indicative of limited community ambulator   General Gait Details: steady step-through gait   Stairs             Wheelchair  Mobility     Tilt Bed    Modified Rankin (Stroke Patients Only)       Balance Overall balance assessment: Needs assistance Sitting-balance support: Feet supported, No upper extremity supported Sitting balance-Leahy Scale: Good     Standing balance support: Single extremity supported, Reliant on assistive device for balance Standing balance-Leahy Scale: Poor                              Communication Communication Communication: No apparent difficulties  Cognition Arousal: Alert Behavior During Therapy: WFL for tasks assessed/performed   PT - Cognitive impairments: No apparent impairments                         Following commands: Intact      Cueing Cueing Techniques: Verbal cues  Exercises      General Comments General comments (skin integrity, edema, etc.): VSS on RA, HR peaks at 105      Pertinent Vitals/Pain Pain Assessment Pain Assessment: Faces Faces Pain Scale: Hurts a little bit Pain Location: abdomen Pain Descriptors / Indicators: Grimacing Pain Intervention(s): Monitored during session    Home Living Family/patient expects to be discharged to:: Private residence Living Arrangements: Spouse/significant other Available Help at Discharge: Family;Available 24 hours/day Type of Home: House Home Access: Stairs to enter   Entrance Stairs-Number of Steps: 2 Alternate Level Stairs-Number of Steps: 14 Home Layout: Two level;Bed/bath upstairs Home Equipment: Rolling Walker (2 wheels);Cane - single point;BSC/3in1;Shower seat  Prior Function            PT Goals (current goals can now be found in the care plan section) Acute Rehab PT Goals Patient Stated Goal: to return to independence Progress towards PT goals: Progressing toward goals    Frequency    Min 1X/week      PT Plan      Co-evaluation              AM-PAC PT "6 Clicks" Mobility   Outcome Measure  Help needed turning from your back to your side  while in a flat bed without using bedrails?: A Little Help needed moving from lying on your back to sitting on the side of a flat bed without using bedrails?: A Little Help needed moving to and from a bed to a chair (including a wheelchair)?: A Little Help needed standing up from a chair using your arms (e.g., wheelchair or bedside chair)?: A Little Help needed to walk in hospital room?: A Little Help needed climbing 3-5 steps with a railing? : A Little 6 Click Score: 18    End of Session   Activity Tolerance: Patient tolerated treatment well Patient left: in chair;with call bell/phone within reach Nurse Communication: Mobility status PT Visit Diagnosis: Other abnormalities of gait and mobility (R26.89);Muscle weakness (generalized) (M62.81)     Time: 1610-9604 PT Time Calculation (min) (ACUTE ONLY): 24 min  Charges:    $Gait Training: 8-22 mins $Therapeutic Activity: 8-22 mins PT General Charges $$ ACUTE PT VISIT: 1 Visit                     Arlyss Gandy, PT, DPT Acute Rehabilitation Office [phone]    Arlyss Gandy 08/24/2023, 4:37 PM

## 2023-08-24 NOTE — Progress Notes (Addendum)
 2 Days Post-Op    Subjective: Passed more flatus and had a small BM. Mild nausea last night but none this am. Abdomen is sore post op but pain stable from yesterday. Intermittent cough. He is eager to ambulate today  Objective: Vital signs in last 24 hours: Temp:  [97.8 F (36.6 C)-99.6 F (37.6 C)] 99.6 F (37.6 C) (02/24 0812) Pulse Rate:  [87-101] 91 (02/24 0812) Resp:  [15-25] 17 (02/24 0812) BP: (124-137)/(66-90) 125/90 (02/24 0812) SpO2:  [91 %-99 %] 98 % (02/24 0812) Last BM Date : 08/24/23  Intake/Output from previous day: 02/23 0701 - 02/24 0700 In: 1852.8 [I.V.:1711.5; IV Piggyback:141.3] Out: 1750 [Urine:1325; Emesis/NG output:400; Drains:25] Intake/Output this shift: No intake/output data recorded.  General appearance: alert and cooperative GI: soft, NGT with bilious drainage. drain serous, midline with dressing cdi - dressing change by me this am with wound bed intact and healthy subcu fat without purulent drainage    Lab Results: CBC  Recent Labs    08/23/23 0514 08/24/23 0343  WBC 17.0* 11.2*  HGB 9.9* 8.2*  HCT 30.1* 25.0*  PLT 415* 373   BMET Recent Labs    08/23/23 0514 08/24/23 0343  NA 133* 137  K 4.1 3.7  CL 97* 107  CO2 25 24  GLUCOSE 129* 121*  BUN 17 19  CREATININE 1.05 0.96  CALCIUM 8.1* 7.6*   PT/INR No results for input(s): "LABPROT", "INR" in the last 72 hours. ABG No results for input(s): "PHART", "HCO3" in the last 72 hours.  Invalid input(s): "PCO2", "PO2"  Studies/Results: No results found.   Anti-infectives: Anti-infectives (From admission, onward)    Start     Dose/Rate Route Frequency Ordered Stop   08/21/23 2200  piperacillin-tazobactam (ZOSYN) IVPB 3.375 g  Status:  Discontinued       Placed in "Followed by" Linked Group   3.375 g 12.5 mL/hr over 240 Minutes Intravenous Every 8 hours 08/20/23 1526 08/21/23 0638   08/21/23 0700  piperacillin-tazobactam (ZOSYN) IVPB 3.375 g       Placed in "Followed by"  Linked Group   3.375 g 12.5 mL/hr over 240 Minutes Intravenous Every 8 hours 08/21/23 0638     08/20/23 1615  piperacillin-tazobactam (ZOSYN) IVPB 3.375 g       Placed in "Followed by" Linked Group   3.375 g 100 mL/hr over 30 Minutes Intravenous  Once 08/20/23 1526 08/20/23 1614       Assessment/Plan: Small bowel perforation in setting of ileus related to norovirus s/p exp lap, small bowel resection for 2 separate areas of perforation, left in discontinuity, placement of abthera wound vac 2/20 by AL S/P ex lap, SB anastamosis and closure 2/22 by DB POD 4/2 - having bowel function. NGT 400 ml bile over last 24 h. Clamp NGT and allow clears. If tolerates then NGT out this afternoon. Resume LIWS for worsening n/v/abdominal pain or distension - continue IV Abx - continue TNA - continue WTD dressing changes - discuss possible vac placement with MD -PT/OT  FEN: clamp NGT. CLD ID: [email or web address] 2/20 VTE: lovenox    LOS: 11 days    Eric Form, [facility] 08/24/2023, 10:56 AM Please see Amion for pager number during day hours 7:00am-4:30pm   2/24/2025Patient ID: [name], male   DOB: Jul 29, 1945, 78 y.o.   MRN: [id number]

## 2023-08-24 NOTE — Progress Notes (Signed)
 PROGRESS NOTE                                                                                                                                                                                                             Patient Demographics:    [name], is a 78 y.o. male, DOB - 1945-08-21, XLK:[id number]  Outpatient Primary MD for the patient is Bradd Canary, MD    LOS - 11  Admit date - 08/12/2023    Chief Complaint  Patient presents with   Abdominal Pain       Brief Narrative (HPI from H&P)   78 yo male with past medical history of hypertension, dyslipidemia, small bowel obstruction who was admitted to hospital on 08/12/2023 due to norovirus gastroenteritis complicated by small bowel obstruction, failed conservative treatment, was seen by general surgery, failed conservative management, developed pneumoperitoneum due to small bowel perforation requiring surgical intervention on 08/20/2023, he was seen by PCCM, transferred to my service on 08/24/2023 on day 11 of hospital stay still has NG tube, abdominal JP drain, PICC line with TNA.   Significant Hospital Events:  Including procedures   Admitted 2/12 Surgery consulted 2./12 due to ? Sbo Gi consulted 2/17 due to persistent gi dysmotility OR with surgery 2/20, left open for further eval and admitted to ICU.  Small bowel perforation found in the jejunum. OR 2/22 reanastomosis and fascia closure.   Subjective:    [name] today has, No headache, No chest pain, No abdominal pain - No Nausea, No new weakness tingling or numbness, no SOB   Assessment  & Plan :   Small bowel obstruction complicated with small bowel perforation, pneumoperitoneum. Seen by GI, general surgery and PCCM, underwent surgical intervention on 08/20/2023, reanastomosis on 08/22/2023, he is still on IV Zosyn, still has NG tube, PICC line with TNA, some return and bowel function.  Encourage  activity, defer management of this problem to general surgery.  Hypretension.  As needed IV hydralazine and Lopressor.  Dyslipidemia and BPH.  Resume statin and alpha-blocker once he is taking oral medications and diet reliably.        Condition - Fair  Family Communication  :  None  Code Status :  Full  Consults  :  CCS, PCCM  PUD Prophylaxis : PPI   Procedures  :  Disposition Plan  :    Status is: Inpatient  DVT Prophylaxis  :    enoxaparin (LOVENOX) injection 40 mg Start: 08/23/23 1300  Lab Results  Component Value Date   PLT 373 08/24/2023    Diet :  Diet Order             Diet NPO time specified Except for: Ice Chips  Diet effective now                    Inpatient Medications  Scheduled Meds:  Chlorhexidine Gluconate Cloth  6 each Topical Daily   enoxaparin (LOVENOX) injection  40 mg Subcutaneous Q24H   metoCLOPramide (REGLAN) injection  10 mg Intravenous Q6H   pantoprazole (PROTONIX) IV  40 mg Intravenous Q24H   sodium chloride flush  10-40 mL Intracatheter Q12H   Continuous Infusions:  piperacillin-tazobactam (ZOSYN)  IV 3.375 g (08/24/23 1610)   TPN ADULT (ION) 90 mL/hr at 08/23/23 1735   TPN ADULT (ION)     PRN Meds:.fentaNYL (SUBLIMAZE) injection, ketorolac, ondansetron (ZOFRAN) IV, mouth rinse, phenol, sodium chloride flush    Objective:   Vitals:   08/24/23 0400 08/24/23 0500 08/24/23 0700 08/24/23 0812  BP: 133/80   (!) 125/90  Pulse: 87 87 92 91  Resp: 20 (!) 22 (!) 21 17  Temp: 98 F (36.7 C)   99.6 F (37.6 C)  TempSrc: Oral   Oral  SpO2: 98% 99% 98% 98%  Weight:      Height:        Wt Readings from Last 3 Encounters:  08/23/23 93.9 kg  08/11/23 97.1 kg  05/14/23 93.9 kg     Intake/Output Summary (Last 24 hours) at 08/24/2023 1052 Last data filed at 08/24/2023 0550 Gross per 24 hour  Intake 1365.94 ml  Output 1550 ml  Net -184.06 ml     Physical Exam  Awake Alert, No new F.N deficits, right  arm PICC line, NG tube, abdominal JP drain [place].AT,PERRAL Supple Neck, No JVD,   Symmetrical Chest wall movement, Good air movement bilaterally, CTAB RRR,No Gallops,Rubs or new Murmurs,  +ve B.Sounds, Abd Soft, No tenderness,   No Cyanosis, Clubbing or edema      Data Review:    Recent Labs  Lab 08/19/23 0623 08/20/23 0618 08/21/23 0824 08/23/23 0514 08/24/23 0343  WBC 6.8 9.0 16.5* 17.0* 11.2*  HGB 11.1* 11.8* 12.6* 9.9* 8.2*  HCT 31.9* 33.8* 37.0* 30.1* 25.0*  PLT 307 359 390 415* 373  MCV 82.9 81.6 81.9 84.8 86.8  MCH 28.8 28.5 27.9 27.9 28.5  MCHC 34.8 34.9 34.1 32.9 32.8  RDW 12.7 12.7 13.1 13.0 13.3  LYMPHSABS 1.4 2.0 0.6*  --  0.4*  MONOABS 0.8 0.7 0.6  --  0.6  EOSABS 0.1 0.0 0.0  --  0.3  BASOSABS 0.0 0.0 0.0  --  0.0    Recent Labs  Lab 08/19/23 0623 08/20/23 0618 08/21/23 0457 08/22/23 0412 08/23/23 0514 08/24/23 0343  NA 131* 131* 132* 133* 133* 137  K 3.7 4.1 4.6 3.8 4.1 3.7  CL 96* 94* 94* 95* 97* 107  CO2 26 26 24 26 25 24   ANIONGAP 9 11 14 12 11 6   GLUCOSE 106* 110* 140* 159* 129* 121*  BUN 8 11 19 21 17 19   CREATININE 1.16 1.05 1.25* 1.28* 1.05 0.96  AST 29 22 20   --   --  19  ALT 44 40 29  --   --  20  ALKPHOS 82 83 55  --   --  44  BILITOT 1.1 1.3* 1.3*  --   --  0.3  ALBUMIN 2.6* 2.7* 2.4*  --   --  <1.5*  MG 1.9 2.0 2.0 2.2 2.1 1.9  PHOS 3.4 3.5 4.5 2.4* 2.7 2.6  CALCIUM 8.6* 8.7* 8.4* 8.4* 8.1* 7.6*    Micro Results Recent Results (from the past 240 hours)  MRSA Next Gen by PCR, Nasal     Status: None   Collection Time: 08/20/23 10:14 PM   Specimen: Nasal Mucosa; Nasal Swab  Result Value Ref Range Status   MRSA by PCR Next Gen NOT DETECTED NOT DETECTED Final    Comment: (NOTE) The GeneXpert MRSA Assay (FDA approved for NASAL specimens only), is one component of a comprehensive MRSA colonization surveillance program. It is not intended to diagnose MRSA infection nor to guide or monitor treatment for MRSA infections. Test  performance is not FDA approved in patients less than 14 years old. Performed at [facility], [address] [place], [place] [address]     Radiology Report No results found.   Signature  -   Susa Raring M.D on 08/24/2023 at 10:52 AM   -  To page go to [email or web address]

## 2023-08-24 NOTE — Evaluation (Signed)
 Occupational Therapy Re-Evaluation Patient Details Name: [name] MRN: [id number] DOB: 1945/12/22 Today's Date: 08/24/2023   History of Present Illness   Pt is a 78 y/o M presenting to ED On 2/12 wtih abdominal pain, admitted for SBO vs enteritis, GI +norovirus. S/p ex lap, SB resection, wound vac placement on 2/20. PMH includes HTN, HLD, SBO, back pain, prediabetes, osteoporosis, L TSA, R reverse shoulder arthroplasty, R TKA.     Clinical Impressions Pt seen s/p ex lap, now with abd pain/inscision. Pt needing set up - min A for ADLs, min A for bed mobility and CGA for transfers with RW. Pt educated on log roll technique for bed mobility along with figure 4 technique for LB ADLs, pt able to demo in sitting. Pt presenting with impairments listed below, will follow acutely. Recommend HHOT at d/c pending progression.     If plan is discharge home, recommend the following:   A little help with walking and/or transfers;A little help with bathing/dressing/bathroom;Assistance with cooking/housework;Assist for transportation;Help with stairs or ramp for entrance     Functional Status Assessment   Patient has had a recent decline in their functional status and demonstrates the ability to make significant improvements in function in a reasonable and predictable amount of time.     Equipment Recommendations   None recommended by OT     Recommendations for Other Services   PT consult     Precautions/Restrictions   Precautions Precautions: Fall Precaution/Restrictions Comments: abd wound, L JP drain, NGT Restrictions Weight Bearing Restrictions Per Provider Order: No     Mobility Bed Mobility Overal bed mobility: Needs Assistance Bed Mobility: Sidelying to Sit   Sidelying to sit: Min assist            Transfers Overall transfer level: Needs assistance Equipment used: Rolling walker (2 wheels) Transfers: Sit to/from Stand, Bed to chair/wheelchair/BSC Sit to  Stand: Contact guard assist                  Balance Overall balance assessment: Mild deficits observed, not formally tested                                         ADL either performed or assessed with clinical judgement   ADL Overall ADL's : Needs assistance/impaired Eating/Feeding: Set up   Grooming: Set up;Standing   Upper Body Bathing: Contact guard assist;Sitting   Lower Body Bathing: Minimal assistance;Sitting/lateral leans   Upper Body Dressing : Minimal assistance;Sitting   Lower Body Dressing: Minimal assistance;Sitting/lateral leans   Toilet Transfer: Contact guard assist;Ambulation;Regular Toilet;Rolling walker (2 wheels)   Toileting- Clothing Manipulation and Hygiene: Contact guard assist       Functional mobility during ADLs: Contact guard assist;Rolling walker (2 wheels)       Vision   Vision Assessment?: No apparent visual deficits     Perception Perception: Not tested       Praxis Praxis: Not tested       Pertinent Vitals/Pain Pain Assessment Pain Assessment: Faces Pain Score: 4  Faces Pain Scale: Hurts little more Pain Location: abdomen Pain Descriptors / Indicators: Dull, Grimacing, Guarding Pain Intervention(s): Limited activity within patient's tolerance, Monitored during session, Repositioned     Extremity/Trunk Assessment Upper Extremity Assessment Upper Extremity Assessment: Generalized weakness   Lower Extremity Assessment Lower Extremity Assessment: Defer to PT evaluation   Cervical / Trunk Assessment Cervical / Trunk Assessment:  Other exceptions Cervical / Trunk Exceptions: s/p abdominal surgery   Communication Communication Communication: No apparent difficulties   Cognition Arousal: Alert Behavior During Therapy: WFL for tasks assessed/performed Cognition: No apparent impairments                               Following commands: Intact       Cueing  General Comments           Exercises     Shoulder Instructions      Home Living Family/patient expects to be discharged to:: Private residence Living Arrangements: Spouse/significant other Available Help at Discharge: Family;Available 24 hours/day Type of Home: House Home Access: Stairs to enter [employer] of Steps: 2   Home Layout: Two level;Bed/bath upstairs Alternate Level Stairs-Number of Steps: 14 Alternate Level Stairs-Rails: Can reach both Bathroom Shower/Tub: Producer, television/film/video: Handicapped height Bathroom Accessibility: Yes   Home Equipment: Agricultural consultant (2 wheels);Cane - single point;BSC/3in1;Shower seat          Prior Functioning/Environment Prior Level of Function : Independent/Modified Independent;Driving             Mobility Comments: ind without AD ADLs Comments: ind, drives    OT Problem List: Decreased strength;Decreased range of motion;Decreased activity tolerance;Impaired balance (sitting and/or standing);Decreased cognition;Decreased safety awareness   OT Treatment/Interventions: Self-care/ADL training;Therapeutic exercise;Energy conservation;DME and/or AE instruction;Therapeutic activities;Balance training;Patient/family education      OT Goals(Current goals can be found in the care plan section)   Acute Rehab OT Goals Patient Stated Goal: none stated OT Goal Formulation: With patient Time For Goal Achievement: 09/02/23 Potential to Achieve Goals: Good   OT Frequency:  Min 1X/week    Co-evaluation              AM-PAC OT "6 Clicks" Daily Activity     Outcome Measure Help from another person eating meals?: A Little Help from another person taking care of personal grooming?: A Little Help from another person toileting, which includes using toliet, bedpan, or urinal?: A Little Help from another person bathing (including washing, rinsing, drying)?: A Lot Help from another person to put on and taking off regular upper body  clothing?: A Little Help from another person to put on and taking off regular lower body clothing?: A Little 6 Click Score: 17   End of Session Nurse Communication: Mobility status  Activity Tolerance: Patient tolerated treatment well Patient left: in chair;with call bell/phone within reach;with family/visitor present  OT Visit Diagnosis: Muscle weakness (generalized) (M62.81)                Time: 0272-5366 OT Time Calculation (min): 25 min Charges:  OT General Charges $OT Visit: 1 Visit OT Evaluation $OT Re-eval: 1 Re-eval OT Treatments $Self Care/Home Management : 8-22 mins  Carver Fila, OTD, OTR/L SecureChat Preferred Acute Rehab [phone]   Carver Fila Koonce 08/24/2023, 2:31 PM

## 2023-08-25 DIAGNOSIS — K567 Ileus, unspecified: Secondary | ICD-10-CM | POA: Diagnosis not present

## 2023-08-25 LAB — CBC WITH DIFFERENTIAL/PLATELET
Abs Immature Granulocytes: 0 10*3/uL (ref 0.00–0.07)
Basophils Absolute: 0 10*3/uL (ref 0.0–0.1)
Basophils Relative: 0 %
Eosinophils Absolute: 0.2 10*3/uL (ref 0.0–0.5)
Eosinophils Relative: 2 %
HCT: 24.7 % — ABNORMAL LOW (ref 39.0–52.0)
Hemoglobin: 8 g/dL — ABNORMAL LOW (ref 13.0–17.0)
Lymphocytes Relative: 14 %
Lymphs Abs: 1.3 10*3/uL (ref 0.7–4.0)
MCH: 28.2 pg (ref 26.0–34.0)
MCHC: 32.4 g/dL (ref 30.0–36.0)
MCV: 87 fL (ref 80.0–100.0)
Monocytes Absolute: 0.7 10*3/uL (ref 0.1–1.0)
Monocytes Relative: 7 %
Neutro Abs: 7.4 10*3/uL (ref 1.7–7.7)
Neutrophils Relative %: 77 %
Platelets: 408 10*3/uL — ABNORMAL HIGH (ref 150–400)
RBC: 2.84 MIL/uL — ABNORMAL LOW (ref 4.22–5.81)
RDW: 13.6 % (ref 11.5–15.5)
WBC: 9.6 10*3/uL (ref 4.0–10.5)
nRBC: 0 % (ref 0.0–0.2)
nRBC: 0 /100{WBCs}

## 2023-08-25 LAB — GLUCOSE, CAPILLARY
Glucose-Capillary: 120 mg/dL — ABNORMAL HIGH (ref 70–99)
Glucose-Capillary: 121 mg/dL — ABNORMAL HIGH (ref 70–99)

## 2023-08-25 LAB — SURGICAL PATHOLOGY

## 2023-08-25 MED ORDER — TRAVASOL 10 % IV SOLN
INTRAVENOUS | Status: DC
  Filled 2023-08-25: qty 1200.1

## 2023-08-25 MED ORDER — MELATONIN 5 MG PO TABS
5.0000 mg | ORAL_TABLET | Freq: Every day | ORAL | Status: DC
  Administered 2023-08-25 – 2023-08-27 (×3): 5 mg via ORAL
  Filled 2023-08-25 (×3): qty 1

## 2023-08-25 MED ORDER — ZOLPIDEM TARTRATE 5 MG PO TABS
5.0000 mg | ORAL_TABLET | Freq: Every evening | ORAL | Status: DC | PRN
  Administered 2023-08-26 (×2): 5 mg via ORAL
  Filled 2023-08-25 (×2): qty 1

## 2023-08-25 MED ORDER — ENSURE ENLIVE PO LIQD
237.0000 mL | Freq: Two times a day (BID) | ORAL | Status: DC
  Administered 2023-08-26 – 2023-08-28 (×5): 237 mL via ORAL

## 2023-08-25 NOTE — Plan of Care (Signed)
  Problem: Health Behavior/Discharge Planning: Goal: Ability to manage health-related needs will improve Outcome: Progressing   Problem: Clinical Measurements: Goal: Will remain free from infection Outcome: Progressing Goal: Diagnostic test results will improve Outcome: Progressing   Problem: Nutrition: Goal: Adequate nutrition will be maintained Outcome: Progressing   Problem: Coping: Goal: Level of anxiety will decrease Outcome: Progressing

## 2023-08-25 NOTE — Progress Notes (Signed)
 Nutrition Follow-up  DOCUMENTATION CODES:   Non-severe (moderate) malnutrition in context of social or environmental circumstances  INTERVENTION:  Continue to advance diet as medically applicable.  Ensure Plus High Protein po BID, each supplement provides 350 kcal and 20 grams of protein. TPN at goal rate of 52mL/hr at 1800, providing 2301 kcal and 120g protein meeting approximately 100% of nutritional needs. Electrolytes in TPN: Na 150 mEq/L (max), K 30 mEq/L, Ca 3 mEq/L, Mg 6 mEq/L, Phos 13 mmol/L. Cl:Ac 2:1 Pharmacy to adjust.  Multivitamin   NUTRITION DIAGNOSIS:   Moderate Malnutrition related to social / environmental circumstances (poor appetite after shoulder surgery) as evidenced by moderate fat depletion, moderate muscle depletion.    GOAL:   Patient will meet greater than or equal to 90% of their needs    MONITOR:   Labs  REASON FOR ASSESSMENT:   Consult New TPN/TNA  ASSESSMENT:   Pt with PMH of HTN, HLD, SBO with lap ventral hernia repair with mesh 2019 now admitted after having abd pain, nausea, vomiting, and diarrhea since Monday 2/11. Dx with ileus vs SBO.  Patient diet advanced today.  Patient discussed in rounds should oral intake improve TPN to be discontinued.   Hospital weight history: 08/23/23 0414 93.9 kg 207.01 lbs  08/22/23 0703 97.8 kg 215.61 lbs  08/21/23 0802 97.8 kg 215.61 lbs      Average Meal Intake: No documentation  Nutritionally Relevant Medications: Scheduled Meds:  feeding supplement  237 mL Oral BID BM    Continuous Infusions:  piperacillin-tazobactam (ZOSYN)  IV 3.375 g (08/25/23 1351)   TPN ADULT (ION) 90 mL/hr at 08/24/23 1742   TPN ADULT (ION)     PRN Meds:.fentaNYL (SUBLIMAZE) injection, hydrALAZINE, ketorolac, metoprolol tartrate, ondansetron (ZOFRAN) IV, mouth rinse, phenol, zolpidem  Labs Reviewed    NUTRITION - FOCUSED PHYSICAL EXAM:  Flowsheet Row Most Recent Value  Orbital Region Moderate depletion   Upper Arm Region Moderate depletion  Thoracic and Lumbar Region Moderate depletion  Buccal Region Moderate depletion  Temple Region Severe depletion  Clavicle Bone Region Severe depletion  Clavicle and Acromion Bone Region Moderate depletion  Scapular Bone Region Unable to assess  Dorsal Hand Mild depletion  Patellar Region Moderate depletion  Anterior Thigh Region Moderate depletion  Posterior Calf Region Moderate depletion  Edema (RD Assessment) None  Hair Reviewed  Eyes Reviewed  Mouth Reviewed  Skin Reviewed  Nails Reviewed       Diet Order:   Diet Order             Diet full liquid Room service appropriate? Yes; Fluid consistency: Thin  Diet effective now                   EDUCATION NEEDS:   Education needs have been addressed  Skin:  Skin Assessment: Reviewed RN Assessment Skin Integrity Issues:: Wound VAC, Incisions Wound Vac: abd Incisions: abd  Last BM:  2/25 type 7  Height:   Ht Readings from Last 1 Encounters:  08/22/23 5\' 10"  (1.778 m)    Weight:   Wt Readings from Last 1 Encounters:  08/23/23 93.9 kg    Ideal Body Weight:     BMI:  Body mass index is 29.7 kg/m.  Estimated Nutritional Needs:   Kcal:  2300-2600  Protein:  120-135 grams  Fluid:  >2 L/day    Jamelle Haring RDN, LDN Clinical Dietitian   If unable to reach, please contact "RD Inpatient" secure chat group between 8 am-4 pm  daily"

## 2023-08-25 NOTE — Consult Note (Addendum)
 WOC Nurse Consult Note: The consult was follow by the PA. Reason for Consult: Apply VAC on midline surgical dehiscence Wound type: Surgical. Pressure Injury POA: NA Measurement: 15 x 3 x 2.5 cm Wound bed: 70% red/pink 30% fat and yellow tissue. Drainage (amount, consistency, odor) minimum amount Periwound: intact Dressing procedure/placement/frequency:  Removed old NPWT dressing Cleansed wound with normal saline Periwound skin protected with skin barrier wipe or window framed with drape Skin protected to the hip with VAC drape for foam bridge  Filled wound with 1 piece of black foam  Sealed NPWT dressing at HG Patient received IV/PO pain medication per bedside nurse prior to dressing change Patient tolerated procedure well  WOC nurse will continue to provide NPWT dressing changed due to the complexity of the dressing change.  Changes MONDAY and THURSDAY.  WOC team will follow THUS.  Please reconsult if further assistance is needed. Thank-you,  Denyse Amass BSN, RN, [employer], WOC  (Pager: [phone])

## 2023-08-25 NOTE — Progress Notes (Signed)
 Physical Therapy Treatment Patient Details Name: [name] MRN: [id number] DOB: 1946-04-09 Today's Date: 08/25/2023   History of Present Illness Pt is a 78 y/o M presenting to ED On 2/12 wtih abdominal pain, admitted for SBO vs enteritis, GI +norovirus. S/p ex lap, SB resection, wound vac placement on 2/20. Ex-lap with small bowel anastamosis and closure on 2/22. PMH includes HTN, HLD, SBO, back pain, prediabetes, osteoporosis, L TSA, R reverse shoulder arthroplasty, R TKA.    PT Comments  Pt up in chair on arrival and eager for mobility. Pt demonstrating good progress towards acute goals able to progress ambulation tolerance/endurence this session with RW for support and grossly supervision for safety. Continued education on importance of frequent mobilization with pt verbalizing understanding. Pt continues to benefit from skilled PT services to progress toward functional mobility goals.     If plan is discharge home, recommend the following: A little help with walking and/or transfers;A little help with bathing/dressing/bathroom   Can travel by private vehicle        Equipment Recommendations  None recommended by PT    Recommendations for Other Services       Precautions / Restrictions Precautions Precautions: Fall Recall of Precautions/Restrictions: Intact Precaution/Restrictions Comments: abd wound, L JP drain, wound vac Restrictions Weight Bearing Restrictions Per Provider Order: No     Mobility  Bed Mobility               General bed mobility comments: up in chair on arrival    Transfers Overall transfer level: Needs assistance Equipment used: Rolling walker (2 wheels) Transfers: Sit to/from Stand Sit to Stand: Contact guard assist           General transfer comment: CGa for safety    Ambulation/Gait Ambulation/Gait assistance: Supervision Gait Distance (Feet): 1000 Feet (x1 seated rest at halway point) Assistive device: Rolling walker (2  wheels) Gait Pattern/deviations: Step-through pattern Gait velocity: functional     General Gait Details: steady step-through gait with varibale cadence, some drift in hall to L able to realase grasp on R/L and continue without LOB   Stairs             Wheelchair Mobility     Tilt Bed    Modified Rankin (Stroke Patients Only)       Balance Overall balance assessment: Needs assistance Sitting-balance support: Feet supported, No upper extremity supported Sitting balance-Leahy Scale: Good     Standing balance support: Single extremity supported, Reliant on assistive device for balance Standing balance-Leahy Scale: Poor                              Communication Communication Communication: No apparent difficulties  Cognition Arousal: Alert Behavior During Therapy: WFL for tasks assessed/performed   PT - Cognitive impairments: No apparent impairments                         Following commands: Intact      Cueing Cueing Techniques: Verbal cues  Exercises      General Comments General comments (skin integrity, edema, etc.): VSS on RA, HR up to 108 with distance      Pertinent Vitals/Pain Pain Assessment Pain Assessment: Faces Faces Pain Scale: Hurts a little bit Pain Location: abdomen Pain Descriptors / Indicators: Grimacing Pain Intervention(s): Monitored during session, Limited activity within patient's tolerance    Home Living  Prior Function            PT Goals (current goals can now be found in the care plan section) Acute Rehab PT Goals Patient Stated Goal: to return to independence PT Goal Formulation: With patient Time For Goal Achievement: 09/04/23 Progress towards PT goals: Progressing toward goals    Frequency    Min 1X/week      PT Plan      Co-evaluation              AM-PAC PT "6 Clicks" Mobility   Outcome Measure  Help needed turning from your back to your  side while in a flat bed without using bedrails?: A Little Help needed moving from lying on your back to sitting on the side of a flat bed without using bedrails?: A Little Help needed moving to and from a bed to a chair (including a wheelchair)?: A Little Help needed standing up from a chair using your arms (e.g., wheelchair or bedside chair)?: A Little Help needed to walk in hospital room?: A Little Help needed climbing 3-5 steps with a railing? : A Little 6 Click Score: 18    End of Session   Activity Tolerance: Patient tolerated treatment well Patient left: in chair;with call bell/phone within reach Nurse Communication: Mobility status PT Visit Diagnosis: Other abnormalities of gait and mobility (R26.89);Muscle weakness (generalized) (M62.81)     Time: 0109-3235 PT Time Calculation (min) (ACUTE ONLY): 35 min  Charges:    $Gait Training: 23-37 mins PT General Charges $$ ACUTE PT VISIT: 1 Visit                     [name] PTA Acute Rehabilitation Services Office: [phone]   Catalina Antigua 08/25/2023, 1:33 PM

## 2023-08-25 NOTE — Progress Notes (Signed)
 PHARMACY - TOTAL PARENTERAL NUTRITION CONSULT NOTE   Indication: perforated bowel, SBO, Prolonged ileus  Patient Measurements: Weight: 97.8 kg (215 lb 9.8 oz) Body mass index is 30.94 kg/m. Usual Weight: 97.1kg on admission, 93kg in November 2024   Assessment:  Patient admitted on 2/12 for abdominal pain and was found to have a partial small bowel obstruction and suspected some aspect of viral enteritis with ileus. Hx of 2 prior small bowel resections. Patient had CLD and FLD ordered throughout this time but did not tolerate well since 2/14 due to abdominal distention. Ileus resolved on 2/16 and surgery signed off but patient remained to have minimal PO intake and abdominal pain. Surgery re-consulted 2/20 due to concern for pneumoperitoneum and suspected perforated bowel, patient underwent a ex-lap on 2/20 which found two areas of perforation in the distal jejunum, abdomen left open and in discontinuity after OR.   Pharmacy consulted to dose TPN.   Glucose / Insulin: hx of pre-diabetes, A1C 6.0% 3 months prior, no meds PTA. CBG <120, off insulin Electrolytes: last lytes 2/24: K 3.7 down, CoCa 9.6, mg 1.9 down, other lytes wnl Renal: Scr: 1.05 (baseline ~ 1), BUN wnl, furos IV x1 Hepatic: ALP/AST/ALT/ tbili WNL, TG: 93, albumin <1.5 Intake / Output; MIVF:. reglan 10 Q6H;  UOP 0.7 ml/kg/hr, drains 25ml, LBM 2/24 GI Imaging: 2/20 CT ab: pneumoperitoneum suspected perforation and SBO  2/20 KUB: concerning for SOB  2/19 CT ab: concerning for ileus, gas throughout small bowel not suspicious for pneumatosis  2/11: CT ab: favor SBO than ileus  GI Surgeries / Procedures:  2/22: closure of open abd 2/20: ex-lap with small bowel repair, abdomen left open  2/24 NGT removed   Central access: PICC 2/21  TPN start date: 2/21   Nutritional Goals: Goal TPN rate is 90 mL/hr (provides 120 g of protein and 2301 kcals per day)  RD Assessment: Estimated Needs Total Energy Estimated Needs:  2300-2600 Total Protein Estimated Needs: 120-135 grams Total Fluid Estimated Needs: >2 L/day  Current Nutrition:  TPN 2/24 CLD   Plan:  Continue TPN at goal rate of 5mL/hr at 1800, providing 2301 kcal and 120g protein meeting approximately 100% of nutritional needs. Electrolytes in TPN: Na 150 mEq/L (max), K 30 mEq/L, Ca 3 mEq/L, Mg 6 mEq/L, Phos 13 mmol/L. Cl:Ac 2:1 Add standard MVI and trace elements to TPN  Monitor TPN labs on 2/26 then Mon/Thurs, and PRN F/u PO intake, wean TPN as able   Alphia Moh, PharmD, BCPS, [facility] Clinical Pharmacist  Please check AMION for all [facility] Pharmacy phone numbers After 10:00 PM, call Main Pharmacy [phone]

## 2023-08-25 NOTE — Progress Notes (Signed)
   08/25/23 1622  Mobility  Activity Ambulated with assistance in hallway  Level of Assistance Standby assist, set-up cues, supervision of patient - no hands on  Assistive Device Front wheel walker  Distance Ambulated (ft) 1200 ft  Activity Response Tolerated fair  Mobility Referral Yes  Mobility visit 1 Mobility  Mobility Specialist Start Time (ACUTE ONLY) 1554  Mobility Specialist Stop Time (ACUTE ONLY) 1622  Mobility Specialist Time Calculation (min) (ACUTE ONLY) 28 min   Mobility Specialist: Progress Note  Pre-Mobility:      HR 83, SpO2 99% RA Post-Mobility:    HR 99, SpO2 99% RA  Pt agreeable to mobility session - received in chair. C/o pain at wound vac site rated 5/10. Returned to [facility] with all needs met - call bell within reach. Pt instructed to pull help string when finished. Wife present.   Barnie Mort, BS Mobility Specialist Please contact via SecureChat or  Rehab office at [phone].

## 2023-08-25 NOTE — Progress Notes (Addendum)
 3 Days Post-Op    Subjective: Multiple Bms overnight and passing flatus. Tolerated CLD without n/v. Abdominal pain stable. Low appetite  Sister at bedside  Objective: Vital signs in last 24 hours: Temp:  [98 F (36.7 C)-98.9 F (37.2 C)] 98.4 F (36.9 C) (02/25 0803) Pulse Rate:  [82-89] 83 (02/25 0803) Resp:  [20-24] 24 (02/25 0803) BP: (125-148)/(77-88) 134/82 (02/25 0803) SpO2:  [97 %-99 %] 99 % (02/25 0803) Last BM Date : 08/25/23  Intake/Output from previous day: 02/24 0701 - 02/25 0700 In: 1260.5 [I.V.:1083.9; IV Piggyback:176.6] Out: 1625 [Urine:1600; Drains:25] Intake/Output this shift: Total I/O In: -  Out: 200 [Urine:200]  General appearance: alert and cooperative GI: soft. drain serous, midline with dressing cdi - dressing removed with WOC RN this am with wound bed intact and healthy subcu fat without purulent drainage. Vac placement today    Lab Results: CBC  Recent Labs    08/24/23 0343 08/25/23 0238  WBC 11.2* 9.6  HGB 8.2* 8.0*  HCT 25.0* 24.7*  PLT 373 408*   BMET Recent Labs    08/23/23 0514 08/24/23 0343  NA 133* 137  K 4.1 3.7  CL 97* 107  CO2 25 24  GLUCOSE 129* 121*  BUN 17 19  CREATININE 1.05 0.96  CALCIUM 8.1* 7.6*   PT/INR No results for input(s): "LABPROT", "INR" in the last 72 hours. ABG No results for input(s): "PHART", "HCO3" in the last 72 hours.  Invalid input(s): "PCO2", "PO2"  Studies/Results: No results found.   Anti-infectives: Anti-infectives (From admission, onward)    Start     Dose/Rate Route Frequency Ordered Stop   08/21/23 2200  piperacillin-tazobactam (ZOSYN) IVPB 3.375 g  Status:  Discontinued       Placed in "Followed by" Linked Group   3.375 g 12.5 mL/hr over 240 Minutes Intravenous Every 8 hours 08/20/23 1526 08/21/23 0638   08/21/23 0700  piperacillin-tazobactam (ZOSYN) IVPB 3.375 g       Placed in "Followed by" Linked Group   3.375 g 12.5 mL/hr over 240 Minutes Intravenous Every 8 hours  08/21/23 0638     08/20/23 1615  piperacillin-tazobactam (ZOSYN) IVPB 3.375 g       Placed in "Followed by" Linked Group   3.375 g 100 mL/hr over 30 Minutes Intravenous  Once 08/20/23 1526 08/20/23 1614       Assessment/Plan: Small bowel perforation in setting of ileus related to norovirus s/p exp lap, small bowel resection for 2 separate areas of perforation, left in discontinuity, placement of abthera wound vac 2/20 by AL S/P ex lap, SB anastamosis and closure 2/22 by DB POD 5/3 - tolerating CLD. Advance to FLD - WBC normalized. Hgb has been drifting down. 8 this am. Continue to monitor - continue IV Abx - continue TNA and monitor PO - midline vac today. Change M/TH -PT/OT  FEN: FLD, Ensure, TPN ID: zosyn 2/20 VTE: lovenox    LOS: 12 days    Eric Form, [facility] Surgery 08/25/2023, 10:27 AM Please see Amion for pager number during day hours 7:00am-4:30pm   2/25/2025Patient ID: [name], male   DOB: 1946/02/01, 78 y.o.   MRN: [id number]

## 2023-08-25 NOTE — Progress Notes (Signed)
 PROGRESS NOTE                                                                                                                                                                                                             Patient Demographics:    [name], is a 78 y.o. male, DOB - 05/12/1946, ZOX:[id number]  Outpatient Primary MD for the patient is Bradd Canary, MD    LOS - 12  Admit date - 08/12/2023    Chief Complaint  Patient presents with   Abdominal Pain       Brief Narrative (HPI from H&P)   78 yo male with past medical history of hypertension, dyslipidemia, small bowel obstruction who was admitted to hospital on 08/12/2023 due to norovirus gastroenteritis complicated by small bowel obstruction, failed conservative treatment, was seen by general surgery, failed conservative management, developed pneumoperitoneum due to small bowel perforation requiring surgical intervention on 08/20/2023, he was seen by PCCM, transferred to my service on 08/24/2023 on day 11 of hospital stay still has NG tube, abdominal JP drain, PICC line with TNA.   Significant Hospital Events:  Including procedures   Admitted 2/12 Surgery consulted 2./12 due to ? Sbo Gi consulted 2/17 due to persistent gi dysmotility OR with surgery 2/20, left open for further eval and admitted to ICU.  Small bowel perforation found in the jejunum. OR 2/22 reanastomosis and fascia closure.   Subjective:   Patient in bed, appears comfortable, denies any headache, no fever, no chest pain or pressure, no shortness of breath , no abdominal pain. No focal weakness.  Has had 3 BMs since yesterday.   Assessment  & Plan :   Small bowel obstruction complicated with small bowel perforation, pneumoperitoneum. Seen by GI, general surgery and PCCM, underwent surgical intervention on 08/20/2023, reanastomosis on 08/22/2023, he is still on IV Zosyn, G-tube removed 08/24/2023,  PICC line with TNA, some return and bowel function.  Encourage activity, defer management of this problem to general surgery.  Of note he has had 3 BMs since 08/24/2023, tolerating clear liquid diet.  Hypretension.  As needed IV hydralazine and Lopressor.  Dyslipidemia and BPH.  Resume statin and alpha-blocker once he is taking oral medications and diet reliably.       Condition - Fair  Family Communication  :  None  Code Status :  Full  Consults  :  CCS, PCCM  PUD Prophylaxis : PPI   Procedures  :            Disposition Plan  :    Status is: Inpatient  DVT Prophylaxis  :    enoxaparin (LOVENOX) injection 40 mg Start: 08/23/23 1300  Lab Results  Component Value Date   PLT 408 (H) 08/25/2023    Diet :  Diet Order             Diet clear liquid Room service appropriate? Yes; Fluid consistency: Thin  Diet effective now                    Inpatient Medications  Scheduled Meds:  Chlorhexidine Gluconate Cloth  6 each Topical Daily   enoxaparin (LOVENOX) injection  40 mg Subcutaneous Q24H   metoCLOPramide (REGLAN) injection  10 mg Intravenous Q6H   pantoprazole (PROTONIX) IV  40 mg Intravenous Q24H   sodium chloride flush  10-40 mL Intracatheter Q12H   Continuous Infusions:  piperacillin-tazobactam (ZOSYN)  IV 3.375 g (08/25/23 0628)   TPN ADULT (ION) 90 mL/hr at 08/24/23 1742   TPN ADULT (ION)     PRN Meds:.fentaNYL (SUBLIMAZE) injection, hydrALAZINE, ketorolac, metoprolol tartrate, ondansetron (ZOFRAN) IV, mouth rinse, phenol    Objective:   Vitals:   08/24/23 2000 08/25/23 0000 08/25/23 0400 08/25/23 0803  BP: (!) 141/79 137/77 (!) 148/88 134/82  Pulse: 89 83 82 83  Resp: (!) 21 20 20  (!) 24  Temp: 98.9 F (37.2 C) 98 F (36.7 C) 98.9 F (37.2 C) 98.4 F (36.9 C)  TempSrc: Oral Oral Oral Oral  SpO2: 99% 98% 99% 99%  Weight:      Height:        Wt Readings from Last 3 Encounters:  08/23/23 93.9 kg  08/11/23 97.1 kg  05/14/23 93.9 kg      Intake/Output Summary (Last 24 hours) at 08/25/2023 0956 Last data filed at 08/25/2023 0802 Gross per 24 hour  Intake 1260.53 ml  Output 1825 ml  Net -564.47 ml     Physical Exam  Awake Alert, No new F.N deficits, right arm PICC line, abdominal JP drain [place].AT,PERRAL Supple Neck, No JVD,   Symmetrical Chest wall movement, Good air movement bilaterally, CTAB RRR,No Gallops,Rubs or new Murmurs,  +ve B.Sounds, Abd Soft, No tenderness,   No Cyanosis, Clubbing or edema      Data Review:    Recent Labs  Lab 08/19/23 0623 08/20/23 0618 08/21/23 0824 08/23/23 0514 08/24/23 0343 08/25/23 0238  WBC 6.8 9.0 16.5* 17.0* 11.2* 9.6  HGB 11.1* 11.8* 12.6* 9.9* 8.2* 8.0*  HCT 31.9* 33.8* 37.0* 30.1* 25.0* 24.7*  PLT 307 359 390 415* 373 408*  MCV 82.9 81.6 81.9 84.8 86.8 87.0  MCH 28.8 28.5 27.9 27.9 28.5 28.2  MCHC 34.8 34.9 34.1 32.9 32.8 32.4  RDW 12.7 12.7 13.1 13.0 13.3 13.6  LYMPHSABS 1.4 2.0 0.6*  --  0.4* 1.3  MONOABS 0.8 0.7 0.6  --  0.6 0.7  EOSABS 0.1 0.0 0.0  --  0.3 0.2  BASOSABS 0.0 0.0 0.0  --  0.0 0.0    Recent Labs  Lab 08/19/23 0623 08/20/23 0618 08/21/23 0457 08/22/23 0412 08/23/23 0514 08/24/23 0343  NA 131* 131* 132* 133* 133* 137  K 3.7 4.1 4.6 3.8 4.1 3.7  CL 96* 94* 94* 95* 97* 107  CO2 26 26 24 26 25 24   ANIONGAP 9 11 14  12  11 6  GLUCOSE 106* 110* 140* 159* 129* 121*  BUN 8 11 19 21 17 19   CREATININE 1.16 1.05 1.25* 1.28* 1.05 0.96  AST 29 22 20   --   --  19  ALT 44 40 29  --   --  20  ALKPHOS 82 83 55  --   --  44  BILITOT 1.1 1.3* 1.3*  --   --  0.3  ALBUMIN 2.6* 2.7* 2.4*  --   --  <1.5*  MG 1.9 2.0 2.0 2.2 2.1 1.9  PHOS 3.4 3.5 4.5 2.4* 2.7 2.6  CALCIUM 8.6* 8.7* 8.4* 8.4* 8.1* 7.6*    Micro Results Recent Results (from the past 240 hours)  MRSA Next Gen by PCR, Nasal     Status: None   Collection Time: 08/20/23 10:14 PM   Specimen: Nasal Mucosa; Nasal Swab  Result Value Ref Range Status   MRSA by PCR Next Gen NOT DETECTED  NOT DETECTED Final    Comment: (NOTE) The GeneXpert MRSA Assay (FDA approved for NASAL specimens only), is one component of a comprehensive MRSA colonization surveillance program. It is not intended to diagnose MRSA infection nor to guide or monitor treatment for MRSA infections. Test performance is not FDA approved in patients less than 47 years old. Performed at [facility], [address], [place], [place] [address]     Radiology Report No results found.   Signature  -   Susa Raring M.D on 08/25/2023 at 9:56 AM   -  To page go to [email or web address]

## 2023-08-26 DIAGNOSIS — K567 Ileus, unspecified: Secondary | ICD-10-CM | POA: Diagnosis not present

## 2023-08-26 LAB — COMPREHENSIVE METABOLIC PANEL
ALT: 323 U/L — ABNORMAL HIGH (ref 0–44)
AST: 288 U/L — ABNORMAL HIGH (ref 15–41)
Albumin: 1.6 g/dL — ABNORMAL LOW (ref 3.5–5.0)
Alkaline Phosphatase: 84 U/L (ref 38–126)
Anion gap: 7 (ref 5–15)
BUN: 18 mg/dL (ref 8–23)
CO2: 22 mmol/L (ref 22–32)
Calcium: 7.9 mg/dL — ABNORMAL LOW (ref 8.9–10.3)
Chloride: 110 mmol/L (ref 98–111)
Creatinine, Ser: 0.85 mg/dL (ref 0.61–1.24)
GFR, Estimated: >60 mL/min (ref >60)
Glucose, Bld: 133 mg/dL — ABNORMAL HIGH (ref 70–99)
Potassium: 3.8 mmol/L (ref 3.5–5.1)
Sodium: 139 mmol/L (ref 135–145)
Total Bilirubin: 0.4 mg/dL (ref 0.0–1.2)
Total Protein: 5.2 g/dL — ABNORMAL LOW (ref 6.5–8.1)

## 2023-08-26 LAB — CBC WITH DIFFERENTIAL/PLATELET
Abs Immature Granulocytes: 0.32 10*3/uL — ABNORMAL HIGH (ref 0.00–0.07)
Basophils Absolute: 0 10*3/uL (ref 0.0–0.1)
Basophils Relative: 0 %
Eosinophils Absolute: 0.2 10*3/uL (ref 0.0–0.5)
Eosinophils Relative: 2 %
HCT: 23.7 % — ABNORMAL LOW (ref 39.0–52.0)
Hemoglobin: 7.5 g/dL — ABNORMAL LOW (ref 13.0–17.0)
Immature Granulocytes: 4 %
Lymphocytes Relative: 12 %
Lymphs Abs: 1.1 10*3/uL (ref 0.7–4.0)
MCH: 28.1 pg (ref 26.0–34.0)
MCHC: 31.6 g/dL (ref 30.0–36.0)
MCV: 88.8 fL (ref 80.0–100.0)
Monocytes Absolute: 0.6 10*3/uL (ref 0.1–1.0)
Monocytes Relative: 7 %
Neutro Abs: 6.5 10*3/uL (ref 1.7–7.7)
Neutrophils Relative %: 75 %
Platelets: 375 10*3/uL (ref 150–400)
RBC: 2.67 MIL/uL — ABNORMAL LOW (ref 4.22–5.81)
RDW: 13.5 % (ref 11.5–15.5)
WBC: 8.7 10*3/uL (ref 4.0–10.5)
nRBC: 0 % (ref 0.0–0.2)

## 2023-08-26 LAB — PREPARE RBC (CROSSMATCH)

## 2023-08-26 LAB — PROCALCITONIN: Procalcitonin: 0.42 ng/mL

## 2023-08-26 LAB — C-REACTIVE PROTEIN: CRP: 7.9 mg/dL — ABNORMAL HIGH (ref <1.0)

## 2023-08-26 LAB — BRAIN NATRIURETIC PEPTIDE: B Natriuretic Peptide: 310.5 pg/mL — ABNORMAL HIGH (ref 0.0–100.0)

## 2023-08-26 LAB — PHOSPHORUS: Phosphorus: 3.2 mg/dL (ref 2.5–4.6)

## 2023-08-26 LAB — MAGNESIUM: Magnesium: 1.9 mg/dL (ref 1.7–2.4)

## 2023-08-26 LAB — GLUCOSE, CAPILLARY: Glucose-Capillary: 95 mg/dL (ref 70–99)

## 2023-08-26 MED ORDER — OXYCODONE HCL 5 MG PO TABS
5.0000 mg | ORAL_TABLET | ORAL | Status: DC | PRN
  Administered 2023-08-26 – 2023-08-27 (×3): 10 mg via ORAL
  Filled 2023-08-26 (×3): qty 2

## 2023-08-26 MED ORDER — SODIUM CHLORIDE 0.9% IV SOLUTION: Freq: Once | INTRAVENOUS | Status: AC

## 2023-08-26 MED ORDER — ACETAMINOPHEN 500 MG PO TABS: 1000.0000 mg | ORAL_TABLET | Freq: Four times a day (QID) | ORAL | Status: DC | PRN

## 2023-08-26 MED ORDER — DILTIAZEM HCL 100 MG IV SOLR
5.0000 mg/h | INTRAVENOUS | Status: DC
Start: 2023-08-26 — End: 2023-08-26

## 2023-08-26 NOTE — Progress Notes (Addendum)
 Occupational Therapy Treatment Patient Details Name: [name] MRN: [id number] DOB: 01/12/1946 Today's Date: 08/26/2023   History of present illness Pt is a 78 y/o M presenting to ED On 2/12 wtih abdominal pain, admitted for SBO vs enteritis, GI +norovirus. S/p ex lap, SB resection, wound vac placement on 2/20. Ex-lap with small bowel anastamosis and closure on 2/22. PMH includes HTN, HLD, SBO, back pain, prediabetes, osteoporosis, L TSA, R reverse shoulder arthroplasty, R TKA.   OT comments  Pt progressing toward goals able to complete LB ADL, toileting and standing grooming tasks with up to CGA, pt CGA for transfers and use of RW for longer distance ambulation, however able to navigate room without AD. Assist for vac, lines, leads throughout. Educated pt on use of pillow to brace abdomen as needed with mobility, coughing, sneezing, etc. Pt presenting with impairments listed below, will follow acutely. Continue to recommend HHOT at d/c pending progression.      If plan is discharge home, recommend the following:  A little help with walking and/or transfers;A little help with bathing/dressing/bathroom;Assistance with cooking/housework;Assist for transportation;Help with stairs or ramp for entrance   Equipment Recommendations  None recommended by OT    Recommendations for Other Services PT consult    Precautions / Restrictions Precautions Precautions: Fall Recall of Precautions/Restrictions: Intact Precaution/Restrictions Comments: abd wound, L JP drain, wound vac Restrictions Weight Bearing Restrictions Per Provider Order: No       Mobility Bed Mobility               General bed mobility comments: up in chair on arrival and departure    Transfers Overall transfer level: Needs assistance Equipment used: Rolling walker (2 wheels) Transfers: Sit to/from Stand Sit to Stand: Contact guard assist                 Balance Overall balance assessment: Needs  assistance Sitting-balance support: Feet supported, No upper extremity supported Sitting balance-Leahy Scale: Good     Standing balance support: During functional activity Standing balance-Leahy Scale: Fair Standing balance comment: fair with RW                           ADL either performed or assessed with clinical judgement   ADL Overall ADL's : Needs assistance/impaired     Grooming: Oral care;Sitting;Standing Grooming Details (indicate cue type and reason): standing at sink             Lower Body Dressing: Contact guard assist Lower Body Dressing Details (indicate cue type and reason): reaches down to pull up socks Toilet Transfer: Contact guard assist;Ambulation;Regular Toilet;Rolling walker (2 wheels)   Toileting- Clothing Manipulation and Hygiene: Contact guard assist;Sitting/lateral lean       Functional mobility during ADLs: Contact guard assist      Extremity/Trunk Assessment Upper Extremity Assessment Upper Extremity Assessment: Generalized weakness   Lower Extremity Assessment Lower Extremity Assessment: Defer to PT evaluation        Vision   Vision Assessment?: No apparent visual deficits   Perception Perception Perception: Not tested   Praxis Praxis Praxis: Not tested   Communication Communication Communication: No apparent difficulties   Cognition Arousal: Alert Behavior During Therapy: WFL for tasks assessed/performed Cognition: No apparent impairments                               Following commands: Intact  Cueing   Cueing Techniques: Verbal cues  Exercises      Shoulder Instructions       General Comments VSS on RA    Pertinent Vitals/ Pain       Pain Assessment Pain Assessment: Faces Pain Score: 2  Pain Location: abdomen Pain Descriptors / Indicators: Grimacing Pain Intervention(s): Limited activity within patient's tolerance, Monitored during session, Repositioned  Home Living                                           Prior Functioning/Environment              Frequency  Min 1X/week        Progress Toward Goals  OT Goals(current goals can now be found in the care plan section)  Progress towards OT goals: Progressing toward goals  Acute Rehab OT Goals Patient Stated Goal: none stated OT Goal Formulation: With patient Time For Goal Achievement: 09/02/23 Potential to Achieve Goals: Good ADL Goals Pt Will Perform Upper Body Dressing: sitting;Independently Pt Will Perform Lower Body Dressing: Independently;sitting/lateral leans;sit to/from stand Pt Will Transfer to Toilet: Independently;ambulating;regular height toilet Pt Will Perform Tub/Shower Transfer: Independently;ambulating;shower seat;Shower transfer  Plan      Co-evaluation                 AM-PAC OT "6 Clicks" Daily Activity     Outcome Measure   Help from another person eating meals?: A Little Help from another person taking care of personal grooming?: A Little Help from another person toileting, which includes using toliet, bedpan, or urinal?: A Little Help from another person bathing (including washing, rinsing, drying)?: A Lot Help from another person to put on and taking off regular upper body clothing?: A Little Help from another person to put on and taking off regular lower body clothing?: A Little 6 Click Score: 17    End of Session Equipment Utilized During Treatment: Rolling walker (2 wheels)  OT Visit Diagnosis: Muscle weakness (generalized) (M62.81)   Activity Tolerance Patient tolerated treatment well   Patient Left in chair;with call bell/phone within reach   Nurse Communication Mobility status        Time: 1610-9604 OT Time Calculation (min): 28 min  Charges: OT General Charges $OT Visit: 1 Visit OT Treatments $Self Care/Home Management : 8-22 mins $Therapeutic Activity: 8-22 mins  Carver Fila, OTD, OTR/L SecureChat Preferred Acute  Rehab [phone]   Carver Fila Koonce 08/26/2023, 9:23 AM

## 2023-08-26 NOTE — TOC Initial Note (Addendum)
 Transition of Care [facility]) - Initial/Assessment Note    Patient Details  Name: [name] MRN: [id number] Date of Birth: 04-20-1946  Transition of Care [facility]) CM/SW Contact:    Lamonte Sakai, Student-Social Work Phone Number: 08/26/2023, 9:29 AM  Clinical Narrative:                  Pt admitted from home with spouse. OT recommending HHOT. TOC following.       Patient Goals and CMS Choice            Expected Discharge Plan and Services       Living arrangements for the past 2 months: Single Family Home                                      Prior Living Arrangements/Services Living arrangements for the past 2 months: Single Family Home Lives with:: Spouse                   Activities of Daily Living   ADL Screening (condition at time of admission) Independently performs ADLs?: Yes (appropriate for developmental age) Is the patient deaf or have difficulty hearing?: No Does the patient have difficulty seeing, even when wearing glasses/contacts?: No Does the patient have difficulty concentrating, remembering, or making decisions?: No  Permission Sought/Granted                  Emotional Assessment       Orientation: : Oriented to Self, Oriented to Place, Oriented to  Time, Oriented to Situation      Admission diagnosis:  Small bowel obstruction (HCC) [K56.609] SBO (small bowel obstruction) (HCC) [K56.609] Patient Active Problem List   Diagnosis Date Noted   Malnutrition of moderate degree 08/21/2023   SBO (small bowel obstruction) (HCC) 08/13/2023   Ileus (HCC) 08/12/2023   Constipation 05/14/2023   PTSD (post-traumatic stress disorder) 05/14/2023   Primary osteoarthritis of right shoulder 03/14/2023   Biceps tendonitis on right 03/14/2023   Shoulder fracture 03/12/2023   S/P reverse total shoulder arthroplasty, right 03/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   Arthritis of right knee 07/27/2022   S/P total knee arthroplasty, right 07/24/2022   Shoulder arthritis     S/P shoulder replacement, 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   Skin tags, multiple acquired 07/07/2019   LUQ pain 07/07/2019   OA (osteoarthritis) of knee 03/16/2019   Ventral hernia s/p lap repair with mesh 06/03/2018 06/03/2018   Hemorrhoids 08/22/2016   Anemia 03/16/2016   Right shoulder pain 02/26/2016   Abnormal CT scan, gastrointestinal tract suspect angioedema    Abdominal pain    Hyperglycemia 08/21/2013   Low back pain 10/10/2012   Hyperlipidemia    Hypertension, essential     Obesity (BMI 30.0-34.9)    PCP:  Bradd Canary, MD Pharmacy:   [facility] DELIVERY - [address], [place] - [address] [place] [address] Phone: [phone] Fax: [phone]  [id number] - [place], [place] - [address] AT [place] [address] [place] [address] Phone: [phone] Fax: [phone]     Social Drivers of Health (SDOH) Social History: SDOH Screenings   Food Insecurity: No Food Insecurity (08/12/2023)  Housing: Low Risk  (08/12/2023)  Transportation Needs: No Transportation Needs (08/12/2023)  Utilities: Not At Risk (08/12/2023)  Alcohol Screen: Low Risk  (05/05/2023)  Depression (PHQ2-9): Low Risk  (  05/14/2023)  Financial Resource Strain: Low Risk  (05/05/2023)  Physical Activity: Sufficiently Active (05/05/2023)  Social Connections: Socially Integrated (08/12/2023)  Stress: No Stress Concern Present (05/05/2023)  Tobacco Use: Low Risk  (08/22/2023)  Health Literacy: Adequate Health Literacy (05/05/2023)   SDOH Interventions:     Readmission Risk Interventions     No data to display

## 2023-08-26 NOTE — Plan of Care (Signed)
  Problem: Education: Goal: Knowledge of General Education information will improve Description: Including pain rating scale, medication(s)/side effects and non-pharmacologic comfort measures Outcome: Progressing   Problem: Clinical Measurements: Goal: Will remain free from infection Outcome: Progressing Goal: Cardiovascular complication will be avoided Outcome: Progressing   Problem: Nutrition: Goal: Adequate nutrition will be maintained Outcome: Progressing   Problem: Coping: Goal: Level of anxiety will decrease Outcome: Progressing   Problem: Elimination: Goal: Will not experience complications related to urinary retention Outcome: Progressing   Problem: Pain Managment: Goal: General experience of comfort will improve and/or be controlled Outcome: Progressing   Problem: Metabolic: Goal: Ability to maintain appropriate glucose levels will improve Outcome: Progressing

## 2023-08-26 NOTE — TOC Progression Note (Signed)
 Transition of Care [facility]) - Progression Note    Patient Details  Name: [name] MRN: [id number] Date of Birth: August 22, 1945  Transition of Care [facility]) CM/SW Contact  Gordy Clement, RN Phone Number: 08/26/2023, 2:53 PM  Clinical Narrative:     RNCM has reached out to [facility] for wound vac. [facility] will provide SN, PT and OT. TOC will continue to follow for additional needs          Expected Discharge Plan and Services       Living arrangements for the past 2 months: Single Family Home                                       Social Determinants of Health (SDOH) Interventions SDOH Screenings   Food Insecurity: No Food Insecurity (08/12/2023)  Housing: Low Risk  (08/12/2023)  Transportation Needs: No Transportation Needs (08/12/2023)  Utilities: Not At Risk (08/12/2023)  Alcohol Screen: Low Risk  (05/05/2023)  Depression (PHQ2-9): Low Risk  (05/14/2023)  Financial Resource Strain: Low Risk  (05/05/2023)  Physical Activity: Sufficiently Active (05/05/2023)  Social Connections: Socially Integrated (08/12/2023)  Stress: No Stress Concern Present (05/05/2023)  Tobacco Use: Low Risk  (08/22/2023)  Health Literacy: Adequate Health Literacy (05/05/2023)    Readmission Risk Interventions     No data to display

## 2023-08-26 NOTE — Progress Notes (Signed)
 PROGRESS NOTE                                                                                                                                                                                                             Patient Demographics:    [name], is a 78 y.o. male, DOB - 1945-12-15, WUJ:[id number]  Outpatient Primary MD for the patient is Bradd Canary, MD    LOS - 13  Admit date - 08/12/2023    Chief Complaint  Patient presents with   Abdominal Pain       Brief Narrative (HPI from H&P)   78 yo male with past medical history of hypertension, dyslipidemia, small bowel obstruction who was admitted to hospital on 08/12/2023 due to norovirus gastroenteritis complicated by small bowel obstruction, failed conservative treatment, was seen by general surgery, failed conservative management, developed pneumoperitoneum due to small bowel perforation requiring surgical intervention on 08/20/2023, he was seen by PCCM, transferred to my service on 08/24/2023 on day 11 of hospital stay still has NG tube, abdominal JP drain, PICC line with TNA.   Significant Hospital Events:  Including procedures   Admitted 2/12 Surgery consulted 2./12 due to ? Sbo Gi consulted 2/17 due to persistent gi dysmotility OR with surgery 2/20, left open for further eval and admitted to ICU.  Small bowel perforation found in the jejunum. OR 2/22 reanastomosis and fascia closure.   Subjective:   Patient in bed, appears comfortable, denies any headache, no fever, no chest pain or pressure, no shortness of breath , no abdominal pain. No focal weakness.  No nausea, tolerating oral diet well.   Assessment  & Plan :   Small bowel obstruction complicated with small bowel perforation, pneumoperitoneum. Seen by GI, general surgery and PCCM, underwent surgical intervention on 08/20/2023, reanastomosis on 08/22/2023, he is still on IV Zosyn, G-tube removed  08/24/2023, PICC line with TNA, some return and bowel function.  Encourage activity, defer management of this problem to general surgery.  Of note he has had 3 BMs since 08/24/2023, tolerating clear liquid diet.  Hypretension.  As needed IV hydralazine and Lopressor.  Dyslipidemia and BPH.  Resume statin and alpha-blocker once he is taking oral medications and diet reliably.  Anemia of acute illness.  No signs of active bleeding 1 unit of packed RBC on 08/25/2022.  Asymptomatic transaminitis.  Likely due to TNA, stop TNA as he is on oral diet, trend and monitor,      Condition - Fair  Family Communication  :  None  Code Status :  Full  Consults  :  CCS, PCCM  PUD Prophylaxis : PPI   Procedures  :            Disposition Plan  :    Status is: Inpatient  DVT Prophylaxis  :    enoxaparin (LOVENOX) injection 40 mg Start: 08/23/23 1300  Lab Results  Component Value Date   PLT 375 08/26/2023    Diet :  Diet Order             Diet full liquid Room service appropriate? Yes; Fluid consistency: Thin  Diet effective now                    Inpatient Medications  Scheduled Meds:  sodium chloride   Intravenous Once   Chlorhexidine Gluconate Cloth  6 each Topical Daily   enoxaparin (LOVENOX) injection  40 mg Subcutaneous Q24H   feeding supplement  237 mL Oral BID BM   melatonin  5 mg Oral QHS   pantoprazole (PROTONIX) IV  40 mg Intravenous Q24H   sodium chloride flush  10-40 mL Intracatheter Q12H   Continuous Infusions:  piperacillin-tazobactam (ZOSYN)  IV 3.375 g (08/26/23 0757)   PRN Meds:.fentaNYL (SUBLIMAZE) injection, hydrALAZINE, ketorolac, metoprolol tartrate, ondansetron (ZOFRAN) IV, mouth rinse, phenol, zolpidem    Objective:   Vitals:   08/25/23 2012 08/25/23 2351 08/26/23 0433 08/26/23 0500  BP: (!) 141/81 136/79 132/76   Pulse: 91 83 84   Resp: 20 (!) 21 20   Temp: 99.4 F (37.4 C) 98.5 F (36.9 C) 98.5 F (36.9 C)   TempSrc: Oral Oral Oral    SpO2: 98% 96% 96%   Weight:    95.1 kg  Height:        Wt Readings from Last 3 Encounters:  08/26/23 95.1 kg  08/11/23 97.1 kg  05/14/23 93.9 kg     Intake/Output Summary (Last 24 hours) at 08/26/2023 0838 Last data filed at 08/26/2023 0500 Gross per 24 hour  Intake 1039.31 ml  Output 475 ml  Net 564.31 ml     Physical Exam  Awake Alert, No new F.N deficits, right arm PICC line, abdominal JP drain [place].AT,PERRAL Supple Neck, No JVD,   Symmetrical Chest wall movement, Good air movement bilaterally, CTAB RRR,No Gallops,Rubs or new Murmurs,  +ve B.Sounds, Abd Soft, No tenderness,   No Cyanosis, Clubbing or edema      Data Review:    Recent Labs  Lab 08/20/23 0618 08/21/23 0824 08/23/23 0514 08/24/23 0343 08/25/23 0238 08/26/23 0237  WBC 9.0 16.5* 17.0* 11.2* 9.6 8.7  HGB 11.8* 12.6* 9.9* 8.2* 8.0* 7.5*  HCT 33.8* 37.0* 30.1* 25.0* 24.7* 23.7*  PLT 359 390 415* 373 408* 375  MCV 81.6 81.9 84.8 86.8 87.0 88.8  MCH 28.5 27.9 27.9 28.5 28.2 28.1  MCHC 34.9 34.1 32.9 32.8 32.4 31.6  RDW 12.7 13.1 13.0 13.3 13.6 13.5  LYMPHSABS 2.0 0.6*  --  0.4* 1.3 1.1  MONOABS 0.7 0.6  --  0.6 0.7 0.6  EOSABS 0.0 0.0  --  0.3 0.2 0.2  BASOSABS 0.0 0.0  --  0.0 0.0 0.0    Recent Labs  Lab 08/20/23 0618 08/21/23 0457 08/22/23 0412 08/23/23 0514 08/24/23 0343 08/26/23 0237  NA 131* 132* 133*  133* 137 139  K 4.1 4.6 3.8 4.1 3.7 3.8  CL 94* 94* 95* 97* 107 110  CO2 26 24 26 25 24 22   ANIONGAP 11 14 12 11 6 7   GLUCOSE 110* 140* 159* 129* 121* 133*  BUN 11 19 21 17 19 18   CREATININE 1.05 1.25* 1.28* 1.05 0.96 0.85  AST 22 20  --   --  19 288*  ALT 40 29  --   --  20 323*  ALKPHOS 83 55  --   --  44 84  BILITOT 1.3* 1.3*  --   --  0.3 0.4  ALBUMIN 2.7* 2.4*  --   --  <1.5* 1.6*  CRP  --   --   --   --   --  7.9*  PROCALCITON  --   --   --   --   --  0.42  BNP  --   --   --   --   --  310.5*  MG 2.0 2.0 2.2 2.1 1.9 1.9  PHOS 3.5 4.5 2.4* 2.7 2.6 3.2  CALCIUM 8.7* 8.4*  8.4* 8.1* 7.6* 7.9*    Micro Results Recent Results (from the past 240 hours)  MRSA Next Gen by PCR, Nasal     Status: None   Collection Time: 08/20/23 10:14 PM   Specimen: Nasal Mucosa; Nasal Swab  Result Value Ref Range Status   MRSA by PCR Next Gen NOT DETECTED NOT DETECTED Final    Comment: (NOTE) The GeneXpert MRSA Assay (FDA approved for NASAL specimens only), is one component of a comprehensive MRSA colonization surveillance program. It is not intended to diagnose MRSA infection nor to guide or monitor treatment for MRSA infections. Test performance is not FDA approved in patients less than 38 years old. Performed at [facility], [address] [place], [place] [address]     Radiology Report No results found.   Signature  -   Susa Raring M.D on 08/26/2023 at 8:38 AM   -  To page go to [email or web address]

## 2023-08-26 NOTE — Plan of Care (Cosign Needed)
 As a Runner, broadcasting/film/video, alongside RN Clenton, I assumed care of [name] around [phone]. [name] was admitted due to a small bowel obstruction and underwent a bowel resection procedure, along with a wound vac placement. He was also found to be norovirus positive during hospitalization and enteric precautions were initiated. When I first went into [name] room, he moved from the bed to the chair, as a x1Assist. At 0757, I assisted in hanging his antibiotic, Zosyn. I also was able to assess his abdomen, where he has a midline incision, with dressing. He also had complaints of pain. I peformed a pain assessment and [name] was experiencing pain in the middle of abdomen and described it as dull and aching. Then, administered his fentanyl IV. Around 0930, I assisted [name] with going to the restroom. He was able to have a bowel movement and stated that it was loose but contained solid pieces as well. He was returned back to the chair. His labs showed low hemoglobin, HCT, and RBC's and RN Clenton received an order to give [name] blood. Around 1100, I assisted in ensuring the consent form was completed, NS was hung, and I collected his vital signs.They were normal. The blood was collected and verified with RN Clenton and another nurse on the 5W unit. I took another set of vital signs on [name] right before administering the blood and assisted with that process. I stayed with [name] for 15-20 minutes after his infusion began. There were no signs of a reaction. His vital signs were taken again at 1135. [name] tolerated the blood infusion well. At 1500, [name] was due. I changed his primary and secondary lines and hung NS before administering the antibiotic. Zosyn was administered. At 1650, I did a full head to toe assessment on [name]. It would be beneficial to continue following up on [name] ileus, ensuring he is tolerating his liquids and food.

## 2023-08-26 NOTE — Progress Notes (Addendum)
 4 Days Post-Op    Subjective: Had at least 8 loose Bms but most recent seemed to be thickening. Tolerating fulls without n/v and describes his abdominal pain as negligible. He does have some crampy abdominal pain that feels like gas which improves with bowel movements. Ate almost all his food yesterday  Objective: Vital signs in last 24 hours: Temp:  [97.8 F (36.6 C)-99.4 F (37.4 C)] 98 F (36.7 C) (02/26 1135) Pulse Rate:  [83-95] 89 (02/26 1135) Resp:  [16-24] 18 (02/26 1135) BP: (129-141)/(76-87) 132/80 (02/26 1135) SpO2:  [95 %-98 %] 95 % (02/26 1135) Weight:  [95.1 kg] 95.1 kg (02/26 0500) Last BM Date : 08/25/23  Intake/Output from previous day: 02/25 0701 - 02/26 0700 In: 1039.3 [I.V.:989.3; IV Piggyback:50] Out: 675 [Urine:650; Drains:25] Intake/Output this shift: No intake/output data recorded.  General appearance: alert and cooperative GI: soft. drain serous, midline Vac with good suction    Lab Results: CBC  Recent Labs    08/25/23 0238 08/26/23 0237  WBC 9.6 8.7  HGB 8.0* 7.5*  HCT 24.7* 23.7*  PLT 408* 375   BMET Recent Labs    08/24/23 0343 08/26/23 0237  NA 137 139  K 3.7 3.8  CL 107 110  CO2 24 22  GLUCOSE 121* 133*  BUN 19 18  CREATININE 0.96 0.85  CALCIUM 7.6* 7.9*   PT/INR No results for input(s): "LABPROT", "INR" in the last 72 hours. ABG No results for input(s): "PHART", "HCO3" in the last 72 hours.  Invalid input(s): "PCO2", "PO2"  Studies/Results: No results found.   Anti-infectives: Anti-infectives (From admission, onward)    Start     Dose/Rate Route Frequency Ordered Stop   08/21/23 2200  piperacillin-tazobactam (ZOSYN) IVPB 3.375 g  Status:  Discontinued       Placed in "Followed by" Linked Group   3.375 g 12.5 mL/hr over 240 Minutes Intravenous Every 8 hours 08/20/23 1526 08/21/23 0638   08/21/23 0700  piperacillin-tazobactam (ZOSYN) IVPB 3.375 g       Placed in "Followed by" Linked Group   3.375 g 12.5 mL/hr  over 240 Minutes Intravenous Every 8 hours 08/21/23 0638     08/20/23 1615  piperacillin-tazobactam (ZOSYN) IVPB 3.375 g       Placed in "Followed by" Linked Group   3.375 g 100 mL/hr over 30 Minutes Intravenous  Once 08/20/23 1526 08/20/23 1614       Assessment/Plan: Small bowel perforation in setting of ileus related to norovirus s/p exp lap, small bowel resection for 2 separate areas of perforation, left in discontinuity, placement of abthera wound vac 2/20 by AL S/P ex lap, SB anastamosis and closure 2/22 by DB POD 6/4 - tolerating FLD, advance to soft. Dc tpn -  Hgb down to 7.5 - pRBC transfusion per TRH - WBC normalized. stop IV Abx tomorrow once he has completed 5D postop - midline vac. Change M/TH. Orders placed for home vac -PT/OT  Approaching surgical stability for dc in next 24-48 hours pending diet tolerance and dec frequency of bowel movements  FEN: soft, ensure ID: zosyn 2/20 VTE: lovenox    LOS: 13 days    Eric Form, [facility] 08/26/2023, 11:50 AM Please see Amion for pager number during day hours 7:00am-4:30pm   2/26/2025Patient ID: [name], male   DOB: [birth year], 78 y.o.   MRN: [id number]

## 2023-08-27 DIAGNOSIS — K567 Ileus, unspecified: Secondary | ICD-10-CM | POA: Diagnosis not present

## 2023-08-27 LAB — CBC WITH DIFFERENTIAL/PLATELET
Abs Immature Granulocytes: 0.23 10*3/uL — ABNORMAL HIGH (ref 0.00–0.07)
Basophils Absolute: 0 10*3/uL (ref 0.0–0.1)
Basophils Relative: 0 %
Eosinophils Absolute: 0.1 10*3/uL (ref 0.0–0.5)
Eosinophils Relative: 1 %
HCT: 27.5 % — ABNORMAL LOW (ref 39.0–52.0)
Hemoglobin: 8.7 g/dL — ABNORMAL LOW (ref 13.0–17.0)
Immature Granulocytes: 2 %
Lymphocytes Relative: 12 %
Lymphs Abs: 1.3 10*3/uL (ref 0.7–4.0)
MCH: 27.6 pg (ref 26.0–34.0)
MCHC: 31.6 g/dL (ref 30.0–36.0)
MCV: 87.3 fL (ref 80.0–100.0)
Monocytes Absolute: 0.7 10*3/uL (ref 0.1–1.0)
Monocytes Relative: 7 %
Neutro Abs: 8 10*3/uL — ABNORMAL HIGH (ref 1.7–7.7)
Neutrophils Relative %: 78 %
Platelets: 403 10*3/uL — ABNORMAL HIGH (ref 150–400)
RBC: 3.15 MIL/uL — ABNORMAL LOW (ref 4.22–5.81)
RDW: 13.8 % (ref 11.5–15.5)
WBC: 10.4 10*3/uL (ref 4.0–10.5)
nRBC: 0 % (ref 0.0–0.2)

## 2023-08-27 LAB — TYPE AND SCREEN
ABO/RH(D): A POS
Antibody Screen: NEGATIVE
Unit division: 0

## 2023-08-27 LAB — PHOSPHORUS: Phosphorus: 3.1 mg/dL (ref 2.5–4.6)

## 2023-08-27 LAB — COMPREHENSIVE METABOLIC PANEL
ALT: 230 U/L — ABNORMAL HIGH (ref 0–44)
AST: 95 U/L — ABNORMAL HIGH (ref 15–41)
Albumin: 1.7 g/dL — ABNORMAL LOW (ref 3.5–5.0)
Alkaline Phosphatase: 83 U/L (ref 38–126)
Anion gap: 8 (ref 5–15)
BUN: 15 mg/dL (ref 8–23)
CO2: 21 mmol/L — ABNORMAL LOW (ref 22–32)
Calcium: 8 mg/dL — ABNORMAL LOW (ref 8.9–10.3)
Chloride: 107 mmol/L (ref 98–111)
Creatinine, Ser: 0.88 mg/dL (ref 0.61–1.24)
GFR, Estimated: >60 mL/min (ref >60)
Glucose, Bld: 104 mg/dL — ABNORMAL HIGH (ref 70–99)
Potassium: 4.1 mmol/L (ref 3.5–5.1)
Sodium: 136 mmol/L (ref 135–145)
Total Bilirubin: 0.6 mg/dL (ref 0.0–1.2)
Total Protein: 5.5 g/dL — ABNORMAL LOW (ref 6.5–8.1)

## 2023-08-27 LAB — BRAIN NATRIURETIC PEPTIDE: B Natriuretic Peptide: 111.3 pg/mL — ABNORMAL HIGH (ref 0.0–100.0)

## 2023-08-27 LAB — BPAM RBC
Blood Product Expiration Date: 202503232359
ISSUE DATE / TIME: 202502261116
Unit Type and Rh: 6200

## 2023-08-27 LAB — GLUCOSE, CAPILLARY: Glucose-Capillary: 105 mg/dL — ABNORMAL HIGH (ref 70–99)

## 2023-08-27 LAB — C-REACTIVE PROTEIN: CRP: 7.7 mg/dL — ABNORMAL HIGH (ref <1.0)

## 2023-08-27 LAB — PROCALCITONIN: Procalcitonin: 0.28 ng/mL

## 2023-08-27 LAB — MAGNESIUM: Magnesium: 1.8 mg/dL (ref 1.7–2.4)

## 2023-08-27 NOTE — Progress Notes (Signed)
 5 Days Post-Op    Subjective: Loose Bms improving in frequency and becoming more soft. Tolerating soft diet without n/v and well controlled abdominal pain  Objective: Vital signs in last 24 hours: Temp:  [97.8 F (36.6 C)-98.1 F (36.7 C)] 98.1 F (36.7 C) (02/27 0400) Pulse Rate:  [81-95] 81 (02/27 0600) Resp:  [16-23] 19 (02/27 0600) BP: (121-147)/(78-89) 147/89 (02/27 0400) SpO2:  [94 %-99 %] 97 % (02/27 0600) Weight:  [94.2 kg] 94.2 kg (02/27 0451) Last BM Date : 08/26/23  Intake/Output from previous day: 02/26 0701 - 02/27 0700 In: 814.7 [P.O.:440; Blood:374.7] Out: 900 [Urine:900] Intake/Output this shift: No intake/output data recorded.  General appearance: alert and cooperative GI: soft. drain serous, midline Vac with good suction    Lab Results: CBC  Recent Labs    08/26/23 0237 08/27/23 0243  WBC 8.7 10.4  HGB 7.5* 8.7*  HCT 23.7* 27.5*  PLT 375 403*   BMET Recent Labs    08/26/23 0237 08/27/23 0243  NA 139 136  K 3.8 4.1  CL 110 107  CO2 22 21*  GLUCOSE 133* 104*  BUN 18 15  CREATININE 0.85 0.88  CALCIUM 7.9* 8.0*   PT/INR No results for input(s): "LABPROT", "INR" in the last 72 hours. ABG No results for input(s): "PHART", "HCO3" in the last 72 hours.  Invalid input(s): "PCO2", "PO2"  Studies/Results: No results found.   Anti-infectives: Anti-infectives (From admission, onward)    Start     Dose/Rate Route Frequency Ordered Stop   08/21/23 2200  piperacillin-tazobactam (ZOSYN) IVPB 3.375 g  Status:  Discontinued       Placed in "Followed by" Linked Group   3.375 g 12.5 mL/hr over 240 Minutes Intravenous Every 8 hours 08/20/23 1526 08/21/23 0638   08/21/23 0700  piperacillin-tazobactam (ZOSYN) IVPB 3.375 g       Placed in "Followed by" Linked Group   3.375 g 12.5 mL/hr over 240 Minutes Intravenous Every 8 hours 08/21/23 0638     08/20/23 1615  piperacillin-tazobactam (ZOSYN) IVPB 3.375 g       Placed in "Followed by" Linked  Group   3.375 g 100 mL/hr over 30 Minutes Intravenous  Once 08/20/23 1526 08/20/23 1614       Assessment/Plan: Small bowel perforation in setting of ileus related to norovirus s/p exp lap, small bowel resection for 2 separate areas of perforation, left in discontinuity, placement of abthera wound vac 2/20 by AL S/P ex lap, SB anastamosis and closure 2/22 by DB POD 7/5 - tolerating soft diet -  Hgb down to 8.7 from 7.5 after pRBC transfusion 2/26 - WBC normalized. stop IV Abx today once he has completed 5D postop - midline vac. Change M/TH - since placed Tuesday may change tomorrow. Orders placed for home vac -PT/OT  Stable for discharge home from surgical standpoint  FEN: soft, ensure ID: zosyn 2/20 VTE: lovenox    LOS: 14 days    Eric Form, [facility] 08/27/2023, 8:29 AM Please see Amion for pager number during day hours 7:00am-4:30pm   2/27/2025Patient ID: [name], male   DOB: 1946/05/11, 78 y.o.   MRN: [id number]

## 2023-08-27 NOTE — Progress Notes (Addendum)
 Physical Therapy Treatment and Discharge Patient Details Name: [name] MRN: [id number] DOB: 06/01/1946 Today's Date: 08/27/2023   History of Present Illness Pt is a 78 y/o M presenting to ED On 2/12 wtih abdominal pain, admitted for SBO vs enteritis, GI +norovirus. S/p ex lap, SB resection, wound vac placement on 2/20. Ex-lap with small bowel anastamosis and closure on 2/22. PMH includes HTN, HLD, SBO, back pain, prediabetes, osteoporosis, L TSA, R reverse shoulder arthroplasty, R TKA.    PT Comments  Patient moving independently for transfers, ambulation, and standing balance/strengthening exercises. Pt reports no concern re: getting up flight of stairs to his bedroom (actual stairs deferred due to enteric precautions, however pt able to high march x 20 reps and perform squats x 10 reps with no UE support or imbalance). Patient plans to discharge home 2/28. All goals met and discharge from PT.     If plan is discharge home, recommend the following:     Can travel by private vehicle        Equipment Recommendations  None recommended by PT    Recommendations for Other Services       Precautions / Restrictions Precautions Precautions: Fall Recall of Precautions/Restrictions: Intact Precaution/Restrictions Comments: abd wound, wound vac Restrictions Weight Bearing Restrictions Per Provider Order: No     Mobility  Bed Mobility               General bed mobility comments: up in chair on arrival and departure    Transfers Overall transfer level: Needs assistance Equipment used: None Transfers: Sit to/from Stand Sit to Stand: Independent                Ambulation/Gait Ambulation/Gait assistance: Independent Gait Distance (Feet): 550 Feet Assistive device: None Gait Pattern/deviations: WFL(Within Functional Limits)   Gait velocity interpretation: >4.37 ft/sec, indicative of normal walking speed   General Gait Details: with head turns, changes in  velocity   Stairs Stairs:  (simulated due to enteric precautions; see exercise)           Wheelchair Mobility     Tilt Bed    Modified Rankin (Stroke Patients Only)       Balance Overall balance assessment: Needs assistance Sitting-balance support: Feet supported, No upper extremity supported Sitting balance-Leahy Scale: Good     Standing balance support: During functional activity Standing balance-Leahy Scale: Normal Standing balance comment: see standing exercises                            Communication Communication Communication: No apparent difficulties  Cognition Arousal: Alert Behavior During Therapy: WFL for tasks assessed/performed   PT - Cognitive impairments: No apparent impairments                         Following commands: Intact      Cueing Cueing Techniques: Verbal cues  Exercises General Exercises - Lower Extremity Hip Flexion/Marching: AROM, Both, 20 reps, Standing (lifting feet >8"; no device) Mini-Sqauts: AROM, Both, 10 reps, Standing    General Comments        Pertinent Vitals/Pain Pain Assessment Pain Assessment: 0-10 Pain Score: 7  Pain Location: abdomen Pain Descriptors / Indicators: Discomfort Pain Intervention(s): Limited activity within patient's tolerance, Monitored during session, Repositioned, Patient requesting pain meds-RN notified    Home Living  Prior Function            PT Goals (current goals can now be found in the care plan section) Acute Rehab PT Goals Patient Stated Goal: to return to independence PT Goal Formulation: With patient Time For Goal Achievement: 09/04/23 Potential to Achieve Goals: Good Progress towards PT goals: Goals met/education completed, patient discharged from PT    Frequency    Min 1X/week      PT Plan      Co-evaluation              AM-PAC PT "6 Clicks" Mobility   Outcome Measure  Help needed turning from  your back to your side while in a flat bed without using bedrails?: None Help needed moving from lying on your back to sitting on the side of a flat bed without using bedrails?: None Help needed moving to and from a bed to a chair (including a wheelchair)?: None Help needed standing up from a chair using your arms (e.g., wheelchair or bedside chair)?: None Help needed to walk in hospital room?: None Help needed climbing 3-5 steps with a railing? : None 6 Click Score: 24    End of Session   Activity Tolerance: Patient tolerated treatment well Patient left: in chair;with call bell/phone within reach   PT Visit Diagnosis: Other abnormalities of gait and mobility (R26.89);Muscle weakness (generalized) (M62.81)   PT Discharge Note  Patient is being discharged from PT services secondary to:  Goals met and no further therapy needs identified.  Please see latest Therapy Progress Note for current level of functioning and progress toward goals.  Progress and discharge plan and discussed with patient/caregiver and they  Agree    Time: 5409-8119 PT Time Calculation (min) (ACUTE ONLY): 18 min  Charges:    $Gait Training: 8-22 mins PT General Charges $$ ACUTE PT VISIT: 1 Visit                      Jerolyn Center, PT Acute Rehabilitation Services  Office [phone]    Zena Amos 08/27/2023, 3:03 PM

## 2023-08-27 NOTE — Care Management Important Message (Signed)
 Important Message  Patient Details  Name: [name] MRN: [id number] Date of Birth: 1946-03-11   Important Message Given:  Yes - Medicare IM     [name] 08/27/2023, 12:38 PM

## 2023-08-27 NOTE — TOC Progression Note (Signed)
 Transition of Care [facility]) - Progression Note    Patient Details  Name: [name] MRN: [id number] Date of Birth: 1946-06-28  Transition of Care [facility]) CM/SW Contact  Gordy Clement, RN Phone Number: 08/27/2023, 12:24 PM  Clinical Narrative:     Wound Vac from [employer]/78M delivered bedside.  Patient states Wife will transport home tomorrow           Expected Discharge Plan and Services       Living arrangements for the past 2 months: Single Family Home                                       Social Determinants of Health (SDOH) Interventions SDOH Screenings   Food Insecurity: No Food Insecurity (08/12/2023)  Housing: Low Risk  (08/12/2023)  Transportation Needs: No Transportation Needs (08/12/2023)  Utilities: Not At Risk (08/12/2023)  Alcohol Screen: Low Risk  (05/05/2023)  Depression (PHQ2-9): Low Risk  (05/14/2023)  Financial Resource Strain: Low Risk  (05/05/2023)  Physical Activity: Sufficiently Active (05/05/2023)  Social Connections: Socially Integrated (08/12/2023)  Stress: No Stress Concern Present (05/05/2023)  Tobacco Use: Low Risk  (08/22/2023)  Health Literacy: Adequate Health Literacy (05/05/2023)    Readmission Risk Interventions     No data to display

## 2023-08-27 NOTE — Progress Notes (Cosign Needed)
 Student Mueller working with [name], I assumed care for [name] around [phone]. [name] was admitted due to a  small bowel obstruction and underwent a bowel resection with a wound vac placement.   [name] provided peri care and a bath and [name] performed oral care. 0800 and 1000 medication were administered along with a head to toe assessment. Documentation provided in the chart. Pain medication was administered due to [name] pain being a 8/10. Will reassess pain in an hour. [name] drained [name] JP drain and 40 mL was the amount of drainage. Patient sitting in chair with blanket. Call light within reach and knows to use the call bell if he needs anything.  Pt ambulated to the bathroom. Pt had bowel movement. Cleaned up and ambulated back to the chair. Call light within reach. Bedside table and water within reach.   Pt complained of abdominal pain 8/10. Pain medication given. Will reassess in 1 hour.   Pain reassessed.  Pt is feeling better. Pain is currently 4/10. Ambulated to bathroom and had a bowel movement. Pt ambulated back to the chair. Call light within reach.

## 2023-08-27 NOTE — Discharge Instructions (Addendum)
 Continue Monday/Thursday wound vac dressing changes with home health.  CCS      [facility], [place] [phone]  OPEN ABDOMINAL SURGERY: POST OP INSTRUCTIONS  Always review your discharge instruction sheet given to you by the facility where your surgery was performed.  IF YOU HAVE DISABILITY OR FAMILY LEAVE FORMS, YOU MUST BRING THEM TO THE OFFICE FOR PROCESSING.  PLEASE DO NOT GIVE THEM TO YOUR DOCTOR.  A prescription for pain medication may be given to you upon discharge.  Take your pain medication as prescribed, if needed.  If narcotic pain medicine is not needed, then you may take acetaminophen (Tylenol) or ibuprofen (Advil) as needed. Take your usually prescribed medications unless otherwise directed. If you need a refill on your pain medication, please contact your pharmacy. They will contact our office to request authorization.  Prescriptions will not be filled after 5pm or on week-ends. You should follow a light diet the first few days after arrival home, such as soup and crackers, pudding, etc.unless your doctor has advised otherwise. A high-fiber, low fat diet can be resumed as tolerated.   Be sure to include lots of fluids daily. Most patients will experience some swelling and bruising on the chest and neck area.  Ice packs will help.  Swelling and bruising can take several days to resolve Most patients will experience some swelling and bruising in the area of the incision. Ice pack will help. Swelling and bruising can take several days to resolve..  It is common to experience some constipation if taking pain medication after surgery.  Increasing fluid intake and taking a stool softener will usually help or prevent this problem from occurring.  A mild laxative (Milk of Magnesia or Miralax) should be taken according to package directions if there are no bowel movements after 48 hours.  You may have steri-strips (small skin tapes) in place directly over the incision.  These strips  should be left on the skin for 7-10 days.  If your surgeon used skin glue on the incision, you may shower in 24 hours.  The glue will flake off over the next 2-3 weeks.  Any sutures or staples will be removed at the office during your follow-up visit. You may find that a light gauze bandage over your incision may keep your staples from being rubbed or pulled. You may shower and replace the bandage daily. ACTIVITIES:  You may resume regular (light) daily activities beginning the next day--such as daily self-care, walking, climbing stairs--gradually increasing activities as tolerated.  You may have sexual intercourse when it is comfortable.  Refrain from any heavy lifting or straining until approved by your doctor. You may drive when you no longer are taking prescription pain medication, you can comfortably wear a seatbelt, and you can safely maneuver your car and apply brakes Return to Work: ___________________________________ [name] should see your doctor in the office for a follow-up appointment approximately two weeks after your surgery.  Make sure that you call for this appointment within a day or two after you arrive home to insure a convenient appointment time. OTHER INSTRUCTIONS:  _____________________________________________________________ _____________________________________________________________  WHEN TO CALL YOUR DOCTOR: Fever over 101.0 Inability to urinate Nausea and/or vomiting Extreme swelling or bruising Continued bleeding from incision. Increased pain, redness, or drainage from the incision. Difficulty swallowing or breathing Muscle cramping or spasms. Numbness or tingling in hands or feet or around lips.  The clinic staff is available to answer your questions during regular business hours.  Please don't  hesitate to call and ask to speak to one of the nurses if you have concerns.  For further questions, please visit [email or web address]    Follow the instructions  provided by general surgery regarding your postop care, drain site care, wound VAC care.  Follow with Primary MD Bradd Canary, MD in 7 days   Get CBC, CMP, magnesium-  checked next visit with your primary MD   Activity: As tolerated with Full fall precautions use walker/cane & assistance as needed  Disposition Home    Diet: Soft diet for the next 3 to 4 days then advance to regular consistency heart healthy diet as tolerated  Special Instructions: If you have smoked or chewed Tobacco  in the last 2 yrs please stop smoking, stop any regular Alcohol  and or any Recreational drug use.  On your next visit with your primary care physician please Get Medicines reviewed and adjusted.  Please request your Prim.MD to go over all Hospital Tests and Procedure/Radiological results at the follow up, please get all Hospital records sent to your Prim MD by signing hospital release before you go home.  If you experience worsening of your admission symptoms, develop shortness of breath, life threatening emergency, suicidal or homicidal thoughts you must seek medical attention immediately by calling 911 or calling your MD immediately  if symptoms less severe.  You Must read complete instructions/literature along with all the possible adverse reactions/side effects for all the Medicines you take and that have been prescribed to you. Take any new Medicines after you have completely understood and accpet all the possible adverse reactions/side effects.   Do not drive when taking Pain medications.  Do not take more than prescribed Pain, Sleep and Anxiety Medications

## 2023-08-27 NOTE — Progress Notes (Addendum)
 PROGRESS NOTE                                                                                                                                                                                                             Patient Demographics:    [name], is a 78 y.o. male, DOB - Aug 03, 1945, ZOX:[id number]  Outpatient Primary MD for the patient is Bradd Canary, MD    LOS - 14  Admit date - 08/12/2023    Chief Complaint  Patient presents with   Abdominal Pain       Brief Narrative (HPI from H&P)   78 yo male with past medical history of hypertension, dyslipidemia, small bowel obstruction who was admitted to hospital on 08/12/2023 due to norovirus gastroenteritis complicated by small bowel obstruction, failed conservative treatment, was seen by general surgery, failed conservative management, developed pneumoperitoneum due to small bowel perforation requiring surgical intervention on 08/20/2023, he was seen by PCCM, transferred to my service on 08/24/2023 on day 11 of hospital stay still has NG tube, abdominal JP drain, PICC line with TNA.   Significant Hospital Events:  Including procedures   Admitted 2/12 Surgery consulted 2./12 due to ? Sbo Gi consulted 2/17 due to persistent gi dysmotility OR with surgery 2/20, left open for further eval and admitted to ICU.  Small bowel perforation found in the jejunum. OR 2/22 reanastomosis and fascia closure.   Subjective:   Patient in bed, appears comfortable, denies any headache, no fever, no chest pain or pressure, no shortness of breath , no abdominal pain. No focal weakness.   Assessment  & Plan :   Small bowel obstruction complicated with small bowel perforation, pneumoperitoneum. Seen by GI, general surgery and PCCM, underwent surgical intervention on 08/20/2023, reanastomosis on 08/22/2023, he is still on IV Zosyn most likely being stopped on 08/27/2023 per surgery surgery,  G-tube removed 08/24/2023, TNA has been discontinued tolerating oral diet with vigorous bowel function, wound VAC for home ordered.  If stable likely discharge on 08/28/2023.  Hypretension.  As needed IV hydralazine and Lopressor.  Dyslipidemia and BPH.  Resume statin and alpha-blocker once he is taking oral medications and diet reliably.  Anemia of acute illness.  No signs of active bleeding 1 unit of packed RBC on 08/25/2022.  Asymptomatic transaminitis.  Likely due to TNA, stopped 08/26/2023,  trend improving, monitor 1 more day.,      Condition - Fair  Family Communication  :  None  Code Status :  Full  Consults  :  CCS, PCCM  PUD Prophylaxis : PPI   Procedures  :            Disposition Plan  :    Status is: Inpatient  DVT Prophylaxis  :    enoxaparin (LOVENOX) injection 40 mg Start: 08/23/23 1300  Lab Results  Component Value Date   PLT 403 (H) 08/27/2023    Diet :  Diet Order             DIET SOFT Room service appropriate? Yes; Fluid consistency: Thin  Diet effective now                    Inpatient Medications  Scheduled Meds:  Chlorhexidine Gluconate Cloth  6 each Topical Daily   enoxaparin (LOVENOX) injection  40 mg Subcutaneous Q24H   feeding supplement  237 mL Oral BID BM   melatonin  5 mg Oral QHS   pantoprazole (PROTONIX) IV  40 mg Intravenous Q24H   sodium chloride flush  10-40 mL Intracatheter Q12H   Continuous Infusions:  piperacillin-tazobactam (ZOSYN)  IV 3.375 g (08/27/23 0900)   PRN Meds:.acetaminophen, fentaNYL (SUBLIMAZE) injection, hydrALAZINE, ketorolac, metoprolol tartrate, ondansetron (ZOFRAN) IV, mouth rinse, oxyCODONE, phenol, zolpidem    Objective:   Vitals:   08/27/23 0400 08/27/23 0451 08/27/23 0500 08/27/23 0600  BP: (!) 147/89     Pulse: 82 81 82 81  Resp: 18 19 19 19   Temp: 98.1 F (36.7 C)     TempSrc: Oral     SpO2: 95% 96% 96% 97%  Weight:  94.2 kg    Height:        Wt Readings from Last 3 Encounters:   08/27/23 94.2 kg  08/11/23 97.1 kg  05/14/23 93.9 kg     Intake/Output Summary (Last 24 hours) at 08/27/2023 1045 Last data filed at 08/27/2023 0931 Gross per 24 hour  Intake 574.67 ml  Output 1390 ml  Net -815.33 ml     Physical Exam  Awake Alert, No new F.N deficits, right arm PICC line, abdominal JP drain [place].AT,PERRAL Supple Neck, No JVD,   Symmetrical Chest wall movement, Good air movement bilaterally, CTAB RRR,No Gallops,Rubs or new Murmurs,  +ve B.Sounds, Abd Soft, No tenderness,   No Cyanosis, Clubbing or edema      Data Review:    Recent Labs  Lab 08/21/23 0824 08/23/23 0514 08/24/23 0343 08/25/23 0238 08/26/23 0237 08/27/23 0243  WBC 16.5* 17.0* 11.2* 9.6 8.7 10.4  HGB 12.6* 9.9* 8.2* 8.0* 7.5* 8.7*  HCT 37.0* 30.1* 25.0* 24.7* 23.7* 27.5*  PLT 390 415* 373 408* 375 403*  MCV 81.9 84.8 86.8 87.0 88.8 87.3  MCH 27.9 27.9 28.5 28.2 28.1 27.6  MCHC 34.1 32.9 32.8 32.4 31.6 31.6  RDW 13.1 13.0 13.3 13.6 13.5 13.8  LYMPHSABS 0.6*  --  0.4* 1.3 1.1 1.3  MONOABS 0.6  --  0.6 0.7 0.6 0.7  EOSABS 0.0  --  0.3 0.2 0.2 0.1  BASOSABS 0.0  --  0.0 0.0 0.0 0.0    Recent Labs  Lab 08/21/23 0457 08/22/23 0412 08/23/23 0514 08/24/23 0343 08/26/23 0237 08/27/23 0243  NA 132* 133* 133* 137 139 136  K 4.6 3.8 4.1 3.7 3.8 4.1  CL 94* 95* 97* 107 110 107  CO2 24 26 25  24  22 21*  ANIONGAP 14 12 11 6 7 8   GLUCOSE 140* 159* 129* 121* 133* 104*  BUN 19 21 17 19 18 15   CREATININE 1.25* 1.28* 1.05 0.96 0.85 0.88  AST 20  --   --  19 288* 95*  ALT 29  --   --  20 323* 230*  ALKPHOS 55  --   --  44 84 83  BILITOT 1.3*  --   --  0.3 0.4 0.6  ALBUMIN 2.4*  --   --  <1.5* 1.6* 1.7*  CRP  --   --   --   --  7.9* 7.7*  PROCALCITON  --   --   --   --  0.42 0.28  BNP  --   --   --   --  310.5* 111.3*  MG 2.0 2.2 2.1 1.9 1.9 1.8  PHOS 4.5 2.4* 2.7 2.6 3.2 3.1  CALCIUM 8.4* 8.4* 8.1* 7.6* 7.9* 8.0*    Micro Results Recent Results (from the past 240 hours)  MRSA Next  Gen by PCR, Nasal     Status: None   Collection Time: 08/20/23 10:14 PM   Specimen: Nasal Mucosa; Nasal Swab  Result Value Ref Range Status   MRSA by PCR Next Gen NOT DETECTED NOT DETECTED Final    Comment: (NOTE) The GeneXpert MRSA Assay (FDA approved for NASAL specimens only), is one component of a comprehensive MRSA colonization surveillance program. It is not intended to diagnose MRSA infection nor to guide or monitor treatment for MRSA infections. Test performance is not FDA approved in patients less than 72 years old. Performed at [facility], [address] [place], [place] [address]     Radiology Report No results found.   Signature  -   Susa Raring M.D on 08/27/2023 at 10:45 AM   -  To page go to [email or web address]

## 2023-08-27 NOTE — Plan of Care (Signed)
 Pt has rested quietly throughout the night with no distress noted. Alert and oriented. ON room air. SR on the monitor. Right upper arm 2L PICC intact with dressing CDI. Medicated for pain once with relief noted. Voids per urinal. Had one BM. No other complaints voiced.     Problem: Clinical Measurements: Goal: Respiratory complications will improve Outcome: Progressing   Problem: Activity: Goal: Risk for activity intolerance will decrease Outcome: Progressing   Problem: Coping: Goal: Level of anxiety will decrease Outcome: Progressing   Problem: Elimination: Goal: Will not experience complications related to bowel motility Outcome: Progressing Goal: Will not experience complications related to urinary retention Outcome: Progressing   Problem: Pain Managment: Goal: General experience of comfort will improve and/or be controlled Outcome: Progressing   Problem: Coping: Goal: Ability to adjust to condition or change in health will improve Outcome: Progressing   Problem: Fluid Volume: Goal: Ability to maintain a balanced intake and output will improve Outcome: Progressing   Problem: Nutritional: Goal: Maintenance of adequate nutrition will improve Outcome: Progressing

## 2023-08-28 ENCOUNTER — Other Ambulatory Visit (HOSPITAL_COMMUNITY): Payer: Self-pay

## 2023-08-28 LAB — CBC WITH DIFFERENTIAL/PLATELET
Abs Immature Granulocytes: 0.13 10*3/uL — ABNORMAL HIGH (ref 0.00–0.07)
Basophils Absolute: 0 10*3/uL (ref 0.0–0.1)
Basophils Relative: 0 %
Eosinophils Absolute: 0.1 10*3/uL (ref 0.0–0.5)
Eosinophils Relative: 1 %
HCT: 27.3 % — ABNORMAL LOW (ref 39.0–52.0)
Hemoglobin: 9 g/dL — ABNORMAL LOW (ref 13.0–17.0)
Immature Granulocytes: 1 %
Lymphocytes Relative: 12 %
Lymphs Abs: 1.3 10*3/uL (ref 0.7–4.0)
MCH: 28.5 pg (ref 26.0–34.0)
MCHC: 33 g/dL (ref 30.0–36.0)
MCV: 86.4 fL (ref 80.0–100.0)
Monocytes Absolute: 0.7 10*3/uL (ref 0.1–1.0)
Monocytes Relative: 7 %
Neutro Abs: 8.5 10*3/uL — ABNORMAL HIGH (ref 1.7–7.7)
Neutrophils Relative %: 79 %
Platelets: 411 10*3/uL — ABNORMAL HIGH (ref 150–400)
RBC: 3.16 MIL/uL — ABNORMAL LOW (ref 4.22–5.81)
RDW: 13.6 % (ref 11.5–15.5)
WBC: 10.8 10*3/uL — ABNORMAL HIGH (ref 4.0–10.5)
nRBC: 0 % (ref 0.0–0.2)

## 2023-08-28 LAB — COMPREHENSIVE METABOLIC PANEL
ALT: 169 U/L — ABNORMAL HIGH (ref 0–44)
AST: 46 U/L — ABNORMAL HIGH (ref 15–41)
Albumin: 1.8 g/dL — ABNORMAL LOW (ref 3.5–5.0)
Alkaline Phosphatase: 85 U/L (ref 38–126)
Anion gap: 7 (ref 5–15)
BUN: 12 mg/dL (ref 8–23)
CO2: 23 mmol/L (ref 22–32)
Calcium: 8 mg/dL — ABNORMAL LOW (ref 8.9–10.3)
Chloride: 103 mmol/L (ref 98–111)
Creatinine, Ser: 0.76 mg/dL (ref 0.61–1.24)
GFR, Estimated: >60 mL/min (ref >60)
Glucose, Bld: 113 mg/dL — ABNORMAL HIGH (ref 70–99)
Potassium: 3.9 mmol/L (ref 3.5–5.1)
Sodium: 133 mmol/L — ABNORMAL LOW (ref 135–145)
Total Bilirubin: 0.5 mg/dL (ref 0.0–1.2)
Total Protein: 5.7 g/dL — ABNORMAL LOW (ref 6.5–8.1)

## 2023-08-28 LAB — MAGNESIUM: Magnesium: 1.8 mg/dL (ref 1.7–2.4)

## 2023-08-28 LAB — BRAIN NATRIURETIC PEPTIDE: B Natriuretic Peptide: 66.4 pg/mL (ref 0.0–100.0)

## 2023-08-28 LAB — C-REACTIVE PROTEIN: CRP: 9.5 mg/dL — ABNORMAL HIGH (ref <1.0)

## 2023-08-28 LAB — PHOSPHORUS: Phosphorus: 3 mg/dL (ref 2.5–4.6)

## 2023-08-28 LAB — PROCALCITONIN: Procalcitonin: 0.25 ng/mL

## 2023-08-28 MED ORDER — DOCUSATE SODIUM 100 MG PO CAPS
100.0000 mg | ORAL_CAPSULE | Freq: Two times a day (BID) | ORAL | 0 refills | Status: AC | PRN
  Filled 2023-08-28: qty 30, 15d supply, fill #0

## 2023-08-28 NOTE — Discharge Summary (Signed)
 [name] EAV:[id number] DOB: February 22, 1946 DOA: 08/12/2023  PCP: Bradd Canary, MD  Admit date: 08/12/2023  Discharge date: 08/28/2023  Admitted From: Home   Disposition:  Home   Recommendations for Outpatient Follow-up:   Follow up with PCP in 1-2 weeks  PCP Please obtain BMP/CBC, 2 view CXR in 1week,  (see Discharge instructions)   PCP Please follow up on the following pending results:    Home Health: RN if qualifies   Equipment/Devices: W Vac  Consultations: CCS Discharge Condition: Stable    CODE STATUS: Full    Diet Recommendation: Soft diet    Chief Complaint  Patient presents with   Abdominal Pain     Brief history of present illness from the day of admission and additional interim summary    78 yo male with past medical history of hypertension, dyslipidemia, small bowel obstruction who was admitted to hospital on 08/12/2023 due to norovirus gastroenteritis complicated by small bowel obstruction, failed conservative treatment, was seen by general surgery, failed conservative management, developed pneumoperitoneum due to small bowel perforation requiring surgical intervention on 08/20/2023, he was seen by PCCM, transferred to my service on 08/24/2023 on day 11 of hospital stay still has NG tube, abdominal JP drain, PICC line with TNA.     Significant Hospital Events:  Including procedures   Admitted 2/12 Surgery consulted 2./12 due to ? Sbo Gi consulted 2/17 due to persistent gi dysmotility OR with surgery 2/20, left open for further eval and admitted to ICU.  Small bowel perforation found in the jejunum. OR 2/22 reanastomosis and fascia closure.                                                                 Hospital Course   Small bowel obstruction complicated with small bowel perforation,  pneumoperitoneum. Seen by GI, general surgery and PCCM, underwent surgical intervention on 08/20/2023, reanastomosis on 08/22/2023, finished 5 days of antibiotics postop, symptom-free now with good bowel activity, cleared for discharge per general surgery with abdominal wound VAC, he also has a postop drain in his abdomen which general surgery will remove prior to his discharge.  Surgery will provide instructions on postop care, wound VAC care and tube site care.  This was communicated by me clearly to the floor nurse and general surgery team multiple times.  Patient is currently symptom-free post discharge will follow-up with PCP and general surgery.  Hypretension.  Will resume home medications postdischarge.   Dyslipidemia and BPH.  Resume statin and alpha-blocker once he is taking oral medications and diet reliably.   Anemia of acute illness.  No signs of active bleeding 1 unit of packed RBC on 08/25/2022.   Asymptomatic transaminitis.  Likely due to TNA, stopped 08/26/2023, trend improved.  PCP to recheck in 7 to 10 days.  Discharge diagnosis     Principal Problem:   Ileus (HCC) Active Problems:   SBO (small bowel obstruction) (HCC)   Malnutrition of moderate degree    Discharge instructions    Discharge Instructions     Discharge instructions   Complete by: As directed    Follow the instructions provided by general surgery regarding your postop care, drain site care, wound VAC care.  Follow with Primary MD Bradd Canary, MD in 7 days   Get CBC, CMP, magnesium-  checked next visit with your primary MD   Activity: As tolerated with Full fall precautions use walker/cane & assistance as needed  Disposition Home    Diet: Soft diet for the next 3 to 4 days then advance to regular consistency heart healthy diet as tolerated  Special Instructions: If you have smoked or chewed Tobacco  in the last 2 yrs please stop smoking, stop any regular Alcohol  and or any Recreational drug  use.  On your next visit with your primary care physician please Get Medicines reviewed and adjusted.  Please request your Prim.MD to go over all Hospital Tests and Procedure/Radiological results at the follow up, please get all Hospital records sent to your Prim MD by signing hospital release before you go home.  If you experience worsening of your admission symptoms, develop shortness of breath, life threatening emergency, suicidal or homicidal thoughts you must seek medical attention immediately by calling 911 or calling your MD immediately  if symptoms less severe.  You Must read complete instructions/literature along with all the possible adverse reactions/side effects for all the Medicines you take and that have been prescribed to you. Take any new Medicines after you have completely understood and accpet all the possible adverse reactions/side effects.   Do not drive when taking Pain medications.  Do not take more than prescribed Pain, Sleep and Anxiety Medications   Discharge wound care:   Complete by: As directed    Finally follow instructions given by general surgery on drain site care, wound VAC care and postop care.   Increase activity slowly   Complete by: As directed        Discharge Medications   Allergies as of 08/28/2023   No Known Allergies      Medication List     STOP taking these medications    ibuprofen 800 MG tablet Commonly known as: ADVIL       TAKE these medications    acetaminophen 325 MG tablet Commonly known as: TYLENOL Take 1-2 tablets (325-650 mg total) by mouth every 6 (six) hours as needed for mild pain (pain score 1-3 or temp > 100.5).   ALPRAZolam 0.5 MG tablet Commonly known as: XANAX Take 0.5 mg by mouth at bedtime as needed for anxiety or sleep.   amLODipine 10 MG tablet Commonly known as: NORVASC TAKE 1 TABLET DAILY   aspirin 81 MG chewable tablet Chew 1 tablet (81 mg total) by mouth 2 (two) times daily.   benazepril 40 MG  tablet Commonly known as: LOTENSIN Take 20 mg by mouth daily.   Cholecalciferol 25 MCG (1000 UT) capsule Take 1,000 Units by mouth daily.   docusate sodium 100 MG capsule Commonly known as: Colace Take 1 capsule (100 mg total) by mouth 2 (two) times daily as needed for mild constipation.   hydrocortisone 25 MG suppository Commonly known as: ANUSOL-HC Place 25 mg rectally 2 (two) times daily as needed for anal itching.   Iron (Ferrous Sulfate) 325 (65  Fe) MG Tabs Take 325 mg by mouth daily.   losartan 50 MG tablet Commonly known as: COZAAR Take 1 tablet (50 mg total) by mouth daily.   metoprolol succinate 50 MG 24 hr tablet Commonly known as: TOPROL-XL TAKE AS INSTRUCTED BY YOUR PRESCRIBER What changed: See the new instructions.   MULTIVITAMIN ADULT PO Take 2 tablets by mouth daily.   rosuvastatin 20 MG tablet Commonly known as: CRESTOR Take 1 tablet (20 mg total) by mouth daily.   sildenafil 100 MG tablet Commonly known as: VIAGRA Take 100 mg by mouth as needed for erectile dysfunction (1 hour prior to intercourse).   tamsulosin 0.4 MG Caps capsule Commonly known as: FLOMAX Take 0.4 mg by mouth daily.   traMADol 50 MG tablet Commonly known as: ULTRAM Take 50 mg by mouth every 6 (six) hours as needed for moderate pain (pain score 4-6).               Durable Medical Equipment  (From admission, onward)           Start     Ordered   08/26/23 1204  For home use only DME Negative pressure wound device  Once       Question Answer Comment  Frequency of dressing change 2 times per week   Length of need 3 Months   Dressing type Foam   Amount of suction 125 mm/Hg   Pressure application Continuous pressure   Supplies 10 canisters and 15 dressings per month for duration of therapy      08/26/23 1203              Discharge Care Instructions  (From admission, onward)           Start     Ordered   08/28/23 0000  Discharge wound care:        Comments: Finally follow instructions given by general surgery on drain site care, wound VAC care and postop care.   08/28/23 0744             Follow-up Information     [facility].   Specialty: Home Health Services Why: [facility] will contact you within 48 hours of DC for a home health visit Contact information: [address] [place] [address] [phone]         Bradd Canary, MD. Schedule an appointment as soon as possible for a visit in 1 week(s).   Specialty: Family Medicine Contact information: [address] [place] [address] [phone]         [facility], [place]. Schedule an appointment as soon as possible for a visit in 1 week(s).   Specialty: General Surgery Contact information: [address] [place] [address] [phone]                Major procedures and Radiology Reports - PLEASE review detailed and final reports thoroughly  -        [place] EKG SITE RITE Result Date: 08/21/2023 If [facility] image not attached, placement could not be confirmed due to current cardiac rhythm.  DG Abd 1 View Result Date: 08/20/2023 CLINICAL DATA:  244010 NG (nasogastric) tube fed newborn [id number] EXAM: ABDOMEN - 1 VIEW COMPARISON:  X-ray abdomen 08/20/2023 12:19 p.m. FINDINGS: Enteric tube coursing below the hemidiaphragm with tip and side port overlying the expected region of the gastric lumen. Surgical drain overlying the left mid  abdomen. Interval resolution of small bowel gaseous dilatation. No radio-opaque calculi or other significant radiographic abnormality are seen. IMPRESSION: Enteric tube in good position. Electronically Signed   By: Tish Frederickson M.D.   On: 08/20/2023 21:09   CT ABDOMEN PELVIS WO CONTRAST Result Date: 08/20/2023 CLINICAL DATA:  Peritonitis or perforation suspected. Patient presented with diffuse bowel distension and head  radiographs today concerning for pneumoperitoneum. EXAM: CT ABDOMEN AND PELVIS WITHOUT CONTRAST TECHNIQUE: Multidetector CT imaging of the abdomen and pelvis was performed following the standard protocol without IV contrast. RADIATION DOSE REDUCTION: This exam was performed according to the departmental dose-optimization program which includes automated exposure control, adjustment of the mA and/or kV according to patient size and/or use of iterative reconstruction technique. COMPARISON:  Radiographs earlier the same date. Abdominopelvic CT 08/19/2023 and 08/11/2023. FINDINGS: Lower chest: Trace right pleural effusion with mild dependent atelectasis at both lung bases. No basilar pneumothorax. Hepatobiliary: No focal hepatic abnormalities are identified on noncontrast imaging. Hepatic low density suggesting steatosis. No evidence of gallstones, gallbladder wall thickening or biliary dilatation. Pancreas: Unremarkable. No pancreatic ductal dilatation or surrounding inflammatory changes. Spleen: Normal in size without focal abnormality. Adrenals/Urinary Tract: Both adrenal glands appear normal. Unchanged low-density and hyperdense renal cysts bilaterally for which no specific follow-up imaging recommended. No evidence of urinary tract calculus, hydronephrosis or perinephric soft tissue stranding. The bladder appears unremarkable for its degree of distention. Stomach/Bowel: Enteric tube projects into the distal stomach. There is mildly improved gastric distension. There is persistent and slightly increased dilatation of the jejunum and proximal ileum with decompression of the distal small bowel. The colon is not distended. There are diverticular changes within the descending and sigmoid colon. The appendix appears normal. Vascular/Lymphatic: No enlarged abdominopelvic lymph nodes are identified. There is scattered aortic and branch vessel atherosclerosis without evidence of aneurysm. Reproductive: Stable mild  enlargement of the prostate gland. Other: As suspected on earlier radiographs, there is moderate pneumoperitoneum with scattered air bubbles throughout the porta hepatis and omentum. There is a small amount of ascites within the right lower quadrant and pelvis. Findings are consistent with interval bowel or gastric perforation. The specific site of the perforation is not demonstrated by this examination performed without enteric and intravenous contrast. Musculoskeletal: No acute or significant osseous findings. Multilevel spondylosis with mild degenerative changes at both hips. IMPRESSION: 1. Moderate pneumoperitoneum and small amount of ascites consistent with interval bowel or gastric perforation. The specific site of the perforation is not demonstrated by this examination performed without enteric and intravenous contrast. Surgical evaluation warranted. 2. Persistent and slightly increased dilatation of the jejunum and proximal ileum with decompression of the distal small bowel. Findings remain suspicious for small bowel obstruction. 3. Enteric tube projects into the distal stomach. 4. Trace right pleural effusion with mild dependent atelectasis at both lung bases. 5. Aortic atherosclerosis. 6. These results will be called to the ordering clinician or representative by the Radiologist Assistant, and communication documented in the PACS or [employer]. Electronically Signed   By: Carey Bullocks M.D.   On: 08/20/2023 15:42   DG Abd Portable 1V Result Date: 08/20/2023 CLINICAL DATA:  Abdominal distension.  Nasogastric tube placement. EXAM: PORTABLE ABDOMEN - 1 VIEW COMPARISON:  Abdominopelvic CT and radiographs 08/19/2023. FINDINGS: 1219 hours. Single portable supine view of the abdomen demonstrates the tip of an enteric tube overlying the proximal stomach. The stomach remains mildly distended. There are persistent dilated loops of small bowel. There is suspicion of new extraluminal air in  the right upper  quadrant and left mid abdomen. In the absence of interval abdominal surgery, findings are concerning for possible bowel perforation. IMPRESSION: 1. Enteric tube projects over the proximal stomach. 2. Persistent small bowel distension with concern for pneumoperitoneum. In the absence of interval surgery, findings are concerning for possible bowel perforation. Recommend general surgery consultation. Follow-up CT may be helpful for further evaluation. 3. These results will be called to the ordering clinician or representative by the Radiologist Assistant, and communication documented in the PACS or [employer]. Electronically Signed   By: Carey Bullocks M.D.   On: 08/20/2023 12:51   CT ABDOMEN PELVIS WO CONTRAST Result Date: 08/19/2023 CLINICAL DATA:  Abdominal pain, post-op Bowel obstruction suspected EXAM: CT ABDOMEN AND PELVIS WITHOUT CONTRAST TECHNIQUE: Multidetector CT imaging of the abdomen and pelvis was performed following the standard protocol without IV contrast. RADIATION DOSE REDUCTION: This exam was performed according to the departmental dose-optimization program which includes automated exposure control, adjustment of the mA and/or kV according to patient size and/or use of iterative reconstruction technique. COMPARISON:  Preoperative CT 08/11/2023 FINDINGS: Lower chest: Small right pleural effusion. Hepatobiliary: Unremarkable unenhanced appearance. Gallbladder physiologically distended, no calcified stone. No biliary dilatation. Pancreas: Unremarkable unenhanced appearance. Spleen: Normal in size without focal abnormality. Adrenals/Urinary Tract: No adrenal nodule. No hydronephrosis. Bilateral hypodense and hyperdense renal lesions, better characterized on prior contrast-enhanced exam. Unremarkable urinary bladder. Stomach/Bowel: Moderate gaseous gastric distension. Small bowel is diffusely dilated and fluid-filled. There is gradual transition from dilated to nondilated in the distal ileum. No  specific transition point. Scattered foci of small bowel gas all appears to be non dependent and likely represents air within the bowel rather than pneumatosis. Moderate edema within the small bowel mesentery. There is also diffuse distension of the ascending and transverse colon with air and fluid. The left colon is less dilated. No wall thickening or abnormality in the area of transition. Left colonic diverticulosis without diverticulitis. Normal appendix. Vascular/Lymphatic: Aortic atherosclerosis. No aneurysm. There is no portal venous or mesenteric gas. No bulky adenopathy. Reproductive: Enlarged prostate again seen. Other: Small amount of free fluid in the small bowel mesentery and dependent pelvis. No abscess. No free air. No abdominal wall collection. Musculoskeletal: Multilevel degenerative change throughout the spine. Moderate bilateral hip osteoarthritis. There are no acute or suspicious osseous abnormalities. IMPRESSION: 1. Diffusely dilated and fluid-filled small bowel with gradual transition from dilated to nondilated in the distal ileum. No specific transition point. There is also diffuse distension of the ascending and transverse colon with air and fluid. Findings are favored to represent ileus. 2. Scattered foci of gas throughout the small bowel appears to be non dependent and within the bowel lumen rather than pneumatosis. 3. Small amount of free fluid in the small bowel mesentery and dependent pelvis. No abscess. No free air. 4. Small right pleural effusion. 5. Bilateral hypodense and hyperdense renal lesions, better characterized on prior contrast-enhanced exam. Electronically Signed   By: Narda Rutherford M.D.   On: 08/19/2023 16:22   DG Abd Portable 1V Result Date: 08/19/2023 CLINICAL DATA:  Ileus.  Abdominal pain. EXAM: PORTABLE ABDOMEN - 1 VIEW COMPARISON:  August 18, 2023. FINDINGS: Stable small bowel dilatation.  No definite colonic dilatation. IMPRESSION: Stable small bowel dilatation  concerning for possible obstruction or ileus. Electronically Signed   By: Lupita Raider M.D.   On: 08/19/2023 14:10   DG Abd 1 View Result Date: 08/18/2023 CLINICAL DATA:  Follow-up ileus EXAM: ABDOMEN - 1 VIEW  COMPARISON:  08/16/2023 FINDINGS: Persistent small bowel dilatation is noted. It may be slightly greater than that seen on the prior exam. No free air is noted. No bony abnormality is seen. IMPRESSION: Slight increase in the degree of small-bowel dilatation. Electronically Signed   By: Alcide Clever M.D.   On: 08/18/2023 18:57   DG Abd Portable 1V-Small Bowel Obstruction Protocol-initial, 8 hr delay Result Date: 08/16/2023 CLINICAL DATA:  8 hour small-bowel follow-up exam EXAM: PORTABLE ABDOMEN - 1 VIEW COMPARISON:  Film from the previous day. FINDINGS: Scattered large and small bowel gas is noted. Persistent small bowel dilatation is seen although mildly improved when compared with the prior study. Contrast material is noted within the colon IMPRESSION: Persistent but improved small bowel dilatation. Administered contrast lies within the colon consistent with a partial small bowel obstruction. Electronically Signed   By: Alcide Clever M.D.   On: 08/16/2023 01:09   DG Abd Portable 1V Result Date: 08/15/2023 CLINICAL DATA:  Abdominal distension and small-bowel ileus/obstruction. EXAM: PORTABLE ABDOMEN - 1 VIEW COMPARISON:  08/13/2023 FINDINGS: There is some increased prominence of dilatation of central small bowel loops. Small amount of air remains in the colon. Findings support small-bowel obstruction. No gross signs of free air or pneumatosis. IMPRESSION: Increased prominence of dilatation of central small bowel loops. Findings support small-bowel obstruction. Electronically Signed   By: Irish Lack M.D.   On: 08/15/2023 13:13   DG Abd Portable 1V Result Date: 08/13/2023 CLINICAL DATA:  [id number] Ileus [facility]) [id number] EXAM: PORTABLE ABDOMEN - 1 VIEW COMPARISON:  08/11/2023 FINDINGS: Progressive  dilation of small bowel throughout the abdomen now measuring up to 5.3 cm in diameter (previously 4.4 cm). Air is present throughout the colon. No gross free intraperitoneal air. IMPRESSION: Progressive dilation of small bowel throughout the abdomen now measuring up to 5.3 cm in diameter. Although this may reflect ileus, appearance is concerning for small bowel obstruction. Electronically Signed   By: Duanne Guess D.O.   On: 08/13/2023 09:21   CT ABDOMEN PELVIS W CONTRAST Result Date: 08/11/2023 CLINICAL DATA:  Epigastric and mid abdominal pain, nausea and diarrhea since Sunday EXAM: CT ABDOMEN AND PELVIS WITH CONTRAST TECHNIQUE: Multidetector CT imaging of the abdomen and pelvis was performed using the standard protocol following bolus administration of intravenous contrast. RADIATION DOSE REDUCTION: This exam was performed according to the departmental dose-optimization program which includes automated exposure control, adjustment of the mA and/or kV according to patient size and/or use of iterative reconstruction technique. CONTRAST:  OMNIPAQUE IOHEXOL 300 MG/ML  SOLN COMPARISON:  07/20/2019 FINDINGS: Lower chest: No acute pleural or parenchymal lung disease. Hepatobiliary: No focal liver abnormality is seen. No gallstones, gallbladder wall thickening, or biliary dilatation. Pancreas: Unremarkable. No pancreatic ductal dilatation or surrounding inflammatory changes. Spleen: Normal in size without focal abnormality. Adrenals/Urinary Tract: Stable bilateral renal cortical cysts. No specific follow-up recommended. No urinary tract calculi or obstructive uropathy within either kidney. The adrenals and bladder are unremarkable. Stomach/Bowel: There is segmental dilation of the mid to distal jejunum within the left mid abdomen, measuring up to 4.4 cm and maximal diameter and with multiple gas fluid levels identified. The proximal jejunum and distal ileum are normal in caliber. Findings are consistent with  small-bowel obstruction versus long segmental ileus. Given the normal caliber of the proximal jejunum and distal ileum, developing closed loop obstruction could be considered. Sigmoid diverticulosis without diverticulitis. Normal appendix right lower quadrant. Vascular/Lymphatic: Aortic atherosclerosis. No enlarged abdominal or pelvic lymph nodes. Reproductive: Prostate  is enlarged. Other: Trace pelvic free fluid. Central mesenteric edema. No free intraperitoneal gas. No abdominal wall hernia. Musculoskeletal: No acute or destructive bony abnormalities. Reconstructed images demonstrate no additional findings. IMPRESSION: 1. Segmental dilation of the mid and distal jejunum, favor developing small bowel obstruction over segmental ileus. Given the normal caliber of the proximal jejunum and distal ileum, closed loop obstruction could be considered. 2. Stable Bosniak category 1 and 2 renal cysts. No specific imaging follow-up is recommended. 3. Trace pelvic free fluid and central mesenteric edema as above. 4. Sigmoid diverticulosis without diverticulitis. 5. Enlarged prostate. These results will be called to the ordering clinician or representative by the Radiologist Assistant, and communication documented in the PACS or [employer]. Electronically Signed   By: Sharlet Salina M.D.   On: 08/11/2023 20:02    Micro Results     Recent Results (from the past 240 hours)  MRSA Next Gen by PCR, Nasal     Status: None   Collection Time: 08/20/23 10:14 PM   Specimen: Nasal Mucosa; Nasal Swab  Result Value Ref Range Status   MRSA by PCR Next Gen NOT DETECTED NOT DETECTED Final    Comment: (NOTE) The GeneXpert MRSA Assay (FDA approved for NASAL specimens only), is one component of a comprehensive MRSA colonization surveillance program. It is not intended to diagnose MRSA infection nor to guide or monitor treatment for MRSA infections. Test performance is not FDA approved in patients less than 22  years old. Performed at [facility], [address] [place], [place] [address]     Today   Subjective    [name] today has no headache,no chest abdominal pain,no new weakness tingling or numbness, feels much better wants to go home today.     Objective   Blood pressure 129/81, pulse 95, temperature 98.2 F (36.8 C), temperature source Oral, resp. rate 19, height 5\' 10"  (1.778 m), weight 94.1 kg, SpO2 99%.   Intake/Output Summary (Last 24 hours) at 08/28/2023 0744 Last data filed at 08/28/2023 0728 Gross per 24 hour  Intake --  Output 1465 ml  Net -1465 ml    Exam  Awake Alert, No new F.N deficits,    [place].AT,PERRAL Supple Neck,   Symmetrical Chest wall movement, Good air movement bilaterally, CTAB RRR,No Gallops,   +ve B.Sounds, Abd Soft, midline abdominal wound VAC in place, left lower quadrant abdominal drain in place. No Cyanosis, Clubbing or edema    Data Review   Recent Labs  Lab 08/24/23 0343 08/25/23 0238 08/26/23 0237 08/27/23 0243 08/28/23 0406  WBC 11.2* 9.6 8.7 10.4 10.8*  HGB 8.2* 8.0* 7.5* 8.7* 9.0*  HCT 25.0* 24.7* 23.7* 27.5* 27.3*  PLT 373 408* 375 403* 411*  MCV 86.8 87.0 88.8 87.3 86.4  MCH 28.5 28.2 28.1 27.6 28.5  MCHC 32.8 32.4 31.6 31.6 33.0  RDW 13.3 13.6 13.5 13.8 13.6  LYMPHSABS 0.4* 1.3 1.1 1.3 1.3  MONOABS 0.6 0.7 0.6 0.7 0.7  EOSABS 0.3 0.2 0.2 0.1 0.1  BASOSABS 0.0 0.0 0.0 0.0 0.0    Recent Labs  Lab 08/23/23 0514 08/24/23 0343 08/26/23 0237 08/27/23 0243 08/28/23 0406  NA 133* 137 139 136 133*  K 4.1 3.7 3.8 4.1 3.9  CL 97* 107 110 107 103  CO2 25 24 22  21* 23  ANIONGAP 11 6 7 8 7   GLUCOSE 129* 121* 133* 104* 113*  BUN 17 19 18 15 12   CREATININE 1.05 0.96 0.85 0.88 0.76  AST  --  19 288* 95* 46*  ALT  --  20 323* 230* 169*  ALKPHOS  --  44 84 83 85  BILITOT  --  0.3 0.4 0.6 0.5  ALBUMIN  --  <1.5* 1.6* 1.7* 1.8*  CRP  --   --  7.9* 7.7* 9.5*  PROCALCITON  --   --  0.42 0.28 0.25  BNP  --   --   310.5* 111.3* 66.4  MG 2.1 1.9 1.9 1.8 1.8  PHOS 2.7 2.6 3.2 3.1 3.0  CALCIUM 8.1* 7.6* 7.9* 8.0* 8.0*    Total Time in preparing paper work, data evaluation and todays exam - 35 minutes  Signature  -    Susa Raring M.D on 08/28/2023 at 7:44 AM   -  To page go to [email or web address]

## 2023-08-28 NOTE — TOC Transition Note (Addendum)
 Transition of Care [facility]) - Discharge Note   Patient Details  Name: [name] MRN: [id number] Date of Birth: Jan 31, 1946  [facility] CM/SW Contact:  Gordy Clement, RN Phone Number: 08/28/2023, 9:03 AM  Update: Patient has decided to use [facility] for Home Health  Changes made AVS updated    Clinical Narrative:    Patient will DC to home today. Home wound vac has been delivered bedside (2/27). [facility] will provide home health AVS has been updated. Wife to transport             Patient Goals and CMS Choice            Discharge Placement                       Discharge Plan and Services Additional resources added to the After Visit Summary for                                       Social Drivers of Health (SDOH) Interventions SDOH Screenings   Food Insecurity: No Food Insecurity (08/12/2023)  Housing: Low Risk  (08/12/2023)  Transportation Needs: No Transportation Needs (08/12/2023)  Utilities: Not At Risk (08/12/2023)  Alcohol Screen: Low Risk  (05/05/2023)  Depression (PHQ2-9): Low Risk  (05/14/2023)  Financial Resource Strain: Low Risk  (05/05/2023)  Physical Activity: Sufficiently Active (05/05/2023)  Social Connections: Socially Integrated (08/12/2023)  Stress: No Stress Concern Present (05/05/2023)  Tobacco Use: Low Risk  (08/22/2023)  Health Literacy: Adequate Health Literacy (05/05/2023)     Readmission Risk Interventions     No data to display

## 2023-08-28 NOTE — Progress Notes (Signed)
 PICC Removal Note: PICC line removed from RUE. PICC catheter tip visualized and intact. Pressure held until hemostasis achieved. Pressure dressing applied. No redness, ecchymosis, edema, swelling, or drainage noted at site. Instructions provided on post PICC discharge care, including followup notification instructions.  Bedrest until 0915.

## 2023-08-28 NOTE — Progress Notes (Signed)
 6 Days Post-Op    Subjective: Tolerating diet and having bowel function. VAC changed at bedside with WOC RN. Drain removed without issue. Discussed post-op care and follow up.  Objective: Vital signs in last 24 hours: Temp:  [98.2 F (36.8 C)-99.2 F (37.3 C)] 98.2 F (36.8 C) (02/28 0727) Pulse Rate:  [89-98] 95 (02/28 0727) Resp:  [14-20] 19 (02/28 0727) BP: (125-136)/(81-93) 129/81 (02/28 0727) SpO2:  [90 %-99 %] 99 % (02/28 0727) Weight:  [94.1 kg] 94.1 kg (02/28 0500) Last BM Date : 08/27/23  Intake/Output from previous day: 02/27 0701 - 02/28 0700 In: -  Out: 715 [Urine:650; Drains:65] Intake/Output this shift: Total I/O In: -  Out: 750 [Urine:750]  General: pleasant, WD, WN male who is laying in bed in NAD Lungs: Respiratory effort nonlabored Abd: soft, appropriately ttp, mild distention, midline wound clean with healthy granulation, drain serous (removed without complication) Psych: A&Ox3 with an appropriate affect.   Lab Results: CBC  Recent Labs    08/27/23 0243 08/28/23 0406  WBC 10.4 10.8*  HGB 8.7* 9.0*  HCT 27.5* 27.3*  PLT 403* 411*   BMET Recent Labs    08/27/23 0243 08/28/23 0406  NA 136 133*  K 4.1 3.9  CL 107 103  CO2 21* 23  GLUCOSE 104* 113*  BUN 15 12  CREATININE 0.88 0.76  CALCIUM 8.0* 8.0*   PT/INR No results for input(s): "LABPROT", "INR" in the last 72 hours. ABG No results for input(s): "PHART", "HCO3" in the last 72 hours.  Invalid input(s): "PCO2", "PO2"  Studies/Results: No results found.   Anti-infectives: Anti-infectives (From admission, onward)    Start     Dose/Rate Route Frequency Ordered Stop   08/21/23 2200  piperacillin-tazobactam (ZOSYN) IVPB 3.375 g  Status:  Discontinued       Placed in "Followed by" Linked Group   3.375 g 12.5 mL/hr over 240 Minutes Intravenous Every 8 hours 08/20/23 1526 08/21/23 0638   08/21/23 0700  piperacillin-tazobactam (ZOSYN) IVPB 3.375 g       Placed in "Followed by"  Linked Group   3.375 g 12.5 mL/hr over 240 Minutes Intravenous Every 8 hours 08/21/23 0638 08/27/23 2031   08/20/23 1615  piperacillin-tazobactam (ZOSYN) IVPB 3.375 g       Placed in "Followed by" Linked Group   3.375 g 100 mL/hr over 30 Minutes Intravenous  Once 08/20/23 1526 08/20/23 1614       Assessment/Plan: Small bowel perforation in setting of ileus related to norovirus s/p exp lap, small bowel resection for 2 separate areas of perforation, left in discontinuity, placement of abthera wound vac 2/20 by AL S/P ex lap, SB anastamosis and closure 2/22 by DB POD 8/6 - tolerating soft diet - Hgb stable at 9.0 - WBC 10.8 off abx - VAC changed, drain removed  Stable for discharge home from surgical standpoint  FEN: soft, ensure ID: [email or web address] 2/20>2/27 VTE: lovenox    LOS: 15 days    Juliet Rude, [facility] 08/28/2023, 9:25 AM Please see Amion for pager number during day hours 7:00am-4:30pm

## 2023-08-28 NOTE — [facility] Narrative Medicine (Signed)
 WOC Nurse Consult Note: The consult was follow by the PA. Reason for Consult: Apply VAC on midline surgical dehiscence Wound type: Surgical. Pressure Injury POA: NA Measurement: 15 x 3 x 2.5 cm Wound bed: 70% red/pink 30% fat and yellow tissue. Drainage (amount, consistency, odor) minimum amount Periwound: intact Dressing procedure/placement/frequency:   Removed old NPWT dressing Cleansed wound with normal saline Periwound skin protected with skin barrier wipe or window framed with drape Skin protected to the hip with VAC drape for foam bridge  Filled wound with 1 piece of black foam  Sealed NPWT dressing at HG Patient received IV/PO pain medication per bedside nurse prior to dressing change Patient tolerated procedure well.  Pt will be discharge today. Applied new dressing with the Home VAC machine.  Please reconsult if further assistance is needed. Thank-you,  Denyse Amass BSN, RN, [employer], WOC  (Pager: [phone])

## 2023-08-28 NOTE — Plan of Care (Signed)

## 2023-08-31 ENCOUNTER — Telehealth: Payer: Self-pay

## 2023-08-31 DIAGNOSIS — Z48815 Encounter for surgical aftercare following surgery on the digestive system: Secondary | ICD-10-CM | POA: Diagnosis not present

## 2023-08-31 DIAGNOSIS — E669 Obesity, unspecified: Secondary | ICD-10-CM | POA: Diagnosis not present

## 2023-08-31 DIAGNOSIS — D509 Iron deficiency anemia, unspecified: Secondary | ICD-10-CM | POA: Diagnosis not present

## 2023-08-31 DIAGNOSIS — Z4801 Encounter for change or removal of surgical wound dressing: Secondary | ICD-10-CM | POA: Diagnosis not present

## 2023-08-31 DIAGNOSIS — F419 Anxiety disorder, unspecified: Secondary | ICD-10-CM | POA: Diagnosis not present

## 2023-08-31 DIAGNOSIS — Z6829 Body mass index (BMI) 29.0-29.9, adult: Secondary | ICD-10-CM | POA: Diagnosis not present

## 2023-08-31 DIAGNOSIS — I10 Essential (primary) hypertension: Secondary | ICD-10-CM | POA: Diagnosis not present

## 2023-08-31 DIAGNOSIS — G8929 Other chronic pain: Secondary | ICD-10-CM | POA: Diagnosis not present

## 2023-08-31 DIAGNOSIS — K5900 Constipation, unspecified: Secondary | ICD-10-CM | POA: Diagnosis not present

## 2023-08-31 DIAGNOSIS — M810 Age-related osteoporosis without current pathological fracture: Secondary | ICD-10-CM | POA: Diagnosis not present

## 2023-08-31 DIAGNOSIS — E440 Moderate protein-calorie malnutrition: Secondary | ICD-10-CM | POA: Diagnosis not present

## 2023-08-31 DIAGNOSIS — K760 Fatty (change of) liver, not elsewhere classified: Secondary | ICD-10-CM | POA: Diagnosis not present

## 2023-08-31 NOTE — Telephone Encounter (Signed)
 Copied from CRM [id number]. Topic: General - Other >> Aug 31, 2023  4:10 PM Isabelle Course C wrote: Reason for CRM: Dahlia Client from [facility] called and stated start of care today in regard to wound that needs to be changed 2x weekly

## 2023-09-01 ENCOUNTER — Telehealth: Payer: Self-pay | Admitting: Neurology

## 2023-09-01 NOTE — Telephone Encounter (Signed)
 Copied from CRM [id number]. Topic: General - Other >> Sep 01, 2023  2:13 PM [place] A wrote: Reason for CRM: Patient called because he missed a call from [facility] call patient back

## 2023-09-01 NOTE — Telephone Encounter (Signed)
 Called to speak with patient. No answer. LVM

## 2023-09-02 NOTE — Telephone Encounter (Signed)
 Called and spoke with patient. He just wanted to make provider aware of current situation. He said [facility] started his wound care this past Monday (08/31/23). They are scheduled to come the next two weeks

## 2023-09-02 NOTE — Telephone Encounter (Signed)
 Returned patient's call from yesterday (09/01/23)

## 2023-09-03 DIAGNOSIS — Z4801 Encounter for change or removal of surgical wound dressing: Secondary | ICD-10-CM | POA: Diagnosis not present

## 2023-09-03 DIAGNOSIS — K760 Fatty (change of) liver, not elsewhere classified: Secondary | ICD-10-CM | POA: Diagnosis not present

## 2023-09-03 DIAGNOSIS — F419 Anxiety disorder, unspecified: Secondary | ICD-10-CM | POA: Diagnosis not present

## 2023-09-03 DIAGNOSIS — Z48815 Encounter for surgical aftercare following surgery on the digestive system: Secondary | ICD-10-CM | POA: Diagnosis not present

## 2023-09-03 DIAGNOSIS — E440 Moderate protein-calorie malnutrition: Secondary | ICD-10-CM | POA: Diagnosis not present

## 2023-09-03 DIAGNOSIS — I10 Essential (primary) hypertension: Secondary | ICD-10-CM | POA: Diagnosis not present

## 2023-09-04 ENCOUNTER — Encounter: Payer: Self-pay | Admitting: Physician Assistant

## 2023-09-04 ENCOUNTER — Ambulatory Visit: Admitting: Physician Assistant

## 2023-09-04 VITALS — BP 127/81 | HR 87 | Ht 70.0 in | Wt 192.4 lb

## 2023-09-04 DIAGNOSIS — Z4801 Encounter for change or removal of surgical wound dressing: Secondary | ICD-10-CM | POA: Diagnosis not present

## 2023-09-04 DIAGNOSIS — F419 Anxiety disorder, unspecified: Secondary | ICD-10-CM | POA: Diagnosis not present

## 2023-09-04 DIAGNOSIS — K760 Fatty (change of) liver, not elsewhere classified: Secondary | ICD-10-CM | POA: Diagnosis not present

## 2023-09-04 DIAGNOSIS — K56609 Unspecified intestinal obstruction, unspecified as to partial versus complete obstruction: Secondary | ICD-10-CM | POA: Diagnosis not present

## 2023-09-04 DIAGNOSIS — K668 Other specified disorders of peritoneum: Secondary | ICD-10-CM

## 2023-09-04 DIAGNOSIS — Z48815 Encounter for surgical aftercare following surgery on the digestive system: Secondary | ICD-10-CM | POA: Diagnosis not present

## 2023-09-04 DIAGNOSIS — I10 Essential (primary) hypertension: Secondary | ICD-10-CM | POA: Diagnosis not present

## 2023-09-04 DIAGNOSIS — E440 Moderate protein-calorie malnutrition: Secondary | ICD-10-CM | POA: Diagnosis not present

## 2023-09-04 DIAGNOSIS — Z9889 Other specified postprocedural states: Secondary | ICD-10-CM

## 2023-09-04 NOTE — Progress Notes (Signed)
 Established patient visit   Patient: [name]   DOB: 1946/02/15   78 y.o. Male  MRN: [id number] Visit Date: 09/04/2023  Today's healthcare provider: Alfredia Ferguson, PA-C   Cc. Hospital follow up   Subjective     Pt is a 78 y/o male w/ pmh of HTN, HLD, was hospitalized from 2/12-2/28 for norovirus complicated by a small bowel obstruction. Pt failed conservative tx, developed pneumoperitoneum d/t a small bowel perf, required surgical intervention. Ex lap w/ small bowel resection 2/20, reanastomosis 2/22  Pt was discharged w/ proper bowel activity, allowed a regular diet upon discharge, abdominal wound vac.  Has started home health wound care 08/31/23 F/u gen surg appt 09/18/23.  Today, pt reports feeling well, wound healing appropriately, and having regular formed bowel movements.  Medications: Outpatient Medications Prior to Visit  Medication Sig   acetaminophen (TYLENOL) 325 MG tablet Take 1-2 tablets (325-650 mg total) by mouth every 6 (six) hours as needed for mild pain (pain score 1-3 or temp > 100.5).   ALPRAZolam (XANAX) 0.5 MG tablet Take 0.5 mg by mouth at bedtime as needed for anxiety or sleep.   amLODipine (NORVASC) 10 MG tablet TAKE 1 TABLET DAILY   aspirin 81 MG chewable tablet Chew 1 tablet (81 mg total) by mouth 2 (two) times daily.   benazepril (LOTENSIN) 40 MG tablet Take 20 mg by mouth daily.   Cholecalciferol 25 MCG (1000 UT) capsule Take 1,000 Units by mouth daily.   docusate sodium (COLACE) 100 MG capsule Take 1 capsule (100 mg total) by mouth 2 (two) times daily as needed for mild constipation.   hydrocortisone (ANUSOL-HC) 25 MG suppository Place 25 mg rectally 2 (two) times daily as needed for anal itching.   Iron, Ferrous Sulfate, 325 (65 Fe) MG TABS Take 325 mg by mouth daily.   losartan (COZAAR) 50 MG tablet Take 1 tablet (50 mg total) by mouth daily.   metoprolol succinate (TOPROL-XL) 50 MG 24 hr tablet TAKE AS INSTRUCTED BY YOUR PRESCRIBER (Patient  taking differently: Take 50 mg by mouth daily.)   Multiple Vitamins-Minerals (MULTIVITAMIN ADULT PO) Take 2 tablets by mouth daily.   rosuvastatin (CRESTOR) 20 MG tablet Take 1 tablet (20 mg total) by mouth daily.   sildenafil (VIAGRA) 100 MG tablet Take 100 mg by mouth as needed for erectile dysfunction (1 hour prior to intercourse).   tamsulosin (FLOMAX) 0.4 MG CAPS capsule Take 0.4 mg by mouth daily.   traMADol (ULTRAM) 50 MG tablet Take 50 mg by mouth every 6 (six) hours as needed for moderate pain (pain score 4-6).   No facility-administered medications prior to visit.    Review of Systems  Constitutional:  Negative for fatigue and fever.  Respiratory:  Negative for cough and shortness of breath.   Cardiovascular:  Negative for chest pain, palpitations and leg swelling.  Neurological:  Negative for dizziness and headaches.       Objective    BP 127/81   Pulse 87   Ht 5\' 10"  (1.778 m)   Wt 192 lb 6.4 oz (87.3 kg)   SpO2 98%   BMI 27.61 kg/m    Physical Exam Constitutional:      General: He is awake.     Appearance: He is well-developed.  HENT:     Head: Normocephalic.  Eyes:     Conjunctiva/sclera: Conjunctivae normal.  Cardiovascular:     Rate and Rhythm: Normal rate and regular rhythm.     Heart  sounds: Normal heart sounds.  Pulmonary:     Effort: Pulmonary effort is normal.     Breath sounds: Normal breath sounds.  Abdominal:     Comments: Belly is soft, wound vac in place with no surrounding erythema, edema. Small amount of serous discharge in vac  Skin:    General: Skin is warm.  Neurological:     Mental Status: He is alert and oriented to person, place, and time.  Psychiatric:        Attention and Perception: Attention normal.        Mood and Affect: Mood normal.        Speech: Speech normal.        Behavior: Behavior is cooperative.     No results found for any visits on 09/04/23.  Assessment & Plan    Small bowel obstruction (HCC) -     CBC with  Differential/Platelet -     Magnesium -     Comprehensive metabolic panel  S/P exploratory laparotomy  Pneumoperitoneum  Repeat cbc, ensure hgb improving, Repeat mag, ensure normal/stable, repeat cmp to assess for improvement in transient elevated lfts.   Pt healing well, having regular BM. Has f/u with gen surg scheduled.  On initial review on discharge summary-- recommended chest xray, but I could not find a reason for one reading hospital notes. On exam-- lungs CTA, O2 normal. Pt not complaining of respiratory symptoms.  Once pt had left-- more fully reviewed his hospital course, on abd/pelvis CT 08/20/23 "Trace right pleural effusion with mild dependent atelectasis at both lung bases"  Given lack of symptoms, normal exam and vitals, I do not find the need to call the pt back for a chest xray, but if any symptoms/abnormal vitals develop would recommend at that time.  Return if symptoms worsen or fail to improve.     Alfredia Ferguson, PA-C  [facility] [phone] (phone) [phone] (fax)  [facility]

## 2023-09-05 LAB — CBC WITH DIFFERENTIAL/PLATELET
Absolute Lymphocytes: 2118 {cells}/uL (ref 850–3900)
Absolute Monocytes: 676 {cells}/uL (ref 200–950)
Basophils Absolute: 28 {cells}/uL (ref 0–200)
Basophils Relative: 0.4 %
Eosinophils Absolute: 138 {cells}/uL (ref 15–500)
Eosinophils Relative: 2 %
HCT: 33.9 % — ABNORMAL LOW (ref 38.5–50.0)
Hemoglobin: 10.7 g/dL — ABNORMAL LOW (ref 13.2–17.1)
MCH: 27.3 pg (ref 27.0–33.0)
MCHC: 31.6 g/dL — ABNORMAL LOW (ref 32.0–36.0)
MCV: 86.5 fL (ref 80.0–100.0)
MPV: 9.9 fL (ref 7.5–12.5)
Monocytes Relative: 9.8 %
Neutro Abs: 3940 {cells}/uL (ref 1500–7800)
Neutrophils Relative %: 57.1 %
Platelets: 554 10*3/uL — ABNORMAL HIGH (ref 140–400)
RBC: 3.92 10*6/uL — ABNORMAL LOW (ref 4.20–5.80)
RDW: 13.3 % (ref 11.0–15.0)
Total Lymphocyte: 30.7 %
WBC: 6.9 10*3/uL (ref 3.8–10.8)

## 2023-09-05 LAB — COMPREHENSIVE METABOLIC PANEL
AG Ratio: 1.1 (calc) (ref 1.0–2.5)
ALT: 63 U/L — ABNORMAL HIGH (ref 9–46)
AST: 31 U/L (ref 10–35)
Albumin: 3.6 g/dL (ref 3.6–5.1)
Alkaline phosphatase (APISO): 102 U/L (ref 35–144)
BUN: 11 mg/dL (ref 7–25)
CO2: 28 mmol/L (ref 20–32)
Calcium: 8.9 mg/dL (ref 8.6–10.3)
Chloride: 104 mmol/L (ref 98–110)
Creat: 0.87 mg/dL (ref 0.70–1.28)
Globulin: 3.4 g/dL (ref 1.9–3.7)
Glucose, Bld: 100 mg/dL — ABNORMAL HIGH (ref 65–99)
Potassium: 4.6 mmol/L (ref 3.5–5.3)
Sodium: 140 mmol/L (ref 135–146)
Total Bilirubin: 0.5 mg/dL (ref 0.2–1.2)
Total Protein: 7 g/dL (ref 6.1–8.1)

## 2023-09-05 LAB — MAGNESIUM: Magnesium: 2 mg/dL (ref 1.5–2.5)

## 2023-09-07 ENCOUNTER — Encounter: Payer: Self-pay | Admitting: Physician Assistant

## 2023-09-07 DIAGNOSIS — F419 Anxiety disorder, unspecified: Secondary | ICD-10-CM | POA: Diagnosis not present

## 2023-09-07 DIAGNOSIS — Z4801 Encounter for change or removal of surgical wound dressing: Secondary | ICD-10-CM | POA: Diagnosis not present

## 2023-09-07 DIAGNOSIS — K760 Fatty (change of) liver, not elsewhere classified: Secondary | ICD-10-CM | POA: Diagnosis not present

## 2023-09-07 DIAGNOSIS — E440 Moderate protein-calorie malnutrition: Secondary | ICD-10-CM | POA: Diagnosis not present

## 2023-09-07 DIAGNOSIS — I10 Essential (primary) hypertension: Secondary | ICD-10-CM | POA: Diagnosis not present

## 2023-09-07 DIAGNOSIS — Z48815 Encounter for surgical aftercare following surgery on the digestive system: Secondary | ICD-10-CM | POA: Diagnosis not present

## 2023-09-10 DIAGNOSIS — Z48815 Encounter for surgical aftercare following surgery on the digestive system: Secondary | ICD-10-CM | POA: Diagnosis not present

## 2023-09-10 DIAGNOSIS — K760 Fatty (change of) liver, not elsewhere classified: Secondary | ICD-10-CM | POA: Diagnosis not present

## 2023-09-10 DIAGNOSIS — F419 Anxiety disorder, unspecified: Secondary | ICD-10-CM | POA: Diagnosis not present

## 2023-09-10 DIAGNOSIS — I10 Essential (primary) hypertension: Secondary | ICD-10-CM | POA: Diagnosis not present

## 2023-09-10 DIAGNOSIS — E440 Moderate protein-calorie malnutrition: Secondary | ICD-10-CM | POA: Diagnosis not present

## 2023-09-10 DIAGNOSIS — Z4801 Encounter for change or removal of surgical wound dressing: Secondary | ICD-10-CM | POA: Diagnosis not present

## 2023-09-14 DIAGNOSIS — E440 Moderate protein-calorie malnutrition: Secondary | ICD-10-CM | POA: Diagnosis not present

## 2023-09-14 DIAGNOSIS — I10 Essential (primary) hypertension: Secondary | ICD-10-CM | POA: Diagnosis not present

## 2023-09-14 DIAGNOSIS — Z4801 Encounter for change or removal of surgical wound dressing: Secondary | ICD-10-CM | POA: Diagnosis not present

## 2023-09-14 DIAGNOSIS — F419 Anxiety disorder, unspecified: Secondary | ICD-10-CM | POA: Diagnosis not present

## 2023-09-14 DIAGNOSIS — Z48815 Encounter for surgical aftercare following surgery on the digestive system: Secondary | ICD-10-CM | POA: Diagnosis not present

## 2023-09-14 DIAGNOSIS — K760 Fatty (change of) liver, not elsewhere classified: Secondary | ICD-10-CM | POA: Diagnosis not present

## 2023-09-17 DIAGNOSIS — I10 Essential (primary) hypertension: Secondary | ICD-10-CM | POA: Diagnosis not present

## 2023-09-17 DIAGNOSIS — Z4801 Encounter for change or removal of surgical wound dressing: Secondary | ICD-10-CM | POA: Diagnosis not present

## 2023-09-17 DIAGNOSIS — K760 Fatty (change of) liver, not elsewhere classified: Secondary | ICD-10-CM | POA: Diagnosis not present

## 2023-09-17 DIAGNOSIS — F419 Anxiety disorder, unspecified: Secondary | ICD-10-CM | POA: Diagnosis not present

## 2023-09-17 DIAGNOSIS — E440 Moderate protein-calorie malnutrition: Secondary | ICD-10-CM | POA: Diagnosis not present

## 2023-09-17 DIAGNOSIS — Z48815 Encounter for surgical aftercare following surgery on the digestive system: Secondary | ICD-10-CM | POA: Diagnosis not present

## 2023-09-21 DIAGNOSIS — Z4801 Encounter for change or removal of surgical wound dressing: Secondary | ICD-10-CM | POA: Diagnosis not present

## 2023-09-21 DIAGNOSIS — Z48815 Encounter for surgical aftercare following surgery on the digestive system: Secondary | ICD-10-CM | POA: Diagnosis not present

## 2023-09-21 DIAGNOSIS — K760 Fatty (change of) liver, not elsewhere classified: Secondary | ICD-10-CM | POA: Diagnosis not present

## 2023-09-21 DIAGNOSIS — I10 Essential (primary) hypertension: Secondary | ICD-10-CM | POA: Diagnosis not present

## 2023-09-21 DIAGNOSIS — E440 Moderate protein-calorie malnutrition: Secondary | ICD-10-CM | POA: Diagnosis not present

## 2023-09-21 DIAGNOSIS — F419 Anxiety disorder, unspecified: Secondary | ICD-10-CM | POA: Diagnosis not present

## 2023-09-29 ENCOUNTER — Other Ambulatory Visit: Payer: Self-pay | Admitting: Family Medicine

## 2023-10-14 ENCOUNTER — Telehealth: Payer: Self-pay | Admitting: *Deleted

## 2023-10-14 NOTE — Telephone Encounter (Signed)
 08/03/23- Late entry for 1 year call for RTKA as well as 90 day call for RTSA.

## 2023-11-12 ENCOUNTER — Ambulatory Visit: Payer: Medicare Other | Admitting: Family Medicine

## 2023-11-15 NOTE — Assessment & Plan Note (Signed)
 Well controlled, no changes to meds. Encouraged heart healthy diet such as the DASH diet and exercise as tolerated.

## 2023-11-15 NOTE — Assessment & Plan Note (Signed)
 hgba1c acceptable, minimize simple carbs. Increase exercise as tolerated.

## 2023-11-15 NOTE — Assessment & Plan Note (Signed)
 Tolerating statin, encouraged heart healthy diet, avoid trans fats, minimize simple carbs and saturated fats. Increase exercise as tolerated

## 2023-11-15 NOTE — Assessment & Plan Note (Signed)
 Uses Miralax and stool softener and keeps the bowels moving. Uses Suppositories and MOM as needed with good results

## 2023-11-16 ENCOUNTER — Ambulatory Visit (INDEPENDENT_AMBULATORY_CARE_PROVIDER_SITE_OTHER): Payer: Medicare Other | Admitting: Family Medicine

## 2023-11-16 VITALS — BP 122/84 | HR 70 | Resp 16 | Ht 70.0 in | Wt 201.2 lb

## 2023-11-16 DIAGNOSIS — R739 Hyperglycemia, unspecified: Secondary | ICD-10-CM

## 2023-11-16 DIAGNOSIS — E785 Hyperlipidemia, unspecified: Secondary | ICD-10-CM

## 2023-11-16 DIAGNOSIS — Z23 Encounter for immunization: Secondary | ICD-10-CM

## 2023-11-16 DIAGNOSIS — R109 Unspecified abdominal pain: Secondary | ICD-10-CM | POA: Diagnosis not present

## 2023-11-16 DIAGNOSIS — E46 Unspecified protein-calorie malnutrition: Secondary | ICD-10-CM | POA: Diagnosis not present

## 2023-11-16 DIAGNOSIS — K5900 Constipation, unspecified: Secondary | ICD-10-CM | POA: Diagnosis not present

## 2023-11-16 DIAGNOSIS — R7982 Elevated C-reactive protein (CRP): Secondary | ICD-10-CM | POA: Insufficient documentation

## 2023-11-16 DIAGNOSIS — I10 Essential (primary) hypertension: Secondary | ICD-10-CM

## 2023-11-16 LAB — COMPREHENSIVE METABOLIC PANEL WITH GFR
ALT: 16 U/L (ref 0–53)
AST: 16 U/L (ref 0–37)
Albumin: 4.5 g/dL (ref 3.5–5.2)
Alkaline Phosphatase: 88 U/L (ref 39–117)
BUN: 11 mg/dL (ref 6–23)
CO2: 27 meq/L (ref 19–32)
Calcium: 9.6 mg/dL (ref 8.4–10.5)
Chloride: 107 meq/L (ref 96–112)
Creatinine, Ser: 0.84 mg/dL (ref 0.40–1.50)
GFR: 85.17 mL/min (ref >60.00)
Glucose, Bld: 98 mg/dL (ref 70–99)
Potassium: 4.3 meq/L (ref 3.5–5.1)
Sodium: 141 meq/L (ref 135–145)
Total Bilirubin: 0.6 mg/dL (ref 0.2–1.2)
Total Protein: 7.4 g/dL (ref 6.0–8.3)

## 2023-11-16 LAB — CBC WITH DIFFERENTIAL/PLATELET
Basophils Absolute: 0 10*3/uL (ref 0.0–0.1)
Basophils Relative: 0.4 % (ref 0.0–3.0)
Eosinophils Absolute: 0.4 10*3/uL (ref 0.0–0.7)
Eosinophils Relative: 6.8 % — ABNORMAL HIGH (ref 0.0–5.0)
HCT: 39.5 % (ref 39.0–52.0)
Hemoglobin: 12.7 g/dL — ABNORMAL LOW (ref 13.0–17.0)
Lymphocytes Relative: 36.7 % (ref 12.0–46.0)
Lymphs Abs: 1.9 10*3/uL (ref 0.7–4.0)
MCHC: 32.3 g/dL (ref 30.0–36.0)
MCV: 83.7 fl (ref 78.0–100.0)
Monocytes Absolute: 0.5 10*3/uL (ref 0.1–1.0)
Monocytes Relative: 9.7 % (ref 3.0–12.0)
Neutro Abs: 2.5 10*3/uL (ref 1.4–7.7)
Neutrophils Relative %: 46.4 % (ref 43.0–77.0)
Platelets: 178 10*3/uL (ref 150.0–400.0)
RBC: 4.72 Mil/uL (ref 4.22–5.81)
RDW: 14.4 % (ref 11.5–15.5)
WBC: 5.3 10*3/uL (ref 4.0–10.5)

## 2023-11-16 LAB — LIPID PANEL
Cholesterol: 111 mg/dL (ref 0–200)
HDL: 49.2 mg/dL (ref >39.00)
LDL Cholesterol: 47 mg/dL (ref 0–99)
NonHDL: 61.89
Total CHOL/HDL Ratio: 2
Triglycerides: 72 mg/dL (ref 0.0–149.0)
VLDL: 14.4 mg/dL (ref 0.0–40.0)

## 2023-11-16 LAB — HEMOGLOBIN A1C: Hgb A1c MFr Bld: 5.9 % (ref 4.6–6.5)

## 2023-11-16 LAB — HIGH SENSITIVITY CRP: CRP, High Sensitivity: 1.18 mg/L (ref 0.000–5.000)

## 2023-11-16 MED ORDER — HYDROCORTISONE ACETATE 25 MG RE SUPP: 25.0000 mg | Freq: Two times a day (BID) | RECTAL | 5 refills | Status: AC | PRN

## 2023-11-16 MED ORDER — ALPRAZOLAM 0.5 MG PO TABS: 0.5000 mg | ORAL_TABLET | Freq: Every evening | ORAL | 1 refills | Status: AC | PRN

## 2023-11-16 NOTE — Patient Instructions (Signed)
Bowel Obstruction A bowel obstruction is a blockage in the small or large bowel. The bowel is also called the intestine. It is a long tube that connects the stomach to the anus. When a person eats and drinks, food and fluids go from the mouth to the stomach to the small bowel. This is where most of the nutrients in the food and fluids are absorbed. After the small bowel, material passes through the large bowel for further absorption until any leftover material leaves the body as stool (feces) through the anus during a bowel movement. A bowel obstruction will prevent food and fluids from passing through the bowel as they normally do during digestion. The bowel can become partially or completely blocked. If this condition is not treated, it can be dangerous because the bowel could rupture. What are the causes? Common causes of this condition include: Scar tissue in the body (adhesions) from previous surgery or treatment with high-energy X-rays (radiation). Recent surgery. This may cause the movements of the bowel to slow down and cause food to block the intestine. Inflammatory bowel disease, such as Crohn's disease or diverticulitis. Growths or tumors. A bulging organ or tissue (hernia). Twisting of the bowel (volvulus). A swallowed object (foreign body). Slipping of a part of the bowel into another part (intussusception). What are the signs or symptoms? Symptoms of this condition include: Pain in the abdomen. Depending on the degree of obstruction, pain may be: Mild or severe. Dull cramping or sharp pain. In one area or in the entire abdomen. Nausea and vomiting. Vomit may be greenish or a yellow bile color. Bloating in the abdomen. Constipation. Being unable to pass gas. Frequent belching. Diarrhea. This may occur if the obstruction is partial and runny stool is able to leak around the obstruction. How is this diagnosed? This condition may be diagnosed based on: A physical exam. Your  medical history. Exams to look into the small intestine or the large intestine (endoscopy or colonoscopy). Imaging tests of the abdomen or pelvis, such as X-ray or CT scan. Blood or urine tests. How is this treated? Treatment for this condition depends on the cause and severity of the problem. Treatment may include: Fluids and pain medicines that are given through an IV. Your health care provider may tell you not to eat or drink if you have nausea or vomiting. A clear liquid diet. You may be asked to consume a clear liquid diet for several days. This allows the bowel to rest. Placement of a small tube (nasogastric tube) through the nose, down the throat, and into the stomach. This may relieve pain, discomfort, and nausea by removing blocked air and fluids from the stomach. It can also help the obstruction clear up faster. Surgery. This may be required if other treatments do not work. Surgery may be required for: Bowel obstruction from a hernia. This can be an emergency procedure. Scar tissue that causes frequent or severe obstructions. Follow these instructions at home: Medicines Take over-the-counter and prescription medicines only as told by your health care provider. If you were prescribed an antibiotic medicine, take it as told by your health care provider. Do not stop taking the antibiotic even if you start to feel better. General instructions Follow instructions from your health care provider about eating and drinking restrictions. You may need to avoid solid foods and drink only clear liquids until your condition improves. Return to your normal activities as told by your health care provider. Ask your health care provider what activities   are safe for you. Rest as told by your health care provider. Avoid sitting for a long time without moving. Get up to take short walks every 1-2 hours. This is important to improve blood flow and breathing. Ask for help if you feel weak or unsteady. Keep  all follow-up visits. This is important. How is this prevented? After having a bowel obstruction, you are more likely to have another. You may do the following things to prevent another obstruction: If you have a long-term (chronic) disease, pay attention to your symptoms and contact your health care provider if you have questions or concerns. Avoid becoming constipated. You may need to take these actions to prevent or treat constipation: Drink enough fluid to keep your urine pale yellow. Take over-the-counter or prescription medicines. Eat foods that are high in fiber, such as beans, whole grains, and fresh fruits and vegetables. Limit foods that are high in fat and processed sugars, such as fried or sweet foods. Stay active. Exercise for 30 minutes or more, 5 or more days each week. Ask your health care provider which exercises are safe for you. Avoid stress. Find ways to reduce stress, such as meditation, exercise, or taking time for activities that relax you. Instead of eating three large meals each day, eat three small meals with three small snacks. Work with a Data processing manager to make a healthy meal plan that works for you. Do not use any products that contain nicotine or tobacco. These products include cigarettes, chewing tobacco, and vaping devices, such as e-cigarettes. If you need help quitting, ask your health care provider. Contact a health care provider if: You have a fever. You have chills. Get help right away if: You have increased pain or cramping. You vomit blood. You have uncontrolled vomiting or nausea. You cannot drink fluids because of vomiting or pain. You become confused. You begin feeling very thirsty (dehydrated). You have severe bloating. You feel extremely weak or you faint. Summary A bowel obstruction is a blockage in the small or large bowel. A bowel obstruction will prevent food and fluids from passing through the bowel as they normally do during  digestion. Treatment for this condition depends on the cause and severity of the problem. It may include fluids and pain medicines through an IV, a simple diet, a nasogastric tube, or surgery. Follow instructions from your health care provider about eating restrictions. You may need to avoid solid foods and consume only clear liquids until your condition improves. This information is not intended to replace advice given to you by your health care provider. Make sure you discuss any questions you have with your health care provider. Document Revised: 07/29/2020 Document Reviewed: 07/29/2020 Elsevier Patient Education  2024 [employer].

## 2023-11-16 NOTE — Assessment & Plan Note (Signed)
 S/p partial small bowel resection after obstruction. Bowels moving comfortably and his energy is improving.

## 2023-11-16 NOTE — Progress Notes (Signed)
 Subjective:    Patient ID: [name], male    DOB: Sep 30, 1945, 78 y.o.   MRN: [id number]  Chief Complaint  Patient presents with   Medical Management of Chronic Issues    Patient presents today for a 6 month follow-up    HPI Discussed the use of AI scribe software for clinical note transcription with the patient, who gave verbal consent to proceed.  History of Present Illness [name] is a 78 year old male with a history of small bowel obstruction who presents for follow-up after surgery.  He underwent surgery for a small bowel obstruction, during which a portion of his intestines was removed. Post-surgery, he used a portable vacuum device for four weeks without major issues. His bowel movements are now comfortable, with no severe pain or melena. The obstruction was initially identified after experiencing norovirus, which led to inflammation and irritation, eventually resulting in a blockage that required emergency surgery.  Post-surgery, he experienced anemia with hemoglobin levels dropping to 7.5, necessitating a blood transfusion. His hemoglobin has since improved to 10.7. He also had elevated CRP levels and low protein levels in his blood after surgery. He is working on improving his protein intake.  He has a history of joint replacements, including a right knee and bilateral shoulder replacements, which have improved his range of motion and function but still cause some achy pain, particularly at night, rated at 3-4 out of 10. He finds relief through physical activity, stretching, and using topical treatments like Biofreeze and diclofenac .  He occasionally uses Xanax , taking it once or twice a week, or sometimes going months without it, to manage feelings of anxiety. He uses SolHC suppositories as needed, particularly after his bowel function was restored post-surgery, to manage any flare-ups.  No severe pain, melena, and reports manageable achy pain in joints.    Past  Medical History:  Diagnosis Date   Abnormal CT scan, gastrointestinal tract suspect angioedema    Anemia 03/16/2016   IRON    Anxiety    Arthritis    Back pain 10/10/2012   Fatty liver 2005   seen on 2005 ultrasound,05/2015 CT.    Hemorrhoids 08/22/2016   Hemorrhoids, internal, with bleeding 2005   internal and external. 2005 band ligation (dr Onesimo Bijou in [facility])   Hyperglycemia 08/21/2013   Hyperlipidemia    Hypertension    Nocturia 08/21/2013   Obesity (BMI 30.0-34.9)    Osteoporosis    Pneumonia    as a child   Pre-diabetes    Rectal bleeding 07/30/2014   Renal cyst    Left Kidney   SBO (small bowel obstruction) (HCC)    Tubular adenoma of colon before 2005, 2012, 08/2014   Unspecified constipation 02/06/2014   Vitamin D deficiency     Past Surgical History:  Procedure Laterality Date   ANAL ANASTOMOSIS N/A 08/22/2023   Procedure: BOWEL REANASTOMOSIS;  Surgeon: Oza Blumenthal, MD;  Location: [facility] OR;  Service: General;  Laterality: N/A;   APPLICATION OF WOUND VAC  08/20/2023   Procedure: APPLICATION OF ABTHERA WOUND VAC;  Surgeon: Anda Bamberg, MD;  Location: [facility] OR;  Service: General;;   BICEPT TENODESIS Right 03/12/2023   Procedure: BICEPS TENODESIS;  Surgeon: Jasmine Mesi, MD;  Location: [facility] OR;  Service: Orthopedics;  Laterality: Right;   CATARACT EXTRACTION W/ INTRAOCULAR LENS IMPLANT Bilateral    COLONOSCOPY  before 2005, 2012, 2013, 08/2014.    ENTEROSCOPY N/A 10/05/2017   Procedure: ENTEROSCOPY;  Surgeon: Kenney Peacemaker,  MD;  Location: [facility];  Service: Endoscopy;  Laterality: N/A;   FRACTURE SURGERY Left 1989   leg   HEMORRHOID BANDING  2005   IR RADIOLOGIST EVAL & MGMT  11/11/2021   KNEE SURGERY Left 2010   arthroscopy, torn carilage   KNEE SURGERY Right 2012   arthroscopy   LAPAROTOMY N/A 08/20/2023   Procedure: EXPLORATORY LAPAROTOMY, SMALL  BOWEL RESECTION;  Surgeon: Anda Bamberg, MD;  Location: [facility] OR;  Service: General;  Laterality:  N/A;   LAPAROTOMY N/A 08/22/2023   Procedure: EXPLORATORY LAPAROTOMY WITH CLOSURE OF OPEN ABDOMEN;  Surgeon: Oza Blumenthal, MD;  Location: [facility] OR;  Service: General;  Laterality: N/A;   REVERSE SHOULDER ARTHROPLASTY Right 03/12/2023   Procedure: REVERSE SHOULDER ARTHROPLASTY;  Surgeon: Jasmine Mesi, MD;  Location: [facility] OR;  Service: Orthopedics;  Laterality: Right;   TONSILLECTOMY  1957   TOTAL KNEE ARTHROPLASTY Right 07/24/2022   Procedure: RIGHT TOTAL KNEE ARTHROPLASTY;  Surgeon: Jasmine Mesi, MD;  Location: [facility] OR;  Service: Orthopedics;  Laterality: Right;   TOTAL SHOULDER ARTHROPLASTY Left 11/01/2020   Procedure: LEFT ANATOMIC SHOULDER REPLACEMENT;  Surgeon: Jasmine Mesi, MD;  Location: [facility] OR;  Service: Orthopedics;  Laterality: Left;   VENTRAL HERNIA REPAIR N/A 06/03/2018   Procedure: LAPAROSCOPIC VENTRAL WALL  HERNIA  REPAIR WITH MESH  ERAS PATHWAY;  Surgeon: Candyce Champagne, MD;  Location: [facility] ORS;  Service: General;  Laterality: N/A;    Family History  Problem Relation Age of Onset   Stroke Mother    Aneurysm Mother        brain   Arthritis Father    Liver cancer Father        liver, atomic testing in military   Gallbladder disease Father    Hyperlipidemia Sister    Hypertension Sister    Obesity Sister    Diabetes Sister    Diabetes Brother    Hyperlipidemia Sister    Hypertension Sister    Obesity Sister    Diabetes Sister    Aneurysm Brother        brain   COPD Brother    Gallbladder disease Sister        x4   Colon cancer Neg Hx    Colon polyps Neg Hx    Esophageal cancer Neg Hx    Rectal cancer Neg Hx    Stomach cancer Neg Hx    Prostate cancer Neg Hx    CAD Neg Hx     Social History   Socioeconomic History   Marital status: Married    Spouse name: Not on file   Number of children: 1   Years of education: Not on file   Highest education level: 12th grade  Occupational History   Occupation: Retired    Associate Professor: [employer]   Tobacco Use   Smoking status: Never   Smokeless tobacco: Never  Vaping Use   Vaping status: Never Used  Substance and Sexual Activity   Alcohol use: Not Currently    Comment: Occassionally   Drug use: No   Sexual activity: Yes  Other Topics Concern   Not on file  Social History Narrative   Married - 1 child   Retired from [employer]   Some EtOH, no tbacco/drug use   Social Drivers of Corporate investment banker Strain: Low Risk  (05/05/2023)   Overall Financial Resource Strain (CARDIA)    Difficulty of Paying Living Expenses: Not hard at all  Food Insecurity: No  Food Insecurity (11/16/2023)   Hunger Vital Sign    Worried About Running Out of Food in the Last Year: Never true    Ran Out of Food in the Last Year: Never true  Transportation Needs: No Transportation Needs (11/16/2023)   PRAPARE - Administrator, Civil Service (Medical): No    Lack of Transportation (Non-Medical): No  Physical Activity: Insufficiently Active (11/16/2023)   Exercise Vital Sign    Days of Exercise per Week: 3 days    Minutes of Exercise per Session: 40 min  Stress: Stress Concern Present (11/16/2023)   [employer] of Occupational Health - Occupational Stress Questionnaire    Feeling of Stress : To some extent  Social Connections: Socially Integrated (11/16/2023)   Social Connection and Isolation Panel [NHANES]    Frequency of Communication with Friends and Family: More than three times a week    Frequency of Social Gatherings with Friends and Family: Three times a week    Attends Religious Services: More than 4 times per year    Active Member of Clubs or Organizations: Yes    Attends Banker Meetings: 1 to 4 times per year    Marital Status: Married  Catering manager Violence: Not At Risk (08/12/2023)   Humiliation, Afraid, Rape, and Kick questionnaire    Fear of Current or Ex-Partner: No    Emotionally Abused: No    Physically Abused: No    Sexually Abused: No     Outpatient Medications Prior to Visit  Medication Sig Dispense Refill   acetaminophen  (TYLENOL ) 325 MG tablet Take 1-2 tablets (325-650 mg total) by mouth every 6 (six) hours as needed for mild pain (pain score 1-3 or temp > 100.5). 45 tablet 0   amLODipine  (NORVASC ) 10 MG tablet TAKE 1 TABLET DAILY 90 tablet 3   aspirin  81 MG chewable tablet Chew 1 tablet (81 mg total) by mouth 2 (two) times daily. 60 tablet 0   benazepril  (LOTENSIN ) 40 MG tablet Take 20 mg by mouth daily.     Cholecalciferol 25 MCG (1000 UT) capsule Take 1,000 Units by mouth daily.     Iron , Ferrous Sulfate , 325 (65 Fe) MG TABS Take 325 mg by mouth daily. 30 tablet 1   losartan  (COZAAR ) 50 MG tablet Take 1 tablet (50 mg total) by mouth daily. 90 tablet 3   metoprolol  succinate (TOPROL -XL) 50 MG 24 hr tablet TAKE AS INSTRUCTED BY YOUR PRESCRIBER (Patient taking differently: Take 50 mg by mouth daily.) 90 tablet 3   Multiple Vitamins-Minerals (MULTIVITAMIN ADULT PO) Take 2 tablets by mouth daily.     rosuvastatin  (CRESTOR ) 20 MG tablet Take 1 tablet (20 mg total) by mouth daily. 90 tablet 1   sildenafil  (VIAGRA ) 100 MG tablet Take 100 mg by mouth as needed for erectile dysfunction (1 hour prior to intercourse).     tamsulosin  (FLOMAX ) 0.4 MG CAPS capsule Take 0.4 mg by mouth daily.     traMADol  (ULTRAM ) 50 MG tablet Take 50 mg by mouth every 6 (six) hours as needed for moderate pain (pain score 4-6).     ALPRAZolam  (XANAX ) 0.5 MG tablet Take 0.5 mg by mouth at bedtime as needed for anxiety or sleep.     hydrocortisone  (ANUSOL -HC) 25 MG suppository Place 25 mg rectally 2 (two) times daily as needed for anal itching.     No facility-administered medications prior to visit.    No Known Allergies  Review of Systems  Constitutional:  Positive for  malaise/fatigue. Negative for fever.  HENT:  Negative for congestion.   Eyes:  Negative for blurred vision.  Respiratory:  Negative for shortness of breath.   Cardiovascular:   Negative for chest pain, palpitations and leg swelling.  Gastrointestinal:  Negative for abdominal pain, blood in stool and nausea.  Genitourinary:  Negative for dysuria and frequency.  Musculoskeletal:  Positive for joint pain. Negative for falls.  Skin:  Negative for rash.  Neurological:  Negative for dizziness, loss of consciousness and headaches.  Endo/Heme/Allergies:  Negative for environmental allergies.  Psychiatric/Behavioral:  Negative for depression. The patient is nervous/anxious and has insomnia.        Objective:     Physical Exam Vitals reviewed.  Constitutional:      Appearance: Normal appearance. He is not ill-appearing.  HENT:     Head: Normocephalic and atraumatic.     Nose: Nose normal.  Eyes:     Conjunctiva/sclera: Conjunctivae normal.  Cardiovascular:     Rate and Rhythm: Normal rate.     Pulses: Normal pulses.     Heart sounds: Murmur heard.  Pulmonary:     Effort: Pulmonary effort is normal.     Breath sounds: Normal breath sounds. No wheezing.  Abdominal:     Palpations: Abdomen is soft. There is no mass.     Tenderness: There is no abdominal tenderness.  Musculoskeletal:     Cervical back: Normal range of motion.     Right lower leg: No edema.     Left lower leg: No edema.  Skin:    General: Skin is warm and dry.  Neurological:     General: No focal deficit present.     Mental Status: He is alert and oriented to person, place, and time.  Psychiatric:        Mood and Affect: Mood normal.    BP 122/84   Pulse 70   Resp 16   Ht 5\' 10"  (1.778 m)   Wt 201 lb 3.2 oz (91.3 kg)   SpO2 97%   BMI 28.87 kg/m  Wt Readings from Last 3 Encounters:  11/16/23 201 lb 3.2 oz (91.3 kg)  09/04/23 192 lb 6.4 oz (87.3 kg)  08/28/23 207 lb 7.3 oz (94.1 kg)    Diabetic Foot Exam - Simple   No data filed    Lab Results  Component Value Date   WBC 6.9 09/04/2023   HGB 10.7 (L) 09/04/2023   HCT 33.9 (L) 09/04/2023   PLT 554 (H) 09/04/2023   GLUCOSE  100 (H) 09/04/2023   CHOL 110 05/14/2023   TRIG 93 08/24/2023   HDL 44.20 05/14/2023   LDLCALC 48 05/14/2023   ALT 63 (H) 09/04/2023   AST 31 09/04/2023   NA 140 09/04/2023   K 4.6 09/04/2023   CL 104 09/04/2023   CREATININE 0.87 09/04/2023   BUN 11 09/04/2023   CO2 28 09/04/2023   TSH 0.83 05/14/2023   PSA 1.08 08/07/2014   HGBA1C 6.0 05/14/2023    Lab Results  Component Value Date   TSH 0.83 05/14/2023   Lab Results  Component Value Date   WBC 6.9 09/04/2023   HGB 10.7 (L) 09/04/2023   HCT 33.9 (L) 09/04/2023   MCV 86.5 09/04/2023   PLT 554 (H) 09/04/2023   Lab Results  Component Value Date   NA 140 09/04/2023   K 4.6 09/04/2023   CO2 28 09/04/2023   GLUCOSE 100 (H) 09/04/2023   BUN 11 09/04/2023   CREATININE  0.87 09/04/2023   BILITOT 0.5 09/04/2023   ALKPHOS 85 08/28/2023   AST 31 09/04/2023   ALT 63 (H) 09/04/2023   PROT 7.0 09/04/2023   ALBUMIN  1.8 (L) 08/28/2023   CALCIUM  8.9 09/04/2023   ANIONGAP 7 08/28/2023   GFR 76.39 08/11/2023   Lab Results  Component Value Date   CHOL 110 05/14/2023   Lab Results  Component Value Date   HDL 44.20 05/14/2023   Lab Results  Component Value Date   LDLCALC 48 05/14/2023   Lab Results  Component Value Date   TRIG 93 08/24/2023   Lab Results  Component Value Date   CHOLHDL 2 05/14/2023   Lab Results  Component Value Date   HGBA1C 6.0 05/14/2023       Assessment & Plan:  Hyperglycemia Assessment & Plan: hgba1c acceptable, minimize simple carbs. Increase exercise as tolerated.   Orders: -     Hemoglobin A1c -     High sensitivity CRP  Hyperlipidemia, unspecified hyperlipidemia type Assessment & Plan: Tolerating statin, encouraged heart healthy diet, avoid trans fats, minimize simple carbs and saturated fats. Increase exercise as tolerated  Orders: -     TSH -     Lipid panel -     High sensitivity CRP  Primary hypertension Assessment & Plan: Well controlled, no changes to meds.  Encouraged heart healthy diet such as the DASH diet and exercise as tolerated.    Orders: -     TSH -     CBC with Differential/Platelet -     High sensitivity CRP  Constipation, unspecified constipation type Assessment & Plan: Uses Miralax  and stool softener and keeps the bowels moving. Uses Suppositories and MOM as needed with good results  Orders: -     CBC with Differential/Platelet -     High sensitivity CRP  Elevated C-reactive protein (CRP) -     CBC with Differential/Platelet -     High sensitivity CRP  Protein malnutrition (HCC) Assessment & Plan: Increase protein in diet  Orders: -     High sensitivity CRP -     Comprehensive metabolic panel with GFR  Abdominal pain, unspecified abdominal location Assessment & Plan: S/p partial small bowel resection after obstruction. Bowels moving comfortably and his energy is improving.   Orders: -     High sensitivity CRP  Need for pneumococcal 20-valent conjugate vaccination -     Pneumococcal conjugate vaccine 20-valent  Other orders -     ALPRAZolam ; Take 1 tablet (0.5 mg total) by mouth at bedtime as needed for anxiety or sleep.  Dispense: 30 tablet; Refill: 1 -     Hydrocortisone  Acetate; Place 1 suppository (25 mg total) rectally 2 (two) times daily as needed for anal itching.  Dispense: 12 suppository; Refill: 5    Assessment and Plan Assessment & Plan       Randie Bustle, MD

## 2023-11-16 NOTE — Assessment & Plan Note (Signed)
Increase protein in diet

## 2023-11-17 ENCOUNTER — Ambulatory Visit: Payer: Self-pay | Admitting: Family Medicine

## 2023-11-17 LAB — TSH: TSH: 1 u[IU]/mL (ref 0.35–5.50)

## 2024-01-06 ENCOUNTER — Other Ambulatory Visit: Payer: Self-pay | Admitting: Family Medicine

## 2024-02-22 ENCOUNTER — Ambulatory Visit: Admitting: Dermatology

## 2024-03-14 DIAGNOSIS — Z23 Encounter for immunization: Secondary | ICD-10-CM | POA: Diagnosis not present

## 2024-03-21 DIAGNOSIS — H04123 Dry eye syndrome of bilateral lacrimal glands: Secondary | ICD-10-CM | POA: Diagnosis not present

## 2024-03-21 DIAGNOSIS — H43813 Vitreous degeneration, bilateral: Secondary | ICD-10-CM | POA: Diagnosis not present

## 2024-03-28 ENCOUNTER — Other Ambulatory Visit: Payer: Self-pay | Admitting: Family Medicine

## 2024-04-04 ENCOUNTER — Other Ambulatory Visit (INDEPENDENT_AMBULATORY_CARE_PROVIDER_SITE_OTHER): Payer: Self-pay

## 2024-04-04 ENCOUNTER — Encounter: Payer: Self-pay | Admitting: Surgical

## 2024-04-04 ENCOUNTER — Ambulatory Visit: Admitting: Surgical

## 2024-04-04 DIAGNOSIS — Z96651 Presence of right artificial knee joint: Secondary | ICD-10-CM | POA: Diagnosis not present

## 2024-04-04 DIAGNOSIS — M542 Cervicalgia: Secondary | ICD-10-CM | POA: Diagnosis not present

## 2024-04-04 MED ORDER — MELOXICAM 15 MG PO TABS: 15.0000 mg | ORAL_TABLET | Freq: Every day | ORAL | 2 refills | Status: DC

## 2024-04-04 NOTE — Progress Notes (Signed)
 Office Visit Note   Patient: [name]           Date of Birth: 1945/11/10           MRN: [id number] Visit Date: 04/04/2024 Requested by: Domenica Harlene LABOR, MD [address] [place],  [place] [address] PCP: Domenica Harlene LABOR, MD  Subjective: Chief Complaint  Patient presents with   Right Knee - Pain    HPI: Alexsandro Salek is a 78 y.o. male who presents to the office reporting Right knee discomfort.  Patient has history of right total knee arthroplasty on 07/24/2022.  He has done well from the surgery aside from pain and stiffness on occasion.  He will have aching pain at night from time to time.  No fevers or chills.  Has occasional swelling.  Never really interferes with his ability to swing a golf club.  Pain is worse with stairs.  Takes Advil  and Tylenol .  Has previously taken meloxicam  which was more helpful for him than Advil .  No groin pain or radicular pain.  Has global knee pain.  No change in the appearance of the incision.  Also reports neck discomfort primarily in the medial trapezius region as well as the axial cervical spine with radiation into the occipital region.  He has no radicular symptoms in either arm with no numbness or tingling or radiating pain.  Does have some continued shoulder pain in both shoulders despite bilateral shoulder arthroplasty.              ROS: All systems reviewed are negative as they relate to the chief complaint within the history of present illness.  Patient denies fevers or chills.  Assessment & Plan: Visit Diagnoses:  1. S/P total knee arthroplasty, right   2. Neck pain     Plan: Impression is 78 year old male with right knee pain despite prior total knee arthroplasty.  He is about 2 years out almost and did explain that some patients despite having radiographs that appear excellent, can still experience continued pain after surgery at times.  Patient understands.  Will try to prescribe meloxicam  for him to take along with Tylenol   when he experiences pain.  Does not interfere with his main hobby which is golf which is good.  We also discussed other potential modalities to help with his pain such as working with a physical therapist or trying topical medications.  We also looked at his neck with radiographs demonstrating some mild to moderate degenerative changes at multiple levels.  He does have some restriction to his cervical spine range of motion but no radicular pain.  Think that the best option for him in terms of what to do about his neck would be to work with the physical therapist for neck, scapular stretching exercises.  He will reach out to the [facility] for this but if he needs a referral from us , I am happy to provide this.  Follow-up as needed  Follow-Up Instructions: No follow-ups on file.   Orders:  Orders Placed This Encounter  Procedures   XR Knee 1-2 Views Right   XR Cervical Spine 2 or 3 views   No orders of the defined types were placed in this encounter.     Procedures: No procedures performed   Clinical Data: No additional findings.  Objective: Vital Signs: There were no vitals taken for this visit.  Physical Exam:  Constitutional: Patient appears well-developed HEENT:  Head: Normocephalic Eyes:EOM are normal Neck: Normal range of  motion Cardiovascular: Normal rate Pulmonary/chest: Effort normal Neurologic: Patient is alert Skin: Skin is warm Psychiatric: Patient has normal mood and affect  Ortho Exam: Ortho exam demonstrates right knee with 0 degrees extension and 115 degrees of knee flexion.  No effusion.  Mild tenderness over the medial and lateral femoral condyles.  No calf tenderness.  Negative Homans' sign.  Excellent quad and hamstring strength.  No pain with hip range of motion.  No restriction to hip range of motion.  Stable to varus valgus stress at 0 and 30 degrees.  No gross mid flexion instability.  No warmth.  Incision is well-healed.  No evidence of infection.  No sinus tract.   Palpable pedal pulses.  Specialty Comments:  No specialty comments available.  Imaging: No results found.   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   Arthritis of right knee 07/27/2022   S/P total knee arthroplasty, right 07/24/2022   Shoulder arthritis    S/P shoulder replacement, 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   Skin tags, multiple acquired 07/07/2019   LUQ pain 07/07/2019   OA (osteoarthritis) of knee 03/16/2019   Ventral hernia s/p lap repair with mesh 06/03/2018 06/03/2018   Hemorrhoids 08/22/2016   Anemia 03/16/2016   Right shoulder pain 02/26/2016   Abnormal CT scan, gastrointestinal tract suspect angioedema    Abdominal pain    Hyperglycemia 08/21/2013   Low back pain 10/10/2012   Hyperlipidemia    Hypertension, essential     Obesity (BMI 30.0-34.9)    Past Medical History:  Diagnosis Date   Abnormal CT scan, gastrointestinal tract suspect angioedema    Anemia 03/16/2016   IRON    Anxiety    Arthritis    Back pain 10/10/2012   Fatty liver 2005   seen on 2005 ultrasound,05/2015 CT.    Hemorrhoids 08/22/2016   Hemorrhoids, internal, with bleeding 2005   internal and external. 2005 band ligation (dr Zulema in [facility])   Hyperglycemia 08/21/2013   Hyperlipidemia    Hypertension    Nocturia 08/21/2013   Obesity (BMI 30.0-34.9)    Osteoporosis    Pneumonia    as a child   Pre-diabetes    Rectal bleeding 07/30/2014   Renal cyst    Left Kidney   SBO (small bowel obstruction) (HCC)    Tubular adenoma of colon before 2005, 2012,  08/2014   Unspecified constipation 02/06/2014   Vitamin D deficiency     Family History  Problem Relation Age of Onset   Stroke Mother    Aneurysm Mother        brain   Arthritis Father    Liver cancer Father        liver, atomic testing in military   Gallbladder disease Father    Hyperlipidemia Sister    Hypertension Sister    Obesity Sister    Diabetes Sister    Diabetes Brother    Hyperlipidemia Sister    Hypertension Sister    Obesity Sister    Diabetes Sister    Aneurysm Brother        brain   COPD Brother  Gallbladder disease Sister        x4   Colon cancer Neg Hx    Colon polyps Neg Hx    Esophageal cancer Neg Hx    Rectal cancer Neg Hx    Stomach cancer Neg Hx    Prostate cancer Neg Hx    CAD Neg Hx     Past Surgical History:  Procedure Laterality Date   ANAL ANASTOMOSIS N/A 08/22/2023   Procedure: BOWEL REANASTOMOSIS;  Surgeon: Vernetta Berg, MD;  Location: [facility] OR;  Service: General;  Laterality: N/A;   APPLICATION OF WOUND VAC  08/20/2023   Procedure: APPLICATION OF ABTHERA WOUND VAC;  Surgeon: Paola Dreama SAILOR, MD;  [facility] OR;  Service: General;;   BICEPT TENODESIS Right 03/12/2023   Procedure: BICEPS TENODESIS;  Surgeon: Addie Cordella Hamilton, MD;  Location[facility] OR;  Service: Orthopedics;  Laterality: Right;   CATARACT EXTRACTION W/ INTRAOCULAR LENS IMPLANT Bilateral    COLONOSCOPY  before 2005, 2012, 2013, 08/2014.    ENTEROSCOPY N/A 10/05/2017   Procedure: ENTEROSCOPY;  Surgeon: Avram Lupita BRAVO, MD;  [facility]: [facility];  Service: Endoscopy;  Laterality: N/A;   FRACTURE SURGERY Left 1989   leg   HEMORRHOID BANDING  2005   IR RADIOLOGIST EVAL & MGMT  11/11/2021   KNEE SURGERY Left 2010   arthroscopy, torn carilage   KNEE SURGERY Right 2012   arthroscopy   LAPAROTOMY N/A 08/20/2023   Procedure: EXPLORATORY LAPAROTOMY, SMALL  BOWEL RESECTION;  Surgeon: Paola Dreama SAILOR, MD;  Location: [facility] OR;  Service: General;  Laterality: N/A;   LAPAROTOMY N/A  08/22/2023   Procedure: EXPLORATORY LAPAROTOMY WITH CLOSURE OF OPEN ABDOMEN;  Surgeon: Vernetta Berg, MD;  Location[facility] OR;  Service: General;  Laterality: N/A;   REVERSE SHOULDER ARTHROPLASTY Right 03/12/2023   Procedure: REVERSE SHOULDER ARTHROPLASTY;  Surgeon: Addie Cordella Hamilton, MD;  Location: [facility] OR;  Service: Orthopedics;  Laterality: Right;   TONSILLECTOMY  1957   TOTAL KNEE ARTHROPLASTY Right 07/24/2022   Procedure: RIGHT TOTAL KNEE ARTHROPLASTY;  Surgeon: Addie Cordella Hamilton, MD;  Location: [facility] OR;  Service: Orthopedics;  Laterality: Right;   TOTAL SHOULDER ARTHROPLASTY Left 11/01/2020   Procedure: LEFT ANATOMIC SHOULDER REPLACEMENT;  Surgeon: Addie Cordella Hamilton, MD;  Location: [facility] OR;  Service: Orthopedics;  Laterality: Left;   VENTRAL HERNIA REPAIR N/A 06/03/2018   Procedure: LAPAROSCOPIC VENTRAL WALL  HERNIA  REPAIR WITH MESH  ERAS PATHWAY;  Surgeon: Sheldon Standing, MD;  Location: [facility];  Service: General;  Laterality: N/A;   Social History   Occupational History   Occupation: Retired    Associate Professor: [employer]  Tobacco Use   Smoking status: Never   Smokeless tobacco: Never  Vaping Use   Vaping status: Never Used  Substance and Sexual Activity   Alcohol use: Not Currently    Comment: Occassionally   Drug use: No   Sexual activity: Yes

## 2024-04-05 ENCOUNTER — Other Ambulatory Visit: Payer: Self-pay | Admitting: Family Medicine

## 2024-04-25 DIAGNOSIS — N401 Enlarged prostate with lower urinary tract symptoms: Secondary | ICD-10-CM | POA: Diagnosis not present

## 2024-05-02 ENCOUNTER — Encounter: Payer: Self-pay | Admitting: Radiology

## 2024-05-02 DIAGNOSIS — N5201 Erectile dysfunction due to arterial insufficiency: Secondary | ICD-10-CM | POA: Diagnosis not present

## 2024-05-02 DIAGNOSIS — N401 Enlarged prostate with lower urinary tract symptoms: Secondary | ICD-10-CM | POA: Diagnosis not present

## 2024-05-02 DIAGNOSIS — R3915 Urgency of urination: Secondary | ICD-10-CM | POA: Diagnosis not present

## 2024-05-10 ENCOUNTER — Ambulatory Visit: Payer: Medicare Other

## 2024-05-10 VITALS — BP 120/60 | HR 69 | Temp 97.7°F | Ht 70.0 in | Wt 213.6 lb

## 2024-05-10 DIAGNOSIS — Z0000 Encounter for general adult medical examination without abnormal findings: Secondary | ICD-10-CM | POA: Diagnosis not present

## 2024-05-10 NOTE — Patient Instructions (Addendum)
 [name],  Thank you for taking the time for your Medicare Wellness Visit. I appreciate your continued commitment to your health goals. Please review the care plan we discussed, and feel free to reach out if I can assist you further.  Please note that Annual Wellness Visits do not include a physical exam. Some assessments may be limited, especially if the visit was conducted virtually. If needed, we may recommend an in-person follow-up with your provider.  Ongoing Care Seeing your primary care provider every 3 to 6 months helps us  monitor your health and provide consistent, personalized care.    Referrals If a referral was made during today's visit and you haven't received any updates within two weeks, please contact the referred provider directly to check on the status.  Recommended Screenings:  Health Maintenance  Topic Date Due   COVID-19 Vaccine (9 - Pfizer risk 2025-26 season) 09/19/2024   Medicare Annual Wellness Visit  05/10/2025   DTaP/Tdap/Td vaccine (3 - Td or Tdap) 12/05/2030   Pneumococcal Vaccine for age over 40  Completed   Flu Shot  Completed   Hepatitis C Screening  Completed   Zoster (Shingles) Vaccine  Completed   Meningitis B Vaccine  Aged Out   Colon Cancer Screening  Discontinued       05/10/2024    1:31 PM  Advanced Directives  Does Patient Have a Medical Advance Directive? Yes  Type of Estate Agent of [place];Living will  Does patient want to make changes to medical advance directive? No - Patient declined  Copy of Healthcare Power of Attorney in Chart? No - copy requested    Vision: Annual vision screenings are recommended for early detection of glaucoma, cataracts, and diabetic retinopathy. These exams can also reveal signs of chronic conditions such as diabetes and high blood pressure.  Dental: Annual dental screenings help detect early signs of oral cancer, gum disease, and other conditions linked to overall health, including  heart disease and diabetes.  Please see the attached documents for additional preventive care recommendations.

## 2024-05-10 NOTE — Progress Notes (Cosign Needed Addendum)
 Subjective:   [name] is a 78 y.o. male who presents for a Welcome to Medicare Exam.   Allergies (verified) Patient has no known allergies.   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    Hemorrhoids 08/22/2016   Hemorrhoids, internal, with bleeding 2005   internal and external. 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, 2012, 08/2014   Unspecified constipation 02/06/2014   Vitamin D deficiency    Past Surgical History:  Procedure Laterality Date   ANAL ANASTOMOSIS N/A 08/22/2023   Procedure: BOWEL REANASTOMOSIS;  Surgeon: Vernetta Berg, MD;  Location: [facility] OR;  Service: General;  Laterality: N/A;   APPLICATION OF WOUND VAC  08/20/2023   Procedure: APPLICATION OF ABTHERA WOUND VAC;  Surgeon: Paola Dreama SAILOR, MD;  Location: [facility] OR;  Service: General;;   BICEPT TENODESIS Right 03/12/2023   Procedure: BICEPS TENODESIS;  Surgeon: Addie Cordella Hamilton, MD;  Location: [facility] OR;  Service: Orthopedics;  Laterality: Right;   CATARACT EXTRACTION W/ INTRAOCULAR LENS IMPLANT Bilateral    COLONOSCOPY  before 2005, 2012, 2013, 08/2014.    ENTEROSCOPY N/A 10/05/2017   Procedure: ENTEROSCOPY;  Surgeon: Avram Lupita BRAVO, MD;  Location: [facility] ENDOSCOPY;  Service: Endoscopy;  Laterality: N/A;   FRACTURE SURGERY Left 1989   leg   HEMORRHOID BANDING  2005   IR RADIOLOGIST EVAL & MGMT  11/11/2021   KNEE SURGERY Left 2010   arthroscopy, torn carilage   KNEE SURGERY Right 2012   arthroscopy    LAPAROTOMY N/A 08/20/2023   Procedure: EXPLORATORY LAPAROTOMY, SMALL  BOWEL RESECTION;  Surgeon: Paola Dreama SAILOR, MD;  Location: [facility] OR;  Service: General;  Laterality: N/A;   LAPAROTOMY N/A 08/22/2023   Procedure: EXPLORATORY LAPAROTOMY WITH CLOSURE OF OPEN ABDOMEN;  Surgeon: Vernetta Berg, MD;  Location: [facility] OR;  Service: General;  Laterality: N/A;   REVERSE SHOULDER ARTHROPLASTY Right 03/12/2023   Procedure: REVERSE SHOULDER ARTHROPLASTY;  Surgeon: Addie Cordella Hamilton, MD;  Location: [facility] OR;  Service: Orthopedics;  Laterality: Right;   TONSILLECTOMY  1957   TOTAL KNEE ARTHROPLASTY Right 07/24/2022   Procedure: RIGHT TOTAL KNEE ARTHROPLASTY;  Surgeon: Addie Cordella Hamilton, MD;  Location: [facility] OR;  Service: Orthopedics;  Laterality: Right;   TOTAL SHOULDER ARTHROPLASTY Left 11/01/2020   Procedure: LEFT ANATOMIC SHOULDER REPLACEMENT;  Surgeon: Addie Cordella Hamilton, MD;  Location: [facility] OR;  Service: Orthopedics;  Laterality: Left;   VENTRAL HERNIA REPAIR N/A 06/03/2018   Procedure: LAPAROSCOPIC VENTRAL WALL  HERNIA  REPAIR WITH MESH  ERAS PATHWAY;  Surgeon: Sheldon Standing, MD;  Location: [facility] ORS;  Service: General;  Laterality: N/A;   Family History  Problem Relation Age of Onset   Stroke Mother    Aneurysm Mother        brain   Arthritis Father    Liver cancer Father  liver, atomic testing in [employer]   Gallbladder disease Father    Hyperlipidemia Sister    Hypertension Sister    Obesity Sister    Diabetes Sister    Diabetes Brother    Hyperlipidemia Sister    Hypertension Sister    Obesity Sister    Diabetes Sister    Aneurysm Brother        brain   COPD Brother    Gallbladder disease Sister        x4   Colon cancer Neg Hx    Colon polyps Neg Hx    Esophageal cancer Neg Hx    Rectal cancer Neg Hx    Stomach cancer Neg Hx    Prostate cancer Neg Hx    CAD Neg Hx    Social History   Occupational History   Occupation: Retired    Associate Professor: [employer]  Tobacco  Use   Smoking status: Never   Smokeless tobacco: Never  Vaping Use   Vaping status: Never Used  Substance and Sexual Activity   Alcohol use: Not Currently    Comment: Occassionally   Drug use: No   Sexual activity: Yes   Tobacco Counseling Counseling given: Not Answered  SDOH Screenings   Food Insecurity: No Food Insecurity (05/10/2024)  Recent Concern: Food Insecurity - Food Insecurity Present (05/09/2024)  Housing: Low Risk  (05/10/2024)  Transportation Needs: No Transportation Needs (05/10/2024)  Utilities: Not At Risk (05/10/2024)  Alcohol Screen: Low Risk  (11/16/2023)  Depression (PHQ2-9): Low Risk  (05/10/2024)  Financial Resource Strain: Low Risk  (05/09/2024)  Physical Activity: Sufficiently Active (05/10/2024)  Social Connections: Unknown (05/10/2024)  Stress: No Stress Concern Present (05/10/2024)  Tobacco Use: Low Risk  (05/10/2024)  Health Literacy: Adequate Health Literacy (05/10/2024)   Depression Screen    05/10/2024    1:37 PM 05/14/2023    1:13 PM 05/05/2023   11:48 AM 11/11/2022   11:14 AM 04/24/2022    9:41 AM 05/06/2021    8:02 AM 04/22/2021    9:50 AM  PHQ 2/9 Scores  PHQ - 2 Score 0 0 0 0 0 0 0  PHQ- 9 Score    0         Data saved with a previous flowsheet row definition     Goals Addressed               This Visit's Progress     Remain active (pt-stated)         Visit info / Clinical Intake: Medicare Wellness Visit Type:: Subsequent Annual Wellness Visit Persons participating in visit:: patient Medicare Wellness Visit Mode:: In-person (required for WTM) Information given by:: patient Interpreter Needed?: No Pre-visit prep was completed: no AWV questionnaire completed by patient prior to visit?: no Living arrangements:: lives with spouse/significant other Patient's Overall Health Status Rating: good Typical amount of pain: none Does pain affect daily life?: no Are you currently prescribed opioids?: no  Dietary Habits and  Nutritional Risks How many meals a day?: 2 Eats fruit and vegetables daily?: yes Most meals are obtained by: preparing own meals In the last 2 weeks, have you had any of the following?: none Diabetic:: no  Functional Status Activities of Daily Living (to include ambulation/medication): Independent Ambulation: Independent with device- listed below Home Assistive Devices/Equipment: Eyeglasses Medication Administration: Independent Home Management: Independent Manage your own finances?: yes Primary transportation is: driving Concerns about vision?: no *vision screening is required for WTM* Concerns about hearing?: no  Fall Screening Falls  in the past year?: 0 Number of falls in past year: 0 Was there an injury with Fall?: 0 Fall Risk Category Calculator: 0 Patient Fall Risk Level: Low Fall Risk  Fall Risk Patient at Risk for Falls Due to: No Fall Risks Fall risk Follow up: Falls evaluation completed  Home and Transportation Safety: All rugs have non-skid backing?: N/A, no rugs All stairs or steps have railings?: yes Grab bars in the bathtub or shower?: (!) no Have non-skid surface in bathtub or shower?: yes Good home lighting?: yes Regular seat belt use?: yes Hospital stays in the last year:: (!) yes How many hospital stays:: other (!7 days) Reason: Stomach surgery  Cognitive Assessment Difficulty concentrating, remembering, or making decisions? : no Will 6CIT or Mini Cog be Completed: no 6CIT or Mini Cog Declined: patient alert, oriented, able to answer questions appropriately and recall recent events  Advance Directives (For Healthcare) Does Patient Have a Medical Advance Directive?: Yes Does patient want to make changes to medical advance directive?: No - Patient declined Type of Advance Directive: Healthcare Power of [place]; Living will Copy of Healthcare Power of Attorney in Chart?: No - copy requested Copy of Living Will in Chart?: No - copy requested Would  patient like information on creating a medical advance directive?: No - Patient declined  Reviewed/Updated  Reviewed/Updated: Reviewed All (Medical, Surgical, Family, Medications, Allergies, Care Teams, Patient Goals)         Objective:    Today's Vitals   05/10/24 1323  BP: 120/60  Pulse: 69  Temp: 97.7 F (36.5 C)  TempSrc: Oral  SpO2: 98%  Weight: 213 lb 9.6 oz (96.9 kg)  Height: 5' 10 (1.778 m)   Body mass index is 30.65 kg/m.   Physical Exam   Current Medications (verified) Outpatient Encounter Medications as of 05/10/2024  Medication Sig   acetaminophen  (TYLENOL ) 325 MG tablet Take 1-2 tablets (325-650 mg total) by mouth every 6 (six) hours as needed for mild pain (pain score 1-3 or temp > 100.5).   ALPRAZolam  (XANAX ) 0.5 MG tablet Take 1 tablet (0.5 mg total) by mouth at bedtime as needed for anxiety or sleep.   amLODipine  (NORVASC ) 10 MG tablet TAKE 1 TABLET DAILY   aspirin  81 MG chewable tablet Chew 1 tablet (81 mg total) by mouth 2 (two) times daily.   benazepril  (LOTENSIN ) 40 MG tablet Take 20 mg by mouth daily.   Cholecalciferol 25 MCG (1000 UT) capsule Take 1,000 Units by mouth daily.   hydrocortisone  (ANUSOL -HC) 25 MG suppository Place 1 suppository (25 mg total) rectally 2 (two) times daily as needed for anal itching.   Iron , Ferrous Sulfate , 325 (65 Fe) MG TABS Take 325 mg by mouth daily.   losartan  (COZAAR ) 50 MG tablet Take 1 tablet (50 mg total) by mouth daily.   meloxicam  (MOBIC ) 15 MG tablet Take 1 tablet (15 mg total) by mouth daily.   metoprolol  succinate (TOPROL -XL) 50 MG 24 hr tablet TAKE 1 TABLET DAILY   Multiple Vitamins-Minerals (MULTIVITAMIN ADULT PO) Take 2 tablets by mouth daily.   rosuvastatin  (CRESTOR ) 20 MG tablet Take 1 tablet (20 mg total) by mouth daily.   sildenafil  (VIAGRA ) 100 MG tablet Take 100 mg by mouth as needed for erectile dysfunction (1 hour prior to intercourse).   tamsulosin  (FLOMAX ) 0.4 MG CAPS capsule Take 0.4 mg by mouth  daily.   traMADol  (ULTRAM ) 50 MG tablet Take 50 mg by mouth every 6 (six) hours as needed for moderate pain (pain  score 4-6). (Patient not taking: Reported on 05/10/2024)   No facility-administered encounter medications on file as of 05/10/2024.   Hearing/Vision screen Hearing Screening - Comments:: Denies hearing difficulties   Vision Screening - Comments:: Wears rx glasses - up to date with routine eye exams with  [facility] Immunizations and Health Maintenance Health Maintenance  Topic Date Due   COVID-19 Vaccine (9 - Pfizer risk 2025-26 season) 09/19/2024   Medicare Annual Wellness (AWV)  05/10/2025   DTaP/Tdap/Td (3 - Td or Tdap) 12/05/2030   Pneumococcal Vaccine: 50+ Years  Completed   Influenza Vaccine  Completed   Hepatitis C Screening  Completed   Zoster Vaccines- Shingrix  Completed   Meningococcal B Vaccine  Aged Out   Colonoscopy  Discontinued         Assessment/Plan:  This is a routine wellness examination for Tejuan.  Patient Care Team: Domenica Harlene LABOR, MD as PCP - General (Family Medicine) Sheldon Standing, MD as Consulting Physician (General Surgery) Burnetta Aures, MD as Consulting Physician (Orthopedic Surgery) Avram Lupita BRAVO, MD as Consulting Physician (Gastroenterology)  I have personally reviewed and noted the following in the patient's chart:   Medical and social history Use of alcohol, tobacco or illicit drugs  Current medications and supplements including opioid prescriptions. Functional ability and status Nutritional status Physical activity Advanced directives List of other physicians Hospitalizations, surgeries, and ER visits in previous 12 months Vitals Screenings to include cognitive, depression, and falls Referrals and appointments  No orders of the defined types were placed in this encounter.  In addition, I have reviewed and discussed with patient certain preventive protocols, quality metrics, and best practice recommendations. A  written personalized care plan for preventive services as well as general preventive health recommendations were provided to patient.   Rojelio LELON Blush, LPN   [id number]   Return in 1 year (on 05/10/2025).

## 2024-05-18 DIAGNOSIS — F419 Anxiety disorder, unspecified: Secondary | ICD-10-CM | POA: Insufficient documentation

## 2024-05-18 DIAGNOSIS — Z79899 Other long term (current) drug therapy: Secondary | ICD-10-CM | POA: Insufficient documentation

## 2024-05-18 DIAGNOSIS — N182 Chronic kidney disease, stage 2 (mild): Secondary | ICD-10-CM | POA: Insufficient documentation

## 2024-05-18 DIAGNOSIS — R7303 Prediabetes: Secondary | ICD-10-CM | POA: Insufficient documentation

## 2024-05-18 NOTE — Assessment & Plan Note (Deleted)
Encouraged DASH diet, decrease po intake and increase exercise as tolerated. Needs 7-8 hours of sleep nightly. Avoid trans fats, eat small, frequent meals every 4-5 hours with lean proteins, complex carbs and healthy fats. Minimize simple carbs 

## 2024-05-18 NOTE — Assessment & Plan Note (Deleted)
 Uses Miralax and stool softener and keeps the bowels moving. Uses Suppositories and MOM as needed with good results

## 2024-05-18 NOTE — Assessment & Plan Note (Deleted)
 Well controlled, no changes to meds. Encouraged heart healthy diet such as the DASH diet and exercise as tolerated.

## 2024-05-18 NOTE — Assessment & Plan Note (Deleted)
 Managed with occasional use of Alprazolam . No current desire for additional intervention. -Continue current management with Alprazolam  as needed. Open to SSRIs if frequency of anxiety episodes increases.

## 2024-05-18 NOTE — Progress Notes (Deleted)
 Subjective:     Patient ID: [name], male    DOB: Nov 04, 1945, 78 y.o.   MRN: [id number]  No chief complaint on file.   HPI  Discussed the use of AI scribe software for clinical note transcription with the patient, who gave verbal consent to proceed.  Follows with: Orthopedics, dermatology  specialists?  HTN- amlodipine  10 mg daily, metoprolol  50 mg daily benazepril  40 mg daily Losartan  50 mg daily-------------------------- look above which 1 is he not taking cannot take ACE and ARB together???  Remove 1 from last??  HLD-rosuvastatin  20 mg daily  BPH Tamsulosin  0.4 mg dail  Patient denies fever, chills, SOB, CP, palpitations, dyspnea, edema, HA, vision changes, N/V/D, abdominal pain, urinary symptoms, rash, weight changes, and recent illness or hospitalizations.   History of Present Illness              There are no preventive care reminders to display for this patient.  Past Medical History:  Diagnosis Date   Abnormal CT scan, gastrointestinal tract suspect angioedema    Anemia 03/16/2016   IRON    Anxiety    Arthritis    Back pain 10/10/2012   Fatty liver 2005   seen on 2005 ultrasound,05/2015 CT.    Hemorrhoids 08/22/2016   Hemorrhoids, internal, with bleeding 2005   internal and external. 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, 2012, 08/2014   Unspecified constipation 02/06/2014   Vitamin D deficiency     Past Surgical History:  Procedure Laterality Date   ANAL ANASTOMOSIS N/A 08/22/2023   Procedure: BOWEL REANASTOMOSIS;  Surgeon: Vernetta Berg, MD;  Location: [facility] OR;  Service: General;  Laterality: N/A;   APPLICATION OF WOUND VAC   08/20/2023   Procedure: APPLICATION OF ABTHERA WOUND VAC;  Surgeon: Paola Dreama SAILOR, MD;  Location: [facility] OR;  Service: General;;   BICEPT TENODESIS Right 03/12/2023   Procedure: BICEPS TENODESIS;  Surgeon: Addie Cordella Hamilton, MD;  Location: [facility] OR;  Service: Orthopedics;  Laterality: Right;   CATARACT EXTRACTION W/ INTRAOCULAR LENS IMPLANT Bilateral    COLONOSCOPY  before 2005, 2012, 2013, 08/2014.    ENTEROSCOPY N/A 10/05/2017   Procedure: ENTEROSCOPY;  Surgeon: Avram Lupita BRAVO, MD;  Location: [facility];  Service: Endoscopy;  Laterality: N/A;   FRACTURE SURGERY Left 1989   leg   HEMORRHOID BANDING  2005   IR RADIOLOGIST EVAL & MGMT  11/11/2021   KNEE SURGERY Left 2010   arthroscopy, torn carilage   KNEE SURGERY Right 2012   arthroscopy   LAPAROTOMY N/A 08/20/2023   Procedure: EXPLORATORY LAPAROTOMY, SMALL  BOWEL RESECTION;  Surgeon: Paola Dreama SAILOR, MD;  Location: [facility] OR;  Service: General;  Laterality: N/A;   LAPAROTOMY N/A 08/22/2023   Procedure: EXPLORATORY LAPAROTOMY WITH CLOSURE OF OPEN ABDOMEN;  Surgeon: Vernetta Berg, MD;  Location: [facility] OR;  Service: General;  Laterality: N/A;   REVERSE SHOULDER ARTHROPLASTY Right 03/12/2023   Procedure: REVERSE SHOULDER ARTHROPLASTY;  Surgeon: Addie Cordella Hamilton, MD;  Location: [facility] OR;  Service: Orthopedics;  Laterality: Right;   TONSILLECTOMY  1957   TOTAL KNEE ARTHROPLASTY  Right 07/24/2022   Procedure: RIGHT TOTAL KNEE ARTHROPLASTY;  Surgeon: Addie Cordella Hamilton, MD;  Location: [facility] OR;  Service: Orthopedics;  Laterality: Right;   TOTAL SHOULDER ARTHROPLASTY Left 11/01/2020   Procedure: LEFT ANATOMIC SHOULDER REPLACEMENT;  Surgeon: Addie Cordella Hamilton, MD;  Location: [facility] OR;  Service: Orthopedics;  Laterality: Left;   VENTRAL HERNIA REPAIR N/A 06/03/2018   Procedure: LAPAROSCOPIC VENTRAL WALL  HERNIA  REPAIR WITH MESH  ERAS PATHWAY;  Surgeon: Sheldon Standing, MD;  Location: [facility] OR[facility];  Service: General;  Laterality: N/A;    Family History  Problem  Relation Age of Onset   Stroke Mother    Aneurysm Mother        brain   Arthritis Father    Liver cancer Father        liver, atomic testing in military   Gallbladder disease Father    Hyperlipidemia Sister    Hypertension Sister    Obesity Sister    Diabetes Sister    Diabetes Brother    Hyperlipidemia Sister    Hypertension Sister    Obesity Sister    Diabetes Sister    Aneurysm Brother        brain   COPD Brother    Gallbladder disease Sister        x4   Colon cancer Neg Hx    Colon polyps Neg Hx    Esophageal cancer Neg Hx    Rectal cancer Neg Hx    Stomach cancer Neg Hx    Prostate cancer Neg Hx    CAD Neg Hx     Social History   Socioeconomic History   Marital status: Married    Spouse name: Not on file   Number of children: 1   Years of education: Not on file   Highest education level: Associate degree: occupational, scientist, product/process development, or vocational program  Occupational History   Occupation: Retired    Associate Professor: [employer]  Tobacco Use   Smoking status: Never   Smokeless tobacco: Never  Vaping Use   Vaping status: Never Used  Substance and Sexual Activity   Alcohol use: Not Currently    Comment: Occassionally   Drug use: No   Sexual activity: Yes  Other Topics Concern   Not on file  Social History Narrative   Married - 1 child   Retired from [employer]   Some EtOH, no tbacco/drug use   Social Drivers of Corporate Investment Banker Strain: Low Risk  (05/09/2024)   Overall Financial Resource Strain (CARDIA)    Difficulty of Paying Living Expenses: Not hard at all  Food Insecurity: No Food Insecurity (05/10/2024)   Hunger Vital Sign    Worried About Running Out of Food in the Last Year: Never true    Ran Out of Food in the Last Year: Never true  Recent Concern: Food Insecurity - Food Insecurity Present (05/09/2024)   Hunger Vital Sign    Worried About Running Out of Food in the Last Year: Often true    Ran Out of Food in the Last Year: Never  true  Transportation Needs: No Transportation Needs (05/10/2024)   PRAPARE - Administrator, Civil Service (Medical): No    Lack of Transportation (Non-Medical): No  Physical Activity: Sufficiently Active (05/10/2024)   Exercise Vital Sign    Days of Exercise per Week: 5 days    Minutes of Exercise per Session: 60 min  Stress: No Stress Concern Present (05/10/2024)   [employer]  of Occupational Health - Occupational Stress Questionnaire    Feeling of Stress: Not at all  Social Connections: Unknown (05/10/2024)   Social Connection and Isolation Panel    Frequency of Communication with Friends and Family: Not on file    Frequency of Social Gatherings with Friends and Family: Once a week    Attends Religious Services: More than 4 times per year    Active Member of [employer] or Organizations: Yes    Attends Engineer, Structural: More than 4 times per year    Marital Status: Married  Catering Manager Violence: Not At Risk (05/10/2024)   Humiliation, Afraid, Rape, and Kick questionnaire    Fear of Current or Ex-Partner: No    Emotionally Abused: No    Physically Abused: No    Sexually Abused: No    Outpatient Medications Prior to Visit  Medication Sig Dispense Refill   acetaminophen  (TYLENOL ) 325 MG tablet Take 1-2 tablets (325-650 mg total) by mouth every 6 (six) hours as needed for mild pain (pain score 1-3 or temp > 100.5). 45 tablet 0   ALPRAZolam  (XANAX ) 0.5 MG tablet Take 1 tablet (0.5 mg total) by mouth at bedtime as needed for anxiety or sleep. 30 tablet 1   amLODipine  (NORVASC ) 10 MG tablet TAKE 1 TABLET DAILY 90 tablet 3   aspirin  81 MG chewable tablet Chew 1 tablet (81 mg total) by mouth 2 (two) times daily. 60 tablet 0   benazepril  (LOTENSIN ) 40 MG tablet Take 20 mg by mouth daily.     Cholecalciferol 25 MCG (1000 UT) capsule Take 1,000 Units by mouth daily.     hydrocortisone  (ANUSOL -HC) 25 MG suppository Place 1 suppository (25 mg total) rectally 2  (two) times daily as needed for anal itching. 12 suppository 5   Iron , Ferrous Sulfate , 325 (65 Fe) MG TABS Take 325 mg by mouth daily. 30 tablet 1   losartan  (COZAAR ) 50 MG tablet Take 1 tablet (50 mg total) by mouth daily. 90 tablet 3   meloxicam  (MOBIC ) 15 MG tablet Take 1 tablet (15 mg total) by mouth daily. 30 tablet 2   metoprolol  succinate (TOPROL -XL) 50 MG 24 hr tablet TAKE 1 TABLET DAILY 90 tablet 3   Multiple Vitamins-Minerals (MULTIVITAMIN ADULT PO) Take 2 tablets by mouth daily.     rosuvastatin  (CRESTOR ) 20 MG tablet Take 1 tablet (20 mg total) by mouth daily. 90 tablet 1   sildenafil  (VIAGRA ) 100 MG tablet Take 100 mg by mouth as needed for erectile dysfunction (1 hour prior to intercourse).     tamsulosin  (FLOMAX ) 0.4 MG CAPS capsule Take 0.4 mg by mouth daily.     traMADol  (ULTRAM ) 50 MG tablet Take 50 mg by mouth every 6 (six) hours as needed for moderate pain (pain score 4-6). (Patient not taking: Reported on 05/10/2024)     No facility-administered medications prior to visit.    No Known Allergies  ROS See HPI    Objective:    Physical Exam Vitals reviewed.  Constitutional:      General: He is not in acute distress.    Appearance: He is not toxic-appearing.  HENT:     Head: Normocephalic and atraumatic.     Mouth/Throat:     Mouth: Mucous membranes are moist.     Pharynx: Oropharynx is clear.  Eyes:     Pupils: Pupils are equal, round, and reactive to light.  Cardiovascular:     Rate and Rhythm: Normal rate and regular rhythm.  Pulses: Normal pulses.     Heart sounds: Normal heart sounds. No murmur heard. Pulmonary:     Effort: Pulmonary effort is normal. No respiratory distress.     Breath sounds: Normal breath sounds. No wheezing.  Musculoskeletal:        General: No swelling.     Cervical back: Neck supple.  Skin:    General: Skin is warm and dry.  Neurological:     General: No focal deficit present.     Mental Status: He is alert and oriented  to person, place, and time.  Psychiatric:        Mood and Affect: Mood normal.        Behavior: Behavior normal.        Thought Content: Thought content normal.        Judgment: Judgment normal.      There were no vitals taken for this visit. Wt Readings from Last 3 Encounters:  05/10/24 213 lb 9.6 oz (96.9 kg)  11/16/23 201 lb 3.2 oz (91.3 kg)  09/04/23 192 lb 6.4 oz (87.3 kg)       Assessment & Plan:   Problem List Items Addressed This Visit     Anxiety   Managed with occasional use of Alprazolam . No current desire for additional intervention. -Continue current management with Alprazolam  as needed. Open to SSRIs if frequency of anxiety episodes increases.      Constipation   Uses Miralax  and stool softener and keeps the bowels moving. Uses Suppositories and MOM as needed with good results       High risk medication use   UDS and contract updated      Hyperlipidemia   Tolerating statin, encouraged heart healthy diet, avoid trans fats, minimize simple carbs and saturated fats. Increase exercise as tolerated        Hypertension, essential  - Primary   Well controlled, no changes to meds. Encouraged heart healthy diet such as the DASH diet and exercise as tolerated.          Obesity (BMI 30.0-34.9)   Encouraged DASH diet, decrease po intake and increase exercise as tolerated. Needs 7-8 hours of sleep nightly. Avoid trans fats, eat small, frequent meals every 4-5 hours with lean proteins, complex carbs and healthy fats. Minimize simple carbs       Prediabetes   Last hgba1c acceptable, minimize simple carbs. Increase exercise as tolerated.        I am having Elza Burrs maintain his Multiple Vitamins-Minerals (MULTIVITAMIN ADULT PO), tamsulosin , aspirin , Iron  (Ferrous Sulfate ), acetaminophen , losartan , amLODipine , sildenafil , benazepril , Cholecalciferol, traMADol , ALPRAZolam , hydrocortisone , rosuvastatin , meloxicam , and metoprolol  succinate.  No orders of the  defined types were placed in this encounter.

## 2024-05-18 NOTE — Assessment & Plan Note (Deleted)
 Tolerating statin, encouraged heart healthy diet, avoid trans fats, minimize simple carbs and saturated fats. Increase exercise as tolerated

## 2024-05-18 NOTE — Assessment & Plan Note (Deleted)
 UDS and contract updated.

## 2024-05-18 NOTE — Assessment & Plan Note (Deleted)
 Last hgba1c acceptable, minimize simple carbs. Increase exercise as tolerated.

## 2024-05-19 ENCOUNTER — Ambulatory Visit: Admitting: Student

## 2024-05-19 DIAGNOSIS — E66811 Obesity, class 1: Secondary | ICD-10-CM

## 2024-05-19 DIAGNOSIS — E785 Hyperlipidemia, unspecified: Secondary | ICD-10-CM

## 2024-05-19 DIAGNOSIS — K5904 Chronic idiopathic constipation: Secondary | ICD-10-CM

## 2024-05-19 DIAGNOSIS — F419 Anxiety disorder, unspecified: Secondary | ICD-10-CM

## 2024-05-19 DIAGNOSIS — I10 Essential (primary) hypertension: Secondary | ICD-10-CM

## 2024-05-19 DIAGNOSIS — Z79899 Other long term (current) drug therapy: Secondary | ICD-10-CM

## 2024-05-19 DIAGNOSIS — R7303 Prediabetes: Secondary | ICD-10-CM

## 2024-05-23 ENCOUNTER — Ambulatory Visit: Admitting: Dermatology

## 2024-05-23 ENCOUNTER — Encounter: Payer: Self-pay | Admitting: Dermatology

## 2024-05-23 VITALS — BP 145/81 | HR 72

## 2024-05-23 DIAGNOSIS — R238 Other skin changes: Secondary | ICD-10-CM

## 2024-05-23 DIAGNOSIS — L299 Pruritus, unspecified: Secondary | ICD-10-CM

## 2024-05-23 DIAGNOSIS — I781 Nevus, non-neoplastic: Secondary | ICD-10-CM | POA: Diagnosis not present

## 2024-05-23 DIAGNOSIS — L821 Other seborrheic keratosis: Secondary | ICD-10-CM | POA: Diagnosis not present

## 2024-05-23 MED ORDER — TACROLIMUS 0.1 % EX OINT: TOPICAL_OINTMENT | CUTANEOUS | 6 refills | Status: AC | PRN

## 2024-05-23 NOTE — Progress Notes (Signed)
   New Patient Visit  Patient (and/or pt guardian) consented to the use of AI-assisted tools for note generation.    Subjective  [name] is a 78 y.o. male who presents for the following: Lesion on lip  Located at the lip that he  would like to have examined.  Patient reports the areas have been there for 7 months.  He reports the area is a little tender sometimes  Patient reports he has not previously been treated for these areas.  Patient denies Hx of bx. Patient denies family history of skin cancer(s).  The patient has spots, moles and lesions to be evaluated, some may be new or changing and the patient may have concern these could be cancer.  Patient would also like to discuss spots on chest and back that are itchy and pigmented He reports he has had them for 40 years  The following portions of the chart were reviewed this encounter and updated as appropriate: medications, allergies, medical history  Review of Systems:  No other skin or systemic complaints except as noted in HPI or Assessment and Plan.  Objective  Well appearing patient in no apparent distress; mood and affect are within normal limits.   A focused examination was performed of the following areas: Lip,Chest and Back  Relevant exam findings are noted in the Assessment and Plan.            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 Present for approximately six months with minimal change in size. No smoking or tobacco use. Potential for growth or darkening, but currently not problematic.  - Monitor for changes such as growth, darkening, or bleeding. - If problematic, will return for numbing and cauterization.  Seborrheic keratoses with pruritus Seborrheic keratoses present on chest, back, and face, with pruritus. Benign growths due to genetic predisposition and aging. Pruritus is  intermittent and widespread, with itching on the back and chest. Previous treatments with liquid nitrogen were ineffective. Discussed use of steroid cream for short-term relief and non-steroidal options for long-term management. He prefers non-steroidal treatment due to concerns about systemic effects and skin thinning.  - Prescribed tacrolimus  ointment to be mixed with CeraVe anti-itch cream. - Instructed to apply the mixture daily to affected areas. - Provided five to six refills of tacrolimus  ointment. - Scheduled annual follow-up to monitor for changes. PRURITUS   Related Medications tacrolimus  (PROTOPIC ) 0.1 % ointment Apply topically as needed.  Return in about 1 year (around 05/23/2025) for Spot check.  I, Lyle Cords, as acting as a neurosurgeon for [employer], DO .   Documentation: I have reviewed the above documentation for accuracy and completeness, and I agree with the above.  Delon Lenis, DO

## 2024-05-23 NOTE — Patient Instructions (Addendum)
 VISIT SUMMARY:  Today, you were seen for a spot on your lip and itching on your chest and back. We discussed your concerns and reviewed your family history of mouth cancer and skin conditions. We also talked about treatment options for your symptoms.  YOUR PLAN:  -VENOUS LAKE OF THE LIP:  A venous lake is a small, dark blue spot caused by dilated blood vessels. It has been present for about six months without significant changes.   We will monitor it for any growth, darkening, or bleeding. If it becomes problematic, we can treat it with numbing and cauterization.  -SEBORRHEIC KERATOSES WITH PRURITUS:  Seborrheic keratoses are non-cancerous skin growths that can cause itching. They are common with aging and genetic predisposition. You have these on your chest, back, and face.   For the itching, I have prescribed tacrolimus  ointment to be mixed with CeraVe anti-itch cream. Please apply this mixture daily to the affected areas. You have been given five to six refills of the ointment. We will follow up annually to monitor any changes.  INSTRUCTIONS:  Please monitor the spot on your lip for any changes such as growth, darkening, or bleeding. Apply the prescribed mixture of tacrolimus  ointment and CeraVe anti-itch cream daily to the affected areas on your chest, back, and face. You have multiple refills available for the ointment. We will see you again in one year to check on your progress.    Important Information  Due to recent changes in healthcare laws, you may see results of your pathology and/or laboratory studies on MyChart before the doctors have had a chance to review them. We understand that in some cases there may be results that are confusing or concerning to you. Please understand that not all results are received at the same time and often the doctors may need to interpret multiple results in order to provide you with the best plan of care or course of treatment. Therefore, we ask  that you please give us  2 business days to thoroughly review all your results before contacting the office for clarification. Should we see a critical lab result, you will be contacted sooner.   If You Need Anything After Your Visit  If you have any questions or concerns for your doctor, please call our main line at [phone] If no one answers, please leave a voicemail as directed and we will return your call as soon as possible. Messages left after 4 pm will be answered the following business day.   You may also send us  a message via MyChart. We typically respond to MyChart messages within 1-2 business days.  For prescription refills, please ask your pharmacy to contact our office. Our fax number is [phone].  If you have an urgent issue when the clinic is closed that cannot wait until the next business day, you can page your doctor at the number below.    Please note that while we do our best to be available for urgent issues outside of office hours, we are not available 24/7.   If you have an urgent issue and are unable to reach us , you may choose to seek medical care at your doctor's office, retail clinic, urgent care center, or emergency room.  If you have a medical emergency, please immediately call 911 or go to the emergency department. In the event of inclement weather, please call our main line at [phone] for an update on the status of any delays or closures.  Dermatology Medication Tips: Please  keep the boxes that topical medications come in in order to help keep track of the instructions about where and how to use these. Pharmacies typically print the medication instructions only on the boxes and not directly on the medication tubes.   If your medication is too expensive, please contact our office at [phone] or send us  a message through MyChart.   We are unable to tell what your co-pay for medications will be in advance as this is different depending on your  insurance coverage. However, we may be able to find a substitute medication at lower cost or fill out paperwork to get insurance to cover a needed medication.   If a prior authorization is required to get your medication covered by your insurance company, please allow us  1-2 business days to complete this process.  Drug prices often vary depending on where the prescription is filled and some pharmacies may offer cheaper prices.  The website www.goodrx.com contains coupons for medications through different pharmacies. The prices here do not account for what the cost may be with help from insurance (it may be cheaper with your insurance), but the website can give you the price if you did not use any insurance.  - You can print the associated coupon and take it with your prescription to the pharmacy.  - You may also stop by our office during regular business hours and pick up a GoodRx coupon card.  - If you need your prescription sent electronically to a different pharmacy, notify our office through [facility] or by phone at [phone]

## 2024-06-12 NOTE — Assessment & Plan Note (Signed)
 Hydrate and monitor

## 2024-06-12 NOTE — Assessment & Plan Note (Signed)
 Tolerating statin, encouraged heart healthy diet, avoid trans fats, minimize simple carbs and saturated fats. Increase exercise as tolerated

## 2024-06-12 NOTE — Assessment & Plan Note (Signed)
 Well controlled, no changes to meds. Encouraged heart healthy diet such as the DASH diet and exercise as tolerated.

## 2024-06-12 NOTE — Assessment & Plan Note (Signed)
 hgba1c acceptable, minimize simple carbs. Increase exercise as tolerated.

## 2024-06-12 NOTE — Progress Notes (Unsigned)
 Subjective:    Patient ID: [name], male    DOB: 1946-05-09, 78 y.o.   MRN: [id number]  No chief complaint on file.   HPI Discussed the use of AI scribe software for clinical note transcription with the patient, who gave verbal consent to proceed.  History of Present Illness [name] is a 78 year old male with degenerative disc disease and PTSD who presents with neck pain and sleep disturbances.  He experiences stiffness and aches, particularly in his neck, which he attributes to arthritis. The pain is described as a 'crook in my neck' that he cannot get rid of, located on the right side. He uses a roll-on topical analgesic and moist heat to manage the symptoms, which he finds helpful in loosening up the arthritis, especially in the mornings. He has a history of neck injuries from his youth, which he believes have contributed to his current condition. An orthopedic evaluation last year included an x-ray of the cervical spine.  He also experiences pain in his left hip and low back, which he suspects might be related to his sciatic nerve. A CT scan of his hip and back was performed last Thursday at the [facility] in [place], but he has not yet received the results.  He has a history of PTSD, which he attributes to his [place]. He experiences anxiety and occasional outbursts, which he tries to control. He has difficulty sleeping, often waking up after an hour and a half and then lying awake for two hours before falling back asleep. He occasionally uses alprazolam  to help manage his symptoms but is cautious about its use.  He is physically active, attempting to go to the gym three times a week and aims for a minimum of 4,000 steps a day. He has received his flu, COVID, RSV, and pneumonia vaccinations.    Past Medical History:  Diagnosis Date   Abnormal CT scan, gastrointestinal tract suspect angioedema    Anemia 03/16/2016   IRON    Anxiety    Arthritis    Back pain  10/10/2012   Fatty liver 2005   seen on 2005 ultrasound,05/2015 CT.    Hemorrhoids 08/22/2016   Hemorrhoids, internal, with bleeding 2005   internal and external. 2005 band ligation (dr Zulema in [facility])   Hyperglycemia 08/21/2013   Hyperlipidemia    Hypertension    Nocturia 08/21/2013   Obesity (BMI 30.0-34.9)    Osteoporosis    Pneumonia    as a child   Pre-diabetes    Rectal bleeding 07/30/2014   Renal cyst    Left Kidney   SBO (small bowel obstruction) (HCC)    Tubular adenoma of colon before 2005, 2012, 08/2014   Unspecified constipation 02/06/2014   Vitamin D deficiency     Past Surgical History:  Procedure Laterality Date   ANAL ANASTOMOSIS N/A 08/22/2023   Procedure: BOWEL REANASTOMOSIS;  Surgeon: Vernetta Berg, MD;  Location: [facility] OR;  Service: General;  Laterality: N/A;   APPLICATION OF WOUND VAC  08/20/2023   Procedure: APPLICATION OF ABTHERA WOUND VAC;  Surgeon: Paola Dreama SAILOR, MD;  Location: [facility] OR;  Service: General;;   BICEPT TENODESIS Right 03/12/2023   Procedure: BICEPS TENODESIS;  Surgeon: Addie Cordella Hamilton, MD;  Location: [facility] OR;  Service: Orthopedics;  Laterality: Right;   CATARACT EXTRACTION W/ INTRAOCULAR LENS IMPLANT Bilateral    COLONOSCOPY  before 2005, 2012, 2013, 08/2014.    ENTEROSCOPY N/A 10/05/2017   Procedure: ENTEROSCOPY;  Surgeon: Avram Pitts  E, MD;  Location: [facility];  Service: Endoscopy;  Laterality: N/A;   FRACTURE SURGERY Left 1989   leg   HEMORRHOID BANDING  2005   IR RADIOLOGIST EVAL & MGMT  11/11/2021   KNEE SURGERY Left 2010   arthroscopy, torn carilage   KNEE SURGERY Right 2012   arthroscopy   LAPAROTOMY N/A 08/20/2023   Procedure: EXPLORATORY LAPAROTOMY, SMALL  BOWEL RESECTION;  Surgeon: Paola Dreama SAILOR, MD;  Location: [facility] OR;  Service: General;  Laterality: N/A;   LAPAROTOMY N/A 08/22/2023   Procedure: EXPLORATORY LAPAROTOMY WITH CLOSURE OF OPEN ABDOMEN;  Surgeon: Vernetta Berg, MD;  Location: [facility] OR;  Service:  General;  Laterality: N/A;   REVERSE SHOULDER ARTHROPLASTY Right 03/12/2023   Procedure: REVERSE SHOULDER ARTHROPLASTY;  Surgeon: Addie Cordella Hamilton, MD;  Location: [facility] OR;  Service: Orthopedics;  Laterality: Right;   TONSILLECTOMY  1957   TOTAL KNEE ARTHROPLASTY Right 07/24/2022   Procedure: RIGHT TOTAL KNEE ARTHROPLASTY;  Surgeon: Addie Cordella Hamilton, MD;  Location: [facility] OR;  Service: Orthopedics;  Laterality: Right;   TOTAL SHOULDER ARTHROPLASTY Left 11/01/2020   Procedure: LEFT ANATOMIC SHOULDER REPLACEMENT;  Surgeon: Addie Cordella Hamilton, MD;  Location: [facility] OR;  Service: Orthopedics;  Laterality: Left;   VENTRAL HERNIA REPAIR N/A 06/03/2018   Procedure: LAPAROSCOPIC VENTRAL WALL  HERNIA  REPAIR WITH MESH  ERAS PATHWAY;  Surgeon: Sheldon Standing, MD;  Location: [facility] ORS;  Service: General;  Laterality: N/A;    Family History  Problem Relation Age of Onset   Stroke Mother    Aneurysm Mother        brain   Arthritis Father    Liver cancer Father        liver, atomic testing in military   Gallbladder disease Father    Hyperlipidemia Sister    Hypertension Sister    Obesity Sister    Diabetes Sister    Diabetes Brother    Hyperlipidemia Sister    Hypertension Sister    Obesity Sister    Diabetes Sister    Aneurysm Brother        brain   COPD Brother    Gallbladder disease Sister        x4   Colon cancer Neg Hx    Colon polyps Neg Hx    Esophageal cancer Neg Hx    Rectal cancer Neg Hx    Stomach cancer Neg Hx    Prostate cancer Neg Hx    CAD Neg Hx     Social History   Socioeconomic History   Marital status: Married    Spouse name: Not on file   Number of children: 1   Years of education: Not on file   Highest education level: Associate degree: occupational, scientist, product/process development, or vocational program  Occupational History   Occupation: Retired    Associate Professor: [employer]  Tobacco Use   Smoking status: Never   Smokeless tobacco: Never  Vaping Use   Vaping status: Never Used   Substance and Sexual Activity   Alcohol use: Not Currently    Comment: Occassionally   Drug use: No   Sexual activity: Yes  Other Topics Concern   Not on file  Social History Narrative   Married - 1 child   Retired from [employer]   Some EtOH, no tbacco/drug use   Social Drivers of Health   Tobacco Use: Low Risk (05/23/2024)   Patient History    Smoking Tobacco Use: Never    Smokeless Tobacco Use: Never  Passive Exposure: Not on file  Financial Resource Strain: Low Risk (05/09/2024)   Overall Financial Resource Strain (CARDIA)    Difficulty of Paying Living Expenses: Not hard at all  Food Insecurity: No Food Insecurity (05/10/2024)   Epic    Worried About Programme Researcher, Broadcasting/film/video in the Last Year: Never true    Ran Out of Food in the Last Year: Never true  Recent Concern: Food Insecurity - Food Insecurity Present (05/09/2024)   Epic    Worried About Programme Researcher, Broadcasting/film/video in the Last Year: Often true    Ran Out of Food in the Last Year: Never true  Transportation Needs: No Transportation Needs (05/10/2024)   Epic    Lack of Transportation (Medical): No    Lack of Transportation (Non-Medical): No  Physical Activity: Sufficiently Active (05/10/2024)   Exercise Vital Sign    Days of Exercise per Week: 5 days    Minutes of Exercise per Session: 60 min  Stress: No Stress Concern Present (05/10/2024)   [employer] of Occupational Health - Occupational Stress Questionnaire    Feeling of Stress: Not at all  Social Connections: Unknown (05/10/2024)   Social Connection and Isolation Panel    Frequency of Communication with Friends and Family: Not on file    Frequency of Social Gatherings with Friends and Family: Once a week    Attends Religious Services: More than 4 times per year    Active Member of Clubs or Organizations: Yes    Attends Banker Meetings: More than 4 times per year    Marital Status: Married  Catering Manager Violence: Not At Risk (05/10/2024)    Epic    Fear of Current or Ex-Partner: No    Emotionally Abused: No    Physically Abused: No    Sexually Abused: No  Depression (PHQ2-9): Low Risk (05/10/2024)   Depression (PHQ2-9)    PHQ-2 Score: 0  Alcohol Screen: Low Risk (11/16/2023)   Alcohol Screen    Last Alcohol Screening Score (AUDIT): 2  Housing: Low Risk (05/10/2024)   Epic    Unable to Pay for Housing in the Last Year: No    Number of Times Moved in the Last Year: 0    Homeless in the Last Year: No  Utilities: Not At Risk (05/10/2024)   Epic    Threatened with loss of utilities: No  Health Literacy: Adequate Health Literacy (05/10/2024)   B1300 Health Literacy    Frequency of need for help with medical instructions: Never    Outpatient Medications Prior to Visit  Medication Sig Dispense Refill   acetaminophen  (TYLENOL ) 325 MG tablet Take 1-2 tablets (325-650 mg total) by mouth every 6 (six) hours as needed for mild pain (pain score 1-3 or temp > 100.5). 45 tablet 0   ALPRAZolam  (XANAX ) 0.5 MG tablet Take 1 tablet (0.5 mg total) by mouth at bedtime as needed for anxiety or sleep. 30 tablet 1   amLODipine  (NORVASC ) 10 MG tablet TAKE 1 TABLET DAILY 90 tablet 3   aspirin  81 MG chewable tablet Chew 1 tablet (81 mg total) by mouth 2 (two) times daily. 60 tablet 0   benazepril  (LOTENSIN ) 40 MG tablet Take 20 mg by mouth daily.     Cholecalciferol 25 MCG (1000 UT) capsule Take 1,000 Units by mouth daily.     hydrocortisone  (ANUSOL -HC) 25 MG suppository Place 1 suppository (25 mg total) rectally 2 (two) times daily as needed for anal itching. 12 suppository 5  Iron , Ferrous Sulfate , 325 (65 Fe) MG TABS Take 325 mg by mouth daily. 30 tablet 1   losartan  (COZAAR ) 50 MG tablet Take 1 tablet (50 mg total) by mouth daily. 90 tablet 3   meloxicam  (MOBIC ) 15 MG tablet Take 1 tablet (15 mg total) by mouth daily. 30 tablet 2   metoprolol  succinate (TOPROL -XL) 50 MG 24 hr tablet TAKE 1 TABLET DAILY 90 tablet 3   Multiple  Vitamins-Minerals (MULTIVITAMIN ADULT PO) Take 2 tablets by mouth daily.     rosuvastatin  (CRESTOR ) 20 MG tablet Take 1 tablet (20 mg total) by mouth daily. 90 tablet 1   sildenafil  (VIAGRA ) 100 MG tablet Take 100 mg by mouth as needed for erectile dysfunction (1 hour prior to intercourse).     tacrolimus  (PROTOPIC ) 0.1 % ointment Apply topically as needed. 60 g 6   tamsulosin  (FLOMAX ) 0.4 MG CAPS capsule Take 0.4 mg by mouth daily.     traMADol  (ULTRAM ) 50 MG tablet Take 50 mg by mouth every 6 (six) hours as needed for moderate pain (pain score 4-6). (Patient not taking: Reported on 05/23/2024)     No facility-administered medications prior to visit.    Allergies[1]  Review of Systems  Constitutional:  Negative for fever and malaise/fatigue.  HENT:  Negative for congestion.   Eyes:  Negative for blurred vision.  Respiratory:  Negative for shortness of breath.   Cardiovascular:  Negative for chest pain, palpitations and leg swelling.  Gastrointestinal:  Negative for abdominal pain, blood in stool and nausea.  Genitourinary:  Negative for dysuria and frequency.  Musculoskeletal:  Positive for back pain and joint pain. Negative for falls.  Skin:  Negative for rash.  Neurological:  Negative for dizziness, loss of consciousness and headaches.  Endo/Heme/Allergies:  Negative for environmental allergies.  Psychiatric/Behavioral:  Negative for depression. The patient is nervous/anxious and has insomnia.        Objective:    Physical Exam Vitals reviewed.  Constitutional:      Appearance: Normal appearance. He is not ill-appearing.  HENT:     Head: Normocephalic and atraumatic.     Nose: Nose normal.  Eyes:     Conjunctiva/sclera: Conjunctivae normal.  Cardiovascular:     Rate and Rhythm: Normal rate.     Pulses: Normal pulses.     Heart sounds: Normal heart sounds. No murmur heard. Pulmonary:     Effort: Pulmonary effort is normal.     Breath sounds: Normal breath sounds. No  wheezing.  Abdominal:     Palpations: Abdomen is soft. There is no mass.     Tenderness: There is no abdominal tenderness.  Musculoskeletal:     Cervical back: Normal range of motion.     Right lower leg: No edema.     Left lower leg: No edema.  Skin:    General: Skin is warm and dry.  Neurological:     General: No focal deficit present.     Mental Status: He is alert and oriented to person, place, and time.  Psychiatric:        Mood and Affect: Mood normal.    There were no vitals taken for this visit. Wt Readings from Last 3 Encounters:  05/10/24 213 lb 9.6 oz (96.9 kg)  11/16/23 201 lb 3.2 oz (91.3 kg)  09/04/23 192 lb 6.4 oz (87.3 kg)    Diabetic Foot Exam - Simple   No data filed    Lab Results  Component Value Date   WBC  5.3 11/16/2023   HGB 12.7 (L) 11/16/2023   HCT 39.5 11/16/2023   PLT 178.0 11/16/2023   GLUCOSE 98 11/16/2023   CHOL 111 11/16/2023   TRIG 72.0 11/16/2023   HDL 49.20 11/16/2023   LDLCALC 47 11/16/2023   ALT 16 11/16/2023   AST 16 11/16/2023   NA 141 11/16/2023   K 4.3 11/16/2023   CL 107 11/16/2023   CREATININE 0.84 11/16/2023   BUN 11 11/16/2023   CO2 27 11/16/2023   TSH 1.00 11/16/2023   PSA 1.08 08/07/2014   HGBA1C 5.9 11/16/2023    Lab Results  Component Value Date   TSH 1.00 11/16/2023   Lab Results  Component Value Date   WBC 5.3 11/16/2023   HGB 12.7 (L) 11/16/2023   HCT 39.5 11/16/2023   MCV 83.7 11/16/2023   PLT 178.0 11/16/2023   Lab Results  Component Value Date   NA 141 11/16/2023   K 4.3 11/16/2023   CO2 27 11/16/2023   GLUCOSE 98 11/16/2023   BUN 11 11/16/2023   CREATININE 0.84 11/16/2023   BILITOT 0.6 11/16/2023   ALKPHOS 88 11/16/2023   AST 16 11/16/2023   ALT 16 11/16/2023   PROT 7.4 11/16/2023   ALBUMIN  4.5 11/16/2023   CALCIUM  9.6 11/16/2023   ANIONGAP 7 08/28/2023   GFR 85.17 11/16/2023   Lab Results  Component Value Date   CHOL 111 11/16/2023   Lab Results  Component Value Date   HDL  49.20 11/16/2023   Lab Results  Component Value Date   LDLCALC 47 11/16/2023   Lab Results  Component Value Date   TRIG 72.0 11/16/2023   Lab Results  Component Value Date   CHOLHDL 2 11/16/2023   Lab Results  Component Value Date   HGBA1C 5.9 11/16/2023       Assessment & Plan:  Primary hypertension Assessment & Plan: Well controlled, no changes to meds. Encouraged heart healthy diet such as the DASH diet and exercise as tolerated.     Hyperlipidemia, unspecified hyperlipidemia type Assessment & Plan: Tolerating statin, encouraged heart healthy diet, avoid trans fats, minimize simple carbs and saturated fats. Increase exercise as tolerated   Hyperglycemia Assessment & Plan: hgba1c acceptable, minimize simple carbs. Increase exercise as tolerated.    Chronic kidney disease (CKD), stage 2 Assessment & Plan: Hydrate and monitor      Assessment and Plan Assessment & Plan Post-traumatic stress disorder (PTSD) with associated anxiety and insomnia Chronic PTSD with associated anxiety and insomnia, exacerbated by aging. Experiences anxiety, difficulty sleeping, and occasional outbursts. Discussed the organic nature of PTSD symptoms and the role of the sympathetic nervous system. Explored non-pharmacological interventions and potential pharmacological options, including SSRIs, for managing symptoms. Emphasized the importance of support groups and the potential benefits of SSRIs in reducing hyper-responsiveness to triggers. - Consider SSRIs for PTSD management if desired. - Explore non-pharmacological interventions such as CBTI coach app for sleep improvement. - Continue alprazolam  as needed for anxiety, with caution regarding risks. - Consider L-tryptophan and magnesium  glycinate supplements for sleep support.  Cervical spondylosis with chronic neck pain Chronic neck pain due to cervical spondylosis, with degenerative changes and osteophytes. Pain exacerbated by  inactivity and relieved by moist heat and topical analgesics. Discussed the benefits of chair yoga for joint mobility and overall well-being. - Continue use of moist heat and topical analgesics for pain relief. - Incorporate chair yoga into routine for joint mobility and pain management.  General health maintenance Up to date on  vaccinations including flu, COVID, RSV, and pneumonia. Discussed the importance of maintaining a healthy lifestyle through diet, hydration, and exercise to support immune function. - Continue current vaccination schedule. - Maintain a healthy lifestyle with diet, hydration, and regular exercise.  Recording duration: 26 minutes     Harlene Horton, MD    [1] No Known Allergies

## 2024-06-13 ENCOUNTER — Encounter: Payer: Self-pay | Admitting: Family Medicine

## 2024-06-13 ENCOUNTER — Ambulatory Visit: Admitting: Family Medicine

## 2024-06-13 ENCOUNTER — Other Ambulatory Visit: Payer: Self-pay | Admitting: Family Medicine

## 2024-06-13 VITALS — BP 132/84 | HR 73 | Temp 97.6°F | Resp 16 | Ht 70.0 in | Wt 214.8 lb

## 2024-06-13 DIAGNOSIS — I10 Essential (primary) hypertension: Secondary | ICD-10-CM

## 2024-06-13 DIAGNOSIS — Z79899 Other long term (current) drug therapy: Secondary | ICD-10-CM | POA: Diagnosis not present

## 2024-06-13 DIAGNOSIS — E785 Hyperlipidemia, unspecified: Secondary | ICD-10-CM | POA: Diagnosis not present

## 2024-06-13 DIAGNOSIS — R739 Hyperglycemia, unspecified: Secondary | ICD-10-CM | POA: Diagnosis not present

## 2024-06-13 DIAGNOSIS — F419 Anxiety disorder, unspecified: Secondary | ICD-10-CM | POA: Diagnosis not present

## 2024-06-13 DIAGNOSIS — N182 Chronic kidney disease, stage 2 (mild): Secondary | ICD-10-CM | POA: Diagnosis not present

## 2024-06-13 LAB — COMPREHENSIVE METABOLIC PANEL WITH GFR
ALT: 18 U/L (ref 0–53)
AST: 18 U/L (ref 0–37)
Albumin: 4.6 g/dL (ref 3.5–5.2)
Alkaline Phosphatase: 77 U/L (ref 39–117)
BUN: 14 mg/dL (ref 6–23)
CO2: 29 meq/L (ref 19–32)
Calcium: 9.8 mg/dL (ref 8.4–10.5)
Chloride: 106 meq/L (ref 96–112)
Creatinine, Ser: 0.98 mg/dL (ref 0.40–1.50)
GFR: 75.01 mL/min (ref >60.00)
Glucose, Bld: 103 mg/dL — ABNORMAL HIGH (ref 70–99)
Potassium: 4.5 meq/L (ref 3.5–5.1)
Sodium: 140 meq/L (ref 135–145)
Total Bilirubin: 0.6 mg/dL (ref 0.2–1.2)
Total Protein: 7.2 g/dL (ref 6.0–8.3)

## 2024-06-13 LAB — LIPID PANEL
Cholesterol: 102 mg/dL (ref 0–200)
HDL: 47.1 mg/dL (ref >39.00)
LDL Cholesterol: 40 mg/dL (ref 0–99)
NonHDL: 55.15
Total CHOL/HDL Ratio: 2
Triglycerides: 77 mg/dL (ref 0.0–149.0)
VLDL: 15.4 mg/dL (ref 0.0–40.0)

## 2024-06-13 LAB — CBC WITH DIFFERENTIAL/PLATELET
Basophils Absolute: 0 K/uL (ref 0.0–0.1)
Basophils Relative: 0.3 % (ref 0.0–3.0)
Eosinophils Absolute: 0.4 K/uL (ref 0.0–0.7)
Eosinophils Relative: 8.4 % — ABNORMAL HIGH (ref 0.0–5.0)
HCT: 38.6 % — ABNORMAL LOW (ref 39.0–52.0)
Hemoglobin: 12.6 g/dL — ABNORMAL LOW (ref 13.0–17.0)
Lymphocytes Relative: 32.2 % (ref 12.0–46.0)
Lymphs Abs: 1.6 K/uL (ref 0.7–4.0)
MCHC: 32.8 g/dL (ref 30.0–36.0)
MCV: 83.9 fl (ref 78.0–100.0)
Monocytes Absolute: 0.5 K/uL (ref 0.1–1.0)
Monocytes Relative: 9.9 % (ref 3.0–12.0)
Neutro Abs: 2.4 K/uL (ref 1.4–7.7)
Neutrophils Relative %: 49.2 % (ref 43.0–77.0)
Platelets: 157 K/uL (ref 150.0–400.0)
RBC: 4.6 Mil/uL (ref 4.22–5.81)
RDW: 16.2 % — ABNORMAL HIGH (ref 11.5–15.5)
WBC: 4.9 K/uL (ref 4.0–10.5)

## 2024-06-13 LAB — HEMOGLOBIN A1C: Hgb A1c MFr Bld: 5.8 % (ref 4.6–6.5)

## 2024-06-13 LAB — TSH: TSH: 1.5 u[IU]/mL (ref 0.35–5.50)

## 2024-06-13 NOTE — Addendum Note (Signed)
 Addended by: ESTELLE GILLIS D on: 06/13/2024 10:39 AM   Modules accepted: Orders

## 2024-06-13 NOTE — Patient Instructions (Addendum)
 Chair Yoga   CBTinsomnia [employer] APP for sleep (CBTi coach)  LTryptophan capsules  Magnesium  Glycinate 200-400 mg at bedtime   Helight is a special red light   Insomnia Insomnia is a sleep disorder that makes it difficult to fall asleep or stay asleep. Insomnia can cause fatigue, low energy, difficulty concentrating, mood swings, and poor performance at work or school. There are three different ways to classify insomnia: Difficulty falling asleep. Difficulty staying asleep. Waking up too early in the morning. Any type of insomnia can be long-term (chronic) or short-term (acute). Both are common. Short-term insomnia usually lasts for 3 months or less. Chronic insomnia occurs at least three times a week for longer than 3 months. What are the causes? Insomnia may be caused by another condition, situation, or substance, such as: Having certain mental health conditions, such as anxiety and depression. Using caffeine, alcohol, tobacco, or drugs. Having gastrointestinal conditions, such as gastroesophageal reflux disease (GERD). Having certain medical conditions. These include: Asthma. Alzheimer's disease. Stroke. Chronic pain. An overactive thyroid  gland (hyperthyroidism). Other sleep disorders, such as restless legs syndrome and sleep apnea. Menopause. Sometimes, the cause of insomnia may not be known. What increases the risk? Risk factors for insomnia include: Gender. Females are affected more often than males. Age. Insomnia is more common as people get older. Stress and certain medical and mental health conditions. Lack of exercise. Having an irregular work schedule. This may include working night shifts and traveling between different time zones. What are the signs or symptoms? If you have insomnia, the main symptom is having trouble falling asleep or having trouble staying asleep. This may lead to other symptoms, such as: Feeling tired or having low  energy. Feeling nervous about going to sleep. Not feeling rested in the morning. Having trouble concentrating. Feeling irritable, anxious, or depressed. How is this diagnosed? This condition may be diagnosed based on: Your symptoms and medical history. Your health care provider may ask about: Your sleep habits. Any medical conditions you have. Your mental health. A physical exam. How is this treated? Treatment for insomnia depends on the cause. Treatment may focus on treating an underlying condition that is causing the insomnia. Treatment may also include: Medicines to help you sleep. Counseling or therapy. Lifestyle adjustments to help you sleep better. Follow these instructions at home: Eating and drinking  Limit or avoid alcohol, caffeinated beverages, and products that contain nicotine and tobacco, especially close to bedtime. These can disrupt your sleep. Do not eat a large meal or eat spicy foods right before bedtime. This can lead to digestive discomfort that can make it hard for you to sleep. Sleep habits  Keep a sleep diary to help you and your health care provider figure out what could be causing your insomnia. Write down: When you sleep. When you wake up during the night. How well you sleep and how rested you feel the next day. Any side effects of medicines you are taking. What you eat and drink. Make your bedroom a dark, comfortable place where it is easy to fall asleep. Put up shades or blackout curtains to block light from outside. Use a white noise machine to block noise. Keep the temperature cool. Limit screen use before bedtime. This includes: Not watching TV. Not using your smartphone, tablet, or computer. Stick to a routine that includes going to bed and waking up at the same times every day and night. This can help you fall asleep faster. Consider making a quiet activity, such  as reading, part of your nighttime routine. Try to avoid taking naps during the day  so that you sleep better at night. Get out of bed if you are still awake after 15 minutes of trying to sleep. Keep the lights down, but try reading or doing a quiet activity. When you feel sleepy, go back to bed. General instructions Take over-the-counter and prescription medicines only as told by your health care provider. Exercise regularly as told by your health care provider. However, avoid exercising in the hours right before bedtime. Use relaxation techniques to manage stress. Ask your health care provider to suggest some techniques that may work well for you. These may include: Breathing exercises. Routines to release muscle tension. Visualizing peaceful scenes. Make sure that you drive carefully. Do not drive if you feel very sleepy. Keep all follow-up visits. This is important. Contact a health care provider if: You are tired throughout the day. You have trouble in your daily routine due to sleepiness. You continue to have sleep problems, or your sleep problems get worse. Get help right away if: You have thoughts about hurting yourself or someone else. Get help right away if you feel like you may hurt yourself or others, or have thoughts about taking your own life. Go to your nearest emergency room or: Call 911. Call the National Suicide Prevention Lifeline at 631-652-9278 or 988. This is open 24 hours a day. Text the Crisis Text Line at (318)541-0547. Summary Insomnia is a sleep disorder that makes it difficult to fall asleep or stay asleep. Insomnia can be long-term (chronic) or short-term (acute). Treatment for insomnia depends on the cause. Treatment may focus on treating an underlying condition that is causing the insomnia. Keep a sleep diary to help you and your health care provider figure out what could be causing your insomnia. This information is not intended to replace advice given to you by your health care provider. Make sure you discuss any questions you have with your  health care provider. Document Revised: 05/27/2021 Document Reviewed: 05/27/2021 Elsevier Patient Education  2024 [employer].

## 2024-06-14 ENCOUNTER — Ambulatory Visit: Payer: Self-pay | Admitting: Family Medicine

## 2024-06-14 LAB — DRUG MONITORING, PANEL 8 WITH CONFIRMATION, URINE
6 Acetylmorphine: NEGATIVE ng/mL (ref <10)
Alcohol Metabolites: NEGATIVE ng/mL (ref <500)
Amphetamines: NEGATIVE ng/mL (ref <500)
Benzodiazepines: NEGATIVE ng/mL (ref <100)
Buprenorphine, Urine: NEGATIVE ng/mL (ref <5)
Cocaine Metabolite: NEGATIVE ng/mL (ref <150)
Creatinine: 33.8 mg/dL (ref >=20.0)
MDMA: NEGATIVE ng/mL (ref <500)
Marijuana Metabolite: NEGATIVE ng/mL (ref <20)
Opiates: NEGATIVE ng/mL (ref <100)
Oxidant: NEGATIVE ug/mL (ref <200)
Oxycodone: NEGATIVE ng/mL (ref <100)
pH: 6.4 (ref 4.5–9.0)

## 2024-06-14 LAB — DM TEMPLATE

## 2024-06-25 ENCOUNTER — Other Ambulatory Visit: Payer: Self-pay | Admitting: Surgical

## 2024-06-30 ENCOUNTER — Other Ambulatory Visit: Payer: Self-pay | Admitting: Family Medicine

## 2025-05-30 ENCOUNTER — Ambulatory Visit: Admitting: Dermatology
